# Patient Record
Sex: Female | Born: 1960 | Hispanic: Yes | Marital: Single | Smoking: Former smoker
Health system: Southern US, Community
[De-identification: ages and names within clinical notes are randomized; demographics above are authoritative.]

## PROBLEM LIST (undated history)

## (undated) DIAGNOSIS — Z9981 Dependence on supplemental oxygen: Secondary | ICD-10-CM

## (undated) DIAGNOSIS — N390 Urinary tract infection, site not specified: Secondary | ICD-10-CM

## (undated) DIAGNOSIS — J449 Chronic obstructive pulmonary disease, unspecified: Secondary | ICD-10-CM

## (undated) DIAGNOSIS — R112 Nausea with vomiting, unspecified: Secondary | ICD-10-CM

## (undated) DIAGNOSIS — I739 Peripheral vascular disease, unspecified: Secondary | ICD-10-CM

## (undated) DIAGNOSIS — R569 Unspecified convulsions: Secondary | ICD-10-CM

## (undated) DIAGNOSIS — M797 Fibromyalgia: Secondary | ICD-10-CM

## (undated) DIAGNOSIS — G8929 Other chronic pain: Secondary | ICD-10-CM

## (undated) DIAGNOSIS — Z978 Presence of other specified devices: Secondary | ICD-10-CM

## (undated) DIAGNOSIS — J8410 Pulmonary fibrosis, unspecified: Secondary | ICD-10-CM

## (undated) DIAGNOSIS — Z960 Presence of urogenital implants: Secondary | ICD-10-CM

## (undated) DIAGNOSIS — Z87442 Personal history of urinary calculi: Secondary | ICD-10-CM

## (undated) DIAGNOSIS — G43909 Migraine, unspecified, not intractable, without status migrainosus: Secondary | ICD-10-CM

## (undated) DIAGNOSIS — D649 Anemia, unspecified: Secondary | ICD-10-CM

## (undated) DIAGNOSIS — Z9889 Other specified postprocedural states: Secondary | ICD-10-CM

## (undated) DIAGNOSIS — G43019 Migraine without aura, intractable, without status migrainosus: Secondary | ICD-10-CM

## (undated) DIAGNOSIS — J189 Pneumonia, unspecified organism: Secondary | ICD-10-CM

## (undated) DIAGNOSIS — M329 Systemic lupus erythematosus, unspecified: Secondary | ICD-10-CM

## (undated) DIAGNOSIS — I509 Heart failure, unspecified: Secondary | ICD-10-CM

## (undated) DIAGNOSIS — N189 Chronic kidney disease, unspecified: Secondary | ICD-10-CM

## (undated) HISTORY — DX: Chronic obstructive pulmonary disease, unspecified: J44.9

## (undated) HISTORY — DX: Migraine without aura, intractable, without status migrainosus: G43.019

## (undated) HISTORY — PX: ABDOMINAL HYSTERECTOMY: SHX81

## (undated) HISTORY — PX: EYE SURGERY: SHX253

## (undated) HISTORY — PX: CHOLECYSTECTOMY: SHX55

## (undated) HISTORY — DX: Pulmonary fibrosis, unspecified: J84.10

---

## 2010-09-18 HISTORY — PX: OTHER SURGICAL HISTORY: SHX169

## 2013-09-18 HISTORY — PX: OTHER SURGICAL HISTORY: SHX169

## 2013-10-05 ENCOUNTER — Emergency Department (HOSPITAL_COMMUNITY): Payer: Medicaid Other

## 2013-10-05 ENCOUNTER — Inpatient Hospital Stay (HOSPITAL_COMMUNITY)
Admission: EM | Admit: 2013-10-05 | Discharge: 2013-10-10 | DRG: 871 | Disposition: A | Discharge status: Home / Self Care | Payer: Medicaid Other | Attending: Internal Medicine | Admitting: Internal Medicine

## 2013-10-05 ENCOUNTER — Other Ambulatory Visit: Payer: Self-pay

## 2013-10-05 ENCOUNTER — Encounter (HOSPITAL_COMMUNITY): Payer: Self-pay | Admitting: Emergency Medicine

## 2013-10-05 DIAGNOSIS — J15212 Pneumonia due to Methicillin resistant Staphylococcus aureus: Secondary | ICD-10-CM | POA: Diagnosis present

## 2013-10-05 DIAGNOSIS — Z87891 Personal history of nicotine dependence: Secondary | ICD-10-CM | POA: Diagnosis not present

## 2013-10-05 DIAGNOSIS — K5900 Constipation, unspecified: Secondary | ICD-10-CM | POA: Diagnosis present

## 2013-10-05 DIAGNOSIS — K219 Gastro-esophageal reflux disease without esophagitis: Secondary | ICD-10-CM | POA: Diagnosis present

## 2013-10-05 DIAGNOSIS — D72829 Elevated white blood cell count, unspecified: Secondary | ICD-10-CM | POA: Diagnosis present

## 2013-10-05 DIAGNOSIS — D638 Anemia in other chronic diseases classified elsewhere: Secondary | ICD-10-CM | POA: Diagnosis present

## 2013-10-05 DIAGNOSIS — A419 Sepsis, unspecified organism: Principal | ICD-10-CM | POA: Diagnosis present

## 2013-10-05 DIAGNOSIS — E876 Hypokalemia: Secondary | ICD-10-CM | POA: Diagnosis present

## 2013-10-05 DIAGNOSIS — I498 Other specified cardiac arrhythmias: Secondary | ICD-10-CM | POA: Diagnosis present

## 2013-10-05 DIAGNOSIS — R1310 Dysphagia, unspecified: Secondary | ICD-10-CM | POA: Diagnosis present

## 2013-10-05 DIAGNOSIS — G894 Chronic pain syndrome: Secondary | ICD-10-CM | POA: Diagnosis present

## 2013-10-05 DIAGNOSIS — R5381 Other malaise: Secondary | ICD-10-CM | POA: Diagnosis not present

## 2013-10-05 DIAGNOSIS — G8929 Other chronic pain: Secondary | ICD-10-CM

## 2013-10-05 DIAGNOSIS — Z23 Encounter for immunization: Secondary | ICD-10-CM

## 2013-10-05 DIAGNOSIS — R5383 Other fatigue: Secondary | ICD-10-CM | POA: Diagnosis present

## 2013-10-05 DIAGNOSIS — IMO0001 Reserved for inherently not codable concepts without codable children: Secondary | ICD-10-CM | POA: Diagnosis present

## 2013-10-05 DIAGNOSIS — R0902 Hypoxemia: Secondary | ICD-10-CM

## 2013-10-05 DIAGNOSIS — J9600 Acute respiratory failure, unspecified whether with hypoxia or hypercapnia: Secondary | ICD-10-CM | POA: Diagnosis present

## 2013-10-05 DIAGNOSIS — G40909 Epilepsy, unspecified, not intractable, without status epilepticus: Secondary | ICD-10-CM | POA: Diagnosis present

## 2013-10-05 DIAGNOSIS — Z79899 Other long term (current) drug therapy: Secondary | ICD-10-CM

## 2013-10-05 DIAGNOSIS — D509 Iron deficiency anemia, unspecified: Secondary | ICD-10-CM | POA: Diagnosis present

## 2013-10-05 DIAGNOSIS — J189 Pneumonia, unspecified organism: Secondary | ICD-10-CM | POA: Diagnosis present

## 2013-10-05 HISTORY — DX: Fibromyalgia: M79.7

## 2013-10-05 HISTORY — DX: Unspecified convulsions: R56.9

## 2013-10-05 HISTORY — DX: Other chronic pain: G89.29

## 2013-10-05 HISTORY — DX: Migraine, unspecified, not intractable, without status migrainosus: G43.909

## 2013-10-05 LAB — POCT I-STAT 3, ART BLOOD GAS (G3+)
Acid-base deficit: 4 mmol/L — ABNORMAL HIGH (ref 0.0–2.0)
Bicarbonate: 22.2 mEq/L (ref 20.0–24.0)
O2 SAT: 83 %
PCO2 ART: 42.7 mmHg (ref 35.0–45.0)
TCO2: 23 mmol/L (ref 0–100)
pH, Arterial: 7.324 — ABNORMAL LOW (ref 7.350–7.450)
pO2, Arterial: 51 mmHg — ABNORMAL LOW (ref 80.0–100.0)

## 2013-10-05 LAB — CBC WITH DIFFERENTIAL/PLATELET
BASOS PCT: 0 % (ref 0–1)
Basophils Absolute: 0 10*3/uL (ref 0.0–0.1)
EOS PCT: 0 % (ref 0–5)
Eosinophils Absolute: 0 10*3/uL (ref 0.0–0.7)
HCT: 34.3 % — ABNORMAL LOW (ref 36.0–46.0)
Hemoglobin: 10.9 g/dL — ABNORMAL LOW (ref 12.0–15.0)
Lymphocytes Relative: 4 % — ABNORMAL LOW (ref 12–46)
Lymphs Abs: 1.2 10*3/uL (ref 0.7–4.0)
MCH: 29.1 pg (ref 26.0–34.0)
MCHC: 31.8 g/dL (ref 30.0–36.0)
MCV: 91.7 fL (ref 78.0–100.0)
Monocytes Absolute: 1.5 10*3/uL — ABNORMAL HIGH (ref 0.1–1.0)
Monocytes Relative: 5 % (ref 3–12)
NEUTROS PCT: 91 % — AB (ref 43–77)
Neutro Abs: 25.3 10*3/uL — ABNORMAL HIGH (ref 1.7–7.7)
PLATELETS: 408 10*3/uL — AB (ref 150–400)
RBC: 3.74 MIL/uL — ABNORMAL LOW (ref 3.87–5.11)
RDW: 15.2 % (ref 11.5–15.5)
WBC: 28 10*3/uL — ABNORMAL HIGH (ref 4.0–10.5)

## 2013-10-05 LAB — POCT I-STAT TROPONIN I: Troponin i, poc: 0 ng/mL (ref 0.00–0.08)

## 2013-10-05 LAB — URINALYSIS W MICROSCOPIC + REFLEX CULTURE
Glucose, UA: NEGATIVE mg/dL
Hgb urine dipstick: NEGATIVE
KETONES UR: NEGATIVE mg/dL
LEUKOCYTES UA: NEGATIVE
NITRITE: NEGATIVE
Protein, ur: 30 mg/dL — AB
Specific Gravity, Urine: 1.024 (ref 1.005–1.030)
Urobilinogen, UA: 1 mg/dL (ref 0.0–1.0)
pH: 6 (ref 5.0–8.0)

## 2013-10-05 LAB — BASIC METABOLIC PANEL
BUN: 22 mg/dL (ref 6–23)
CALCIUM: 8.7 mg/dL (ref 8.4–10.5)
CO2: 25 mEq/L (ref 19–32)
Chloride: 101 mEq/L (ref 96–112)
Creatinine, Ser: 0.81 mg/dL (ref 0.50–1.10)
GFR, EST NON AFRICAN AMERICAN: 83 mL/min — AB (ref >90)
Glucose, Bld: 136 mg/dL — ABNORMAL HIGH (ref 70–99)
POTASSIUM: 4.6 meq/L (ref 3.7–5.3)
Sodium: 142 mEq/L (ref 137–147)

## 2013-10-05 LAB — PRO B NATRIURETIC PEPTIDE: PRO B NATRI PEPTIDE: 465.7 pg/mL — AB (ref 0–125)

## 2013-10-05 LAB — MRSA PCR SCREENING: MRSA BY PCR: NEGATIVE

## 2013-10-05 LAB — CG4 I-STAT (LACTIC ACID): LACTIC ACID, VENOUS: 2.71 mmol/L — AB (ref 0.5–2.2)

## 2013-10-05 MED ORDER — MORPHINE SULFATE 4 MG/ML IJ SOLN
4.0000 mg | INTRAMUSCULAR | Status: DC | PRN
  Administered 2013-10-05: 4 mg via INTRAVENOUS
  Filled 2013-10-05: qty 1

## 2013-10-05 MED ORDER — TRAMADOL HCL 50 MG PO TABS
50.0000 mg | ORAL_TABLET | Freq: Four times a day (QID) | ORAL | Status: DC | PRN
  Administered 2013-10-05 – 2013-10-10 (×13): 50 mg via ORAL
  Filled 2013-10-05 (×13): qty 1

## 2013-10-05 MED ORDER — NON FORMULARY: 10.0000 mg | Freq: Every day | Status: DC

## 2013-10-05 MED ORDER — PREGABALIN 50 MG PO CAPS
300.0000 mg | ORAL_CAPSULE | Freq: Three times a day (TID) | ORAL | Status: DC
  Administered 2013-10-05 – 2013-10-10 (×14): 300 mg via ORAL
  Filled 2013-10-05: qty 6
  Filled 2013-10-05: qty 12
  Filled 2013-10-05: qty 3
  Filled 2013-10-05 (×2): qty 6
  Filled 2013-10-05: qty 3
  Filled 2013-10-05: qty 6
  Filled 2013-10-05: qty 12
  Filled 2013-10-05 (×2): qty 3
  Filled 2013-10-05 (×2): qty 6
  Filled 2013-10-05 (×3): qty 3

## 2013-10-05 MED ORDER — SODIUM CHLORIDE 0.9 % IV SOLN: INTRAVENOUS | Status: DC

## 2013-10-05 MED ORDER — SODIUM CHLORIDE 0.9 % IV SOLN
INTRAVENOUS | Status: DC
  Administered 2013-10-05: 17:00:00 via INTRAVENOUS

## 2013-10-05 MED ORDER — PROMETHAZINE HCL 25 MG PO TABS: 12.5000 mg | ORAL_TABLET | Freq: Four times a day (QID) | ORAL | Status: DC | PRN

## 2013-10-05 MED ORDER — MORPHINE SULFATE 2 MG/ML IJ SOLN
1.0000 mg | INTRAMUSCULAR | Status: DC | PRN
  Administered 2013-10-05 – 2013-10-06 (×2): 1 mg via INTRAVENOUS
  Filled 2013-10-05 (×2): qty 1

## 2013-10-05 MED ORDER — FENTANYL 50 MCG/HR TD PT72
50.0000 ug | MEDICATED_PATCH | TRANSDERMAL | Status: DC
  Administered 2013-10-07 – 2013-10-10 (×2): 50 ug via TRANSDERMAL
  Filled 2013-10-05 (×2): qty 1

## 2013-10-05 MED ORDER — IPRATROPIUM BROMIDE 0.02 % IN SOLN
0.5000 mg | Freq: Once | RESPIRATORY_TRACT | Status: AC
  Administered 2013-10-05: 0.5 mg via RESPIRATORY_TRACT
  Filled 2013-10-05: qty 2.5

## 2013-10-05 MED ORDER — SODIUM CHLORIDE 0.9 % IV BOLUS (SEPSIS)
500.0000 mL | Freq: Once | INTRAVENOUS | Status: AC
  Administered 2013-10-05: 500 mL via INTRAVENOUS

## 2013-10-05 MED ORDER — HEPARIN SODIUM (PORCINE) 5000 UNIT/ML IJ SOLN
5000.0000 [IU] | Freq: Three times a day (TID) | INTRAMUSCULAR | Status: DC
  Administered 2013-10-05 – 2013-10-10 (×14): 5000 [IU] via SUBCUTANEOUS
  Filled 2013-10-05 (×17): qty 1

## 2013-10-05 MED ORDER — DOXYCYCLINE HYCLATE 100 MG PO TABS
100.0000 mg | ORAL_TABLET | Freq: Once | ORAL | Status: AC
  Administered 2013-10-05: 100 mg via ORAL
  Filled 2013-10-05: qty 1

## 2013-10-05 MED ORDER — ALBUTEROL SULFATE (2.5 MG/3ML) 0.083% IN NEBU: 2.5000 mg | INHALATION_SOLUTION | Freq: Three times a day (TID) | RESPIRATORY_TRACT | Status: DC

## 2013-10-05 MED ORDER — HYDROMORPHONE HCL PF 1 MG/ML IJ SOLN: 1.0000 mg | INTRAMUSCULAR | Status: DC | PRN

## 2013-10-05 MED ORDER — ALBUTEROL SULFATE (2.5 MG/3ML) 0.083% IN NEBU
2.5000 mg | INHALATION_SOLUTION | RESPIRATORY_TRACT | Status: DC
Start: 2013-10-05 — End: 2013-10-05
  Administered 2013-10-05: 2.5 mg via RESPIRATORY_TRACT
  Filled 2013-10-05: qty 3

## 2013-10-05 MED ORDER — DEXTROSE 5 % IV SOLN
1.0000 g | Freq: Once | INTRAVENOUS | Status: AC
  Administered 2013-10-05: 1 g via INTRAVENOUS
  Filled 2013-10-05: qty 10

## 2013-10-05 MED ORDER — SODIUM CHLORIDE 0.9 % IJ SOLN
3.0000 mL | Freq: Two times a day (BID) | INTRAMUSCULAR | Status: DC
  Administered 2013-10-06 – 2013-10-09 (×8): 3 mL via INTRAVENOUS

## 2013-10-05 MED ORDER — SODIUM CHLORIDE 0.9 % IV SOLN
INTRAVENOUS | Status: AC
  Administered 2013-10-06: 1000 mL via INTRAVENOUS

## 2013-10-05 MED ORDER — RIZATRIPTAN BENZOATE 10 MG PO TABS
10.0000 mg | ORAL_TABLET | Freq: Every day | ORAL | Status: DC | PRN
  Filled 2013-10-05: qty 1

## 2013-10-05 MED ORDER — RIZATRIPTAN BENZOATE 10 MG PO TBDP
10.0000 mg | ORAL_TABLET | Freq: Every day | ORAL | Status: DC | PRN
  Administered 2013-10-07: 10 mg via ORAL
  Filled 2013-10-05 (×4): qty 1

## 2013-10-05 MED ORDER — ALBUTEROL SULFATE (2.5 MG/3ML) 0.083% IN NEBU
5.0000 mg | INHALATION_SOLUTION | Freq: Once | RESPIRATORY_TRACT | Status: AC
  Administered 2013-10-05: 5 mg via RESPIRATORY_TRACT
  Filled 2013-10-05: qty 6

## 2013-10-05 MED ORDER — ALBUTEROL SULFATE (2.5 MG/3ML) 0.083% IN NEBU
2.5000 mg | INHALATION_SOLUTION | RESPIRATORY_TRACT | Status: DC | PRN
  Administered 2013-10-05 – 2013-10-06 (×2): 2.5 mg via RESPIRATORY_TRACT
  Filled 2013-10-05 (×2): qty 3

## 2013-10-05 NOTE — ED Notes (Signed)
Patient refused to stand for orthostatics, states she can not stand due to weakness that is why she is here.

## 2013-10-05 NOTE — ED Notes (Signed)
NOTIFIED DR. Clarene DukeMCMANUS IN PERSON OF PATIENTS LAB RESULTS OF CG4+ LACTIC ACID ,@16 :38 PM ,07/05/2014.

## 2013-10-05 NOTE — ED Notes (Signed)
Patient presents to ED via EMS from urgent care for fibromyalgia pain.

## 2013-10-05 NOTE — ED Provider Notes (Signed)
CSN: [id number]     Arrival date & time 10/05/13  1423 History   First MD Initiated Contact with Patient 10/05/13 1508     Chief Complaint  Patient presents with  . Fibromyalgia  . Generalized Body Aches    HPI Pt was seen at 1520. Per pt and her family, c/o gradual onset and worsening of persistent generalized body aches for the past 3 days. Pt states she has had "chills," with intermittent home temps to "100.2" and coughing.  States she has a significant hx of fibromyalgia, takes multiple meds for pain but "they aren't working." Denies any specific complaints otherwise. Pt was seen at a local [facility] then sent to the ED for further eval. Denies CP/palpitations, no SOB, no abd pain, no N/V/D, no sore throat, no neck pain, no rash, no focal motor weakness, no tingling/numbness in extremities.    Past Medical History  Diagnosis Date  . Fibromyalgia   . Chronic pain    History reviewed. No pertinent past surgical history.  History  Substance Use Topics  . Smoking status: Never Smoker   . Smokeless tobacco: Not on file  . Alcohol Use: No    Review of Systems ROS: Statement: All systems negative except as marked or noted in the HPI; Constitutional: Negative for fever and +chills, generalized body aches. ; ; Eyes: Negative for eye pain, redness and discharge. ; ; ENMT: Negative for ear pain, hoarseness, nasal congestion, sinus pressure and sore throat. ; ; Cardiovascular: Negative for chest pain, palpitations, diaphoresis, dyspnea and peripheral edema. ; ; Respiratory: +cough. Negative for wheezing and stridor. ; ; Gastrointestinal: Negative for nausea, vomiting, diarrhea, abdominal pain, blood in stool, hematemesis, jaundice and rectal bleeding. . ; ; Genitourinary: Negative for dysuria, flank pain and hematuria. ; ; Musculoskeletal: Negative for back pain and neck pain. Negative for swelling and trauma.; ; Skin: Negative for pruritus, rash, abrasions, blisters, bruising and skin lesion.; ;  Neuro: Negative for headache, lightheadedness and neck stiffness. Negative for weakness, altered level of consciousness , altered mental status, extremity weakness, paresthesias, involuntary movement, seizure and syncope.     Allergies  Shellfish allergy  Home Medications   Current Outpatient Rx  Name  Route  Sig  Dispense  Refill  . etodolac (LODINE) 500 MG tablet   Oral   Take 500 mg by mouth 3 (three) times daily.         . fentaNYL (DURAGESIC - DOSED MCG/HR) 50 MCG/HR   Transdermal   Place 50 mcg onto the skin every 3 (three) days.         . Oxcarbazepine (TRILEPTAL) 300 MG tablet   Oral   Take 300 mg by mouth 2 (two) times daily.         . pregabalin (LYRICA) 300 MG capsule   Oral   Take 300 mg by mouth 3 (three) times daily.         . rizatriptan (MAXALT) 10 MG tablet   Oral   Take 10 mg by mouth daily as needed for migraine. May repeat in 2 hours if needed         . traMADol (ULTRAM) 50 MG tablet   Oral   Take 50 mg by mouth every 6 (six) hours as needed for moderate pain.          BP 92/56  Pulse 103  Temp(Src) 98.4 F (36.9 C) (Oral)  Resp 14  SpO2 91% Physical Exam 1525: Physical examination:  Nursing notes reviewed;  Vital signs and O2 SAT reviewed;  Constitutional: Well developed, Well nourished, Well hydrated, In no acute distress; Head:  Normocephalic, atraumatic; Eyes: EOMI, PERRL, No scleral icterus; ENMT: Mouth and pharynx normal, Mucous membranes moist; Neck: Supple, Full range of motion, No lymphadenopathy; Cardiovascular: Regular rate and rhythm, No gallop; Respiratory: Breath sounds coarse & equal bilaterally, No wheezes.  Speaking full sentences with ease, Normal respiratory effort/excursion; Chest: Nontender, Movement normal; Abdomen: Soft, Nontender, Nondistended, Normal bowel sounds; Genitourinary: No CVA tenderness; Extremities: Pulses normal, No tenderness, No edema, No calf edema or asymmetry.; Neuro: AA&Ox3, Major CN grossly intact.  Speech clear. No gross focal motor or sensory deficits in extremities.; Skin: Color normal, Warm, Dry.   ED Course  Procedures    EKG Interpretation    Date/Time:  Sunday October 05 2013 16:50:25 EST Ventricular Rate:  100 PR Interval:  137 QRS Duration: 78 QT Interval:  397 QTC Calculation: 512 R Axis:   80 Text Interpretation:  Sinus tachycardia Borderline T wave abnormalities Prolonged QT interval No old tracing to compare Confirmed by [facility] SystemMCCMANUS  MD, Nicholos JohnsKATHLEEN [phone]) on 10/05/2013 5:06:27 PM            MDM  MDM Reviewed: previous chart, nursing note and vitals Interpretation: labs and x-ray   Results for orders placed during the hospital encounter of 10/05/13  URINALYSIS W MICROSCOPIC + REFLEX CULTURE      Result Value Range   Color, Urine YELLOW  YELLOW   APPearance CLEAR  CLEAR   Specific Gravity, Urine 1.024  1.005 - 1.030   pH 6.0  5.0 - 8.0   Glucose, UA NEGATIVE  NEGATIVE mg/dL   Hgb urine dipstick NEGATIVE  NEGATIVE   Bilirubin Urine LARGE (*) NEGATIVE   Ketones, ur NEGATIVE  NEGATIVE mg/dL   Protein, ur 30 (*) NEGATIVE mg/dL   Urobilinogen, UA 1.0  0.0 - 1.0 mg/dL   Nitrite NEGATIVE  NEGATIVE   Leukocytes, UA NEGATIVE  NEGATIVE   RBC / HPF 7-10  <3 RBC/hpf  CBC WITH DIFFERENTIAL      Result Value Range   WBC 28.0 (*) 4.0 - 10.5 K/uL   RBC 3.74 (*) 3.87 - 5.11 MIL/uL   Hemoglobin 10.9 (*) 12.0 - 15.0 g/dL   HCT 96.034.3 (*) 45.436.0 - 09.846.0 %   MCV 91.7  78.0 - 100.0 fL   MCH 29.1  26.0 - 34.0 pg   MCHC 31.8  30.0 - 36.0 g/dL   RDW 11.915.2  14.711.5 - 82.915.5 %   Platelets 408 (*) 150 - 400 K/uL   Neutrophils Relative % 91 (*) 43 - 77 %   Neutro Abs 25.3 (*) 1.7 - 7.7 K/uL   Lymphocytes Relative 4 (*) 12 - 46 %   Lymphs Abs 1.2  0.7 - 4.0 K/uL   Monocytes Relative 5  3 - 12 %   Monocytes Absolute 1.5 (*) 0.1 - 1.0 K/uL   Eosinophils Relative 0  0 - 5 %   Eosinophils Absolute 0.0  0.0 - 0.7 K/uL   Basophils Relative 0  0 - 1 %   Basophils Absolute 0.0  0.0 - 0.1  K/uL  BASIC METABOLIC PANEL      Result Value Range   Sodium 142  137 - 147 mEq/L   Potassium 4.6  3.7 - 5.3 mEq/L   Chloride 101  96 - 112 mEq/L   CO2 25  19 - 32 mEq/L   Glucose, Bld 136 (*) 70 - 99 mg/dL  BUN 22  6 - 23 mg/dL   Creatinine, Ser 6.57  0.50 - 1.10 mg/dL   Calcium 8.7  8.4 - 84.6 mg/dL   GFR calc non Af Amer 83 (*) >90 mL/min   GFR calc Af Amer >90  >90 mL/min  POCT I-STAT TROPONIN I      Result Value Range   Troponin i, poc 0.00  0.00 - 0.08 ng/mL   Comment 3           CG4 I-STAT (LACTIC ACID)      Result Value Range   Lactic Acid, Venous 2.71 (*) 0.5 - 2.2 mmol/L   Dg Chest 2 View 10/05/2013   CLINICAL DATA:  History of chest pain for 48 hr. And arthritis and fibroma myalgia  EXAM: CHEST  2 VIEW  COMPARISON:  None.  FINDINGS: The lungs are well-expanded. There are fluffy increased lung markings bilaterally but most conspicuously in the right upper lobe. The cardiopericardial silhouette is normal in size. The pulmonary vascularity is not engorged. There is no pleural effusion or pneumothorax. The mediastinum is normal in width. The observed portions of the bony thorax exhibit no acute abnormalities.  IMPRESSION: Increased interstitial and alveolar densities bilaterally but predominantly on the right are consistent with pneumonia. Certainly other alveolar filling process ease could be a present. Follow-up chest films or chest CT scanning following therapy will be needed to assure clearing.   Electronically Signed   By: David  [place]   On: 10/05/2013 16:33     1745:  Prolonged QT on EKG; will dose IV rocephin and doxycycline for CAP. O2 Sat 88% R/A, increases to 95% on O2 2.5L N/C. Remains afebrile. SBP dropped to 90's after IV morphine, will give IVF bolus. Pt continues to mentate per baseline, resps without distress. Dx and testing d/w pt and family.  Questions answered.  Verb understanding, agreeable to admit. T/C to [facility] Resident, case discussed, including:  HPI, pertinent  PM/SHx, VS/PE, dx testing, ED course and treatment:  Agreeable to admit, requests to write temporary orders, obtain stepdown bed to team Dr. Meredith Pel' service.      Laray Anger, DO 10/06/13 1249

## 2013-10-05 NOTE — H&P (Signed)
Date: 10/05/2013               Patient Name:  [name] [name] MRN: [id number]  DOB: 05/29/1961 Age / Sex: 53 y.o., female   PCP: No Pcp Per Patient         Medical Service: Internal Medicine Teaching Service         Attending Physician: Dr. Farley LyJerry Dale Joines, MD    First Contact: Dr. Otis BraceMarjan Rabbani, MD Pager: [phone]  Second Contact: Dr. Darden PalmerSamaya Qureshi, MD Pager: [phone]       After Hours (After 5p/  First Contact Pager: [phone]  weekends / holidays): Second Contact Pager: [phone]   Chief Complaint: weakness  History of Present Illness: [name] [name] is a 53 y.o. woman with a pmhx of chronic pain(fibromyalgia) who presented to the ED with a cc of body pain and weakness. The patient was in her normal state of health until last week when she developed body aches. As the patient has chronic pain and has been diagnosed with fibromyalgia, she has experience with body aches. However, these body aches were much different than previous chronic pain. She states that the pain was all over her body. She admits to associated symptoms of fevers and chills for the last 4 days. She states that she developed a productive cough 3 days ago that has been continuous since that time. Over the last two days she has had increasing weakness and fatigue. Since then she admits to eating less due to anorexia. She is tolerating food and water with no nausea, vomitting, or diarrhea.    Of note, she admits to multiple weeks of bilateral LE swelling.  Meds: Current Facility-Administered Medications  Medication Dose Route Frequency Provider Last Rate Last Dose  . 0.9 %  sodium chloride infusion   Intravenous Continuous Laray AngerKathleen M McManus, DO 999 mL/hr at 10/05/13 1635    . morphine 4 MG/ML injection 4 mg  4 mg Intravenous Q1H PRN Laray AngerKathleen M McManus, DO   4 mg at 10/05/13 1642   Current Outpatient Prescriptions  Medication Sig Dispense Refill  . etodolac (LODINE) 500 MG tablet Take 500 mg by mouth 3 (three) times  daily.      . fentaNYL (DURAGESIC - DOSED MCG/HR) 50 MCG/HR Place 50 mcg onto the skin every 3 (three) days.      . Oxcarbazepine (TRILEPTAL) 300 MG tablet Take 300 mg by mouth 2 (two) times daily.      . pregabalin (LYRICA) 300 MG capsule Take 300 mg by mouth 3 (three) times daily.      . rizatriptan (MAXALT) 10 MG tablet Take 10 mg by mouth daily as needed for migraine. May repeat in 2 hours if needed      . traMADol (ULTRAM) 50 MG tablet Take 50 mg by mouth every 6 (six) hours as needed for moderate pain.        Allergies: Allergies as of 10/05/2013 - Review Complete 10/05/2013  Allergen Reaction Noted  . Shellfish allergy Anaphylaxis 10/05/2013   Past Medical History  Diagnosis Date  . Fibromyalgia   . Chronic pain   . Seizures   . Migraine    History reviewed. No pertinent past surgical history. History reviewed. No pertinent family history. History   Social History  . Marital Status: Single    Spouse Name: N/A    Number of Children: N/A  . Years of Education: N/A   Occupational History  . Not on file.   Social History Main Topics  .  Smoking status: Never Smoker   . Smokeless tobacco: Not on file  . Alcohol Use: No  . Drug Use: No  . Sexual Activity: Not on file   Other Topics Concern  . Not on file   Social History Narrative  . No narrative on file    Review of Systems: Review of Systems  Constitutional: Positive for fever, chills and malaise/fatigue.  HENT: Negative for sore throat.   Eyes: Negative for blurred vision and double vision.  Respiratory: Positive for cough and sputum production. Negative for hemoptysis, shortness of breath and wheezing.   Cardiovascular: Positive for leg swelling. Negative for chest pain, palpitations, orthopnea and PND.  Gastrointestinal: Positive for heartburn. Negative for nausea, vomiting, abdominal pain, diarrhea, blood in stool and melena.  Genitourinary: Positive for dysuria and urgency. Negative for frequency,  hematuria and flank pain.  Musculoskeletal: Positive for back pain, joint pain and myalgias.  Neurological: Positive for weakness. Negative for dizziness, tingling, focal weakness, seizures, loss of consciousness and headaches.  Psychiatric/Behavioral: Positive for depression.     Physical Exam: Blood pressure 102/57, pulse 96, temperature 98.4 F (36.9 C), temperature source Oral, resp. rate 20, SpO2 92.00%. Physical Exam  Constitutional: She is oriented to person, place, and time. She appears well-developed and well-nourished. No distress.  HENT:  Head: Normocephalic and atraumatic.  Neck: Neck supple. No JVD present. No tracheal deviation present. No thyromegaly present.  Cardiovascular: Normal rate, regular rhythm, normal heart sounds and intact distal pulses.  Exam reveals no gallop and no friction rub.   No murmur heard. Pulmonary/Chest: Effort normal. No respiratory distress. She has no wheezes. She has rales. She exhibits no tenderness.  Abdominal: Soft. Bowel sounds are normal. She exhibits no distension. There is tenderness. There is guarding. There is no rebound.  Neurological: She is alert and oriented to person, place, and time.  Skin: She is not diaphoretic.  Psychiatric:  Depressed women who perseverates on her pain. Difficult to acquire historical information on other topics because everything seems to relate to her pain.   Lab results: Basic Metabolic Panel:  Recent Labs  40/98/11 1550  NA 142  K 4.6  CL 101  CO2 25  GLUCOSE 136*  BUN 22  CREATININE 0.81  CALCIUM 8.7   CBC:  Recent Labs  10/05/13 1550  WBC 28.0*  NEUTROABS 25.3*  HGB 10.9*  HCT 34.3*  MCV 91.7  PLT 408*   Urinalysis:  Recent Labs  10/05/13 1645  COLORURINE YELLOW  LABSPEC 1.024  PHURINE 6.0  GLUCOSEU NEGATIVE  HGBUR NEGATIVE  BILIRUBINUR LARGE*  KETONESUR NEGATIVE  PROTEINUR 30*  UROBILINOGEN 1.0  NITRITE NEGATIVE  LEUKOCYTESUR NEGATIVE   Misc. Labs: ABG:  PH=  7.324 PCO2= 42.7 PO2= 51.0 Bicarb=22.2 O2= 83%  Imaging results:  Dg Chest 2 View  10/05/2013   CLINICAL DATA:  History of chest pain for 48 hr. And arthritis and fibroma myalgia  EXAM: CHEST  2 VIEW  COMPARISON:  None.  FINDINGS: The lungs are well-expanded. There are fluffy increased lung markings bilaterally but most conspicuously in the right upper lobe. The cardiopericardial silhouette is normal in size. The pulmonary vascularity is not engorged. There is no pleural effusion or pneumothorax. The mediastinum is normal in width. The observed portions of the bony thorax exhibit no acute abnormalities.  IMPRESSION: Increased interstitial and alveolar densities bilaterally but predominantly on the right are consistent with pneumonia. Certainly other alveolar filling process ease could be a present. Follow-up chest films or chest  CT scanning following therapy will be needed to assure clearing.   Electronically Signed   By: David  [place]   On: 10/05/2013 16:33    Other results: EKG: there are no previous tracings available for comparison, nonspecific ST and T waves changes, prolonged QT interval.    Assessment & Plan by Problem: Active Problems:   CAP (community acquired pneumonia)   Sepsis  Acute respiratory failure with hypoxia complicated by sepsis(Tachycardic, hypoxic, leukocytosis, CXR concerning for PNA)  The patient likely has CAP complicated by sepsis but may alternatively have pulmonary edema due to CHF or ARDS due to an unknown cause. A-A gradient 45.4. PaO2/FiO2 ratio is 243 compatible with mild ARDS. This may be due to a viral, bacterial, or combined infection. It is also possible due to pancreatitis given epigastric abdominal pain and anorexia. I am also concerned that the patient may have volume overload due to CHF as she reports 2 months of LE edema. - Rocephin and Doxy for CAP in ED - Will add vancomycin for flu superimposed with MRSA PNA - Titrate O2 to O2 greater than -  duonebs q 4 prn - F/U ProBNP and lipase - Repeat ABG as it may have been venous (83% psO2 by ABG while 97% by pulse ox) - Check HIV Ab - F/U Flu swab - F/U Blood Cultrues x2 and Urine Culture -F/U Echocardiogram  Prolonged QT Patient has prolonged QT of unknown etiology. Will repeated EKG in am and avoid QT prolonging meds.  Seizure Disorder Patient reports a history of seizures that appears stable. She is treated with oxcarbazepine 300 mg twice daily. Will continue home med.   Chronic Pain Patient has chronic pain syndrome and fibromyalgia. This has likely been made worse with recent infection. The patient become hypotensive after receiving 4 mg IV morphine in the ED. The patients home regimen is fentanyl patch (50 mcg) every 3 days, tramadol 50 mg every six hours, etodolac 500 mg three times daily, and pregabalin 300 mg three times daily. She also takes rizatriptan 10 mg daily as needed for migraines. Plan to continue home meds. Will minimize IV narcotics given hypotension in ED. - 1 mg IV morphine prn - promethazine 12.5 mg q 6 prn  Dispo: Disposition is deferred at this time, awaiting improvement of current medical problems. Anticipated discharge in approximately 2-3 day(s).   The patient does not have a current PCP (No Pcp Per Patient) and does need an [facility] hospital follow-up appointment after discharge.  The patient does not have transportation limitations that hinder transportation to clinic appointments.  Signed: Pleas Koch, MD 10/05/2013, 6:59 PM

## 2013-10-05 NOTE — Progress Notes (Signed)
PHARMACY - RENAL ADJUSTMENT OF ANTIBIOTICS  Patient has an order for ceftriaxone and doxycycline for CAP which do not need to be adjusted for renal function.  Pharmacy signing off.  CongersJennifer Millers[place], 17[address] Rainbow BoulevardPh[address].D., BCPS Clinical Pharmacist Pager: [phone] 10/05/2013 5:14 PM

## 2013-10-05 NOTE — ED Notes (Signed)
Attempted report 

## 2013-10-05 NOTE — Progress Notes (Signed)
Patient received to room 3's bed#11 from ED. Patient was able to slide over to our bed. Patient settled and oriented to unit. Patient states that she does fall. Patient has a fentanyl patch on her rt. Arm .

## 2013-10-06 ENCOUNTER — Encounter (HOSPITAL_COMMUNITY): Payer: Self-pay | Admitting: *Deleted

## 2013-10-06 DIAGNOSIS — R0902 Hypoxemia: Secondary | ICD-10-CM

## 2013-10-06 DIAGNOSIS — K5900 Constipation, unspecified: Secondary | ICD-10-CM

## 2013-10-06 DIAGNOSIS — R0989 Other specified symptoms and signs involving the circulatory and respiratory systems: Secondary | ICD-10-CM

## 2013-10-06 DIAGNOSIS — IMO0001 Reserved for inherently not codable concepts without codable children: Secondary | ICD-10-CM

## 2013-10-06 DIAGNOSIS — R0609 Other forms of dyspnea: Secondary | ICD-10-CM

## 2013-10-06 DIAGNOSIS — G894 Chronic pain syndrome: Secondary | ICD-10-CM

## 2013-10-06 DIAGNOSIS — R1013 Epigastric pain: Secondary | ICD-10-CM

## 2013-10-06 DIAGNOSIS — D72829 Elevated white blood cell count, unspecified: Secondary | ICD-10-CM

## 2013-10-06 DIAGNOSIS — D649 Anemia, unspecified: Secondary | ICD-10-CM

## 2013-10-06 LAB — URINALYSIS, ROUTINE W REFLEX MICROSCOPIC
Glucose, UA: NEGATIVE mg/dL
Hgb urine dipstick: NEGATIVE
Ketones, ur: NEGATIVE mg/dL
Nitrite: NEGATIVE
Protein, ur: NEGATIVE mg/dL
Specific Gravity, Urine: 1.021 (ref 1.005–1.030)
Urobilinogen, UA: 1 mg/dL (ref 0.0–1.0)
pH: 6 (ref 5.0–8.0)

## 2013-10-06 LAB — BLOOD GAS, ARTERIAL
Acid-base deficit: 1.3 mmol/L (ref 0.0–2.0)
Bicarbonate: 23.6 mEq/L (ref 20.0–24.0)
DRAWN BY: 36277
FIO2: 0.4 %
O2 SAT: 91.6 %
PO2 ART: 65.7 mmHg — AB (ref 80.0–100.0)
Patient temperature: 99.4
TCO2: 25 mmol/L (ref 0–100)
pCO2 arterial: 45.5 mmHg — ABNORMAL HIGH (ref 35.0–45.0)
pH, Arterial: 7.338 — ABNORMAL LOW (ref 7.350–7.450)

## 2013-10-06 LAB — LEGIONELLA ANTIGEN, URINE: Legionella Antigen, Urine: NEGATIVE

## 2013-10-06 LAB — CBC
HCT: 31.5 % — ABNORMAL LOW (ref 36.0–46.0)
HEMOGLOBIN: 10.5 g/dL — AB (ref 12.0–15.0)
MCH: 29.4 pg (ref 26.0–34.0)
MCHC: 33.3 g/dL (ref 30.0–36.0)
MCV: 88.2 fL (ref 78.0–100.0)
Platelets: 374 10*3/uL (ref 150–400)
RBC: 3.57 MIL/uL — ABNORMAL LOW (ref 3.87–5.11)
RDW: 14.7 % (ref 11.5–15.5)
WBC: 18.9 10*3/uL — ABNORMAL HIGH (ref 4.0–10.5)

## 2013-10-06 LAB — INFLUENZA PANEL BY PCR (TYPE A & B)
H1N1FLUPCR: NOT DETECTED
INFLBPCR: NEGATIVE
Influenza A By PCR: NEGATIVE

## 2013-10-06 LAB — LIPASE, BLOOD: Lipase: 9 U/L — ABNORMAL LOW (ref 11–59)

## 2013-10-06 LAB — URINE MICROSCOPIC-ADD ON

## 2013-10-06 LAB — HIV ANTIBODY (ROUTINE TESTING W REFLEX): HIV: NONREACTIVE

## 2013-10-06 LAB — STREP PNEUMONIAE URINARY ANTIGEN: STREP PNEUMO URINARY ANTIGEN: NEGATIVE

## 2013-10-06 MED ORDER — LEVALBUTEROL HCL 0.63 MG/3ML IN NEBU
0.6300 mg | INHALATION_SOLUTION | RESPIRATORY_TRACT | Status: DC
  Administered 2013-10-06 – 2013-10-07 (×6): 0.63 mg via RESPIRATORY_TRACT
  Filled 2013-10-06 (×13): qty 3

## 2013-10-06 MED ORDER — OXCARBAZEPINE 300 MG PO TABS
300.0000 mg | ORAL_TABLET | Freq: Two times a day (BID) | ORAL | Status: DC
  Administered 2013-10-06 – 2013-10-10 (×9): 300 mg via ORAL
  Filled 2013-10-06 (×11): qty 1

## 2013-10-06 MED ORDER — VANCOMYCIN HCL IN DEXTROSE 750-5 MG/150ML-% IV SOLN
750.0000 mg | Freq: Once | INTRAVENOUS | Status: AC
  Administered 2013-10-06: 750 mg via INTRAVENOUS
  Filled 2013-10-06: qty 150

## 2013-10-06 MED ORDER — PANTOPRAZOLE SODIUM 40 MG PO TBEC
40.0000 mg | DELAYED_RELEASE_TABLET | Freq: Every day | ORAL | Status: DC
  Administered 2013-10-06 – 2013-10-10 (×5): 40 mg via ORAL
  Filled 2013-10-06 (×4): qty 1

## 2013-10-06 MED ORDER — OSELTAMIVIR PHOSPHATE 75 MG PO CAPS
75.0000 mg | ORAL_CAPSULE | Freq: Two times a day (BID) | ORAL | Status: DC
  Administered 2013-10-06: 75 mg via ORAL
  Filled 2013-10-06 (×2): qty 1

## 2013-10-06 MED ORDER — DEXTROSE 5 % IV SOLN
1.0000 g | INTRAVENOUS | Status: DC
  Filled 2013-10-06: qty 10

## 2013-10-06 MED ORDER — OSELTAMIVIR PHOSPHATE 75 MG PO CAPS
75.0000 mg | ORAL_CAPSULE | Freq: Every day | ORAL | Status: DC
  Filled 2013-10-06: qty 1

## 2013-10-06 MED ORDER — IPRATROPIUM BROMIDE 0.02 % IN SOLN
RESPIRATORY_TRACT | Status: AC
  Filled 2013-10-06: qty 2.5

## 2013-10-06 MED ORDER — ACETAMINOPHEN 325 MG PO TABS
650.0000 mg | ORAL_TABLET | Freq: Four times a day (QID) | ORAL | Status: DC | PRN
  Administered 2013-10-06 – 2013-10-10 (×2): 650 mg via ORAL
  Filled 2013-10-06 (×3): qty 2

## 2013-10-06 MED ORDER — INFLUENZA VAC SPLIT QUAD 0.5 ML IM SUSP
0.5000 mL | INTRAMUSCULAR | Status: AC
  Administered 2013-10-08: 0.5 mL via INTRAMUSCULAR
  Filled 2013-10-06 (×2): qty 0.5

## 2013-10-06 MED ORDER — POLYETHYLENE GLYCOL 3350 17 G PO PACK
17.0000 g | PACK | Freq: Every day | ORAL | Status: DC | PRN
Start: 2013-10-06 — End: 2013-10-10
  Administered 2013-10-06: 17 g via ORAL
  Filled 2013-10-06: qty 1

## 2013-10-06 MED ORDER — VANCOMYCIN HCL IN DEXTROSE 750-5 MG/150ML-% IV SOLN
750.0000 mg | Freq: Two times a day (BID) | INTRAVENOUS | Status: DC
  Administered 2013-10-06: 750 mg via INTRAVENOUS
  Filled 2013-10-06: qty 150

## 2013-10-06 MED ORDER — LEVOFLOXACIN IN D5W 750 MG/150ML IV SOLN
750.0000 mg | INTRAVENOUS | Status: DC
  Administered 2013-10-06 – 2013-10-07 (×2): 750 mg via INTRAVENOUS
  Filled 2013-10-06 (×3): qty 150

## 2013-10-06 MED ORDER — PNEUMOCOCCAL VAC POLYVALENT 25 MCG/0.5ML IJ INJ
0.5000 mL | INJECTION | INTRAMUSCULAR | Status: AC | PRN
  Administered 2013-10-10: 0.5 mL via INTRAMUSCULAR
  Filled 2013-10-06 (×2): qty 0.5

## 2013-10-06 MED ORDER — IPRATROPIUM BROMIDE 0.02 % IN SOLN
0.5000 mg | RESPIRATORY_TRACT | Status: DC
  Administered 2013-10-06 – 2013-10-07 (×6): 0.5 mg via RESPIRATORY_TRACT
  Filled 2013-10-06 (×6): qty 2.5

## 2013-10-06 MED ORDER — DOCUSATE SODIUM 100 MG PO CAPS: 100.0000 mg | ORAL_CAPSULE | Freq: Every day | ORAL | Status: DC | PRN

## 2013-10-06 NOTE — Progress Notes (Addendum)
Subjective:  Pt seen and examined in AM. Pt reports she could not breathe well last night, however today improved. Pt still with cough and body aches. She denies fever, chills, chest pain, nausea, vomiting, abdominal pain, or change in urination. She reports constipation and epigastric pain (chronic).    Objective: Vital signs in last 24 hours: Filed Vitals:   10/06/13 0357 10/06/13 0530 10/06/13 0656 10/06/13 0700  BP: 116/67  110/66 110/66  Pulse: 115  99   Temp: 101.4 F (38.6 C) 100.5 F (38.1 C)  98.9 F (37.2 C)  TempSrc: Oral   Oral  Resp: 29  21   Height:      Weight:      SpO2: 99%  95%    Weight change:   Intake/Output Summary (Last 24 hours) at 10/06/13 1130 Last data filed at 10/06/13 0600  Gross per 24 hour  Intake   1000 ml  Output    750 ml  Net    250 ml   Physical Exam  Constitutional: She is oriented to person, place, and time. She appears well-developed and well-nourished. No distress.  HENT:  Head: Normocephalic and atraumatic.  Neck: Neck supple.   Cardiovascular: Normal rate, regular rhythm, normal heart sounds. No murmur heard.  Pulmonary/Chest: Breathing on venturi mask. She has no wheezes. She has crackles at bases  R>L. She exhibits no tenderness.  Abdominal: Soft. Bowel sounds are normal. She exhibits no distension. There is epigastric tenderness. There is no rebound.  Neurological: She is alert and oriented to person, place, and time.  Skin: She is not diaphoretic.  Psychiatric:    Lab Results: Basic Metabolic Panel:  Recent Labs Lab 10/05/13 1550  NA 142  K 4.6  CL 101  CO2 25  GLUCOSE 136*  BUN 22  CREATININE 0.81  CALCIUM 8.7   Liver Function Tests: No results found for this basename: AST, ALT, ALKPHOS, BILITOT, PROT, ALBUMIN,  in the last 168 hours  Recent Labs Lab 10/06/13 0025  LIPASE 9*   No results found for this basename: AMMONIA,  in the last 168 hours CBC:  Recent Labs Lab 10/05/13 1550  WBC 28.0*    NEUTROABS 25.3*  HGB 10.9*  HCT 34.3*  MCV 91.7  PLT 408*   Cardiac Enzymes: No results found for this basename: CKTOTAL, CKMB, CKMBINDEX, TROPONINI,  in the last 168 hours BNP:  Recent Labs Lab 10/05/13 2108  PROBNP 465.7*   D-Dimer: No results found for this basename: DDIMER,  in the last 168 hours CBG: No results found for this basename: GLUCAP,  in the last 168 hours Hemoglobin A1C: No results found for this basename: HGBA1C,  in the last 168 hours Fasting Lipid Panel: No results found for this basename: CHOL, HDL, LDLCALC, TRIG, CHOLHDL, LDLDIRECT,  in the last 168 hours Thyroid Function Tests: No results found for this basename: TSH, T4TOTAL, FREET4, T3FREE, THYROIDAB,  in the last 168 hours Coagulation: No results found for this basename: LABPROT, INR,  in the last 168 hours Anemia Panel: No results found for this basename: VITAMINB12, FOLATE, FERRITIN, TIBC, IRON, RETICCTPCT,  in the last 168 hours Urine Drug Screen: Drugs of Abuse  No results found for this basename: labopia, cocainscrnur, labbenz, amphetmu, thcu, labbarb    Alcohol Level: No results found for this basename: ETH,  in the last 168 hours Urinalysis:  Recent Labs Lab 10/05/13 1645 10/05/13 2345  COLORURINE YELLOW YELLOW  LABSPEC 1.024 1.021  PHURINE 6.0 6.0  GLUCOSEU NEGATIVE NEGATIVE  HGBUR NEGATIVE NEGATIVE  BILIRUBINUR LARGE* LARGE*  KETONESUR NEGATIVE NEGATIVE  PROTEINUR 30* NEGATIVE  UROBILINOGEN 1.0 1.0  NITRITE NEGATIVE NEGATIVE  LEUKOCYTESUR NEGATIVE MODERATE*   Micro Results: Recent Results (from the past 240 hour(s))  MRSA PCR SCREENING     Status: None   Collection Time    10/05/13  7:36 PM      Result Value Range Status   MRSA by PCR NEGATIVE  NEGATIVE Final   Comment:            The GeneXpert MRSA Assay (FDA     approved for NASAL specimens     only), is one component of a     comprehensive MRSA colonization     surveillance program. It is not     intended to  diagnose MRSA     infection nor to guide or     monitor treatment for     MRSA infections.   Studies/Results: Dg Chest 2 View  10/05/2013   CLINICAL DATA:  History of chest pain for 48 hr. And arthritis and fibroma myalgia  EXAM: CHEST  2 VIEW  COMPARISON:  None.  FINDINGS: The lungs are well-expanded. There are fluffy increased lung markings bilaterally but most conspicuously in the right upper lobe. The cardiopericardial silhouette is normal in size. The pulmonary vascularity is not engorged. There is no pleural effusion or pneumothorax. The mediastinum is normal in width. The observed portions of the bony thorax exhibit no acute abnormalities.  IMPRESSION: Increased interstitial and alveolar densities bilaterally but predominantly on the right are consistent with pneumonia. Certainly other alveolar filling process ease could be a present. Follow-up chest films or chest CT scanning following therapy will be needed to assure clearing.   Electronically Signed   By: David  [place]   On: 10/05/2013 16:33   Medications: I have reviewed the patient's current medications. Scheduled Meds: . [START ON 10/07/2013] fentaNYL  50 mcg Transdermal Q72H  . heparin  5,000 Units Subcutaneous Q8H  . [START ON 10/07/2013] influenza vac split quadrivalent PF  0.5 mL Intramuscular Tomorrow-1000  . oseltamivir  75 mg Oral BID  . pregabalin  300 mg Oral TID  . sodium chloride  3 mL Intravenous Q12H  . vancomycin  750 mg Intravenous Q12H   Continuous Infusions:  PRN Meds:.acetaminophen, albuterol, promethazine, rizatriptan, traMADol Assessment/Plan:  Assessment: 53 year old woman with past medical history of who presented on 1/18 with sepsis and found to have community acquired pneumonia complicated by acute respiratory failure.   Plan:   Community Acquired Pneumonia complicated by hypoxia  - No longer septic, currently 99% O2 on venturi mask 40% FiO2. Chest xray on 1/18 with increased interstitial and alveolar  densities bilaterally, predominantly on the right consistent with pneumonia. Pt was influenza, HIV,  legionella, and strep pneuomonaie negative.  -Oxygen therapy to keep SpO2 >92%, wean as tolerated -Change IV Vancomycin and doxycyline to IV levofloxacin 750 daily (total Day 2 antibiotics), transition to PO with improvement in respiratory status -Start breathing treatment Q4 hr (ipatropium and levalbuterol)  -Acetaminphen PRN fever -Blood cultures x2 in process -Obtain gram stain and culture -Obtain repeat ABG --> improved hypoxia 51 to 65.7 (on 40% venturi mask) -Continue to monitor WBC---> improving leukocytosis -Follow-up imaging in 4-6 weeks for resolution  Leukocytosis - improving. Pt with WBC of 28K on admission. Etiology most likely due to CAP.  -Continue to monitor CBC  Normocytic Anemia - stable with no active bleeding or hemodynamic instability.  Pt with Hg of admission of 10.9 with unknown baseline and etiology.  -Monitor for bleeding -Transfuse pRBCs to keep Hg>7 -Obtain anemia panel    Seizure Disorder - no reported seizure activity. Pt with reported seizure disorder on oxcarbazepine.  -Seizure precautions  -Continue home oxycarbazepine 300 mg BID -Limit sedative medidations  Fibromyalgia and Chronic Pain Syndrome - Pt with body aches and pain that is reported to be different form chronic pain most likely due to CAP. -Contine home fentanyl patch 50 mcg Q 3 days  -Continue home rizatriptan 10 mg daily PRN   -Continue home tramadol 50 mg Q6 hr PRN -Continue home pregabalin 300 mg -Hold home etodolac 500 mg TID in setting of epigastric pain  Prolonged QT - resolved. Pt with QTc of 512 with repeat QTc of 429 on 1/19. -Avoid QT prolonging medications -Obtain TSH  Epigastric Pain - etiology unknown, most likely due to gastritis (chronic etodolac use) vs GERD. Lipase was within normal limits.  -Protonix 40 mg daily  -Promethazine 12.5 mg Q6 hr PRN -Hold home etodolac 500 mg  TID -Consider PPI therapy on discharge  Constipation - Pt reports no recent BM with normal passage of gas. -Miralax PRN daily -Colace PRN daily  -Replace electrolytes as needed  Diet: Low sodium DVT Ppx: SQ Heparin TID Code: Full  Dispo: Disposition is deferred at this time, awaiting improvement of current medical problems.  Anticipated discharge in approximately 3-5 day(s).   The patient does not have a current PCP (No Pcp Per Patient) and does need an [facility] hospital follow-up appointment after discharge.  The patient does not have transportation limitations that hinder transportation to clinic appointments.  .Services Needed at time of discharge: Y = Yes, Blank = No PT:   OT:   RN:   Equipment:   Other:     LOS: 1 day   Otis Brace, MD 10/06/2013, 11:30 AM

## 2013-10-06 NOTE — Progress Notes (Signed)
Echocardiogram 2D Echocardiogram has been performed.  [name] [name] 10/06/2013, 9:27 AM

## 2013-10-06 NOTE — Progress Notes (Signed)
Pharmacy: Levaquin  52yom started on vancomycin and ceftriaxone for CAP now to switch to vancomycin and levaquin. Renal function is stable.  1/18 Vancomycin>> 1/18 CTX>>1/19 1/19 Levaquin>> 1/18 blood x2>> ngtd  Plan: 1) Levaquin 750mg  IV q24 2) Follow renal function, cultures, LOT  Louie CasaJennifer Mailen Newborn, PharmD, BCPS 10/06/2013, 2:12 PM

## 2013-10-06 NOTE — Progress Notes (Signed)
Utilization review completed.  

## 2013-10-06 NOTE — H&P (Signed)
Internal Medicine Attending Admission Note Date: 10/06/2013  Patient name: Rachna Schonberger Medical record number: [id number] Date of birth: Nov 16, 1960 Age: 53 y.o. Gender: female  I saw and evaluated the patient. I reviewed the resident's note and I agree with the resident's findings and plan as documented in the resident's note, with the following additional comments.  Patient denies sore throat or upper respiratory symptoms.  Chief Complaint(s): Cough, fever, generalized weakness, aches  History - key components related to admission: Patient is a 53 year old woman with history of chronic pain admitted with a three-day history of cough, fever, generalized weakness, and aching especially in her upper back.  Patient denies any recent sick contacts.    Physical Exam - key components related to admission:  Filed Vitals:   10/06/13 0357 10/06/13 0530 10/06/13 0656 10/06/13 0700  BP: 116/67  110/66 110/66  Pulse: 115  99   Temp: 101.4 F (38.6 C) 100.5 F (38.1 C)  98.9 F (37.2 C)  TempSrc: Oral   Oral  Resp: 29  21   Height:      Weight:      SpO2: 99%  95%    General: Alert, oriented Lungs: Bibasilar crackles; left upper lobe crackles Heart: Regular; no extra sounds or murmurs Abdomen: Bowel sounds present, soft, nontender; no hepatosplenomegaly Extremities: No edema; no calf tenderness  Lab results:   Basic Metabolic Panel:  Recent Labs  08/65/78 1550  NA 142  K 4.6  CL 101  CO2 25  GLUCOSE 136*  BUN 22  CREATININE 0.81  CALCIUM 8.7      Recent Labs  10/06/13 0025  LIPASE 9*     CBC:  Recent Labs  10/05/13 1550  WBC 28.0*  HGB 10.9*  HCT 34.3*  MCV 91.7  PLT 408*    Recent Labs  10/05/13 1550  NEUTROABS 25.3*  LYMPHSABS 1.2  MONOABS 1.5*  EOSABS 0.0  BASOSABS 0.0      BNP:  Recent Labs  10/05/13 2108  PROBNP 465.7*      Urinalysis    Component Value Date/Time   COLORURINE YELLOW 10/05/2013 2345   APPEARANCEUR CLEAR 10/05/2013  2345   LABSPEC 1.021 10/05/2013 2345   PHURINE 6.0 10/05/2013 2345   GLUCOSEU NEGATIVE 10/05/2013 2345   HGBUR NEGATIVE 10/05/2013 2345   BILIRUBINUR LARGE* 10/05/2013 2345   KETONESUR NEGATIVE 10/05/2013 2345   PROTEINUR NEGATIVE 10/05/2013 2345   UROBILINOGEN 1.0 10/05/2013 2345   NITRITE NEGATIVE 10/05/2013 2345   LEUKOCYTESUR MODERATE* 10/05/2013 2345    Urine microscopic:  Recent Labs  10/05/13 2345  EPIU RARE  WBCU 3-6  RBCU 7-10  BACTERIA FEW*    Imaging results:  Dg Chest 2 View  10/05/2013   CLINICAL DATA:  History of chest pain for 48 hr. And arthritis and fibroma myalgia  EXAM: CHEST  2 VIEW  COMPARISON:  None.  FINDINGS: The lungs are well-expanded. There are fluffy increased lung markings bilaterally but most conspicuously in the right upper lobe. The cardiopericardial silhouette is normal in size. The pulmonary vascularity is not engorged. There is no pleural effusion or pneumothorax. The mediastinum is normal in width. The observed portions of the bony thorax exhibit no acute abnormalities.  IMPRESSION: Increased interstitial and alveolar densities bilaterally but predominantly on the right are consistent with pneumonia. Certainly other alveolar filling process ease could be a present. Follow-up chest films or chest CT scanning following therapy will be needed to assure clearing.   Electronically Signed   By: Onalee Hua  [place]   On: 10/05/2013 16:33    Other results: EKG 1/18: Sinus tachycardia; borderline T wave abnormalities; prolonged QT interval EKG 1/19: Normal sinus rhythm; possible left atrial enlargement  Assessment & Plan by Problem:  1.  Pneumonia with sepsis.  Likely community-acquired pneumonia.  Plan is empiric broad-spectrum IV antibiotics pending blood and sputum cultures; supplement oxygen and follow saturation; check influenza panel.  2.  Acute respiratory failure secondary to #1.  Plan is treat pneumonia as above; supplement oxygen and follow saturations;  inhaled bronchodilators; monitor closely in stepdown unit setting; if condition worsens, consult pulmonary/critical care medicine.  3.  Seizure disorder.  Continue home regimen.  4.  Other problems and plans as per the resident physician's note.

## 2013-10-06 NOTE — Progress Notes (Signed)
Pt has had several orders for abg but they are being cancelled.  Rt will continue to watch chart.

## 2013-10-06 NOTE — Progress Notes (Signed)
ANTIBIOTIC CONSULT NOTE - INITIAL  Pharmacy Consult for vancomycin Indication: rule out pneumonia and rule out sepsis  Allergies  Allergen Reactions  . Shellfish Allergy Anaphylaxis    Patient Measurements: Height: 5\' 1"  (154.9 cm) Weight: 139 lb 8.8 oz (63.3 kg) IBW/kg (Calculated) : 47.8  Vital Signs: Temp: 99 F (37.2 C) (01/18 2346) Temp src: Oral (01/18 2346) BP: 102/60 mmHg (01/18 2346) Pulse Rate: 98 (01/18 2346)  Labs:  Recent Labs  10/05/13 1550  WBC 28.0*  HGB 10.9*  PLT 408*  CREATININE 0.81   Estimated Creatinine Clearance: 70 ml/min (by C-G formula based on Cr of 0.81).   Microbiology: Recent Results (from the past 720 hour(s))  MRSA PCR SCREENING     Status: None   Collection Time    10/05/13  7:36 PM      Result Value Range Status   MRSA by PCR NEGATIVE  NEGATIVE Final   Comment:            The GeneXpert MRSA Assay (FDA     approved for NASAL specimens     only), is one component of a     comprehensive MRSA colonization     surveillance program. It is not     intended to diagnose MRSA     infection nor to guide or     monitor treatment for     MRSA infections.    Medical History: Past Medical History  Diagnosis Date  . Fibromyalgia   . Chronic pain   . Seizures   . Migraine     Medications:  Prescriptions prior to admission  Medication Sig Dispense Refill  . etodolac (LODINE) 500 MG tablet Take 500 mg by mouth 3 (three) times daily.      . fentaNYL (DURAGESIC - DOSED MCG/HR) 50 MCG/HR Place 50 mcg onto the skin every 3 (three) days.      . Oxcarbazepine (TRILEPTAL) 300 MG tablet Take 300 mg by mouth 2 (two) times daily.      . pregabalin (LYRICA) 300 MG capsule Take 300 mg by mouth 3 (three) times daily.      . rizatriptan (MAXALT) 10 MG tablet Take 10 mg by mouth daily as needed for migraine. May repeat in 2 hours if needed      . traMADol (ULTRAM) 50 MG tablet Take 50 mg by mouth every 6 (six) hours as needed for moderate pain.        Scheduled:  . [START ON 10/07/2013] fentaNYL  50 mcg Transdermal Q72H  . heparin  5,000 Units Subcutaneous Q8H  . pregabalin  300 mg Oral TID  . sodium chloride  3 mL Intravenous Q12H  . vancomycin  750 mg Intravenous Q12H  . vancomycin  750 mg Intravenous Once   Infusions:  . sodium chloride 100 mL (10/05/13 2145)    Assessment: 53yo female c/o fibromyalgia pain and weakness, found with leukocytosis, CXR c/w PNA, concern for sepsis, to begin IV ABX.  Goal of Therapy:  Vancomycin trough level 15-20 mcg/ml  Plan:  Will begin vancomycin 750mg  IV Q12H and monitor CBC, Cx, levels prn.  Vernard GamblesVeronda Dahlton Hinde, PharmD, BCPS  10/06/2013,12:01 AM

## 2013-10-07 DIAGNOSIS — E876 Hypokalemia: Secondary | ICD-10-CM

## 2013-10-07 LAB — BASIC METABOLIC PANEL
BUN: 8 mg/dL (ref 6–23)
CALCIUM: 8.5 mg/dL (ref 8.4–10.5)
CO2: 24 mEq/L (ref 19–32)
CREATININE: 0.67 mg/dL (ref 0.50–1.10)
Chloride: 104 mEq/L (ref 96–112)
GFR calc non Af Amer: >90 mL/min (ref >90)
Glucose, Bld: 102 mg/dL — ABNORMAL HIGH (ref 70–99)
Potassium: 3.1 mEq/L — ABNORMAL LOW (ref 3.7–5.3)
Sodium: 140 mEq/L (ref 137–147)

## 2013-10-07 LAB — RETICULOCYTES
RBC.: 3.4 MIL/uL — AB (ref 3.87–5.11)
RETIC COUNT ABSOLUTE: 78.2 10*3/uL (ref 19.0–186.0)
RETIC CT PCT: 2.3 % (ref 0.4–3.1)

## 2013-10-07 LAB — RAPID URINE DRUG SCREEN, HOSP PERFORMED
Amphetamines: NOT DETECTED
BARBITURATES: NOT DETECTED
Benzodiazepines: NOT DETECTED
COCAINE: NOT DETECTED
Opiates: NOT DETECTED
Tetrahydrocannabinol: NOT DETECTED

## 2013-10-07 LAB — FOLATE: Folate: 2.6 ng/mL — ABNORMAL LOW

## 2013-10-07 LAB — CBC
HEMATOCRIT: 30.2 % — AB (ref 36.0–46.0)
HEMOGLOBIN: 9.9 g/dL — AB (ref 12.0–15.0)
MCH: 29.1 pg (ref 26.0–34.0)
MCHC: 32.8 g/dL (ref 30.0–36.0)
MCV: 88.8 fL (ref 78.0–100.0)
PLATELETS: 340 10*3/uL (ref 150–400)
RBC: 3.4 MIL/uL — AB (ref 3.87–5.11)
RDW: 14.6 % (ref 11.5–15.5)
WBC: 14.6 10*3/uL — ABNORMAL HIGH (ref 4.0–10.5)

## 2013-10-07 LAB — FERRITIN: FERRITIN: 535 ng/mL — AB (ref 10–291)

## 2013-10-07 LAB — TSH: TSH: 0.677 u[IU]/mL (ref 0.350–4.500)

## 2013-10-07 LAB — VITAMIN B12: Vitamin B-12: 231 pg/mL (ref 211–911)

## 2013-10-07 LAB — MAGNESIUM: MAGNESIUM: 1.7 mg/dL (ref 1.5–2.5)

## 2013-10-07 MED ORDER — LEVALBUTEROL HCL 0.63 MG/3ML IN NEBU
0.6300 mg | INHALATION_SOLUTION | RESPIRATORY_TRACT | Status: DC | PRN
  Administered 2013-10-07: 0.63 mg via RESPIRATORY_TRACT

## 2013-10-07 MED ORDER — ETODOLAC 300 MG PO CAPS
300.0000 mg | ORAL_CAPSULE | Freq: Once | ORAL | Status: AC
  Administered 2013-10-07: 300 mg via ORAL
  Filled 2013-10-07: qty 1

## 2013-10-07 MED ORDER — POTASSIUM CHLORIDE CRYS ER 20 MEQ PO TBCR
40.0000 meq | EXTENDED_RELEASE_TABLET | ORAL | Status: AC
  Administered 2013-10-07 (×2): 40 meq via ORAL
  Filled 2013-10-07 (×2): qty 2

## 2013-10-07 MED ORDER — ETODOLAC 300 MG PO CAPS: 300.0000 mg | ORAL_CAPSULE | Freq: Three times a day (TID) | ORAL | Status: DC

## 2013-10-07 NOTE — Progress Notes (Signed)
Pt arrived to the unit via bed with dx: PNA. Pt alert and oriented x4. Ambulatory x1 assist. Pt c/o headache was given PRN pain med. VS stable. Family a bedside. Pt oriented to the unit and staff. Will cont to monitor.

## 2013-10-07 NOTE — Progress Notes (Addendum)
Subjective:  Pt seen and examined in AM. No acute events overnight. Pt reports feeling much better today. Her breathing has improved and cough is improving (still productive and green). She reports her epigastric pain has improved after protonix. She denies fever, chills, nausea, vomiting, or change in BM or urination. Her weakness is also slowly improving.   Objective: Vital signs in last 24 hours: Filed Vitals:   10/07/13 0615 10/07/13 0630 10/07/13 0806 10/07/13 0809  BP:   125/77   Pulse:      Temp:   98.1 F (36.7 C)   TempSrc:   Oral   Resp:      Height:      Weight:      SpO2: 98% 97%  97%   Weight change: -2.7 kg (-5 lb 15.2 oz)  Intake/Output Summary (Last 24 hours) at 10/07/13 1151 Last data filed at 10/07/13 0315  Gross per 24 hour  Intake    180 ml  Output    735 ml  Net   -555 ml   Physical Exam  Constitutional: She is oriented to person, place, and time. She appears well-developed and well-nourished. No distress.  HENT:  Head: Normocephalic and atraumatic.  Neck: Neck supple.   Cardiovascular: Normal rate, regular rhythm, normal heart sounds. No murmur heard.  Pulmonary/Chest: Breathing on [place]. She has no wheezes. She has crackles at bases  R>L. She exhibits no tenderness.  Abdominal: Soft. Bowel sounds are normal. She exhibits no distension. There is epigastric tenderness. There is no rebound.  Neurological: She is alert and oriented to person, place, and time.  Skin: She is not diaphoretic.  Psychiatric:    Lab Results: Basic Metabolic Panel:  Recent Labs Lab 10/05/13 1550 10/07/13 0300  NA 142 140  K 4.6 3.1*  CL 101 104  CO2 25 24  GLUCOSE 136* 102*  BUN 22 8  CREATININE 0.81 0.67  CALCIUM 8.7 8.5  MG  --  1.7   Liver Function Tests: No results found for this basename: AST, ALT, ALKPHOS, BILITOT, PROT, ALBUMIN,  in the last 168 hours  Recent Labs Lab 10/06/13 0025  LIPASE 9*   No results found for this basename: AMMONIA,  in the  last 168 hours CBC:  Recent Labs Lab 10/05/13 1550 10/06/13 1708 10/07/13 0300  WBC 28.0* 18.9* 14.6*  NEUTROABS 25.3*  --   --   HGB 10.9* 10.5* 9.9*  HCT 34.3* 31.5* 30.2*  MCV 91.7 88.2 88.8  PLT 408* 374 340   Cardiac Enzymes: No results found for this basename: CKTOTAL, CKMB, CKMBINDEX, TROPONINI,  in the last 168 hours BNP:  Recent Labs Lab 10/05/13 2108  PROBNP 465.7*   D-Dimer: No results found for this basename: DDIMER,  in the last 168 hours CBG: No results found for this basename: GLUCAP,  in the last 168 hours Hemoglobin A1C: No results found for this basename: HGBA1C,  in the last 168 hours Fasting Lipid Panel: No results found for this basename: CHOL, HDL, LDLCALC, TRIG, CHOLHDL, LDLDIRECT,  in the last 168 hours Thyroid Function Tests: No results found for this basename: TSH, T4TOTAL, FREET4, T3FREE, THYROIDAB,  in the last 168 hours Coagulation: No results found for this basename: LABPROT, INR,  in the last 168 hours Anemia Panel:  Recent Labs Lab 10/07/13 0300  VITAMINB12 231  FOLATE 2.6*  FERRITIN 535*  RETICCTPCT 2.3   Urine Drug Screen: Drugs of Abuse     Component Value Date/Time  LABOPIA NONE DETECTED 10/07/2013 0018    Alcohol Level: No results found for this basename: ETH,  in the last 168 hours Urinalysis:  Recent Labs Lab 10/05/13 1645 10/05/13 2345  COLORURINE YELLOW YELLOW  LABSPEC 1.024 1.021  PHURINE 6.0 6.0  GLUCOSEU NEGATIVE NEGATIVE  HGBUR NEGATIVE NEGATIVE  BILIRUBINUR LARGE* LARGE*  KETONESUR NEGATIVE NEGATIVE  PROTEINUR 30* NEGATIVE  UROBILINOGEN 1.0 1.0  NITRITE NEGATIVE NEGATIVE  LEUKOCYTESUR NEGATIVE MODERATE*   Micro Results: Recent Results (from the past 240 hour(s))  CULTURE, BLOOD (ROUTINE X 2)     Status: None   Collection Time    10/05/13  5:49 PM      Result Value Range Status   Specimen Description BLOOD RIGHT ARM   Final   Special Requests BOTTLES DRAWN AEROBIC AND ANAEROBIC 5CC EACH   Final    Culture  Setup Time     Final   Value: 10/06/2013 08:56     Performed at [facility]   Culture     Final   Value:        BLOOD CULTURE RECEIVED NO GROWTH TO DATE CULTURE WILL BE HELD FOR 5 DAYS BEFORE ISSUING A FINAL NEGATIVE REPORT     Performed at [facility]   Report Status PENDING   Incomplete  MRSA PCR SCREENING     Status: None   Collection Time    10/05/13  7:36 PM      Result Value Range Status   MRSA by PCR NEGATIVE  NEGATIVE Final   Comment:            The GeneXpert MRSA Assay (FDA     approved for NASAL specimens     only), is one component of a     comprehensive MRSA colonization     surveillance program. It is not     intended to diagnose MRSA     infection nor to guide or     monitor treatment for     MRSA infections.  CULTURE, BLOOD (ROUTINE X 2)     Status: None   Collection Time    10/05/13  9:08 PM      Result Value Range Status   Specimen Description BLOOD LEFT ANTECUBITAL   Final   Special Requests BOTTLES DRAWN AEROBIC AND ANAEROBIC 10CC EA   Final   Culture  Setup Time     Final   Value: 10/06/2013 08:56     Performed at [facility]   Culture     Final   Value:        BLOOD CULTURE RECEIVED NO GROWTH TO DATE CULTURE WILL BE HELD FOR 5 DAYS BEFORE ISSUING A FINAL NEGATIVE REPORT     Performed at [facility]   Report Status PENDING   Incomplete   Studies/Results: Dg Chest 2 View  10/05/2013   CLINICAL DATA:  History of chest pain for 48 hr. And arthritis and fibroma myalgia  EXAM: CHEST  2 VIEW  COMPARISON:  None.  FINDINGS: The lungs are well-expanded. There are fluffy increased lung markings bilaterally but most conspicuously in the right upper lobe. The cardiopericardial silhouette is normal in size. The pulmonary vascularity is not engorged. There is no pleural effusion or pneumothorax. The mediastinum is normal in width. The observed portions of the bony thorax exhibit no acute abnormalities.  IMPRESSION: Increased  interstitial and alveolar densities bilaterally but predominantly on the right are consistent with pneumonia. Certainly other alveolar filling process ease  could be a present. Follow-up chest films or chest CT scanning following therapy will be needed to assure clearing.   Electronically Signed   By: David  [place]   On: 10/05/2013 16:33   Medications: I have reviewed the patient's current medications. Scheduled Meds: . fentaNYL  50 mcg Transdermal Q72H  . heparin  5,000 Units Subcutaneous Q8H  . influenza vac split quadrivalent PF  0.5 mL Intramuscular Tomorrow-1000  . ipratropium  0.5 mg Nebulization Q4H  . levalbuterol  0.63 mg Nebulization Q4H  . levofloxacin (LEVAQUIN) IV  750 mg Intravenous Q24H  . Oxcarbazepine  300 mg Oral BID  . pantoprazole  40 mg Oral Daily  . potassium chloride  40 mEq Oral Q4H  . pregabalin  300 mg Oral TID  . sodium chloride  3 mL Intravenous Q12H   Continuous Infusions:  PRN Meds:.acetaminophen, docusate sodium, pneumococcal 23 valent vaccine, polyethylene glycol, promethazine, rizatriptan, traMADol Assessment/Plan:  Assessment: 53 year old woman with past medical history of who presented on 1/18 with sepsis and found to have community acquired pneumonia complicated by acute respiratory failure.   Plan:   Community Acquired Pneumonia complicated by hypoxia  -  Clinically improving with improving leukocytosis however still with tachypnea and tachycardia, currently 97%  On 3L [place] O2. Chest xray on 1/18 with increased interstitial and alveolar densities bilaterally, predominantly on the right consistent with pneumonia. Pt was influenza, HIV, legionella, and strep pneuomonaie negative.  -Oxygen therapy to keep SpO2 >92%, wean as tolerated -Ambulate every shift with pulse oximetry  -Obtain vital signs Q4 hr -Continue IV levofloxacin 750 daily (total Day 3 antibiotics), transition to PO with improvement in respiratory status -Levalbuterol Q4 hr PRN     -Acetaminphen PRN fever -Blood cultures x2 --> NGTD -Obtain gram stain and culture -Continue to monitor WBC---> improving leukocytosis -Follow-up imaging in 4-6 weeks for resolution of pneumonia   Hypokalemia - Pt with potassium on 1/20 of 3.1. Mg was within normal limits. Etiology most likely due decreased PO intake.  -Replace with PO KCl as needed -Continue to monitor  Leukocytosis - improving. Pt with WBC of 28K on admission, today 18.9K Etiology most likely due to CAP.  -Continue to monitor CBC  Normocytic Anemia - stable with no active bleeding or hemodynamic instability. Pt with Hg of admission on 10.9 with unknown baseline and etiology.  -Monitor for bleeding -Transfuse pRBCs to keep Hg>7 -Obtain anemia panel --> vit B12 (wnl), folate (wnl), iron, ferritin ( 533 H), TIBC, reticulocyte (wnl)  Seizure Disorder - no reported seizure activity. Pt with reported seizure disorder on oxcarbazepine.  -Seizure precautions  -Continue home oxycarbazepine 300 mg BID -Limit sedative medidations  Fibromyalgia and Chronic Pain Syndrome - Pt with body aches and pain that is reported to be different form chronic pain most likely due to CAP. -Contine home fentanyl patch 50 mcg Q 3 days  -Continue home rizatriptan 10 mg daily PRN   -Continue home tramadol 50 mg Q6 hr PRN -Continue home pregabalin 300 mg -Avoid home etodolac 500 mg TID in setting of epigastric pain -Obtain UDS --> wnl   Epigastric Pain - improved. etiology unknown, most likely due to gastritis (chronic etodolac use) vs PUD vs GERD. Lipase was within normal limits.  -Protonix 40 mg daily  -Promethazine 12.5 mg Q6 hr PRN -Avoid home etodolac 500 mg TID -Consider PPI therapy on discharge  Constipation - resolved.  Pt reports no recent BM with normal passage of gas. -Miralax PRN daily -Colace PRN daily  -  Replace electrolytes as needed  Prolonged QT - resolved. Pt with QTc of 512 with repeat QTc of 429 on 1/19. -Avoid QT  prolonging medications -Obtain TSH --> wnl   Diet: Regular DVT Ppx: SQ Heparin TID Code: Full  Dispo: Disposition is deferred at this time, awaiting improvement of current medical problems.  Anticipated discharge in approximately 3-5 day(s).   The patient does not have a current PCP (No Pcp Per Patient) and does need an [facility] hospital follow-up appointment after discharge.  The patient does not have transportation limitations that hinder transportation to clinic appointments.  .Services Needed at time of discharge: Y = Yes, Blank = No PT:   OT:   RN:   Equipment:   Other:     LOS: 2 days   Otis Brace, MD 10/07/2013, 11:51 AM

## 2013-10-07 NOTE — Progress Notes (Signed)
I have read and agree with [name]'s note, please see my progress note as well.  

## 2013-10-07 NOTE — Progress Notes (Signed)
Pt TX to 6E-24, VSS, called report. Family present & aware of TX.

## 2013-10-07 NOTE — Progress Notes (Signed)
Internal Medicine Attending  Date: 10/07/2013  Patient name: [name] [name] Medical record number: [id number] Date of birth: 05/01/1961 Age: 53 y.o. Gender: female  I saw and evaluated the patient, and discussed her care on A.M rounds with housestaff.  I reviewed the resident's note by Dr. Johna Rolesabbani and I agree with the resident's findings and plans as documented in her note.

## 2013-10-07 NOTE — Progress Notes (Signed)
Medical Student Daily Progress Note  Subjective: Pt reports she feels better today but still not at baseline. She still endorses malaise and fatigue but says it has improved since yesterday. Her epigastric pain has resolved s/p Protonix. She still has some SOB that is worsened by talking. Her appetite is still decreased from baseline. She had 1 BM yesterday s/p miralax. She endorses some burning during urination but says this is normal for her.   Objective: Vital signs in last 24 hours: Filed Vitals:   10/07/13 0806 10/07/13 0809 10/07/13 1207 10/07/13 1231  BP: 125/77     Pulse:      Temp: 98.1 F (36.7 C)  98.3 F (36.8 C)   TempSrc: Oral  Oral   Resp:      Height:      Weight:      SpO2:  97%  95%   Weight change: -2.7 kg (-5 lb 15.2 oz)  Intake/Output Summary (Last 24 hours) at 10/07/13 1453 Last data filed at 10/07/13 0315  Gross per 24 hour  Intake    180 ml  Output    735 ml  Net   -555 ml   Physical Exam: Gen: alert, cooperative with exam, slightly anxious HENT: Head: atraumatic, normocephalic Eyes: bilateral cloudy pupils, PERRL, EOM intact Throat: no oropharyngeal exudate. Bumps present on back of tongue CV: RRR, no murmurs, some tenderness to palpation Pulm: some respiratory distress (breathing 20-35 bpm), decreased posterior breath sounds, ?crackles on RML, RLL, LLL, no wheezes Abd: no epigastric pain, no pain to palpation Pulses: 2+ DP, 2+ radial Extremities: bilateral lower leg edema, no UE edema Neuro: alert and oriented x3, decreased UE strength 4/5 bilaterally, decreased grip strength, absent Babinski, CN II-CNXII grossly intact  Lab Results: Results for orders placed during the hospital encounter of 10/05/13 (from the past 24 hour(s))  CBC     Status: Abnormal   Collection Time    10/06/13  5:08 PM      Result Value Range   WBC 18.9 (*) 4.0 - 10.5 K/uL   RBC 3.57 (*) 3.87 - 5.11 MIL/uL   Hemoglobin 10.5 (*) 12.0 - 15.0 g/dL   HCT 16.1 (*) 09.6 -  46.0 %   MCV 88.2  78.0 - 100.0 fL   MCH 29.4  26.0 - 34.0 pg   MCHC 33.3  30.0 - 36.0 g/dL   RDW 04.5  40.9 - 81.1 %   Platelets 374  150 - 400 K/uL  URINE RAPID DRUG SCREEN (HOSP PERFORMED)     Status: None   Collection Time    10/07/13 12:18 AM      Result Value Range   Opiates NONE DETECTED  NONE DETECTED   Cocaine NONE DETECTED  NONE DETECTED   Benzodiazepines NONE DETECTED  NONE DETECTED   Amphetamines NONE DETECTED  NONE DETECTED   Tetrahydrocannabinol NONE DETECTED  NONE DETECTED   Barbiturates NONE DETECTED  NONE DETECTED  CBC     Status: Abnormal   Collection Time    10/07/13  3:00 AM      Result Value Range   WBC 14.6 (*) 4.0 - 10.5 K/uL   RBC 3.40 (*) 3.87 - 5.11 MIL/uL   Hemoglobin 9.9 (*) 12.0 - 15.0 g/dL   HCT 91.4 (*) 78.2 - 95.6 %   MCV 88.8  78.0 - 100.0 fL   MCH 29.1  26.0 - 34.0 pg   MCHC 32.8  30.0 - 36.0 g/dL   RDW 21.3  08.6 -  15.5 %   Platelets 340  150 - 400 K/uL  VITAMIN B12     Status: None   Collection Time    10/07/13  3:00 AM      Result Value Range   Vitamin B-12 231  211 - 911 pg/mL  FOLATE     Status: Abnormal   Collection Time    10/07/13  3:00 AM      Result Value Range   Folate 2.6 (*)   FERRITIN     Status: Abnormal   Collection Time    10/07/13  3:00 AM      Result Value Range   Ferritin 535 (*) 10 - 291 ng/mL  RETICULOCYTES     Status: Abnormal   Collection Time    10/07/13  3:00 AM      Result Value Range   Retic Ct Pct 2.3  0.4 - 3.1 %   RBC. 3.40 (*) 3.87 - 5.11 MIL/uL   Retic Count, Manual 78.2  19.0 - 186.0 K/uL  BASIC METABOLIC PANEL     Status: Abnormal   Collection Time    10/07/13  3:00 AM      Result Value Range   Sodium 140  137 - 147 mEq/L   Potassium 3.1 (*) 3.7 - 5.3 mEq/L   Chloride 104  96 - 112 mEq/L   CO2 24  19 - 32 mEq/L   Glucose, Bld 102 (*) 70 - 99 mg/dL   BUN 8  6 - 23 mg/dL   Creatinine, Ser 1.61  0.50 - 1.10 mg/dL   Calcium 8.5  8.4 - 09.6 mg/dL   GFR calc non Af Amer >90  >90 mL/min   GFR  calc Af Amer >90  >90 mL/min  MAGNESIUM     Status: None   Collection Time    10/07/13  3:00 AM      Result Value Range   Magnesium 1.7  1.5 - 2.5 mg/dL    Micro Results: Recent Results (from the past 240 hour(s))  CULTURE, BLOOD (ROUTINE X 2)     Status: None   Collection Time    10/05/13  5:49 PM      Result Value Range Status   Specimen Description BLOOD RIGHT ARM   Final   Special Requests BOTTLES DRAWN AEROBIC AND ANAEROBIC 5CC EACH   Final   Culture  Setup Time     Final   Value: 10/06/2013 08:56     Performed at [facility]   Culture     Final   Value:        BLOOD CULTURE RECEIVED NO GROWTH TO DATE CULTURE WILL BE HELD FOR 5 DAYS BEFORE ISSUING A FINAL NEGATIVE REPORT     Performed at [facility]   Report Status PENDING   Incomplete  MRSA PCR SCREENING     Status: None   Collection Time    10/05/13  7:36 PM      Result Value Range Status   MRSA by PCR NEGATIVE  NEGATIVE Final   Comment:            The GeneXpert MRSA Assay (FDA     approved for NASAL specimens     only), is one component of a     comprehensive MRSA colonization     surveillance program. It is not     intended to diagnose MRSA     infection nor to guide or     monitor treatment  for     MRSA infections.  CULTURE, BLOOD (ROUTINE X 2)     Status: None   Collection Time    10/05/13  9:08 PM      Result Value Range Status   Specimen Description BLOOD LEFT ANTECUBITAL   Final   Special Requests BOTTLES DRAWN AEROBIC AND ANAEROBIC 10CC EA   Final   Culture  Setup Time     Final   Value: 10/06/2013 08:56     Performed at [facility]   Culture     Final   Value:        BLOOD CULTURE RECEIVED NO GROWTH TO DATE CULTURE WILL BE HELD FOR 5 DAYS BEFORE ISSUING A FINAL NEGATIVE REPORT     Performed at [facility]   Report Status PENDING   Incomplete   Studies/Results: Dg Chest 2 View  10/05/2013   CLINICAL DATA:  History of chest pain for 48 hr. And arthritis and  fibroma myalgia  EXAM: CHEST  2 VIEW  COMPARISON:  None.  FINDINGS: The lungs are well-expanded. There are fluffy increased lung markings bilaterally but most conspicuously in the right upper lobe. The cardiopericardial silhouette is normal in size. The pulmonary vascularity is not engorged. There is no pleural effusion or pneumothorax. The mediastinum is normal in width. The observed portions of the bony thorax exhibit no acute abnormalities.  IMPRESSION: Increased interstitial and alveolar densities bilaterally but predominantly on the right are consistent with pneumonia. Certainly other alveolar filling process ease could be a present. Follow-up chest films or chest CT scanning following therapy will be needed to assure clearing.   Electronically Signed   By: David  [place]   On: 10/05/2013 16:33   Medications:  Scheduled Meds: . fentaNYL  50 mcg Transdermal Q72H  . heparin  5,000 Units Subcutaneous Q8H  . influenza vac split quadrivalent PF  0.5 mL Intramuscular Tomorrow-1000  . ipratropium  0.5 mg Nebulization Q4H  . levalbuterol  0.63 mg Nebulization Q4H  . levofloxacin (LEVAQUIN) IV  750 mg Intravenous Q24H  . Oxcarbazepine  300 mg Oral BID  . pantoprazole  40 mg Oral Daily  . pregabalin  300 mg Oral TID  . sodium chloride  3 mL Intravenous Q12H   Continuous Infusions:  PRN Meds:.acetaminophen, docusate sodium, pneumococcal 23 valent vaccine, polyethylene glycol, promethazine, rizatriptan, traMADol Assessment/Plan: Principal Problem:   CAP (community acquired pneumonia) Active Problems:   Sepsis   Chronic pain   Hypoxia   Leukocytosis   LOS: 2 days   #Community Acquired Pneumonia with sepsis - Pt no longer febrile and with improving WBC count (from 18.9 --> 14.6). She is doing better clinically and satt'ing 95% on 2L O2. However, she continues to have intermittent episodes of increased RR: 27-35, and desaturates to 90% when taken off O2. Thus, will keep on O2 and wean as tolerated.  Additionally, will ambulate patient and monitor O2 stats. Common pathogens for CAP are: S. Pneumoniae, Respiratory viruses (influenza, parainfluenza, RSV), M. Pneumoniae, H. Flu, C. Pneumonia and Legionella. Pt is negative for influenza, HIV, legionella, strep pneumo and MRSA. If sputum culture shows gram neg bacilli, could consider Klebseilla or Pseudomonas as possible etiologies - this is less likely given that she does not have any of the risk factors for CAP due to gram negative bacilli including previous antibiotic therapy, prior hospitalization, immunosuppression, pulmonary comorbidity (CF, bronchiectasis, repeated exacerbations of COPD that require frequent glucocorticoid/antibiotic use).  -O2 therapy to keep SpO2 > 92%,  wean as tolerated -Continue IV levofloxacin 750 mg (Day 3 of 5) - Keeping on IV levofloxacin because bioavailability of IV levo is higher than oral. Consider transitioning to PO levofloxacin in 2 days. -D/c'ed ipratropium. Changed levalbuterol from q4h to prn q4h because patient not complaining of SOB. -Ambulate patient and monitor O2 stats.  -Acetaminophen prn fever -Blood cultures: NGTD x1 -Gram stain and sputum culture: needs to be collected -WBC improving, continue to monitor -Follow up CXR in 7-12 weeks to ensure resolution and rule out underlying process such as malignancy -Change diet from low sodium to normal.  #Hypokalemia: Pt with K: 3.1 on 1/19. Gave 2 doses 40 mEq K. -recheck BMP tomorrow  #possible CHF: There was concern for volume overload 2/2 to CHF given patient's bilateral LE edema and proBNP of 465.7. However, proBNP<900 is not specific for HF and ECHO showed EF: 60-65%, normal wall motion and no regional wall motion abnormalities. Given this, CHF is no longer a concern.   #Normocytic Anemia - Hb 10.9 on admission. Pt has no active bleeding or hemodynamic instability. Ferritin 535 (high), folate (2.6) low and Vitamin B12 normal. Anemia is normocytic and thus  unlikely due to folate deficiency. Given high ferritin level, could be due to anemia of chronic disease, but will need serum TIBC to be sure. -Monitor for bleeding -Obtain iron, TIBC levels  #Seizure Disorder - Pt does not report any seizures. Seizure activity  are stable and does not report any seizure activity.no reported seizure activity. Pt with reported seizure disorder on oxcarbazepine.  -Seizure precautions  -Continue home oxycarbazepine 300 mg BID  -Limit sedative medidations   #Fibromyalgia and Chronic Pain Syndrome - Pt says body aches are still worse than at home. Worsened body ache thought to be due to CAP.  -Contine home fentanyl patch 50 mcg Q 3 days  -Continue home rizatriptan 10 mg daily PRN  -Continue home tramadol 50 mg Q6 hr PRN  -Continue home pregabalin 300 mg  -Continue home etodolac 500 mg TID (epigastric pain has resolved after protonix)  #Prolonged QT - resolved. Pt with QTc of 512 with repeat QTc of 429 on 1/19.  -Avoid QT prolonging medications  -Obtain TSH   #Epigastric Pain - Pt's epigastric pain has improved with Protonix and is therefore most likely 2/2 to GERD. Lipase was wnl. -Continue Protonix 40 mg daily  -Promethazine 12.5 mg Q6 hr PRN  -Consider PPI therapy on discharge   #Constipation - Pt reports 1 BM today s/p Miralax. -Continue Miralax PRN daily  -Colace PRN daily  -Replace electrolytes as needed  This is a Psychologist, occupational Note.  The care of the patient was discussed with Dr. Johna Roles and the assessment and plan formulated with their assistance.  Please see their attached note for official documentation of the daily encounter.  Kairon Shock 10/07/2013, 2:53 PM

## 2013-10-08 ENCOUNTER — Inpatient Hospital Stay (HOSPITAL_COMMUNITY): Payer: Medicaid Other

## 2013-10-08 DIAGNOSIS — K219 Gastro-esophageal reflux disease without esophagitis: Secondary | ICD-10-CM

## 2013-10-08 DIAGNOSIS — R1310 Dysphagia, unspecified: Secondary | ICD-10-CM

## 2013-10-08 LAB — BASIC METABOLIC PANEL
BUN: 9 mg/dL (ref 6–23)
CHLORIDE: 104 meq/L (ref 96–112)
CO2: 24 meq/L (ref 19–32)
Calcium: 8.8 mg/dL (ref 8.4–10.5)
Creatinine, Ser: 0.79 mg/dL (ref 0.50–1.10)
GFR calc non Af Amer: >90 mL/min (ref >90)
Glucose, Bld: 110 mg/dL — ABNORMAL HIGH (ref 70–99)
POTASSIUM: 4.6 meq/L (ref 3.7–5.3)
Sodium: 140 mEq/L (ref 137–147)

## 2013-10-08 LAB — CBC
HCT: 29.7 % — ABNORMAL LOW (ref 36.0–46.0)
Hemoglobin: 9.7 g/dL — ABNORMAL LOW (ref 12.0–15.0)
MCH: 29 pg (ref 26.0–34.0)
MCHC: 32.7 g/dL (ref 30.0–36.0)
MCV: 88.9 fL (ref 78.0–100.0)
Platelets: 345 10*3/uL (ref 150–400)
RBC: 3.34 MIL/uL — ABNORMAL LOW (ref 3.87–5.11)
RDW: 14.7 % (ref 11.5–15.5)
WBC: 11.8 10*3/uL — ABNORMAL HIGH (ref 4.0–10.5)

## 2013-10-08 LAB — URINE CULTURE
Colony Count: NO GROWTH
Culture: NO GROWTH

## 2013-10-08 LAB — IRON AND TIBC
Iron: 14 ug/dL — ABNORMAL LOW (ref 42–135)
Saturation Ratios: 10 % — ABNORMAL LOW (ref 20–55)
TIBC: 137 ug/dL — ABNORMAL LOW (ref 250–470)
UIBC: 123 ug/dL — ABNORMAL LOW (ref 125–400)

## 2013-10-08 MED ORDER — LEVOFLOXACIN 750 MG PO TABS
750.0000 mg | ORAL_TABLET | Freq: Every day | ORAL | Status: AC
  Administered 2013-10-09: 750 mg via ORAL
  Filled 2013-10-08: qty 1

## 2013-10-08 MED ORDER — LEVOFLOXACIN IN D5W 750 MG/150ML IV SOLN
750.0000 mg | INTRAVENOUS | Status: AC
  Administered 2013-10-08: 750 mg via INTRAVENOUS
  Filled 2013-10-08: qty 150

## 2013-10-08 NOTE — Progress Notes (Signed)
Medical Student Daily Progress Note  Subjective: Pt reports she is breathing better than yesterday. She had a good sleep with no acute events last night. She has not tried to ambulate yet.  On review of symptoms, she complains of pain while swallowing. Says this has been present for 1 year and her mom has had to feed her soft foods and soups to prevent pain. She said this pain has gotten acutely worse while sick and has stopped her from eating while at the hospital. She says she has the sensation that food is getting stuck at the top of her throat making it hard to swallow.  She has epigastric pain this morning but has not yet received protonix. She said the protonix helped with her pain yesterday. She denies changes in urination, nausea, vomiting, bowel movements. She endorses some dizziness but says she has not eaten much in the last day.  Objective: Vital signs in last 24 hours: Filed Vitals:   10/07/13 1614 10/07/13 1848 10/07/13 2104 10/08/13 0444  BP:  110/75 123/78 101/65  Pulse:  100 117 91  Temp:  92.8 F (33.8 C) 99.9 F (37.7 C) 98.5 F (36.9 C)  TempSrc:  Oral Oral Oral  Resp:  30 28 28   Height:      Weight:   60.601 kg (133 lb 9.6 oz)   SpO2: 91% 93% 96% 93%   Weight change: 0.001 kg (0 oz)  Intake/Output Summary (Last 24 hours) at 10/08/13 0925 Last data filed at 10/08/13 0300  Gross per 24 hour  Intake    560 ml  Output    500 ml  Net     60 ml   Physical Exam: Gen: no acute distress, well-nourished HENT: Head: atraumatic, normocephalic Eyes: bilateral cloudy corneas, PERRL Oral/Throat: no erythema, no tonsillar swelling, no adenoidal swelling, mild tenderness to palpation of her throat, no thyromegaly, no oropharyngeal exudate. Pulm: no crackles to auscultation, no coarse breath sounds, no rales, no wheezing. Some SOB while talking. No accessory muscle use, no retractions. CV: RRR Pulses: radial 2+ bilaterally Extremities: feet elevated with no edema Skin:  warm Neuro: alert and oriented x3   Lab Results: No results found for this basename: AST, ALT, ALKPHOS, BILITOT, PROT, ALBUMIN,  in the last 168 hours  Recent Labs Lab 10/06/13 0025  LIPASE 9*   CBC:  Recent Labs Lab 10/05/13 1550 10/06/13 1708 10/07/13 0300 10/08/13 0520  WBC 28.0* 18.9* 14.6* 11.8*  NEUTROABS 25.3*  --   --   --   HGB 10.9* 10.5* 9.9* 9.7*  HCT 34.3* 31.5* 30.2* 29.7*  MCV 91.7 88.2 88.8 88.9  PLT 408* 374 340 345    Micro Results: Recent Results (from the past 240 hour(s))  CULTURE, BLOOD (ROUTINE X 2)     Status: None   Collection Time    10/05/13  5:49 PM      Result Value Range Status   Specimen Description BLOOD RIGHT ARM   Final   Special Requests BOTTLES DRAWN AEROBIC AND ANAEROBIC 5CC EACH   Final   Culture  Setup Time     Final   Value: 10/06/2013 08:56     Performed at [facility]   Culture     Final   Value:        BLOOD CULTURE RECEIVED NO GROWTH TO DATE CULTURE WILL BE HELD FOR 5 DAYS BEFORE ISSUING A FINAL NEGATIVE REPORT     Performed at [facility]   Report  Status PENDING   Incomplete  MRSA PCR SCREENING     Status: None   Collection Time    10/05/13  7:36 PM      Result Value Range Status   MRSA by PCR NEGATIVE  NEGATIVE Final   Comment:            The GeneXpert MRSA Assay (FDA     approved for NASAL specimens     only), is one component of a     comprehensive MRSA colonization     surveillance program. It is not     intended to diagnose MRSA     infection nor to guide or     monitor treatment for     MRSA infections.  CULTURE, BLOOD (ROUTINE X 2)     Status: None   Collection Time    10/05/13  9:08 PM      Result Value Range Status   Specimen Description BLOOD LEFT ANTECUBITAL   Final   Special Requests BOTTLES DRAWN AEROBIC AND ANAEROBIC 10CC EA   Final   Culture  Setup Time     Final   Value: 10/06/2013 08:56     Performed at [facility]   Culture     Final   Value:        BLOOD  CULTURE RECEIVED NO GROWTH TO DATE CULTURE WILL BE HELD FOR 5 DAYS BEFORE ISSUING A FINAL NEGATIVE REPORT     Performed at [facility]   Report Status PENDING   Incomplete  URINE CULTURE     Status: None   Collection Time    10/07/13 12:18 AM      Result Value Range Status   Specimen Description URINE, RANDOM   Final   Special Requests NONE   Final   Culture  Setup Time     Final   Value: 10/07/2013 00:35     Performed at [facility] Count     Final   Value: NO GROWTH     Performed at [facility]   Culture     Final   Value: NO GROWTH     Performed at [facility]   Report Status 10/08/2013 FINAL   Final   Studies/Results: No results found. Medications: Scheduled Meds: . fentaNYL  50 mcg Transdermal Q72H  . heparin  5,000 Units Subcutaneous Q8H  . influenza vac split quadrivalent PF  0.5 mL Intramuscular Tomorrow-1000  . levofloxacin (LEVAQUIN) IV  750 mg Intravenous Q24H  . Oxcarbazepine  300 mg Oral BID  . pantoprazole  40 mg Oral Daily  . pregabalin  300 mg Oral TID  . sodium chloride  3 mL Intravenous Q12H   Continuous Infusions:  PRN Meds:.acetaminophen, docusate sodium, levalbuterol, pneumococcal 23 valent vaccine, polyethylene glycol, promethazine, rizatriptan, traMADol Assessment/Plan:  #Community Acquired Pneumonia with sepsis - Pt afebrile all night with improving WBC count from 14.6 -->11.8 today. She is still requiring 3L O2 (SpO2: 93%), and is intermittently tachycardic and tachypneic. While ambulating, she desaturated to 70% suggesting that she still has not returned to baseline since she required no O2 at home. Aspiration pneumonia could be considered as a possible etiology considering her seizures and dysphagia (requiring anaerobe coverage), but since she is clinically improving, will not consider broadening antibiotic coverage now.  -O2 therapy to keep SpO2 > 92%, wean as tolerated. Tomorrow, take off O2 therapy for 30  minutes, check resting O2 stats. If no hypoxia reported,  ambulate without O2 and document any destats. If patient destats while ambulating, will send home on home oxygen. -Continue IV levofloxacin --> transition to PO levo tomorrow because of respiratory improvement -Levalbuterol q4h prn -Acetaminophen prn fever  -Blood cultures: NGTD -Urine cultures: NGTD -WBC improving, continue to monitor  -Follow up CXR in 7-12 weeks to ensure resolution and rule out underlying process such as malignancy   #Dysphagia: Patient reports pain with swallowing foods that has occurred for the past year.Could be oropharyngeal dysphagia vs esophageal dysphagia (stricture, diverticula, malignancy). Patient also has GERD (epigastric pain), chronic NSAID use and anemia. PUD could be a possibility, but anemia would most likely present as iron deficiency anemia if GI bleed from PUD was causing anemia.  -Swallow eval -Soft tissue neck CXR -Will consider softer food diet depending on swallow eval.  #Epigastric Pain - Pt's epigastric pain has improved with Protonix and is therefore most likely 2/2 to GERD. Lipase was wnl.  -Continue Protonix 40 mg daily  -Promethazine 12.5 mg Q6 hr PRN  -Consider PPI therapy on discharge   #Hypokalemia: Pt with K: 3.1 on 1/19. Resolved on 1/21 with K of 4.6.  #Normocytic Anemia - Hb 10.9 on admission and 9.6 today. Pt has no active bleeding or hemodynamic instability. Ferritin 535 (high), folate (2.6) low, iron (14), TIBC (137)  and Vitamin B12 normal. This fits the clinical picture of Anemia of Chronic Disease.  -Monitor for bleeding   #Seizure Disorder - Pt does not report any seizures. Seizure activity  are stable and does not report any seizure activity.no reported seizure activity. Pt with reported seizure disorder on oxcarbazepine.  -Seizure precautions  -Continue home oxycarbazepine 300 mg BID  -Limit sedative medidations   #Fibromyalgia and Chronic Pain Syndrome - Pt says  body aches are still worse than at home. Worsened body ache thought to be due to CAP.  -Contine home fentanyl patch 50 mcg Q 3 days  -Continue home rizatriptan 10 mg daily PRN  -Continue home tramadol 50 mg Q6 hr PRN  -Continue home pregabalin 300 mg  -Hold etodolac 500 mg TID because of concern for gastritis  #Prolonged QT - resolved. Pt with QTc of 512 with repeat QTc of 429 on 1/19.  -Avoid QT prolonging medications  -Obtain TSH    #Constipation - Pt reports 1 BM today s/p Miralax.  -Continue Miralax PRN daily  -Colace PRN daily  -Replace electrolytes as needed    This is a Psychologist, occupational Note.  The care of the patient was discussed with Dr. Johna Roles and the assessment and plan formulated with their assistance.  Please see their attached note for official documentation of the daily encounter.  Lior Cartelli 10/08/2013, 9:25 AM

## 2013-10-08 NOTE — Progress Notes (Signed)
I have read and agree with [name]'s note, please see my progress note as well.  

## 2013-10-08 NOTE — Progress Notes (Signed)
Internal Medicine Attending  Date: 10/08/2013  Patient name: [name] [name] Medical record number: [id number] Date of birth: 03/21/1961 Age: 53 y.o. Gender: female  I saw and evaluated the patient, and discussed her care with housestaff.  I reviewed the resident's note by Dr. Johna Rolesabbani and I agree with the resident's findings and plans as documented in her note.

## 2013-10-08 NOTE — Progress Notes (Signed)
   CARE MANAGEMENT NOTE 10/08/2013  Patient:  [name] [name]   Account Number:  [id number]  Date Initiated:  10/07/2013  Documentation initiated by:  Marvetta Gibbons  Subjective/Objective Assessment:   Pt admitted with CAP     Action/Plan:   PTA pt lived at home with relatives- NCM to follow for d/c needs   Anticipated DC Date:  10/09/2013   Anticipated DC Plan:  [place]  CM consult      Choice offered to / List presented to:             Status of service:  In process, will continue to follow Medicare Important Message given?   (If response is "NO", the following Medicare IM given date fields will be blank) Date Medicare IM given:   Date Additional Medicare IM given:    Discharge Disposition:    Per UR Regulation:  Reviewed for med. necessity/level of care/duration of stay  If discussed at [place] of Stay Meetings, dates discussed:    Comments:  10/08/2013  [place] RN,CCM  [phone] CM referral: need PCP  Met with patient regarding discharge planning.  She recently moved to [place], lives with her sister and mother. She does not have a PCP, went to Urgent care prior to coming to hospital. Her sister has a car and can drive her to appointments. She does not have insurance, applied for food stamps and Medicaid. NCM to continue to follow for discharge needs.  Financial Counselor/Maddie [phone] called regarding Medicaid application status. She will email the case manager and update NCM.  [place] called and left voice mail message requesting appointment post hospital discharge.

## 2013-10-08 NOTE — Progress Notes (Addendum)
Subjective:  Pt seen and examined in AM. Pt reports last night with bout of coughing of yellow sputum. She states she feels better with improved breathing. She is still requiring oxygen. No fever, chills, nausea, vomiting, or change in BM or urination. Mild epigastric pain today. She reports decreased appetite due to dysphagia to solids (and pills) without choking or regurgitation that has been worse in the past week since she has been sick but has been ongoing for the past yr per mother. She continues to complain of diffuse body aches.      Objective: Vital signs in last 24 hours: Filed Vitals:   10/07/13 1614 10/07/13 1848 10/07/13 2104 10/08/13 0444  BP:  110/75 123/78 101/65  Pulse:  100 117 91  Temp:  92.8 F (33.8 C) 99.9 F (37.7 C) 98.5 F (36.9 C)  TempSrc:  Oral Oral Oral  Resp:  30 28 28   Height:      Weight:   60.601 kg (133 lb 9.6 oz)   SpO2: 91% 93% 96% 93%   Weight change: 0.001 kg (0 oz)  Intake/Output Summary (Last 24 hours) at 10/08/13 1610 Last data filed at 10/08/13 0300  Gross per 24 hour  Intake    560 ml  Output    500 ml  Net     60 ml   Physical Exam  Constitutional: She is oriented to person, place, and time. She appears well-developed and well-nourished. No distress.  HENT:  Head: Normocephalic and atraumatic.  Neck: Neck supple.  Tenderness to palpation of anterior neck Cardiovascular: Normal rate, regular rhythm, normal heart sounds. No murmur heard.  Pulmonary/Chest: Breathing on 3 [place]. She has no wheezes. She has decreased breath sound at bases  R>L. She exhibits no tenderness.  Abdominal: Soft. Bowel sounds are normal. She exhibits no distension. There is epigastric tenderness. There is no rebound.  Neurological: She is alert and oriented to person, place, and time.  Skin: She is not diaphoretic.  Psychiatric:    Lab Results: Basic Metabolic Panel:  Recent Labs Lab 10/05/13 1550 10/07/13 0300 10/08/13 0520  NA 142 140 140  K 4.6  3.1* 4.6  CL 101 104 104  CO2 25 24 24   GLUCOSE 136* 102* 110*  BUN 22 8 9   CREATININE 0.81 0.67 0.79  CALCIUM 8.7 8.5 8.8  MG  --  1.7  --    Liver Function Tests: No results found for this basename: AST, ALT, ALKPHOS, BILITOT, PROT, ALBUMIN,  in the last 168 hours  Recent Labs Lab 10/06/13 0025  LIPASE 9*   No results found for this basename: AMMONIA,  in the last 168 hours CBC:  Recent Labs Lab 10/05/13 1550  10/07/13 0300 10/08/13 0520  WBC 28.0*  < > 14.6* 11.8*  NEUTROABS 25.3*  --   --   --   HGB 10.9*  < > 9.9* 9.7*  HCT 34.3*  < > 30.2* 29.7*  MCV 91.7  < > 88.8 88.9  PLT 408*  < > 340 345  < > = values in this interval not displayed. Cardiac Enzymes: No results found for this basename: CKTOTAL, CKMB, CKMBINDEX, TROPONINI,  in the last 168 hours BNP:  Recent Labs Lab 10/05/13 2108  PROBNP 465.7*   D-Dimer: No results found for this basename: DDIMER,  in the last 168 hours CBG: No results found for this basename: GLUCAP,  in the last 168 hours Hemoglobin A1C: No results found for this basename: HGBA1C,  in the last 168 hours Fasting Lipid Panel: No results found for this basename: CHOL, HDL, LDLCALC, TRIG, CHOLHDL, LDLDIRECT,  in the last 168 hours Thyroid Function Tests:  Recent Labs Lab 10/05/13 2108  TSH 0.677   Coagulation: No results found for this basename: LABPROT, INR,  in the last 168 hours Anemia Panel:  Recent Labs Lab 10/07/13 0300  VITAMINB12 231  FOLATE 2.6*  FERRITIN 535*  RETICCTPCT 2.3   Urine Drug Screen: Drugs of Abuse     Component Value Date/Time   LABOPIA NONE DETECTED 10/07/2013 0018    Alcohol Level: No results found for this basename: ETH,  in the last 168 hours Urinalysis:  Recent Labs Lab 10/05/13 1645 10/05/13 2345  COLORURINE YELLOW YELLOW  LABSPEC 1.024 1.021  PHURINE 6.0 6.0  GLUCOSEU NEGATIVE NEGATIVE  HGBUR NEGATIVE NEGATIVE  BILIRUBINUR LARGE* LARGE*  KETONESUR NEGATIVE NEGATIVE    PROTEINUR 30* NEGATIVE  UROBILINOGEN 1.0 1.0  NITRITE NEGATIVE NEGATIVE  LEUKOCYTESUR NEGATIVE MODERATE*   Micro Results: Recent Results (from the past 240 hour(s))  CULTURE, BLOOD (ROUTINE X 2)     Status: None   Collection Time    10/05/13  5:49 PM      Result Value Range Status   Specimen Description BLOOD RIGHT ARM   Final   Special Requests BOTTLES DRAWN AEROBIC AND ANAEROBIC 5CC EACH   Final   Culture  Setup Time     Final   Value: 10/06/2013 08:56     Performed at [facility]   Culture     Final   Value:        BLOOD CULTURE RECEIVED NO GROWTH TO DATE CULTURE WILL BE HELD FOR 5 DAYS BEFORE ISSUING A FINAL NEGATIVE REPORT     Performed at [facility]   Report Status PENDING   Incomplete  MRSA PCR SCREENING     Status: None   Collection Time    10/05/13  7:36 PM      Result Value Range Status   MRSA by PCR NEGATIVE  NEGATIVE Final   Comment:            The GeneXpert MRSA Assay (FDA     approved for NASAL specimens     only), is one component of a     comprehensive MRSA colonization     surveillance program. It is not     intended to diagnose MRSA     infection nor to guide or     monitor treatment for     MRSA infections.  CULTURE, BLOOD (ROUTINE X 2)     Status: None   Collection Time    10/05/13  9:08 PM      Result Value Range Status   Specimen Description BLOOD LEFT ANTECUBITAL   Final   Special Requests BOTTLES DRAWN AEROBIC AND ANAEROBIC 10CC EA   Final   Culture  Setup Time     Final   Value: 10/06/2013 08:56     Performed at [facility]   Culture     Final   Value:        BLOOD CULTURE RECEIVED NO GROWTH TO DATE CULTURE WILL BE HELD FOR 5 DAYS BEFORE ISSUING A FINAL NEGATIVE REPORT     Performed at [facility]   Report Status PENDING   Incomplete  URINE CULTURE     Status: None   Collection Time    10/07/13 12:18 AM      Result  Value Range Status   Specimen Description URINE, RANDOM   Final   Special Requests NONE    Final   Culture  Setup Time     Final   Value: 10/07/2013 00:35     Performed at [facility]   [place] Count     Final   Value: NO GROWTH     Performed at [facility]   Culture     Final   Value: NO GROWTH     Performed at [facility]   Report Status 10/08/2013 FINAL   Final   Studies/Results: No results found. Medications: I have reviewed the patient's current medications. Scheduled Meds: . fentaNYL  50 mcg Transdermal Q72H  . heparin  5,000 Units Subcutaneous Q8H  . influenza vac split quadrivalent PF  0.5 mL Intramuscular Tomorrow-1000  . levofloxacin (LEVAQUIN) IV  750 mg Intravenous Q24H  . Oxcarbazepine  300 mg Oral BID  . pantoprazole  40 mg Oral Daily  . pregabalin  300 mg Oral TID  . sodium chloride  3 mL Intravenous Q12H   Continuous Infusions:  PRN Meds:.acetaminophen, docusate sodium, levalbuterol, pneumococcal 23 valent vaccine, polyethylene glycol, promethazine, rizatriptan, traMADol Assessment/Plan:  Assessment: 53 year old woman with past medical history of who presented on 1/18 with sepsis and found to have community acquired pneumonia complicated by acute respiratory failure.   Plan:   Community Acquired Pneumonia complicated by hypoxia  -  Clinically improving with improving leukocytosis however still with tachypnea and tachycardia, currently 93% on 3L [place] O2. Chest xray on 1/18 with increased interstitial and alveolar densities bilaterally, predominantly on the right consistent with pneumonia. Pt found to be influenza, HIV, legionella, and strep pneuomonaie negative.  -Oxygen therapy to keep SpO2 >92%, wean as tolerated -Ambulate every shift with pulse oximetry--> 70% with ambulation -Obtain vital signs Q4 hr -Continue IV levofloxacin 750 daily (total Day 4 antibiotics), transition to PO tomm x 1 day due to improvement  in respiratory status -Levalbuterol Q4 hr PRN, caution if tachycardic  -Acetaminphen PRN fever -Blood cultures  x2 --> NGTD -Obtain gram stain and culture -Continue to monitor WBC---> improving leukocytosis -Follow-up imaging in 4-6 weeks for resolution of pneumonia    Dysphagia - Pt with 1 year history of dysphagia to solids with feeling of food stuck in throat. Oropharyngeal vs esophageal obstruction (stricture from chronic GERD, ring, web, malignancy). Pt also with weight loss and anemia concerning for alarm symptoms of GERD.   -Obtain speech evaluation dur to concern for aspiration PNA -Obtain XR soft tissue neck -Consider barium swallow, ENT/GI referral   Leukocytosis - improving. Pt with WBC of 28K on admission, today 11.8K Etiology most likely due to CAP.  -Blood cultures x 2 --> NGTD -Urine culture --> no growth  -Continue to monitor CBC  Normocytic Anemia - stable with no active bleeding or hemodynamic instability. Pt with Hg of admission on 10.9 with unknown baseline, currently 9.7. Etiology consistent with ACD.  -Monitor for bleeding -Transfuse pRBCs to keep Hg>7 -Obtain anemia panel --> vit B12 (wnl), folate (2.6 L), iron (14 L), ferritin ( 533 H), TIBC (137 L), reticulocyte (wnl) -Obtain FOBT to assess for GI bleeding   Seizure Disorder - no reported seizure activity. Pt with reported seizure disorder on oxcarbazepine.  -Seizure precautions  -Continue home oxycarbazepine 300 mg BID -Limit sedative medidations  Fibromyalgia and Chronic Pain Syndrome - Pt with body aches and pain that is reported to be different form chronic pain most likely due  to CAP. UDS was negative -Contine home fentanyl patch 50 mcg Q 3 days  -Continue home rizatriptan 10 mg daily PRN   -Continue home tramadol 50 mg Q6 hr PRN -Continue home pregabalin 300 mg -Avoid home etodolac 500 mg TID in setting of epigastric pain and possible gastritis  -Obtain physical therapy consult  Epigastric Pain in setting of GERD - improved. etiology unknown, most likely due to gastritis (chronic etodolac use) vs PUD vs GERD.  Lipase was within normal limits.  -Protonix 40 mg daily  -Promethazine 12.5 mg Q6 hr PRN -Avoid home etodolac 500 mg TID -Consider change in  PPI therapy (on prevacid BID) on discharge -Consider H pylori stool antigen   Hypokalemia - resolved. Pt with potassium on 1/20 of 3.1. Mg was within normal limits. Etiology most likely due to decreased PO intake.  -Replace with PO KCl as needed -Continue to monitor  Constipation - resolved.  Pt reports no recent BM with normal passage of gas. -Miralax PRN daily -Colace PRN daily  -Replace electrolytes as needed  Prolonged QT - resolved. Pt with QTc of 512 with repeat QTc of 429 on 1/19. -Avoid QT prolonging medications -Obtain TSH --> wnl   Diet: Regular DVT Ppx: SQ Heparin TID Code: Full  Dispo: Disposition is deferred at this time, awaiting improvement of current medical problems.  Anticipated discharge in approximately 3-5 day(s).   The patient does not have a current PCP (No Pcp Per Patient) and does need an [facility] hospital follow-up appointment after discharge.  The patient does not have transportation limitations that hinder transportation to clinic appointments.  .Services Needed at time of discharge: Y = Yes, Blank = No PT:   OT:   RN:   Equipment:   Other:     LOS: 3 days   Otis Brace, MD 10/08/2013, 9:04 AM

## 2013-10-08 NOTE — Progress Notes (Signed)
Pt ambulated in the hallway on room air and her oxygen sats dropped to 70% and heart rate was in the 130's. Pt was placed back on 3L [place].

## 2013-10-09 DIAGNOSIS — G8929 Other chronic pain: Secondary | ICD-10-CM

## 2013-10-09 LAB — CBC
HCT: 30.1 % — ABNORMAL LOW (ref 36.0–46.0)
HEMOGLOBIN: 9.6 g/dL — AB (ref 12.0–15.0)
MCH: 28.4 pg (ref 26.0–34.0)
MCHC: 31.9 g/dL (ref 30.0–36.0)
MCV: 89.1 fL (ref 78.0–100.0)
Platelets: 359 10*3/uL (ref 150–400)
RBC: 3.38 MIL/uL — ABNORMAL LOW (ref 3.87–5.11)
RDW: 14.9 % (ref 11.5–15.5)
WBC: 9.7 10*3/uL (ref 4.0–10.5)

## 2013-10-09 NOTE — Progress Notes (Signed)
SpO2 resting on room air: 92%  SpO2 resting on 3L via [place]: 95%  SpO2 during ambulation on room air: 85%  SpO2 during ambulation on 3L via [place]: 92%

## 2013-10-09 NOTE — Evaluation (Signed)
Physical Therapy Evaluation Patient Details Name: [name] [name] MRN: [id number] DOB: 1961/07/10 Today's Date: 10/09/2013 Time: 4098-1191 PT Time Calculation (min): 27 min  PT Assessment / Plan / Recommendation History of Present Illness  [name] [name] is a 53 y.o. woman with a pmhx of chronic pain(fibromyalgia) who presented to the ED with a cc of body pain and weakness. The patient was in her normal state of health until last week when she developed body aches, fevers and chills for the last 4 days and productive cough 3 days. Over the last two days she has had increasing weakness and fatigue  Clinical Impression  Patient presents with decreased independence with mobility due to deficits listed below.  She will benefit from skilled PT in the acute setting to allow return home with HHPT and family assist.  May need home oxygen as well.  See documentation for O2 need.    PT Assessment  Patient needs continued PT services    Follow Up Recommendations  Home health PT    Does the patient have the potential to tolerate intense rehabilitation    N/A  Barriers to Discharge  None      Equipment Recommendations  Rolling walker with 5" wheels    Recommendations for Other Services   None  Frequency Min 3X/week    Precautions / Restrictions Precautions Precautions: Fall Precaution Comments: oxygen dep   Pertinent Vitals/Pain C/o pain across upper back; for vitals see note for O2 saturation       Mobility  Bed Mobility Overal bed mobility: Modified Independent Transfers Overall transfer level: Needs assistance Transfers: Sit to/from Stand Sit to Stand: Supervision General transfer comment: for safety due to decreased balance in standing and bracing legs against bed Ambulation/Gait Ambulation/Gait assistance: Min guard;Supervision Ambulation Distance (Feet): 80 Feet Assistive device: Rolling walker (2 wheeled) Gait Pattern/deviations: Step-through pattern;Decreased stride  length;Shuffle Gait velocity interpretation: Below normal speed for age/gender General Gait Details: stopped to apply O2 half way during walk due to desaturation; relies on UE assist and LE weakness evident with ambulation in hips and ankles with shuffling gait and increased pelvic drop bilat    Exercises     PT Diagnosis: Abnormality of gait;Generalized weakness  PT Problem List: Decreased strength;Decreased mobility;Decreased balance;Decreased activity tolerance;Cardiopulmonary status limiting activity PT Treatment Interventions: DME instruction;Balance training;Gait training;Functional mobility training;Patient/family education;Therapeutic activities;Therapeutic exercise     PT Goals(Current goals can be found in the care plan section) Acute Rehab PT Goals Patient Stated Goal: To go home PT Goal Formulation: With patient/family Time For Goal Achievement: 10/16/13 Potential to Achieve Goals: Good  Visit Information  Last PT Received On: 10/09/13 Assistance Needed: +1 History of Present Illness: [name] [name] is a 53 y.o. woman with a pmhx of chronic pain(fibromyalgia) who presented to the ED with a cc of body pain and weakness. The patient was in her normal state of health until last week when she developed body aches, fevers and chills for the last 4 days and productive cough 3 days. Over the last two days she has had increasing weakness and fatigue       Prior Functioning  Home Living Family/patient expects to be discharged to:: Private residence Living Arrangements: Parent;Other relatives Available Help at Discharge: Family;Available 24 hours/day Type of Home: House Home Access: Stairs to enter [employer] of Steps: 1 Home Layout: One level Home Equipment: None Prior Function Level of Independence: Needs assistance ADL's / Homemaking Assistance Needed: assist for meals and bathing/dressing Communication Communication: No difficulties  Cognition   Cognition Arousal/Alertness: Awake/alert Behavior During Therapy: WFL for tasks assessed/performed Overall Cognitive Status: Within Functional Limits for tasks assessed    Extremity/Trunk Assessment Upper Extremity Assessment Upper Extremity Assessment: RUE deficits/detail;LUE deficits/detail RUE Deficits / Details: AROM limited to about 100 degrees shoulder flexion, reports upperback pain; strength grossly 3+ shoulder flexion 4- elbow flex/ext LUE Deficits / Details: AROM limited to about 100 degrees shoulder flexion, reports upperback pain; strength grossly 4- shoulder flexion 4+ elbow flex/ext Lower Extremity Assessment Lower Extremity Assessment: RLE deficits/detail;LLE deficits/detail RLE Deficits / Details: AROM WFL, strength hip flexion 3+/5, knee extension 3+/5, ankle DF 3/5 LLE Deficits / Details: AROM WFL, strength hip flexion 4-/5, knee extension 4-/5, ankle DF 4-/5   Balance Balance Overall balance assessment: Needs assistance Sitting-balance support: Feet unsupported Sitting balance-Leahy Scale: Good Standing balance support: Bilateral upper extremity supported Standing balance-Leahy Scale: Poor Standing balance comment: needs UE support for balance  End of Session PT - End of Session Equipment Utilized During Treatment: Gait belt;Oxygen Activity Tolerance: Patient tolerated treatment well Patient left: in bed;with call bell/phone within reach;with family/visitor present  GP     [facility] Stodd[facility] CountyWYNN,CYNDI 10/09/2013, 12:17 PM Sheran Lawlessyndi Wynn, PT [phone] 10/09/2013

## 2013-10-09 NOTE — Progress Notes (Signed)
I have read and agree with [facility], please see my progress note as well.

## 2013-10-09 NOTE — Progress Notes (Signed)
SATURATION QUALIFICATIONS: (This note is used to comply with regulatory documentation for home oxygen)  Patient Saturations on Room Air at Rest = 96%  Patient Saturations on Room Air while Ambulating = 84% (HR 130)  Patient Saturations on 2 Liters of oxygen while Ambulating = 94% (HR 121)  Please briefly explain why patient needs home oxygen:  Demonstrates decreased activity tolerance and weakness with increased HR ambulating without O2. [place] Necie Wilcoxson, [place] [phone] 10/09/2013

## 2013-10-09 NOTE — Progress Notes (Signed)
  Date: 10/09/2013  Patient name: [name] [name]  Medical record number: [id number]  Date of birth: 09/04/1961   This patient has been seen and the plan of care was discussed with the house staff. Please see their note for complete details. I concur with their findings.  Inez CatalinaEmily B Breah Joa, MD 10/09/2013, 12:58 PM

## 2013-10-09 NOTE — Progress Notes (Signed)
Medical Student Daily Progress Note  Subjective: No acute events overnight. Pt says still complains of difficulty swallowing but says pain with swallowing is improving back to baseline. She says her breathing has improved significantly and denies SOB at rest.  Objective: Vital signs in last 24 hours: Filed Vitals:   10/08/13 1741 10/08/13 2035 10/09/13 0539 10/09/13 0855  BP: 111/64 104/71 110/65 106/72  Pulse: 84 78 80 81  Temp: 98.4 F (36.9 C) 99.2 F (37.3 C) 98.6 F (37 C) 97.8 F (36.6 C)  TempSrc: Oral Oral Oral Oral  Resp: 28 28 26 22   Height:      Weight:  60.601 kg (133 lb 9.6 oz)    SpO2: 94% 96% 97% 96%   Weight change: 0 kg (0 lb)  Intake/Output Summary (Last 24 hours) at 10/09/13 1256 Last data filed at 10/09/13 0500  Gross per 24 hour  Intake    960 ml  Output    825 ml  Net    135 ml   Physical Exam: Gen: alert, in no acute distress HENT: Head: atraumatic, normocephalic Eyes: bilateral cloudy corneas, PERRL Oral/Throat: no thyromegaly, no erythema, no oropharyngeal exudates. Bumps visible on back of tongue CV: RRR Pulm: increased posterior breath sounds from yesterday, no crackles, wheezes or rales Abd: epigastric tenderness present, negative murphy sign Pulses: radial pulses 2+ bilaterally Skin: warm and dry   Lab Results:  Recent Labs Lab 10/06/13 0025  LIPASE 9*    CBC:  Recent Labs Lab 10/05/13 1550 10/06/13 1708 10/07/13 0300 10/08/13 0520 10/09/13 0400  WBC 28.0* 18.9* 14.6* 11.8* 9.7  NEUTROABS 25.3*  --   --   --   --   HGB 10.9* 10.5* 9.9* 9.7* 9.6*  HCT 34.3* 31.5* 30.2* 29.7* 30.1*  MCV 91.7 88.2 88.8 88.9 89.1  PLT 408* 374 340 345 359    Micro Results: Recent Results (from the past 240 hour(s))  CULTURE, BLOOD (ROUTINE X 2)     Status: None   Collection Time    10/05/13  5:49 PM      Result Value Range Status   Specimen Description BLOOD RIGHT ARM   Final   Special Requests BOTTLES DRAWN AEROBIC AND ANAEROBIC 5CC  EACH   Final   Culture  Setup Time     Final   Value: 10/06/2013 08:56     [facility]formed [facility]   Culture     Final   Value:        BLOOD CULTURE RECEIVED NO GROWTH TO DATE CULTURE WILL BE HELD FOR 5 DAYS BEFORE ISSUING A FINAL NEGATIVE REPORT     Performed at [facility]   Report Status PENDING   Incomplete  MRSA PCR SCREENING     Status: None   Collection Time    10/05/13  7:36 PM      Result Value Range Status   MRSA by PCR NEGATIVE  NEGATIVE Final   Comment:            The GeneXpert MRSA Assay (FDA     approved for NASAL specimens     only), is one component of a     comprehensive MRSA colonization     surveillance program. It is not     intended to diagnose MRSA     infection nor to guide or     monitor treatment for     MRSA infections.  CULTURE, BLOOD (ROUTINE X 2)     Status:  None   Collection Time    10/05/13  9:08 PM      Result Value Range Status   Specimen Description BLOOD LEFT ANTECUBITAL   Final   Special Requests BOTTLES DRAWN AEROBIC AND ANAEROBIC 10CC EA   Final   Culture  Setup Time     Final   Value: 10/06/2013 08:56     Performed at [facility]   Culture     Final   Value:        BLOOD CULTURE RECEIVED NO GROWTH TO DATE CULTURE WILL BE HELD FOR 5 DAYS BEFORE ISSUING A FINAL NEGATIVE REPORT     Performed at [facility]   Report Status PENDING   Incomplete  URINE CULTURE     Status: None   Collection Time    10/07/13 12:18 AM      Result Value Range Status   Specimen Description URINE, RANDOM   Final   Special Requests NONE   Final   Culture  Setup Time     Final   Value: 10/07/2013 00:35     Performed at [facility] Count     Final   Value: NO GROWTH     Performed at [facility]   Culture     Final   Value: NO GROWTH     Performed at [facility]   Report Status 10/08/2013 FINAL   Final   Studies/Results: Dg Neck Soft Tissue  10/08/2013   CLINICAL DATA:  Dysphagia.   EXAM: NECK SOFT TISSUES - 1+ VIEW  COMPARISON:  None.  FINDINGS: There is no evidence of retropharyngeal soft tissue swelling or epiglottic enlargement. The cervical airway is unremarkable and no radio-opaque foreign body identified. Tongue base is normal. Minimal anterior osteophytes in the lower cervical spine. No disc space narrowing.  IMPRESSION: No significant abnormality.   Electronically Signed   By: Geanie Cooley M.D.   On: 10/08/2013 17:40   Medications:  Scheduled Meds: . fentaNYL  50 mcg Transdermal Q72H  . heparin  5,000 Units Subcutaneous Q8H  . Oxcarbazepine  300 mg Oral BID  . pantoprazole  40 mg Oral Daily  . pregabalin  300 mg Oral TID  . sodium chloride  3 mL Intravenous Q12H   Continuous Infusions:  PRN Meds:.acetaminophen, docusate sodium, levalbuterol, pneumococcal 23 valent vaccine, polyethylene glycol, promethazine, rizatriptan, traMADol Assessment/Plan: Principal Problem:   CAP (community acquired pneumonia) Active Problems:   Sepsis   Chronic pain   Hypoxia   Leukocytosis   LOS: 4 days    Community Acquired Pneumonia with sepsis - Pt afebrile with improving WBC count today. Satting 96% on 2L of O2 at rest, but when patient ambulates she desaturated to 70s yesterday (HR 130) and 84% today (HR: 130). Her continued hypoxia on exertion is concerning because she has never required home oxygen. Possible underlying causes for her hypoxia on exertion are residual pneumonia, COPD, CHF or interstitial lung disease. Her PMH is unclear because she [place] ago and is unsure of any current cardiopulmonary diseases. Given her low proBNP, lack of peripheral edema, normal ECHO CHF is unlikely. COPD could be a possibility with her history of smoking, outpatient PFTs could be an option. Currently, her improving tachycardia, decreasing WBC, tachypnea, and improving ambulation stats suggests that her current hypoxia is more likely due to residual pneumonia  than these other processes. Possible TB has been suggested as a cause for  her weight loss, cough, fatigue, and dyspnea. However, dyspnea in TB is caused in the setting of extensive parenchymal involvement or pleural effusion or pneumothorax. Since CXR did not reveal effusion or pneumothorax, it is unlikely that TB would have caused this dyspnea.  -O2 therapy to keep SpO2 >92%, wean as tolerated.  -Ambulate patient again tomorrow and monitor O2 stats. If improvement, consider discharge -Day 5 of Levofloxacin therapy. No need to increase amount of antibiotic treatment because of her decreased WBC, absence of tachypnea, tachycardia or fever. -Levalbuterol q4h prn  -Acetaminophen prn fever  -Blood cultures: NGTD  -Urine cultures: NGTD  -WBC improving, continue to monitor  -Follow up CXR in 7-12 weeks to ensure resolution and rule out underlying process such as malignancy  -Consider outpatient PFTs if patient presents with recurrent pneumonia, continued dyspnea on exertion, cough   #Dysphagia: Patient reports pain with swallowing foods that has occurred for the past year. Could be oropharyngeal dysphagia vs esophageal dysphagia (stricture, diverticula, malignancy). Patient also has GERD (epigastric pain), chronic NSAID use and anemia. PUD could be a possibility, but anemia would most likely present as iron deficiency anemia if GI bleed from PUD was causing anemia.  -Swallow eval pending -Soft tissue neck CXR: negative for retropharyngeal swelling or epiglottitis -Will consider softer food diet depending on swallow eval.   #Epigastric Pain - Pt's epigastric pain has improved with Protonix and is therefore most likely 2/2 to GERD. Lipase was wnl.  -Continue Protonix 40 mg daily  -Promethazine 12.5 mg Q6 hr PRN  -Consider PPI therapy on discharge   #Hypokalemia: Pt with K: 3.1 on 1/19. Resolved on 1/21 with K of 4.6.   #Normocytic Anemia - Hb 10.9 on admission and 9.6 today. Pt has no active bleeding  or hemodynamic instability. Ferritin 535 (high), folate (2.6) low, iron (14), TIBC (137) and Vitamin B12 normal. This fits the clinical picture of Anemia of Chronic Disease.  -Monitor for bleeding   #Seizure Disorder - Pt does not report any seizures. Seizure activity  are stable and does not report any seizure activity.no reported seizure activity. Pt with reported seizure disorder on oxcarbazepine.  -Seizure precautions  -Continue home oxycarbazepine 300 mg BID  -Limit sedative medidations   #Fibromyalgia and Chronic Pain Syndrome - Pt says body aches are still worse than at home. Worsened body ache thought to be due to CAP.  -Contine home fentanyl patch 50 mcg Q 3 days  -Continue home rizatriptan 10 mg daily PRN  -Continue home tramadol 50 mg Q6 hr PRN  -Continue home pregabalin 300 mg  -Hold etodolac 500 mg TID because of concern for gastritis   #Prolonged QT - resolved. Pt with QTc of 512 with repeat QTc of 429 on 1/19.  -Avoid QT prolonging medications  -Obtain TSH   #Constipation - Pt reports 1 BM today s/p Miralax.  -Continue Miralax PRN daily  -Colace PRN daily  -Replace electrolytes as needed  This is a Psychologist, occupational Note.  The care of the patient was discussed with Dr. Johna Roles and the assessment and plan formulated with their assistance.  Please see their attached note for official documentation of the daily encounter.  Addeline Calarco 10/09/2013, 12:56 PM

## 2013-10-09 NOTE — Evaluation (Signed)
Clinical/Bedside Swallow Evaluation Patient Details  Name: [name] [name] MRN: [id number] Date of Birth: 12/16/1960  Today's Date: 10/09/2013 Time: [id number] SLP Time Calculation (min): 22 min  Past Medical History:  Past Medical History  Diagnosis Date  . Fibromyalgia   . Chronic pain   . Seizures   . Migraine    Past Surgical History: History reviewed. No pertinent past surgical history. HPI:  Patient is a 53 year old woman with history of chronic pain admitted with a three-day history of cough, fever, generalized weakness, and aching especially in her upper back. She complains of pain while swallowing. Says this has been present for 1 year and her mom has had to feed her soft foods and soups to prevent pain. She said this pain has gotten acutely worse while sick and has stopped her from eating while at the hospital. She says she has the sensation that food is getting stuck at the top of her throat making it hard to swallow.    Assessment / Plan / Recommendation Clinical Impression  Pt demonstrates normal oral function and is able to swallow without signs of aspiration. She does demonstrate grimace with swallow and reports pain during and globus after swallow. She reports she does have indigestion and takes Prevacid twice a day. She is also noted to have large bumps on base of tongue which may or may not be an acute finding. She confirms her mouth had sores this week when she had a fever.   Overal would describe this a suspected primary esophageal dysphagia. Recommend downgrade to dys 3 (mechanical soft diet) to facilitate intake. Also suggested alternating warm liquids and solids and staying upright during and after meals. Pt and family repeated instructions. If symptoms do not improve pt is encouraged to seek medical management from MD or GI.      Aspiration Risk  Moderate    Diet Recommendation Dysphagia 3 (Mechanical Soft);Thin liquid   Liquid Administration via:  Cup;Straw Medication Administration: Whole meds with liquid Supervision: Patient able to self feed Compensations: Slow rate;Small sips/bites;Follow solids with liquid Postural Changes and/or Swallow Maneuvers: Seated upright 90 degrees    Other  Recommendations Recommended Consults: Consider GI evaluation;Consider esophageal assessment Oral Care Recommendations: Oral care BID   Follow Up Recommendations  None    Frequency and Duration        Pertinent Vitals/Pain NA    SLP Swallow Goals     Swallow Study Prior Functional Status  Type of Home: House Available Help at Discharge: Family;Available 24 hours/day    General HPI: Patient is a 53 year old woman with history of chronic pain admitted with a three-day history of cough, fever, generalized weakness, and aching especially in her upper back. She complains of pain while swallowing. Says this has been present for 1 year and her mom has had to feed her soft foods and soups to prevent pain. She said this pain has gotten acutely worse while sick and has stopped her from eating while at the hospital. She says she has the sensation that food is getting stuck at the top of her throat making it hard to swallow.  Type of Study: Bedside swallow evaluation Previous Swallow Assessment: none Diet Prior to this Study: Regular;Thin liquids Temperature Spikes Noted: No Respiratory Status: Nasal cannula History of Recent Intubation: No Behavior/Cognition: Alert;Cooperative;Pleasant mood Oral Cavity - Dentition: Adequate natural dentition Self-Feeding Abilities: Able to feed self Patient Positioning: Upright in bed Baseline Vocal Quality: Clear Volitional Cough: Strong Volitional Swallow: Able to elicit  Oral/Motor/Sensory Function Overall Oral Motor/Sensory Function: Appears within functional limits for tasks assessed (pustules noted at base of tongue)   Ice Chips     Thin Liquid Thin Liquid: Within functional limits Presentation:  Cup;Straw;Self Fed    Nectar Thick Nectar Thick Liquid: Not tested   Honey Thick Honey Thick Liquid: Not tested   Puree Puree: Within functional limits   Solid   GO    Solid: Within functional limits       Vedika Dumlao, Riley Nearing 10/09/2013,2:25 PM

## 2013-10-09 NOTE — Progress Notes (Addendum)
Subjective:  Pt seen and examined in AM. No acute events overnight. Pt reports her breathing has improved and cough still present. She is still requiring oxygen.  Pt was able to eat yesterday however still with problems swalllowing.  Also with persistent epigastric pain with some nausea, however no vomiting.     Objective: Vital signs in last 24 hours: Filed Vitals:   10/08/13 1741 10/08/13 2035 10/09/13 0539 10/09/13 0855  BP: 111/64 104/71 110/65 106/72  Pulse: 84 78 80 81  Temp: 98.4 F (36.9 C) 99.2 F (37.3 C) 98.6 F (37 C) 97.8 F (36.6 C)  TempSrc: Oral Oral Oral Oral  Resp: 28 28 26 22   Height:      Weight:  60.601 kg (133 lb 9.6 oz)    SpO2: 94% 96% 97% 96%   Weight change: 0 kg (0 lb)  Intake/Output Summary (Last 24 hours) at 10/09/13 1137 Last data filed at 10/09/13 0500  Gross per 24 hour  Intake    960 ml  Output    825 ml  Net    135 ml   Physical Exam  Constitutional: She is oriented to person, place, and time. She appears well-developed and well-nourished. No distress.  HENT:  Head: Normocephalic and atraumatic.  Neck: Neck supple.   Cardiovascular: Normal rate, regular rhythm, normal heart sounds. No murmur heard.  Pulmonary/Chest: Breathing on 2L [place]. She has no wheezes. She has improved  breath sound. She exhibits no tenderness.  Abdominal: Soft. Bowel sounds are normal. She exhibits no distension. There is epigastric tenderness. There is no rebound.  Neurological: She is alert and oriented to person, place, and time.  Skin: She is not diaphoretic.  Psychiatric:    Lab Results: Basic Metabolic Panel:  Recent Labs Lab 10/05/13 1550 10/07/13 0300 10/08/13 0520  NA 142 140 140  K 4.6 3.1* 4.6  CL 101 104 104  CO2 25 24 24   GLUCOSE 136* 102* 110*  BUN 22 8 9   CREATININE 0.81 0.67 0.79  CALCIUM 8.7 8.5 8.8  MG  --  1.7  --    Liver Function Tests: No results found for this basename: AST, ALT, ALKPHOS, BILITOT, PROT, ALBUMIN,  in the  last 168 hours  Recent Labs Lab 10/06/13 0025  LIPASE 9*   No results found for this basename: AMMONIA,  in the last 168 hours CBC:  Recent Labs Lab 10/05/13 1550  10/08/13 0520 10/09/13 0400  WBC 28.0*  < > 11.8* 9.7  NEUTROABS 25.3*  --   --   --   HGB 10.9*  < > 9.7* 9.6*  HCT 34.3*  < > 29.7* 30.1*  MCV 91.7  < > 88.9 89.1  PLT 408*  < > 345 359  < > = values in this interval not displayed. Cardiac Enzymes: No results found for this basename: CKTOTAL, CKMB, CKMBINDEX, TROPONINI,  in the last 168 hours BNP:  Recent Labs Lab 10/05/13 2108  PROBNP 465.7*   D-Dimer: No results found for this basename: DDIMER,  in the last 168 hours CBG: No results found for this basename: GLUCAP,  in the last 168 hours Hemoglobin A1C: No results found for this basename: HGBA1C,  in the last 168 hours Fasting Lipid Panel: No results found for this basename: CHOL, HDL, LDLCALC, TRIG, CHOLHDL, LDLDIRECT,  in the last 168 hours Thyroid Function Tests:  Recent Labs Lab 10/05/13 2108  TSH 0.677   Coagulation: No results found for this basename: LABPROT, INR,  in the last 168 hours Anemia Panel:  Recent Labs Lab 10/07/13 0300  VITAMINB12 231  FOLATE 2.6*  FERRITIN 535*  TIBC 137*  IRON 14*  RETICCTPCT 2.3   Urine Drug Screen: Drugs of Abuse     Component Value Date/Time   LABOPIA NONE DETECTED 10/07/2013 0018    Alcohol Level: No results found for this basename: ETH,  in the last 168 hours Urinalysis:  Recent Labs Lab 10/05/13 1645 10/05/13 2345  COLORURINE YELLOW YELLOW  LABSPEC 1.024 1.021  PHURINE 6.0 6.0  GLUCOSEU NEGATIVE NEGATIVE  HGBUR NEGATIVE NEGATIVE  BILIRUBINUR LARGE* LARGE*  KETONESUR NEGATIVE NEGATIVE  PROTEINUR 30* NEGATIVE  UROBILINOGEN 1.0 1.0  NITRITE NEGATIVE NEGATIVE  LEUKOCYTESUR NEGATIVE MODERATE*   Micro Results: Recent Results (from the past 240 hour(s))  CULTURE, BLOOD (ROUTINE X 2)     Status: None   Collection Time     10/05/13  5:49 PM      Result Value Range Status   Specimen Description BLOOD RIGHT ARM   Final   Special Requests BOTTLES DRAWN AEROBIC AND ANAEROBIC 5CC EACH   Final   Culture  Setup Time     Final   Value: 10/06/2013 08:56     Performed at [facility]   Culture     Final   Value:        BLOOD CULTURE RECEIVED NO GROWTH TO DATE CULTURE WILL BE HELD FOR 5 DAYS BEFORE ISSUING A FINAL NEGATIVE REPORT     Performed at [facility]   Report Status PENDING   Incomplete  MRSA PCR SCREENING     Status: None   Collection Time    10/05/13  7:36 PM      Result Value Range Status   MRSA by PCR NEGATIVE  NEGATIVE Final   Comment:            The GeneXpert MRSA Assay (FDA     approved for NASAL specimens     only), is one component of a     comprehensive MRSA colonization     surveillance program. It is not     intended to diagnose MRSA     infection nor to guide or     monitor treatment for     MRSA infections.  CULTURE, BLOOD (ROUTINE X 2)     Status: None   Collection Time    10/05/13  9:08 PM      Result Value Range Status   Specimen Description BLOOD LEFT ANTECUBITAL   Final   Special Requests BOTTLES DRAWN AEROBIC AND ANAEROBIC 10CC EA   Final   Culture  Setup Time     Final   Value: 10/06/2013 08:56     Performed at [facility]   Culture     Final   Value:        BLOOD CULTURE RECEIVED NO GROWTH TO DATE CULTURE WILL BE HELD FOR 5 DAYS BEFORE ISSUING A FINAL NEGATIVE REPORT     Performed at [facility]   Report Status PENDING   Incomplete  URINE CULTURE     Status: None   Collection Time    10/07/13 12:18 AM      Result Value Range Status   Specimen Description URINE, RANDOM   Final   Special Requests NONE   Final   Culture  Setup Time     Final   Value: 10/07/2013 00:35     Performed at [facility]  Colony Count     Final   Value: NO GROWTH     Performed at [facility]   Culture     Final   Value: NO GROWTH      Performed at [facility]   Report Status 10/08/2013 FINAL   Final   Studies/Results: Dg Neck Soft Tissue  10/08/2013   CLINICAL DATA:  Dysphagia.  EXAM: NECK SOFT TISSUES - 1+ VIEW  COMPARISON:  None.  FINDINGS: There is no evidence of retropharyngeal soft tissue swelling or epiglottic enlargement. The cervical airway is unremarkable and no radio-opaque foreign body identified. Tongue base is normal. Minimal anterior osteophytes in the lower cervical spine. No disc space narrowing.  IMPRESSION: No significant abnormality.   Electronically Signed   By: Geanie Cooley M.D.   On: 10/08/2013 17:40   Medications: I have reviewed the patient's current medications. Scheduled Meds: . fentaNYL  50 mcg Transdermal Q72H  . heparin  5,000 Units Subcutaneous Q8H  . Oxcarbazepine  300 mg Oral BID  . pantoprazole  40 mg Oral Daily  . pregabalin  300 mg Oral TID  . sodium chloride  3 mL Intravenous Q12H   Continuous Infusions:  PRN Meds:.acetaminophen, docusate sodium, levalbuterol, pneumococcal 23 valent vaccine, polyethylene glycol, promethazine, rizatriptan, traMADol Assessment/Plan:  Assessment: 53 year old woman with past medical history of who presented on 1/18 with sepsis and found to have community acquired pneumonia complicated by acute respiratory failure.   Plan:   Community Acquired Pneumonia complicated by sepsis and hypoxia  -  Clinically improving with improving leukocytosis, tachypnea and tachycardia, currently 96% on 2L [place] O2. Chest xray on 1/18 with increased interstitial and alveolar densities bilaterally, predominantly on the right consistent with pneumonia. Pt found to be influenza, HIV, legionella, and strep pneuomonaie negative.  -Oxygen therapy to keep SpO2 >92%, wean as tolerated -Ambulate every shift with pulse oximetry--> today 85% improved from 70% yesterday with ambulation -Obtain vital signs Q4 hr -Transition from IV levofloxacin to PO 750 daily (total Day 5/5  antibiotics) due to improvement  in respiratory status -Levalbuterol Q4 hr PRN, caution if tachycardic  -Acetaminphen PRN fever -Blood cultures x2 --> NGTD -Leukocytosis  resolved -If no improvement in oxygen requirements, consider obtaining ABG, repeat CXR tomm -PT consult ---> home health PT, rolling walker  -Consider outpatient PFTs due to history of smoking -Follow-up imaging in 4-6 weeks for resolution of pneumonia   Dysphagia - Pt with 1 year history of dysphagia to solids with feeling of food stuck in throat. Oropharyngeal vs esophageal obstruction (stricture from chronic GERD, ring, web, malignancy). Pt also with weight loss and anemia concerning for alarm symptoms of GERD.   -Obtain speech evaluation dur to concern for aspiration PNA  --> dysphagia 3, possibly primary esophageal dysphagia  -Obtain XR soft tissue neck --> wnl -Consider barium swallow, ENT/GI referral   Normocytic Anemia - stable with no active bleeding or hemodynamic instability. Pt with Hg of admission on 10.9 with unknown baseline, currently 9.6. Etiology consistent with ACD.  -Monitor for bleeding -Transfuse pRBCs to keep Hg>7 -Obtain anemia panel --> vit B12 (wnl), folate (2.6 L), iron (14 L), ferritin ( 533 H), TIBC (137 L), reticulocyte (wnl) -Obtain FOBT to assess for GI bleeding   Seizure Disorder - no reported seizure activity. Pt with reported seizure disorder on oxcarbazepine.  -Seizure precautions  -Continue home oxycarbazepine 300 mg BID -Limit sedative medidations  Fibromyalgia and Chronic Pain Syndrome - Pt with body aches and pain that  is reported to be different form chronic pain most likely due to CAP. UDS was negative -Contine home fentanyl patch 50 mcg Q 3 days  -Continue home rizatriptan 10 mg daily PRN   -Continue home tramadol 50 mg Q6 hr PRN -Continue home pregabalin 300 mg -Avoid home etodolac 500 mg TID in setting of epigastric pain and possible gastritis  -Obtain physical therapy  consult --> home health PT and rolling walker  Epigastric Pain in setting of GERD - improved. etiology unknown, most likely due to gastritis (chronic etodolac use) vs PUD vs GERD. Lipase was within normal limits.  -Protonix 40 mg daily  -Promethazine 12.5 mg Q6 hr PRN -Avoid home etodolac 500 mg TID -Consider change in  PPI therapy (on prevacid BID) on discharge -Consider H pylori stool antigen   Leukocytosis -resolved. Pt with WBC of 28K on admission, today 9.7K Etiology most likely due to CAP.  -Blood cultures x 2 --> NGTD -Urine culture --> no growth  -Continue to monitor CBC  Hypokalemia - resolved. Pt with potassium on 1/20 of 3.1. Mg was within normal limits. Etiology most likely due to decreased PO intake.  -Replace with PO KCl as needed -Continue to monitor  Constipation - resolved.  Pt reports no recent BM with normal passage of gas. -Miralax PRN daily -Colace PRN daily  -Replace electrolytes as needed  Prolonged QT - resolved. Pt with QTc of 512 with repeat QTc of 429 on 1/19. -Avoid QT prolonging medications -Obtain TSH --> wnl   Diet: Dysphagia 3 DVT Ppx: SQ Heparin TID Code: Full  Dispo: Disposition is deferred at this time, awaiting improvement of current medical problems.  Anticipated discharge in approximately 3-5 day(s).   The patient does not have a current PCP (No Pcp Per Patient) and does need an [facility] hospital follow-up appointment after discharge.  The patient does not have transportation limitations that hinder transportation to clinic appointments.  .Services Needed at time of discharge: Y = Yes, Blank = No PT:   OT:   RN:   Equipment:   Other:     LOS: 4 days   Otis Brace, MD 10/09/2013, 11:37 AM

## 2013-10-10 MED ORDER — RIZATRIPTAN BENZOATE 10 MG PO TBDP: 5.0000 mg | ORAL_TABLET | Freq: Once | ORAL | Status: DC | PRN

## 2013-10-10 MED ORDER — PREGABALIN 50 MG PO CAPS: 100.0000 mg | ORAL_CAPSULE | Freq: Three times a day (TID) | ORAL | Status: DC

## 2013-10-10 MED ORDER — RIZATRIPTAN BENZOATE 5 MG PO TBDP
5.0000 mg | ORAL_TABLET | Freq: Once | ORAL | Status: AC | PRN
  Administered 2013-10-10: 5 mg via ORAL
  Filled 2013-10-10: qty 1

## 2013-10-10 MED ORDER — FENTANYL 50 MCG/HR TD PT72: 50.0000 ug | MEDICATED_PATCH | TRANSDERMAL | Status: DC

## 2013-10-10 MED ORDER — TRAMADOL HCL 50 MG PO TABS
50.0000 mg | ORAL_TABLET | Freq: Two times a day (BID) | ORAL | Status: DC | PRN
Start: 2013-10-10 — End: 2013-11-10

## 2013-10-10 MED ORDER — PREGABALIN 300 MG PO CAPS: 300.0000 mg | ORAL_CAPSULE | Freq: Two times a day (BID) | ORAL | Status: DC

## 2013-10-10 MED ORDER — LEVOFLOXACIN 750 MG PO TABS: 750.0000 mg | ORAL_TABLET | Freq: Every day | ORAL | Status: DC

## 2013-10-10 MED ORDER — OXCARBAZEPINE 300 MG PO TABS: 300.0000 mg | ORAL_TABLET | Freq: Two times a day (BID) | ORAL | Status: DC

## 2013-10-10 MED ORDER — POLYETHYLENE GLYCOL 3350 17 G PO PACK: 17.0000 g | PACK | Freq: Every day | ORAL | Status: DC | PRN

## 2013-10-10 MED ORDER — PREGABALIN 50 MG PO CAPS
300.0000 mg | ORAL_CAPSULE | Freq: Two times a day (BID) | ORAL | Status: DC
  Administered 2013-10-10: 300 mg via ORAL
  Filled 2013-10-10: qty 6

## 2013-10-10 MED ORDER — PANTOPRAZOLE SODIUM 40 MG PO TBEC: 40.0000 mg | DELAYED_RELEASE_TABLET | Freq: Every day | ORAL | Status: DC

## 2013-10-10 MED ORDER — DSS 100 MG PO CAPS: 100.0000 mg | ORAL_CAPSULE | Freq: Every day | ORAL | Status: DC | PRN

## 2013-10-10 MED ORDER — LEVOFLOXACIN 750 MG PO TABS
750.0000 mg | ORAL_TABLET | Freq: Every day | ORAL | Status: DC
  Administered 2013-10-10: 750 mg via ORAL
  Filled 2013-10-10: qty 1

## 2013-10-10 NOTE — Progress Notes (Signed)
Patient discharge teaching given, including activity, diet, follow-up appoints, and medications. Patient verbalized understanding of all discharge instructions. IV access was d/c'd. Vitals are stable. Skin is intact except as charted in most recent assessments. Pt to be escorted out by NT, to be driven home by family.  Resha Filippone, MBA, BS, RN 

## 2013-10-10 NOTE — Progress Notes (Addendum)
Subjective:   Pt seen and examined in AM. No acute events overnight. Pt reports her breathing has improved and still with cough, nausea, swallowing difficulty and mild epigastric pain that is unchanged. She is still requiring oxygen but would like to ambulate without oxygen. No change in BM or urination    Objective: Vital signs in last 24 hours: Filed Vitals:   10/09/13 1729 10/09/13 2108 10/10/13 0437 10/10/13 0900  BP: 99/67 112/56 96/63 96/53   Pulse: 78 78 73 63  Temp: 99.3 F (37.4 C) 98.7 F (37.1 C) 98 F (36.7 C) 98.1 F (36.7 C)  TempSrc: Oral Oral Oral Oral  Resp: 20 20 18 20   Height:      Weight:  60.601 kg (133 lb 9.6 oz)    SpO2: 92% 97% 97% 97%   Weight change: 0 kg (0 lb)  Intake/Output Summary (Last 24 hours) at 10/10/13 1244 Last data filed at 10/10/13 4098  Gross per 24 hour  Intake   1200 ml  Output    200 ml  Net   1000 ml   Physical Exam  Constitutional: She is oriented to person, place, and time. She appears well-developed and well-nourished. No distress.  HENT:  Head: Normocephalic and atraumatic.  Neck: Neck supple.   Cardiovascular: Normal rate, regular rhythm, normal heart sounds. No murmur heard.  Pulmonary/Chest: Breathing on 2L [place]. She has no wheezes. She has normal breath sounds. She exhibits no tenderness.  Abdominal: Soft. Bowel sounds are normal. She exhibits no distension. There is epigastric tenderness. There is no rebound.  Neurological: She is alert and oriented to person, place, and time.  Skin: She is not diaphoretic.  Psychiatric:    Lab Results: Basic Metabolic Panel:  Recent Labs Lab 10/05/13 1550 10/07/13 0300 10/08/13 0520  NA 142 140 140  K 4.6 3.1* 4.6  CL 101 104 104  CO2 25 24 24   GLUCOSE 136* 102* 110*  BUN 22 8 9   CREATININE 0.81 0.67 0.79  CALCIUM 8.7 8.5 8.8  MG  --  1.7  --    Liver Function Tests: No results found for this basename: AST, ALT, ALKPHOS, BILITOT, PROT, ALBUMIN,  in the last 168  hours  Recent Labs Lab 10/06/13 0025  LIPASE 9*   No results found for this basename: AMMONIA,  in the last 168 hours CBC:  Recent Labs Lab 10/05/13 1550  10/08/13 0520 10/09/13 0400  WBC 28.0*  < > 11.8* 9.7  NEUTROABS 25.3*  --   --   --   HGB 10.9*  < > 9.7* 9.6*  HCT 34.3*  < > 29.7* 30.1*  MCV 91.7  < > 88.9 89.1  PLT 408*  < > 345 359  < > = values in this interval not displayed. Cardiac Enzymes: No results found for this basename: CKTOTAL, CKMB, CKMBINDEX, TROPONINI,  in the last 168 hours BNP:  Recent Labs Lab 10/05/13 2108  PROBNP 465.7*   D-Dimer: No results found for this basename: DDIMER,  in the last 168 hours CBG: No results found for this basename: GLUCAP,  in the last 168 hours Hemoglobin A1C: No results found for this basename: HGBA1C,  in the last 168 hours Fasting Lipid Panel: No results found for this basename: CHOL, HDL, LDLCALC, TRIG, CHOLHDL, LDLDIRECT,  in the last 168 hours Thyroid Function Tests:  Recent Labs Lab 10/05/13 2108  TSH 0.677   Coagulation: No results found for this basename: LABPROT, INR,  in the  last 168 hours Anemia Panel:  Recent Labs Lab 10/07/13 0300  VITAMINB12 231  FOLATE 2.6*  FERRITIN 535*  TIBC 137*  IRON 14*  RETICCTPCT 2.3   Urine Drug Screen: Drugs of Abuse     Component Value Date/Time   LABOPIA NONE DETECTED 10/07/2013 0018    Alcohol Level: No results found for this basename: ETH,  in the last 168 hours Urinalysis:  Recent Labs Lab 10/05/13 1645 10/05/13 2345  COLORURINE YELLOW YELLOW  LABSPEC 1.024 1.021  PHURINE 6.0 6.0  GLUCOSEU NEGATIVE NEGATIVE  HGBUR NEGATIVE NEGATIVE  BILIRUBINUR LARGE* LARGE*  KETONESUR NEGATIVE NEGATIVE  PROTEINUR 30* NEGATIVE  UROBILINOGEN 1.0 1.0  NITRITE NEGATIVE NEGATIVE  LEUKOCYTESUR NEGATIVE MODERATE*   Micro Results: Recent Results (from the past 240 hour(s))  CULTURE, BLOOD (ROUTINE X 2)     Status: None   Collection Time    10/05/13  5:49  PM      Result Value Range Status   Specimen Description BLOOD RIGHT ARM   Final   Special Requests BOTTLES DRAWN AEROBIC AND ANAEROBIC 5CC EACH   Final   Culture  Setup Time     Final   Value: 10/06/2013 08:56     Performed at [facility]   Culture     Final   Value:        BLOOD CULTURE RECEIVED NO GROWTH TO DATE CULTURE WILL BE HELD FOR 5 DAYS BEFORE ISSUING A FINAL NEGATIVE REPORT     Performed at [facility]   Report Status PENDING   Incomplete  MRSA PCR SCREENING     Status: None   Collection Time    10/05/13  7:36 PM      Result Value Range Status   MRSA by PCR NEGATIVE  NEGATIVE Final   Comment:            The GeneXpert MRSA Assay (FDA     approved for NASAL specimens     only), is one component of a     comprehensive MRSA colonization     surveillance program. It is not     intended to diagnose MRSA     infection nor to guide or     monitor treatment for     MRSA infections.  CULTURE, BLOOD (ROUTINE X 2)     Status: None   Collection Time    10/05/13  9:08 PM      Result Value Range Status   Specimen Description BLOOD LEFT ANTECUBITAL   Final   Special Requests BOTTLES DRAWN AEROBIC AND ANAEROBIC 10CC EA   Final   Culture  Setup Time     Final   Value: 10/06/2013 08:56     Performed at [facility]   Culture     Final   Value:        BLOOD CULTURE RECEIVED NO GROWTH TO DATE CULTURE WILL BE HELD FOR 5 DAYS BEFORE ISSUING A FINAL NEGATIVE REPORT     Performed at [facility]   Report Status PENDING   Incomplete  URINE CULTURE     Status: None   Collection Time    10/07/13 12:18 AM      Result Value Range Status   Specimen Description URINE, RANDOM   Final   Special Requests NONE   Final   Culture  Setup Time     Final   Value: 10/07/2013 00:35     Performed at [facility]  Colony Count     Final   Value: NO GROWTH     Performed at [facility]   Culture     Final   Value: NO GROWTH     [facility]formed at [facility]   Report Status 10/08/2013 FINAL   Final   Studies/Results: Dg Neck Soft Tissue  10/08/2013   CLINICAL DATA:  Dysphagia.  EXAM: NECK SOFT TISSUES - 1+ VIEW  COMPARISON:  None.  FINDINGS: There is no evidence of retropharyngeal soft tissue swelling or epiglottic enlargement. The cervical airway is unremarkable and no radio-opaque foreign body identified. Tongue base is normal. Minimal anterior osteophytes in the lower cervical spine. No disc space narrowing.  IMPRESSION: No significant abnormality.   Electronically Signed   By: Geanie CooleyJim  Maxwell M.D.   On: 10/08/2013 17:40   Medications: I have reviewed the patient's current medications. Scheduled Meds: . fentaNYL  50 mcg Transdermal Q72H  . heparin  5,000 Units Subcutaneous Q8H  . levofloxacin  750 mg Oral Daily  . Oxcarbazepine  300 mg Oral BID  . pantoprazole  40 mg Oral Daily  . pregabalin  300 mg Oral TID  . sodium chloride  3 mL Intravenous Q12H   Continuous Infusions:  PRN Meds:.acetaminophen, docusate sodium, levalbuterol, pneumococcal 23 valent vaccine, polyethylene glycol, promethazine, rizatriptan, traMADol Assessment/Plan:  Assessment: 53 year old woman with past medical history of who presented on 1/18 with sepsis and found to have community acquired pneumonia complicated by acute respiratory failure.   Plan:   Community Acquired Pneumonia complicated by sepsis and hypoxia  -  Clinically improving with improving leukocytosis, tachypnea and tachycardia, currently 97% on 2L [place] O2. Chest xray on 1/18 with increased interstitial and alveolar densities bilaterally, predominantly on the right consistent with pneumonia. Pt found to be influenza, HIV, legionella, and strep pneuomonaie negative.  -Oxygen therapy to keep SpO2 >92%, wean as tolerated -Ambulate every shift with pulse oximetry--> today 91% improved from 85% yesterday with ambulation -Obtain vital signs Q4 hr -Completed 5 days of Levaquin antibiotic  therapy -Levalbuterol Q4 hr PRN, caution if tachycardic  -Acetaminphen PRN fever -Blood cultures x2 --> NGTD -Leukocytosis  resolved -If no improvement in oxygen requirements, consider obtaining ABG, repeat CXR tomm -PT consult ---> home health PT, rolling walker  -Consider outpatient PFTs due to history of smoking -Follow-up imaging in 4-6 weeks for resolution of pneumonia  Dysphagia - Pt with 1 year history of dysphagia to solids with feeling of food stuck in throat. Oropharyngeal vs esophageal obstruction (stricture from chronic GERD, ring, web, malignancy). Pt also with weight loss and anemia concerning for alarm symptoms of GERD.   -Obtain speech evaluation dur to concern for aspiration PNA  --> dysphagia 3, possibly primary esophageal dysphagia  -Obtain XR soft tissue neck --> wnl -Consider barium swallow, ENT/GI referral   Normocytic Anemia - stable with no active bleeding or hemodynamic instability. Pt with Hg of admission on 10.9 with unknown baseline, currently 9.6. Etiology consistent with ACD.  -Monitor for bleeding -Transfuse pRBCs to keep Hg>7 -Obtain anemia panel --> vit B12 (wnl), folate (2.6 L), iron (14 L), ferritin ( 533 H), TIBC (137 L), reticulocyte (wnl) -Obtain FOBT to assess for GI bleeding   Seizure Disorder - no reported seizure activity. Pt with reported seizure disorder on oxcarbazepine.  -Seizure precautions  -Continue home oxycarbazepine 300 mg BID -Limit sedative medidations  Fibromyalgia and Chronic Pain Syndrome - Pt with body aches and pain that is reported to be different  form chronic pain most likely due to CAP. UDS was negative -Contine home fentanyl patch 50 mcg Q 3 days  -Continue home rizatriptan 10 mg daily PRN   -Continue home tramadol 50 mg Q6 hr PRN -Continue home pregabalin 300 mg -Avoid home etodolac 500 mg TID in setting of epigastric pain and possible gastritis  -Obtain physical therapy consult --> home health PT and rolling  walker  Epigastric Pain in setting of GERD - improved. etiology unknown, most likely due to gastritis (chronic etodolac use) vs PUD vs GERD. Pt on prevacid BID at home. Lipase was within normal limits.  -Protonix 40 mg daily  -Promethazine 12.5 mg Q6 hr PRN -Avoid home etodolac 500 mg TID   Leukocytosis -resolved. Pt with WBC of 28K on admission, today 9.7K Etiology most likely due to CAP.  -Blood cultures x 2 --> NGTD -Urine culture --> no growth  -Continue to monitor CBC  Hypokalemia - resolved. Pt with potassium on 1/20 of 3.1. Mg was within normal limits. Etiology most likely due to decreased PO intake.  -Replace with PO KCl as needed -Continue to monitor  Constipation - resolved.  Pt reports no recent BM with normal passage of gas. -Miralax PRN daily -Colace PRN daily  -Replace electrolytes as needed  Prolonged QT - resolved. Pt with QTc of 512 with repeat QTc of 429 on 1/19. -Avoid QT prolonging medications -Obtain TSH --> wnl   Diet: Dysphagia 3 DVT Ppx: SQ Heparin TID Code: Full Dispo: Today  The patient does not have a current PCP (No Pcp Per Patient) and does need an [facility] hospital follow-up appointment after discharge.  The patient does not have transportation limitations that hinder transportation to clinic appointments.  .Services Needed at time of discharge: Y = Yes, Blank = No PT:   OT:   RN:   Equipment:   Other:     LOS: 5 days   Otis Brace, MD 10/10/2013, 12:44 PM

## 2013-10-10 NOTE — Progress Notes (Signed)
I have read and agree with [name]'s note. Please see my note as well.

## 2013-10-10 NOTE — Progress Notes (Signed)
SATURATION QUALIFICATIONS: (This note is used to comply with regulatory documentation for home oxygen)  Patient Saturations on Room Air at Rest = 96%  Patient Saturations on Room Air while Ambulating = 91%     Peri MarisAndrew Andreia Gandolfi, MBA, [employer], RN

## 2013-10-10 NOTE — Progress Notes (Signed)
Medical Student Daily Progress Note  Subjective: Pt had no acute events overnight. She reports that she is breathing better and feels almost back to baseline. She denies SOB at rest. She had no nausea, vomiting, diarrhea or constipation overnight.   Objective: Vital signs in last 24 hours: Filed Vitals:   10/09/13 1729 10/09/13 2108 10/10/13 0437 10/10/13 0900  BP: 99/67 112/56 96/63 96/53   Pulse: 78 78 73 63  Temp: 99.3 F (37.4 C) 98.7 F (37.1 C) 98 F (36.7 C) 98.1 F (36.7 C)  TempSrc: Oral Oral Oral Oral  Resp: 20 20 18 20   Height:      Weight:  60.601 kg (133 lb 9.6 oz)    SpO2: 92% 97% 97% 97%   Weight change: 0 kg (0 lb)  Intake/Output Summary (Last 24 hours) at 10/10/13 1233 Last data filed at 10/10/13 5621  Gross per 24 hour  Intake   1200 ml  Output    200 ml  Net   1000 ml   Physical Exam: Gen: alert, in no acute distress  HENT:  Head: atraumatic, normocephalic  Eyes: bilateral cloudy corneas, PERRL  Oral/Throat: no thyromegaly, no erythema, no oropharyngeal exudates. Bumps visible on back of tongue  CV: RRR  Pulm: better breath movement than yesterday, no crackles, wheezes or rales  Abd: epigastric tenderness present, negative murphy sign  Pulses: radial pulses 2+ bilaterally  Skin: warm and dry  Lab Results:  Recent Labs Lab 10/06/13 0025  LIPASE 9*   CBC:  Recent Labs Lab 10/05/13 1550 10/06/13 1708 10/07/13 0300 10/08/13 0520 10/09/13 0400  WBC 28.0* 18.9* 14.6* 11.8* 9.7  NEUTROABS 25.3*  --   --   --   --   HGB 10.9* 10.5* 9.9* 9.7* 9.6*  HCT 34.3* 31.5* 30.2* 29.7* 30.1*  MCV 91.7 88.2 88.8 88.9 89.1  PLT 408* 374 340 345 359    Micro Results: Recent Results (from the past 240 hour(s))  CULTURE, BLOOD (ROUTINE X 2)     Status: None   Collection Time    10/05/13  5:49 PM      Result Value Range Status   Specimen Description BLOOD RIGHT ARM   Final   Special Requests BOTTLES DRAWN AEROBIC AND ANAEROBIC 5CC EACH   Final   Culture  Setup Time     Final   Value: 10/06/2013 08:56     Performed at [facility]   Culture     Final   Value:        BLOOD CULTURE RECEIVED NO GROWTH TO DATE CULTURE WILL BE HELD FOR 5 DAYS BEFORE ISSUING A FINAL NEGATIVE REPORT     Performed at [facility]   Report Status PENDING   Incomplete  MRSA PCR SCREENING     Status: None   Collection Time    10/05/13  7:36 PM      Result Value Range Status   MRSA by PCR NEGATIVE  NEGATIVE Final   Comment:            The GeneXpert MRSA Assay (FDA     approved for NASAL specimens     only), is one component of a     comprehensive MRSA colonization     surveillance program. It is not     intended to diagnose MRSA     infection nor to guide or     monitor treatment for     MRSA infections.  CULTURE, BLOOD (ROUTINE X 2)  Status: None   Collection Time    10/05/13  9:08 PM      Result Value Range Status   Specimen Description BLOOD LEFT ANTECUBITAL   Final   Special Requests BOTTLES DRAWN AEROBIC AND ANAEROBIC 10CC EA   Final   Culture  Setup Time     Final   Value: 10/06/2013 08:56     Performed at [facility]   Culture     Final   Value:        BLOOD CULTURE RECEIVED NO GROWTH TO DATE CULTURE WILL BE HELD FOR 5 DAYS BEFORE ISSUING A FINAL NEGATIVE REPORT     Performed at [facility]   Report Status PENDING   Incomplete  URINE CULTURE     Status: None   Collection Time    10/07/13 12:18 AM      Result Value Range Status   Specimen Description URINE, RANDOM   Final   Special Requests NONE   Final   Culture  Setup Time     Final   Value: 10/07/2013 00:35     Performed at [facility] Count     Final   Value: NO GROWTH     Performed at [facility]   Culture     Final   Value: NO GROWTH     Performed at [facility]   Report Status 10/08/2013 FINAL   Final   Studies/Results: Dg Neck Soft Tissue  10/08/2013   CLINICAL DATA:  Dysphagia.  EXAM: NECK SOFT  TISSUES - 1+ VIEW  COMPARISON:  None.  FINDINGS: There is no evidence of retropharyngeal soft tissue swelling or epiglottic enlargement. The cervical airway is unremarkable and no radio-opaque foreign body identified. Tongue base is normal. Minimal anterior osteophytes in the lower cervical spine. No disc space narrowing.  IMPRESSION: No significant abnormality.   Electronically Signed   By: Geanie Cooley M.D.   On: 10/08/2013 17:40   Medications: Scheduled Meds: . fentaNYL  50 mcg Transdermal Q72H  . heparin  5,000 Units Subcutaneous Q8H  . levofloxacin  750 mg Oral Daily  . Oxcarbazepine  300 mg Oral BID  . pantoprazole  40 mg Oral Daily  . pregabalin  300 mg Oral TID  . sodium chloride  3 mL Intravenous Q12H   Continuous Infusions:  PRN Meds:.acetaminophen, docusate sodium, levalbuterol, pneumococcal 23 valent vaccine, polyethylene glycol, promethazine, rizatriptan, traMADol  Assessment/Plan: Community Acquired Pneumonia with sepsis - Pt has now been afebrile for 3 days with resolving white count (last WBC: 9.2, with 28 on admission). Her tachypnea and tachycardia have resolved. She was able to tolerate ambulating with mid-90s O2 stats. Given her severe presentation at onset, will err on the conservative side with her antibiotic therapy and extend her coverage to a total a total of 7 days.  -Hold O2 therapy and re-assess oxygenation status in afternoon -Give 2 more doses of Levofloxacin 750 mg -Levalbuterol q4h prn  -Acetaminophen prn fever  -Blood cultures: NGTD final -Urine cultures: NGTD  -Follow up CXR in 7-12 weeks to ensure resolution and rule out underlying process such as malignancy  -Consider outpatient PFTs if patient presents with recurrent pneumonia, continued dyspnea on exertion, cough   #Dysphagia: Pt is no longer complaining of pain with swallowing above baseline. Based on swallow eval, she may have primary esophageal dysphagia (pt grimaced during swallowing). There is no  concern for aspiration. -Possible f/u with ENT for evaluation of esophageal  dysphagia -Soft tissue neck CXR: negative for retropharyngeal swelling or epiglottitis  --Dysphagia 3 diet  #Epigastric Pain - Pt's epigastric pain has improved with Protonix and is therefore most likely 2/2 to GERD. Lipase was wnl.  -Continue Protonix 40 mg daily  -Promethazine 12.5 mg Q6 hr PRN  -Will tell PCP to consider PPI therapy on discharge  #Hypokalemia: Pt with K: 3.1 on 1/19. Resolved on 1/21 with K of 4.6.   #Normocytic Anemia - Hb 10.9 on admission and 9.6 on last CBC. Pt has no active bleeding or hemodynamic instability. Ferritin 535 (high), folate (2.6) low, iron (14), TIBC (137) and Vitamin B12 normal. This fits the clinical picture of Anemia of Chronic Disease.  -Monitor for bleeding   #Seizure Disorder - Pt does not report any seizures. Seizure activity  are stable and does not report any seizure activity.no reported seizure activity. Pt with reported seizure disorder on oxcarbazepine.  -Seizure precautions  -Continue home oxycarbazepine 300 mg BID  -Limit sedative medidations   #Fibromyalgia and Chronic Pain Syndrome - Pt says body aches are still worse than at home. Worsened body ache thought to be due to CAP.  -Contine home fentanyl patch 50 mcg Q 3 days  -Continue home rizatriptan 10 mg daily PRN  -Continue home tramadol 50 mg Q6 hr PRN  -Continue home pregabalin 300 mg  -Hold etodolac 500 mg TID because of concern for gastritis   #Prolonged QT - resolved. Pt with QTc of 512 with repeat QTc of 429 on 1/19.  -Avoid QT prolonging medications  -Obtain TSH   #Constipation - Pt reports 1 BM today s/p Miralax.  -Continue Miralax PRN daily  -Colace PRN daily  -Replace electrolytes as needed  This is a Psychologist, occupationalMedical Student Note.  The care of the patient was discussed with Dr. Johna Rolesabbani and the assessment and plan formulated with their assistance.  Please see their attached note for official  documentation of the daily encounter.  Willette Almaannu, Laiklynn Raczynski 10/10/2013, 12:33 PM

## 2013-10-10 NOTE — Discharge Instructions (Signed)
Dear [name], thank you for letting us take care of you this hospital admission.   Please complete your total course of antibiotics tomorrow, you need to take 1 more pill of levaquin 750mg  by mouth tomorrow and then you are done  Please rest and keep up your food and fluid intake and advance your activity as tolerated  If your condition worsens, you have worsening sob, fever, chills, chest pain, call your PCP office or go directly to ED.  You have been [facility] a [facility].  You have an interim follow up appointment with our clinic on 10/15/13 incase you cannot see your PCP sooner.   You will need a follow up cxr in 4-6 weeks  Neumona en el adulto (Pneumonia, Adult) La neumona es una infeccin en los pulmones. Puede ser causado por un germen (virus o bacteria). Algunos tipos de neumona pueden contagiarse fcilmente a Economistotras personas. Esto puede ocurrir al toser o Engineering geologistestornudar. CUIDADOS EN EL HOGAR  Tome slo la medicacin que le indic el profesional que lo asiste.  Tome los medicamentos (antibiticos) tal como se le indic. Finalice la prescripcin completa, aunque se sienta mejor.  No fume.  Puede utilizar un humidificador o vaporizador en la habitacin. Esto puede ayudar a Film/video editoraflojar la mucosidad.  Para reducir la tos, duerma de forma tal que usted estn en posicin casi sentada (semi-vertical).  Descanse. Existe una vacuna que puede prevenir neumona. Se recomiendan inyecciones en caso de:  Personas de 65 aos o ms.  Pacientes de quimioterapia.  Personas con problemas pulmonares a largo plazo (crnicos).  Personas con problemas del sistema inmunolgico. SOLICITE AYUDA DE INMEDIATO SI:  Siente que empeora.  No puede controlar la tos, y no puede dormir.  Usted escupen sangre al toser.  El dolor Seven Cornersempeora, incluso con los medicamentos.  Tiene fiebre.  Los problemas empeoran en vez de Scientist, clinical (histocompatibility and immunogenetics)mejorar.  Usted sienten falta de aire o dolor en el  pecho. ASEGRESE QUE:  Comprende estas instrucciones.  Controlar su enfermedad.  Solicitar ayuda inmediatamente si no mejora o si empeora. Document Released: 02/22/2010 Document Revised: 11/27/2011 [facility] Patient Information 2014 [place], [place].

## 2013-10-10 NOTE — Progress Notes (Signed)
   CARE MANAGEMENT NOTE 10/10/2013  Patient:  [name] [name],[name] [name]   Account Number:  [id number]  Date Initiated:  10/07/2013  Documentation initiated by:  Marvetta Gibbons  Subjective/Objective Assessment:   Pt admitted with CAP     Action/Plan:   PTA pt lived at home with relatives- NCM to follow for d/c needs   Anticipated DC Date:  10/09/2013   Anticipated DC Plan:  [place]  CM consult      Choice offered to / List presented to:     DME arranged  [place]           Status of service:  Completed, signed off Medicare Important Message given?   (If response is "NO", the following Medicare IM given date fields will be blank) Date Medicare IM given:   Date Additional Medicare IM given:    Discharge Disposition:    Per UR Regulation:  Reviewed for med. necessity/level of care/duration of stay  If discussed at [place] of Stay Meetings, dates discussed:    Comments:  10/10/2013  [place], [place]:  appointment 10/30/2013 at 10:00 am Home Health PT: not eligible with Medicaid, MD aware DME: rolling walker w/wheels.  [facility] with referral to meet with patient   10/08/2013  1500 Lizabeth Leyden RN,CCM  [phone] CM referral: need PCP  Met with patient regarding discharge planning.  She recently moved to [place], lives with her sister and mother. She does not have a PCP, went to Urgent care prior to coming to hospital. Her sister has a car and can drive her to appointments. She does not have insurance, applied for food stamps and Medicaid. NCM to continue to follow for discharge needs.  Financial Counselor/Maddie [phone] called regarding Medicaid application status. She will email the case manager and update NCM.  [place] called and left voice mail message requesting appointment post hospital discharge.

## 2013-10-10 NOTE — Progress Notes (Signed)
Internal Medicine Attending  Date: 10/10/2013  Patient name: [name] [name] Medical record number: [id number] Date of birth: 05/20/1961 Age: 53 y.o. Gender: female  I saw and evaluated the patient, and discussed her care with housestaff.  I reviewed the resident's note by Dr. Johna Rolesabbani and I agree with the resident's findings and plans as documented in her note, with the following additional comments.  Patient is afebrile with stable vital signs; her oxygen saturation on room air was over 90% with ambulation.  Anticipate discharge today with a plan to complete a total 7 day course of levofloxacin, with close followup in clinic next week.

## 2013-10-12 LAB — CULTURE, BLOOD (ROUTINE X 2)
Culture: NO GROWTH
Culture: NO GROWTH

## 2013-10-12 NOTE — Discharge Summary (Signed)
Name: [name] [name] MRN: [id number] DOB: 11/04/1960 53 y.o. PCP: No Pcp Per Patient  Date of Admission: 10/05/2013  2:24 PM Date of Discharge: 10/10/13 Attending Physician: Farley LyJerry Dale Joines, MD  Discharge Diagnosis: Primary: Community Acquired Pneumonia complicated by sepsis and hypoxia   Dysphagia  Normocytic Anemia  Seizure Disorder Fibromyalgia and Chronic Pain SyndromeEpigastric Pain in setting of GERD  Leukocytosis  Hypokalemia  Constipation  Lower extremity edema  Prolonged QT   DVT Prophylaxis     Discharge Medications:   Medication List         DSS 100 MG Caps  Take 100 mg by mouth daily as needed for mild constipation.     etodolac 500 MG tablet  Commonly known as:  LODINE  Take 500 mg by mouth 3 (three) times daily.     fentaNYL 50 MCG/HR  Commonly known as:  DURAGESIC - dosed mcg/hr  Place 1 patch (50 mcg total) onto the skin every 3 (three) days.     levofloxacin 750 MG tablet  Commonly known as:  LEVAQUIN  Take 1 tablet (750 mg total) by mouth daily.     Oxcarbazepine 300 MG tablet  Commonly known as:  TRILEPTAL  Take 1 tablet (300 mg total) by mouth 2 (two) times daily.     pantoprazole 40 MG tablet  Commonly known as:  PROTONIX  Take 1 tablet (40 mg total) by mouth daily.     polyethylene glycol packet  Commonly known as:  MIRALAX / GLYCOLAX  Take 17 g by mouth daily as needed for mild constipation or moderate constipation.     pregabalin 300 MG capsule  Commonly known as:  LYRICA  Take 1 capsule (300 mg total) by mouth 2 (two) times daily.     rizatriptan 10 MG tablet  Commonly known as:  MAXALT  Take 10 mg by mouth daily as needed for migraine. May repeat in 2 hours if needed     traMADol 50 MG tablet  Commonly known as:  ULTRAM  Take 1 tablet (50 mg total) by mouth every 12 (twelve) hours as needed for moderate pain.        Disposition and follow-up:   Ms.Ermal Maree [facility]rieudaz [facility].  At the hospital follow up visit please address:  1.  Resolution of pneumonia and oxygen requirement       Consider PFTs due to history of smoking        Dysphagia, epigastric pain, and GERD (consider protonix instead of prevacid)       Etiology of chronic LE edema         Fibromyalgia and pain medications       Resolution of constipation with medications (miralax, docusate)        Anemia of chronic disease - etiology    2.  Labs / imaging needed at time of follow-up: CBC  (WbC, H/H), FOBT, chest imaging in 4-6 weeks, consider H pylori stool Ag  3.  Pending labs/ test needing follow-up: none  Follow-up Appointments:     Follow-up Information   Follow up with Christen BameSadek, Nora, MD On 10/15/2013. (@330pm )    Specialty:  Internal Medicine   Contact information:   [address] [place] [address] [phone]       Follow up with Richarda OverlieABROL,NAYANA, MD On 10/30/2013. (10am)    Specialty:  Internal Medicine   Contact information:   [address] [place]  [address] [phone]       Discharge Instructions: Discharge Orders   Future Appointments Provider Department Dept Phone   10/15/2013 3:30 PM Christen Bame, MD [facility] [phone]   10/30/2013 10:00 AM [facility] Covering Provider [facility] [phone]   Future Orders Complete By Expires   Call MD for:  difficulty breathing, headache or visual disturbances  As directed    Call MD for:  temperature >100.4  As directed    Diet - low sodium heart healthy  As directed    Increase activity slowly  As directed       Consultations: none  Procedures Performed:  Dg Neck Soft Tissue  10/08/2013   CLINICAL DATA:  Dysphagia.  EXAM: NECK SOFT TISSUES - 1+ VIEW  COMPARISON:  None.  FINDINGS: There is no evidence of retropharyngeal soft tissue swelling or epiglottic enlargement. The cervical airway is unremarkable and no radio-opaque foreign body identified.  Tongue base is normal. Minimal anterior osteophytes in the lower cervical spine. No disc space narrowing.  IMPRESSION: No significant abnormality.   Electronically Signed   By: Geanie Cooley M.D.   On: 10/08/2013 17:40   Dg Chest 2 View  10/05/2013   CLINICAL DATA:  History of chest pain for 48 hr. And arthritis and fibroma myalgia  EXAM: CHEST  2 VIEW  COMPARISON:  None.  FINDINGS: The lungs are well-expanded. There are fluffy increased lung markings bilaterally but most conspicuously in the right upper lobe. The cardiopericardial silhouette is normal in size. The pulmonary vascularity is not engorged. There is no pleural effusion or pneumothorax. The mediastinum is normal in width. The observed portions of the bony thorax exhibit no acute abnormalities.  IMPRESSION: Increased interstitial and alveolar densities bilaterally but predominantly on the right are consistent with pneumonia. Certainly other alveolar filling process ease could be a present. Follow-up chest films or chest CT scanning following therapy will be needed to assure clearing.   Electronically Signed   By: David  [place]   On: 10/05/2013 16:33    2D Echo: 10/06/13  Study Conclusions:  - Left ventricle: The cavity size was normal. Systolic function was normal. The estimated ejection fraction was in the range of 60% to 65%. Wall motion was normal; there were no regional wall motion abnormalities. - Atrial septum: No defect or patent foramen ovale was identified. - Pulmonary arteries: PA peak pressure: 41mm Hg (S).  Cardiac Cath: none  Admission HPI: Original Author Angelina Sheriff, MD  [name] [name] is a 53 y.o. woman with a pmhx of chronic pain(fibromyalgia) who presented to the ED with a cc of body pain and weakness. The patient was in her normal state of health until last week when she developed body aches. As the patient has chronic pain and has been diagnosed with fibromyalgia, she has experience with body aches. However, these  body aches were much different than previous chronic pain. She states that the pain was all over her body. She admits to associated symptoms of fevers and chills for the last 4 days. She states that she developed a productive cough 3 days ago that has been continuous since that time. Over the last two days she has had increasing weakness and fatigue. Since then she admits to eating less due to anorexia. She is tolerating food and water with no nausea, vomitting, or diarrhea.   Of note, she admits to multiple weeks of bilateral LE swelling.  Hospital Course by problem list:  Community Acquired Pneumonia complicated by sepsis and hypoxia - Pt presented with fever, chills, productive cough, body aches, and weakness of 3 day duration and found to be febrile, tachypneic, tachycardic, hypoxic, leukocytosis (29K) with neutrophilia meeting sepsis criteria. Chest xray on 1/18 with increased interstitial and alveolar densities bilaterally, predominantly on the right consistent with pneumonia. Pt was found to be influenza (received 1 dose of tamiflu on admission), HIV, legionella, and strep pneuomonaie negative. Blood cultures were negative for growth. Pt remained afebrile during hosptialization. Pt received oxygen therapy (2.5 - 10L via [place], venturi mask) with SpO2 of 85-100% during hospitalization. Pt received IV ceftriaxone and doxycyline in ED, IV vancomycin (1 day), and levaquin (5 days). Pt instructed to take 2 additional days of levofloxacin 750 mg daily starting on 1/24 for total of 7 day course.  Pt received frequent breathing treatments during hospitalization. Pt with SpO2 during ambulation of 91% at time of discharge. Pt clinically improved during hospitalization with improvement of symptoms. Pt to have follow-up chest imaging in 4-6 weeks to ensure resolution of pneumonia and exclude underlying malignancy. Pt also should have outpatient pulmonary function testing due to history of smoking.   Dysphagia - Pt  with 1 year history of dysphagia to solids with feeling of food stuck in throat. Etiology possibly esophageal obstruction (stricture from chronic GERD, ring, web, malignancy) vs motility (DES, aclasia, scerloderma)  Pt also with weight loss and anemia concerning for alarm symptoms of GERD. Xray of neck on 1/21 was normal. Speech therapy suspected primary esophageal dysphagia. Pt received dysphagia 3 diet during hospitalization. Pt advised to alternate warm liquids and solids and stay upright during and after meals. Pt to follow-up outpatient for further work-up including possible barium swallow, manometry, and EGD.  Normocytic Anemia - Pt with Hg of admission on 10.9 with unknown baseline with stable Hg (9.6-10.9) during hospitalization. Etiology consistent with ACD with vit B12 (wnl), folate (2.6 L), iron (14 L), ferritin ( 533 H), TIBC (137 L), reticulocyte (wnl). Pt with no active bleeding or hemodynamic instability requiring blood transfusion.   Seizure Disorder -  Pt was placed on seizure precautions and did not have seizure activity during hospitalization. Pt was continued on home oxcarbazepine.   Fibromyalgia and Chronic Pain Syndrome - Pt with body aches and pain that was reported to be different form chronic pain most likely due to CAP vs influenza virus. UDS was negative. Pt was continued on home fentanyl patch  Q 3 days, rizatriptan as needed, tramadol as needed, pregabalin, etodolac as needed in setting of epigastric pain and possible gastritis. Physical therapy consult recommended  home health PT and rolling walker. Pt instructed to take pregabalin 300 mg BID instead of TID and tramadol 50 mg BID instead of Q 6hr as needed.    Epigastric Pain in setting of GERD - Etiology unknown, most likely due to gastritis (chronic etodolac use) vs PUD vs GERD. Pt on prevacid BID at home. Lipase was within normal limits. Pt was continued on protonix 40 mg daily during hospitalization with improvement of acid  reflux symptoms. Pt to have close hospital follow-up with consideration of H. Pylori stool testing and EGD if warranted.        Leukocytosis - Pt with WBC of 28K on admission that resolved during hospitalization. Etiology most likely due to CAP. Blood and urine cultures did not reveal growth.    Hypokalemia -  Pt with potassium on 1/20 of 3.1. Mg was within normal limits. Etiology most likely  due to decreased PO intake. Pt received potassium supplementation with normalization during hospitalization.    Constipation - Pt with constipation during hospitalization with normal passage of gas. Pt received miralax and coalce as needed during hospitalization. Pt prescrived miralax and docusate as needed for constipation on discharge.  Lower extremity edema - Pt with reported 1 month history of b/l LE edema. Pro-BNP was elevated at 465.7. 2D- echo revealed normal systolic (EF 60% to 65%) and diastolic function. Renal function was normal and there was no concern for nephrotic syndrome.    Prolonged QT - Pt with QTc of 512 on admission with repeat QTc of 429 on 1/19. QT prolonging medications were held during hospitalization. TSH was within normal limits.   DVT Prophylaxis - Pt was continued on SQ heparin during hospitalization with no evidence of thrombosis or HIT syndrome.    Discharge Vitals:   BP 90/58  Pulse 66  Temp(Src) 97.8 F (36.6 C) (Oral)  Resp 20  Ht 5\' 1"  (1.549 m)  Wt 60.601 kg (133 lb 9.6 oz)  BMI 25.26 kg/m2  SpO2 98%  Discharge Labs:  No results found for this or any previous visit (from the past 24 hour(s)).  Signed: Otis Brace, MD 10/12/2013, 1:58 PM   Time Spent on Discharge: 50  minutes Services Ordered on Discharge: home health PT Equipment Ordered on Discharge: rolling walker

## 2013-10-14 ENCOUNTER — Encounter (HOSPITAL_COMMUNITY): Payer: Self-pay | Admitting: Emergency Medicine

## 2013-10-14 ENCOUNTER — Inpatient Hospital Stay (HOSPITAL_COMMUNITY)
Admission: EM | Admit: 2013-10-14 | Discharge: 2013-10-28 | DRG: 871 | Disposition: A | Discharge status: Home / Self Care | Payer: Medicaid Other | Attending: Infectious Disease | Admitting: Infectious Disease

## 2013-10-14 ENCOUNTER — Emergency Department (HOSPITAL_COMMUNITY): Payer: Medicaid Other

## 2013-10-14 DIAGNOSIS — J9600 Acute respiratory failure, unspecified whether with hypoxia or hypercapnia: Secondary | ICD-10-CM | POA: Diagnosis present

## 2013-10-14 DIAGNOSIS — K3189 Other diseases of stomach and duodenum: Secondary | ICD-10-CM | POA: Diagnosis not present

## 2013-10-14 DIAGNOSIS — G8929 Other chronic pain: Secondary | ICD-10-CM

## 2013-10-14 DIAGNOSIS — G40909 Epilepsy, unspecified, not intractable, without status epilepticus: Secondary | ICD-10-CM | POA: Diagnosis present

## 2013-10-14 DIAGNOSIS — I776 Arteritis, unspecified: Secondary | ICD-10-CM

## 2013-10-14 DIAGNOSIS — I7782 Antineutrophilic cytoplasmic antibody (ANCA) vasculitis: Secondary | ICD-10-CM

## 2013-10-14 DIAGNOSIS — Z79899 Other long term (current) drug therapy: Secondary | ICD-10-CM

## 2013-10-14 DIAGNOSIS — G43909 Migraine, unspecified, not intractable, without status migrainosus: Secondary | ICD-10-CM | POA: Diagnosis present

## 2013-10-14 DIAGNOSIS — N200 Calculus of kidney: Secondary | ICD-10-CM | POA: Diagnosis present

## 2013-10-14 DIAGNOSIS — R339 Retention of urine, unspecified: Secondary | ICD-10-CM | POA: Diagnosis not present

## 2013-10-14 DIAGNOSIS — G4700 Insomnia, unspecified: Secondary | ICD-10-CM | POA: Diagnosis not present

## 2013-10-14 DIAGNOSIS — R0902 Hypoxemia: Secondary | ICD-10-CM

## 2013-10-14 DIAGNOSIS — I498 Other specified cardiac arrhythmias: Secondary | ICD-10-CM | POA: Diagnosis not present

## 2013-10-14 DIAGNOSIS — R259 Unspecified abnormal involuntary movements: Secondary | ICD-10-CM | POA: Diagnosis not present

## 2013-10-14 DIAGNOSIS — E876 Hypokalemia: Secondary | ICD-10-CM | POA: Diagnosis present

## 2013-10-14 DIAGNOSIS — G929 Unspecified toxic encephalopathy: Secondary | ICD-10-CM | POA: Diagnosis not present

## 2013-10-14 DIAGNOSIS — D72829 Elevated white blood cell count, unspecified: Secondary | ICD-10-CM

## 2013-10-14 DIAGNOSIS — Z91013 Allergy to seafood: Secondary | ICD-10-CM

## 2013-10-14 DIAGNOSIS — R599 Enlarged lymph nodes, unspecified: Secondary | ICD-10-CM | POA: Diagnosis present

## 2013-10-14 DIAGNOSIS — G894 Chronic pain syndrome: Secondary | ICD-10-CM | POA: Diagnosis present

## 2013-10-14 DIAGNOSIS — Z87442 Personal history of urinary calculi: Secondary | ICD-10-CM

## 2013-10-14 DIAGNOSIS — H02409 Unspecified ptosis of unspecified eyelid: Secondary | ICD-10-CM | POA: Diagnosis not present

## 2013-10-14 DIAGNOSIS — R110 Nausea: Secondary | ICD-10-CM | POA: Diagnosis present

## 2013-10-14 DIAGNOSIS — R319 Hematuria, unspecified: Secondary | ICD-10-CM | POA: Diagnosis present

## 2013-10-14 DIAGNOSIS — K219 Gastro-esophageal reflux disease without esophagitis: Secondary | ICD-10-CM | POA: Diagnosis present

## 2013-10-14 DIAGNOSIS — IMO0001 Reserved for inherently not codable concepts without codable children: Secondary | ICD-10-CM | POA: Diagnosis present

## 2013-10-14 DIAGNOSIS — R5381 Other malaise: Secondary | ICD-10-CM | POA: Diagnosis present

## 2013-10-14 DIAGNOSIS — M359 Systemic involvement of connective tissue, unspecified: Secondary | ICD-10-CM

## 2013-10-14 DIAGNOSIS — R918 Other nonspecific abnormal finding of lung field: Secondary | ICD-10-CM

## 2013-10-14 DIAGNOSIS — E872 Acidosis, unspecified: Secondary | ICD-10-CM | POA: Diagnosis present

## 2013-10-14 DIAGNOSIS — R4182 Altered mental status, unspecified: Secondary | ICD-10-CM

## 2013-10-14 DIAGNOSIS — IMO0002 Reserved for concepts with insufficient information to code with codable children: Secondary | ICD-10-CM | POA: Diagnosis present

## 2013-10-14 DIAGNOSIS — B009 Herpesviral infection, unspecified: Secondary | ICD-10-CM | POA: Diagnosis present

## 2013-10-14 DIAGNOSIS — J029 Acute pharyngitis, unspecified: Secondary | ICD-10-CM

## 2013-10-14 DIAGNOSIS — E8729 Other acidosis: Secondary | ICD-10-CM

## 2013-10-14 DIAGNOSIS — A419 Sepsis, unspecified organism: Principal | ICD-10-CM | POA: Diagnosis present

## 2013-10-14 DIAGNOSIS — J189 Pneumonia, unspecified organism: Secondary | ICD-10-CM | POA: Diagnosis present

## 2013-10-14 DIAGNOSIS — J9601 Acute respiratory failure with hypoxia: Secondary | ICD-10-CM

## 2013-10-14 DIAGNOSIS — M797 Fibromyalgia: Secondary | ICD-10-CM

## 2013-10-14 DIAGNOSIS — R1013 Epigastric pain: Secondary | ICD-10-CM

## 2013-10-14 DIAGNOSIS — E870 Hyperosmolality and hypernatremia: Secondary | ICD-10-CM | POA: Diagnosis not present

## 2013-10-14 DIAGNOSIS — G92 Toxic encephalopathy: Secondary | ICD-10-CM | POA: Diagnosis not present

## 2013-10-14 DIAGNOSIS — D638 Anemia in other chronic diseases classified elsewhere: Secondary | ICD-10-CM | POA: Diagnosis present

## 2013-10-14 DIAGNOSIS — R6520 Severe sepsis without septic shock: Secondary | ICD-10-CM

## 2013-10-14 DIAGNOSIS — R6521 Severe sepsis with septic shock: Secondary | ICD-10-CM

## 2013-10-14 HISTORY — DX: Pneumonia, unspecified organism: J18.9

## 2013-10-14 LAB — POCT I-STAT 3, ART BLOOD GAS (G3+)
Acid-base deficit: 2 mmol/L (ref 0.0–2.0)
Bicarbonate: 26.1 mEq/L — ABNORMAL HIGH (ref 20.0–24.0)
O2 SAT: 99 %
PO2 ART: 196 mmHg — AB (ref 80.0–100.0)
TCO2: 28 mmol/L (ref 0–100)
pCO2 arterial: 62.5 mmHg (ref 35.0–45.0)
pH, Arterial: 7.239 — ABNORMAL LOW (ref 7.350–7.450)

## 2013-10-14 LAB — COMPREHENSIVE METABOLIC PANEL
ALK PHOS: 160 U/L — AB (ref 39–117)
ALT: 24 U/L (ref 0–35)
AST: 33 U/L (ref 0–37)
Albumin: 2.2 g/dL — ABNORMAL LOW (ref 3.5–5.2)
BILIRUBIN TOTAL: 0.5 mg/dL (ref 0.3–1.2)
BUN: 9 mg/dL (ref 6–23)
CHLORIDE: 104 meq/L (ref 96–112)
CO2: 23 mEq/L (ref 19–32)
Calcium: 8.3 mg/dL — ABNORMAL LOW (ref 8.4–10.5)
Creatinine, Ser: 0.74 mg/dL (ref 0.50–1.10)
GFR calc Af Amer: >90 mL/min (ref >90)
GFR calc non Af Amer: >90 mL/min (ref >90)
GLUCOSE: 117 mg/dL — AB (ref 70–99)
POTASSIUM: 3.5 meq/L — AB (ref 3.7–5.3)
SODIUM: 143 meq/L (ref 137–147)
TOTAL PROTEIN: 6.9 g/dL (ref 6.0–8.3)

## 2013-10-14 LAB — URINALYSIS, ROUTINE W REFLEX MICROSCOPIC
Glucose, UA: NEGATIVE mg/dL
KETONES UR: NEGATIVE mg/dL
LEUKOCYTES UA: NEGATIVE
Nitrite: NEGATIVE
PH: 6 (ref 5.0–8.0)
PROTEIN: NEGATIVE mg/dL
Specific Gravity, Urine: 1.022 (ref 1.005–1.030)
Urobilinogen, UA: 0.2 mg/dL (ref 0.0–1.0)

## 2013-10-14 LAB — CBC WITH DIFFERENTIAL/PLATELET
BASOS PCT: 0 % (ref 0–1)
Basophils Absolute: 0 10*3/uL (ref 0.0–0.1)
Eosinophils Absolute: 0 10*3/uL (ref 0.0–0.7)
Eosinophils Relative: 0 % (ref 0–5)
HEMATOCRIT: 33 % — AB (ref 36.0–46.0)
Hemoglobin: 10.8 g/dL — ABNORMAL LOW (ref 12.0–15.0)
LYMPHS PCT: 1 % — AB (ref 12–46)
Lymphs Abs: 0.4 10*3/uL — ABNORMAL LOW (ref 0.7–4.0)
MCH: 28.7 pg (ref 26.0–34.0)
MCHC: 32.7 g/dL (ref 30.0–36.0)
MCV: 87.8 fL (ref 78.0–100.0)
Monocytes Absolute: 2.1 10*3/uL — ABNORMAL HIGH (ref 0.1–1.0)
Monocytes Relative: 5 % (ref 3–12)
Neutro Abs: 38.8 10*3/uL — ABNORMAL HIGH (ref 1.7–7.7)
Neutrophils Relative %: 94 % — ABNORMAL HIGH (ref 43–77)
Platelets: 427 10*3/uL — ABNORMAL HIGH (ref 150–400)
RBC: 3.76 MIL/uL — ABNORMAL LOW (ref 3.87–5.11)
RDW: 15.5 % (ref 11.5–15.5)
WBC: 41.3 10*3/uL — ABNORMAL HIGH (ref 4.0–10.5)

## 2013-10-14 LAB — URINE MICROSCOPIC-ADD ON

## 2013-10-14 LAB — CG4 I-STAT (LACTIC ACID): Lactic Acid, Venous: 2.97 mmol/L — ABNORMAL HIGH (ref 0.5–2.2)

## 2013-10-14 LAB — POCT I-STAT TROPONIN I: Troponin i, poc: 0 ng/mL (ref 0.00–0.08)

## 2013-10-14 MED ORDER — SODIUM CHLORIDE 0.9 % IV SOLN
1000.0000 mL | Freq: Once | INTRAVENOUS | Status: AC
  Administered 2013-10-14: 1000 mL via INTRAVENOUS

## 2013-10-14 MED ORDER — VANCOMYCIN HCL IN DEXTROSE 750-5 MG/150ML-% IV SOLN
750.0000 mg | Freq: Two times a day (BID) | INTRAVENOUS | Status: DC
Start: 2013-10-15 — End: 2013-10-17
  Administered 2013-10-15 – 2013-10-17 (×5): 750 mg via INTRAVENOUS
  Filled 2013-10-14 (×7): qty 150

## 2013-10-14 MED ORDER — VANCOMYCIN HCL IN DEXTROSE 1-5 GM/200ML-% IV SOLN
1000.0000 mg | Freq: Once | INTRAVENOUS | Status: AC
  Administered 2013-10-14: 1000 mg via INTRAVENOUS
  Filled 2013-10-14: qty 200

## 2013-10-14 MED ORDER — SODIUM CHLORIDE 0.9 % IV SOLN
1000.0000 mL | INTRAVENOUS | Status: DC
  Administered 2013-10-14: 1000 mL via INTRAVENOUS

## 2013-10-14 MED ORDER — PIPERACILLIN-TAZOBACTAM 3.375 G IVPB
3.3750 g | Freq: Three times a day (TID) | INTRAVENOUS | Status: DC
  Administered 2013-10-14 – 2013-10-17 (×8): 3.375 g via INTRAVENOUS
  Filled 2013-10-14 (×10): qty 50

## 2013-10-14 MED ORDER — OSELTAMIVIR PHOSPHATE 75 MG PO CAPS
75.0000 mg | ORAL_CAPSULE | Freq: Once | ORAL | Status: AC
  Administered 2013-10-14: 75 mg via ORAL
  Filled 2013-10-14: qty 1

## 2013-10-14 MED ORDER — DEXTROSE 5 % IV SOLN
500.0000 mg | INTRAVENOUS | Status: DC
  Administered 2013-10-15 – 2013-10-16 (×2): 500 mg via INTRAVENOUS
  Filled 2013-10-14: qty 500

## 2013-10-14 MED ORDER — ACETAMINOPHEN 325 MG PO TABS: 650.0000 mg | ORAL_TABLET | Freq: Once | ORAL | Status: DC

## 2013-10-14 NOTE — Consult Note (Addendum)
ANTIBIOTIC CONSULT NOTE - INITIAL  Pharmacy Consult for Vancomycin and Zosyn Indication: sepsis  Allergies  Allergen Reactions  . Shellfish Allergy Anaphylaxis    Patient Measurements: Height: 5' 1.02" (155 cm) Weight: 133 lb 9.6 oz (60.6 kg) IBW/kg (Calculated) : 47.86  Vital Signs: Temp: 103 F (39.4 C) (01/27 1801) Temp src: Oral (01/27 1801) BP: 94/56 mmHg (01/27 1801) Pulse Rate: 142 (01/27 1801) Intake/Output from previous day:   Intake/Output from this shift:    Labs: Estimated Creatinine Clearance: 69.6 ml/min (by C-G formula based on Cr of 0.79).   Microbiology: Recent Results (from the past 720 hour(s))  CULTURE, BLOOD (ROUTINE X 2)     Status: None   Collection Time    10/05/13  5:49 PM      Result Value Range Status   Specimen Description BLOOD RIGHT ARM   Final   Special Requests BOTTLES DRAWN AEROBIC AND ANAEROBIC [facility] EACH   Final   Culture  Setup Time     Final   Value: 10/06/2013 08:56     Performed at [facility]   Culture     Final   Value: NO GROWTH 5 DAYS     Performed at [facility]   Report Status 10/12/2013 FINAL   Final  MRSA PCR SCREENING     Status: None   Collection Time    10/05/13  7:36 PM      Result Value Range Status   MRSA by PCR NEGATIVE  NEGATIVE Final   Comment:            The GeneXpert MRSA Assay (FDA     approved for NASAL specimens     only), is one component of a     comprehensive MRSA colonization     surveillance program. It is not     intended to diagnose MRSA     infection nor to guide or     monitor treatment for     MRSA infections.  CULTURE, BLOOD (ROUTINE X 2)     Status: None   Collection Time    10/05/13  9:08 PM      Result Value Range Status   Specimen Description BLOOD LEFT ANTECUBITAL   Final   Special Requests BOTTLES DRAWN AEROBIC AND ANAEROBIC 10CC EA   Final   Culture  Setup Time     Final   Value: 10/06/2013 08:56     Performed at [facility]   Culture     Final    Value: NO GROWTH 5 DAYS     Performed at [facility]   Report Status 10/12/2013 FINAL   Final  URINE CULTURE     Status: None   Collection Time    10/07/13 12:18 AM      Result Value Range Status   Specimen Description URINE, RANDOM   Final   Special Requests NONE   Final   Culture  Setup Time     Final   Value: 10/07/2013 00:35     Performed at [facility]   Colony Count     Final   Value: NO GROWTH     Performed at [facility]   Culture     Final   Value: NO GROWTH     Performed at [facility]   Report Status 10/08/2013 FINAL   Final    Medical History: Past Medical History  Diagnosis Date  . Fibromyalgia   . Chronic  pain   . Seizures   . Migraine   . Pneumonia    Assessment: 52yof with recent admission for CAP, discharged 10/12/13 on po levaquin, returns to the ED today with fever and SOB. CXR shows improving right and slightly increasing left airspace disease. She will begin empiric antibiotics for probable sepsis. Labs for this admission are pending.  Goal of Therapy:  Vancomycin trough level 15-20 mcg/ml  Plan:  1) Vancomycin 1000mg  IV x 1 2) Zosyn 3.375g IV q8 3) Follow up labs for vancomycin maintenance dosing  Fredrik Rigger 10/14/2013,6:48 PM  BMET has resulted. Renal function is stable with sCr 0.74 and CrCl ~45ml/min.  Plan: 1) Schedule vancomycin 750mg  IV q12  Fredrik Rigger 10/14/2013, 8:32 PM

## 2013-10-14 NOTE — ED Notes (Signed)
Pt. Has c/o getting up today and having a syncopal episode. Family reports doing "mouth to mouth."  Pt. Reports recent discharge from hospital for Pneumonia. Pt. reports that fever came on suddenly. Pt. Is noted pale, hot, SOB, and shaking all over.  Family is present at the bedside.

## 2013-10-14 NOTE — ED Provider Notes (Signed)
  Face-to-face evaluation   History: She's been about a week. She is worsened since she went home. She presents today complaining of generalized myalgia, fever, sneezing, coughing, and decreased appetite. \   Physical exam: Alert, calm, sneezing persistently. She is not in respiratory distress. She is somewhat tachypneic and tachycardic.  Medical screening examination/treatment/procedure(s) were conducted as a shared visit with non-physician practitioner(s) and myself.  I personally evaluated the patient during the encounter  Flint MelterElliott L Maximo Spratling, MD 10/16/13 [phone]

## 2013-10-14 NOTE — ED Notes (Signed)
Notified Phlebotomy of need for lab draw due to multiple sticks.

## 2013-10-14 NOTE — ED Provider Notes (Signed)
CSN: [id number]     Arrival date & time 10/14/13  1754 History   First MD Initiated Contact with Patient 10/14/13 1755     Chief Complaint  Patient presents with  . Loss of Consciousness  . Code Sepsis   (Consider location/radiation/quality/duration/timing/severity/associated sxs/prior Treatment) The history is provided by the patient and medical records.   This is a 53 year old female with past medical history significant for fibromyalgia, seizures, migraines, and presenting to the ED for fever. Patient was hospitalized for 5 days last week for community-acquired pneumonia. She was discharged on Friday but has continued to repeatedbly sneeze, have rhinorrhea, and cough.  She had remained afebrile until today around 1300. Son present with her at bedside notes that ishe felt warm to touch and was somewhat diaphoretic and visibly shivering.  Fever noted to be 102F orally so he called EMS.  Per EMS, pt had a near syncopal event en route.  On arrival, pt is hypotensive, tachycardic, tachypneic, febrile, and hypoxic.  Pt is full code.  Past Medical History  Diagnosis Date  . Fibromyalgia   . Chronic pain   . Seizures   . Migraine    No past surgical history on file. No family history on file. History  Substance Use Topics  . Smoking status: Never Smoker   . Smokeless tobacco: Not on file  . Alcohol Use: No   OB History   Grav Para Term Preterm Abortions TAB SAB Ect Mult Living                 Review of Systems  Constitutional: Positive for fever.  All other systems reviewed and are negative.    Allergies  Shellfish allergy  Home Medications   Current Outpatient Rx  Name  Route  Sig  Dispense  Refill  . docusate sodium 100 MG CAPS   Oral   Take 100 mg by mouth daily as needed for mild constipation.   10 capsule   0   . etodolac (LODINE) 500 MG tablet   Oral   Take 500 mg by mouth 3 (three) times daily.         . fentaNYL (DURAGESIC - DOSED MCG/HR) 50 MCG/HR  Transdermal   Place 1 patch (50 mcg total) onto the skin every 3 (three) days.   4 patch   0   . levofloxacin (LEVAQUIN) 750 MG tablet   Oral   Take 1 tablet (750 mg total) by mouth daily.   1 tablet   0   . Oxcarbazepine (TRILEPTAL) 300 MG tablet   Oral   Take 1 tablet (300 mg total) by mouth 2 (two) times daily.   42 tablet   0   . pantoprazole (PROTONIX) 40 MG tablet   Oral   Take 1 tablet (40 mg total) by mouth daily.   30 tablet   0   . polyethylene glycol (MIRALAX / GLYCOLAX) packet   Oral   Take 17 g by mouth daily as needed for mild constipation or moderate constipation.   14 each   0   . pregabalin (LYRICA) 300 MG capsule   Oral   Take 1 capsule (300 mg total) by mouth 2 (two) times daily.   42 capsule   0   . rizatriptan (MAXALT) 10 MG tablet   Oral   Take 10 mg by mouth daily as needed for migraine. May repeat in 2 hours if needed         . traMADol (ULTRAM) 50  MG tablet   Oral   Take 1 tablet (50 mg total) by mouth every 12 (twelve) hours as needed for moderate pain.   42 tablet   0    Pulse 139  Resp 30  SpO2 91%  Physical Exam  Nursing note and vitals reviewed. Constitutional: She is oriented to person, place, and time. She appears well-developed and well-nourished. No distress.  Pale; shivering  HENT:  Head: Normocephalic and atraumatic.  Right Ear: Tympanic membrane and ear canal normal.  Left Ear: Tympanic membrane and ear canal normal.  Nose: Rhinorrhea present.  Mouth/Throat: Uvula is midline, oropharynx is clear and moist and mucous membranes are normal. No oropharyngeal exudate, posterior oropharyngeal edema, posterior oropharyngeal erythema or tonsillar abscesses.  Sneezing with clear rhinorrhea  Eyes: Conjunctivae and EOM are normal. Pupils are equal, round, and reactive to light.  Neck: Normal range of motion. Neck supple.  Cardiovascular: Regular rhythm and normal heart sounds.  Tachycardia present.   Pulmonary/Chest: Breath  sounds normal. Tachypnea noted. No respiratory distress.  3L via [place]; tachypneic without respiratory distress  Abdominal: Soft. Bowel sounds are normal.  Musculoskeletal: Normal range of motion.  Neurological: She is alert and oriented to person, place, and time.  Skin: Skin is warm and dry. She is not diaphoretic.  Psychiatric: She has a normal mood and affect.    ED Course  Procedures (including critical care time)   Date: 10/14/2013  Rate: 100  Rhythm: sinus tachycardia  QRS Axis: normal  Intervals: normal  ST/T Wave abnormalities: nonspecific T wave changes  Conduction Disutrbances:none  Narrative Interpretation:   Old EKG Reviewed: unchanged   Labs Review Labs Reviewed  CBC WITH DIFFERENTIAL - Abnormal; Notable for the following:    WBC 41.3 (*)    RBC 3.76 (*)    Hemoglobin 10.8 (*)    HCT 33.0 (*)    Platelets 427 (*)    Neutrophils Relative % 94 (*)    Lymphocytes Relative 1 (*)    Neutro Abs 38.8 (*)    Lymphs Abs 0.4 (*)    Monocytes Absolute 2.1 (*)    All other components within normal limits  COMPREHENSIVE METABOLIC PANEL - Abnormal; Notable for the following:    Potassium 3.5 (*)    Glucose, Bld 117 (*)    Calcium 8.3 (*)    Albumin 2.2 (*)    Alkaline Phosphatase 160 (*)    All other components within normal limits  URINALYSIS, ROUTINE W REFLEX MICROSCOPIC - Abnormal; Notable for the following:    APPearance CLOUDY (*)    Hgb urine dipstick LARGE (*)    Bilirubin Urine LARGE (*)    All other components within normal limits  CG4 I-STAT (LACTIC ACID) - Abnormal; Notable for the following:    Lactic Acid, Venous 2.97 (*)    All other components within normal limits  POCT I-STAT 3, BLOOD GAS (G3+) - Abnormal; Notable for the following:    pH, Arterial 7.239 (*)    pCO2 arterial 62.5 (*)    pO2, Arterial 196.0 (*)    Bicarbonate 26.1 (*)    All other components within normal limits  CULTURE, BLOOD (ROUTINE X 2)  CULTURE, BLOOD (ROUTINE X 2)  URINE  CULTURE  URINE MICROSCOPIC-ADD ON  INFLUENZA PANEL BY PCR (TYPE A & B, H1N1)  POCT I-STAT TROPONIN I   Imaging Review Dg Chest Port 1 View  10/14/2013   CLINICAL DATA:  Pneumonia  EXAM: PORTABLE CHEST - 1 VIEW  COMPARISON:  10/05/2013  FINDINGS: Worsening poorly marginated airspace opacities in the left upper lobe. Some improvement in the alveolar and airspace opacities in the right mid and upper lung. Heart size normal. Effacement of the aorticopulmonary window suggesting adenopathy. . No effusion. Visualized skeletal structures are unremarkable.  IMPRESSION: 1. Improving right and slightly increasing left airspace disease. 2. Possible AP window adenopathy.   Electronically Signed   By: Oley Balm M.D.   On: 10/14/2013 18:30   CRITICAL CARE Performed by: Garlon Hatchet   Total critical care time: 30  Critical care time was exclusive of separately billable procedures and treating other patients.  Critical care was necessary to treat or prevent imminent or life-threatening deterioration.  Critical care was time spent personally by me on the following activities: development of treatment plan with patient and/or surrogate as well as nursing, discussions with consultants, evaluation of patient's response to treatment, examination of patient, obtaining history from patient or surrogate, ordering and performing treatments and interventions, ordering and review of laboratory studies, ordering and review of radiographic studies, pulse oximetry and re-evaluation of patient's condition.  Medications  0.9 %  sodium chloride infusion (0 mLs Intravenous Stopped 10/14/13 1923)    Followed by  0.9 %  sodium chloride infusion (0 mLs Intravenous Stopped 10/14/13 2030)    Followed by  0.9 %  sodium chloride infusion (0 mLs Intravenous Stopped 10/14/13 2313)  piperacillin-tazobactam (ZOSYN) IVPB 3.375 g (0 g Intravenous Stopped 10/14/13 2116)  vancomycin (VANCOCIN) IVPB 750 mg/150 ml premix (not  administered)  azithromycin (ZITHROMAX) 500 mg in dextrose 5 % 250 mL IVPB (not administered)  oseltamivir (TAMIFLU) capsule 75 mg (75 mg Oral Given 10/14/13 1851)  vancomycin (VANCOCIN) IVPB 1000 mg/200 mL premix (0 mg Intravenous Stopped 10/14/13 2030)    EKG Interpretation   None       MDM   1. Sepsis   2. CAP (community acquired pneumonia)   3. Respiratory acidosis    Pt febrile, tachycardic, hypotensive, and hypoxic on arrival-- level II code sepsis activated.  EKG sinus tachycardia, no acute ischemic changes. Troponin is negative. Patient given IV fluid bolus with improvement of heart rate and blood pressure. Pt remains tachypneic and hypoxic with O2 via [place]-- placed on non-rebreather by nursing staff with improvement to 100%.  ABG revealing acidosis with compensation.  Labs with significant leukocytosis of 41 (on steroids at present), lactic acid 2.97.  Chest x-ray worsening left airspace disease.  Blood and urine cultures obtained. Patient started on empiric vancomycin and Zosyn. Repeat influenza PCR obtained, tamiflu given, and placed on droplet precautions.  She will be admitted to the hospital for further management.  Consulted IM, has evaluated pt but prefers critical care admission.  PCCM has evaluated pt in the ED and agreed to admit.  Garlon Hatchet, PA-C 10/15/13 [phone]

## 2013-10-15 ENCOUNTER — Encounter (HOSPITAL_COMMUNITY): Payer: Medicaid Other | Admitting: Certified Registered"

## 2013-10-15 ENCOUNTER — Encounter (HOSPITAL_COMMUNITY): Payer: Self-pay | Admitting: Certified Registered"

## 2013-10-15 ENCOUNTER — Ambulatory Visit: Payer: Self-pay | Admitting: Internal Medicine

## 2013-10-15 ENCOUNTER — Inpatient Hospital Stay (HOSPITAL_COMMUNITY): Payer: Medicaid Other | Admitting: Certified Registered"

## 2013-10-15 ENCOUNTER — Inpatient Hospital Stay (HOSPITAL_COMMUNITY): Payer: Medicaid Other

## 2013-10-15 ENCOUNTER — Emergency Department (HOSPITAL_COMMUNITY): Payer: Medicaid Other

## 2013-10-15 ENCOUNTER — Encounter (HOSPITAL_COMMUNITY): Payer: Self-pay | Admitting: Radiology

## 2013-10-15 DIAGNOSIS — B009 Herpesviral infection, unspecified: Secondary | ICD-10-CM | POA: Diagnosis present

## 2013-10-15 DIAGNOSIS — R110 Nausea: Secondary | ICD-10-CM | POA: Diagnosis present

## 2013-10-15 DIAGNOSIS — J029 Acute pharyngitis, unspecified: Secondary | ICD-10-CM | POA: Diagnosis present

## 2013-10-15 DIAGNOSIS — R599 Enlarged lymph nodes, unspecified: Secondary | ICD-10-CM | POA: Diagnosis present

## 2013-10-15 DIAGNOSIS — G43909 Migraine, unspecified, not intractable, without status migrainosus: Secondary | ICD-10-CM | POA: Diagnosis present

## 2013-10-15 DIAGNOSIS — K3189 Other diseases of stomach and duodenum: Secondary | ICD-10-CM | POA: Diagnosis not present

## 2013-10-15 DIAGNOSIS — E872 Acidosis, unspecified: Secondary | ICD-10-CM | POA: Diagnosis present

## 2013-10-15 DIAGNOSIS — N200 Calculus of kidney: Secondary | ICD-10-CM | POA: Diagnosis present

## 2013-10-15 DIAGNOSIS — R319 Hematuria, unspecified: Secondary | ICD-10-CM | POA: Diagnosis present

## 2013-10-15 DIAGNOSIS — K219 Gastro-esophageal reflux disease without esophagitis: Secondary | ICD-10-CM | POA: Diagnosis present

## 2013-10-15 DIAGNOSIS — D638 Anemia in other chronic diseases classified elsewhere: Secondary | ICD-10-CM | POA: Diagnosis present

## 2013-10-15 DIAGNOSIS — R1013 Epigastric pain: Secondary | ICD-10-CM | POA: Diagnosis present

## 2013-10-15 DIAGNOSIS — G894 Chronic pain syndrome: Secondary | ICD-10-CM | POA: Diagnosis present

## 2013-10-15 DIAGNOSIS — G929 Unspecified toxic encephalopathy: Secondary | ICD-10-CM | POA: Diagnosis not present

## 2013-10-15 DIAGNOSIS — I498 Other specified cardiac arrhythmias: Secondary | ICD-10-CM | POA: Diagnosis not present

## 2013-10-15 DIAGNOSIS — J9600 Acute respiratory failure, unspecified whether with hypoxia or hypercapnia: Secondary | ICD-10-CM | POA: Diagnosis present

## 2013-10-15 DIAGNOSIS — IMO0002 Reserved for concepts with insufficient information to code with codable children: Secondary | ICD-10-CM | POA: Diagnosis present

## 2013-10-15 DIAGNOSIS — Z91013 Allergy to seafood: Secondary | ICD-10-CM | POA: Diagnosis not present

## 2013-10-15 DIAGNOSIS — H02409 Unspecified ptosis of unspecified eyelid: Secondary | ICD-10-CM | POA: Diagnosis not present

## 2013-10-15 DIAGNOSIS — R6520 Severe sepsis without septic shock: Secondary | ICD-10-CM

## 2013-10-15 DIAGNOSIS — R339 Retention of urine, unspecified: Secondary | ICD-10-CM | POA: Diagnosis not present

## 2013-10-15 DIAGNOSIS — R509 Fever, unspecified: Secondary | ICD-10-CM | POA: Diagnosis present

## 2013-10-15 DIAGNOSIS — G4700 Insomnia, unspecified: Secondary | ICD-10-CM | POA: Diagnosis not present

## 2013-10-15 DIAGNOSIS — Z79899 Other long term (current) drug therapy: Secondary | ICD-10-CM | POA: Diagnosis not present

## 2013-10-15 DIAGNOSIS — A419 Sepsis, unspecified organism: Secondary | ICD-10-CM | POA: Diagnosis present

## 2013-10-15 DIAGNOSIS — R5381 Other malaise: Secondary | ICD-10-CM | POA: Diagnosis present

## 2013-10-15 DIAGNOSIS — E870 Hyperosmolality and hypernatremia: Secondary | ICD-10-CM | POA: Diagnosis not present

## 2013-10-15 DIAGNOSIS — G92 Toxic encephalopathy: Secondary | ICD-10-CM | POA: Diagnosis not present

## 2013-10-15 DIAGNOSIS — I369 Nonrheumatic tricuspid valve disorder, unspecified: Secondary | ICD-10-CM

## 2013-10-15 DIAGNOSIS — G40909 Epilepsy, unspecified, not intractable, without status epilepticus: Secondary | ICD-10-CM | POA: Diagnosis present

## 2013-10-15 DIAGNOSIS — J189 Pneumonia, unspecified organism: Secondary | ICD-10-CM | POA: Diagnosis present

## 2013-10-15 DIAGNOSIS — Z87442 Personal history of urinary calculi: Secondary | ICD-10-CM | POA: Diagnosis present

## 2013-10-15 DIAGNOSIS — R259 Unspecified abnormal involuntary movements: Secondary | ICD-10-CM | POA: Diagnosis not present

## 2013-10-15 DIAGNOSIS — J9601 Acute respiratory failure with hypoxia: Secondary | ICD-10-CM | POA: Diagnosis present

## 2013-10-15 DIAGNOSIS — IMO0001 Reserved for inherently not codable concepts without codable children: Secondary | ICD-10-CM | POA: Diagnosis present

## 2013-10-15 DIAGNOSIS — E876 Hypokalemia: Secondary | ICD-10-CM | POA: Diagnosis present

## 2013-10-15 DIAGNOSIS — R0902 Hypoxemia: Secondary | ICD-10-CM

## 2013-10-15 DIAGNOSIS — D72829 Elevated white blood cell count, unspecified: Secondary | ICD-10-CM

## 2013-10-15 LAB — POCT I-STAT 3, ART BLOOD GAS (G3+)
Acid-base deficit: 3 mmol/L — ABNORMAL HIGH (ref 0.0–2.0)
Acid-base deficit: 4 mmol/L — ABNORMAL HIGH (ref 0.0–2.0)
Acid-base deficit: 3 mmol/L — ABNORMAL HIGH (ref 0.0–2.0)
BICARBONATE: 25.9 meq/L — AB (ref 20.0–24.0)
Bicarbonate: 23 mEq/L (ref 20.0–24.0)
Bicarbonate: 22.5 mEq/L (ref 20.0–24.0)
O2 SAT: 100 %
O2 Saturation: 96 %
O2 Saturation: 94 %
PH ART: 7.196 — AB (ref 7.350–7.450)
PH ART: 7.257 — AB (ref 7.350–7.450)
PH ART: 7.335 — AB (ref 7.350–7.450)
PO2 ART: 312 mmHg — AB (ref 80.0–100.0)
Patient temperature: 99.3
Patient temperature: 97.8
TCO2: 28 mmol/L (ref 0–100)
TCO2: 25 mmol/L (ref 0–100)
TCO2: 24 mmol/L (ref 0–100)
pCO2 arterial: 67.1 mmHg (ref 35.0–45.0)
pCO2 arterial: 51.2 mmHg — ABNORMAL HIGH (ref 35.0–45.0)
pCO2 arterial: 42 mmHg (ref 35.0–45.0)
pO2, Arterial: 92 mmHg (ref 80.0–100.0)
pO2, Arterial: 76 mmHg — ABNORMAL LOW (ref 80.0–100.0)

## 2013-10-15 LAB — TROPONIN I: Troponin I: 1.02 ng/mL (ref <0.30)

## 2013-10-15 LAB — URINALYSIS, ROUTINE W REFLEX MICROSCOPIC
GLUCOSE, UA: NEGATIVE mg/dL
Ketones, ur: NEGATIVE mg/dL
LEUKOCYTES UA: NEGATIVE
NITRITE: NEGATIVE
PH: 6 (ref 5.0–8.0)
Protein, ur: NEGATIVE mg/dL
Specific Gravity, Urine: 1.028 (ref 1.005–1.030)
Urobilinogen, UA: 0.2 mg/dL (ref 0.0–1.0)

## 2013-10-15 LAB — BASIC METABOLIC PANEL
BUN: 6 mg/dL (ref 6–23)
CALCIUM: 7.7 mg/dL — AB (ref 8.4–10.5)
CO2: 23 mEq/L (ref 19–32)
CREATININE: 0.62 mg/dL (ref 0.50–1.10)
Chloride: 111 mEq/L (ref 96–112)
GFR calc Af Amer: >90 mL/min (ref >90)
GLUCOSE: 118 mg/dL — AB (ref 70–99)
Potassium: 3.7 mEq/L (ref 3.7–5.3)
SODIUM: 145 meq/L (ref 137–147)

## 2013-10-15 LAB — CBC
HCT: 33.5 % — ABNORMAL LOW (ref 36.0–46.0)
HCT: 30.2 % — ABNORMAL LOW (ref 36.0–46.0)
HEMOGLOBIN: 10.7 g/dL — AB (ref 12.0–15.0)
Hemoglobin: 9.6 g/dL — ABNORMAL LOW (ref 12.0–15.0)
MCH: 28.5 pg (ref 26.0–34.0)
MCH: 28.5 pg (ref 26.0–34.0)
MCHC: 31.9 g/dL (ref 30.0–36.0)
MCHC: 31.8 g/dL (ref 30.0–36.0)
MCV: 89.1 fL (ref 78.0–100.0)
MCV: 89.6 fL (ref 78.0–100.0)
Platelets: 422 10*3/uL — ABNORMAL HIGH (ref 150–400)
Platelets: 391 10*3/uL (ref 150–400)
RBC: 3.76 MIL/uL — ABNORMAL LOW (ref 3.87–5.11)
RBC: 3.37 MIL/uL — ABNORMAL LOW (ref 3.87–5.11)
RDW: 15.7 % — AB (ref 11.5–15.5)
RDW: 15.8 % — ABNORMAL HIGH (ref 11.5–15.5)
WBC: 49.1 10*3/uL — AB (ref 4.0–10.5)
WBC: 46.6 10*3/uL — ABNORMAL HIGH (ref 4.0–10.5)

## 2013-10-15 LAB — BODY FLUID CELL COUNT WITH DIFFERENTIAL
EOS FL: 0 %
Lymphs, Fluid: 3 %
Monocyte-Macrophage-Serous Fluid: 4 % — ABNORMAL LOW (ref 50–90)
Neutrophil Count, Fluid: 93 % — ABNORMAL HIGH (ref 0–25)
Other Cells, Fluid: 0 %
Total Nucleated Cell Count, Fluid: 6688 cu mm — ABNORMAL HIGH (ref 0–1000)

## 2013-10-15 LAB — RHEUMATOID FACTOR: Rhuematoid fact SerPl-aCnc: 13 IU/mL (ref <=14)

## 2013-10-15 LAB — URINE CULTURE
CULTURE: NO GROWTH
Colony Count: NO GROWTH

## 2013-10-15 LAB — MRSA PCR SCREENING: MRSA by PCR: NEGATIVE

## 2013-10-15 LAB — PROCALCITONIN: Procalcitonin: 2.19 ng/mL

## 2013-10-15 LAB — URINE MICROSCOPIC-ADD ON

## 2013-10-15 LAB — INFLUENZA PANEL BY PCR (TYPE A & B)
H1N1FLUPCR: NOT DETECTED
INFLAPCR: NEGATIVE
Influenza B By PCR: NEGATIVE

## 2013-10-15 LAB — AMYLASE: AMYLASE: 16 U/L (ref 0–105)

## 2013-10-15 LAB — HIV ANTIBODY (ROUTINE TESTING W REFLEX): HIV: NONREACTIVE

## 2013-10-15 LAB — RAPID STREP SCREEN (MED CTR MEBANE ONLY): STREPTOCOCCUS, GROUP A SCREEN (DIRECT): NEGATIVE

## 2013-10-15 LAB — GLUCOSE, CAPILLARY
GLUCOSE-CAPILLARY: 106 mg/dL — AB (ref 70–99)
GLUCOSE-CAPILLARY: 73 mg/dL (ref 70–99)
Glucose-Capillary: 91 mg/dL (ref 70–99)
Glucose-Capillary: 75 mg/dL (ref 70–99)

## 2013-10-15 LAB — C-REACTIVE PROTEIN: CRP: 23.8 mg/dL — ABNORMAL HIGH (ref <0.60)

## 2013-10-15 LAB — CORTISOL: Cortisol, Plasma: 11.1 ug/dL

## 2013-10-15 LAB — PNEUMOCYSTIS JIROVECI SMEAR BY DFA: Pneumocystis jiroveci Ag: NEGATIVE

## 2013-10-15 LAB — SEDIMENTATION RATE: Sed Rate: 98 mm/hr — ABNORMAL HIGH (ref 0–22)

## 2013-10-15 LAB — TSH: TSH: 0.226 u[IU]/mL — ABNORMAL LOW (ref 0.350–4.500)

## 2013-10-15 MED ORDER — FENTANYL BOLUS VIA INFUSION
50.0000 ug | INTRAVENOUS | Status: DC | PRN
  Filled 2013-10-15: qty 100

## 2013-10-15 MED ORDER — MIDAZOLAM HCL 2 MG/2ML IJ SOLN
2.0000 mg | INTRAMUSCULAR | Status: DC | PRN
  Administered 2013-10-18: 2 mg via INTRAVENOUS
  Filled 2013-10-15: qty 2

## 2013-10-15 MED ORDER — ENOXAPARIN SODIUM 40 MG/0.4ML ~~LOC~~ SOLN
40.0000 mg | SUBCUTANEOUS | Status: DC
  Administered 2013-10-15 – 2013-10-23 (×9): 40 mg via SUBCUTANEOUS
  Filled 2013-10-15 (×9): qty 0.4

## 2013-10-15 MED ORDER — FUROSEMIDE 10 MG/ML IJ SOLN: 40.0000 mg | Freq: Once | INTRAMUSCULAR | Status: DC

## 2013-10-15 MED ORDER — ASPIRIN 81 MG PO CHEW: 324.0000 mg | CHEWABLE_TABLET | ORAL | Status: AC

## 2013-10-15 MED ORDER — SODIUM CHLORIDE 0.9 % IV BOLUS (SEPSIS): 1000.0000 mL | Freq: Once | INTRAVENOUS | Status: DC

## 2013-10-15 MED ORDER — MORPHINE SULFATE 2 MG/ML IJ SOLN: 2.0000 mg | Freq: Once | INTRAMUSCULAR | Status: AC

## 2013-10-15 MED ORDER — OSELTAMIVIR PHOSPHATE 6 MG/ML PO SUSR
75.0000 mg | Freq: Two times a day (BID) | ORAL | Status: DC
  Administered 2013-10-15 – 2013-10-17 (×6): 75 mg via ORAL
  Filled 2013-10-15 (×9): qty 12.5

## 2013-10-15 MED ORDER — BIOTENE DRY MOUTH MT LIQD
15.0000 mL | Freq: Four times a day (QID) | OROMUCOSAL | Status: DC
  Administered 2013-10-15 – 2013-10-22 (×27): 15 mL via OROMUCOSAL

## 2013-10-15 MED ORDER — SODIUM CHLORIDE 0.9 % IV BOLUS (SEPSIS)
1000.0000 mL | Freq: Once | INTRAVENOUS | Status: AC
  Administered 2013-10-15: 1000 mL via INTRAVENOUS

## 2013-10-15 MED ORDER — OXCARBAZEPINE 300 MG PO TABS
300.0000 mg | ORAL_TABLET | Freq: Two times a day (BID) | ORAL | Status: DC
  Administered 2013-10-15 – 2013-10-19 (×8): 300 mg
  Filled 2013-10-15 (×12): qty 1

## 2013-10-15 MED ORDER — SODIUM CHLORIDE 0.9 % IV BOLUS (SEPSIS): 1000.0000 mL | INTRAVENOUS | Status: DC | PRN

## 2013-10-15 MED ORDER — MIDAZOLAM HCL 2 MG/2ML IJ SOLN
INTRAMUSCULAR | Status: AC
  Filled 2013-10-15: qty 6

## 2013-10-15 MED ORDER — MIDAZOLAM HCL 2 MG/2ML IJ SOLN
2.0000 mg | INTRAMUSCULAR | Status: DC | PRN
  Filled 2013-10-15: qty 2

## 2013-10-15 MED ORDER — ETOMIDATE 2 MG/ML IV SOLN
INTRAVENOUS | Status: AC
  Administered 2013-10-15: 7.5 mg
  Filled 2013-10-15: qty 10

## 2013-10-15 MED ORDER — OXCARBAZEPINE 300 MG PO TABS
300.0000 mg | ORAL_TABLET | Freq: Two times a day (BID) | ORAL | Status: DC
  Administered 2013-10-15: 300 mg via ORAL
  Filled 2013-10-15 (×2): qty 1

## 2013-10-15 MED ORDER — FENTANYL CITRATE 0.05 MG/ML IJ SOLN: 50.0000 ug | Freq: Once | INTRAMUSCULAR | Status: DC

## 2013-10-15 MED ORDER — PANTOPRAZOLE SODIUM 40 MG IV SOLR
40.0000 mg | Freq: Every day | INTRAVENOUS | Status: DC
  Administered 2013-10-15: 40 mg via INTRAVENOUS
  Filled 2013-10-15 (×2): qty 40

## 2013-10-15 MED ORDER — PREGABALIN 100 MG PO CAPS
300.0000 mg | ORAL_CAPSULE | Freq: Two times a day (BID) | ORAL | Status: DC
  Administered 2013-10-15 – 2013-10-28 (×19): 300 mg via ORAL
  Filled 2013-10-15: qty 3
  Filled 2013-10-15: qty 6
  Filled 2013-10-15: qty 3
  Filled 2013-10-15 (×2): qty 6
  Filled 2013-10-15 (×2): qty 12
  Filled 2013-10-15: qty 3
  Filled 2013-10-15: qty 6
  Filled 2013-10-15 (×2): qty 3
  Filled 2013-10-15 (×3): qty 6
  Filled 2013-10-15: qty 3
  Filled 2013-10-15: qty 12
  Filled 2013-10-15 (×3): qty 3
  Filled 2013-10-15: qty 6

## 2013-10-15 MED ORDER — BIOTENE DRY MOUTH MT LIQD
15.0000 mL | Freq: Four times a day (QID) | OROMUCOSAL | Status: DC
  Administered 2013-10-15 (×2): 15 mL via OROMUCOSAL

## 2013-10-15 MED ORDER — SODIUM CHLORIDE 0.9 % IV SOLN: 250.0000 mL | INTRAVENOUS | Status: DC | PRN

## 2013-10-15 MED ORDER — FENTANYL CITRATE 0.05 MG/ML IJ SOLN
0.0000 ug/h | INTRAMUSCULAR | Status: DC
  Administered 2013-10-15: 150 ug/h via INTRAVENOUS
  Administered 2013-10-16: 100 ug/h via INTRAVENOUS
  Administered 2013-10-17: 200 ug/h via INTRAVENOUS
  Filled 2013-10-15 (×5): qty 50

## 2013-10-15 MED ORDER — FENTANYL CITRATE 0.05 MG/ML IJ SOLN
INTRAMUSCULAR | Status: AC
  Filled 2013-10-15: qty 6

## 2013-10-15 MED ORDER — FENTANYL CITRATE 0.05 MG/ML IJ SOLN
100.0000 ug | Freq: Once | INTRAMUSCULAR | Status: AC
  Administered 2013-10-15: 100 ug via INTRAVENOUS

## 2013-10-15 MED ORDER — MORPHINE SULFATE 2 MG/ML IJ SOLN
INTRAMUSCULAR | Status: AC
  Administered 2013-10-15: 2 mg via INTRAVENOUS
  Filled 2013-10-15: qty 1

## 2013-10-15 MED ORDER — POLYETHYLENE GLYCOL 3350 17 G PO PACK
17.0000 g | PACK | Freq: Every day | ORAL | Status: DC | PRN
  Filled 2013-10-15: qty 1

## 2013-10-15 MED ORDER — FENTANYL CITRATE 0.05 MG/ML IJ SOLN
200.0000 ug | Freq: Once | INTRAMUSCULAR | Status: AC
  Administered 2013-10-15: 200 ug via INTRAVENOUS

## 2013-10-15 MED ORDER — FENTANYL CITRATE 0.05 MG/ML IJ SOLN: 100.0000 ug | INTRAMUSCULAR | Status: DC | PRN

## 2013-10-15 MED ORDER — SODIUM CHLORIDE 0.45 % IV SOLN
INTRAVENOUS | Status: DC
  Administered 2013-10-15 – 2013-10-17 (×2): via INTRAVENOUS

## 2013-10-15 MED ORDER — FENTANYL 50 MCG/HR TD PT72: 50.0000 ug | MEDICATED_PATCH | TRANSDERMAL | Status: DC

## 2013-10-15 MED ORDER — DEXTROSE 5 % IV SOLN
2.0000 ug/min | INTRAVENOUS | Status: DC
  Administered 2013-10-15: 8 ug/min via INTRAVENOUS
  Administered 2013-10-15: 7.5 ug/min via INTRAVENOUS
  Administered 2013-10-16: 5 ug/min via INTRAVENOUS
  Administered 2013-10-16: 10 ug/min via INTRAVENOUS
  Administered 2013-10-17: 4 ug/min via INTRAVENOUS
  Administered 2013-10-17: 5 ug/min via INTRAVENOUS
  Filled 2013-10-15 (×6): qty 4

## 2013-10-15 MED ORDER — ETOMIDATE 2 MG/ML IV SOLN
20.0000 mg | Freq: Once | INTRAVENOUS | Status: AC
Start: 2013-10-15 — End: 2013-10-15
  Administered 2013-10-15: 20 mg via INTRAVENOUS

## 2013-10-15 MED ORDER — IOHEXOL 350 MG/ML SOLN
100.0000 mL | Freq: Once | INTRAVENOUS | Status: AC | PRN
  Administered 2013-10-15: 100 mL via INTRAVENOUS

## 2013-10-15 MED ORDER — DOCUSATE SODIUM 100 MG PO CAPS
100.0000 mg | ORAL_CAPSULE | Freq: Every day | ORAL | Status: DC | PRN
  Filled 2013-10-15: qty 1

## 2013-10-15 MED ORDER — ASPIRIN 300 MG RE SUPP
300.0000 mg | RECTAL | Status: AC
  Administered 2013-10-15: 300 mg via RECTAL
  Filled 2013-10-15: qty 1

## 2013-10-15 MED ORDER — MIDAZOLAM HCL 2 MG/2ML IJ SOLN
2.0000 mg | Freq: Once | INTRAMUSCULAR | Status: AC
  Administered 2013-10-15: 2 mg via INTRAVENOUS

## 2013-10-15 MED ORDER — OXEPA PO LIQD
1000.0000 mL | ORAL | Status: DC
  Administered 2013-10-15 – 2013-10-16 (×2): 1000 mL
  Filled 2013-10-15 (×3): qty 1000

## 2013-10-15 MED ORDER — OXCARBAZEPINE 300 MG/5ML PO SUSP
300.0000 mg | Freq: Two times a day (BID) | ORAL | Status: DC
  Filled 2013-10-15: qty 5

## 2013-10-15 MED ORDER — OSELTAMIVIR PHOSPHATE 75 MG PO CAPS
75.0000 mg | ORAL_CAPSULE | Freq: Two times a day (BID) | ORAL | Status: DC
  Filled 2013-10-15 (×2): qty 1

## 2013-10-15 MED ORDER — SUCCINYLCHOLINE CHLORIDE 20 MG/ML IJ SOLN
INTRAMUSCULAR | Status: DC | PRN
  Administered 2013-10-15: 100 mg via INTRAVENOUS

## 2013-10-15 MED ORDER — SODIUM CHLORIDE 0.9 % IV SOLN
0.0000 mg/h | INTRAVENOUS | Status: DC
  Administered 2013-10-15 (×3): 2 mg/h via INTRAVENOUS
  Administered 2013-10-16 (×2): 3 mg/h via INTRAVENOUS
  Administered 2013-10-17: 1 mg/h via INTRAVENOUS
  Administered 2013-10-18: 2 mg/h via INTRAVENOUS
  Administered 2013-10-18 – 2013-10-19 (×2): 3 mg/h via INTRAVENOUS
  Filled 2013-10-15 (×7): qty 10

## 2013-10-15 MED ORDER — PROPOFOL 10 MG/ML IV BOLUS
INTRAVENOUS | Status: DC | PRN
  Administered 2013-10-15: 150 mg via INTRAVENOUS

## 2013-10-15 MED ORDER — SODIUM CHLORIDE 0.9 % IV SOLN
0.0000 ug/h | INTRAVENOUS | Status: DC
  Administered 2013-10-15: 200 ug/h via INTRAVENOUS
  Filled 2013-10-15: qty 50

## 2013-10-15 MED ORDER — CHLORHEXIDINE GLUCONATE 0.12 % MT SOLN
15.0000 mL | Freq: Two times a day (BID) | OROMUCOSAL | Status: DC
  Administered 2013-10-15 – 2013-10-22 (×15): 15 mL via OROMUCOSAL
  Filled 2013-10-15 (×17): qty 15

## 2013-10-15 NOTE — Progress Notes (Signed)
CRITICAL VALUE ALERT  Critical value received: troponin 1.02  Date of notification:  10/15/12  Time of notification:  0440  Critical value read back: yes  Nurse who received alert:  [name] JacobsKasey Gerod Caligiuri  MD notified (1st page):  Ccm resident  Time of first page:  [phone]  Time MD responded:  [phone]

## 2013-10-15 NOTE — Anesthesia Postprocedure Evaluation (Addendum)
OOD intubation 

## 2013-10-15 NOTE — H&P (Signed)
Name: [name] [name] MRN: [id number] DOB: 29-Sep-1960    ADMISSION DATE:  10/14/2013 CONSULTATION DATE: 10/14/13  REFERRING MD :  ED PRIMARY SERVICE: PCCM  CHIEF COMPLAINT:  fever  BRIEF PATIENT DESCRIPTION: 53 yo female with recent admission for CAP re-presents with fever, leukocytosis, chest pain, and hypoxia in setting of possible seizure vs pre-syncope.  SIGNIFICANT EVENTS / STUDIES:  1/26 Abd [place] >>  LINES / TUBES: 1/26 PIV  CULTURES: 1/26 UCx 1/27 BCx  ANTIBIOTICS: 1/27  Vanc 1/27 Zosyn 1/27 Azithromycin  HISTORY OF PRESENT ILLNESS:  53 yo Hispanic female from [place] 2-3 months ago with hx significant for seizure disorder (no seizures in decades), fibromyalgia, chronic pain and recent admission for pneumonia discharged 2 days prior to presentation today. Reports that she was doing marginally better after discharge and completing course of Levaquin but then developed fever, chills, sore throat, and "left lung" pain.  States that her subjective fever got so high that she "passed out" .  Her brother states that her eyes rolled back into her head and she was briefly unresponsive until her mother gave her "2 breaths" for mouth-to mouth CPR. Family states that patient never stopped breathing.  She was then brought to ED via EMS and found to have temp 103F, pulse ~140 bpm, with bp 94/56 with leukocytosis >40K and CXR demonstrating improved right mid and upper lung opacities with worsening left upper lobe airspace disease.  During monitoring in ED pt became hypoxic with O2 83-85% on nasal canula requiring NRM. Critical Care was consulted for further management.  PAST MEDICAL HISTORY :  Past Medical History  Diagnosis Date  . Fibromyalgia   . Chronic pain   . Seizures   . Migraine   . Pneumonia    History reviewed. No pertinent past surgical history. Prior to Admission medications   Medication Sig Start Date End Date Taking? Authorizing Provider  docusate sodium 100 MG CAPS  Take 100 mg by mouth daily as needed for mild constipation. 10/10/13  Yes Darden Palmer, MD  etodolac (LODINE) 500 MG tablet Take 500 mg by mouth 2 (two) times daily.    Yes Historical Provider, MD  fentaNYL (DURAGESIC - DOSED MCG/HR) 50 MCG/HR Place 1 patch (50 mcg total) onto the skin every 3 (three) days. 10/10/13  Yes Darden Palmer, MD  Oxcarbazepine (TRILEPTAL) 300 MG tablet Take 1 tablet (300 mg total) by mouth 2 (two) times daily. 10/10/13  Yes Darden Palmer, MD  pantoprazole (PROTONIX) 40 MG tablet Take 40 mg by mouth 2 (two) times daily.   Yes Historical Provider, MD  polyethylene glycol (MIRALAX / GLYCOLAX) packet Take 17 g by mouth daily as needed for mild constipation or moderate constipation. 10/10/13  Yes Darden Palmer, MD  pregabalin (LYRICA) 300 MG capsule Take 1 capsule (300 mg total) by mouth 2 (two) times daily. 10/10/13  Yes Darden Palmer, MD  rizatriptan (MAXALT) 10 MG tablet Take 10 mg by mouth daily as needed for migraine. May repeat in 2 hours if needed   Yes Historical Provider, MD  traMADol (ULTRAM) 50 MG tablet Take 1 tablet (50 mg total) by mouth every 12 (twelve) hours as needed for moderate pain. 10/10/13  Yes Darden Palmer, MD   Allergies  Allergen Reactions  . Shellfish Allergy Anaphylaxis    FAMILY HISTORY:  No family history on file. SOCIAL HISTORY:  reports that she has never smoked. She has never used smokeless tobacco. She reports that she does not drink alcohol or  use illicit drugs.  Review of Systems: Constitutional:  Endorses fever, chills, No diaphoresis, appetite change and fatigue.  HEENT: Endorses sore throat, sneezing, Denies photophobia, eye pain, redness  Respiratory: Endorse cough with occasional green sputum production, intermittent SOB Denies  DOE, chest tightness, and wheezing.  Cardiovascular: Endorses left sided chest pain, Denies palpitations and leg swelling.  Gastrointestinal: Endorses nausea, epigastric abdominal pain, recent  constipation, Denies vomiting, blood in stool and abdominal distention. Has not eaten in 1 day  Genitourinary: Denies dysuria, urgency, frequency, hematuria, flank pain and difficulty urinating. Has h/o multiple kidney stones with 1 large one requiring extraction in [place]  Musculoskeletal: Endorses myalgias, left upper back "lung" pain, arthralgias  Skin: Denies rash  Neurological: Denies having a seizure but thinks she may have "passed out" today, Denies dizziness, numbness and headaches.   Hematological: Denies easy bruising or bleeding  Psychiatric/ Behavioral: Denies confusion or  agitation.     VITAL SIGNS: Temp:  [102 F (38.9 C)-103 F (39.4 C)] 102 F (38.9 C) (01/27 1915) Pulse Rate:  [103-142] 103 (01/27 2245) Resp:  [19-35] 19 (01/27 2245) BP: (92-134)/(56-83) 125/71 mmHg (01/27 2245) SpO2:  [83 %-100 %] 98 % (01/27 2245) FiO2 (%):  [50 %] 50 % (01/27 2134) Weight:  [133 lb 9.6 oz (60.6 kg)] 133 lb 9.6 oz (60.6 kg) (01/27 1815)    INTAKE / OUTPUT: Intake/Output   None     PHYSICAL EXAMINATION: General: Well-developed, well-nourished, Hispanic female, in mild distress; Head: Normocephalic, atraumatic. Eyes: PERRLA, EOMI, No signs of anemia or jaundice. Nose: Moist mucous membranes Throat: post-Oropharynx erythematous, no exudate appreciated,, dried whit Neck: supple, no masses, no carotid Bruits, no JVD appreciated. Lungs: Increased respiratory effort. Crackles throughout bilateral lung fields, no rhonchi appreciated Heart: tachycardic, regular rhythm, normal S1 and S2, no gallop, murmur, or rubs appreciated. Abdomen: BS normoactive. Soft, Nondistended, + epigastric tenderness. No masses or organomegaly appreciated. Extremities: No pretibial edema, distal pulses intact, no rashes appreciated Neurologic: grossly non-focal, alert and oriented x3, appropriate and cooperative throughout examination.     LABS:  CBC  Recent Labs Lab 10/08/13 0520 10/09/13 0400  10/14/13 1753  WBC 11.8* 9.7 41.3*  HGB 9.7* 9.6* 10.8*  HCT 29.7* 30.1* 33.0*  PLT 345 359 427*   BMET  Recent Labs Lab 10/08/13 0520 10/14/13 1753  NA 140 143  K 4.6 3.5*  CL 104 104  CO2 24 23  BUN 9 9  CREATININE 0.79 0.74  GLUCOSE 110* 117*   Electrolytes  Recent Labs Lab 10/08/13 0520 10/14/13 1753  CALCIUM 8.8 8.3*   Sepsis Markers  Recent Labs Lab 10/14/13 1905  LATICACIDVEN 2.97*   ABG  Recent Labs Lab 10/14/13 2116  PHART 7.239*  PCO2ART 62.5*  PO2ART 196.0*   Liver Enzymes  Recent Labs Lab 10/14/13 1753  AST 33  ALT 24  ALKPHOS 160*  BILITOT 0.5  ALBUMIN 2.2*   Cardiac Enzymes No results found for this basename: TROPONINI, PROBNP,  in the last 168 hours Glucose No results found for this basename: GLUCAP,  in the last 168 hours  Imaging Dg Chest Port 1 View  10/14/2013   CLINICAL DATA:  Pneumonia  EXAM: PORTABLE CHEST - 1 VIEW  COMPARISON:  10/05/2013  FINDINGS: Worsening poorly marginated airspace opacities in the left upper lobe. Some improvement in the alveolar and airspace opacities in the right mid and upper lung. Heart size normal. Effacement of the aorticopulmonary window suggesting adenopathy. . No effusion. Visualized skeletal structures are  unremarkable.  IMPRESSION: 1. Improving right and slightly increasing left airspace disease. 2. Possible AP window adenopathy.   Electronically Signed   By: Oley Balmaniel  Hassell M.D.   On: 10/14/2013 18:30    ASSESSMENT / PLAN:  PULMONARY A: acute hypoxic respiratory failure likely secondary to sepsis, describes "left lung pain" CAP Acute respiratory acidosis P:   Cont supplemental oxygen, currently >97% on NRB Consider CT of chest r/o PE although this would not account for her fever Cont Antibx  CARDIOVASCULAR A: tachycardic in setting of sepsis, hemodynamically stable with IVF P:  Cont to monitor Check troponin  RENAL A:  Hematuria with h/o large kidney stones (extraction ~2 years  ago in [name]) denies flank pain P:   Given c/o sore throat if positive for strep consider acute post-strep glomerulonephritis Abd US pending which can elucidate renal calculi  GASTROINTESTINAL A:  Abdominal pain unclear etiology, pt on bid Protonix home regimen, denies h/o ulcer Nausea without vomiting, hasn't eaten since day prior to admission secondary to fear of vomiting P:   Protonix IV 40 mg qd until resolution of nausea then convert to oral Check lipase Abd US pending  HEMATOLOGIC A:  Anemia of chronic disease DVT ppx P:  Hgb stable, cont to monitor Lovenox  INFECTIOUS A: CAP with concerning CXR, leukocytosis with unclear etiology in setting of recent Levaquin tx, febrile Sepsis (fever, tachycardic, leukocytosis)with unclear source, possibly pneumonia Pharyngitis with concern for Strep throat P:   Trend WBC, will get another CBC now to rule out possible post-ictal elevation of WBC Urine and Blood cultures pending Cont Vanc, Zosyn and Azithromycin Cont Tamiflu until flu swab results Check lipase, PCT Abdominal US to r/o GI process for source of sepsis Check rapid strep Check flu Check HIV Consider ID Consult in am (although wouldn't explian respiratory status, is [place] possible etiology of fever, arthrlagia given last visit to [place] at least 2 months ago/ Nov 2013?)  Recent Labs Lab 10/08/13 0520 10/09/13 0400 10/14/13 1753  WBC 11.8* 9.7 41.3*    ENDOCRINE A:  No issues, post-menopausal P:   Cont to monitor  NEUROLOGIC A:  Seizure disorder, question of reported seizure on day of admission, secondary to fever? currently not post-ictal on exam Fibromyalgia Chronic pain P:   Cont home regimen Tri-leptal Cont Lyrica Cont home fentanyl patch  Would hold Tramadol given seizure disorder   Kristie CowmanKaren Schooler, MD Internal Medicine Resident PGY3 10/15/2013, 12:16 AM   This pt was evaluated by myself and Dr Bosie ClosSchooler just prior to MN on 10/14/13. She is  readmitted after a recent hospitalization for what was thought to be CAP. Now she has severe sepsis of unclear etiology. Her CXR appears improved but she has significant leukocytosis with left shift. Her chest exam is markedly abnormal with diffuse crackles and epigastric tenderness without rebound or guarding. Our plan is as outlined above.   Billy Fischeravid Simonds, MD ; [facility] service Mobile [phone].  After 5:30 PM or weekends, call [phone]

## 2013-10-15 NOTE — Progress Notes (Signed)
Dr. Tyson AliasFeinstein notified of aBG, MD ordered RR increased to 30.

## 2013-10-15 NOTE — Progress Notes (Signed)
CRITICAL VALUE ALERT  Critical value received:  Ph 7.19, co2 67.1  Date of notification:  10/15/13  Time of notification:  0450  Critical value read back: yes  Nurse who received alert:  [name] JacobsKasey Praise Dolecki  MD notified (1st page):  elink  Time MD responded:  [phone]

## 2013-10-15 NOTE — Significant Event (Addendum)
PCCM Event Note         Pt with significant increased work of breathing since ED evaluation.  ABG with respiratory acidosis, pt with O2 sat 100% on Bipap, CTA with bilateral prominent upper lobe mixed airspace and ground-glass opacities favoring multi lobar pneumonia, as well as mediastinal and bilateral hilar adenopathy.  E-link consulted. Consented to mechanical ventilation. Anesthesia consulted for intubation. Attempted to locate pt's brother to inform of change in respiratory status requiring intubation.  Unable to locate in waiting rooms. Contacted listed number (sister Clerance LavSandra Bolls [phone]) and updated family.  Kristie CowmanKaren Alexes Lamarque, MD Internal Medicine Resident, PGY3

## 2013-10-15 NOTE — Progress Notes (Signed)
Pt unavailable for EEG due CT scheduled. Will schedule for AM.

## 2013-10-15 NOTE — Progress Notes (Signed)
Noted pt orders for ARDS- on 8 ml/kg pt is less than Pplat goal, will continue pt on 8 ml/kg.

## 2013-10-15 NOTE — Procedures (Signed)
Bronchoscopy Procedure Note [name] [name] [id number] 02/05/1961  Procedure: Bronchoscopy Indications: Diagnostic evaluation of the airways  Procedure Details Consent: Risks of procedure as well as the alternatives and risks of each were explained to the (patient/caregiver).  Consent for procedure obtained. Time Out: Verified patient identification, verified procedure, site/side was marked, verified correct patient position, special equipment/implants available, medications/allergies/relevent history reviewed, required imaging and test results available.  Performed  In preparation for procedure, patient was given 100% FiO2 and bronchoscope lubricated. Sedation: Etomidate  Airway entered and the following bronchi were examined: RUL, RML, RLL, LUL, LLL and Bronchi.   Procedures performed: Brushings performed-no Bronchoscope removed.  , Patient placed back on 100% FiO2 at conclusion of procedure.    Evaluation Hemodynamic Status: BP stable throughout; O2 sats: stable throughout Patient's Current Condition: stable Specimens:  Sent serosanguinous fluid Complications: No apparent complications Patient did tolerate procedure well.   Nelda BucksFEINSTEIN,[name] J. 10/15/2013   1. Unimpressive secretions 2. NO DAH 3. BAL mucous thin white, RUL, RML 4. No hsv lesions 5. No mass lesions  Mcarthur Rossettianiel J. Tyson AliasFeinstein, MD, FACP Pgr: [phone] [facility]

## 2013-10-15 NOTE — Progress Notes (Signed)
  Echocardiogram 2D Echocardiogram has been performed.  [name] [name] 10/15/2013, 6:25 PM

## 2013-10-15 NOTE — Progress Notes (Addendum)
Increased fio2 to 100% d/t prep for bronch. At 1535, post bronch- pt returned to 40%

## 2013-10-15 NOTE — Progress Notes (Signed)
eLink Physician-Brief Progress Note Patient Name: [name] [name] DOB: 04/08/1961 MRN: [id number]  Date of Service  10/15/2013   HPI/Events of Note   Marked respiratory distress upon arrival from the ER BIPAP mask applied but not providing immediate relief  eICU Interventions  Morphine, lasix now Discussed with anesthesia, they are coming for intubation   Intervention Category Major Interventions: Airway management;Respiratory failure - evaluation and management  MCQUAID, DOUGLAS 10/15/2013, 3:09 AM

## 2013-10-15 NOTE — Anesthesia Preprocedure Evaluation (Signed)
Anesthesia Evaluation Anesthesia Physical Anesthesia Plan  ASA: IV and emergent  Anesthesia Plan: General   Post-op Pain Management:    Induction: Intravenous, Rapid sequence and Cricoid pressure planned  Airway Management Planned: Oral ETT  Additional Equipment:   Intra-op Plan:   Post-operative Plan: Post-operative intubation/ventilation  Informed Consent:   Plan Discussed with:   Anesthesia Plan Comments: (See surgeon's H&P)        Anesthesia Quick Evaluation

## 2013-10-15 NOTE — Procedures (Signed)
PROCEDURE:  Radial artery line placement. (A-line)  INDICATION:  Severe Sepsis with septic shock, Acute Respiratory Failure  PROCEDURE OPERATOR: Carlynn PurlErik Hoffman, DO, Rory Percyaniel Canary Fister MD  CONSENT: Obtained from Rusty AusMevin Toomey  PROCEDURE SUMMARY:  The patient was prepped and draped in the usual sterile manner using chlorhexidine scrub. 1% lidocaine was used to numb the region. The right radial artery was palpated and successfully cannulated on the first pass. Pulsatile, arterial blood was visualized and the artery was then threaded using the Seldinger technique and a catheter was then sutured into place. Good wave-form was obtained. The patient tolerated the procedure well without any immediate complications. The area was cleaned and Tegaderm was applied.  ESTIMATED BLOOD LOSS: 2cc  Performed with resident  [place] guidance ARDS  Mcarthur RossettiDaniel J. Tyson AliasFeinstein, MD, FACP Pgr: [phone] [facility]

## 2013-10-15 NOTE — Progress Notes (Signed)
PULMONOLOGY/CRITICAL CARE   Name: [name] [name] MRN: [id number] DOB: 1960-09-24    ADMISSION DATE:  10/14/2013 CONSULTATION DATE: 10/14/13  REFERRING MD :  ED PRIMARY SERVICE: PCCM  CHIEF COMPLAINT:  fever  BRIEF PATIENT DESCRIPTION: 53 yo female with recent admission for CAP re-presents with fever, leukocytosis, chest pain, and hypoxia in setting of possible seizure vs pre-syncope.  SIGNIFICANT EVENTS / STUDIES:  1/28 Abd [place] >>No hydronephrosis, Left kidney 8mm stone?, Right kidney 6mm stone? Mild intra and extrahepatic biliary ductal prominence,  1/28 CTA chest >>> No PE.  Bilateral prominently upper lobe mixed airspace and ground-glass opacities, multi lobar pneumonia is favored.  Mediastinal and bilateral hilar adenopathy   LINES / TUBES: 1/28 LIJ >>> 1/28 OETT >>> 1/28 Foley >>  CULTURES: 1/18 Blood cultures >> Neg 1/27 UCx >>>> 1/27 BCx >>>> 1/28 Strep culture >>> 1/28 MRSA >>> neg  ANTIBIOTICS: Home on levopfloxacin 1/27  Vanc >>> 1/27 Zosyn >>> 1/27 Azithromycin >>>  HISTORY OF PRESENT ILLNESS:  53 yo Hispanic female from [place] 2-3 months ago with hx significant for seizure disorder (no seizures in decades), fibromyalgia, chronic pain and recent admission for pneumonia discharged 2 days prior to presentation today. Reports that she was doing marginally better after discharge and completing course of Levaquin but then developed fever, chills, sore throat, and "left lung" pain.  States that her subjective fever got so high that she "passed out" .  Her brother states that her eyes rolled back into her head and she was briefly unresponsive until her mother gave her "2 breaths" for mouth-to mouth CPR. Family states that patient never stopped breathing.  She was then brought to ED via EMS and found to have temp 103F, pulse ~140 bpm, with bp 94/56 with leukocytosis >40K and CXR demonstrating improved right mid and upper lung opacities with worsening left upper lobe airspace  disease.  During monitoring in ED pt became hypoxic with O2 83-85% on nasal canula requiring NRM. Critical Care was consulted for further management.  VITAL SIGNS: Temp:  [98 F (36.7 C)-103 F (39.4 C)] 98 F (36.7 C) (01/28 0852) Pulse Rate:  [63-142] 75 (01/28 0900) Resp:  [15-36] 20 (01/28 0900) BP: (72-192)/(42-136) 72/42 mmHg (01/28 0900) SpO2:  [83 %-100 %] 99 % (01/28 0900) FiO2 (%):  [40 %-100 %] 40 % (01/28 0735) Weight:  [129 lb 3 oz (58.6 kg)-133 lb 9.6 oz (60.6 kg)] 129 lb 3 oz (58.6 kg) (01/28 0400)    INTAKE / OUTPUT: Intake/Output     01/27 0701 - 01/28 0700 01/28 0701 - 01/29 0700   I.V. (mL/kg) 1901.7 (32.5) 125 (2.1)   IV Piggyback 50 1000   Total Intake(mL/kg) 1951.7 (33.3) 1125 (19.2)   Urine (mL/kg/hr) 600    Total Output 600     Net +1351.7 +1125          PHYSICAL EXAMINATION: General: Sedated, OETT in place HEENT:[place], OETT in place Cardiac: RRR, no rubs, murmurs or gallops Pulm: mechanical breath sounds, crackles over right middle and upper lobes Abd: soft, nontender, nondistended, BS present Ext: warm and well perfused, no pedal edema Neuro: sedated RASS -1   LABS:  CBC  Recent Labs Lab 10/14/13 1753 10/15/13 0120 10/15/13 0442  WBC 41.3* 49.1* 46.6*  HGB 10.8* 10.7* 9.6*  HCT 33.0* 33.5* 30.2*  PLT 427* 422* 391   BMET  Recent Labs Lab 10/14/13 1753 10/15/13 0442  NA 143 145  K 3.5* 3.7  CL 104 111  CO2  23 23  BUN 9 6  CREATININE 0.74 0.62  GLUCOSE 117* 118*   Electrolytes  Recent Labs Lab 10/14/13 1753 10/15/13 0442  CALCIUM 8.3* 7.7*   Sepsis Markers  Recent Labs Lab 10/14/13 1905 10/15/13 0120  LATICACIDVEN 2.97*  --   PROCALCITON  --  2.19   ABG  Recent Labs Lab 10/14/13 2116 10/15/13 0452  PHART 7.239* 7.196*  PCO2ART 62.5* 67.1*  PO2ART 196.0* 312.0*   Liver Enzymes  Recent Labs Lab 10/14/13 1753  AST 33  ALT 24  ALKPHOS 160*  BILITOT 0.5  ALBUMIN 2.2*   Cardiac Enzymes  Recent  Labs Lab 10/15/13 0442  TROPONINI 1.02*   Glucose  Recent Labs Lab 10/15/13 0831  GLUCAP 106*   CXR 1/28 >>> Hardware in good position, bilateral airspace disease  ASSESSMENT / PLAN:  PULMONARY A:  acute hypoxic respiratory failure likely secondary to pneumonia, r/o superinfection post CAP vs flu ARDS HCAP likely Acute respiratory acidosis Is there a component of subacute ILD? P:   Will add ARDS protocol Place a line pcxr in am  CT re reviewed wit5h radiology, appreciated Consider Bronch for DX today abg to follow To goal plat 25-30 See ID May need autoimmune work up, pa pressures were up to some degree, will send given CT for now  CARDIOVASCULAR A:  Hypotensive secondary to septic shock Elevated troponin Mild PA pressures noted P:  Insert A-line Levophed drip Sepsis protocol CVP monitoring Ground glass, assess ef ad PA pressures, I have reviewed echo done 9 days ago  RENAL A:   Hematuria likely secondary to nephrolithiasis, no hydronephrosis Hypokalemia resolved Mild hyperchlroremia P:   BMP in AM Change to 1/2 NS Repeat UA for active sed and wbc now, rbc likely from stiones  GASTROINTESTINAL A:  Abdominal pain unclear etiology, from resp?, pt on bid Protonix home regimen, denies h/o ulcer, lipase negative, AST/ALT/ Bili wnl nonspecific findings on Abdominal U/S.  P:   Protonix  Assess amy, lip Start TF , oxepa lft again in am  May need CT  HEMATOLOGIC A:  Anemia of chronic disease Leukocytosis DVT ppx P:  Hgb stable, cont to monitor Lovenox  INFECTIOUS A:   HCAP, vs flu (neg pcr prior) and super infection Severe Sepsis  Pharyngitis -strep swab neg P:   Cultures as above Cont Vanc, Zosyn and Azithromycin Cont Tamiflu until flu bronch neg Consider BAl done for Dx HIV testing send Sending auto immune work up  ENDOCRINE A:   R/o rel AI P:   Cont to monitor Consider checking cortisol, add stress empiric  roids tsh  NEUROLOGIC A:   Seizure disorder, question of reported seizure  Fibromyalgia Chronic pain P:   Cont home regimen Tri-leptal Cont Lyrica  Sedation with Fentanyl, Versed Eeg, has followed commands, may need head CT  Global: for bronch, ARDS, A line, cont tamiflu   Carlynn PurlErik Hoffman, DO Internal Medicine Resident PGY1 10/15/2013, 9:06 AM  Ccm time 45 min   I have fully examined this patient and agree with above findings.    And edited in full  Mcarthur RossettiDaniel J. Tyson AliasFeinstein, MD, FACP Pgr: [phone] [facility]

## 2013-10-15 NOTE — Progress Notes (Signed)
Unable to obtain ABG, MD aware and planning to place an Aline.

## 2013-10-15 NOTE — Procedures (Signed)
Central Venous Catheter Insertion Procedure Note Maxwell Caulieves Robling [id number] 10/31/1960  Procedure: Insertion of Central Venous Catheter Indications: Assessment of intravascular volume, Drug and/or fluid administration and Frequent blood sampling  Procedure Details Consent: Risks of procedure as well as the alternatives and risks of each were explained to the (patient/caregiver).  Consent for procedure obtained. Time Out: Verified patient identification, verified procedure, site/side was marked, verified correct patient position, special equipment/implants available, medications/allergies/relevent history reviewed, required imaging and test results available.  Performed  Maximum sterile technique was used including antiseptics, cap, gloves, gown, hand hygiene, mask and sheet. Skin prep: Chlorhexidine; local anesthetic administered  A antimicrobial bonded/coated triple lumen catheter was placed in the left internal jugular vein using the Seldinger technique.  Evaluation Blood flow good Complications: No apparent complications Patient did tolerate procedure well. Chest X-ray ordered to verify placement.  CXR: pending.  Procedure performed with ultrasound guidance for real time vessel cannulation.     Canary BrimBrandi Ollis, NP-C [facility] Pgr: [phone] or [phone]     10/15/2013, 4:26 AM  Billy Fischeravid Simonds, MD ; [facility] [phone].  After 5:30 PM or weekends, call [phone]

## 2013-10-15 NOTE — Progress Notes (Signed)
Bronch sample walked to lab w/ approp requisitions, labeled.

## 2013-10-15 NOTE — Progress Notes (Signed)
INITIAL NUTRITION ASSESSMENT  DOCUMENTATION CODES Per approved criteria  -Not Applicable   INTERVENTION: 1.  Enteral nutrition; If TFs warranted, recommend initiation of TF via OGT with Vital AF 1.2 at 25 ml/h for day 1; on day 2 of TF, increase to goal rate of 60 mL/hr to provide 1728 kcals, 108g protein, 1180 ml free water daily.    NUTRITION DIAGNOSIS: Inadequate oral intake related to inability to eat as evidenced by vent, NPO.   Monitor:  1.  Enteral nutrition; initiation with tolerance.  Pt to meet >/=90% estimated needs with nutrition support.  2.  Wt/wt change; monitor trends  Reason for Assessment: Vent  53 y.o. female  Admitting Dx: Acute respiratory failure with hypoxia  ASSESSMENT: Pt admitted with fever and respiratory distress in setting of PNA, possible seizure, and pre-syncope.   Patient is currently intubated on ventilator support.  MV: 9.6 L/min Temp (24hrs), Avg:101 F (38.3 C), Min:98 F (36.7 C), Max:103 F (39.4 C)  Propofol: none  Discussed with RN who states pt will likely remain intubated.  Nutrition hx largely unknown. Wt has been variable over the one week ranging from 129-139 lbs.  Pt with recent admission for CAP.   Nutrition Focused Physical Exam: Subcutaneous Fat:  Orbital Region: WNL Upper Arm Region: WNL Thoracic and Lumbar Region: WNL  Muscle:  Temple Region: WNL Clavicle Bone Region: WNL Clavicle and Acromion Bone Region: WNL Scapular Bone Region: WNL Dorsal Hand: unable to assess Patellar Region: WNL Anterior Thigh Region: WNL Posterior Calf Region: WNL  Edema: none present  Height: Ht Readings from Last 1 Encounters:  10/15/13 5\' 1"  (1.549 m)    Weight: Wt Readings from Last 1 Encounters:  10/15/13 129 lb 3 oz (58.6 kg)    Ideal Body Weight: 105 lbs  % Ideal Body Weight: 122%  Wt Readings from Last 10 Encounters:  10/15/13 129 lb 3 oz (58.6 kg)  10/09/13 133 lb 9.6 oz (60.601 kg)    Usual Body Weight: 130  lbs  % Usual Body Weight: 99%  BMI:  Body mass index is 24.42 kg/(m^2).  Estimated Nutritional Needs: Kcal: 1758 Protein: 82-95g Fluid: >1.8 L/day  Skin: intact  Diet Order:  NPO  EDUCATION NEEDS: -Education not appropriate at this time   Intake/Output Summary (Last 24 hours) at 10/15/13 1045 Last data filed at 10/15/13 0800  Gross per 24 hour  Intake 3076.67 ml  Output    850 ml  Net 2226.67 ml    Last BM: PTA  Labs:   Recent Labs Lab 10/14/13 1753 10/15/13 0442  NA 143 145  K 3.5* 3.7  CL 104 111  CO2 23 23  BUN 9 6  CREATININE 0.74 0.62  CALCIUM 8.3* 7.7*  GLUCOSE 117* 118*    CBG (last 3)   Recent Labs  10/15/13 0831  GLUCAP 106*    Scheduled Meds: . antiseptic oral rinse  15 mL Mouth Rinse QID  . azithromycin  500 mg Intravenous Q24H  . chlorhexidine  15 mL Mouth Rinse BID  . enoxaparin (LOVENOX) injection  40 mg Subcutaneous Q24H  . furosemide  40 mg Intravenous Once  . Oxcarbazepine  300 mg Per Tube BID  . pantoprazole (PROTONIX) IV  40 mg Intravenous Daily  . piperacillin-tazobactam (ZOSYN)  IV  3.375 g Intravenous Q8H  . pregabalin  300 mg Oral BID  . sodium chloride  1,000 mL Intravenous Once  . vancomycin  750 mg Intravenous Q12H    Continuous Infusions: .  sodium chloride 1,000 mL (10/15/13 0300)  . fentaNYL infusion INTRAVENOUS 125 mcg/hr (10/15/13 0843)  . midazolam (VERSED) infusion 2 mg/hr (10/15/13 0843)  . norepinephrine (LEVOPHED) Adult infusion 7.5 mcg/min (10/15/13 1020)    Past Medical History  Diagnosis Date  . Fibromyalgia   . Chronic pain   . Seizures   . Migraine   . Pneumonia     History reviewed. No pertinent past surgical history.  Loyce Dys, MS RD LDN Clinical Inpatient Dietitian Pager: [phone] Weekend/After hours pager: [phone]

## 2013-10-16 ENCOUNTER — Inpatient Hospital Stay (HOSPITAL_COMMUNITY): Payer: Medicaid Other

## 2013-10-16 DIAGNOSIS — IMO0001 Reserved for inherently not codable concepts without codable children: Secondary | ICD-10-CM

## 2013-10-16 DIAGNOSIS — G8929 Other chronic pain: Secondary | ICD-10-CM

## 2013-10-16 DIAGNOSIS — G40909 Epilepsy, unspecified, not intractable, without status epilepticus: Secondary | ICD-10-CM

## 2013-10-16 LAB — HEPATIC FUNCTION PANEL
ALT: 14 U/L (ref 0–35)
AST: 18 U/L (ref 0–37)
Albumin: 1.5 g/dL — ABNORMAL LOW (ref 3.5–5.2)
Alkaline Phosphatase: 128 U/L — ABNORMAL HIGH (ref 39–117)
Bilirubin, Direct: <0.2 mg/dL (ref 0.0–0.3)
Total Bilirubin: <0.2 mg/dL — ABNORMAL LOW (ref 0.3–1.2)
Total Protein: 5.3 g/dL — ABNORMAL LOW (ref 6.0–8.3)

## 2013-10-16 LAB — ANCA SCREEN W REFLEX TITER
Atypical p-ANCA Screen: NEGATIVE
P-ANCA SCREEN: POSITIVE — AB
c-ANCA Screen: NEGATIVE

## 2013-10-16 LAB — ANTI-NUCLEAR AB-TITER (ANA TITER)

## 2013-10-16 LAB — POCT I-STAT 3, ART BLOOD GAS (G3+)
Acid-base deficit: 3 mmol/L — ABNORMAL HIGH (ref 0.0–2.0)
BICARBONATE: 23.9 meq/L (ref 20.0–24.0)
O2 Saturation: 91 %
PH ART: 7.304 — AB (ref 7.350–7.450)
PO2 ART: 68 mmHg — AB (ref 80.0–100.0)
TCO2: 25 mmol/L (ref 0–100)
pCO2 arterial: 48.3 mmHg — ABNORMAL HIGH (ref 35.0–45.0)

## 2013-10-16 LAB — T4, FREE: Free T4: 0.69 ng/dL — ABNORMAL LOW (ref 0.80–1.80)

## 2013-10-16 LAB — CBC
HCT: 27.4 % — ABNORMAL LOW (ref 36.0–46.0)
Hemoglobin: 8.6 g/dL — ABNORMAL LOW (ref 12.0–15.0)
MCH: 28.1 pg (ref 26.0–34.0)
MCHC: 31.4 g/dL (ref 30.0–36.0)
MCV: 89.5 fL (ref 78.0–100.0)
Platelets: 386 10*3/uL (ref 150–400)
RBC: 3.06 MIL/uL — AB (ref 3.87–5.11)
RDW: 16 % — ABNORMAL HIGH (ref 11.5–15.5)
WBC: 24.9 10*3/uL — ABNORMAL HIGH (ref 4.0–10.5)

## 2013-10-16 LAB — BASIC METABOLIC PANEL
BUN: 7 mg/dL (ref 6–23)
CO2: 21 meq/L (ref 19–32)
Calcium: 7.4 mg/dL — ABNORMAL LOW (ref 8.4–10.5)
Chloride: 109 mEq/L (ref 96–112)
Creatinine, Ser: 0.64 mg/dL (ref 0.50–1.10)
Glucose, Bld: 115 mg/dL — ABNORMAL HIGH (ref 70–99)
Potassium: 3.3 mEq/L — ABNORMAL LOW (ref 3.7–5.3)
SODIUM: 141 meq/L (ref 137–147)

## 2013-10-16 LAB — MAGNESIUM: Magnesium: 1.9 mg/dL (ref 1.5–2.5)

## 2013-10-16 LAB — GLUCOSE, CAPILLARY
GLUCOSE-CAPILLARY: 126 mg/dL — AB (ref 70–99)
GLUCOSE-CAPILLARY: 72 mg/dL (ref 70–99)
Glucose-Capillary: 106 mg/dL — ABNORMAL HIGH (ref 70–99)
Glucose-Capillary: 111 mg/dL — ABNORMAL HIGH (ref 70–99)
Glucose-Capillary: 162 mg/dL — ABNORMAL HIGH (ref 70–99)
Glucose-Capillary: 155 mg/dL — ABNORMAL HIGH (ref 70–99)

## 2013-10-16 LAB — ANTI-DNA ANTIBODY, DOUBLE-STRANDED: ds DNA Ab: 1 IU/mL

## 2013-10-16 LAB — ANA: Anti Nuclear Antibody(ANA): POSITIVE — AB

## 2013-10-16 LAB — PATHOLOGIST SMEAR REVIEW: Path Review: INCREASED

## 2013-10-16 LAB — C4 COMPLEMENT: Complement C4, Body Fluid: 22 mg/dL (ref 10–40)

## 2013-10-16 LAB — ANCA TITERS: P-ANCA: 1:20 {titer} — ABNORMAL HIGH

## 2013-10-16 LAB — T3: T3, Total: 39.4 ng/dl — ABNORMAL LOW (ref 80.0–204.0)

## 2013-10-16 LAB — MPO/PR-3 (ANCA) ANTIBODIES: Myeloperoxidase Abs: 1.2 — ABNORMAL HIGH

## 2013-10-16 LAB — C3 COMPLEMENT: C3 COMPLEMENT: 108 mg/dL (ref 90–180)

## 2013-10-16 LAB — PROCALCITONIN: Procalcitonin: 2.51 ng/mL

## 2013-10-16 MED ORDER — POTASSIUM CHLORIDE 20 MEQ/15ML (10%) PO LIQD
40.0000 meq | Freq: Once | ORAL | Status: AC
  Administered 2013-10-16: 40 meq
  Filled 2013-10-16: qty 30

## 2013-10-16 MED ORDER — SODIUM CHLORIDE 0.9 % IV SOLN
Freq: Once | INTRAVENOUS | Status: AC
  Administered 2013-10-16: 1000 mL/h via INTRAVENOUS

## 2013-10-16 MED ORDER — OXEPA PO LIQD
1000.0000 mL | ORAL | Status: DC
  Administered 2013-10-16: 1000 mL
  Filled 2013-10-16 (×5): qty 1000

## 2013-10-16 MED ORDER — SODIUM CHLORIDE 0.9 % IV BOLUS (SEPSIS)
1000.0000 mL | Freq: Once | INTRAVENOUS | Status: AC
  Administered 2013-10-16: 1000 mL via INTRAVENOUS

## 2013-10-16 MED ORDER — PRO-STAT SUGAR FREE PO LIQD
30.0000 mL | Freq: Two times a day (BID) | ORAL | Status: DC
  Administered 2013-10-16 – 2013-10-18 (×6): 30 mL
  Filled 2013-10-16 (×12): qty 30

## 2013-10-16 MED ORDER — HYDROCORTISONE NA SUCCINATE PF 100 MG IJ SOLR
50.0000 mg | Freq: Four times a day (QID) | INTRAMUSCULAR | Status: DC
  Administered 2013-10-16 – 2013-10-17 (×5): 50 mg via INTRAVENOUS
  Filled 2013-10-16 (×9): qty 1

## 2013-10-16 MED ORDER — FOLIC ACID 5 MG/ML IJ SOLN: 1.0000 mg | Freq: Every day | INTRAMUSCULAR | Status: DC

## 2013-10-16 MED ORDER — PANTOPRAZOLE SODIUM 40 MG PO PACK
40.0000 mg | PACK | Freq: Every day | ORAL | Status: DC
  Administered 2013-10-16 – 2013-10-18 (×3): 40 mg
  Filled 2013-10-16 (×5): qty 20

## 2013-10-16 MED ORDER — FOLIC ACID 1 MG PO TABS
1.0000 mg | ORAL_TABLET | Freq: Every day | ORAL | Status: DC
  Administered 2013-10-16 – 2013-10-28 (×9): 1 mg
  Filled 2013-10-16 (×13): qty 1

## 2013-10-16 MED ORDER — OXEPA PO LIQD
1000.0000 mL | ORAL | Status: DC
  Filled 2013-10-16: qty 1000

## 2013-10-16 NOTE — Procedures (Signed)
ELECTROENCEPHALOGRAM REPORT   Patient: [name] [name]       Room #: 1O102M04 EEG No. ID: [id number] Age: 53 y.o.        Sex: female Referring Physician: Tyson AliasFeinstein Report Date:  10/16/2013        Interpreting Physician: Thana FarrEYNOLDS,Marcelis Wissner D  History: [name] [name] is an 53 y.o. female with a history of syncope and possible seizure.  Medications:  Scheduled: . antiseptic oral rinse  15 mL Mouth Rinse QID  . chlorhexidine  15 mL Mouth Rinse BID  . enoxaparin (LOVENOX) injection  40 mg Subcutaneous Q24H  . feeding supplement (PRO-STAT SUGAR FREE 64)  30 mL Per Tube BID  . folic acid  1 mg Per Tube Daily  . hydrocortisone sod succinate (SOLU-CORTEF) inj  50 mg Intravenous Q6H  . oseltamivir  75 mg Oral BID  . Oxcarbazepine  300 mg Per Tube BID  . pantoprazole sodium  40 mg Per Tube Daily  . piperacillin-tazobactam (ZOSYN)  IV  3.375 g Intravenous Q8H  . pregabalin  300 mg Oral BID  . sodium chloride  1,000 mL Intravenous Once  . vancomycin  750 mg Intravenous Q12H    Conditions of Recording:  This is a 16 channel EEG carried out with the patient in the sedated and ventilated state.    Description:  The background activity is not continuous.  The majority of the background consists of a mixture of poorly organized beta and alpha activity that is diffusely distributed and of low voltage.  Embedded are sharp transients that originate centrally and take on features of vertex central transients.  Only on rare occasions are slower rhythms noted.  At times during the tracing this background activity is interrupted by periods of attenuation that are generalized and synchronous.  These periods last from 5 to 15 seconds.   Hyperventilation and intermittent photic stimulation were not performed.  The patient was stimulated both verbally and with painful stimuli but no change in the background activity was noted.    IMPRESSION: This is an abnormal electroencephalogram due to its discontinuous nature but is  consistent with current medication list.  The sharp activity noted is not likely epileptiform in nature.  No evidence of electrographic seizure activity was noted.     Thana FarrLeslie Mahmood Boehringer, MD [facility] [phone] 10/16/2013, 7:42 PM

## 2013-10-16 NOTE — Progress Notes (Signed)
PULMONOLOGY/CRITICAL CARE   Name: [name] [name] MRN: [id number] DOB: 17-Dec-1960    ADMISSION DATE:  10/14/2013 CONSULTATION DATE: 10/14/13  REFERRING MD :  ED PRIMARY SERVICE: PCCM  CHIEF COMPLAINT:  fever  BRIEF PATIENT DESCRIPTION: 53 yo female with recent admission for CAP re-presents with fever, leukocytosis, chest pain, and hypoxia in setting of possible seizure vs pre-syncope.  SIGNIFICANT EVENTS / STUDIES:  1/28 Abd [place] >>No hydronephrosis, Left kidney 8mm stone?, Right kidney 6mm stone? Mild intra and extrahepatic biliary ductal prominence,  1/28 CTA chest >>> No PE.  Bilateral prominently upper lobe mixed airspace and ground-glass opacities, multi lobar pneumonia is favored.  Mediastinal and bilateral hilar adenopathy  1/28 Bronchoscopy >>> unimpressive secretions, no DAH, no hsv lesions, no mass lesions, BAL sent. 1/28 TTE >>> EF 65-70%, grade 1 DD, PA peak 38, no change from prior 1/28 CT head >>> Negative for acute process. 1/29 EEG >>>  LINES / TUBES: 1/28 LIJ >>> 1/28 OETT >>> 1/28 Foley >> 1/29 A line >>>  CULTURES: 1/18 Blood cultures >> Neg 1/27 UCx >>>> Neg 1/27 BCx >>>> 1/28 Strep culture >>> 1/28 MRSA >>> neg 1/28 Pneumocystis smear (BAL) >>> neg 1/28 AFB (BAL) >>>> 1/28 Resp Culture (BAL) >>> 1/28 Fungus Culture (BAL) >>> 1/28 GAS (BAL) >>>> HIV neg  ANTIBIOTICS: Home on levopfloxacin 1/27  Vanc >>> 1/27 Zosyn >>> 1/27 Azithromycin >>>  HISTORY OF PRESENT ILLNESS:  53 yo [place] female from [place] 2-3 months ago with hx significant for seizure disorder (no seizures in decades), fibromyalgia, chronic pain and recent admission for pneumonia discharged 2 days prior to presentation today. Reports that she was doing marginally better after discharge and completing course of Levaquin but then developed fever, chills, sore throat, and "left lung" pain.  States that her subjective fever got so high that she "passed out" .  Her brother states that her eyes  rolled back into her head and she was briefly unresponsive until her mother gave her "2 breaths" for mouth-to mouth CPR. Family states that patient never stopped breathing.  She was then brought to ED via EMS and found to have temp 103F, pulse ~140 bpm, with bp 94/56 with leukocytosis >40K and CXR demonstrating improved right mid and upper lung opacities with worsening left upper lobe airspace disease.  During monitoring in ED pt became hypoxic with O2 83-85% on nasal canula requiring NRM. Critical Care was consulted for further management.  VITAL SIGNS: Temp:  [97.8 F (36.6 C)-99.4 F (37.4 C)] 99.3 F (37.4 C) (01/29 0420) Pulse Rate:  [57-109] 103 (01/29 0500) Resp:  [15-36] 33 (01/29 0500) BP: (72-129)/(36-86) 107/55 mmHg (01/29 0351) SpO2:  [92 %-100 %] 97 % (01/29 0500) Arterial Line BP: (98-147)/(46-67) 98/60 mmHg (01/29 0500) FiO2 (%):  [40 %-100 %] 40 % (01/29 0351) Weight:  [133 lb 13.1 oz (60.7 kg)] 133 lb 13.1 oz (60.7 kg) (01/29 0500)  CVP:  [8 mmHg-10 mmHg] 10 mmHg INTAKE / OUTPUT: Intake/Output     01/28 0701 - 01/29 0700 01/29 0701 - 01/30 0700   I.V. (mL/kg) 3501.4 (57.7)    NG/GT 460    IV Piggyback 1287.5    Total Intake(mL/kg) 5248.9 (86.5)    Urine (mL/kg/hr) 2025 (1.4)    Total Output 2025     Net +3223.9           SUBJECTIVE: Levophed weaned to off overnight, had to be resumed this AM. Afebrile overnight. WBC trending down.  PHYSICAL EXAMINATION: General: Sedated, OETT  in place HEENT:[place], OETT in place Cardiac: RRR, no rubs, murmurs or gallops Pulm: mechanical breath sounds, course breath sounds bilaterally Abd: soft, nontender, nondistended, hypoactive bowel sounds Ext: warm and well perfused, 2+ pulses in all 4 extremities Neuro: sedated RASS -3   LABS:  CBC  Recent Labs Lab 10/15/13 0120 10/15/13 0442 10/16/13 0409  WBC 49.1* 46.6* 24.9*  HGB 10.7* 9.6* 8.6*  HCT 33.5* 30.2* 27.4*  PLT 422* 391 386   BMET  Recent Labs Lab  10/14/13 1753 10/15/13 0442 10/16/13 0409  NA 143 145 141  K 3.5* 3.7 3.3*  CL 104 111 109  CO2 23 23 21   BUN 9 6 7   CREATININE 0.74 0.62 0.64  GLUCOSE 117* 118* 115*   Electrolytes  Recent Labs Lab 10/14/13 1753 10/15/13 0442 10/16/13 0409  CALCIUM 8.3* 7.7* 7.4*   Sepsis Markers  Recent Labs Lab 10/14/13 1905 10/15/13 0120 10/16/13 0400  LATICACIDVEN 2.97*  --   --   PROCALCITON  --  2.19 2.51   ABG  Recent Labs Lab 10/15/13 0452 10/15/13 1604 10/15/13 2119  PHART 7.196* 7.257* 7.335*  PCO2ART 67.1* 51.2* 42.0  PO2ART 312.0* 92.0 76.0*   Liver Enzymes  Recent Labs Lab 10/14/13 1753 10/16/13 0409  AST 33 18  ALT 24 14  ALKPHOS 160* 128*  BILITOT 0.5 <0.2*  ALBUMIN 2.2* 1.5*   Cardiac Enzymes  Recent Labs Lab 10/15/13 0442  TROPONINI 1.02*   Glucose  Recent Labs Lab 10/15/13 0831 10/15/13 1200 10/15/13 1535 10/15/13 1908 10/15/13 2343 10/16/13 0419  GLUCAP 106* 91 73 75 72 106*   CXR 1/29 >>> Hardware in good position, bilateral airspace disease L>R.  ASSESSMENT / PLAN:  PULMONARY A:  acute hypoxic respiratory failure likely secondary to pneumonia, r/o superinfection post CAP vs flu ARDS Is there a component of subacute ILD? Boop? , auto immune underlying? P:   ARDS protocol pcxr in am  abg now, assess mv needs Wean today cpap 5 ps 5, x 1 hr, abg assessment To goal plat 25-30 See ID autoimmune work up in progress, hold empiric steroids for lung pcxr in am   CARDIOVASCULAR A:  Hypotensive secondary to septic shock Elevated troponin Mild PA pressures noted, echo unchanged from previous P:  Levophed drip to map goals Sepsis protocol completed CVP monitoring noted, echo with reasonable normal RV, bolus Dc al lasix Stress roids If declines BP, add vaso  RENAL A:   Hematuria likely secondary to nephrolithiasis, no hydronephrosis Hypokalemia  Repeat U/A 0-2 WBC, 3-6 RBC - not c/w active sed P:   BMP in AM 1/2  NS Bolus saline NO LASIX  GASTROINTESTINAL A:  Abdominal pain unclear etiology, from resp?, pt on bid Protonix home regimen, denies h/o ulcer, lipase negative, amylase neg, AST/ALT/ Bili wnl nonspecific findings on Abdominal U/S.  P:   Protonix  TF- oxepa May need CT, not required as of now  HEMATOLOGIC A:  Anemia of chronic disease Leukocytosis improved DVT ppx P:  Hgb stable, cont to monitor Lovenox, crt in am   INFECTIOUS A:   HCAP, vs flu (neg pcr prior) and super infection Severe Sepsis  Pharyngitis -strep swab neg HIV neg PCT unimpressive P:   Cultures as above Cont Vanc, Zosyn, re evaluation to narrow in am  Unlikely atypical, dc Azithromycin Cont Tamiflu until flu bronch neg WILL CALL  LAB TO INVESTIGATE WHY NO SAMPLE flu Sending auto immune work up- on going, no role empiric steroids  ENDOCRINE A:  rel AI (cortisol 11.1) TSH 0.226, r/o sick euthyroid P:   Cont to monitor Solucortef 50 q6 Assess t3, t4  NEUROLOGIC A:   Seizure disorder, question of reported seizure  Fibromyalgia Chronic pain P:   Cont home regimen Tri-leptal Cont Lyrica  Sedation with Fentanyl, Versed Eeg For WUA  Carlynn PurlErik Hoffman, DO Internal Medicine Resident PGY1 10/16/2013, 7:01 AM  Ccm time 30 min   I have fully examined this patient and agree with above findings.    And edited in full  Mcarthur RossettiDaniel J. Tyson AliasFeinstein, MD, FACP Pgr: [phone]  [facility]ulmonary [facility] Critical Care

## 2013-10-16 NOTE — Progress Notes (Signed)
EEG completed at bedside 

## 2013-10-16 NOTE — Progress Notes (Signed)
NUTRITION FOLLOW UP  Intervention:    Continue TF via OGT with Oxepa decrease goal rate to 30 ml/h with Prostat 30 ml BID to provide 1280 kcals, 75 gm protein, 565 ml free water daily.  Nutrition Dx:  Inadequate oral intake related to inability to eat as evidenced by NPO status. Ongoing.  Goal:   Intake to meet >90% of estimated nutrition needs.  Monitor:   TF tolerance/adequacy, weight trend, labs, vent status.  Assessment:   Pt admitted with fever and respiratory distress in setting of PNA, possible seizure, and pre-syncope. Pt with recent admission for CAP.    Discussed patient in ICU rounds today. Currently on ARDS protocol. Oxepa TF has been infusing since last night; patient tolerating well.   Patient is currently intubated on ventilator support.  MV: 13.4 L/min Temp (24hrs), Avg:98.7 F (37.1 C), Min:97.8 F (36.6 C), Max:99.6 F (37.6 C)   Height: Ht Readings from Last 1 Encounters:  10/15/13 5\' 1"  (1.549 m)    Weight Status:   Wt Readings from Last 1 Encounters:  10/16/13 133 lb 13.1 oz (60.7 kg)  10/15/13  129 lb 3 oz (58.6 kg)   Re-estimated needs:  Kcal: 1280 Protein: 75-90 gm Fluid: 1.3-1.5 L  Skin: no wounds  Diet Order:  NPO   Intake/Output Summary (Last 24 hours) at 10/16/13 1139 Last data filed at 10/16/13 1109  Gross per 24 hour  Intake 4762.3 ml  Output   2300 ml  Net 2462.3 ml    Last BM: PTA   Labs:   Recent Labs Lab 10/14/13 1753 10/15/13 0442 10/16/13 0409  NA 143 145 141  K 3.5* 3.7 3.3*  CL 104 111 109  CO2 23 23 21   BUN 9 6 7   CREATININE 0.74 0.62 0.64  CALCIUM 8.3* 7.7* 7.4*  MG  --   --  1.9  GLUCOSE 117* 118* 115*    CBG (last 3)   Recent Labs  10/15/13 2343 10/16/13 0419 10/16/13 0736  GLUCAP 72 106* 111*    Scheduled Meds: . antiseptic oral rinse  15 mL Mouth Rinse QID  . chlorhexidine  15 mL Mouth Rinse BID  . enoxaparin (LOVENOX) injection  40 mg Subcutaneous Q24H  . feeding supplement (OXEPA)   1,000 mL Per Tube Q24H  . folic acid  1 mg Per Tube Daily  . hydrocortisone sod succinate (SOLU-CORTEF) inj  50 mg Intravenous Q6H  . oseltamivir  75 mg Oral BID  . Oxcarbazepine  300 mg Per Tube BID  . pantoprazole sodium  40 mg Per Tube Daily  . piperacillin-tazobactam (ZOSYN)  IV  3.375 g Intravenous Q8H  . pregabalin  300 mg Oral BID  . sodium chloride  1,000 mL Intravenous Once  . vancomycin  750 mg Intravenous Q12H    Continuous Infusions: . sodium chloride 125 mL/hr at 10/15/13 1812  . fentaNYL infusion INTRAVENOUS 100 mcg/hr (10/16/13 1027)  . midazolam (VERSED) infusion 2 mg/hr (10/16/13 1049)  . norepinephrine (LEVOPHED) Adult infusion Stopped (10/16/13 1051)    Joaquin CourtsKimberly Harris, RD, LDN, CNSC Pager [phone] After Hours Pager [phone]

## 2013-10-17 ENCOUNTER — Inpatient Hospital Stay (HOSPITAL_COMMUNITY): Payer: Medicaid Other

## 2013-10-17 LAB — CULTURE, RESPIRATORY: CULTURE: NO GROWTH

## 2013-10-17 LAB — GLUCOSE, CAPILLARY
GLUCOSE-CAPILLARY: 161 mg/dL — AB (ref 70–99)
GLUCOSE-CAPILLARY: 166 mg/dL — AB (ref 70–99)
Glucose-Capillary: 129 mg/dL — ABNORMAL HIGH (ref 70–99)
Glucose-Capillary: 153 mg/dL — ABNORMAL HIGH (ref 70–99)
Glucose-Capillary: 167 mg/dL — ABNORMAL HIGH (ref 70–99)
Glucose-Capillary: 195 mg/dL — ABNORMAL HIGH (ref 70–99)

## 2013-10-17 LAB — RESPIRATORY VIRUS PANEL
ADENOVIRUS: NOT DETECTED
INFLUENZA A H3: NOT DETECTED
INFLUENZA A: NOT DETECTED
INFLUENZA B 1: NOT DETECTED
Influenza A H1: NOT DETECTED
Metapneumovirus: NOT DETECTED
Parainfluenza 1: NOT DETECTED
Parainfluenza 2: NOT DETECTED
Parainfluenza 3: NOT DETECTED
RESPIRATORY SYNCYTIAL VIRUS B: NOT DETECTED
Respiratory Syncytial Virus A: NOT DETECTED
Rhinovirus: NOT DETECTED

## 2013-10-17 LAB — POCT I-STAT 3, ART BLOOD GAS (G3+)
ACID-BASE DEFICIT: 1 mmol/L (ref 0.0–2.0)
Acid-Base Excess: 2 mmol/L (ref 0.0–2.0)
BICARBONATE: 25.7 meq/L — AB (ref 20.0–24.0)
Bicarbonate: 25.7 mEq/L — ABNORMAL HIGH (ref 20.0–24.0)
O2 SAT: 99 %
O2 SAT: 98 %
PCO2 ART: 35.7 mmHg (ref 35.0–45.0)
TCO2: 27 mmol/L (ref 0–100)
TCO2: 27 mmol/L (ref 0–100)
pCO2 arterial: 49 mmHg — ABNORMAL HIGH (ref 35.0–45.0)
pH, Arterial: 7.464 — ABNORMAL HIGH (ref 7.350–7.450)
pH, Arterial: 7.324 — ABNORMAL LOW (ref 7.350–7.450)
pO2, Arterial: 144 mmHg — ABNORMAL HIGH (ref 80.0–100.0)
pO2, Arterial: 105 mmHg — ABNORMAL HIGH (ref 80.0–100.0)

## 2013-10-17 LAB — BASIC METABOLIC PANEL
BUN: 12 mg/dL (ref 6–23)
BUN: 13 mg/dL (ref 6–23)
CO2: 22 meq/L (ref 19–32)
CO2: 25 meq/L (ref 19–32)
CREATININE: 0.59 mg/dL (ref 0.50–1.10)
Calcium: 8 mg/dL — ABNORMAL LOW (ref 8.4–10.5)
Calcium: 8.1 mg/dL — ABNORMAL LOW (ref 8.4–10.5)
Chloride: 107 mEq/L (ref 96–112)
Chloride: 105 mEq/L (ref 96–112)
Creatinine, Ser: 0.53 mg/dL (ref 0.50–1.10)
GFR calc Af Amer: >90 mL/min (ref >90)
GFR calc Af Amer: >90 mL/min (ref >90)
GFR calc non Af Amer: >90 mL/min (ref >90)
GFR calc non Af Amer: >90 mL/min (ref >90)
Glucose, Bld: 158 mg/dL — ABNORMAL HIGH (ref 70–99)
Glucose, Bld: 182 mg/dL — ABNORMAL HIGH (ref 70–99)
POTASSIUM: 3.7 meq/L (ref 3.7–5.3)
Potassium: 3.5 mEq/L — ABNORMAL LOW (ref 3.7–5.3)
Sodium: 143 mEq/L (ref 137–147)
Sodium: 139 mEq/L (ref 137–147)

## 2013-10-17 LAB — CBC
HCT: 26.3 % — ABNORMAL LOW (ref 36.0–46.0)
Hemoglobin: 8.3 g/dL — ABNORMAL LOW (ref 12.0–15.0)
MCH: 27.7 pg (ref 26.0–34.0)
MCHC: 31.6 g/dL (ref 30.0–36.0)
MCV: 87.7 fL (ref 78.0–100.0)
Platelets: 446 10*3/uL — ABNORMAL HIGH (ref 150–400)
RBC: 3 MIL/uL — ABNORMAL LOW (ref 3.87–5.11)
RDW: 15.7 % — AB (ref 11.5–15.5)
WBC: 15.7 10*3/uL — ABNORMAL HIGH (ref 4.0–10.5)

## 2013-10-17 LAB — CULTURE, RESPIRATORY W GRAM STAIN

## 2013-10-17 LAB — TROPONIN I: Troponin I: <0.3 ng/mL (ref <0.30)

## 2013-10-17 MED ORDER — VASOPRESSIN 20 UNIT/ML IJ SOLN
0.0300 [IU]/min | INTRAVENOUS | Status: DC
  Administered 2013-10-17: 0.03 [IU]/min via INTRAVENOUS
  Filled 2013-10-17 (×2): qty 2.5

## 2013-10-17 MED ORDER — DOPAMINE-DEXTROSE 3.2-5 MG/ML-% IV SOLN
5.0000 ug/kg/min | INTRAVENOUS | Status: DC
  Administered 2013-10-17: 5 ug/kg/min via INTRAVENOUS

## 2013-10-17 MED ORDER — ATROPINE SULFATE 0.1 MG/ML IJ SOLN
INTRAMUSCULAR | Status: AC
  Filled 2013-10-17: qty 10

## 2013-10-17 MED ORDER — DOPAMINE-DEXTROSE 3.2-5 MG/ML-% IV SOLN
INTRAVENOUS | Status: AC
  Administered 2013-10-17: 5 ug/kg/min via INTRAVENOUS
  Filled 2013-10-17: qty 250

## 2013-10-17 MED ORDER — INSULIN ASPART 100 UNIT/ML ~~LOC~~ SOLN
0.0000 [IU] | SUBCUTANEOUS | Status: DC
  Administered 2013-10-17 (×3): 2 [IU] via SUBCUTANEOUS
  Administered 2013-10-18: 1 [IU] via SUBCUTANEOUS
  Administered 2013-10-18: 2 [IU] via SUBCUTANEOUS
  Administered 2013-10-18: 1 [IU] via SUBCUTANEOUS
  Administered 2013-10-18 (×2): 2 [IU] via SUBCUTANEOUS
  Administered 2013-10-19 – 2013-10-26 (×8): 1 [IU] via SUBCUTANEOUS

## 2013-10-17 MED ORDER — DEXTROSE 5 % IV SOLN
1.0000 g | INTRAVENOUS | Status: AC
  Administered 2013-10-17 – 2013-10-21 (×5): 1 g via INTRAVENOUS
  Filled 2013-10-17 (×5): qty 10

## 2013-10-17 MED ORDER — METHYLPREDNISOLONE SODIUM SUCC 125 MG IJ SOLR
80.0000 mg | Freq: Four times a day (QID) | INTRAMUSCULAR | Status: DC
  Administered 2013-10-17 – 2013-10-19 (×8): 80 mg via INTRAVENOUS
  Filled 2013-10-17 (×12): qty 1.28

## 2013-10-17 NOTE — Progress Notes (Signed)
Persistent bradycardia, since 1/29 1700 and continues all day today, heart rate getting as low as 43. Initial plan was to start dopamine per doctor Tyson AliasFeinstein however after 3 hours of dopamine, no improvement in heart rate. Discussed with doctor Tyson AliasFeinstein. Per doctor Tyson AliasFeinstein turn dopamine off and try titrating sedation down.

## 2013-10-17 NOTE — Progress Notes (Signed)
PULMONOLOGY/CRITICAL CARE   Name: [name] [name] MRN: [id number] DOB: 23-Jul-1961    ADMISSION DATE:  10/14/2013 CONSULTATION DATE: 10/14/13  REFERRING MD :  ED PRIMARY SERVICE: PCCM  CHIEF COMPLAINT:  fever  BRIEF PATIENT DESCRIPTION: 53 yo female with recent admission for CAP re-presents with fever, leukocytosis, chest pain, and hypoxia in setting of possible seizure vs pre-syncope.  SIGNIFICANT EVENTS / STUDIES:  1/28 Abd [place] >>No hydronephrosis, Left kidney 6m stone?, Right kidney 687mstone? Mild intra and extrahepatic biliary ductal prominence,  1/28 CTA chest >>> No PE.  Bilateral prominently upper lobe mixed airspace and ground-glass opacities, multi lobar pneumonia is favored.  Mediastinal and bilateral hilar adenopathy  1/28 Bronchoscopy >>> unimpressive secretions, no DAH, no hsv lesions, no mass lesions, BAL sent. 1/28 TTE >>> EF 65-70%, grade 1 DD, PA peak 38, no change from prior 1/28 CT head >>> Negative for acute process. 1/29 EEG >>> no evidence of seizure activity  LINES / TUBES: 1/28 LIJ >>> 1/28 OETT >>> 1/28 Foley >> 1/29 A line >>>  CULTURES: 1/18 Blood cultures >> Neg 1/27 UCx >>>> Neg 1/27 BCx >>>> NGTD 1/28 Strep culture >>> 1/28 MRSA >>> neg 1/28 Pneumocystis smear (BAL) >>> neg 1/28 AFB (BAL) >>>> smear neg, culture pending 1/28 Resp Culture (BAL) >>> 1/28 Fungus Culture (BAL) >>> No yeast or fungal elements seen, culture in process 1/28 GAS (BAL) >>>> 1/28 Respiratory virus panel (BAL) >>> HIV neg  AUTOIMMUNE: Collected 1/28  p-ANCA>>>pos  c-ANCA >>> neg  Atypical p-ANCA>>>neg  Myeloperoxidase Abx >>> 1.2 (pos) Serine Protease 3 >>> <1.0 (neg) ESR >>> 98 ANA >>> pos C3 >>> 108 C4 >>> 22 RF >>> 13 Total Complement >>> dsDNA Ab >>> 1 CRP >>> 23.8  Cryoglobulin >>>  ANTIBIOTICS: Home on levopfloxacin 1/27  Vanc >>>1/30 1/27 Zosyn >>>1/30 1/30 ceftriaxone>>>2/3 plan stop 1/27 Azithromycin >>>1/29  VITAL SIGNS: Temp:  [97.6 F (36.4  C)-99.6 F (37.6 C)] 98.2 F (36.8 C) (01/30 0405) Pulse Rate:  [46-116] 48 (01/30 0500) Resp:  [19-37] 26 (01/30 0500) BP: (83-141)/(46-75) 130/67 mmHg (01/30 0500) SpO2:  [95 %-100 %] 100 % (01/30 0500) Arterial Line BP: (83-143)/(53-112) 97/65 mmHg (01/29 1900) FiO2 (%):  [30 %-40 %] 40 % (01/30 0319)  CVP:  [4 mmHg] 4 mmHg INTAKE / OUTPUT: Intake/Output     01/29 0701 - 01/30 0700   I.V. (mL/kg) 3478.1 (57.3)   NG/GT 776   IV Piggyback 2000   Total Intake(mL/kg) 6254.1 (103)   Urine (mL/kg/hr) 1360 (0.9)   Total Output 1360   Net +4894.1        SUBJECTIVE: Mild bradycardia overnight, still on Levophed drip. Afebrile  PHYSICAL EXAMINATION: General: Sedated, OETT in place HEENT:[place], OETT in place Cardiac: bradycardic, no rubs, murmurs or gallops Pulm: mechanical breath sounds, no wheezing Abd: soft, nontender, nondistended, hypoactive bowel sounds Ext: warm and well perfused, 2+ pulses in all 4 extremities Neuro: sedated RASS -2   LABS:  CBC  Recent Labs Lab 10/15/13 0120 10/15/13 0442 10/16/13 0409  WBC 49.1* 46.6* 24.9*  HGB 10.7* 9.6* 8.6*  HCT 33.5* 30.2* 27.4*  PLT 422* 391 386   BMET  Recent Labs Lab 10/14/13 1753 10/15/13 0442 10/16/13 0409  NA 143 145 141  K 3.5* 3.7 3.3*  CL 104 111 109  CO2 '23 23 21  ' BUN '9 6 7  ' CREATININE 0.74 0.62 0.64  GLUCOSE 117* 118* 115*   Electrolytes  Recent Labs Lab 10/14/13 1753 10/15/13 0442 10/16/13 0409  CALCIUM 8.3* 7.7* 7.4*  MG  --   --  1.9   Sepsis Markers  Recent Labs Lab 10/14/13 1905 10/15/13 0120 10/16/13 0400  LATICACIDVEN 2.97*  --   --   PROCALCITON  --  2.19 2.51   ABG  Recent Labs Lab 10/15/13 1604 10/15/13 2119 10/16/13 0909  PHART 7.257* 7.335* 7.304*  PCO2ART 51.2* 42.0 48.3*  PO2ART 92.0 76.0* 68.0*   Liver Enzymes  Recent Labs Lab 10/14/13 1753 10/16/13 0409  AST 33 18  ALT 24 14  ALKPHOS 160* 128*  BILITOT 0.5 <0.2*  ALBUMIN 2.2* 1.5*   Cardiac  Enzymes  Recent Labs Lab 10/15/13 0442  TROPONINI 1.02*   Glucose  Recent Labs Lab 10/16/13 0736 10/16/13 1218 10/16/13 1602 10/16/13 1916 10/17/13 0002 10/17/13 0404  GLUCAP 111* 126* 162* 155* 166* 161*   CXR 1/30 >>> Hardware in good position, improved aeration of left lung.  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?, autoimmune pneumonitis) (no biopsy) R/o pulm renal syndrome (had rbc in urine) NO DAH P:   ARDS protocol  Wean today cpap 5 ps 5, x 1 hr, abg assessment - acidotic, rise RR To goal plat 25-30 See ID Commence Tx for possible vasculitis contribution with int highish dose steroids, solumedrol 80 q6h, concern to avoid pulses steroids is for admission toxic and PMN, fever clearly on admission and NO Evidence DAH pcxr in am  Likely to require open lung BX with such borderline titers, MP and p ANCA  CARDIOVASCULAR A:  Hypotensive secondary to septic shock Mild PA pressures noted, echo unchanged from previous 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  P:   Protonix  TF- oxepa  HEMATOLOGIC A:  Anemia of chronic disease Leukocytosis improved DVT ppx P:  Hgb stable, cont to monitor Lovenox, crt in am   INFECTIOUS A:   HCAP, vs flu (neg pcr prior) and super infection Severe Sepsis  Pharyngitis -strep swab neg HIV neg P:   Cultures as above Dc Vanc, Zosyn, add ceftraixone Cont  Tamiflu until flu bronch neg  ENDOCRINE A:   rel AI (cortisol 11.1) Likely sick euthyroid TSH 0.226, T3 39.4, free T4 0.69 P:   Cont to monitor Roids, see pulm unlikely neuro related trh suppression; c.inical circumstance c/we sick euthyroid  NEUROLOGIC A:   Seizure disorder, question of reported seizure no evidence on EEG Fibromyalgia- has this been vasculitis over time? Chronic pain P:   Cont home regimen Tri-leptal Cont Lyrica  Sedation with Fentanyl, Versed For WUA  Joni Reining, DO Internal Medicine Resident PGY1 10/17/2013, 6:15 AM  I have fully examined this patient and agree with above findings.    And edited in full  Ccm time 35 min   Lavon Paganini. Titus Mould, MD, [place] Pgr: [facility]

## 2013-10-18 LAB — CULTURE, GROUP A STREP

## 2013-10-18 LAB — GLUCOSE, CAPILLARY
GLUCOSE-CAPILLARY: 177 mg/dL — AB (ref 70–99)
Glucose-Capillary: 104 mg/dL — ABNORMAL HIGH (ref 70–99)
Glucose-Capillary: 124 mg/dL — ABNORMAL HIGH (ref 70–99)
Glucose-Capillary: 131 mg/dL — ABNORMAL HIGH (ref 70–99)
Glucose-Capillary: 173 mg/dL — ABNORMAL HIGH (ref 70–99)
Glucose-Capillary: 184 mg/dL — ABNORMAL HIGH (ref 70–99)

## 2013-10-18 LAB — COMPLEMENT, TOTAL: Compl, Total (CH50): 56 U/mL (ref 31–60)

## 2013-10-18 MED ORDER — POTASSIUM CHLORIDE 20 MEQ/15ML (10%) PO LIQD
20.0000 meq | ORAL | Status: AC
  Administered 2013-10-18: 20 meq
  Filled 2013-10-18: qty 15

## 2013-10-18 NOTE — Progress Notes (Signed)
Droplet precaution dcd. Negative flu virus.

## 2013-10-18 NOTE — Progress Notes (Signed)
PULMONOLOGY/CRITICAL CARE   Name: [name] [name] MRN: [id number] DOB: [birth year]    ADMISSION DATE:  10/14/2013 CONSULTATION DATE: 10/14/13  REFERRING MD :  ED PRIMARY SERVICE: PCCM  CHIEF COMPLAINT:  fever  BRIEF PATIENT DESCRIPTION: 53 yo female with recent admission for CAP re-presents with fever, leukocytosis, chest pain, and hypoxia in setting of possible seizure vs pre-syncope.  SIGNIFICANT EVENTS / STUDIES:  1/28 Abd [place] >>No hydronephrosis, Left kidney 72m stone?, Right kidney 664mstone? Mild intra and extrahepatic biliary ductal prominence,  1/28 CTA chest >>> No PE.  Bilateral prominently upper lobe mixed airspace and ground-glass opacities, multi lobar pneumonia is favored.  Mediastinal and bilateral hilar adenopathy  1/28 Bronchoscopy >>> unimpressive secretions, no DAH, no hsv lesions, no mass lesions, BAL sent. 1/28 TTE >>> EF 65-70%, grade 1 DD, PA peak 38, no change from prior 1/28 CT head >>> Negative for acute process. 1/29 EEG >>> no evidence of seizure activity  LINES / TUBES: 1/28 LIJ >>> 1/28 OETT >>> 1/28 Foley >> 1/29 A line >>>out  CULTURES: 1/18 Blood cultures >> Neg 1/27 UCx >>>> Neg 1/27 BCx >>>> NGTD 1/28 Strep culture >>>neg 1/28 MRSA >>> neg 1/28 Pneumocystis smear (BAL) >>> neg 1/28 AFB (BAL) >>>> smear neg, culture pending 1/28 Resp Culture (BAL) >>> 1/28 Fungus Culture (BAL) >>> No yeast or fungal elements seen, culture in process 1/28 GAS (BAL) >>>> 1/28 Respiratory virus panel (BAL) >>>neg HIV neg  AUTOIMMUNE: Collected 1/28  p-ANCA>>>pos  c-ANCA >>> neg  Atypical p-ANCA>>>neg  Myeloperoxidase Abx >>> 1.2 (pos) Serine Protease 3 >>> <1.0 (neg) ESR >>> 98 ANA >>> pos C3 >>> 108 C4 >>> 22 RF >>> 13 Total Complement >>> dsDNA Ab >>> 1 CRP >>> 23.8  Cryoglobulin >>>  ANTIBIOTICS: Home on levofloxacin 1/27  Vanc >>>1/30 1/27 Zosyn >>>1/30 1/30 ceftriaxone>>>2/3 plan stop 1/27 Azithromycin >>>1/29 1/28 tamiflu stopped  1/31  VITAL SIGNS: Temp:  [97.3 F (36.3 C)-98.4 F (36.9 C)] 97.3 F (36.3 C) (01/31 0731) Pulse Rate:  [40-78] 58 (01/31 0900) Resp:  [11-35] 22 (01/31 0900) BP: (103-160)/(43-109) 148/75 mmHg (01/31 0900) SpO2:  [100 %] 100 % (01/31 0900) FiO2 (%):  [40 %] 40 % (01/31 0835)    INTAKE / OUTPUT: Intake/Output     01/30 0701 - 01/31 0700 01/31 0701 - 02/01 0700   I.V. (mL/kg) 3846.7 (63.4) 250 (4.1)   NG/GT 693 60   IV Piggyback 50    Total Intake(mL/kg) 4589.7 (75.6) 310 (5.1)   Urine (mL/kg/hr) 1365 (0.9) 900 (6.4)   Total Output 1365 900   Net +3224.7 -590         SUBJECTIVE: Off pressors. Agitated on sbt and required restraints.  PHYSICAL EXAMINATION: General: Agitated, OETT in place HEENT:[place], OETT in place, drooling Cardiac: HSR, no rubs, murmurs or gallops Pulm: mechanical breath sounds, no wheezing Abd: soft, nontender, nondistended, hypoactive bowel sounds Ext: warm and well perfused, 2+ pulses in all 4 extremities Neuro: MAE x 4 agitated   LABS:  CBC  Recent Labs Lab 10/15/13 0442 10/16/13 0409 10/17/13 0500  WBC 46.6* 24.9* 15.7*  HGB 9.6* 8.6* 8.3*  HCT 30.2* 27.4* 26.3*  PLT 391 386 446*   BMET  Recent Labs Lab 10/16/13 0409 10/17/13 0500 10/17/13 1517  NA 141 143 139  K 3.3* 3.7 3.5*  CL 109 107 105  CO2 '21 22 25  ' BUN '7 12 13  ' CREATININE 0.64 0.59 0.53  GLUCOSE 115* 158* 182*   Electrolytes  Recent  Labs Lab 10/16/13 0409 10/17/13 0500 10/17/13 1517  CALCIUM 7.4* 8.0* 8.1*  MG 1.9  --   --    Sepsis Markers  Recent Labs Lab 10/14/13 1905 10/15/13 0120 10/16/13 0400  LATICACIDVEN 2.97*  --   --   PROCALCITON  --  2.19 2.51   ABG  Recent Labs Lab 10/16/13 0909 10/17/13 0731 10/17/13 0952  PHART 7.304* 7.464* 7.324*  PCO2ART 48.3* 35.7 49.0*  PO2ART 68.0* 144.0* 105.0*   Liver Enzymes  Recent Labs Lab 10/14/13 1753 10/16/13 0409  AST 33 18  ALT 24 14  ALKPHOS 160* 128*  BILITOT 0.5 <0.2*  ALBUMIN 2.2*  1.5*   Cardiac Enzymes  Recent Labs Lab 10/15/13 0442 10/17/13 1517  TROPONINI 1.02* <0.30   Glucose  Recent Labs Lab 10/17/13 1157 10/17/13 1526 10/17/13 1902 10/17/13 2356 10/18/13 0402 10/18/13 0705  GLUCAP 153* 167* 195* 184* 173* 177*   Dg Chest Port 1 View  10/17/2013   CLINICAL DATA:  Evaluate endotracheal tube.  EXAM: PORTABLE CHEST - 1 VIEW  COMPARISON:  DG CHEST 1V PORT dated 10/16/2013; CT ANGIO CHEST W/CM &/OR WO/CM dated 10/15/2013  FINDINGS: Endotracheal tube 3 cm from the carina. Left IJ central line remains present. Enteric tube is present with the redundant coital in the stomach. Improving bilateral upper lobe predominant airspace disease, now roughly symmetric. No effusion is identified. Monitoring leads project over the chest.  IMPRESSION: 1. Endotracheal tube 3 cm from the carina. Other support apparatus appear similar. 2. Improving bilateral upper lobe airspace disease, and now roughly symmetric.   Electronically Signed   By: Dereck Ligas M.D.   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?, autoimmune pneumonitis) (no biopsy) R/o pulm renal syndrome (had rbc in urine) NO DAH P:   ARDS protocol  Wean today cpap 5 ps 5, as tolerated See ID Commence Tx for possible vasculitis contribution with int highish dose steroids, solumedrol 80 q6h, concern to avoid pulses steroids is for admission toxic and PMN, fever clearly on admission and No Evidence DAH pcxr in am  Likely to require open lung BX with such borderline titers, MP and p ANCA  CARDIOVASCULAR A:  Hypotensive secondary to septic shock(resolved 1/31 off pressors) Mild PA pressures noted, echo unchanged from previous 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 P:  Pressors as needed CVP pending Stress steroids dc with addition of solumedrol   RENAL Lab  Results  Component Value Date   CREATININE 0.53 10/17/2013   CREATININE 0.59 10/17/2013   CREATININE 0.64 10/16/2013    A:   Hematuria may be secondary to pulm renal syndrome Hypokalemia resolved P:   BMP as needed 1/2 NS @ 125cc/hr No lasix  GASTROINTESTINAL A:  Abdominal pain unclear etiology, from resp?, pt on bid Protonix home regimen, denies h/o ulcer, lipase negative, amylase neg, AST/ALT/ Bili wnl nonspecific findings on Abdominal U/S.  P:   Protonix  TF- oxepa  HEMATOLOGIC  Recent Labs  10/16/13 0409 10/17/13 0500  HGB 8.6* 8.3*    A:  Anemia of chronic disease Leukocytosis improved DVT ppx P:  Hgb stable, cont to monitor Lovenox, follow creatine  INFECTIOUS A:   HCAP, vs flu (neg pcr prior) and super infection Severe Sepsis  Pharyngitis -strep swab neg HIV neg P:   Cultures as above Dc Vanc, Zosyn, add ceftraixone DC Tamiflu neg viral panel  ENDOCRINE A:   rel AI (cortisol 11.1) Likely sick euthyroid TSH 0.226, T3 39.4, free T4 0.69 P:   Cont to monitor Sreroids, see pulm unlikely neuro related trh suppression; clinical circumstance c/w sick euthyroid  NEUROLOGIC A:   Seizure disorder, question of reported seizure no evidence on EEG Fibromyalgia- has this been vasculitis over time? Chronic pain P:   Cont home regimen Tri-leptal Cont Lyrica  Sedation with Fentanyl, Versed For Phylliss Blakes Minor ACNP Maryanna Shape PCCM Pager [phone] till 3 pm If no answer page [phone] 10/18/2013, 9:32 AM  Begin weaning trials, will attempt extubation when mental status improves.  SBT in AM, minimize sedation.  CC time 35 min.  Patient seen and examined, agree with above note.  I dictated the care and orders written for this patient under my direction.  Rush Farmer, MD [phone]

## 2013-10-19 ENCOUNTER — Inpatient Hospital Stay (HOSPITAL_COMMUNITY): Payer: Medicaid Other

## 2013-10-19 LAB — GLUCOSE, CAPILLARY
GLUCOSE-CAPILLARY: 106 mg/dL — AB (ref 70–99)
GLUCOSE-CAPILLARY: 121 mg/dL — AB (ref 70–99)
GLUCOSE-CAPILLARY: 94 mg/dL (ref 70–99)
GLUCOSE-CAPILLARY: 95 mg/dL (ref 70–99)
Glucose-Capillary: 105 mg/dL — ABNORMAL HIGH (ref 70–99)
Glucose-Capillary: 110 mg/dL — ABNORMAL HIGH (ref 70–99)

## 2013-10-19 LAB — BASIC METABOLIC PANEL
BUN: 17 mg/dL (ref 6–23)
CALCIUM: 8 mg/dL — AB (ref 8.4–10.5)
CO2: 29 mEq/L (ref 19–32)
Chloride: 109 mEq/L (ref 96–112)
Creatinine, Ser: 0.53 mg/dL (ref 0.50–1.10)
GFR calc non Af Amer: >90 mL/min (ref >90)
Glucose, Bld: 124 mg/dL — ABNORMAL HIGH (ref 70–99)
POTASSIUM: 3.3 meq/L — AB (ref 3.7–5.3)
SODIUM: 146 meq/L (ref 137–147)

## 2013-10-19 LAB — CBC
HCT: 25.2 % — ABNORMAL LOW (ref 36.0–46.0)
HEMOGLOBIN: 7.9 g/dL — AB (ref 12.0–15.0)
MCH: 27.6 pg (ref 26.0–34.0)
MCHC: 31.3 g/dL (ref 30.0–36.0)
MCV: 88.1 fL (ref 78.0–100.0)
PLATELETS: 373 10*3/uL (ref 150–400)
RBC: 2.86 MIL/uL — AB (ref 3.87–5.11)
RDW: 15.8 % — ABNORMAL HIGH (ref 11.5–15.5)
WBC: 8.5 10*3/uL (ref 4.0–10.5)

## 2013-10-19 LAB — PHOSPHORUS: Phosphorus: 2.1 mg/dL — ABNORMAL LOW (ref 2.3–4.6)

## 2013-10-19 LAB — MAGNESIUM: MAGNESIUM: 2.1 mg/dL (ref 1.5–2.5)

## 2013-10-19 MED ORDER — POTASSIUM PHOSPHATE DIBASIC 3 MMOLE/ML IV SOLN
10.0000 mmol | Freq: Once | INTRAVENOUS | Status: AC
  Administered 2013-10-19: 10 mmol via INTRAVENOUS
  Filled 2013-10-19: qty 3.33

## 2013-10-19 MED ORDER — METHYLPREDNISOLONE SODIUM SUCC 40 MG IJ SOLR
40.0000 mg | Freq: Four times a day (QID) | INTRAMUSCULAR | Status: DC
  Administered 2013-10-19 – 2013-10-20 (×5): 40 mg via INTRAVENOUS
  Filled 2013-10-19 (×8): qty 1

## 2013-10-19 MED ORDER — POTASSIUM CHLORIDE 10 MEQ/50ML IV SOLN
10.0000 meq | INTRAVENOUS | Status: AC
  Administered 2013-10-19 (×4): 10 meq via INTRAVENOUS
  Filled 2013-10-19: qty 50

## 2013-10-19 MED ORDER — DEXMEDETOMIDINE HCL IN NACL 200 MCG/50ML IV SOLN
0.0000 ug/kg/h | INTRAVENOUS | Status: DC
  Administered 2013-10-19: 0.1 ug/kg/h via INTRAVENOUS
  Administered 2013-10-19: 0.8 ug/kg/h via INTRAVENOUS
  Administered 2013-10-20 (×2): 1 ug/kg/h via INTRAVENOUS
  Administered 2013-10-20: 1.2 ug/kg/h via INTRAVENOUS
  Administered 2013-10-20: 1 ug/kg/h via INTRAVENOUS
  Administered 2013-10-20: 0.6 ug/kg/h via INTRAVENOUS
  Administered 2013-10-20: 1 ug/kg/h via INTRAVENOUS
  Administered 2013-10-21: 0.7 ug/kg/h via INTRAVENOUS
  Administered 2013-10-21: 0.9 ug/kg/h via INTRAVENOUS
  Administered 2013-10-21: 0.6 ug/kg/h via INTRAVENOUS
  Administered 2013-10-21: 0.3 ug/kg/h via INTRAVENOUS
  Administered 2013-10-22: 0.2 ug/kg/h via INTRAVENOUS
  Filled 2013-10-19 (×15): qty 50

## 2013-10-19 MED ORDER — FENTANYL CITRATE 0.05 MG/ML IJ SOLN
50.0000 ug | INTRAMUSCULAR | Status: DC | PRN
  Administered 2013-10-19: 50 ug via INTRAVENOUS
  Administered 2013-10-19 – 2013-10-21 (×12): 100 ug via INTRAVENOUS
  Administered 2013-10-23: 50 ug via INTRAVENOUS
  Administered 2013-10-24: 100 ug via INTRAVENOUS
  Filled 2013-10-19 (×15): qty 2

## 2013-10-19 NOTE — Progress Notes (Signed)
eLink Physician-Brief Progress Note Patient Name: [name] [name] DOB: 07/12/1961 MRN: [id number]  Date of Service  10/19/2013   HPI/Events of Note   Remains acutely agitated  eICU Interventions  Try low dose precedex drip   Intervention Category Major Interventions: Delirium, psychosis, severe agitation - evaluation and management  [name] [name] 10/19/2013, 4:51 PM

## 2013-10-19 NOTE — Procedures (Signed)
Extubation Procedure Note  Patient Details:   Name: [name] [name] DOB: 04/08/1961 MRN: [id number]   Airway Documentation:     Evaluation  O2 sats: stable throughout Complications: No apparent complications Patient did tolerate procedure well. Bilateral Breath Sounds: Rhonchi;Coarse crackles Suctioning: Oral;Airway Yes  Pt tolerated wean, positive for cuff leak, extubated to 3lpm . No stridor or dyspnea noted after extubation. All vitals are within normal limits. RT will continue to monitor.   Armando GangMike, [place] Rasch C 10/19/2013, 11:55 AM

## 2013-10-19 NOTE — Progress Notes (Signed)
PULMONOLOGY/CRITICAL CARE   Name: [name] [name] MRN: [id number] DOB: 04-08-1961    ADMISSION DATE:  10/14/2013 CONSULTATION DATE: 10/14/13  REFERRING MD :  ED PRIMARY SERVICE: PCCM  CHIEF COMPLAINT:  fever  BRIEF PATIENT DESCRIPTION: 53 yo female with recent admission for CAP re-presents with fever, leukocytosis, chest pain, and hypoxia in setting of possible seizure vs pre-syncope.  SIGNIFICANT EVENTS / STUDIES:  1/28 Abd [place] >>No hydronephrosis, Left kidney 87m stone?, Right kidney 660mstone? Mild intra and extrahepatic biliary ductal prominence,  1/28 CTA chest >>> No PE.  Bilateral prominently upper lobe mixed airspace and ground-glass opacities, multi lobar pneumonia is favored.  Mediastinal and bilateral hilar adenopathy  1/28 Bronchoscopy >>> unimpressive secretions, no DAH, no hsv lesions, no mass lesions, BAL sent. 1/28 TTE >>> EF 65-70%, grade 1 DD, PA peak 38, no change from prior 1/28 CT head >>> Negative for acute process. 1/29 EEG >>> no evidence of seizure activity 1/31 2. 5 hours on ps 5/5  LINES / TUBES: 1/28 LIJ >>> 1/28 OETT >>> 1/28 Foley >> 1/29 A line >>>out  CULTURES: 1/18 Blood cultures >> Neg 1/27 UCx >>>> Neg 1/27 BCx >>>> NGTD 1/28 Strep culture >>>neg 1/28 MRSA >>> neg 1/28 Pneumocystis smear (BAL) >>> neg 1/28 AFB (BAL) >>>> smear neg, culture pending 1/28 Resp Culture (BAL) >>> 1/28 Fungus Culture (BAL) >>> No yeast or fungal elements seen, culture in process 1/28 GAS (BAL) >>>>neg 1/28 Respiratory virus panel (BAL) >>>neg HIV neg  AUTOIMMUNE: Collected 1/28  p-ANCA>>>pos  c-ANCA >>> neg  Atypical p-ANCA>>>neg  Myeloperoxidase Abx >>> 1.2 (pos) Serine Protease 3 >>> <1.0 (neg) ESR >>> 98 ANA >>> pos C3 >>> 108 C4 >>> 22 RF >>> 13 Total Complement >>> dsDNA Ab >>> 1 CRP >>> 23.8  Cryoglobulin >>>  ANTIBIOTICS: Home on levofloxacin 1/27  Vanc >>>1/30 1/27 Zosyn >>>1/30 1/30 ceftriaxone>>>2/3 plan stop 1/27 Azithromycin  >>>1/29 1/28 tamiflu stopped 1/31  VITAL SIGNS: Temp:  [97.6 F (36.4 C)-98.8 F (37.1 C)] 97.8 F (36.6 C) (02/01 0827) Pulse Rate:  [42-111] 73 (02/01 0815) Resp:  [11-36] 22 (02/01 0815) BP: (78-158)/(41-118) 143/77 mmHg (02/01 0800) SpO2:  [99 %-100 %] 100 % (02/01 0815) FiO2 (%):  [40 %] 40 % (02/01 0756) Weight:  [133 lb 13.1 oz (60.7 kg)] 133 lb 13.1 oz (60.7 kg) (02/01 0500)    INTAKE / OUTPUT: Intake/Output     01/31 0701 - 02/01 0700 02/01 0701 - 02/02 0700   P.O. 3    I.V. (mL/kg) 3223.9 (53.1) 132.5 (2.2)   Other 80    NG/GT 780 30   IV Piggyback 50    Total Intake(mL/kg) 4136.9 (68.2) 162.5 (2.7)   Urine (mL/kg/hr) 4025 (2.8)    Total Output 4025     Net +111.9 +162.5         SUBJECTIVE:  Agitated on sbt  PHYSICAL EXAMINATION: General: Agitated, OETT in place HEENT:[place], OETT in place, drooling Cardiac: HSR, no rubs, murmurs or gallops Pulm: mechanical breath sounds, no wheezing Abd: soft, nontender, nondistended, hypoactive bowel sounds Ext: warm and well perfused, 2+ pulses in all 4 extremities Neuro: MAE x 4 agitated or sedated   LABS:  CBC  Recent Labs Lab 10/16/13 0409 10/17/13 0500 10/19/13 0626  WBC 24.9* 15.7* 8.5  HGB 8.6* 8.3* 7.9*  HCT 27.4* 26.3* 25.2*  PLT 386 446* 373   BMET  Recent Labs Lab 10/17/13 0500 10/17/13 1517 10/19/13 0626  NA 143 139 146  K 3.7  3.5* 3.3*  CL 107 105 109  CO2 '22 25 29  ' BUN '12 13 17  ' CREATININE 0.59 0.53 0.53  GLUCOSE 158* 182* 124*   Electrolytes  Recent Labs Lab 10/16/13 0409 10/17/13 0500 10/17/13 1517 10/19/13 0626  CALCIUM 7.4* 8.0* 8.1* 8.0*  MG 1.9  --   --  2.1  PHOS  --   --   --  2.1*   Sepsis Markers  Recent Labs Lab 10/14/13 1905 10/15/13 0120 10/16/13 0400  LATICACIDVEN 2.97*  --   --   PROCALCITON  --  2.19 2.51   ABG  Recent Labs Lab 10/16/13 0909 10/17/13 0731 10/17/13 0952  PHART 7.304* 7.464* 7.324*  PCO2ART 48.3* 35.7 49.0*  PO2ART 68.0* 144.0*  105.0*   Liver Enzymes  Recent Labs Lab 10/14/13 1753 10/16/13 0409  AST 33 18  ALT 24 14  ALKPHOS 160* 128*  BILITOT 0.5 <0.2*  ALBUMIN 2.2* 1.5*   Cardiac Enzymes  Recent Labs Lab 10/15/13 0442 10/17/13 1517  TROPONINI 1.02* <0.30   Glucose  Recent Labs Lab 10/18/13 1129 10/18/13 1611 10/18/13 1939 10/18/13 2340 10/19/13 0341 10/19/13 0713  GLUCAP 104* 131* 124* 121* 105* 110*   Dg Chest Port 1 View  10/19/2013   CLINICAL DATA:  Ventilated patient.  EXAM: PORTABLE CHEST - 1 VIEW  COMPARISON:  10/17/2013  FINDINGS: Cardiac silhouette is normal in size.  Normal mediastinal contours.  Patchy areas of predominantly upper lobe airspace opacity have mildly improved. No new lung opacities. No pleural effusion or pneumothorax.  Endotracheal tube, left internal jugular central venous line and nasogastric tube are stable well-positioned.  IMPRESSION: 1. Improved lung infiltrates. 2. Support apparatus is stable in well positioned.   Electronically Signed   By: Lajean Manes M.D.   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?, autoimmune pneumonitis) (no biopsy) R/o pulm renal syndrome (had rbc in urine) NO DAH P:   ARDS protocol , modified 2/1 with rr decreased to 24 Wean today cpap 5 ps 5, as tolerated, attempt extubation See ID Commence Tx for possible vasculitis contribution with int highish dose steroids, solumedrol 80 q6h, concern to avoid pulses steroids is for admission toxic and PMN, fever clearly on admission and No Evidence DAH pcxr in am  Likely to require open lung BX with such borderline titers, MP and p ANCA  CARDIOVASCULAR A:  Hypotensive secondary to septic shock(resolved 1/31 off pressors) Mild PA pressures noted, echo unchanged from previous 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 P:  Pressors as  needed    RENAL Lab Results  Component Value Date   CREATININE 0.53 10/19/2013   CREATININE 0.53 10/17/2013   CREATININE 0.59 10/17/2013    A:   Hematuria may be secondary to pulm renal syndrome Hypokalemia resolved P:   BMP as needed 1/2 NS @ 75 cc/hr No lasix  GASTROINTESTINAL A:  Abdominal pain unclear etiology, from resp?, pt on bid Protonix home regimen, denies h/o ulcer, lipase negative, amylase neg, AST/ALT/ Bili wnl nonspecific findings on Abdominal U/S.  P:   Protonix  TF- oxepa  HEMATOLOGIC  Recent Labs  10/17/13 0500 10/19/13 0626  HGB 8.3* 7.9*    A:  Anemia of chronic disease Leukocytosis improved DVT ppx P:  Hgb stable, cont to monitor Lovenox, follow creatine  INFECTIOUS A:   HCAP, vs flu (neg pcr prior) and super  infection Severe Sepsis  Pharyngitis -strep swab neg HIV neg P:   Cultures as above Dc Vanc, Zosyn, add ceftraixone DC Tamiflu neg viral panel  ENDOCRINE A:   rel AI (cortisol 11.1) Likely sick euthyroid TSH 0.226, T3 39.4, free T4 0.69 P:   Cont to monitor Sreroids, see pulm unlikely neuro related trh suppression; clinical circumstance c/w sick euthyroid  NEUROLOGIC A:   Seizure disorder, question of reported seizure no evidence on EEG Fibromyalgia- has this been vasculitis over time? Chronic pain P:   Cont home regimen Tri-leptal Cont Lyrica  Sedation with Fentanyl, Versed For Phylliss Blakes Minor ACNP Maryanna Shape PCCM Pager [phone] till 3 pm If no answer page [phone] 10/19/2013, 9:03 AM  Much improvement with PCV, decrease RR for respiratory alkalosis.  Slowly decrease FiO2 and PEEP as tolerated.  Not ready for extubation yet.  CC time 35 min.  Patient seen and examined, agree with above note.  I dictated the care and orders written for this patient under my direction.  Rush Farmer, MD [phone]

## 2013-10-19 NOTE — Progress Notes (Signed)
Wasted 40 mg of iv gtt versed and 75 cc of iv gtt fentanly in sink, witnessed by Georgette DoverSabo Ameh, RN

## 2013-10-20 ENCOUNTER — Inpatient Hospital Stay (HOSPITAL_COMMUNITY): Payer: Medicaid Other

## 2013-10-20 LAB — GLUCOSE, CAPILLARY
GLUCOSE-CAPILLARY: 82 mg/dL (ref 70–99)
GLUCOSE-CAPILLARY: 98 mg/dL (ref 70–99)
Glucose-Capillary: 98 mg/dL (ref 70–99)
Glucose-Capillary: 87 mg/dL (ref 70–99)
Glucose-Capillary: 72 mg/dL (ref 70–99)
Glucose-Capillary: 87 mg/dL (ref 70–99)

## 2013-10-20 LAB — BASIC METABOLIC PANEL
BUN: 13 mg/dL (ref 6–23)
CO2: 30 mEq/L (ref 19–32)
CREATININE: 0.6 mg/dL (ref 0.50–1.10)
Calcium: 8.1 mg/dL — ABNORMAL LOW (ref 8.4–10.5)
Chloride: 106 mEq/L (ref 96–112)
GFR calc non Af Amer: >90 mL/min (ref >90)
Glucose, Bld: 95 mg/dL (ref 70–99)
POTASSIUM: 3 meq/L — AB (ref 3.7–5.3)
Sodium: 149 mEq/L — ABNORMAL HIGH (ref 137–147)

## 2013-10-20 LAB — PHOSPHORUS: Phosphorus: 1.4 mg/dL — ABNORMAL LOW (ref 2.3–4.6)

## 2013-10-20 LAB — CBC
HCT: 29.5 % — ABNORMAL LOW (ref 36.0–46.0)
Hemoglobin: 9.5 g/dL — ABNORMAL LOW (ref 12.0–15.0)
MCH: 28 pg (ref 26.0–34.0)
MCHC: 32.2 g/dL (ref 30.0–36.0)
MCV: 87 fL (ref 78.0–100.0)
PLATELETS: 412 10*3/uL — AB (ref 150–400)
RBC: 3.39 MIL/uL — ABNORMAL LOW (ref 3.87–5.11)
RDW: 15.5 % (ref 11.5–15.5)
WBC: 11.4 10*3/uL — AB (ref 4.0–10.5)

## 2013-10-20 LAB — MAGNESIUM: MAGNESIUM: 1.8 mg/dL (ref 1.5–2.5)

## 2013-10-20 MED ORDER — SODIUM CHLORIDE 0.9 % IV SOLN
500.0000 mg | Freq: Two times a day (BID) | INTRAVENOUS | Status: DC
  Administered 2013-10-20 – 2013-10-23 (×6): 500 mg via INTRAVENOUS
  Filled 2013-10-20 (×7): qty 5

## 2013-10-20 MED ORDER — POTASSIUM CHLORIDE 10 MEQ/100ML IV SOLN
10.0000 meq | INTRAVENOUS | Status: AC
  Administered 2013-10-20 (×3): 10 meq via INTRAVENOUS
  Filled 2013-10-20 (×3): qty 100

## 2013-10-20 MED ORDER — SODIUM PHOSPHATE 3 MMOLE/ML IV SOLN
30.0000 mmol | Freq: Once | INTRAVENOUS | Status: AC
  Administered 2013-10-20: 30 mmol via INTRAVENOUS
  Filled 2013-10-20: qty 10

## 2013-10-20 MED ORDER — WHITE PETROLATUM GEL
Status: AC
  Administered 2013-10-20: 19:00:00
  Filled 2013-10-20: qty 5

## 2013-10-20 MED ORDER — RISPERIDONE 0.5 MG PO TABS
0.5000 mg | ORAL_TABLET | Freq: Two times a day (BID) | ORAL | Status: DC
  Filled 2013-10-20 (×4): qty 1

## 2013-10-20 MED ORDER — DEXTROSE 5 % IV SOLN
INTRAVENOUS | Status: DC
  Administered 2013-10-20 – 2013-10-21 (×2): via INTRAVENOUS
  Administered 2013-10-22: 1000 mL via INTRAVENOUS
  Administered 2013-10-22: 21:00:00 via INTRAVENOUS
  Administered 2013-10-23 – 2013-10-25 (×2): 50 mL via INTRAVENOUS
  Administered 2013-10-26: 1000 mL via INTRAVENOUS

## 2013-10-20 MED ORDER — METHYLPREDNISOLONE SODIUM SUCC 40 MG IJ SOLR
40.0000 mg | Freq: Three times a day (TID) | INTRAMUSCULAR | Status: DC
  Administered 2013-10-20 – 2013-10-21 (×2): 40 mg via INTRAVENOUS
  Filled 2013-10-20 (×5): qty 1

## 2013-10-20 MED ORDER — PANTOPRAZOLE SODIUM 40 MG IV SOLR
40.0000 mg | INTRAVENOUS | Status: DC
  Administered 2013-10-20 – 2013-10-22 (×3): 40 mg via INTRAVENOUS
  Filled 2013-10-20 (×5): qty 40

## 2013-10-20 MED ORDER — POTASSIUM CHLORIDE 10 MEQ/100ML IV SOLN
10.0000 meq | Freq: Once | INTRAVENOUS | Status: AC
  Administered 2013-10-20: 10 meq via INTRAVENOUS
  Filled 2013-10-20: qty 100

## 2013-10-20 MED ORDER — HALOPERIDOL LACTATE 5 MG/ML IJ SOLN
1.0000 mg | INTRAMUSCULAR | Status: DC | PRN
  Administered 2013-10-20 – 2013-10-21 (×3): 4 mg via INTRAVENOUS
  Filled 2013-10-20 (×3): qty 1

## 2013-10-20 MED ORDER — MAGNESIUM SULFATE 40 MG/ML IJ SOLN
2.0000 g | Freq: Once | INTRAMUSCULAR | Status: AC
  Administered 2013-10-20: 2 g via INTRAVENOUS
  Filled 2013-10-20: qty 50

## 2013-10-20 NOTE — Progress Notes (Signed)
eLink Physician-Brief Progress Note Patient Name: [name] [name] DOB: 10/10/1960 MRN: [id number]  Date of Service  10/20/2013   HPI/Events of Note   delerium  eICU Interventions  Haldol Restraints for safety   Intervention Category Major Interventions: Delirium, psychosis, severe agitation - evaluation and management  [name] V. 10/20/2013, 12:53 AM

## 2013-10-20 NOTE — Progress Notes (Signed)
[facility] FOR AM LAB REPLACEMENT ONLY  The patient does [facility] the [facility] Electrolyte Re[facility] Protocol based on the criteria listed below:   1. Is GFR >/= 40 ml/min? yes  Patient's GFR today is >90 2. Is urine output >/= 0.5 ml/kg/hr for the last 6 hours? yes Patient's UOP is 7.6 ml/kg/hr 3. Is BUN < 60 mg/dL? yes  Patient's BUN today is 13 4. Abnormal electrolyte(s): K3.0 5. Ordered repletion with: 3440meq/KCL/IV 6. If a panic level lab has been reported, has the CCM MD in charge been notified? yes.   Physician:  Elisabeth Cara Alva, MD  [place] NakayamaChisholm, Kit Brubacher William 10/20/2013 4:57 AM

## 2013-10-20 NOTE — Progress Notes (Signed)
PULMONOLOGY/CRITICAL CARE   Name: [name] [name] MRN: [id number] DOB: 18-Jun-1961    ADMISSION DATE:  10/14/2013 CONSULTATION DATE: 10/14/13  REFERRING MD :  ED PRIMARY SERVICE: PCCM  CHIEF COMPLAINT:  fever  BRIEF PATIENT DESCRIPTION: 53 yo female with recent admission for CAP re-presents with fever, leukocytosis, chest pain, and hypoxia in setting of possible seizure vs pre-syncope.  SIGNIFICANT EVENTS / STUDIES:  1/28 Abd [place] >>No hydronephrosis, Left kidney 65m stone?, Right kidney 68mstone? Mild intra and extrahepatic biliary ductal prominence,  1/28 CTA chest >>> No PE.  Bilateral prominently upper lobe mixed airspace and ground-glass opacities, multi lobar pneumonia is favored.  Mediastinal and bilateral hilar adenopathy  1/28 Bronchoscopy >>> unimpressive secretions, no DAH, no hsv lesions, no mass lesions, BAL sent. 1/28 TTE >>> EF 65-70%, grade 1 DD, PA peak 38, no change from prior 1/28 CT head >>> Negative for acute process. 1/29 EEG >>> no evidence of seizure activity 1/31 2. 5 hours on ps 5/5 2/1 Extubated, neg 5.6 liters  LINES / TUBES: 1/28 LIJ >>> 2/1 (patient removed) 1/28 OETT >>>2/1 1/28 Foley >> 1/29 A line >>>out  CULTURES: 1/18 Blood cultures >> Neg 1/27 UCx >>>> Neg 1/27 BCx >>>> NGTD 1/28 Strep culture >>>neg 1/28 MRSA >>> neg 1/28 Pneumocystis smear (BAL) >>> neg 1/28 AFB (BAL) >>>> smear neg, culture pending 1/28 Resp Culture (BAL) >>> NGTD 1/28 Fungus Culture (BAL) >>> No yeast or fungal elements seen, culture in process 1/28 GAS (BAL) >>>>neg 1/28 Respiratory virus panel (BAL) >>>neg HIV neg  AUTOIMMUNE: Collected 1/28  p-ANCA>>>pos  c-ANCA >>> neg  Atypical p-ANCA>>>neg  Myeloperoxidase Abx >>> 1.2 (pos) Serine Protease 3 >>> <1.0 (neg) ESR >>> 98 ANA >>> pos C3 >>> 108 C4 >>> 22 RF >>> 13 Total Complement >>>56 dsDNA Ab >>> 1 CRP >>> 23.8  Cryoglobulin >>>  ANTIBIOTICS: Home on levofloxacin 1/27  Vanc >>>1/30 1/27 Zosyn  >>>1/30 1/30 ceftriaxone>>>2/3 plan stop 1/27 Azithromycin >>>1/29 1/28 tamiflu stopped 1/31  VITAL SIGNS: Temp:  [98 F (36.7 C)-99.1 F (37.3 C)] 98 F (36.7 C) (02/02 0828) Pulse Rate:  [48-103] 54 (02/02 0900) Resp:  [11-32] 32 (02/02 0900) BP: (92-157)/(63-106) 156/79 mmHg (02/02 0900) SpO2:  [78 %-100 %] 97 % (02/02 0900) Weight:  [129 lb 13.6 oz (58.9 kg)] 129 lb 13.6 oz (58.9 kg) (02/02 0500)    INTAKE / OUTPUT: Intake/Output     02/01 0701 - 02/02 0700 02/02 0701 - 02/03 0700   P.O.     I.V. (mL/kg) 1111.5 (18.9) 30.4 (0.5)   Other     NG/GT 60    IV Piggyback 487 286   Total Intake(mL/kg) 1658.5 (28.2) 316.4 (5.4)   Urine (mL/kg/hr) 7410 (5.2) 800 (5.6)   Total Output 7410 800   Net -5751.5 -483.6         SUBJECTIVE:  Potassium and phosphate low. Both repleted overnight. Agitated as well overnight. Started on precedex drip and haldol. Nursing reports haldol of no benefit. Also received fentanyl this am with benefit for 15 minutes, but also with brady to the 30's. Nurse states patient was able to chew and swallow ice this am.  PHYSICAL EXAMINATION: General: groaning, in restraints  HEENT: Arial, mmm Cardiac: rrr, no rubs, murmurs or gallops Pulm: clear breath sounds, no wheezing Abd: soft, nontender, nondistended, hypoactive bowel sounds Ext: warm and well perfused Neuro: MAE x 4 agitated or sedated   LABS:  CBC  Recent Labs Lab 10/17/13 0500 10/19/13 0626 10/20/13 0242  WBC 15.7* 8.5 11.4*  HGB 8.3* 7.9* 9.5*  HCT 26.3* 25.2* 29.5*  PLT 446* 373 412*   BMET  Recent Labs Lab 10/17/13 1517 10/19/13 0626 10/20/13 0242  NA 139 146 149*  K 3.5* 3.3* 3.0*  CL 105 109 106  CO2 '25 29 30  ' BUN '13 17 13  ' CREATININE 0.53 0.53 0.60  GLUCOSE 182* 124* 95   Electrolytes  Recent Labs Lab 10/16/13 0409  10/17/13 1517 10/19/13 0626 10/20/13 0242  CALCIUM 7.4*  < > 8.1* 8.0* 8.1*  MG 1.9  --   --  2.1 1.8  PHOS  --   --   --  2.1* 1.4*  < > =  values in this interval not displayed. Sepsis Markers  Recent Labs Lab 10/14/13 1905 10/15/13 0120 10/16/13 0400  LATICACIDVEN 2.97*  --   --   PROCALCITON  --  2.19 2.51   ABG  Recent Labs Lab 10/16/13 0909 10/17/13 0731 10/17/13 0952  PHART 7.304* 7.464* 7.324*  PCO2ART 48.3* 35.7 49.0*  PO2ART 68.0* 144.0* 105.0*   Liver Enzymes  Recent Labs Lab 10/14/13 1753 10/16/13 0409  AST 33 18  ALT 24 14  ALKPHOS 160* 128*  BILITOT 0.5 <0.2*  ALBUMIN 2.2* 1.5*   Cardiac Enzymes  Recent Labs Lab 10/15/13 0442 10/17/13 1517  TROPONINI 1.02* <0.30   Glucose  Recent Labs Lab 10/19/13 1056 10/19/13 1453 10/19/13 1942 10/19/13 2338 10/20/13 0340 10/20/13 0730  GLUCAP 106* 94 95 98 98 87   Dg Chest Port 1 View  10/20/2013   CLINICAL DATA:  Shortness of breath after extubation  EXAM: PORTABLE CHEST - 1 VIEW  COMPARISON:  10/19/2013  FINDINGS: Interval removal of support apparatus. Interval increase in diffuse interstitial opacification. Background biapical infiltrates, assessed by previous CT imaging. Stable heart size and mediastinal contours. No pleural effusion or pneumothorax.  IMPRESSION: 1. Interval development of pulmonary edema. 2. Background biapical infiltrates, pneumonia based on CT 10/16/2013.   Electronically Signed   By: Jorje Guild M.D.   On: 10/20/2013 06:08   Dg Chest Port 1 View  10/19/2013   CLINICAL DATA:  Ventilated patient.  EXAM: PORTABLE CHEST - 1 VIEW  COMPARISON:  10/17/2013  FINDINGS: Cardiac silhouette is normal in size.  Normal mediastinal contours.  Patchy areas of predominantly upper lobe airspace opacity have mildly improved. No new lung opacities. No pleural effusion or pneumothorax.  Endotracheal tube, left internal jugular central venous line and nasogastric tube are stable well-positioned.  IMPRESSION: 1. Improved lung infiltrates. 2. Support apparatus is stable in well positioned.   Electronically Signed   By: Lajean Manes M.D.   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?, autoimmune pneumonitis) (no biopsy) R/o pulm renal syndrome (had rbc in urine) NO DAH P:   Extubated 2/1 Solumedrol 40 q6h for treatment potential vasculitis, likley has benefited, will reduce slowly to 1 mg/kg over week pcxr in am  Likely to require open lung BX with such borderline titers, MP and p ANCA Continue neg balance See ID  CARDIOVASCULAR A:  Hypotensive secondary to septic shock (resolved 1/31 off pressors) Mild PA pressures noted, echo unchanged from previous 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 P:  Pressors as needed Tele remains needed on precedex  RENAL Lab Results  Component Value Date   CREATININE 0.60 10/20/2013   CREATININE 0.53 10/19/2013  CREATININE 0.53 10/17/2013    A:   Hematuria may be secondary to pulm renal syndrome Hypokalemia  Hypernatremia Hypophosphatemia Autodiuresis P:   BMP as needed D5W @ 50/hr Replete potassium and phosphate as needed Replace mag Allow autodiuresis  GASTROINTESTINAL A:  Abdominal pain unclear etiology, from resp?, pt on bid Protonix home regimen, denies h/o ulcer, lipase negative, amylase neg, AST/ALT/ Bili wnl nonspecific findings on Abdominal U/S.  P:   Protonix  Swallow eval - order slp Give diet if passes swallow eval  HEMATOLOGIC  Recent Labs  10/19/13 0626 10/20/13 0242  HGB 7.9* 9.5*    A:  Anemia of chronic disease Leukocytosis increased from previous DVT ppx hemoconctrating P:  Hgb improved, cont to monitor Lovenox, follow creatine  INFECTIOUS A:  HCAP and super infection Severe Sepsis  Pharyngitis -strep swab neg HIV neg P:   Cultures as above Ceftriaxone to stop date  ENDOCRINE A:   rel AI (cortisol 11.1) Likely sick euthyroid TSH 0.226, T3 39.4, free T4 0.69 P:   Cont to  monitor Steroids, see pulm, reduce ,contributing to psychosis  NEUROLOGIC A:   Seizure disorder, question of reported seizure no evidence on EEG Fibromyalgia- has this been vasculitis over time? Chronic pain AMS Delirium  P:   Cont home regimen Tri-leptal Cont Lyrica Sedation with precedex, some brady, goal to wean to off, reduce at minimum Repeat 12 lead  Add Risperdal, if unable add haldol ( last qtc was less 550)  Tommi Rumps, MD [place] PGY-2  Ccm time 30 min    Lavon Paganini. Titus Mould, MD, [place] Pgr: [facility]

## 2013-10-20 NOTE — Progress Notes (Signed)
[facility] FOR AM LAB REPLACEMENT ONLY  The patient does [facility] for the [facility] Electrolyte Replacment Protocol based on the criteria listed below:   1. Is GFR >/= 40 ml/min? yes  Patient's GFR today is 51 2. Is urine output >/= 0.5 ml/kg/hr for the last 6 hours? yes Patient's UOP is 7.6 ml/kg/hr 3. Is BUN < 60 mg/dL? yes  Patient's BUN today is 13 4. Abnormal electrolyte(s): Phos1.4 5. Ordered repletion with: IV 6. If a panic level lab has been reported, has the CCM MD in charge been notified? yes.   Physician:  [name] Cara Alva MD  [name] [name], [name] [name] [name] [name] 10/20/2013 5:52 AM

## 2013-10-21 ENCOUNTER — Inpatient Hospital Stay (HOSPITAL_COMMUNITY): Payer: Medicaid Other

## 2013-10-21 DIAGNOSIS — R404 Transient alteration of awareness: Secondary | ICD-10-CM

## 2013-10-21 LAB — BASIC METABOLIC PANEL
BUN: 13 mg/dL (ref 6–23)
CALCIUM: 8.7 mg/dL (ref 8.4–10.5)
CO2: 25 mEq/L (ref 19–32)
CREATININE: 0.66 mg/dL (ref 0.50–1.10)
Chloride: 100 mEq/L (ref 96–112)
Glucose, Bld: 100 mg/dL — ABNORMAL HIGH (ref 70–99)
Potassium: 3.5 mEq/L — ABNORMAL LOW (ref 3.7–5.3)
Sodium: 145 mEq/L (ref 137–147)

## 2013-10-21 LAB — CRYOGLOBULIN

## 2013-10-21 LAB — GLUCOSE, CAPILLARY
GLUCOSE-CAPILLARY: 81 mg/dL (ref 70–99)
GLUCOSE-CAPILLARY: 112 mg/dL — AB (ref 70–99)
GLUCOSE-CAPILLARY: 90 mg/dL (ref 70–99)
GLUCOSE-CAPILLARY: 88 mg/dL (ref 70–99)
Glucose-Capillary: 82 mg/dL (ref 70–99)
Glucose-Capillary: 96 mg/dL (ref 70–99)

## 2013-10-21 LAB — CULTURE, BLOOD (ROUTINE X 2)
Culture: NO GROWTH
Culture: NO GROWTH

## 2013-10-21 LAB — LEGIONELLA PROFILE(CULTURE+DFA/SMEAR): Legionella Antigen (DFA): NEGATIVE

## 2013-10-21 LAB — CBC
HCT: 33.8 % — ABNORMAL LOW (ref 36.0–46.0)
Hemoglobin: 11.3 g/dL — ABNORMAL LOW (ref 12.0–15.0)
MCH: 28.5 pg (ref 26.0–34.0)
MCHC: 33.4 g/dL (ref 30.0–36.0)
MCV: 85.1 fL (ref 78.0–100.0)
PLATELETS: 504 10*3/uL — AB (ref 150–400)
RBC: 3.97 MIL/uL (ref 3.87–5.11)
RDW: 15.8 % — AB (ref 11.5–15.5)
WBC: 11.8 10*3/uL — ABNORMAL HIGH (ref 4.0–10.5)

## 2013-10-21 LAB — LACTIC ACID, PLASMA: Lactic Acid, Venous: 2.3 mmol/L — ABNORMAL HIGH (ref 0.5–2.2)

## 2013-10-21 LAB — MAGNESIUM: Magnesium: 2.6 mg/dL — ABNORMAL HIGH (ref 1.5–2.5)

## 2013-10-21 LAB — PHOSPHORUS: Phosphorus: 3.2 mg/dL (ref 2.3–4.6)

## 2013-10-21 MED ORDER — BENZTROPINE MESYLATE 1 MG/ML IJ SOLN
0.5000 mg | Freq: Two times a day (BID) | INTRAMUSCULAR | Status: DC
  Administered 2013-10-21 – 2013-10-22 (×4): 0.5 mg via INTRAVENOUS
  Filled 2013-10-21 (×6): qty 0.5

## 2013-10-21 MED ORDER — RISPERIDONE 1 MG PO TBDP
1.0000 mg | ORAL_TABLET | Freq: Two times a day (BID) | ORAL | Status: DC
  Administered 2013-10-21 – 2013-10-22 (×3): 1 mg via ORAL
  Filled 2013-10-21 (×6): qty 1

## 2013-10-21 MED ORDER — HALOPERIDOL LACTATE 5 MG/ML IJ SOLN: 4.0000 mg | INTRAMUSCULAR | Status: DC | PRN

## 2013-10-21 MED ORDER — HALOPERIDOL 2 MG PO TABS
4.0000 mg | ORAL_TABLET | ORAL | Status: DC | PRN
  Filled 2013-10-21: qty 2

## 2013-10-21 MED ORDER — RISPERIDONE 0.5 MG PO TBDP
0.5000 mg | ORAL_TABLET | Freq: Two times a day (BID) | ORAL | Status: DC
  Administered 2013-10-21: 0.5 mg via ORAL
  Filled 2013-10-21 (×2): qty 1

## 2013-10-21 MED ORDER — POTASSIUM CHLORIDE 10 MEQ/100ML IV SOLN
10.0000 meq | INTRAVENOUS | Status: AC
  Administered 2013-10-21 (×2): 10 meq via INTRAVENOUS
  Filled 2013-10-21 (×2): qty 100

## 2013-10-21 MED ORDER — METHYLPREDNISOLONE SODIUM SUCC 40 MG IJ SOLR
30.0000 mg | Freq: Two times a day (BID) | INTRAMUSCULAR | Status: DC
  Administered 2013-10-21 – 2013-10-22 (×2): 30 mg via INTRAVENOUS
  Filled 2013-10-21 (×4): qty 0.75

## 2013-10-21 MED ORDER — LORAZEPAM 2 MG/ML IJ SOLN
0.5000 mg | Freq: Four times a day (QID) | INTRAMUSCULAR | Status: DC
  Administered 2013-10-21 – 2013-10-23 (×7): 0.5 mg via INTRAVENOUS
  Filled 2013-10-21 (×7): qty 1

## 2013-10-21 MED ORDER — HALOPERIDOL LACTATE 5 MG/ML IJ SOLN
4.0000 mg | INTRAMUSCULAR | Status: DC | PRN
  Administered 2013-10-21: 4 mg via INTRAVENOUS
  Filled 2013-10-21 (×2): qty 1

## 2013-10-21 NOTE — Progress Notes (Signed)
[facility] FOR [facility] LAB REPLACEMENT ONLY  The patient [facility] based on the criteria listed below:   1. Is GFR >/= 40 ml/min? yes  Patient's GFR today is >90 2. Is urine output >/= 0.5 ml/kg/hr for the last 6 hours? yes Patient's UOP is 3.1 ml/kg/hr 3. Is BUN < 60 mg/dL? yes  Patient's BUN today is 13 4. Abnormal electrolyte(s):K3.5 5. Ordered repletion with: 6920meq/IV 6. If a panic level lab has been reported, has the CCM MD in charge been notified? yes.   Physician:  Holland CommonsE Deterding, MD  [place] NakayamaChisholm, Nolen Lindamood William 10/21/2013 5:07 AM

## 2013-10-21 NOTE — Evaluation (Signed)
Clinical/Bedside Swallow Evaluation Patient Details  Name: [name] [name] MRN: [id number] Date of Birth: 12/27/1960  Today's Date: 10/21/2013 Time: 0830-0902 SLP Time Calculation (min): 32 min  Past Medical History:  Past Medical History  Diagnosis Date  . Fibromyalgia   . Chronic pain   . Seizures   . Migraine   . Pneumonia    Past Surgical History: History reviewed. No pertinent past surgical history. HPI:  53 yo female with recent admission for CAP re-presents with fever, leukocytosis, chest pain, and hypoxia in setting of possible seizure vs pre-syncope. Intubated from 1/27 to 2/1.   Assessment / Plan / Recommendation Clinical Impression  Pt is delirious and anxious. Accepted trials of ice from mother with 2 instances of mastication and timely swallow with no evidence of aspiration. Otherwise, pt held bolus orally with eventual expectoration despite max verbal, visual and tactile cues from SLP and family. Pt likely will have adequate swallow response after recovering from extubation, other than baseline esophageal dysphagia, when mentation improves. For now agree with attempts of pills in puree and ice chips. Otherwise NPO.     Aspiration Risk  Moderate    Diet Recommendation NPO except meds;Ice chips PRN after oral care        Other  Recommendations Recommended Consults: Consider GI evaluation;Consider esophageal assessment Oral Care Recommendations: Oral care Q4 per protocol   Follow Up Recommendations  Inpatient Rehab    Frequency and Duration min 2x/week  2 weeks   Pertinent Vitals/Pain NA    SLP Swallow Goals     Swallow Study Prior Functional Status       General HPI: 53 yo female with recent admission for CAP re-presents with fever, leukocytosis, chest pain, and hypoxia in setting of possible seizure vs pre-syncope. Intubated from 1/27 to 2/1. Type of Study: Bedside swallow evaluation Previous Swallow Assessment: BSE 1/22 - suspected esopahgeal dysphagia.  Rec Dys 3/thin with esophageal precautions.  Diet Prior to this Study: NPO Temperature Spikes Noted: No Respiratory Status: Nasal cannula History of Recent Intubation: Yes Length of Intubations (days): 6 days Date extubated: 10/19/13 Behavior/Cognition: Confused;Uncooperative;Distractible;Requires cueing;Decreased sustained attention;Agitated Oral Cavity - Dentition: Adequate natural dentition Self-Feeding Abilities: Total assist Patient Positioning: Upright in bed Baseline Vocal Quality: Hoarse;Low vocal intensity Volitional Cough: Cognitively unable to elicit Volitional Swallow: Unable to elicit    Oral/Motor/Sensory Function Overall Oral Motor/Sensory Function: Appears within functional limits for tasks assessed (but does not follow commands)   Ice Chips Ice chips: Impaired Presentation: Spoon (responds better to mother) Oral Phase Impairments: Poor awareness of bolus;Impaired anterior to posterior transit Oral Phase Functional Implications: Oral holding;Prolonged oral transit   Thin Liquid Thin Liquid: Not tested    Nectar Thick Nectar Thick Liquid: Not tested   Honey Thick Honey Thick Liquid: Not tested   Puree Puree: Impaired Presentation: Spoon Oral Phase Impairments: Poor awareness of bolus;Impaired anterior to posterior transit Oral Phase Functional Implications: Oral holding;Prolonged oral transit Pharyngeal Phase Impairments: Unable to trigger swallow   Solid   GO    Solid: Not tested      Harlon DittyBonnie Areon Cocuzza, MA CCC-SLP [phone]  Claudine MoutonDeBlois, Jailen Lung Caroline 10/21/2013,9:12 AM

## 2013-10-21 NOTE — Progress Notes (Signed)
Foley catheter not d/c'd due to patient inability to communicate need to void and she is already reddened in her perineal area.  Will discuss with MD tomorrow.

## 2013-10-21 NOTE — Consult Note (Signed)
Reason for Consult: AMS  (history of fibromyalgia, seizure, Pneumonia and sepsis) Referring Physician: Raylene Miyamoto, MD  [name] [name] is an 53 y.o. female.  HPI: Patient was seen and chart reviewed and case discussed with staff RN and Dr. Titus Mould. Patient has been non verbal since extubated about two days ago and has generalized muscular contractions, questionable dyskinesias verses underlying seizure activity. She was awake, open her eyes and occasionally smiles and nods her head but mostly staring in space. She was not able to swallow her own secretions and needs frequent suctioning as per staff. She has no family members at bed side at this time.     Medical history: she is a 53 yo Hispanic female from [place] 2-3 months ago with hx significant for seizure disorder (no seizures in decades), fibromyalgia, chronic pain and recent admission for pneumonia discharged 2 days prior to presentation today. Reports that she was doing marginally better after discharge and completing course of Levaquin but then developed fever, chills, sore throat, and "left lung" pain. States that her subjective fever got so high that she "passed out" . Her brother states that her eyes rolled back into her head and she was briefly unresponsive until her mother gave her "2 breaths" for mouth-to mouth CPR. Family states that patient never stopped breathing. She was then brought to ED via EMS and found to have temp 103F, pulse ~140 bpm, with bp 94/56 with leukocytosis >40K and CXR demonstrating improved right mid and upper lung opacities with worsening left upper lobe airspace disease. During monitoring in ED pt became hypoxic with O2 83-85% on nasal canula requiring NRM.  ROS: not completed due to her current mental status.   MSE: Patient was in ICU bed with soft restraints to her bed and has IV fluids and antibiotics for infections. She was given risperidone M tab with limited response. Patient seeks like understanding  because she is nodding head but not able to respond verbally or consistently. She has been shaking her legs and moving all her body parts, sometimes in agony. She does not seems like responding to hallucinations or delusions.   Past Medical History  Diagnosis Date  . Fibromyalgia   . Chronic pain   . Seizures   . Migraine   . Pneumonia     History reviewed. No pertinent past surgical history.  No family history on file.  Social History:  reports that she has never smoked. She has never used smokeless tobacco. She reports that she does not drink alcohol or use illicit drugs.  Allergies:  Allergies  Allergen Reactions  . Shellfish Allergy Anaphylaxis    Medications: I have reviewed the patient's current medications.  Results for orders placed during the hospital encounter of 10/14/13 (from the past 48 hour(s))  GLUCOSE, CAPILLARY     Status: None   Collection Time    10/19/13  2:53 PM      Result Value Range   Glucose-Capillary 94  70 - 99 mg/dL  GLUCOSE, CAPILLARY     Status: None   Collection Time    10/19/13  7:42 PM      Result Value Range   Glucose-Capillary 95  70 - 99 mg/dL  GLUCOSE, CAPILLARY     Status: None   Collection Time    10/19/13 11:38 PM      Result Value Range   Glucose-Capillary 98  70 - 99 mg/dL  CBC     Status: Abnormal   Collection Time  10/20/13  2:42 AM      Result Value Range   WBC 11.4 (*) 4.0 - 10.5 K/uL   RBC 3.39 (*) 3.87 - 5.11 MIL/uL   Hemoglobin 9.5 (*) 12.0 - 15.0 g/dL   Comment: DELTA CHECK NOTED     REPEATED TO VERIFY   HCT 29.5 (*) 36.0 - 46.0 %   MCV 87.0  78.0 - 100.0 fL   MCH 28.0  26.0 - 34.0 pg   MCHC 32.2  30.0 - 36.0 g/dL   RDW 15.5  11.5 - 15.5 %   Platelets 412 (*) 150 - 400 K/uL  BASIC METABOLIC PANEL     Status: Abnormal   Collection Time    10/20/13  2:42 AM      Result Value Range   Sodium 149 (*) 137 - 147 mEq/L   Potassium 3.0 (*) 3.7 - 5.3 mEq/L   Chloride 106  96 - 112 mEq/L   CO2 30  19 - 32 mEq/L    Glucose, Bld 95  70 - 99 mg/dL   BUN 13  6 - 23 mg/dL   Creatinine, Ser 0.60  0.50 - 1.10 mg/dL   Calcium 8.1 (*) 8.4 - 10.5 mg/dL   GFR calc non Af Amer >90  >90 mL/min   GFR calc Af Amer >90  >90 mL/min   Comment: (NOTE)     The eGFR has been calculated using the CKD EPI equation.     This calculation has not been validated in all clinical situations.     eGFR's persistently <90 mL/min signify possible Chronic Kidney     Disease.  MAGNESIUM     Status: None   Collection Time    10/20/13  2:42 AM      Result Value Range   Magnesium 1.8  1.5 - 2.5 mg/dL  PHOSPHORUS     Status: Abnormal   Collection Time    10/20/13  2:42 AM      Result Value Range   Phosphorus 1.4 (*) 2.3 - 4.6 mg/dL  GLUCOSE, CAPILLARY     Status: None   Collection Time    10/20/13  3:40 AM      Result Value Range   Glucose-Capillary 98  70 - 99 mg/dL  GLUCOSE, CAPILLARY     Status: None   Collection Time    10/20/13  7:30 AM      Result Value Range   Glucose-Capillary 87  70 - 99 mg/dL  GLUCOSE, CAPILLARY     Status: None   Collection Time    10/20/13 12:02 PM      Result Value Range   Glucose-Capillary 72  70 - 99 mg/dL  GLUCOSE, CAPILLARY     Status: None   Collection Time    10/20/13  3:24 PM      Result Value Range   Glucose-Capillary 87  70 - 99 mg/dL  GLUCOSE, CAPILLARY     Status: None   Collection Time    10/20/13  6:59 PM      Result Value Range   Glucose-Capillary 82  70 - 99 mg/dL  GLUCOSE, CAPILLARY     Status: None   Collection Time    10/20/13 11:53 PM      Result Value Range   Glucose-Capillary 82  70 - 99 mg/dL  CBC     Status: Abnormal   Collection Time    10/21/13  2:22 AM      Result Value Range  WBC 11.8 (*) 4.0 - 10.5 K/uL   RBC 3.97  3.87 - 5.11 MIL/uL   Hemoglobin 11.3 (*) 12.0 - 15.0 g/dL   HCT 33.8 (*) 36.0 - 46.0 %   MCV 85.1  78.0 - 100.0 fL   MCH 28.5  26.0 - 34.0 pg   MCHC 33.4  30.0 - 36.0 g/dL   RDW 15.8 (*) 11.5 - 15.5 %   Platelets 504 (*) 150 - 400  K/uL  BASIC METABOLIC PANEL     Status: Abnormal   Collection Time    10/21/13  2:22 AM      Result Value Range   Sodium 145  137 - 147 mEq/L   Potassium 3.5 (*) 3.7 - 5.3 mEq/L   Chloride 100  96 - 112 mEq/L   CO2 25  19 - 32 mEq/L   Glucose, Bld 100 (*) 70 - 99 mg/dL   BUN 13  6 - 23 mg/dL   Creatinine, Ser 0.66  0.50 - 1.10 mg/dL   Calcium 8.7  8.4 - 10.5 mg/dL   GFR calc non Af Amer >90  >90 mL/min   GFR calc Af Amer >90  >90 mL/min   Comment: (NOTE)     The eGFR has been calculated using the CKD EPI equation.     This calculation has not been validated in all clinical situations.     eGFR's persistently <90 mL/min signify possible Chronic Kidney     Disease.  MAGNESIUM     Status: Abnormal   Collection Time    10/21/13  2:22 AM      Result Value Range   Magnesium 2.6 (*) 1.5 - 2.5 mg/dL  PHOSPHORUS     Status: None   Collection Time    10/21/13  2:22 AM      Result Value Range   Phosphorus 3.2  2.3 - 4.6 mg/dL  GLUCOSE, CAPILLARY     Status: None   Collection Time    10/21/13  4:24 AM      Result Value Range   Glucose-Capillary 96  70 - 99 mg/dL   Comment 1 Documented in Chart     Comment 2 Notify RN    GLUCOSE, CAPILLARY     Status: None   Collection Time    10/21/13  8:06 AM      Result Value Range   Glucose-Capillary 81  70 - 99 mg/dL  GLUCOSE, CAPILLARY     Status: Abnormal   Collection Time    10/21/13 11:19 AM      Result Value Range   Glucose-Capillary 112 (*) 70 - 99 mg/dL    Dg Chest Port 1 View  10/21/2013   CLINICAL DATA:  Cough and shortness of breath.  EXAM: PORTABLE CHEST - 1 VIEW  COMPARISON:  DG CHEST 1V PORT dated 10/20/2013; DG CHEST 1V PORT dated 10/19/2013; CT ANGIO CHEST W/CM &/OR WO/CM dated 10/15/2013  FINDINGS: Underlying mild hyperinflation. Midline trachea. Normal heart size. No pleural effusion or pneumothorax. Improved to resolved interstitial edema. Mild asymmetric pulmonary venous congestion suspected, primarily on the left. Patchy  left-sided airspace disease is also slightly improved. Most apparent at the left lung base.  IMPRESSION: Improved interstitial and airspace disease. Primarily felt to represent pulmonary edema. Improving infection could look similar.   Electronically Signed   By: Abigail Miyamoto M.D.   On: 10/21/2013 07:08   Dg Chest Port 1 View  10/20/2013   CLINICAL DATA:  Shortness of breath after  extubation  EXAM: PORTABLE CHEST - 1 VIEW  COMPARISON:  10/19/2013  FINDINGS: Interval removal of support apparatus. Interval increase in diffuse interstitial opacification. Background biapical infiltrates, assessed by previous CT imaging. Stable heart size and mediastinal contours. No pleural effusion or pneumothorax.  IMPRESSION: 1. Interval development of pulmonary edema. 2. Background biapical infiltrates, pneumonia based on CT 10/16/2013.   Electronically Signed   By: Jorje Guild M.D.   On: 10/20/2013 06:08    Positive for agitation and altered mental status. Blood pressure 142/68, pulse 82, temperature 97.4 F (36.3 C), temperature source Oral, resp. rate 35, height '5\' 1"'  (1.549 m), weight 53.9 kg (118 lb 13.3 oz), SpO2 98.00%.   Assessment/Plan: Delirium NOS, (questionable sepsis and steroids role) Fibromyalgia and chronic pain syndrome Conversion disorder Vs seizure disorder  Recommendation:  Request neurology consult for possible seizure activity Ativan 0.5 mg IV Q6hours for agitation Risperidal M-tabs 1 mg PO BID/psychotic agitation Cogentin 0.5 mg IV BID for EPS Appreciate for psych consult and will follow up as clinically required   Kayleigh Broadwell,JANARDHAHA R. 10/21/2013, 2:17 PM

## 2013-10-21 NOTE — Progress Notes (Signed)
NUTRITION FOLLOW UP  Intervention:    Diet advancement as able per SLP/MD.  If unable to safely advance diet, recommend place NG feeding tube and resume TF utilizing 43M PEPuP Protocol with Jevity 1.2 at 25 ml/h and Prostat 30 ml BID on day 1; on day 2, increase to goal rate of 45 ml/h (1080 ml per day) to provide 1496 kcals, 90 gm protein, 875 ml free water daily.  Nutrition Dx:  Inadequate oral intake related to inability to eat as evidenced by NPO status. Ongoing.  Goal:   Intake to meet >90% of estimated nutrition needs. Unmet.  Monitor:   Diet advancement and PO intake vs. need to resume TF, labs, weight trend.  Assessment:   Pt admitted with fever and respiratory distress in setting of PNA, possible seizure, and pre-syncope. Pt with recent admission for CAP.    Patient was receiving Oxepa TF while intubated. She was extubated on 2/1. TF has been off since extubation. SLP following; recommends continuing NPO status for now.    Height: Ht Readings from Last 1 Encounters:  10/15/13 5\' 1"  (1.549 m)    Weight Status:   Wt Readings from Last 1 Encounters:  10/21/13 118 lb 13.3 oz (53.9 kg)  10/16/13  133 lb 13.1 oz (60.7 kg)  10/15/13  129 lb 3 oz (58.6 kg)   Body mass index is 22.46 kg/(m^2).   Re-estimated needs:  Kcal: 1400-1500 Protein: 70-90 gm Fluid: 1.4-1.6 L  Skin: no wounds  Diet Order: NPO   Intake/Output Summary (Last 24 hours) at 10/21/13 1521 Last data filed at 10/21/13 1513  Gross per 24 hour  Intake 1891.3 ml  Output   3010 ml  Net -1118.7 ml    Last BM: 2/1   Labs:   Recent Labs Lab 10/19/13 0626 10/20/13 0242 10/21/13 0222  NA 146 149* 145  K 3.3* 3.0* 3.5*  CL 109 106 100  CO2 29 30 25   BUN 17 13 13   CREATININE 0.53 0.60 0.66  CALCIUM 8.0* 8.1* 8.7  MG 2.1 1.8 2.6*  PHOS 2.1* 1.4* 3.2  GLUCOSE 124* 95 100*    CBG (last 3)   Recent Labs  10/21/13 0424 10/21/13 0806 10/21/13 1119  GLUCAP 96 81 112*    Scheduled  Meds: . antiseptic oral rinse  15 mL Mouth Rinse QID  . chlorhexidine  15 mL Mouth Rinse BID  . enoxaparin (LOVENOX) injection  40 mg Subcutaneous Q24H  . folic acid  1 mg Per Tube Daily  . insulin aspart  0-9 Units Subcutaneous Q4H  . levETIRAcetam  500 mg Intravenous Q12H  . methylPREDNISolone (SOLU-MEDROL) injection  30 mg Intravenous Q12H  . pantoprazole (PROTONIX) IV  40 mg Intravenous Q24H  . pregabalin  300 mg Oral BID  . risperiDONE  0.5 mg Oral BID  . sodium chloride  1,000 mL Intravenous Once    Continuous Infusions: . dexmedetomidine 0.9 mcg/kg/hr (10/21/13 1513)  . dextrose 50 mL/hr at 10/21/13 1200    Joaquin CourtsKimberly Harris, RD, LDN, CNSC Pager [phone] After Hours Pager [phone]

## 2013-10-21 NOTE — Progress Notes (Signed)
PT Cancellation Note  Patient Details Name: [name] [name] MRN: [id number] DOB: 05/24/1961   Cancelled Treatment:    Reason Eval/Treat Not Completed: Medical issues which prohibited therapy 10/21/2013   [name] [name], PT [phone]  (pager)  [name] [name], [name] [name] 10/21/2013, 3:36 PM

## 2013-10-21 NOTE — Progress Notes (Signed)
PULMONOLOGY/CRITICAL CARE   Name: [name] [name] MRN: [id number] DOB: [birth year]    ADMISSION DATE:  10/14/2013 CONSULTATION DATE: 10/14/13  REFERRING MD :  ED PRIMARY SERVICE: PCCM  CHIEF COMPLAINT:  fever  BRIEF PATIENT DESCRIPTION: 53 yo female with recent admission for CAP re-presents with fever, leukocytosis, chest pain, and hypoxia in setting of possible seizure vs pre-syncope.  SIGNIFICANT EVENTS / STUDIES:  1/28 Abd [place] >>No hydronephrosis, Left kidney 42m stone?, Right kidney 635mstone? Mild intra and extrahepatic biliary ductal prominence,  1/28 CTA chest >>> No PE.  Bilateral prominently upper lobe mixed airspace and ground-glass opacities, multi lobar pneumonia is favored.  Mediastinal and bilateral hilar adenopathy  1/28 Bronchoscopy >>> unimpressive secretions, no DAH, no hsv lesions, no mass lesions, BAL sent. 1/28 TTE >>> EF 65-70%, grade 1 DD, PA peak 38, no change from prior 1/28 CT head >>> Negative for acute process. 1/29 EEG >>> no evidence of seizure activity 1/31 2. 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 2/3 remains on precedex  LINES / TUBES: 1/28 LIJ >>> 2/1 (patient removed) 1/28 OETT >>>2/1 1/28 Foley >> 1/29 A line >>>out  CULTURES: 1/18 Blood cultures >> Neg 1/27 UCx >>>> Neg 1/27 BCx >>>> NGTD 1/28 Strep culture >>>neg 1/28 MRSA >>> neg 1/28 Pneumocystis smear (BAL) >>> neg 1/28 AFB (BAL) >>>> smear neg, culture pending 1/28 Resp Culture (BAL) >>> NGTD 1/28 Fungus Culture (BAL) >>> No yeast or fungal elements seen, culture in process 1/28 GAS (BAL) >>>>neg 1/28 Respiratory virus panel (BAL) >>>neg HIV neg  AUTOIMMUNE: Collected 1/28  p-ANCA>>>pos  c-ANCA >>> neg  Atypical p-ANCA>>>neg  Myeloperoxidase Abx >>> 1.2 (pos) Serine Protease 3 >>> <1.0 (neg) ESR >>> 98 ANA >>> pos C3 >>> 108 C4 >>> 22 RF >>> 13 Total Complement >>>56 dsDNA Ab >>> 1 CRP >>> 23.8  Cryoglobulin  >>>  ANTIBIOTICS: Home on levofloxacin 1/27  Vanc >>>1/30 1/27 Zosyn >>>1/30 1/30 ceftriaxone>>>2/3 plan stop 1/27 Azithromycin >>>1/29 1/28 tamiflu stopped 1/31  VITAL SIGNS: Temp:  [98 F (36.7 C)-98.7 F (37.1 C)] 98.3 F (36.8 C) (02/03 0426) Pulse Rate:  [48-75] 55 (02/03 0600) Resp:  [13-43] 20 (02/03 0600) BP: (80-164)/(49-116) 154/70 mmHg (02/03 0600) SpO2:  [94 %-100 %] 96 % (02/03 0600) Weight:  [118 lb 13.3 oz (53.9 kg)] 118 lb 13.3 oz (53.9 kg) (02/03 0445)    INTAKE / OUTPUT: Intake/Output     02/02 0701 - 02/03 0700   I.V. (mL/kg) 1118.4 (20.7)   IV Piggyback 925   Total Intake(mL/kg) 2043.4 (37.9)   Urine (mL/kg/hr) 3765 (2.9)   Total Output 3765   Net -1721.6       Stool Occurrence 1 x    SUBJECTIVE:  Potassium low and repleted. Received haldol overnight and precedex decreased, though nursing feel this is of little benefit.  PHYSICAL EXAMINATION: General: groaning, in restraints  HEENT: , mmm Cardiac: rrr, no rubs, murmurs or gallops Pulm: clear breath sounds, no wheezing Abd: soft, nontender, nondistended, hypoactive bowel sounds Ext: warm and well perfused Neuro: MAE x 4 agitated or sedated   LABS:  CBC  Recent Labs Lab 10/19/13 0626 10/20/13 0242 10/21/13 0222  WBC 8.5 11.4* 11.8*  HGB 7.9* 9.5* 11.3*  HCT 25.2* 29.5* 33.8*  PLT 373 412* 504*   BMET  Recent Labs Lab 10/19/13 0626 10/20/13 0242 10/21/13 0222  NA 146 149* 145  K 3.3* 3.0* 3.5*  CL 109 106 100  CO2 _0 BUN _1 CREATININE 0.53 0.60 0.66  GLUCOSE 124* 95 100*   Electrolytes  Recent Labs Lab 10/16/13 0409  10/19/13 0626 10/20/13 0242 10/21/13 0222  CALCIUM 7.4*  < > 8.0* 8.1* 8.7  MG 1.9  --  2.1 1.8  --   PHOS  --   --  2.1* 1.4*  --   < > = values in this interval not displayed. Sepsis Markers  Recent Labs Lab 10/14/13 1905 10/15/13 0120 10/16/13 0400  LATICACIDVEN 2.97*  --   --   PROCALCITON  --  2.19 2.51   ABG  Recent  Labs Lab 10/16/13 0909 10/17/13 0731 10/17/13 0952  PHART 7.304* 7.464* 7.324*  PCO2ART 48.3* 35.7 49.0*  PO2ART 68.0* 144.0* 105.0*   Liver Enzymes  Recent Labs Lab 10/14/13 1753 10/16/13 0409  AST 33 18  ALT 24 14  ALKPHOS 160* 128*  BILITOT 0.5 <0.2*  ALBUMIN 2.2* 1.5*   Cardiac Enzymes  Recent Labs Lab 10/15/13 0442 10/17/13 1517  TROPONINI 1.02* <0.30   Glucose  Recent Labs Lab 10/20/13 0730 10/20/13 1202 10/20/13 1524 10/20/13 1859 10/20/13 2353 10/21/13 0424  GLUCAP 87 72 87 82 82 96   Dg Chest Port 1 View  10/21/2013   CLINICAL DATA:  Cough and shortness of breath.  EXAM: PORTABLE CHEST - 1 VIEW  COMPARISON:  DG CHEST 1V PORT dated 10/20/2013; DG CHEST 1V PORT dated 10/19/2013; CT ANGIO CHEST W/CM &/OR WO/CM dated 10/15/2013  FINDINGS: Underlying mild hyperinflation. Midline trachea. Normal heart size. No pleural effusion or pneumothorax. Improved to resolved interstitial edema. Mild asymmetric pulmonary venous congestion suspected, primarily on the left. Patchy left-sided airspace disease is also slightly improved. Most apparent at the left lung base.  IMPRESSION: Improved interstitial and airspace disease. Primarily felt to represent pulmonary edema. Improving infection could look similar.   Electronically Signed   By: Abigail Miyamoto M.D.   On: 10/21/2013 07:08   Dg Chest Port 1 View  10/20/2013   CLINICAL DATA:  Shortness of breath after extubation  EXAM: PORTABLE CHEST - 1 VIEW  COMPARISON:  10/19/2013  FINDINGS: Interval removal of support apparatus. Interval increase in diffuse interstitial opacification. Background biapical infiltrates, assessed by previous CT imaging. Stable heart size and mediastinal contours. No pleural effusion or pneumothorax.  IMPRESSION: 1. Interval development of pulmonary edema. 2. Background biapical infiltrates, pneumonia based on CT 10/16/2013.   Electronically Signed   By: Jorje Guild M.D.   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?, autoimmune pneumonitis) (no biopsy) R/o pulm renal syndrome (had rbc in urine) NO DAH P:   Extubated 2/1 Solumedrol 30 q8h for treatment potential vasculitis, likley has benefited, will reduce 30 q12h Likely to require open lung BX with such borderline titers, MP and p ANCA - prior to this , would repeat ct chest for resolution Continue neg balance See ID  CARDIOVASCULAR A:  Hypotensive secondary to septic shock (resolved 1/31 off pressors) Mild PA pressures noted, echo unchanged from previous 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 P:  Tele remains needed on precedex, can dc once off Will need repeat echo for pa pressure sin month  RENAL Lab Results  Component Value Date   CREATININE 0.66 10/21/2013   CREATININE 0.60 10/20/2013   CREATININE 0.53 10/19/2013    A:   Hematuria  may be secondary to pulm renal syndrome Hypokalemia improved Hypernatremia improved Hypophosphatemia improved Autodiuresis P:   BMP in am  D5W @ 50/hr , likely to dc in am  Replete potassium as needed Mag and phos improved Allow autodiuresis Dc foley  GASTROINTESTINAL A:  Abdominal pain unclear etiology, from resp?, pt on bid Protonix home regimen, denies h/o ulcer, lipase negative, amylase neg, AST/ALT/ Bili wnl nonspecific findings on Abdominal U/S.  P:   Protonix , remain Swallow eval - slp to do today, may need psych evaluation Give diet and place on PO meds if passes swallow eval May need NGT and feeds started, psych eval first  HEMATOLOGIC  Recent Labs  10/20/13 0242 10/21/13 0222  HGB 9.5* 11.3*    A:  Anemia of chronic disease Leukocytosis increased from previous DVT ppx hemoconcentrating P:  Hgb increasing, cont to monitor Lovenox, follow creatine Ambulate if able  INFECTIOUS A:  HCAP and super infection Severe Sepsis   Pharyngitis -strep swab neg HIV neg P:   Cultures as above Ceftriaxone to stop today  ENDOCRINE A:   rel AI (cortisol 11.1) Likely sick euthyroid TSH 0.226, T3 39.4, free T4 0.69 P:   Cont to monitor Steroids, see pulm, continue to reduce, contributing to psychosis to q12h Repeat thyroid testing in 6 weeks  NEUROLOGIC A:   Seizure disorder, question of reported seizure no evidence on EEG Fibromyalgia- has this been vasculitis over time? Chronic pain AMS Delirium, likely steroid related  Conversion d/o? Not cooperating? P:   Keppra IV until patient able to take PO Sedation with precedex, goal is to wean off, not efective Repeat 12 lead without qtc prolongation  - less 500 Add Risperdal once able to take PO Haldol prn until risperdal added, add 4 mg iv q4h Soft restraints  Tommi Rumps, MD [place] PGY-2  Ccm time 30 min   Tele, back to imts  Lavon Paganini. Titus Mould, MD, [place] Pgr: [facility]

## 2013-10-21 NOTE — Progress Notes (Signed)
TRANSFER NOTE  Date: 10/21/2013               Patient Name:  [name] [name] MRN: [id number]  DOB: 01-Nov-1960 Age / Sex: 53 y.o., female   PCP: No Pcp Per Patient         Medical Service: Internal Medicine Teaching Service         Attending Physician: Dr. Josem Kaufmann    First Contact: Dr. Yetta Barre Pager: [phone]  Second Contact: Dr. Sherrine Maples Pager: [phone]       After Hours (After 5p/  First Contact Pager: [phone]  weekends / holidays): Second Contact Pager: [phone]   Chief Complaint: Transfer from ICU - admitted for PNA  Interim History: [name] [name] is a 53 yo woman with history of seizure disorder initially hospitalized 10/05/13-10/10/13 d/t CAP complicated by sepsis & hypoxia and treated with levaquin for 5d.  She was subsequently re-admitted on 10/14/13 with fever, chills, sore throat and "left lung" pain with a brief episode of syncope. While in the ED, she became hypoxic to 83-85% , and maintained O2 sats until 10/15/13 early morning, when she developed marked respiratory distress despite BiPap and was then intubated.  During her ICU stay, she was treated with Vanc/Zosyn from 1/27-1/30, Ceftriaxone 1/30-2/3, Azithro 1/27-1/29 and tamiflu 1/28-1/31.  Patient to transfer to IMTS on 10/22/13. Per ICU resident & RN, patient has not been speaking since extubation (not even to spanish speaking family) and refusing to eat (SLP worked with patient for 30-45 minutes today), and this is not her baseline.  ICU RN reported that patient's mother did mention something about patient seeing a psychiatrist today.   SIGNIFICANT EVENTS / STUDIES:  1/28 Abd [place] (due to hematuria) >>No hydronephrosis, Left kidney 8mm stone?, Right kidney 6mm stone? Mild intra and extrahepatic biliary ductal prominence,  1/28 CTA chest >>> No PE. Bilateral prominently upper lobe mixed airspace and ground-glass opacities, multi lobar pneumonia is favored. Mediastinal and bilateral hilar adenopathy  1/28 Bronchoscopy >>> unimpressive secretions,  no DAH, no hsv lesions, no mass lesions, BAL sent.  1/28 TTE >>> EF 65-70%, grade 1 DD, PA peak 38, no change from prior  1/28 CT head >>> Negative for acute process.  1/29 EEG >>> no evidence of seizure activity  2/1 Extubated, neg 5.6 liters  2/2 delirium, likely steroid psychosis    Meds: Current Facility-Administered Medications  Medication Dose Route Frequency Last Rate Last Dose  . antiseptic oral rinse (BIOTENE) solution 15 mL  15 mL Mouth Rinse QID   15 mL at 10/21/13 0400  . cefTRIAXone (ROCEPHIN) 1 g in dextrose 5 % 50 mL IVPB  1 g Intravenous Q24H   1 g at 10/21/13 1100  . chlorhexidine (PERIDEX) 0.12 % solution 15 mL  15 mL Mouth Rinse BID   15 mL at 10/21/13 0837  . dexmedetomidine (PRECEDEX) 200 MCG/50ML infusion  0-1.2 mcg/kg/hr Intravenous Continuous   0.328 mcg/kg/hr at 10/21/13 1000  . dextrose 5 % solution   Intravenous Continuous 50 mL/hr at 10/20/13 1615    . docusate sodium (COLACE) capsule 100 mg  100 mg Oral Daily PRN      . enoxaparin (LOVENOX) injection 40 mg  40 mg Subcutaneous Q24H   40 mg at 10/21/13 1040  . fentaNYL (SUBLIMAZE) injection 50-100 mcg  50-100 mcg Intravenous Q2H PRN   100 mcg at 10/21/13 1027  . folic acid (FOLVITE) tablet 1 mg  1 mg Per Tube Daily   1 mg at 10/19/13 1006  .  haloperidol (HALDOL) tablet 4 mg  4 mg Oral Q4H PRN       Or  . haloperidol lactate (HALDOL) injection 4 mg  4 mg Intramuscular Q4H PRN      . insulin aspart (novoLOG) injection 0-9 Units  0-9 Units Subcutaneous Q4H   1 Units at 10/19/13 0000  . levETIRAcetam (KEPPRA) 500 mg in sodium chloride 0.9 % 100 mL IVPB  500 mg Intravenous Q12H   500 mg at 10/21/13 0554  . methylPREDNISolone sodium succinate (SOLU-MEDROL) 40 mg/mL injection 30 mg  30 mg Intravenous Q12H      . pantoprazole (PROTONIX) injection 40 mg  40 mg Intravenous Q24H   40 mg at 10/20/13 1843  . polyethylene glycol (MIRALAX / GLYCOLAX) packet 17 g  17 g Oral Daily PRN      . pregabalin (LYRICA) capsule 300 mg   300 mg Oral BID   300 mg at 10/19/13 1005  . risperiDONE (RISPERDAL M-TABS) disintegrating tablet 0.5 mg  0.5 mg Oral BID   0.5 mg at 10/21/13 1027  . sodium chloride 0.9 % bolus 1,000 mL  1,000 mL Intravenous Once      . sodium chloride 0.9 % bolus 1,000 mL  1,000 mL Intravenous PRN        Allergies: Allergies as of 10/14/2013 - Review Complete 10/14/2013  Allergen Reaction Noted  . Shellfish allergy Anaphylaxis 10/05/2013   Past Medical History  Diagnosis Date  . Fibromyalgia   . Chronic pain   . Seizures   . Migraine   . Pneumonia    History reviewed. No pertinent past surgical history.  No family history on file.  History   Social History  . Marital Status: Single    Spouse Name: N/A    Number of Children: N/A  . Years of Education: N/A   Occupational History  . Not on file.   Social History Main Topics  . Smoking status: Never Smoker   . Smokeless tobacco: Never Used  . Alcohol Use: No  . Drug Use: No  . Sexual Activity: No   Other Topics Concern  . Not on file   Social History Narrative  . No narrative on file    Review of Systems: Unable to obtain due to mental status  Physical Exam: Blood pressure 135/68, pulse 65, temperature 98.3 F (36.8 C), temperature source Oral, resp. rate 18, height 5\' 1"  (1.549 m), weight 118 lb 13.3 oz (53.9 kg), SpO2 97.00%. General: fidgety, nonverbal, follows commands (stick tongue out), responds to pain/discomfort HEENT: PERRL, EOMI, no scleral icterusm MMM Cardiac: RRR, no rubs, murmurs or gallops Pulm: clear to auscultation bilaterally, moving normal volumes of air, no wheezing Abd: soft, nontender, nondistended, BS normoactive Ext: warm and well perfused, no pedal edema   Lab results: Basic Metabolic Panel:  Recent Labs  16/10/96 0242 10/21/13 0222  NA 149* 145  K 3.0* 3.5*  CL 106 100  CO2 30 25  GLUCOSE 95 100*  BUN 13 13  CREATININE 0.60 0.66  CALCIUM 8.1* 8.7  MG 1.8 2.6*  PHOS 1.4* 3.2  AG  20  CBC:  Recent Labs  10/20/13 0242 10/21/13 0222  WBC 11.4* 11.8*  HGB 9.5* 11.3*  HCT 29.5* 33.8*  MCV 87.0 85.1  PLT 412* 504*   CBG:  Recent Labs  10/20/13 1202 10/20/13 1524 10/20/13 1859 10/20/13 2353 10/21/13 0424 10/21/13 0806  GLUCAP 72 87 82 82 96 81   Urine Drug Screen: Drugs of Abuse  Component Value Date/Time   LABOPIA NONE DETECTED 10/07/2013 0018   COCAINSCRNUR NONE DETECTED 10/07/2013 0018   LABBENZ NONE DETECTED 10/07/2013 0018   AMPHETMU NONE DETECTED 10/07/2013 0018   THCU NONE DETECTED 10/07/2013 0018   LABBARB NONE DETECTED 10/07/2013 0018      Imaging results:  Dg Chest Port 1 View  10/21/2013   CLINICAL DATA:  Cough and shortness of breath.  EXAM: PORTABLE CHEST - 1 VIEW  COMPARISON:  DG CHEST 1V PORT dated 10/20/2013; DG CHEST 1V PORT dated 10/19/2013; CT ANGIO CHEST W/CM &/OR WO/CM dated 10/15/2013  FINDINGS: Underlying mild hyperinflation. Midline trachea. Normal heart size. No pleural effusion or pneumothorax. Improved to resolved interstitial edema. Mild asymmetric pulmonary venous congestion suspected, primarily on the left. Patchy left-sided airspace disease is also slightly improved. Most apparent at the left lung base.  IMPRESSION: Improved interstitial and airspace disease. Primarily felt to represent pulmonary edema. Improving infection could look similar.   Electronically Signed   By: Jeronimo GreavesKyle  Talbot M.D.   On: 10/21/2013 07:08   Dg Chest Port 1 View  10/20/2013   CLINICAL DATA:  Shortness of breath after extubation  EXAM: PORTABLE CHEST - 1 VIEW  COMPARISON:  10/19/2013  FINDINGS: Interval removal of support apparatus. Interval increase in diffuse interstitial opacification. Background biapical infiltrates, assessed by previous CT imaging. Stable heart size and mediastinal contours. No pleural effusion or pneumothorax.  IMPRESSION: 1. Interval development of pulmonary edema. 2. Background biapical infiltrates, pneumonia based on CT 10/16/2013.    Electronically Signed   By: Tiburcio PeaJonathan  Watts M.D.   On: 10/20/2013 06:08    Assessment & Plan by Problem: [name] [name] is a 53 yo woman with history of seizure disorder admitted on 10/14/13 with fever and hypoxia, and transferred out of the ICU on 10/21/13, with IMTS to take over 10/22/13.   #Acute hypoxic respiratory behavior secondary to PNA : extubated 2/1, pressors d/c 1/31, resolved, as of 10/21/13, will have completed abx course today (2/3).  Remains NPO as she did not participate with SLP. 1/18 Blood cultures >> Neg  1/27 UCx >>>> Neg  1/27 BCx >>>> NGTD  1/28 Strep culture >>>neg  1/28 MRSA >>> neg  1/28 Pneumocystis smear (BAL) >>> neg  1/28 AFB (BAL) >>>> smear neg, culture pending  1/28 Resp Culture (BAL) >>> NGTD  1/28 Fungus Culture (BAL) >>> No yeast or fungal elements seen, culture in process  1/28 GAS (BAL) >>>>neg  1/28 Respiratory virus panel (BAL) >>>neg  HIV neg  Plan:  -PCCM transferred pt to tele -Plan to repeat Chest CT for resolution of multilobar PNA in 4-6 weeks -Query autoimmune etiology -->-Cont solumedrol 30 q12h  #?Autoimmune/Rheumatic Disease: This was considered based on CT chest & elevated pulm artery pressures, and subsequent workup was initiated revealing pANCA positive, MPO pos (1.2), ANA positive (concern for  Granulomatosis with polyangiitis (autoimmune pneumonitis?), may require open lung bx given borderline titers) -follow cryoglobulin  #Delirium: Patient has been nonverbal and refusing to swallow since extubation, which is not patient's baseline per family.  RN reports there is question of esophageal stricture, causing her to be afraid of swallowing. There is question of psychiatric history per patient's mother. Precedex has been ineffective for agitation, and is now written for sublingual risperdal; may also consider ICU delerium vs steroid psychosis; question of conversion d/o? -Monitor out of ICU -Psychiatry consulted in ICU -Consider brain MRI if no  improvement  -Soft restraints  #Hematuria: Nephrolithiasis suggested on renal US, which may  also be contributing to abdominal pain. Hb stable (11.3 this AM)  #Increased AG: 13--> 20 today, not acidotic, likely starvation ketosis  -Cont D5W, may need to initiate tube feeds -Lactic acid  #Abdominal Pain: Unclear etiology, but may be the reason she is refusing PO, question of esophageal stricture?  Abd [place] suggestive of renal stones & possibly slight intrahepatic biliary ductal prominence; LFTs wnl 10/16/13 -May need to consider feeding tube if she doesn't take PO -AM CMET -May consider further imaging if no improvement   #Normocytic Anemia: Hb nadir at 7.9, but up to 11.3 today, likely related to critical illness -Monitor prn  #Seizure: question of reported seizure, abnormal EEG due to discontinuous nature but consistent with current med list, the sharp activity is not likely epileptiform in nature -Keppra  Dispo: Disposition is deferred at this time, awaiting improvement of current medical problems. Anticipated discharge in approximately 2-3 day(s).   The patient does not have a current PCP (No Pcp Per Patient) and may need an [facility] hospital follow-up appointment after discharge.  The patient does not know have transportation limitations that hinder transportation to clinic appointments.  Signed: Belia Heman, MD 10/21/2013, 11:27 AM

## 2013-10-22 ENCOUNTER — Inpatient Hospital Stay (HOSPITAL_COMMUNITY): Payer: Medicaid Other

## 2013-10-22 DIAGNOSIS — F4542 Pain disorder with related psychological factors: Secondary | ICD-10-CM

## 2013-10-22 DIAGNOSIS — J9600 Acute respiratory failure, unspecified whether with hypoxia or hypercapnia: Secondary | ICD-10-CM

## 2013-10-22 DIAGNOSIS — G894 Chronic pain syndrome: Secondary | ICD-10-CM

## 2013-10-22 DIAGNOSIS — R4182 Altered mental status, unspecified: Secondary | ICD-10-CM | POA: Diagnosis present

## 2013-10-22 LAB — COMPREHENSIVE METABOLIC PANEL
ALBUMIN: 2.2 g/dL — AB (ref 3.5–5.2)
ALK PHOS: 91 U/L (ref 39–117)
ALT: 86 U/L — ABNORMAL HIGH (ref 0–35)
AST: 37 U/L (ref 0–37)
BUN: 12 mg/dL (ref 6–23)
CO2: 23 mEq/L (ref 19–32)
Calcium: 8 mg/dL — ABNORMAL LOW (ref 8.4–10.5)
Chloride: 103 mEq/L (ref 96–112)
Creatinine, Ser: 0.71 mg/dL (ref 0.50–1.10)
GFR calc Af Amer: >90 mL/min (ref >90)
GFR calc non Af Amer: >90 mL/min (ref >90)
Glucose, Bld: 120 mg/dL — ABNORMAL HIGH (ref 70–99)
Potassium: 3.6 mEq/L — ABNORMAL LOW (ref 3.7–5.3)
Sodium: 141 mEq/L (ref 137–147)
TOTAL PROTEIN: 5.9 g/dL — AB (ref 6.0–8.3)
Total Bilirubin: 0.4 mg/dL (ref 0.3–1.2)

## 2013-10-22 LAB — CLOSTRIDIUM DIFFICILE BY PCR: Toxigenic C. Difficile by PCR: NEGATIVE

## 2013-10-22 LAB — CBC
HEMATOCRIT: 37.9 % (ref 36.0–46.0)
Hemoglobin: 12.3 g/dL (ref 12.0–15.0)
MCH: 28.1 pg (ref 26.0–34.0)
MCHC: 32.5 g/dL (ref 30.0–36.0)
MCV: 86.5 fL (ref 78.0–100.0)
PLATELETS: 362 10*3/uL (ref 150–400)
RBC: 4.38 MIL/uL (ref 3.87–5.11)
RDW: 16.9 % — ABNORMAL HIGH (ref 11.5–15.5)
WBC: 14.6 10*3/uL — AB (ref 4.0–10.5)

## 2013-10-22 LAB — GLUCOSE, CAPILLARY
GLUCOSE-CAPILLARY: 108 mg/dL — AB (ref 70–99)
GLUCOSE-CAPILLARY: 123 mg/dL — AB (ref 70–99)
GLUCOSE-CAPILLARY: 124 mg/dL — AB (ref 70–99)
GLUCOSE-CAPILLARY: 84 mg/dL (ref 70–99)
Glucose-Capillary: 114 mg/dL — ABNORMAL HIGH (ref 70–99)

## 2013-10-22 MED ORDER — ACETAMINOPHEN 325 MG PO TABS
650.0000 mg | ORAL_TABLET | Freq: Once | ORAL | Status: DC
Start: 2013-10-22 — End: 2013-10-22

## 2013-10-22 MED ORDER — METHYLPREDNISOLONE SODIUM SUCC 40 MG IJ SOLR
20.0000 mg | Freq: Two times a day (BID) | INTRAMUSCULAR | Status: DC
  Administered 2013-10-22 – 2013-10-24 (×5): 20 mg via INTRAVENOUS
  Filled 2013-10-22 (×6): qty 0.5

## 2013-10-22 MED ORDER — ACETAMINOPHEN 650 MG RE SUPP
650.0000 mg | Freq: Once | RECTAL | Status: AC
  Administered 2013-10-22: 650 mg via RECTAL
  Filled 2013-10-22: qty 1

## 2013-10-22 MED ORDER — POTASSIUM CHLORIDE 10 MEQ/100ML IV SOLN
10.0000 meq | INTRAVENOUS | Status: AC
  Administered 2013-10-22 (×2): 10 meq via INTRAVENOUS
  Filled 2013-10-22: qty 100

## 2013-10-22 NOTE — Procedures (Signed)
ELECTROENCEPHALOGRAM REPORT  Patient: [name] [name]       Room #: 2C14 EEG No. ID: [id number] Age: 53 y.o.        Sex: female Referring Physician: Daiva EvesVan Dam Report Date:  10/22/2013        Interpreting Physician: [name],[name]  History: [name] [name] is an 53 y.o. female history of seizure disorder who has been experiencing diffuse muscle contractions as well as altered mental status.  Indications for study:  Rule out seizure activity; assess severity of encephalopathy.  Technique: This is an 18 channel routine scalp EEG performed at the bedside with bipolar and monopolar montages arranged in accordance to the international 10/20 system of electrode placement.   Description: This EEG recording was performed at patient's bedside in the medical step down unit. Background activity consists of mixed delta and theta activity ordered from the frontal and central regions, as well as slightly faster activity in the 7-8 Hz range recorded from the posterior head region. No activity appeared to be somewhat rhythmic at times, especially in the frontal regions. Photic stimulation was not performed. Hyperventilation was not performed. No epileptiform discharges are recorded.  Interpretation: This EEG is abnormal with moderate generalized nonspecific slowing of cerebral activity. This pattern of slowing can be seen with metabolic as well as toxic and degenerative encephalopathies. No evidence of seizure activity was demonstrated.   Venetia MaxonR Kiri Hinderliter M.D. [facility] [phone]

## 2013-10-22 NOTE — Consult Note (Signed)
Reason for Consult: AMS  (history of fibromyalgia, seizure, Pneumonia and sepsis) Referring Physician: Raylene Miyamoto, MD  [name] [name] is an 53 y.o. female.  HPI: Patient was seen and chart reviewed and case discussed with staff RN and Dr. Titus Mould. Patient has been non verbal since extubated about two days ago and has generalized muscular contractions, questionable dyskinesias verses underlying seizure activity. She was awake, open her eyes and occasionally smiles and nods her head but mostly staring in space. She was not able to swallow her own secretions and needs frequent suctioning as per staff. She has no family members at bed side at this time.     Interval history: patient was transferred step down unit and has completed neurology consult and EEG. Patient has been non verbal and not swallowing. She has no apparent abnormal gestures or behaviors. Patient continue to nod as a response to verbal queries and occasionally smiling. Reportedly she needs oral suctions due to not swallowing her own fluids. Patient has family has been visiting daily. ROS: not completed due to her current mental status.   MSE: Patient was in step down bed with soft restraints to her bed and has IV fluids and antibiotics for infections. She was given risperidone M tab with limited response. Patient seeks like understanding because she is nodding head but not able to respond verbally or consistently. She has been shaking her legs and moving all her body parts, sometimes in agony. She does not seems like responding to hallucinations or delusions.   Past Medical History  Diagnosis Date  . Fibromyalgia   . Chronic pain   . Seizures   . Migraine   . Pneumonia     History reviewed. No pertinent past surgical history.  No family history on file.  Social History:  reports that she has never smoked. She has never used smokeless tobacco. She reports that she does not drink alcohol or use illicit drugs.  Allergies:   Allergies  Allergen Reactions  . Shellfish Allergy Anaphylaxis    Medications: I have reviewed the patient's current medications.  Results for orders placed during the hospital encounter of 10/14/13 (from the past 48 hour(s))  GLUCOSE, CAPILLARY     Status: None   Collection Time    10/20/13  6:59 PM      Result Value Range   Glucose-Capillary 82  70 - 99 mg/dL  GLUCOSE, CAPILLARY     Status: None   Collection Time    10/20/13 11:53 PM      Result Value Range   Glucose-Capillary 82  70 - 99 mg/dL  CBC     Status: Abnormal   Collection Time    10/21/13  2:22 AM      Result Value Range   WBC 11.8 (*) 4.0 - 10.5 K/uL   RBC 3.97  3.87 - 5.11 MIL/uL   Hemoglobin 11.3 (*) 12.0 - 15.0 g/dL   HCT 33.8 (*) 36.0 - 46.0 %   MCV 85.1  78.0 - 100.0 fL   MCH 28.5  26.0 - 34.0 pg   MCHC 33.4  30.0 - 36.0 g/dL   RDW 15.8 (*) 11.5 - 15.5 %   Platelets 504 (*) 150 - 400 K/uL  BASIC METABOLIC PANEL     Status: Abnormal   Collection Time    10/21/13  2:22 AM      Result Value Range   Sodium 145  137 - 147 mEq/L   Potassium 3.5 (*) 3.7 - 5.3  mEq/L   Chloride 100  96 - 112 mEq/L   CO2 25  19 - 32 mEq/L   Glucose, Bld 100 (*) 70 - 99 mg/dL   BUN 13  6 - 23 mg/dL   Creatinine, Ser 0.66  0.50 - 1.10 mg/dL   Calcium 8.7  8.4 - 10.5 mg/dL   GFR calc non Af Amer >90  >90 mL/min   GFR calc Af Amer >90  >90 mL/min   Comment: (NOTE)     The eGFR has been calculated using the CKD EPI equation.     This calculation has not been validated in all clinical situations.     eGFR's persistently <90 mL/min signify possible Chronic Kidney     Disease.  MAGNESIUM     Status: Abnormal   Collection Time    10/21/13  2:22 AM      Result Value Range   Magnesium 2.6 (*) 1.5 - 2.5 mg/dL  PHOSPHORUS     Status: None   Collection Time    10/21/13  2:22 AM      Result Value Range   Phosphorus 3.2  2.3 - 4.6 mg/dL  GLUCOSE, CAPILLARY     Status: None   Collection Time    10/21/13  4:24 AM      Result  Value Range   Glucose-Capillary 96  70 - 99 mg/dL   Comment 1 Documented in Chart     Comment 2 Notify RN    GLUCOSE, CAPILLARY     Status: None   Collection Time    10/21/13  8:06 AM      Result Value Range   Glucose-Capillary 81  70 - 99 mg/dL  GLUCOSE, CAPILLARY     Status: Abnormal   Collection Time    10/21/13 11:19 AM      Result Value Range   Glucose-Capillary 112 (*) 70 - 99 mg/dL  GLUCOSE, CAPILLARY     Status: None   Collection Time    10/21/13  3:17 PM      Result Value Range   Glucose-Capillary 90  70 - 99 mg/dL  LACTIC ACID, PLASMA     Status: Abnormal   Collection Time    10/21/13  5:06 PM      Result Value Range   Lactic Acid, Venous 2.3 (*) 0.5 - 2.2 mmol/L  GLUCOSE, CAPILLARY     Status: None   Collection Time    10/21/13  7:01 PM      Result Value Range   Glucose-Capillary 88  70 - 99 mg/dL  GLUCOSE, CAPILLARY     Status: Abnormal   Collection Time    10/21/13 11:30 PM      Result Value Range   Glucose-Capillary 124 (*) 70 - 99 mg/dL   Comment 1 Notify RN    COMPREHENSIVE METABOLIC PANEL     Status: Abnormal   Collection Time    10/22/13  2:34 AM      Result Value Range   Sodium 141  137 - 147 mEq/L   Potassium 3.6 (*) 3.7 - 5.3 mEq/L   Chloride 103  96 - 112 mEq/L   CO2 23  19 - 32 mEq/L   Glucose, Bld 120 (*) 70 - 99 mg/dL   BUN 12  6 - 23 mg/dL   Creatinine, Ser 0.71  0.50 - 1.10 mg/dL   Calcium 8.0 (*) 8.4 - 10.5 mg/dL   Total Protein 5.9 (*) 6.0 - 8.3 g/dL  Albumin 2.2 (*) 3.5 - 5.2 g/dL   AST 37  0 - 37 U/L   ALT 86 (*) 0 - 35 U/L   Alkaline Phosphatase 91  39 - 117 U/L   Total Bilirubin 0.4  0.3 - 1.2 mg/dL   GFR calc non Af Amer >90  >90 mL/min   GFR calc Af Amer >90  >90 mL/min   Comment: (NOTE)     The eGFR has been calculated using the CKD EPI equation.     This calculation has not been validated in all clinical situations.     eGFR's persistently <90 mL/min signify possible Chronic Kidney     Disease.  GLUCOSE, CAPILLARY      Status: Abnormal   Collection Time    10/22/13  3:38 AM      Result Value Range   Glucose-Capillary 108 (*) 70 - 99 mg/dL  CLOSTRIDIUM DIFFICILE BY PCR     Status: None   Collection Time    10/22/13 12:03 PM      Result Value Range   C difficile by pcr NEGATIVE  NEGATIVE  GLUCOSE, CAPILLARY     Status: Abnormal   Collection Time    10/22/13 12:43 PM      Result Value Range   Glucose-Capillary 123 (*) 70 - 99 mg/dL   Comment 1 Notify RN    CBC     Status: Abnormal   Collection Time    10/22/13  1:10 PM      Result Value Range   WBC 14.6 (*) 4.0 - 10.5 K/uL   RBC 4.38  3.87 - 5.11 MIL/uL   Hemoglobin 12.3  12.0 - 15.0 g/dL   HCT 37.9  36.0 - 46.0 %   MCV 86.5  78.0 - 100.0 fL   MCH 28.1  26.0 - 34.0 pg   MCHC 32.5  30.0 - 36.0 g/dL   RDW 16.9 (*) 11.5 - 15.5 %   Platelets 362  150 - 400 K/uL   Comment: PLATELET COUNT CONFIRMED BY SMEAR    Dg Chest Port 1 View  10/21/2013   CLINICAL DATA:  Cough and shortness of breath.  EXAM: PORTABLE CHEST - 1 VIEW  COMPARISON:  DG CHEST 1V PORT dated 10/20/2013; DG CHEST 1V PORT dated 10/19/2013; CT ANGIO CHEST W/CM &/OR WO/CM dated 10/15/2013  FINDINGS: Underlying mild hyperinflation. Midline trachea. Normal heart size. No pleural effusion or pneumothorax. Improved to resolved interstitial edema. Mild asymmetric pulmonary venous congestion suspected, primarily on the left. Patchy left-sided airspace disease is also slightly improved. Most apparent at the left lung base.  IMPRESSION: Improved interstitial and airspace disease. Primarily felt to represent pulmonary edema. Improving infection could look similar.   Electronically Signed   By: Abigail [name] M.D.   On: 10/21/2013 07:08    Positive for agitation and altered mental status. Blood pressure 140/88, pulse 68, temperature 99.2 F (37.3 C), temperature source Oral, resp. rate 20, height '5\' 1"'  (1.549 m), weight 53.7 kg (118 lb 6.2 oz), SpO2 100.00%.   Assessment/Plan: Delirium NOS, (questionable  sepsis and steroids role) Fibromyalgia and chronic pain syndrome Conversion disorder Vs seizure disorder  Recommendation:  Reviewed neurology consult and EEG findings and she has no seizure activity Continue Ativan 0.5 mg IV Q6hours for agitation Continue Risperidal M-tabs 1 mg PO BID/psychotic agitation Continue Cogentin 0.5 mg IV BID for EPS Appreciate for psych consult and will follow up as clinically required   Pranav Lince,JANARDHAHA R. 10/22/2013, 4:32 PM

## 2013-10-22 NOTE — Progress Notes (Signed)
10/22/2013  S: I saw [name] [name] today and upon entering the room, she was drooling and had bilateral ptosis.  She was drooling to the point that her entire gown was drenched with secretions. I assisted her with suctioning her secretions.  She responded to verbal commands somewhat but had difficulty.  O: Filed Vitals:   10/22/13 2218  BP:   Pulse:   Temp: 99 F (37.2 C)  Resp:    Physical Exam: Pt had difficulty cooperating with exam. Chest: CTAB Cardio: RRR Abd: Soft, +BS; NT/ND Neuro: Alert-responds to commands somewhat.  Limited exam d/t pt unable to cooperate.   Ext: bilateral non-pitting edema  A/P:  ?Neuromuscular Disorder - Pt has a presentation similar to MG given oculobulbar signs especially in the setting of neuromuscular blockade.   -consider d/c sedating medications -consider AChR antibody test -monitor closely

## 2013-10-22 NOTE — Progress Notes (Signed)
1215 Pt transferred to 2C14. Report given to Tobi BastosAnna, Charity fundraiserN. All questions answered at bedside. Pt VSS.

## 2013-10-22 NOTE — Evaluation (Signed)
Physical Therapy Evaluation Patient Details Name: [name] [name] MRN: [id number] DOB: 02/28/1961 Today's Date: 10/22/2013 Time: [id number] PT Time Calculation (min): 30 min  PT Assessment / Plan / Recommendation History of Present Illness  53 yo female with recent admission for CAP re-presents with fever, leukocytosis, chest pain, and hypoxia in setting of possible seizure vs pre-syncope. PMHx fibromyalgia with chronic pain  Clinical Impression  Pt admitted with pneumonia. After extubation, has been non-verbal and was throughout PT evaluation. Pt currently with functional limitations due to the deficits listed below (see PT Problem List). Noted pt was ambulatory with RW on recent discharge from hospital. Pt will benefit from skilled PT to increase their independence and safety with mobility to allow discharge to the venue listed below.       PT Assessment  Patient needs continued PT services    Follow Up Recommendations  Home health PT;Supervision/Assistance - 24 hour    Does the patient have the potential to tolerate intense rehabilitation      Barriers to Discharge        Equipment Recommendations   (TBA)    Recommendations for Other Services OT consult   Frequency Min 3X/week    Precautions / Restrictions Precautions Precautions: Fall Precaution Comments: per mother, pt with frequent falls PTA   Pertinent Vitals/Pain SaO2 >92% throughout BP stable supine to sit to stand (130s/70s)      Mobility  Bed Mobility Overal bed mobility: Needs Assistance;+2 for physical assistance Bed Mobility: Supine to Sit Supine to sit: Max assist;+2 for physical assistance;HOB elevated General bed mobility comments: slow to follow commands and required assist due to weakness Transfers Overall transfer level: Needs assistance Equipment used: 2 person hand held assist Transfers: Sit to/from UGI CorporationStand;Stand Pivot Transfers Sit to Stand: Mod assist;+2 physical assistance Stand pivot transfers: Mod  assist;+2 physical assistance Ambulation/Gait Ambulation/Gait assistance: Mod assist;+2 physical assistance Ambulation Distance (Feet): 2 Feet Assistive device: 2 person hand held assist Gait Pattern/deviations: Decreased stride length;Shuffle;Trunk flexed    Exercises     PT Diagnosis: Difficulty walking;Generalized weakness  PT Problem List: Decreased strength;Decreased activity tolerance;Decreased balance;Decreased mobility;Decreased knowledge of use of DME;Cardiopulmonary status limiting activity PT Treatment Interventions: DME instruction;Gait training;Stair training;Functional mobility training;Therapeutic activities;Therapeutic exercise;Balance training;Cognitive remediation;Patient/family education     PT Goals(Current goals can be found in the care plan section) Acute Rehab PT Goals Patient Stated Goal: pt unable/unwilling to state PT Goal Formulation: With patient Time For Goal Achievement: 10/29/13 Potential to Achieve Goals: Good  Visit Information  Last PT Received On: 10/22/13 Assistance Needed: +2 History of Present Illness: 53 yo female with recent admission for CAP re-presents with fever, leukocytosis, chest pain, and hypoxia in setting of possible seizure vs pre-syncope. PMHx fibromyalgia with chronic pain       Prior Functioning  Home Living Family/patient expects to be discharged to:: Private residence Living Arrangements: Parent;Other relatives Available Help at Discharge: Family;Available 24 hours/day Type of Home: House Home Access: Stairs to enter Entergy CorporationEntrance Stairs-Number of Steps: 1 Entrance Stairs-Rails: None Home Layout: One level Home Equipment: Cane - single point Prior Function Level of Independence: Needs assistance Gait / Transfers Assistance Needed: using cane PTA with frequent falls per mother Communication Communication: Expressive difficulties (not speaking x 2 days s/p extubation; shakes head yes/no )    Cognition   Cognition Arousal/Alertness: Lethargic Behavior During Therapy: Flat affect Overall Cognitive Status: Difficult to assess Difficult to assess due to:  (pt not speaking)    Extremity/Trunk Assessment Upper Extremity Assessment Upper  Extremity Assessment: Generalized weakness Lower Extremity Assessment Lower Extremity Assessment: RLE deficits/detail;LLE deficits/detail RLE Deficits / Details: edematous lower legs and feet; knee extension 3- LLE Deficits / Details: edematous lower legs and feet; knee extension 3- Cervical / Trunk Assessment Cervical / Trunk Assessment: Kyphotic   Balance Balance Overall balance assessment: Needs assistance Sitting-balance support: Feet supported Sitting balance-Leahy Scale: Poor Sitting balance - Comments: drifts posteriorly (and at times anteriorly) Standing balance support: Bilateral upper extremity supported Standing balance-Leahy Scale: Poor General Comments General comments (skin integrity, edema, etc.): Mother and female family member arrived at end of session. Pt brighter when they entered the room  End of Session PT - End of Session Equipment Utilized During Treatment: Gait belt;Oxygen Activity Tolerance: Patient limited by lethargy Patient left: in chair;with call bell/phone within reach;with family/visitor present;with restraints reapplied (bil wrists and bil mittens) Nurse Communication: Mobility status  GP     [name] [name] 10/22/2013, 9:49 AM Pager [phone]

## 2013-10-22 NOTE — Progress Notes (Signed)
EEG Completed; Results Pending  

## 2013-10-22 NOTE — Care Management Note (Addendum)
    Page 1 of 2   10/28/2013     12:33:08 PM   CARE MANAGEMENT NOTE 10/28/2013  Patient:  [name] [name],[name] [name]   Account Number:  [id number]  Date Initiated:  10/15/2013  Documentation initiated by:  [name]  Subjective/Objective Assessment:   fever, increased WBC, tachy and hypoxic.  Intitially on bipap then condition declined and was intubated.     Action/Plan:   Anticipated DC Date:  10/28/2013   Anticipated DC Plan:  HOME W HOME HEALTH SERVICES      DC Planning Services  CM consult  MATCH Program      Choice offered to / List presented to:             Status of service:  Completed, signed off Medicare Important Message given?   (If response is "NO", the following Medicare IM given date fields will be blank) Date Medicare IM given:   Date Additional Medicare IM given:    Discharge Disposition:  HOME/SELF CARE  Per UR Regulation:  Reviewed for med. necessity/level of care/duration of stay  If discussed at Long Length of Stay Meetings, dates discussed:    Comments:  ContactClerance Lav:  Figg,Sandra Sister [phone]   10/28/13 1146 Letha Capeeborah Keigen Caddell RN , BSN [phone] patient is for dc today, [facility] MD, and [facility].  NCM will ast patient with the Match Letter for meds today.  Patient states her sister will transport her home but she will be at work till 4:30 pm but sister will try to come earlier.  Patient showed me the letter stating that Medicaid app was denied. Patient states  she has a rolling walker at home.  Informed patient that Match Program does not cover narcotics.   10-22-13 11:20am Avie ArenasSarah [name], RNBSN [phone]- [phone] Mother and brother in room.  Prior to admission patient lived with mother who is with her 24/7.   PT recommending HH PT on discharge however from previous note on last admissionHome Health PT: not eligible with Medicaid [facility]  [facility]  10/30/2013 at 10:00 am Recieved DME: rolling walker w/wheels.  [facility] with referral to meet with patient .  10-20-13 2:54pm Avie ArenasSarah [name], RNBSN [phone]- [phone] Extubated yesterday - but agitated - on precedex drip - restrained.  Will need PT/OT when able.  10-15-13 11:35am Avie ArenasSarah [name], RNBSN [phone]- [phone] lives with sister and mother.  from previous admission this week recieved RW.

## 2013-10-22 NOTE — Progress Notes (Signed)
PULMONOLOGY/CRITICAL CARE   Name: [name] [name] MRN: [id number] DOB: [birth year]    ADMISSION DATE:  10/14/2013 CONSULTATION DATE: 10/14/13  REFERRING MD :  ED PRIMARY SERVICE: PCCM  CHIEF COMPLAINT:  fever  BRIEF PATIENT DESCRIPTION: 53 yo female with recent admission for CAP re-presents with fever, leukocytosis, chest pain, and hypoxia in setting of possible seizure vs pre-syncope.  SIGNIFICANT EVENTS / STUDIES:  1/28 Abd [place] >>No hydronephrosis, Left kidney 30m stone?, Right kidney 627mstone? Mild intra and extrahepatic biliary ductal prominence,  1/28 CTA chest >>> No PE.  Bilateral prominently upper lobe mixed airspace and ground-glass opacities, multi lobar pneumonia is favored.  Mediastinal and bilateral hilar adenopathy  1/28 Bronchoscopy >>> unimpressive secretions, no DAH, no hsv lesions, no mass lesions, BAL sent. 1/28 TTE >>> EF 65-70%, grade 1 DD, PA peak 38, no change from prior 1/28 CT head >>> Negative for acute process. 1/29 EEG >>> no evidence of seizure activity 1/31 2. 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 2/3 remains on precedex 2/4 off precedex overnight, improved orientation  LINES / TUBES: 1/28 LIJ >>> 2/1 (patient removed) 1/28 OETT >>>2/1 1/28 Foley >> 1/29 A line >>>out  CULTURES: 1/18 Blood cultures >> Neg 1/27 UCx >>>> Neg 1/27 BCx >>>> neg 1/28 Strep culture >>>neg 1/28 MRSA >>> neg 1/28 Pneumocystis smear (BAL) >>> neg 1/28 AFB (BAL) >>>> smear neg, culture pending 1/28 Resp Culture (BAL) >>> NGTD 1/28 Fungus Culture (BAL) >>> No yeast or fungal elements seen, culture in process 1/28 GAS (BAL) >>>>neg 1/28 Respiratory virus panel (BAL) >>>neg HIV neg  AUTOIMMUNE: Collected 1/28  p-ANCA>>>pos  c-ANCA >>> neg  Atypical p-ANCA>>>neg  Myeloperoxidase Abx >>> 1.2 (pos) Serine Protease 3 >>> <1.0 (neg) ESR >>> 98 ANA >>> pos C3 >>> 108 C4 >>> 22 RF >>> 13 Total Complement >>>56 dsDNA Ab >>>  1 CRP >>> 23.8  Cryoglobulin >>>none detected  ANTIBIOTICS: Home on levofloxacin 1/27  Vanc >>>1/30 1/27 Zosyn >>>1/30 1/30 ceftriaxone>>>2/3  1/27 Azithromycin >>>1/29 1/28 tamiflu stopped 1/31  VITAL SIGNS: Temp:  [97.4 F (36.3 C)-98.5 F (36.9 C)] 98.1 F (36.7 C) (02/04 0400) Pulse Rate:  [35-118] 51 (02/04 0500) Resp:  [16-35] 25 (02/04 0500) BP: (77-161)/(26-94) 130/71 mmHg (02/04 0500) SpO2:  [92 %-100 %] 100 % (02/04 0500) Weight:  [118 lb 6.2 oz (53.7 kg)] 118 lb 6.2 oz (53.7 kg) (02/04 0500)    INTAKE / OUTPUT: Intake/Output     02/03 0701 - 02/04 0700 02/04 0701 - 02/05 0700   I.V. (mL/kg) 1228 (22.9)    IV Piggyback 360    Total Intake(mL/kg) 1588 (29.6)    Urine (mL/kg/hr) 1740 (1.4)    Total Output 1740     Net -152          Stool Occurrence 3 x     SUBJECTIVE:  Potassium low and repleted. Patient placed back on precedex yesterday for agitation. Now off of this. Nursing reported that patient slept from 8:30 pm until this morning. States this is the first time she was able to sleep since coming in.  PHYSICAL EXAMINATION: General: comfortable, in restraints  HEENT: NCAT Cardiac: rrr, no rubs, murmurs or gallops Pulm: clear breath sounds, no wheezing Abd: soft, nontender, nondistended, hypoactive bowel sounds Ext: warm and well perfused Neuro: MAE x 4, oriented to person   LABS:  CBC  Recent Labs Lab 10/19/13 0626 10/20/13 0242 10/21/13 0222  WBC 8.5 11.4* 11.8*  HGB 7.9* 9.5* 11.3*  HCT 25.2* 29.5* 33.8*  PLT 373 412* 504*   BMET  Recent Labs Lab 10/20/13 0242 10/21/13 0222 10/22/13 0234  NA 149* 145 141  K 3.0* 3.5* 3.6*  CL 106 100 103  CO2 '30 25 23  ' BUN '13 13 12  ' CREATININE 0.60 0.66 0.71  GLUCOSE 95 100* 120*   Electrolytes  Recent Labs Lab 10/19/13 0626 10/20/13 0242 10/21/13 0222 10/22/13 0234  CALCIUM 8.0* 8.1* 8.7 8.0*  MG 2.1 1.8 2.6*  --   PHOS 2.1* 1.4* 3.2  --    Sepsis Markers  Recent Labs Lab  10/16/13 0400 10/21/13 1706  LATICACIDVEN  --  2.3*  PROCALCITON 2.51  --    ABG  Recent Labs Lab 10/16/13 0909 10/17/13 0731 10/17/13 0952  PHART 7.304* 7.464* 7.324*  PCO2ART 48.3* 35.7 49.0*  PO2ART 68.0* 144.0* 105.0*   Liver Enzymes  Recent Labs Lab 10/16/13 0409 10/22/13 0234  AST 18 37  ALT 14 86*  ALKPHOS 128* 91  BILITOT <0.2* 0.4  ALBUMIN 1.5* 2.2*   Cardiac Enzymes  Recent Labs Lab 10/17/13 1517  TROPONINI <0.30   Glucose  Recent Labs Lab 10/21/13 0806 10/21/13 1119 10/21/13 1517 10/21/13 1901 10/21/13 2330 10/22/13 0338  GLUCAP 81 112* 90 88 124* 108*   Dg Chest Port 1 View  10/21/2013   CLINICAL DATA:  Cough and shortness of breath.  EXAM: PORTABLE CHEST - 1 VIEW  COMPARISON:  DG CHEST 1V PORT dated 10/20/2013; DG CHEST 1V PORT dated 10/19/2013; CT ANGIO CHEST W/CM &/OR WO/CM dated 10/15/2013  FINDINGS: Underlying mild hyperinflation. Midline trachea. Normal heart size. No pleural effusion or pneumothorax. Improved to resolved interstitial edema. Mild asymmetric pulmonary venous congestion suspected, primarily on the left. Patchy left-sided airspace disease is also slightly improved. Most apparent at the left lung base.  IMPRESSION: Improved interstitial and airspace disease. Primarily felt to represent pulmonary edema. Improving infection could look similar.   Electronically Signed   By: Abigail Miyamoto M.D.   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?, autoimmune pneumonitis) (no biopsy) R/o pulm renal syndrome (had rbc in urine) NO DAH P:   Extubated 2/1 Solumedrol 30 q12 to reduce further to 20 q12, then leave at dose 1 mg/kg Likely to require open lung BX with such borderline titers, MP and p ANCA - prior to this , would repeat ct chest for resolution Continue neg balance See ID Suction as  needed  CARDIOVASCULAR A:  Hypotensive secondary to septic shock (resolved 1/31 off pressors) Mild PA pressures noted, echo unchanged from previous 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 P:  Will need repeat echo for pa pressure sin month Off precedex  RENAL Lab Results  Component Value Date   CREATININE 0.71 10/22/2013   CREATININE 0.66 10/21/2013   CREATININE 0.60 10/20/2013    A:   Hematuria may be secondary to pulm renal syndrome Hypokalemia  Autodiuresis P:   BMP in am  D/c D5W @ 50/hr, would place back on 1/2 NS @ 50/hr until able to take PO Replete potassium as needed Mag and phos improved Allow autodiuresis Still with foley, plan to dc as she is able to cooperate more with her care  GASTROINTESTINAL A:  Abdominal pain unclear etiology, from resp?, pt on bid Protonix home regimen, denies h/o ulcer, lipase negative, amylase neg, AST/ALT/  Bili wnl nonspecific findings on Abdominal U/S.  P:   Protonix continue Swallow eval would not cooperate, patient more alert this morning, will have this repeated and if fails will place NG tube for nutrition  HEMATOLOGIC  Recent Labs  10/20/13 0242 10/21/13 0222  HGB 9.5* 11.3*    A:  Anemia of chronic disease Leukocytosis DVT ppx hemoconcentrating P:  Continue to monitor Hgb Lovenox, follow creatine Ambulate if able  INFECTIOUS A:  HCAP and super infection Severe Sepsis  Pharyngitis -strep swab neg HIV neg P:   Cultures as above Ceftriaxone stopped yesterday  ENDOCRINE A:   rel AI (cortisol 11.1) Likely sick euthyroid TSH 0.226, T3 39.4, free T4 0.69 P:   Cont to monitor Steroids, see pulm, continue to reduce, contributing to psychosis to 20 q12h Repeat thyroid testing in 6 weeks  NEUROLOGIC A:   Seizure disorder, question of reported seizure no evidence on EEG Fibromyalgia- has this been vasculitis over time? Chronic pain AMS Delirium, likely steroid related  Conversion d/o? Not  cooperating? R/o cva  P:   Keppra IV until patient able to take PO Sedation with precedex, overnight, no weaned off risperdal 1 mg PO BID Ativan 0.5 IV q6 hr for agitation Cogentin 0.5 mg IV BID for EPS Psych consulted, requesting neuro consult, will call MRI utility? Consider EEG to evaluate for seizure activity Soft restraints  Tommi Rumps, MD [place] PGY-2  To sdu,  back to imts  Lavon Paganini. Titus Mould, MD, [place] Pgr: [facility]

## 2013-10-22 NOTE — Progress Notes (Signed)
[facility] ICU Electrolyte Replacement Protocol  Patient Name: [name] [name] DOB: 11-08-1960 MRN: [id number]  Date of Service  10/22/2013   HPI/Events of Note    Recent Labs Lab 10/16/13 0409  10/17/13 1517 10/19/13 0626 10/20/13 0242 10/21/13 0222 10/22/13 0234  NA 141  < > 139 146 149* 145 141  K 3.3*  < > 3.5* 3.3* 3.0* 3.5* 3.6*  CL 109  < > 105 109 106 100 103  CO2 21  < > '25 29 30 25 23  ' GLUCOSE 115*  < > 182* 124* 95 100* 120*  BUN 7  < > '13 17 13 13 12  ' CREATININE 0.64  < > 0.53 0.53 0.60 0.66 0.71  CALCIUM 7.4*  < > 8.1* 8.0* 8.1* 8.7 8.0*  MG 1.9  --   --  2.1 1.8 2.6*  --   PHOS  --   --   --  2.1* 1.4* 3.2  --   < > = values in this interval not displayed.  Estimated Creatinine Clearance: 62.8 ml/min (by C-G formula based on Cr of 0.71).  Intake/Output     02/03 0701 - 02/04 0700   I.V. (mL/kg) 1228 (22.9)   IV Piggyback 260   Total Intake(mL/kg) 1488 (27.7)   Urine (mL/kg/hr) 1615 (1.3)   Total Output 1615   Net -127       Stool Occurrence 3 x    - I/O DETAILED x24h    Total I/O In: 647.7 [I.V.:542.7; IV Piggyback:105] Out: 675 [Urine:675] - I/O THIS SHIFT    ASSESSMENT   eICURN Interventions  K+ 3.6 Electrolyte protocol criteria met. Value replaced per protocol. MD notified.   ASSESSMENT: MAJOR ELECTROLYTE    [name] [name] 10/22/2013, 5:32 AM

## 2013-10-22 NOTE — Progress Notes (Signed)
Speech Language Pathology Treatment: Dysphagia  Patient Details Name: [name] [name] MRN: [id number] DOB: 08/18/1961 Today's Date: 10/22/2013 Time: [id number] SLP Time Calculation (min): 22 min  Assessment / Plan / Recommendation Clinical Impression  Pt did not make any progress with swallowing today despite max cues to total assist. SLP could not elicit a voluntary swallow response. Pt also appears to hold saliva in her mouth, sometimes gagging her and requiring suction. She is known to have an esophageal dysphagia at baseline and is suspected to have some anxiety related to swallowing now. She is capable of swallowing, but refuses attempts.    HPI HPI: 53 yo female with recent admission for CAP re-presents with fever, leukocytosis, chest pain, and hypoxia in setting of possible seizure vs pre-syncope. Intubated from 1/27 to 2/1.   Pertinent Vitals NA  SLP Plan       Recommendations Diet recommendations: NPO (ok to try ice chips) Medication Administration: Crushed with puree              General recommendations: Rehab consult Oral Care Recommendations: Oral care Q4 per protocol Follow up Recommendations: Inpatient Rehab    GO    [facility] LeBonnie Symia [name], [place] CCC-SLP [phone]  Claudine MoutonDeBlois, [name] [name] 10/22/2013, 8:54 AM

## 2013-10-22 NOTE — Consult Note (Signed)
Reason for Consult: Speech and swallowing changes, rule out seizure activity.  HPI:                                                                                                                                          [name] [name] is an 53 y.o. female with a history of fibromyalgia, chronic pain syndrome, remote history of seizure disorder, migraine headaches and recent community-acquired pneumonia, admitted on 10/14/2013 4 increasing respiratory difficulty. Patient eventually required intubation. She was intubated and on mechanical ventilation from 10/15/2013 to 10/19/2013. Since extubation patient has not been speaking and as indicated difficulty with swallowing, including own secretions. She's been evaluated by speech and language pathology who feels the patient has capability of swallowing but this will fully not swallowing. She also has been noted to speak from time to time but has indicated inability to speak. She's been noted to have a tremor involving her right leg and foot. No frank seizure activity has been reported. Patient visually tracks movement and also responds to simple commands. She also occasionally will smile. She has not shown signs of regression respiratory functioning since extubation. She has no known neuromuscular disorder.  Past Medical History  Diagnosis Date  . Fibromyalgia   . Chronic pain   . Seizures   . Migraine   . Pneumonia     History reviewed. No pertinent past surgical history.  No family history on file.  Social History:  reports that she has never smoked. She has never used smokeless tobacco. She reports that she does not drink alcohol or use illicit drugs.  Allergies  Allergen Reactions  . Shellfish Allergy Anaphylaxis    MEDICATIONS:                                                                                                                     I have reviewed the patient's current medications.   ROS:  History obtained from chart review  General ROS: negative for - chills, fatigue, fever, night sweats, weight gain or weight loss Psychological ROS: See present illness Ophthalmic ROS: negative for - blurry vision, double vision, eye pain or loss of vision ENT ROS: negative for - epistaxis, nasal discharge, oral lesions, sore throat, tinnitus or vertigo Allergy and Immunology ROS: negative for - hives or itchy/watery eyes Hematological and Lymphatic ROS: negative for - bleeding problems, bruising or swollen lymph nodes Endocrine ROS: negative for - galactorrhea, hair pattern changes, polydipsia/polyuria or temperature intolerance Respiratory ROS: See history of present illness Cardiovascular ROS: negative for - chest pain, dyspnea on exertion, edema or irregular heartbeat Gastrointestinal ROS: negative for - abdominal pain, diarrhea, hematemesis, nausea/vomiting or stool incontinence Genito-Urinary ROS: negative for - dysuria, hematuria, incontinence or urinary frequency/urgency Musculoskeletal ROS: negative for - joint swelling or muscular weakness Neurological ROS: as noted in HPI Dermatological ROS: negative for rash and skin lesion changes   Blood pressure 140/88, pulse 68, temperature 99.2 F (37.3 C), temperature source Oral, resp. rate 20, height '5\' 1"'  (1.549 m), weight 53.7 kg (118 lb 6.2 oz), SpO2 100.00%.   Neurologic Examination:                                                                                                      Patient was alert and in no acute distress. She was able to follow simple commands, for the most part. Movements were very slow, however. Pupils were equal and reacted normally to light. Extraocular movements were full and conjugate Visual fields were intact bilaterally. No facial weakness was noted. Patient had no speech output including no  phonation. Palatal movement was normal and symmetrical. Tongue protrusion was midline with no signs of atrophy. Patient was only moderately cooperative with motor exam. She was able to move all extremities although effort was poor. Muscle tone appeared to be increased involving right upper extremity. Gen. resting tremor involving right leg and foot. Deep tendon reflexes are 2+ and brisk in upper extremities and 2+ at the knees. Plantar responses were mute bilaterally. Patient was not corporative enough for reliable sensory testing.  No results found for this basename: cbc, bmp, coags, chol, tri, ldl, hga1c    Results for orders placed during the hospital encounter of 10/14/13 (from the past 48 hour(s))  GLUCOSE, CAPILLARY     Status: None   Collection Time    10/20/13  6:59 PM      Result Value Range   Glucose-Capillary 82  70 - 99 mg/dL  GLUCOSE, CAPILLARY     Status: None   Collection Time    10/20/13 11:53 PM      Result Value Range   Glucose-Capillary 82  70 - 99 mg/dL  CBC     Status: Abnormal   Collection Time    10/21/13  2:22 AM      Result Value Range   WBC 11.8 (*) 4.0 - 10.5 K/uL   RBC 3.97  3.87 - 5.11 MIL/uL   Hemoglobin 11.3 (*) 12.0 - 15.0 g/dL  HCT 33.8 (*) 36.0 - 46.0 %   MCV 85.1  78.0 - 100.0 fL   MCH 28.5  26.0 - 34.0 pg   MCHC 33.4  30.0 - 36.0 g/dL   RDW 15.8 (*) 11.5 - 15.5 %   Platelets 504 (*) 150 - 400 K/uL  BASIC METABOLIC PANEL     Status: Abnormal   Collection Time    10/21/13  2:22 AM      Result Value Range   Sodium 145  137 - 147 mEq/L   Potassium 3.5 (*) 3.7 - 5.3 mEq/L   Chloride 100  96 - 112 mEq/L   CO2 25  19 - 32 mEq/L   Glucose, Bld 100 (*) 70 - 99 mg/dL   BUN 13  6 - 23 mg/dL   Creatinine, Ser 0.66  0.50 - 1.10 mg/dL   Calcium 8.7  8.4 - 10.5 mg/dL   GFR calc non Af Amer >90  >90 mL/min   GFR calc Af Amer >90  >90 mL/min   Comment: (NOTE)     The eGFR has been calculated using the CKD EPI equation.     This calculation has  not been validated in all clinical situations.     eGFR's persistently <90 mL/min signify possible Chronic Kidney     Disease.  MAGNESIUM     Status: Abnormal   Collection Time    10/21/13  2:22 AM      Result Value Range   Magnesium 2.6 (*) 1.5 - 2.5 mg/dL  PHOSPHORUS     Status: None   Collection Time    10/21/13  2:22 AM      Result Value Range   Phosphorus 3.2  2.3 - 4.6 mg/dL  GLUCOSE, CAPILLARY     Status: None   Collection Time    10/21/13  4:24 AM      Result Value Range   Glucose-Capillary 96  70 - 99 mg/dL   Comment 1 Documented in Chart     Comment 2 Notify RN    GLUCOSE, CAPILLARY     Status: None   Collection Time    10/21/13  8:06 AM      Result Value Range   Glucose-Capillary 81  70 - 99 mg/dL  GLUCOSE, CAPILLARY     Status: Abnormal   Collection Time    10/21/13 11:19 AM      Result Value Range   Glucose-Capillary 112 (*) 70 - 99 mg/dL  GLUCOSE, CAPILLARY     Status: None   Collection Time    10/21/13  3:17 PM      Result Value Range   Glucose-Capillary 90  70 - 99 mg/dL  LACTIC ACID, PLASMA     Status: Abnormal   Collection Time    10/21/13  5:06 PM      Result Value Range   Lactic Acid, Venous 2.3 (*) 0.5 - 2.2 mmol/L  GLUCOSE, CAPILLARY     Status: None   Collection Time    10/21/13  7:01 PM      Result Value Range   Glucose-Capillary 88  70 - 99 mg/dL  GLUCOSE, CAPILLARY     Status: Abnormal   Collection Time    10/21/13 11:30 PM      Result Value Range   Glucose-Capillary 124 (*) 70 - 99 mg/dL   Comment 1 Notify RN    COMPREHENSIVE METABOLIC PANEL     Status: Abnormal   Collection Time  10/22/13  2:34 AM      Result Value Range   Sodium 141  137 - 147 mEq/L   Potassium 3.6 (*) 3.7 - 5.3 mEq/L   Chloride 103  96 - 112 mEq/L   CO2 23  19 - 32 mEq/L   Glucose, Bld 120 (*) 70 - 99 mg/dL   BUN 12  6 - 23 mg/dL   Creatinine, Ser 0.71  0.50 - 1.10 mg/dL   Calcium 8.0 (*) 8.4 - 10.5 mg/dL   Total Protein 5.9 (*) 6.0 - 8.3 g/dL   Albumin  2.2 (*) 3.5 - 5.2 g/dL   AST 37  0 - 37 U/L   ALT 86 (*) 0 - 35 U/L   Alkaline Phosphatase 91  39 - 117 U/L   Total Bilirubin 0.4  0.3 - 1.2 mg/dL   GFR calc non Af Amer >90  >90 mL/min   GFR calc Af Amer >90  >90 mL/min   Comment: (NOTE)     The eGFR has been calculated using the CKD EPI equation.     This calculation has not been validated in all clinical situations.     eGFR's persistently <90 mL/min signify possible Chronic Kidney     Disease.  GLUCOSE, CAPILLARY     Status: Abnormal   Collection Time    10/22/13  3:38 AM      Result Value Range   Glucose-Capillary 108 (*) 70 - 99 mg/dL  CLOSTRIDIUM DIFFICILE BY PCR     Status: None   Collection Time    10/22/13 12:03 PM      Result Value Range   C difficile by pcr NEGATIVE  NEGATIVE  GLUCOSE, CAPILLARY     Status: Abnormal   Collection Time    10/22/13 12:43 PM      Result Value Range   Glucose-Capillary 123 (*) 70 - 99 mg/dL   Comment 1 Notify RN    CBC     Status: Abnormal   Collection Time    10/22/13  1:10 PM      Result Value Range   WBC 14.6 (*) 4.0 - 10.5 K/uL   RBC 4.38  3.87 - 5.11 MIL/uL   Hemoglobin 12.3  12.0 - 15.0 g/dL   HCT 37.9  36.0 - 46.0 %   MCV 86.5  78.0 - 100.0 fL   MCH 28.1  26.0 - 34.0 pg   MCHC 32.5  30.0 - 36.0 g/dL   RDW 16.9 (*) 11.5 - 15.5 %   Platelets 362  150 - 400 K/uL   Comment: PLATELET COUNT CONFIRMED BY SMEAR    Dg Chest Port 1 View  10/21/2013   CLINICAL DATA:  Cough and shortness of breath.  EXAM: PORTABLE CHEST - 1 VIEW  COMPARISON:  DG CHEST 1V PORT dated 10/20/2013; DG CHEST 1V PORT dated 10/19/2013; CT ANGIO CHEST W/CM &/OR WO/CM dated 10/15/2013  FINDINGS: Underlying mild hyperinflation. Midline trachea. Normal heart size. No pleural effusion or pneumothorax. Improved to resolved interstitial edema. Mild asymmetric pulmonary venous congestion suspected, primarily on the left. Patchy left-sided airspace disease is also slightly improved. Most apparent at the left lung base.   IMPRESSION: Improved interstitial and airspace disease. Primarily felt to represent pulmonary edema. Improving infection could look similar.   Electronically Signed   By: Abigail Miyamoto M.D.   On: 10/21/2013 07:08   Assessment/Plan: 1. Etiology for speech abnormality and swallowing difficulty is unclear. There appears to be significant psychophysiologic factors involved  in patient's lack of speech output and dysphagia. Progressive neuromuscular disorder is less likely but cannot be completely ruled out. 2. Resting tremor of right leg and foot along with increase muscle tone in right upper extremity or indications of possible early Parkinson's disease. 3. No indication of seizure activity clinically. Patient had an EEG earlier today which showed moderate generalized nonspecific slowing, but was otherwise unremarkable.  Recommendations: 1. Modified barium swallow study by SLP service. 2. FVC and NIF determinations 3. No indication for anticonvulsant medication. 4. Continued psychiatric intervention.  We will continue to follow this patient with you.  C.R. Nicole Kindred, MD [facility] [phone]  10/22/2013, 6:03 PM

## 2013-10-23 LAB — GLUCOSE, CAPILLARY
GLUCOSE-CAPILLARY: 85 mg/dL (ref 70–99)
GLUCOSE-CAPILLARY: 107 mg/dL — AB (ref 70–99)
GLUCOSE-CAPILLARY: 105 mg/dL — AB (ref 70–99)
Glucose-Capillary: 107 mg/dL — ABNORMAL HIGH (ref 70–99)
Glucose-Capillary: 113 mg/dL — ABNORMAL HIGH (ref 70–99)
Glucose-Capillary: 124 mg/dL — ABNORMAL HIGH (ref 70–99)
Glucose-Capillary: 128 mg/dL — ABNORMAL HIGH (ref 70–99)

## 2013-10-23 LAB — URINE CULTURE
COLONY COUNT: NO GROWTH
CULTURE: NO GROWTH

## 2013-10-23 LAB — WET PREP, GENITAL
CLUE CELLS WET PREP: NONE SEEN
Trich, Wet Prep: NONE SEEN
Yeast Wet Prep HPF POC: NONE SEEN

## 2013-10-23 MED ORDER — ACETAMINOPHEN 650 MG RE SUPP
650.0000 mg | Freq: Once | RECTAL | Status: AC
  Administered 2013-10-23: 650 mg via RECTAL
  Filled 2013-10-23: qty 1

## 2013-10-23 MED ORDER — PANTOPRAZOLE SODIUM 40 MG PO TBEC
40.0000 mg | DELAYED_RELEASE_TABLET | Freq: Every day | ORAL | Status: DC
  Administered 2013-10-23 – 2013-10-27 (×5): 40 mg via ORAL
  Filled 2013-10-23 (×5): qty 1

## 2013-10-23 MED ORDER — LEVETIRACETAM 500 MG PO TABS
500.0000 mg | ORAL_TABLET | Freq: Two times a day (BID) | ORAL | Status: DC
  Administered 2013-10-23 – 2013-10-28 (×10): 500 mg via ORAL
  Filled 2013-10-23 (×11): qty 1

## 2013-10-23 MED ORDER — ACYCLOVIR 400 MG PO TABS
400.0000 mg | ORAL_TABLET | Freq: Three times a day (TID) | ORAL | Status: DC
  Administered 2013-10-23 – 2013-10-24 (×2): 400 mg via ORAL
  Filled 2013-10-23 (×5): qty 1

## 2013-10-23 NOTE — Progress Notes (Signed)
Pt noted with thick green viscious discharge, unable to determine if coming from vaginal area or buttocks. Dr. Mariea ClontsEmokpae made aware.

## 2013-10-23 NOTE — Progress Notes (Signed)
Speech Language Pathology Treatment: Dysphagia  Patient Details Name: [name] [name] MRN: [id number] DOB: 06/04/1961 Today's Date: 10/23/2013 Time: [id number] SLP Time Calculation (min): 15 min  Assessment / Plan / Recommendation Clinical Impression  Pt demonstrating significant improvement in attention and initiation today, pt verbalizing appropriately, asking questions regarding length of stay and giving information regarding her medical history. She accepted 3 sips of sprite but does report pain with swallowing. She says she is worried about not urinating, SLP showed her her catheter with urine present. She also worried that she could not drink much because of history of kidney problems. Again, she is able to swallow and is now accepting PO, but intake is limited. Will start Dys 1/thin liquids and continue to encourage PO intake. Expect continued improvement. Further testing not necessary as aspiration is not a major concern.    HPI HPI: 53 yo female with recent admission for CAP re-presents with fever, leukocytosis, chest pain, and hypoxia in setting of possible seizure vs pre-syncope. Intubated from 1/27 to 2/1.   Pertinent Vitals NA  SLP Plan  Continue with current plan of care    Recommendations Diet recommendations: Dysphagia 1 (puree);Thin liquid Liquids provided via: Cup;Straw Medication Administration: Crushed with puree Supervision: Staff to assist with self feeding Compensations: Slow rate;Small sips/bites;Follow solids with liquid Postural Changes and/or Swallow Maneuvers: Seated upright 90 degrees              Plan: Continue with current plan of care    GO    Banner Heart HospitalBonnie Zykerria Tanton, MA CCC-SLP [phone]  Claudine MoutonDeBlois, [name] [name] 10/23/2013, 10:38 AM

## 2013-10-23 NOTE — Progress Notes (Addendum)
Pt is stating she has pain all over body rating a 8-9/10 on pain scale. Giving iv pain medication per md orders. Will monitor. Pt's brother is in to visit. Pt has been talking with him and he states pt is more herself today. He speaks english as well.

## 2013-10-23 NOTE — Progress Notes (Signed)
  Date: 10/23/2013  Patient name: [name] [name]  Medical record number: [id number]  Date of birth: 12/19/1960   I have seen and evaluated [name] [name] and discussed their care with the Residency Team.   Assessment and Plan: I have seen and evaluated the patient as outlined above. I agree with the formulated Assessment and Plan as detailed in the residents' admission note, with the following changes:   Fascinating case of 53 -year-old Hispanic lady who carried a diagnosis of fibromyalgia and seizure disorder who been recently admitted to the teaching service with him he acquired pneumonia treated with antibiotics and discharged to home then readmitted, with fever chest pain and hypoxia. Her respirations status declined rapidly and she required intubation and was cared for the intensive care unit. She was treated broad-spectrum antibiotics including vancomycin and Zosyn Tamiflu azithromycin.  She had CT of the chest which showed groundglass opacities and hilar lymphadenopathy was rather unusual for a pyogenic pneumonia.  She underwent bronchoscopy with bronchoalveolar lavage performed on January 28 roughly 24 hours into her inpatient admission. Bronchoscopy failed to show any evidence of any purulent airway secretions or lesions. Cultures taken from blood urine and from the bronchoalveolar lavage of all been negative to date AFB and fungal stains have been negative respiratory virus panel was also negative for bronchial V. Lavage. She was also placed on high-dose corticosteroids to treat possible vasculitis and connective tissue disease. Of note her sedimentation rate was markedly elevated as was her C-reactive protein and an ANA which was positive at a titer of 1-80. Her p-ANCA was weakly positive at a titer of 1-20.  Post extubation she is suffered from confusion and delirium and been given antipsychotics, benzodiazepenes and placed in restraints.  She was apparently non verbal for much of  yesterday.  She is being followed closely by neurology and also psychiatry.  This morning she seems markedly improved based on my exam compared to what the chart documents yesterday. She is able to answer several questions and is alert andfollowing commands. She does not have any focal deficits on my exam today. Her lungs are fairly clear on exam and benign cardiovascular exam. She did have some ulcerations at about her labia that look consistent with herpes type II.  I suspect that much of the current pathology could be unified by a connective tissue disease diagnosis.  I agree with continuing corticosteroids to treat her lung disease and possibly central nervous system disease if she had a lupus cerebritis for example.  I do not think she is suffering from any type of bacterial or pyogenic pneumonia. I do not think she has evidence of an infectious encephalitis.  Continue further supportive therapy and try to minimize sedation and possible suspect we may have to use antipsychotics or anxiolytics again if she becomes agitated. We will move her Foley catheter today as well.  I think an MRI of the brain with and without contrast might be very informative.  Is tested negative for HIV twice the last several months one could contemplate getting an HIV RNA to rule out acute HIV infection although this is not high on my differential presently.  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

## 2013-10-23 NOTE — Progress Notes (Signed)
Subjective:   Pt transfer from the ICU for severe sepsis secondary to CAP.  Upon extubation, she was reportedly agitated and combative.  However, I saw her yesterday and she was exhibiting no signs of agitation but did have ptosis and having increased secretions unable to swallow.  She looks much better today compared with yesterday and is more responsive.  She has less ptosis and no secretions.  Pt also spiked a fever last PM and BCx were drawn and tylenol given.   Objective:   Vital signs in last 24 hours: Filed Vitals:   10/23/13 1251 10/23/13 1300 10/23/13 1400 10/23/13 1546  BP: 152/104 155/82 163/83 115/65  Pulse: 73 75 79 71  Temp: 98.8 F (37.1 C)   98.7 F (37.1 C)  TempSrc: Oral   Oral  Resp: 38 32 25 33  Height:      Weight:      SpO2: 100% 100% 99% 100%   Weight change: 10.6 oz (0.3 kg)  Intake/Output Summary (Last 24 hours) at 10/23/13 1604 Last data filed at 10/23/13 1300  Gross per 24 hour  Intake   1255 ml  Output    851 ml  Net    404 ml    Physical Exam: Constitutional: Vital signs reviewed.  Patient is in no acute distress and cooperative with exam.   Head: Normocephalic and atraumatic Eyes: Ptosis b/l; EOMI, conjunctivae normal, no scleral icterus  Neck: Supple, Trachea midline Cardiovascular: RRR, S1, S2 present, no MRG, DP 2+ b/l,  Pulmonary/Chest: normal respiratory effort, CTAB, no wheezes, rales, or rhonchi Abdominal: Soft. +BS, Diffuse tenderness; non-distended Neurological: More alert and responsive and able to follow commands; cranial nerves II-XII are grossly intact, moving all extremities  GU: bilateral labial lesions with open sores  Skin: Warm, dry and intact.  Psychiatric: Normal mood and affect.   Lab Results:  BMP:  Recent Labs Lab 10/20/13 0242 10/21/13 0222 10/22/13 0234  NA 149* 145 141  K 3.0* 3.5* 3.6*  CL 106 100 103  CO2 '30 25 23  ' GLUCOSE 95 100* 120*  BUN '13 13 12  ' CREATININE 0.60 0.66 0.71  CALCIUM 8.1* 8.7  8.0*  MG 1.8 2.6*  --   PHOS 1.4* 3.2  --     CBC:  Recent Labs Lab 10/21/13 0222 10/22/13 1310  WBC 11.8* 14.6*  HGB 11.3* 12.3  HCT 33.8* 37.9  MCV 85.1 86.5  PLT 504* 362    Coagulation: No results found for this basename: LABPROT, INR,  in the last 168 hours  CBG:            Recent Labs Lab 10/22/13 0746 10/22/13 1243 10/22/13 1822 10/22/13 1957 10/23/13 0053 10/23/13 0528  GLUCAP 85 123* 114* 84 107* 107*           HA1C:      No results found for this basename: HGBA1C,  in the last 168 hours  Lipid Panel: No results found for this basename: CHOL, HDL, LDLCALC, TRIG, CHOLHDL, LDLDIRECT,  in the last 168 hours  LFTs:  Recent Labs Lab 10/22/13 0234  AST 37  ALT 86*  ALKPHOS 91  BILITOT 0.4  PROT 5.9*  ALBUMIN 2.2*    Pancreatic Enzymes: No results found for this basename: LIPASE, AMYLASE,  in the last 168 hours  Ammonia: No results found for this basename: AMMONIA,  in the last 168 hours  Cardiac Enzymes:  Recent Labs Lab 10/17/13 1517  TROPONINI <0.30   Lab Results  Component Value Date   TROPONINI <0.30 10/17/2013    EKG:  Date/Time:    Ventricular Rate:    PR Interval:    QRS Duration:   QT Interval:    QTC Calculation:   R Axis:     Text Interpretation:          BNP: No results found for this basename: PROBNP,  in the last 168 hours  D-Dimer: No results found for this basename: DDIMER,  in the last 168 hours  Urinalysis: No results found for this basename: COLORURINE, [place], LABSPEC, [place], GLUCOSEU, HGBUR, BILIRUBINUR, KETONESUR, [place], UROBILINOGEN, NITRITE, LEUKOCYTESUR,  in the last 168 hours  Micro Results: Recent Results (from the past 240 hour(s))  CULTURE, BLOOD (ROUTINE X 2)     Status: None   Collection Time    10/14/13  6:40 PM      Result Value Range Status   Specimen Description BLOOD LEFT ARM   Final   Special Requests BOTTLES DRAWN AEROBIC ONLY 4CC   Final   Culture  Setup Time      Final   Value: 10/15/2013 01:57     Performed at [facility]     Final   Value: NO GROWTH 5 DAYS     Performed at [facility]   Report Status 10/21/2013 FINAL   Final  CULTURE, BLOOD (ROUTINE X 2)     Status: None   Collection Time    10/14/13  6:45 PM      Result Value Range Status   Specimen Description BLOOD LEFT HAND   Final   Special Requests BOTTLES DRAWN AEROBIC ONLY 5CC   Final   Culture  Setup Time     Final   Value: 10/15/2013 01:57     Performed at [facility]   Culture     Final   Value: NO GROWTH 5 DAYS     Performed at [facility]   Report Status 10/21/2013 FINAL   Final  URINE CULTURE     Status: None   Collection Time    10/14/13  8:56 PM      Result Value Range Status   Specimen Description URINE, CATHETERIZED   Final   Special Requests NONE   Final   Culture  Setup Time     Final   Value: 10/14/2013 21:53     Performed at [place]     Final   Value: NO GROWTH     Performed at [facility]   Culture     Final   Value: NO GROWTH     Performed at [facility]   Report Status 10/15/2013 FINAL   Final  MRSA PCR SCREENING     Status: None   Collection Time    10/15/13  2:15 AM      Result Value Range Status   MRSA by PCR NEGATIVE  NEGATIVE Final   Comment:            The GeneXpert MRSA Assay (FDA     approved for NASAL specimens     only), is one component of a     comprehensive MRSA colonization     surveillance program. It is not     intended to diagnose MRSA     infection nor to guide or     monitor treatment for     MRSA infections.  RAPID STREP SCREEN  Status: None   Collection Time    10/15/13  6:26 AM      Result Value Range Status   Streptococcus, Group A Screen (Direct) NEGATIVE  NEGATIVE Final   Comment: (NOTE)     A Rapid Antigen test may result negative if the antigen level in the     sample is below the detection level of this test. The FDA has  not     cleared this test as a stand-alone test therefore the rapid antigen     negative result has reflexed to a Group A Strep culture.  CULTURE, GROUP A STREP     Status: None   Collection Time    10/15/13  6:26 AM      Result Value Range Status   Specimen Description THROAT   Final   Special Requests NONE   Final   Culture     Final   Value: No Beta Hemolytic Streptococci Isolated     Performed at [facility]   Report Status 10/18/2013 FINAL   Final  CULTURE, RESPIRATORY (NON-EXPECTORATED)     Status: None   Collection Time    10/15/13  3:47 PM      Result Value Range Status   Specimen Description BRONCHIAL ALVEOLAR LAVAGE   Final   Special Requests NONE   Final   Gram Stain     Final   Value: ABUNDANT WBC PRESENT, PREDOMINANTLY PMN     NO SQUAMOUS EPITHELIAL CELLS SEEN     NO ORGANISMS SEEN     Performed at [facility]   Culture     Final   Value: NO GROWTH 2 DAYS     Performed at [facility]   Report Status 10/17/2013 FINAL   Final  AFB CULTURE WITH SMEAR     Status: None   Collection Time    10/15/13  3:47 PM      Result Value Range Status   Specimen Description BRONCHIAL ALVEOLAR LAVAGE   Final   Special Requests NONE   Final   ACID FAST SMEAR     Final   Value: NO ACID FAST BACILLI SEEN     Performed at [facility]   Culture     Final   Value: CULTURE WILL BE EXAMINED FOR 6 WEEKS BEFORE ISSUING A FINAL REPORT     Performed at [facility]   Report Status PENDING   Incomplete  PNEUMOCYSTIS JIROVECI SMEAR BY DFA     Status: None   Collection Time    10/15/13  3:47 PM      Result Value Range Status   Specimen Source-PJSRC BRONCHIAL ALVEOLAR LAVAGE   Final   Pneumocystis jiroveci Ag NEGATIVE   Final  FUNGUS CULTURE W SMEAR     Status: None   Collection Time    10/15/13  3:47 PM      Result Value Range Status   Specimen Description BRONCHIAL ALVEOLAR LAVAGE   Final   Special Requests NONE   Final   Fungal Smear      Final   Value: NO YEAST OR FUNGAL ELEMENTS SEEN     Performed at [facility]   Culture     Final   Value: CULTURE IN PROGRESS FOR FOUR WEEKS     Performed at [facility]   Report Status PENDING   Incomplete  RESPIRATORY VIRUS PANEL     Status: None   Collection Time  10/15/13  3:47 PM      Result Value Range Status   Source - RVPAN BRONCHIAL ALVEOLAR LAVAGE   Corrected   Comment: CORRECTED ON 01/30 AT 2221: PREVIOUSLY REPORTED AS BRONCHIAL ALVEOLAR LAVAGE   Respiratory Syncytial Virus A NOT DETECTED   Final   Respiratory Syncytial Virus B NOT DETECTED   Final   Influenza A NOT DETECTED   Final   Influenza B NOT DETECTED   Final   Parainfluenza 1 NOT DETECTED   Final   Parainfluenza 2 NOT DETECTED   Final   Parainfluenza 3 NOT DETECTED   Final   Metapneumovirus NOT DETECTED   Final   Rhinovirus NOT DETECTED   Final   Adenovirus NOT DETECTED   Final   Influenza A H1 NOT DETECTED   Final   Influenza A H3 NOT DETECTED   Final   Comment: (NOTE)           Normal Reference Range for each Analyte: NOT DETECTED     Testing performed using the Luminex xTAG Respiratory Viral Panel test     kit.     This test was developed and its performance characteristics determined     by [facility]. It has not been cleared or approved by the [place]     Food and Drug Administration. This test is used for clinical purposes.     It should not be regarded as investigational or for research. This     laboratory is certified under the [place] (CLIA) as qualified to perform high complexity     clinical laboratory testing.     Performed at [place] DIFFICILE BY PCR     Status: None   Collection Time    10/22/13 12:03 PM      Result Value Range Status   C difficile by pcr NEGATIVE  NEGATIVE Final    Blood Culture:    Component Value Date/Time   SDES BRONCHIAL ALVEOLAR LAVAGE 10/15/2013 1630   SPECREQUEST  NONE 10/15/2013 1630   CULT  Value: NO LEGIONELLA ISOLATED Performed at [facility] 10/15/2013 1630   REPTSTATUS 10/21/2013 FINAL 10/15/2013 1630    Studies/Results: No results found.  Medications:  Scheduled Meds: . folic acid  1 mg Per Tube Daily  . insulin aspart  0-9 Units Subcutaneous Q4H  . levETIRAcetam  500 mg Oral BID  . methylPREDNISolone (SOLU-MEDROL) injection  20 mg Intravenous Q12H  . pantoprazole  40 mg Oral QHS  . pregabalin  300 mg Oral BID  . sodium chloride  1,000 mL Intravenous Once   Continuous Infusions: . dextrose 50 mL/hr at 10/22/13 2039   PRN Meds:.docusate sodium, fentaNYL, polyethylene glycol, sodium chloride  Antibiotics: Anti-infectives   Start     Dose/Rate Route Frequency Ordered Stop   10/17/13 1100  cefTRIAXone (ROCEPHIN) 1 g in dextrose 5 % 50 mL IVPB     1 g 100 mL/hr over 30 Minutes Intravenous Every 24 hours 10/17/13 1020 10/21/13 1130   10/15/13 1300  oseltamivir (TAMIFLU) 6 MG/ML suspension 75 mg  Status:  Discontinued     75 mg Oral 2 times daily 10/15/13 1252 10/18/13 0932   10/15/13 1245  oseltamivir (TAMIFLU) capsule 75 mg  Status:  Discontinued     75 mg Oral 2 times daily 10/15/13 1227 10/15/13 1252   10/15/13 0730  vancomycin (VANCOCIN) IVPB 750 mg/150 ml premix  Status:  Discontinued  750 mg 150 mL/hr over 60 Minutes Intravenous Every 12 hours 10/14/13 2031 10/17/13 1020   10/15/13 0000  azithromycin (ZITHROMAX) 500 mg in dextrose 5 % 250 mL IVPB  Status:  Discontinued     500 mg 250 mL/hr over 60 Minutes Intravenous Every 24 hours 10/14/13 2351 10/16/13 0847   10/14/13 1930  piperacillin-tazobactam (ZOSYN) IVPB 3.375 g  Status:  Discontinued     3.375 g 12.5 mL/hr over 240 Minutes Intravenous 3 times per day 10/14/13 1855 10/17/13 1020   10/14/13 1900  vancomycin (VANCOCIN) IVPB 1000 mg/200 mL premix     1,000 mg 200 mL/hr over 60 Minutes Intravenous  Once 10/14/13 1855 10/14/13 2030   10/14/13 1845  oseltamivir  (TAMIFLU) capsule 75 mg     75 mg Oral  Once 10/14/13 1833 10/14/13 1851     Antibiotics Given (last 72 hours)   Date/Time Action Medication Dose Rate   10/21/13 1100 Given   cefTRIAXone (ROCEPHIN) 1 g in dextrose 5 % 50 mL IVPB 1 g 100 mL/hr      Day of Hospitalization:  9 Consults: Treatment Team:  Durward Parcel, MD Catarina Hartshorn, MD  Assessment/Plan:    #Acute Delirium - Resolving.  Pt recent transfer from ICU to IMTS.   Pt appears much better today and is more responsive.  We discontinued all of her psychotropic medications and put her back on her regular home meds.  Since she was unable to swallow yesterday she did not get any meds orally which seemed to improve her mental status.  SLP saw her this AM and recommended dysphagia 1 diet.  Most likely etiology of acute delirium was combination of medications including precedex.   -continue on home meds -diet dysphagia 1 -monitor -CMP, Mg, Phos in AM  #Severe sepsis secondary to CAP- Resolved.  Pt currently afebrile with HR 71, RR 33, and BP 115/65.  She is saturating 100% RA.  Pt with leukocytosis but is on solu-medrol.   -cbc in AM -procalcitonin -consider repeat CXR   #?Autoimmune Disease - p-ANCA positive; myeloperoxidase 1.2 (H); ESR 98 (but would expect this to be high in acute illness and is non-specific); ANA positive; c3/c4 negative; RF 13 (wnl); anti-DNA 1 (negative); CRP 23.8 (H); cryoglobulin negative; TSH/T4 low; ANA titer 1:80; p-ANCA titer 1:20 (nl=<1:20).  -continue solumedrol 32m q12h -further workup as indicated; however, pt is showing improvement in mental status and may be able to worked up further as an outpatient  #Chronic Pain - Unable to assess. -continue lyrica [id number] -continue fentanyl patch q2h PRN  #Hypokalemia - Potassium 3.6 today, but has been running lower.   -Mg, Phos -replete as needed  #Seizure disorder - No seizures while admitted.  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  Ferritin was high but this was check in acute illness and therefore may be falsely elevated.  Retic count wnl.  -continue folate supplementation -check MMA  #Dispo- Disposition is deferred at this time, awaiting improvement of current medical problems.  Anticipated discharge in approximately 1-2 day(s).    LOS: 9 days   JacMichail JewelsD PGY-1, Internal Medicine  [phone]-5PM Mon-Fri) 10/23/2013, 4:04 PM

## 2013-10-23 NOTE — Progress Notes (Signed)
Subjective: No complaints other than pain on swallowing or attempting to talk.  Objective: Current vital signs: BP 157/92  Pulse 72  Temp(Src) 98.3 F (36.8 C) (Oral)  Resp 38  Ht 5\' 1"  (1.549 m)  Wt 54 kg (119 lb 0.8 oz)  BMI 22.51 kg/m2  SpO2 100%  Neurologic Exam: Alert and in no acute distress. Patient was more conversant today. Speech was mostly whispered but understandable for the most part. She moved extremities equally. Muscle tone appeared to be somewhat increased and both upper extremities. Tremor of the right leg and foot only rarely occurred.  Medications: I have reviewed the patient's current medications.  Assessment/Plan: Improvement in speech with patient being more verbally communicative compared to evaluation him yesterday. Tremor of the right lower extremity is also much less frequent. Patient's encephalopathic state appears to be improving.  I am recommending no changes in current management. I will continue to follow this patient with you.  C.R. Roseanne RenoStewart, MD [facility]  10/23/2013  8:40 AM

## 2013-10-23 NOTE — Progress Notes (Addendum)
Pt is resting and states pain medication helped with pain. Pt's pain level is down to a 2/10 on pain scale. Pt is smiling and joking with family visiting.  Removed pt's foley catheter per md orders.

## 2013-10-24 ENCOUNTER — Inpatient Hospital Stay (HOSPITAL_COMMUNITY): Payer: Medicaid Other

## 2013-10-24 LAB — CBC WITH DIFFERENTIAL/PLATELET
BASOS ABS: 0 10*3/uL (ref 0.0–0.1)
Basophils Relative: 0 % (ref 0–1)
EOS PCT: 0 % (ref 0–5)
Eosinophils Absolute: 0 10*3/uL (ref 0.0–0.7)
HCT: 35.1 % — ABNORMAL LOW (ref 36.0–46.0)
Hemoglobin: 11.2 g/dL — ABNORMAL LOW (ref 12.0–15.0)
Lymphocytes Relative: 13 % (ref 12–46)
Lymphs Abs: 2 10*3/uL (ref 0.7–4.0)
MCH: 28 pg (ref 26.0–34.0)
MCHC: 31.9 g/dL (ref 30.0–36.0)
MCV: 87.8 fL (ref 78.0–100.0)
Monocytes Absolute: 1.5 10*3/uL — ABNORMAL HIGH (ref 0.1–1.0)
Monocytes Relative: 10 % (ref 3–12)
Neutro Abs: 12.2 10*3/uL — ABNORMAL HIGH (ref 1.7–7.7)
Neutrophils Relative %: 78 % — ABNORMAL HIGH (ref 43–77)
PLATELETS: 410 10*3/uL — AB (ref 150–400)
RBC: 4 MIL/uL (ref 3.87–5.11)
RDW: 17.2 % — ABNORMAL HIGH (ref 11.5–15.5)
WBC: 15.7 10*3/uL — ABNORMAL HIGH (ref 4.0–10.5)

## 2013-10-24 LAB — COMPREHENSIVE METABOLIC PANEL
ALT: 61 U/L — ABNORMAL HIGH (ref 0–35)
AST: 26 U/L (ref 0–37)
Albumin: 2.6 g/dL — ABNORMAL LOW (ref 3.5–5.2)
Alkaline Phosphatase: 87 U/L (ref 39–117)
BUN: 9 mg/dL (ref 6–23)
CALCIUM: 8.2 mg/dL — AB (ref 8.4–10.5)
CHLORIDE: 104 meq/L (ref 96–112)
CO2: 23 meq/L (ref 19–32)
CREATININE: 0.66 mg/dL (ref 0.50–1.10)
GLUCOSE: 108 mg/dL — AB (ref 70–99)
Potassium: 2.9 mEq/L — CL (ref 3.7–5.3)
Sodium: 143 mEq/L (ref 137–147)
Total Bilirubin: 0.5 mg/dL (ref 0.3–1.2)
Total Protein: 6.3 g/dL (ref 6.0–8.3)

## 2013-10-24 LAB — GLUCOSE, CAPILLARY
GLUCOSE-CAPILLARY: 99 mg/dL (ref 70–99)
Glucose-Capillary: 126 mg/dL — ABNORMAL HIGH (ref 70–99)
Glucose-Capillary: 102 mg/dL — ABNORMAL HIGH (ref 70–99)
Glucose-Capillary: 89 mg/dL (ref 70–99)
Glucose-Capillary: 111 mg/dL — ABNORMAL HIGH (ref 70–99)
Glucose-Capillary: 109 mg/dL — ABNORMAL HIGH (ref 70–99)

## 2013-10-24 LAB — PROCALCITONIN: Procalcitonin: <0.1 ng/mL

## 2013-10-24 LAB — PHOSPHORUS: Phosphorus: 2.7 mg/dL (ref 2.3–4.6)

## 2013-10-24 LAB — HSV 2 ANTIBODY, IGG: HSV 2 GLYCOPROTEIN G AB, IGG: 12.01 IV — AB

## 2013-10-24 LAB — MAGNESIUM: Magnesium: 2.3 mg/dL (ref 1.5–2.5)

## 2013-10-24 LAB — ANGIOTENSIN CONVERTING ENZYME: Angiotensin-Converting Enzyme: 17 U/L (ref 8–52)

## 2013-10-24 LAB — HSV 1 ANTIBODY, IGG: HSV 1 GLYCOPROTEIN G AB, IGG: 1.93 IV — AB

## 2013-10-24 MED ORDER — VALACYCLOVIR HCL 500 MG PO TABS
1000.0000 mg | ORAL_TABLET | Freq: Two times a day (BID) | ORAL | Status: DC
  Administered 2013-10-24 – 2013-10-28 (×9): 1000 mg via ORAL
  Filled 2013-10-24 (×10): qty 2

## 2013-10-24 MED ORDER — PREDNISONE 50 MG PO TABS
60.0000 mg | ORAL_TABLET | Freq: Every day | ORAL | Status: DC
  Administered 2013-10-25 – 2013-10-28 (×4): 60 mg via ORAL
  Filled 2013-10-24 (×6): qty 1

## 2013-10-24 MED ORDER — ONDANSETRON HCL 4 MG/2ML IJ SOLN
4.0000 mg | Freq: Three times a day (TID) | INTRAMUSCULAR | Status: DC | PRN
  Administered 2013-10-24: 4 mg via INTRAVENOUS
  Filled 2013-10-24: qty 2

## 2013-10-24 MED ORDER — GI COCKTAIL ~~LOC~~
30.0000 mL | Freq: Once | ORAL | Status: AC
  Administered 2013-10-24: 30 mL via ORAL
  Filled 2013-10-24: qty 30

## 2013-10-24 MED ORDER — POTASSIUM CHLORIDE 10 MEQ/100ML IV SOLN
10.0000 meq | INTRAVENOUS | Status: AC
  Administered 2013-10-24 (×6): 10 meq via INTRAVENOUS
  Filled 2013-10-24 (×3): qty 100

## 2013-10-24 MED ORDER — IOHEXOL 300 MG/ML  SOLN
25.0000 mL | INTRAMUSCULAR | Status: AC
  Administered 2013-10-24 (×2): 25 mL via ORAL

## 2013-10-24 MED ORDER — GADOBENATE DIMEGLUMINE 529 MG/ML IV SOLN
10.0000 mL | Freq: Once | INTRAVENOUS | Status: AC | PRN
  Administered 2013-10-24: 10 mL via INTRAVENOUS

## 2013-10-24 NOTE — Progress Notes (Signed)
  Date: 10/24/2013  Patient name: [name] [name]  Medical record number: [id number]  Date of birth: 06/06/1961   This patient has been seen and the plan of care was discussed with the house staff. Please see their note for complete details. I concur with their findings with the following additions/corrections:  See above excellent MS 3 and R1 notes  Patient has DRAMATICALLY improved this am, even since exam by MS3, R1 and R2.   I am still puzzled as to what caused her apparent "septic" picture as HCAP, CAP are not in cards here with clean Bronchoscopy  Will get CT abdomen and pelvis to ensure no occult ID source of infection present there. I think the 1/2 blood cultures is likley going to be Coag Neg Staph vs Viridans Strep and contaminant. If it is MSSA or MRSA will be of course treated as and worked up as genuine bacteremia  We will get MRI of the brain today.  I think there is a very good chance that the patient might have Sarcoidosis given her hilar LA, nonspecific elevated inflammatory markers , auto-immune antibodies and hx of what sounds like E nodosum. I suspect some of her delirium is steroid induced +/- typical ICU delirium , though Sarcoid, or CTD causing CNS infection are in differential as well. I doubt HSV1 meningoencephalitis, or infectious meningoencephalitis period since it would not unify pathology in her lungs including her hilar lymphadenopathy  I tried to get in touch with some of my friends and colleagues in Rheumatology today but was unsuccessful.  There is unfortunately [facility].   [name], MD 10/24/2013, 4:13 PM

## 2013-10-24 NOTE — Progress Notes (Signed)
Medical Student Daily Progress Note  Subjective: Ms. Abdo is conversant this morning and able to have a normal conversation. She has no recollection of the events that brought her into the hospital, and is only aware that she had 2 episodes of pneumonia. Overnight, she reports vomiting and was not able to sleep. She also endorses LLQ abdominal pain that began before she was in the hospital. She complains of difficulty focusing her vision and decreased movement in her R hand, both which began several months ago and have progressively worsened. She reports pain red bumps on her lower legs approximately 6 months ago that have since resolved. Upon further questioning, she endorses a positive family history of Lupus in her grandmother.   Objective: Vital signs in last 24 hours: Filed Vitals:   10/24/13 0400 10/24/13 0413 10/24/13 0500 10/24/13 0700  BP: 114/68 114/68 118/71 114/68  Pulse: 87 80 76 65  Temp:  98 F (36.7 C)  98.5 F (36.9 C)  TempSrc:  Oral  Oral  Resp: '15 25 29 21  ' Height:      Weight:  53.7 kg (118 lb 6.2 oz)    SpO2: 97% 98% 97% 97%   Weight change: -0.3 kg (-10.6 oz)  Intake/Output Summary (Last 24 hours) at 10/24/13 1206 Last data filed at 10/24/13 0400  Gross per 24 hour  Intake   1000 ml  Output    400 ml  Net    600 ml   Physical Exam: Constitutional: Alert, in no acute distress and cooperative with exam.  Eyes: PERRLA, conjunctivae normal, no scleral icterus  Neck: Supple, Trachea midline  Cardiovascular: RRR, S1, S2 present, no MRG, DP 2+ b/l,  Pulmonary/Chest: normal respiratory effort, CTAB, no wheezes, rales, or rhonchi  Abdominal: Soft. +BS, LLQ tenderness; non-distended  Neurological: Alert and responsive and able to follow commands; cranial nerves II-XII are grossly intact, moving all extremities  GU: bilateral labial lesions with open sores  Skin: Warm, dry and intact.  Psychiatric: Normal mood and affect.   Micro Results: Recent Results (from  the past 240 hour(s))  CULTURE, BLOOD (ROUTINE X 2)     Status: None   Collection Time    10/14/13  6:40 PM      Result Value Range Status   Specimen Description BLOOD LEFT ARM   Final   Special Requests BOTTLES DRAWN AEROBIC ONLY 4CC   Final   Culture  Setup Time     Final   Value: 10/15/2013 01:57     Performed at [facility]   Culture     Final   Value: NO GROWTH 5 DAYS     Performed at [facility]   Report Status 10/21/2013 FINAL   Final  CULTURE, BLOOD (ROUTINE X 2)     Status: None   Collection Time    10/14/13  6:45 PM      Result Value Range Status   Specimen Description BLOOD LEFT HAND   Final   Special Requests BOTTLES DRAWN AEROBIC ONLY 5CC   Final   Culture  Setup Time     Final   Value: 10/15/2013 01:57     Performed at [facility]   Culture     Final   Value: NO GROWTH 5 DAYS     Performed at [facility]   Report Status 10/21/2013 FINAL   Final  URINE CULTURE     Status: None   Collection Time    10/14/13  8:56 PM      Result Value Range Status   Specimen Description URINE, CATHETERIZED   Final   Special Requests NONE   Final   Culture  Setup Time     Final   Value: 10/14/2013 21:53     Performed at [facility]     Final   Value: NO GROWTH     Performed at [facility]   Culture     Final   Value: NO GROWTH     Performed at [facility]   Report Status 10/15/2013 FINAL   Final  MRSA PCR SCREENING     Status: None   Collection Time    10/15/13  2:15 AM      Result Value Range Status   MRSA by PCR NEGATIVE  NEGATIVE Final   Comment:            The GeneXpert MRSA Assay (FDA     approved for NASAL specimens     only), is one component of a     comprehensive MRSA colonization     surveillance program. It is not     intended to diagnose MRSA     infection nor to guide or     monitor treatment for     MRSA infections.  RAPID STREP SCREEN     Status: None   Collection Time     10/15/13  6:26 AM      Result Value Range Status   Streptococcus, Group A Screen (Direct) NEGATIVE  NEGATIVE Final   Comment: (NOTE)     A Rapid Antigen test may result negative if the antigen level in the     sample is below the detection level of this test. The FDA has not     cleared this test as a stand-alone test therefore the rapid antigen     negative result has reflexed to a Group A Strep culture.  CULTURE, GROUP A STREP     Status: None   Collection Time    10/15/13  6:26 AM      Result Value Range Status   Specimen Description THROAT   Final   Special Requests NONE   Final   Culture     Final   Value: No Beta Hemolytic Streptococci Isolated     Performed at [facility]   Report Status 10/18/2013 FINAL   Final  CULTURE, RESPIRATORY (NON-EXPECTORATED)     Status: None   Collection Time    10/15/13  3:47 PM      Result Value Range Status   Specimen Description BRONCHIAL ALVEOLAR LAVAGE   Final   Special Requests NONE   Final   Gram Stain     Final   Value: ABUNDANT WBC PRESENT, PREDOMINANTLY PMN     NO SQUAMOUS EPITHELIAL CELLS SEEN     NO ORGANISMS SEEN     Performed at [facility]   Culture     Final   Value: NO GROWTH 2 DAYS     Performed at [facility]   Report Status 10/17/2013 FINAL   Final  AFB CULTURE WITH SMEAR     Status: None   Collection Time    10/15/13  3:47 PM      Result Value Range Status   Specimen Description BRONCHIAL ALVEOLAR LAVAGE   Final   Special Requests NONE   Final   ACID FAST SMEAR  Final   Value: NO ACID FAST BACILLI SEEN     Performed at [facility]   Culture     Final   Value: CULTURE WILL BE EXAMINED FOR 6 WEEKS BEFORE ISSUING A FINAL REPORT     Performed at [facility]   Report Status PENDING   Incomplete  PNEUMOCYSTIS JIROVECI SMEAR BY DFA     Status: None   Collection Time    10/15/13  3:47 PM      Result Value Range Status   Specimen Source-[facility] BRONCHIAL ALVEOLAR LAVAGE    Final   Pneumocystis jiroveci Ag NEGATIVE   Final  FUNGUS CULTURE W SMEAR     Status: None   Collection Time    10/15/13  3:47 PM      Result Value Range Status   Specimen Description BRONCHIAL ALVEOLAR LAVAGE   Final   Special Requests NONE   Final   Fungal Smear     Final   Value: NO YEAST OR FUNGAL ELEMENTS SEEN     Performed at [facility]   Culture     Final   Value: CULTURE IN PROGRESS FOR FOUR WEEKS     Performed at [facility]   Report Status PENDING   Incomplete  RESPIRATORY VIRUS PANEL     Status: None   Collection Time    10/15/13  3:47 PM      Result Value Range Status   Source - [facility] BRONCHIAL ALVEOLAR LAVAGE   Corrected   Comment: CORRECTED ON 01/30 AT 2221: PREVIOUSLY REPORTED AS BRONCHIAL ALVEOLAR LAVAGE   Respiratory Syncytial Virus A NOT DETECTED   Final   Respiratory Syncytial Virus B NOT DETECTED   Final   Influenza A NOT DETECTED   Final   Influenza B NOT DETECTED   Final   Parainfluenza 1 NOT DETECTED   Final   Parainfluenza 2 NOT DETECTED   Final   Parainfluenza 3 NOT DETECTED   Final   Metapneumovirus NOT DETECTED   Final   Rhinovirus NOT DETECTED   Final   Adenovirus NOT DETECTED   Final   Influenza A H1 NOT DETECTED   Final   Influenza A H3 NOT DETECTED   Final   Comment: (NOTE)           Normal Reference Range for each Analyte: NOT DETECTED     Testing performed using the Luminex xTAG Respiratory Viral Panel test     kit.     This test was developed and its performance characteristics determined     by [facility]. It has not been cleared or approved by the [place]     Food and Drug Administration. This test is used for clinical purposes.     It should not be regarded as investigational or for research. This     laboratory is certified under the [place] (CLIA) as qualified to perform high complexity     clinical laboratory testing.     Performed at [facility] DIFFICILE BY PCR     Status: None   Collection Time    10/22/13 12:03 PM      Result Value Range Status   C difficile by pcr NEGATIVE  NEGATIVE Final  URINE CULTURE     Status: None   Collection Time    10/22/13  9:15 PM      Result Value Range Status  Specimen Description URINE, CATHETERIZED   Final   Special Requests NONE   Final   Culture  Setup Time     Final   Value: 10/22/2013 22:16     Performed at [facility]     Final   Value: NO GROWTH     Performed at [facility]   Culture     Final   Value: NO GROWTH     Performed at [facility]   Report Status 10/23/2013 FINAL   Final  CULTURE, BLOOD (ROUTINE X 2)     Status: None   Collection Time    10/22/13  9:51 PM      Result Value Range Status   Specimen Description BLOOD LEFT ARM   Final   Special Requests BOTTLES DRAWN AEROBIC AND ANAEROBIC 10CC   Final   Culture  Setup Time     Final   Value: 10/23/2013 00:26     Performed at [facility]   Culture     Final   Value: GRAM POSITIVE COCCI IN CLUSTERS     Note: Gram Stain Report Called to,Read Back By and Verified With: TINA SAVAGE ON 10/23/2013 AT 10:31P BY [employer]     Performed at [facility]   Report Status PENDING   Incomplete  CULTURE, BLOOD (ROUTINE X 2)     Status: None   Collection Time    10/22/13 11:10 PM      Result Value Range Status   Specimen Description BLOOD LEFT FINGER   Final   Special Requests BOTTLES DRAWN AEROBIC ONLY 2CC FROM LEFT THUMB   Final   Culture  Setup Time     Final   Value: 10/23/2013 08:45     Performed at [facility]   Culture     Final   Value:        BLOOD CULTURE RECEIVED NO GROWTH TO DATE CULTURE WILL BE HELD FOR 5 DAYS BEFORE ISSUING A FINAL NEGATIVE REPORT     Performed at [facility]   Report Status PENDING   Incomplete  WET PREP, GENITAL     Status: Abnormal   Collection Time    10/23/13  7:16 PM      Result Value Range Status   Yeast  Wet Prep HPF POC NONE SEEN  NONE SEEN Final   Trich, Wet Prep NONE SEEN  NONE SEEN Final   Clue Cells Wet Prep HPF POC NONE SEEN  NONE SEEN Final   WBC, Wet Prep HPF POC MODERATE (*) NONE SEEN Final   Studies/Results: No results found. Medications: I have reviewed the patient's current medications. Scheduled Meds: . acyclovir  400 mg Oral TID  . folic acid  1 mg Per Tube Daily  . insulin aspart  0-9 Units Subcutaneous Q4H  . iohexol  25 mL Oral Q1 Hr x 2  . levETIRAcetam  500 mg Oral BID  . methylPREDNISolone (SOLU-MEDROL) injection  20 mg Intravenous Q12H  . pantoprazole  40 mg Oral QHS  . pregabalin  300 mg Oral BID   Continuous Infusions: . dextrose 50 mL/hr at 10/23/13 2200   PRN Meds:.docusate sodium, fentaNYL, polyethylene glycol, sodium chloride Assessment/Plan: Principal Problem:   Severe sepsis(995.92) Active Problems:   CAP (community acquired pneumonia)   Sepsis   Chronic pain   Hypoxia   Leukocytosis   Seizure disorder   Fibromyalgia   Anemia of chronic disease   Hematuria  History of renal calculi, multiple   Abdominal pain, epigastric   Nausea   Pharyngitis   Acute respiratory failure with hypoxia   Altered mental status   LOS: 10 days   #Acute Delirium - Resolved. Pt appears much better today and is alert and oriented x3. We discontinued all of her psychotropic medications and put her back on her regular home meds. Most likely etiology of acute delirium was combination of medications including precedex. Differential also includes autoimmune, encephalitis, and post-ictal.  -MRI Brain  -continue on home meds  -diet dysphagia 1  -monitor   #Severe sepsis- Resolved. Pt currently afebrile with HR 65, RR 21, and BP 114/68. She is saturating 97% RA. Pt with leukocytosis but is on solumedrol. CAP suspected, but no purulence on bronchoscopy. +blood culture from 2/5 with gram + cocci  -CT abdomen and pelvis to evaluate for possible abdominal source -consider  repeat CXR if clinical exam or respiratory status worsens  -f/u blood cultures -consider antibiotics if patient spikes fever or becomes hemodynamically unstable   #?Autoimmune Disease - p-ANCA positive; myeloperoxidase 1.2 (H); ESR 98 (but would expect this to be high in acute illness and is non-specific); ANA positive; c3/c4 negative; RF 13 (wnl); anti-DNA 1 (negative); CRP 23.8 (H); cryoglobulin negative; TSH/T4 low; ANA titer 1:80; p-ANCA titer 1:20 (nl=<1:20). CXR with evidence of hilar adenopathy, concerning for sarcoid. ?history of erythema M=nodsum. Family history of lupus in maternal grandmother.  -continue solumedrol 15m q12h  -ACE level  -rheumatology consulted, appreciated recs   #Chronic Pain - Unable to assess.  -continue lyrica [id number]  -continue fentanyl patch q2h PRN   #Hypokalemia - Potassium 2.9 today, Mg 2.2.  -replete as needed   #Seizure disorder - No seizures while admitted.  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 Ferritin was high but this was check in acute illness and therefore may be falsely elevated. Retic count wnl.  -continue folate supplementation  -check MMA   #Dispo- Disposition is deferred at this time, awaiting improvement of current medical problems. Anticipated discharge in approximately 1-2 day(s).    This is a [employer] information officerote.  The care of the patient was discussed with Dr. GiGordy Levannd the assessment and plan formulated with their assistance.  Please see their attached note for official documentation of the daily encounter.  WoJake Bathe 10/24/2013, 12:06 PM

## 2013-10-24 NOTE — Progress Notes (Signed)
Pt was suppose to go have an MRI today. MRI called and said they were coming to get pt around 1750. Nurse flushed pt's iv and pt complained about iv burning some and it looked red. Nurse called iv team to come and place an iv. Removed iv to rt arm , tip intact and pt tolerated well. Left iv in left forearm for iv nurse specialist to see so he did not try to place iv in that area. Called MRI to notify. IV nurse used ultra sound to place an iv. Called and notified MRI. Transporter came and took pt to MRI just now. Called and notified central telemetry.

## 2013-10-24 NOTE — Progress Notes (Signed)
Brief Interval Progress Note  I was contacted with a critical value of a positive blood culture.  1 out of 2 blood cultures drawn 10/22/13 shows gram positive cocci in clusters.  The 2nd blood culture is still pending.  Per chart review, blood cultures on admission (1/27) have shown no growth x2.  Labs show only mild leukocytosis on 2/4 of 14.6, down from 46 earlier in her admission.  The patient is presently afibrile since around 5:00 AM yesterday morning, and vitals show stable BP at 126/82, with no tachycardia.  We will repeat blood cultures at this time.  If pt develops recurrent fever or decline in hemodynamic status, I have a low threshold for starting antibiotics given her complicated hospital course.  SignedJanalyn Harder, Parley Pidcock, PGY3 10/24/2013, 1:00 AM

## 2013-10-24 NOTE — Progress Notes (Signed)
Subjective:   Pt has greatly improved since yesterday and is able to carry on a normal conversation with [place].  Overnight she was unable to sleep and had severe dyspepsia and vomited. She has c/o decreased vision over recent past that has progressively gotten worse and has difficulty writing with her right hand.  She reports there is a family history of lupus.  She states she had multiple diagnostic testing done in [place] and even had a spinal tap.  She also endorses some painful red lesions on her LE several months ago that resolved.  She was finally diagnosed with fibromyalgia.    Objective:   Vital signs in last 24 hours: Filed Vitals:   10/24/13 0413 10/24/13 0500 10/24/13 0700 10/24/13 1200  BP: 114/68 118/71 114/68 112/64  Pulse: 80 76 65 72  Temp: 98 F (36.7 C)  98.5 F (36.9 C) 97.3 F (36.3 C)  TempSrc: Oral  Oral Oral  Resp: _0 Height:      Weight: 118 lb 6.2 oz (53.7 kg)     SpO2: 98% 97% 97% 97%   Weight change: -10.6 oz (-0.3 kg)  Intake/Output Summary (Last 24 hours) at 10/24/13 1333 Last data filed at 10/24/13 0400  Gross per 24 hour  Intake    950 ml  Output    400 ml  Net    550 ml    Physical Exam: Constitutional: Vital signs reviewed.  Patient is in no acute distress and cooperative with exam.   Head: Normocephalic and atraumatic Eyes: Ptosis b/l; EOMI, conjunctivae normal, no scleral icterus  Neck: Supple, Trachea midline Cardiovascular: RRR, S1, S2 present, no MRG, DP 2+ b/l,  Pulmonary/Chest: normal respiratory effort, CTAB, no wheezes, rales, or rhonchi Abdominal: Soft. +BS, Diffuse tenderness; non-distended Neurological: A&O x3; CNII-XII are grossly intact, moving all extremities  GU: bilateral labial lesions with open sores  Skin: Warm, dry and intact.  Psychiatric: Normal mood and affect.   Lab Results:  BMP:  Recent Labs Lab 10/21/13 0222 10/22/13 0234 10/24/13 0240  NA 145 141 143  K 3.5* 3.6* 2.9*  CL 100 103  104  CO2 _1 GLUCOSE 100* 120* 108*  BUN _2 CREATININE 0.66 0.71 0.66  CALCIUM 8.7 8.0* 8.2*  MG 2.6*  --  2.3  PHOS 3.2  --  2.7    CBC:  Recent Labs Lab 10/22/13 1310 10/24/13 0240  WBC 14.6* 15.7*  NEUTROABS  --  12.2*  HGB 12.3 11.2*  HCT 37.9 35.1*  MCV 86.5 87.8  PLT 362 410*    Coagulation: No results found for this basename: LABPROT, INR,  in the last 168 hours  CBG:            Recent Labs Lab 10/23/13 1707 10/23/13 2024 10/23/13 2359 10/24/13 0412 10/24/13 0737 10/24/13 1215  GLUCAP 105* 128* 126* 102* 89 111*           HA1C:      No results found for this basename: HGBA1C,  in the last 168 hours  Lipid Panel: No results found for this basename: CHOL, HDL, LDLCALC, TRIG, CHOLHDL, LDLDIRECT,  in the last 168 hours  LFTs:  Recent Labs Lab 10/22/13 0234 10/24/13 0240  AST 37 26  ALT 86* 61*  ALKPHOS 91 87  BILITOT 0.4 0.5  PROT 5.9* 6.3  ALBUMIN 2.2* 2.6*    Pancreatic Enzymes: No results found for this basename: LIPASE, AMYLASE,  in the last 168 hours  Ammonia: No results found for this basename: AMMONIA,  in the last 168 hours  Cardiac Enzymes:  Recent Labs Lab 10/17/13 1517  TROPONINI <0.30   Lab Results  Component Value Date   TROPONINI <0.30 10/17/2013    EKG:  Date/Time:    Ventricular Rate:    PR Interval:    QRS Duration:   QT Interval:    QTC Calculation:   R Axis:     Text Interpretation:    BNP: No results found for this basename: PROBNP,  in the last 168 hours  D-Dimer: No results found for this basename: DDIMER,  in the last 168 hours  Urinalysis: No results found for this basename: COLORURINE, [employer], LABSPEC, [place], GLUCOSEU, HGBUR, BILIRUBINUR, KETONESUR, [place], UROBILINOGEN, NITRITE, LEUKOCYTESUR,  in the last 168 hours  Micro Results: Recent Results (from the past 240 hour(s))  CULTURE, BLOOD (ROUTINE X 2)     Status: None   Collection Time    10/14/13  6:40 PM       Result Value Range Status   Specimen Description BLOOD LEFT ARM   Final   Special Requests BOTTLES DRAWN AEROBIC ONLY 4CC   Final   Culture  Setup Time     Final   Value: 10/15/2013 01:57     Performed at [place]     Final   Value: NO GROWTH 5 DAYS     Performed at [facility]   Report Status 10/21/2013 FINAL   Final  CULTURE, BLOOD (ROUTINE X 2)     Status: None   Collection Time    10/14/13  6:45 PM      Result Value Range Status   Specimen Description BLOOD LEFT HAND   Final   Special Requests BOTTLES DRAWN AEROBIC ONLY 5CC   Final   Culture  Setup Time     Final   Value: 10/15/2013 01:57     Performed at [place]     Final   Value: NO GROWTH 5 DAYS     Performed at [facility]   Report Status 10/21/2013 FINAL   Final  URINE CULTURE     Status: None   Collection Time    10/14/13  8:56 PM      Result Value Range Status   Specimen Description URINE, CATHETERIZED   Final   Special Requests NONE   Final   Culture  Setup Time     Final   Value: 10/14/2013 21:53     Performed at [place]     Final   Value: NO GROWTH     Performed at [facility]   Culture     Final   Value: NO GROWTH     Performed at [facility]   Report Status 10/15/2013 FINAL   Final  MRSA PCR SCREENING     Status: None   Collection Time    10/15/13  2:15 AM      Result Value Range Status   MRSA by PCR NEGATIVE  NEGATIVE Final   Comment:            The GeneXpert MRSA Assay (FDA     approved for NASAL specimens     only), is one component of a     comprehensive MRSA colonization     surveillance program. It is not     intended  to diagnose MRSA     infection nor to guide or     monitor treatment for     MRSA infections.  RAPID STREP SCREEN     Status: None   Collection Time    10/15/13  6:26 AM      Result Value Range Status   Streptococcus, Group A Screen (Direct) NEGATIVE  NEGATIVE Final     Comment: (NOTE)     A Rapid Antigen test may result negative if the antigen level in the     sample is below the detection level of this test. The FDA has not     cleared this test as a stand-alone test therefore the rapid antigen     negative result has reflexed to a Group A Strep culture.  CULTURE, GROUP A STREP     Status: None   Collection Time    10/15/13  6:26 AM      Result Value Range Status   Specimen Description THROAT   Final   Special Requests NONE   Final   Culture     Final   Value: No Beta Hemolytic Streptococci Isolated     Performed at [facility]   Report Status 10/18/2013 FINAL   Final  CULTURE, RESPIRATORY (NON-EXPECTORATED)     Status: None   Collection Time    10/15/13  3:47 PM      Result Value Range Status   Specimen Description BRONCHIAL ALVEOLAR LAVAGE   Final   Special Requests NONE   Final   Gram Stain     Final   Value: ABUNDANT WBC PRESENT, PREDOMINANTLY PMN     NO SQUAMOUS EPITHELIAL CELLS SEEN     NO ORGANISMS SEEN     Performed at [facility]   Culture     Final   Value: NO GROWTH 2 DAYS     Performed at [facility]   Report Status 10/17/2013 FINAL   Final  AFB CULTURE WITH SMEAR     Status: None   Collection Time    10/15/13  3:47 PM      Result Value Range Status   Specimen Description BRONCHIAL ALVEOLAR LAVAGE   Final   Special Requests NONE   Final   ACID FAST SMEAR     Final   Value: NO ACID FAST BACILLI SEEN     Performed at [facility]   Culture     Final   Value: CULTURE WILL BE EXAMINED FOR 6 WEEKS BEFORE ISSUING A FINAL REPORT     Performed at [facility]   Report Status PENDING   Incomplete  PNEUMOCYSTIS JIROVECI SMEAR BY DFA     Status: None   Collection Time    10/15/13  3:47 PM      Result Value Range Status   Specimen Source-PJSRC BRONCHIAL ALVEOLAR LAVAGE   Final   Pneumocystis jiroveci Ag NEGATIVE   Final  FUNGUS CULTURE W SMEAR     Status: None   Collection Time     10/15/13  3:47 PM      Result Value Range Status   Specimen Description BRONCHIAL ALVEOLAR LAVAGE   Final   Special Requests NONE   Final   Fungal Smear     Final   Value: NO YEAST OR FUNGAL ELEMENTS SEEN     Performed at [facility]   Culture     Final   Value: CULTURE IN PROGRESS FOR FOUR WEEKS  Performed at [facility]   Report Status PENDING   Incomplete  RESPIRATORY VIRUS PANEL     Status: None   Collection Time    10/15/13  3:47 PM      Result Value Range Status   Source - [facility]AN BRONCHIAL ALVEOLAR LAVAGE   Corrected   Comment: CORRECTED ON 01/30 AT 2221: PREVIOUSLY REPORTED AS BRONCHIAL ALVEOLAR LAVAGE   Respiratory Syncytial Virus A NOT DETECTED   Final   Respiratory Syncytial Virus B NOT DETECTED   Final   Influenza A NOT DETECTED   Final   Influenza B NOT DETECTED   Final   Parainfluenza 1 NOT DETECTED   Final   Parainfluenza 2 NOT DETECTED   Final   Parainfluenza 3 NOT DETECTED   Final   Metapneumovirus NOT DETECTED   Final   Rhinovirus NOT DETECTED   Final   Adenovirus NOT DETECTED   Final   Influenza A H1 NOT DETECTED   Final   Influenza A H3 NOT DETECTED   Final   Comment: (NOTE)           Normal Reference Range for each Analyte: NOT DETECTED     Testing performed using the Luminex xTAG Respiratory Viral Panel test     kit.     This test was developed and its performance characteristics determined     by [facility]. It has not been cleared or approved by the [place]     Food and Drug Administration. This test is used for clinical purposes.     It should not be regarded as investigational or for research. This     laboratory is certified under the [place] (CLIA) as qualified to perform high complexity     clinical laboratory testing.     Performed at [facility] DIFFICILE BY PCR     Status: None   Collection Time    10/22/13 12:03 PM      Result Value Range Status    C difficile by pcr NEGATIVE  NEGATIVE Final  URINE CULTURE     Status: None   Collection Time    10/22/13  9:15 PM      Result Value Range Status   Specimen Description URINE, CATHETERIZED   Final   Special Requests NONE   Final   Culture  Setup Time     Final   Value: 10/22/2013 22:16     Performed at [place]     Final   Value: NO GROWTH     Performed at [facility]   Culture     Final   Value: NO GROWTH     Performed at [facility]   Report Status 10/23/2013 FINAL   Final  CULTURE, BLOOD (ROUTINE X 2)     Status: None   Collection Time    10/22/13  9:51 PM      Result Value Range Status   Specimen Description BLOOD LEFT ARM   Final   Special Requests BOTTLES DRAWN AEROBIC AND ANAEROBIC 10CC   Final   Culture  Setup Time     Final   Value: 10/23/2013 00:26     Performed at [facility]   Culture     Final   Value: GRAM POSITIVE COCCI IN CLUSTERS     Note: Gram Stain Report Called to,Read Back By and Verified With:  TINA SAVAGE ON 10/23/2013 AT 10:31P BY [employer]     Performed at [facility]   Report Status PENDING   Incomplete  CULTURE, BLOOD (ROUTINE X 2)     Status: None   Collection Time    10/22/13 11:10 PM      Result Value Range Status   Specimen Description BLOOD LEFT FINGER   Final   Special Requests BOTTLES DRAWN AEROBIC ONLY 2CC FROM LEFT THUMB   Final   Culture  Setup Time     Final   Value: 10/23/2013 08:45     Performed at [facility]   Culture     Final   Value:        BLOOD CULTURE RECEIVED NO GROWTH TO DATE CULTURE WILL BE HELD FOR 5 DAYS BEFORE ISSUING A FINAL NEGATIVE REPORT     Performed at [facility]   Report Status PENDING   Incomplete  WET PREP, GENITAL     Status: Abnormal   Collection Time    10/23/13  7:16 PM      Result Value Range Status   Yeast Wet Prep HPF POC NONE SEEN  NONE SEEN Final   Trich, Wet Prep NONE SEEN  NONE SEEN Final   Clue Cells Wet Prep HPF POC  NONE SEEN  NONE SEEN Final   WBC, Wet Prep HPF POC MODERATE (*) NONE SEEN Final    Blood Culture:    Component Value Date/Time   SDES BLOOD LEFT FINGER 10/22/2013 2310   SPECREQUEST BOTTLES DRAWN AEROBIC ONLY 2CC FROM LEFT THUMB 10/22/2013 2310   CULT  Value:        BLOOD CULTURE RECEIVED NO GROWTH TO DATE CULTURE WILL BE HELD FOR 5 DAYS BEFORE ISSUING A FINAL NEGATIVE REPORT Performed at [facility] 10/22/2013 2310   REPTSTATUS PENDING 10/22/2013 2310    Studies/Results: No results found.  Medications:  Scheduled Meds: . folic acid  1 mg Per Tube Daily  . insulin aspart  0-9 Units Subcutaneous Q4H  . levETIRAcetam  500 mg Oral BID  . methylPREDNISolone (SOLU-MEDROL) injection  20 mg Intravenous Q12H  . pantoprazole  40 mg Oral QHS  . pregabalin  300 mg Oral BID  . valACYclovir  1,000 mg Oral BID   Continuous Infusions: . dextrose 50 mL/hr at 10/23/13 2200   PRN Meds:.docusate sodium, fentaNYL, polyethylene glycol, sodium chloride  Antibiotics: Anti-infectives   Start     Dose/Rate Route Frequency Ordered Stop   10/24/13 1400  valACYclovir (VALTREX) tablet 1,000 mg     1,000 mg Oral 2 times daily 10/24/13 1255 11/03/13 0959   10/23/13 2200  acyclovir (ZOVIRAX) tablet 400 mg  Status:  Discontinued     400 mg Oral 3 times daily 10/23/13 1918 10/24/13 1253   10/17/13 1100  cefTRIAXone (ROCEPHIN) 1 g in dextrose 5 % 50 mL IVPB     1 g 100 mL/hr over 30 Minutes Intravenous Every 24 hours 10/17/13 1020 10/21/13 1130   10/15/13 1300  oseltamivir (TAMIFLU) 6 MG/ML suspension 75 mg  Status:  Discontinued     75 mg Oral 2 times daily 10/15/13 1252 10/18/13 0932   10/15/13 1245  oseltamivir (TAMIFLU) capsule 75 mg  Status:  Discontinued     75 mg Oral 2 times daily 10/15/13 1227 10/15/13 1252   10/15/13 0730  vancomycin (VANCOCIN) IVPB 750 mg/150 ml premix  Status:  Discontinued     750 mg 150 mL/hr over 60 Minutes Intravenous  Every 12 hours 10/14/13 2031 10/17/13 1020    10/15/13 0000  azithromycin (ZITHROMAX) 500 mg in dextrose 5 % 250 mL IVPB  Status:  Discontinued     500 mg 250 mL/hr over 60 Minutes Intravenous Every 24 hours 10/14/13 2351 10/16/13 0847   10/14/13 1930  piperacillin-tazobactam (ZOSYN) IVPB 3.375 g  Status:  Discontinued     3.375 g 12.5 mL/hr over 240 Minutes Intravenous 3 times per day 10/14/13 1855 10/17/13 1020   10/14/13 1900  vancomycin (VANCOCIN) IVPB 1000 mg/200 mL premix     1,000 mg 200 mL/hr over 60 Minutes Intravenous  Once 10/14/13 1855 10/14/13 2030   10/14/13 1845  oseltamivir (TAMIFLU) capsule 75 mg     75 mg Oral  Once 10/14/13 1833 10/14/13 1851     Antibiotics Given (last 72 hours)   Date/Time Action Medication Dose   10/23/13 2351 Given   acyclovir (ZOVIRAX) tablet 400 mg 400 mg   10/24/13 1059 Given   acyclovir (ZOVIRAX) tablet 400 mg 400 mg      Day of Hospitalization:  10 Consults: Treatment Team:  Durward Parcel, MD Catarina Hartshorn, MD  Assessment/Plan:    #Acute Delirium - Resolved.  Pt recent transfer from ICU to IMTS.   Pt is able to carry on a normal conversation with [place] today.  We discontinued all of her psychotropic medications and put her back on her regular home meds.  Most likely etiology of acute delirium was combination of medications including precedex.  We cannot rule out encephalitis.   -MRI of brain -continue on home meds -diet dysphagia 1 -monitor  #Severe sepsis secondary to CAP - Resolved.  Pt currently afebrile.  She is saturating 97% RA.  Pt with increasing leukocytosis but is on solu-medrol.  1/2 BCx did grow gpc in clusters but likely contaminant and pt does not look septic.   -consider tapering solumedrol dose   #?Autoimmune Disease - p-ANCA positive; myeloperoxidase 1.2 (H); ESR 98 (but would expect this to be high in acute illness and is non-specific); ANA positive; c3/c4 negative; RF 13 (wnl); anti-DNA 1 (negative); CRP 23.8 (H); cryoglobulin negative; TSH/T4  low; ANA titer 1:80; p-ANCA titer 1:20 (nl=<1:20).  -attempted to consult rheumatology but office is closed on Friday; will try again Monday -further workup as indicated; however, pt is showing improvement in mental status and may be able to worked up further as an outpatient -check ACE levels given CXR findings of hilar adenopathy   #Chronic Pain - Pt complains mainly of back pain that is chronic and states it is due to DDD.  She also has diffuse abdominal tenderness to palpation.   -continue lyrica 364m bid -continue fentanyl patch q2h PRN -CT Abd with oral/IV contrast to r/o any infectious etiology  #Hypokalemia - Mg/phos wnl.  -given 6 runs of K+ -replete as needed -BMP in AM  #Seizure disorder - No seizures while admitted.  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  Ferritin was high but this was check in acute illness and therefore may be falsely elevated.  Retic count wnl.  -continue folate supplementation -check MMA pending  #Dispo- Disposition is deferred at this time, awaiting improvement of current  medical problems.  Anticipated discharge in approximately 1-2 day(s).    LOS: 10 days   Michail Jewels, MD PGY-1, Internal Medicine  [phone] (7AM-5PM Mon-Fri) 10/24/2013, 1:33 PM

## 2013-10-24 NOTE — Progress Notes (Signed)
PT attempted to drink contrast for a ct of abdomen today between 1145-1500 but was unable to drink as she stated she was vomiting it back up. Pt stated maybe she could try again tomorrow. Notified CT and notified Dr. Delane GingerGill.

## 2013-10-25 ENCOUNTER — Encounter (HOSPITAL_COMMUNITY): Payer: Self-pay | Admitting: Radiology

## 2013-10-25 ENCOUNTER — Inpatient Hospital Stay (HOSPITAL_COMMUNITY): Payer: Medicaid Other

## 2013-10-25 DIAGNOSIS — A419 Sepsis, unspecified organism: Principal | ICD-10-CM

## 2013-10-25 LAB — GLUCOSE, CAPILLARY
GLUCOSE-CAPILLARY: 88 mg/dL (ref 70–99)
GLUCOSE-CAPILLARY: 126 mg/dL — AB (ref 70–99)
Glucose-Capillary: 138 mg/dL — ABNORMAL HIGH (ref 70–99)
Glucose-Capillary: 88 mg/dL (ref 70–99)
Glucose-Capillary: 92 mg/dL (ref 70–99)
Glucose-Capillary: 91 mg/dL (ref 70–99)
Glucose-Capillary: 127 mg/dL — ABNORMAL HIGH (ref 70–99)

## 2013-10-25 LAB — BASIC METABOLIC PANEL
BUN: 13 mg/dL (ref 6–23)
CALCIUM: 8.7 mg/dL (ref 8.4–10.5)
CO2: 22 mEq/L (ref 19–32)
Chloride: 103 mEq/L (ref 96–112)
Creatinine, Ser: 0.83 mg/dL (ref 0.50–1.10)
GFR calc non Af Amer: 80 mL/min — ABNORMAL LOW (ref >90)
Glucose, Bld: 61 mg/dL — ABNORMAL LOW (ref 70–99)
Potassium: 3.9 mEq/L (ref 3.7–5.3)
SODIUM: 140 meq/L (ref 137–147)

## 2013-10-25 LAB — CULTURE, BLOOD (ROUTINE X 2)

## 2013-10-25 LAB — LACTIC ACID, PLASMA: Lactic Acid, Venous: 0.8 mmol/L (ref 0.5–2.2)

## 2013-10-25 LAB — CK: Total CK: 31 U/L (ref 7–177)

## 2013-10-25 LAB — MAGNESIUM: MAGNESIUM: 2.3 mg/dL (ref 1.5–2.5)

## 2013-10-25 LAB — ANGIOTENSIN CONVERTING ENZYME: ANGIOTENSIN-CONVERTING ENZYME: 14 U/L (ref 8–52)

## 2013-10-25 LAB — TROPONIN I

## 2013-10-25 MED ORDER — IOHEXOL 300 MG/ML  SOLN
80.0000 mL | Freq: Once | INTRAMUSCULAR | Status: AC | PRN
  Administered 2013-10-25: 100 mL via INTRAVENOUS

## 2013-10-25 MED ORDER — FENTANYL 50 MCG/HR TD PT72
50.0000 ug | MEDICATED_PATCH | TRANSDERMAL | Status: DC
  Administered 2013-10-25: 50 ug via TRANSDERMAL
  Filled 2013-10-25: qty 1

## 2013-10-25 MED ORDER — OXYCODONE-ACETAMINOPHEN 5-325 MG PO TABS
1.0000 | ORAL_TABLET | Freq: Four times a day (QID) | ORAL | Status: AC | PRN
  Administered 2013-10-27: 1 via ORAL
  Administered 2013-10-27 (×2): 2 via ORAL
  Filled 2013-10-25 (×3): qty 2
  Filled 2013-10-25: qty 1

## 2013-10-25 MED ORDER — ENOXAPARIN SODIUM 30 MG/0.3ML ~~LOC~~ SOLN
30.0000 mg | SUBCUTANEOUS | Status: DC
  Administered 2013-10-25 – 2013-10-27 (×3): 30 mg via SUBCUTANEOUS
  Filled 2013-10-25 (×4): qty 0.3

## 2013-10-25 NOTE — Progress Notes (Addendum)
Interim Progress Note.  Subjective- Paged by pts nurse that Tele monitor was showing intermittent junctional rhythm and sinus rhythm. On questioning, pt said she has ben having some chest pain for the past 2 days-substernal and left side of her chest, pressure-like radiating to her jaw, and left arm, she hadnt told anyone because the pain is not severe. No SOB or diaphoresis. EKG- done showing sinus rhythm, rate- 71bpm, regular, sinus rhythm, no concerning St or T wave abnormaities, no signif change from prior.  Objective- Vitals- BP- 90s/60s, Pulse- 70s-80s, saturating 97% on Room air Gen- lying in bed comfortably, not in any distress Cardiac- Per tele, in sinus rhythm, chest pain not reproducible, S1, S2 present.  Assessment and Plan - Bmet - Mag - Troponin X1 - Continue to monitor.  Inocente SallesEjiro Tushar Enns, MD. 10/25/2013 1:42 AM

## 2013-10-25 NOTE — Progress Notes (Signed)
Upon entering patient's room to obtain vital signs, patient noted to be lethargic responding to both verbal and painful stimuli. Patient would open her eyes when name called and would nod yes or no when asked simple questions(are you comfortable/are you in pain) but would not make any attempts to speak. BP 89/56 P 67 R 18 SaO2 97% on room air. CBG 88(recheck 92); Dr Denice BorsEmokoae notified of present findings.

## 2013-10-25 NOTE — Progress Notes (Signed)
10/25/13 2300  What Happened  Was fall witnessed? No  Was patient injured? Yes  Patient found on floor  Found by Staff-comment  Stated prior activity ambulating-unassisted  Follow Up  MD notified Yes  Time MD notified 2150  Family notified No- patient refusal  Additional tests No  Simple treatment Ice  Progress note created (see row info) Yes  Fall Risk Assessment  Risk Factor Category (scoring not indicated) History of more than one fall within 6 months before admission (document High fall risk);High fall risk per protocol (document High fall risk)  Patient's Fall Risk High Fall Risk (>13 points)  Fall Risk Interventions  Required Bundle Interventions *See Row Information* High fall risk - low, moderate, and high requirements implemented  Additional Interventions Secure all tubes/drains;Use of appropriate toileting equipment (bedpan, BSC, etc.);Individualized elimination schedule  Dysrhythmia  Level of Consciousness Alert  Symptoms of Dysrhythmia None  Name of MD Notified Teaching service  Date MD notified 10/25/13  Time MD notified 2150  Pain Assessment  Pain Assessment 0-10  Pain Score 2  Pain Type Acute pain  Pain Location Knee  Pain Orientation Left  Pain Onset With Activity  Patients Stated Pain Goal 0  Pain Intervention(s) Medication (See eMAR);Rest;Other (Comment) (ice)  Neurological  Level of Consciousness Alert  Orientation Level Oriented to person;Oriented to place;Oriented to situation  Cognition Appropriate at baseline;Follows commands  Speech Clear  Motor Function/Sensation Assessment Grip;Motor response  R Hand Grip Present;Moderate  L Hand Grip Present;Moderate   RUE Motor Response Purposeful movement  LUE Motor Response Purposeful movement  RLE Motor Response Purposeful movement  LLE Motor Response Purposeful movement  Glasgow Coma Scale  Eye Opening 4  Best Verbal Response 5  Best Motor Response 6  Glasgow Coma Scale Score 15  Musculoskeletal   Musculoskeletal (WDL) X  Assistive Device None  Generalized Weakness Yes  Weight Bearing Restrictions No  Musculoskeletal Details  Left Knee Swelling  Integumentary  Integumentary (WDL) X  Skin Color Appropriate for ethnicity  Skin Condition Dry  Skin Integrity Intact  Ecchymosis Location Arm;Hand  Ecchymosis Location Orientation Right;Left  Skin Turgor Non-tenting  Pain Assessment  Date Pain First Started 10/25/13  Result of Injury Yes  Pain Screening  Clinical Progression Not changed  Effect of Pain on Daily Activities Can still perform ADLs. Just a little sore  Response to Interventions "the ice helps"  Pain Assessment  Work-Related Injury No   See previous note for additional information

## 2013-10-25 NOTE — Progress Notes (Signed)
Pharmacy Re: Lovenox for VTE prophylaxis.  61", 53 kg.   Scr 0.83, crcl ~ 60 ml/min Hgb 11.2, platelet count 410K on 2/6.  Plan:  - Lovenox 30 mg SQ q24hrs.  Low dose due to low body weight.   - will follow up CBC within 3 days.  Nyaire Denbleyker EganNicolette Bang, RPh Pager: [phone] 10/25/2013 5:40 PM

## 2013-10-25 NOTE — Progress Notes (Signed)
Dr Denice BorsEmokoae at patient's bedside to assess patient's current status. No new orders obtained.

## 2013-10-25 NOTE — Progress Notes (Signed)
Around 2200 pt was heard yelling for help. Upon entering the room, pt was sitting on the floor. The pt stated that she had fallen trying to reach her slippers. She stated that the socks she was wearing were too slick leading to the fall. Pt was assisted back into the bed. She denied hitting her head but did say she landed on her left knee. Upon assessment it was red and slightly swollen. MD was notified and VS were assessed. Charge Nurse informed

## 2013-10-25 NOTE — Progress Notes (Addendum)
Subjective:   patient feels better today and she is very lucid and articulate. She denies any complaints today.   Objective:   Vital signs in last 24 hours: Filed Vitals:   10/25/13 0800 10/25/13 1000 10/25/13 1200 10/25/13 1212  BP: '97/58 97/54 91/64 ' 98/58  Pulse: 64   65  Temp: 98.8 F (37.1 C)   99.2 F (37.3 C)  TempSrc: Oral   Oral  Resp: '18 21 21 24  ' Height: '5\' 1"'  (1.549 m)     Weight: 116 lb 13.5 oz (53 kg)     SpO2: 99%   97%   Weight change:   Intake/Output Summary (Last 24 hours) at 10/25/13 1357 Last data filed at 10/25/13 0800  Gross per 24 hour  Intake   1090 ml  Output    300 ml  Net    790 ml    Physical Exam: Constitutional: Vital signs reviewed.  Alert and orientated  Neck: Supple, Trachea midline Cardiovascular: RRR, S1, S2 present, no MRG, DP 2+ b/l,  Pulmonary/Chest: normal respiratory effort, CTAB, no wheezes, rales, or rhonchi Abdominal: Soft. +BS, mild diffuse tenderness; non-distended Neurological: A&O x3; moving all extremities.  GU: bilateral labial lesions with open sores thought to be due to herpes. No vaginal disharge Extremities: severe tenderness of the calf and thigh muscles bilaterally.  Skin: Warm, dry and intact.  Psychiatric: Normal mood and affect.   Lab Results:  BMP:  Recent Labs Lab 10/21/13 0222  10/24/13 0240 10/25/13 0230  NA 145  < > 143 140  K 3.5*  < > 2.9* 3.9  CL 100  < > 104 103  CO2 25  < > 23 22  GLUCOSE 100*  < > 108* 61*  BUN 13  < > 9 13  CREATININE 0.66  < > 0.66 0.83  CALCIUM 8.7  < > 8.2* 8.7  MG 2.6*  --  2.3 2.3  PHOS 3.2  --  2.7  --   < > = values in this interval not displayed.  CBC:  Recent Labs Lab 10/22/13 1310 10/24/13 0240  WBC 14.6* 15.7*  NEUTROABS  --  12.2*  HGB 12.3 11.2*  HCT 37.9 35.1*  MCV 86.5 87.8  PLT 362 410*    CBG:            Recent Labs Lab 10/24/13 1937 10/25/13 0051 10/25/13 0417 10/25/13 0442 10/25/13 0744 10/25/13 1211  GLUCAP 99 138* 88  92 91 88           LFTs:  Recent Labs Lab 10/22/13 0234 10/24/13 0240  AST 37 26  ALT 86* 61*  ALKPHOS 91 87  BILITOT 0.4 0.5  PROT 5.9* 6.3  ALBUMIN 2.2* 2.6*    Cardiac Enzymes:  Recent Labs Lab 10/25/13 0230  TROPONINI <0.30   Lab Results  Component Value Date   TROPONINI <0.30 10/25/2013    Micro Results: Recent Results (from the past 240 hour(s))  CULTURE, RESPIRATORY (NON-EXPECTORATED)     Status: None   Collection Time    10/15/13  3:47 PM      Result Value Range Status   Specimen Description BRONCHIAL ALVEOLAR LAVAGE   Final   Special Requests NONE   Final   Gram Stain     Final   Value: ABUNDANT WBC PRESENT, PREDOMINANTLY PMN     NO SQUAMOUS EPITHELIAL CELLS SEEN     NO ORGANISMS SEEN     Performed at [facility]  Culture     Final   Value: NO GROWTH 2 DAYS     Performed at [facility]   Report Status 10/17/2013 FINAL   Final  AFB CULTURE WITH SMEAR     Status: None   Collection Time    10/15/13  3:47 PM      Result Value Range Status   Specimen Description BRONCHIAL ALVEOLAR LAVAGE   Final   Special Requests NONE   Final   ACID FAST SMEAR     Final   Value: NO ACID FAST BACILLI SEEN     Performed at [facility]   Culture     Final   Value: CULTURE WILL BE EXAMINED FOR 6 WEEKS BEFORE ISSUING A FINAL REPORT     Performed at [facility]   Report Status PENDING   Incomplete  PNEUMOCYSTIS JIROVECI SMEAR BY DFA     Status: None   Collection Time    10/15/13  3:47 PM      Result Value Range Status   Specimen Source-PJSRC BRONCHIAL ALVEOLAR LAVAGE   Final   Pneumocystis jiroveci Ag NEGATIVE   Final  FUNGUS CULTURE W SMEAR     Status: None   Collection Time    10/15/13  3:47 PM      Result Value Range Status   Specimen Description BRONCHIAL ALVEOLAR LAVAGE   Final   Special Requests NONE   Final   Fungal Smear     Final   Value: NO YEAST OR FUNGAL ELEMENTS SEEN     Performed at [facility]   Culture      Final   Value: CULTURE IN PROGRESS FOR FOUR WEEKS     Performed at [facility]   Report Status PENDING   Incomplete  RESPIRATORY VIRUS PANEL     Status: None   Collection Time    10/15/13  3:47 PM      Result Value Range Status   Source - [facility]AN BRONCHIAL ALVEOLAR LAVAGE   Corrected   Comment: CORRECTED ON 01/30 AT 2221: PREVIOUSLY REPORTED AS BRONCHIAL ALVEOLAR LAVAGE   Respiratory Syncytial Virus A NOT DETECTED   Final   Respiratory Syncytial Virus B NOT DETECTED   Final   Influenza A NOT DETECTED   Final   Influenza B NOT DETECTED   Final   Parainfluenza 1 NOT DETECTED   Final   Parainfluenza 2 NOT DETECTED   Final   Parainfluenza 3 NOT DETECTED   Final   Metapneumovirus NOT DETECTED   Final   Rhinovirus NOT DETECTED   Final   Adenovirus NOT DETECTED   Final   Influenza A H1 NOT DETECTED   Final   Influenza A H3 NOT DETECTED   Final   Comment: (NOTE)           Normal Reference Range for each Analyte: NOT DETECTED     Testing performed using the Luminex xTAG Respiratory Viral Panel test     kit.     This test was developed and its performance characteristics determined     by [facility]. It has not been cleared or approved by the [place]     Food and Drug Administration. This test is used for clinical purposes.     It should not be regarded as investigational or for research. This     laboratory is certified under the [place] (CLIA) as qualified to  perform high complexity     clinical laboratory testing.     Performed at [facility] DIFFICILE BY PCR     Status: None   Collection Time    10/22/13 12:03 PM      Result Value Range Status   C difficile by pcr NEGATIVE  NEGATIVE Final  URINE CULTURE     Status: None   Collection Time    10/22/13  9:15 PM      Result Value Range Status   Specimen Description URINE, CATHETERIZED   Final   Special Requests NONE   Final   Culture  Setup Time      Final   Value: 10/22/2013 22:16     Performed at [facility]     Final   Value: NO GROWTH     Performed at [facility]   Culture     Final   Value: NO GROWTH     Performed at [facility]   Report Status 10/23/2013 FINAL   Final  CULTURE, BLOOD (ROUTINE X 2)     Status: None   Collection Time    10/22/13  9:51 PM      Result Value Range Status   Specimen Description BLOOD LEFT ARM   Final   Special Requests BOTTLES DRAWN AEROBIC AND ANAEROBIC 10CC   Final   Culture  Setup Time     Final   Value: 10/23/2013 00:26     Performed at [facility]   Culture     Final   Value: STAPHYLOCOCCUS SPECIES (COAGULASE NEGATIVE)     Note: THE SIGNIFICANCE OF ISOLATING THIS ORGANISM FROM A SINGLE SET OF BLOOD CULTURES WHEN MULTIPLE SETS ARE DRAWN IS UNCERTAIN. PLEASE NOTIFY THE MICROBIOLOGY DEPARTMENT WITHIN ONE WEEK IF SPECIATION AND SENSITIVITIES ARE REQUIRED.     Note: Gram Stain Report Called to,Read Back By and Verified With: TINA SAVAGE ON 10/23/2013 AT 10:31P BY WILEJ     Performed at [facility]   Report Status 10/25/2013 FINAL   Final  CULTURE, BLOOD (ROUTINE X 2)     Status: None   Collection Time    10/22/13 11:10 PM      Result Value Range Status   Specimen Description BLOOD LEFT FINGER   Final   Special Requests BOTTLES DRAWN AEROBIC ONLY 2CC FROM LEFT THUMB   Final   Culture  Setup Time     Final   Value: 10/23/2013 08:45     Performed at [facility]   Culture     Final   Value:        BLOOD CULTURE RECEIVED NO GROWTH TO DATE CULTURE WILL BE HELD FOR 5 DAYS BEFORE ISSUING A FINAL NEGATIVE REPORT     Performed at [facility]   Report Status PENDING   Incomplete  WET PREP, GENITAL     Status: Abnormal   Collection Time    10/23/13  7:16 PM      Result Value Range Status   Yeast Wet Prep HPF POC NONE SEEN  NONE SEEN Final   Trich, Wet Prep NONE SEEN  NONE SEEN Final   Clue Cells Wet Prep HPF POC NONE SEEN   NONE SEEN Final   WBC, Wet Prep HPF POC MODERATE (*) NONE SEEN Final  CULTURE, BLOOD (ROUTINE X 2)     Status: None   Collection Time    10/24/13  2:40 AM  Result Value Range Status   Specimen Description BLOOD RIGHT ARM   Final   Special Requests BOTTLES DRAWN AEROBIC ONLY 10CC   Final   Culture  Setup Time     Final   Value: 10/24/2013 08:30     Performed at [facility]   Culture     Final   Value:        BLOOD CULTURE RECEIVED NO GROWTH TO DATE CULTURE WILL BE HELD FOR 5 DAYS BEFORE ISSUING A FINAL NEGATIVE REPORT     Performed at [facility]   Report Status PENDING   Incomplete  CULTURE, BLOOD (ROUTINE X 2)     Status: None   Collection Time    10/24/13  2:50 AM      Result Value Range Status   Specimen Description BLOOD RIGHT HAND   Final   Special Requests     Final   Value: BOTTLES DRAWN AEROBIC AND ANAEROBIC 10CC BLUE,7CC RED   Culture  Setup Time     Final   Value: 10/24/2013 08:30     Performed at [facility]   Culture     Final   Value:        BLOOD CULTURE RECEIVED NO GROWTH TO DATE CULTURE WILL BE HELD FOR 5 DAYS BEFORE ISSUING A FINAL NEGATIVE REPORT     Performed at [facility]   Report Status PENDING   Incomplete    Blood Culture:    Component Value Date/Time   SDES BLOOD RIGHT HAND 10/24/2013 0250   SPECREQUEST BOTTLES DRAWN AEROBIC AND ANAEROBIC 10CC BLUE,7CC RED 10/24/2013 0250   CULT  Value:        BLOOD CULTURE RECEIVED NO GROWTH TO DATE CULTURE WILL BE HELD FOR 5 DAYS BEFORE ISSUING A FINAL NEGATIVE REPORT Performed at [place] 10/24/2013 0250   REPTSTATUS PENDING 10/24/2013 0250    Studies/Results: Mr Brain W Wo Contrast  10/24/2013   CLINICAL DATA:  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 Question sarcoidosis. Bacteremia.  EXAM: MRI HEAD WITHOUT AND WITH CONTRAST  TECHNIQUE: Multiplanar, multiecho pulse sequences of the brain and surrounding structures were obtained without and with intravenous contrast.  CONTRAST:   44m MULTIHANCE GADOBENATE DIMEGLUMINE 529 MG/ML IV SOLN  COMPARISON:  CT head 10/15/2013  FINDINGS: Image quality degraded by mild motion.  Subcortical hyperintensities in the left parietal white matter are nonspecific. These are non masslike and do not enhance. Remaining white matter is normal. Brainstem and cerebellum are normal.  Negative for acute infarct.  Negative for hemorrhage or mass lesion.  No edema in the brain.  Postcontrast imaging reveals normal enhancement. No enhancing mass or leptomeningeal thickening is identified. No evidence of neurosarcoidosis.  IMPRESSION: Patchy subcortical hyperintensities in the left parietal white matter. These are nonspecific and could be seen with chronic microvascular ischemia. They do not appear to be acute and do not enhance.  No acute infarct or mass.  Normal enhancement.   Electronically Signed   By: CFranchot GalloM.D.   On: 10/24/2013 20:59   Ct Abdomen Pelvis W Contrast  10/25/2013   CLINICAL DATA:  53year old female with but identified source of infection. Vomiting. Left lower quadrant pain. Initial encounter.  EXAM: CT ABDOMEN AND PELVIS WITH CONTRAST  TECHNIQUE: Multidetector CT imaging of the abdomen and pelvis was performed using the standard protocol following bolus administration of intravenous contrast.  CONTRAST:  1057mOMNIPAQUE IOHEXOL 300 MG/ML  SOLN  COMPARISON:  KUB 10/15/2013 and earlier. Portable  chest radiographs 10/21/2013.  FINDINGS: Mild motion artifact at the lung bases. No pericardial or pleural effusion. Minimal mostly dependent lower lobe opacity. No consolidation.  No acute osseous abnormality identified.  Moderate volume of gas within the bladder (series 2, image 72). Bladder distension with hyperdense fluid. Sagittal images suggest some perivesical fat stranding (series 7 image 67).  Uterus is surgically absent. No definite pelvic free fluid. Oral contrast has reached the proximal rectum. Intermittent gaseous distension and  decompression of the sigmoid colon, with no definite sigmoid inflammation. Similar appearance of the distal left colon. Splenic flexure has a more normal appearance. Redundant transverse colon. Negative right colon. Appendix is looped upon itself but appears within normal limits. No dilated small bowel. Mildly distended stomach. Duodenum within normal limits.  Streak artifact through the upper abdomen. Gallbladder surgically absent. Negative liver, spleen, pancreas and adrenal glands. Multiple bilateral intra renal calculi. No obstructive uropathy. Portal venous system within normal limits.  The abdominal aorta is patent with soft plaque distally. 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 The left external iliac artery also likely is thrombosed. There is evidence of reconstituted flow at the left common femoral artery. The right iliac arteries appear patent with atherosclerosis.  No abdominal free fluid. Some mesenteric haziness is noted in the upper abdomen, nonspecific. No lymphadenopathy identified.  IMPRESSION: 1. Gas within the bladder, and suggestion of some perivesical stranding, most compatible with acute urinary infection unless there has been recent bladder catheterization. 2. Hyperdense fluid within the bladder, with dense bladder contrast noted on 10/15/2013. Suspect today's density is excreted contrast rather than hematuria. Clinical correlation recommended. 3. Nephrolithiasis but no evidence of obstructive uropathy. 4. 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 Flow is reconstituted at the left common femoral artery.   Electronically Signed   By: Lars Pinks M.D.   On: 10/25/2013 02:16    Medications:  Scheduled Meds: . folic acid  1 mg Per Tube Daily  . insulin aspart  0-9 Units Subcutaneous Q4H  . levETIRAcetam  500 mg Oral BID  . pantoprazole  40 mg Oral QHS  . predniSONE  60 mg Oral Q breakfast  . pregabalin  300 mg Oral BID  . valACYclovir  1,000 mg Oral BID    Continuous Infusions: . dextrose 50 mL/hr at 10/25/13 0700   PRN Meds:.docusate sodium, fentaNYL, ondansetron (ZOFRAN) IV, polyethylene glycol, sodium chloride  Antibiotics: Anti-infectives   Start     Dose/Rate Route Frequency Ordered Stop   10/24/13 1400  valACYclovir (VALTREX) tablet 1,000 mg     1,000 mg Oral 2 times daily 10/24/13 1255 11/03/13 0959   10/23/13 2200  acyclovir (ZOVIRAX) tablet 400 mg  Status:  Discontinued     400 mg Oral 3 times daily 10/23/13 1918 10/24/13 1253   10/17/13 1100  cefTRIAXone (ROCEPHIN) 1 g in dextrose 5 % 50 mL IVPB     1 g 100 mL/hr over 30 Minutes Intravenous Every 24 hours 10/17/13 1020 10/21/13 1130   10/15/13 1300  oseltamivir (TAMIFLU) 6 MG/ML suspension 75 mg  Status:  Discontinued     75 mg Oral 2 times daily 10/15/13 1252 10/18/13 0932   10/15/13 1245  oseltamivir (TAMIFLU) capsule 75 mg  Status:  Discontinued     75 mg Oral 2 times daily 10/15/13 1227 10/15/13 1252   10/15/13 0730  vancomycin (VANCOCIN) IVPB 750 mg/150 ml premix  Status:  Discontinued     750 mg 150 mL/hr over 60  Minutes Intravenous Every 12 hours 10/14/13 2031 10/17/13 1020   10/15/13 0000  azithromycin (ZITHROMAX) 500 mg in dextrose 5 % 250 mL IVPB  Status:  Discontinued     500 mg 250 mL/hr over 60 Minutes Intravenous Every 24 hours 10/14/13 2351 10/16/13 0847   10/14/13 1930  piperacillin-tazobactam (ZOSYN) IVPB 3.375 g  Status:  Discontinued     3.375 g 12.5 mL/hr over 240 Minutes Intravenous 3 times per day 10/14/13 1855 10/17/13 1020   10/14/13 1900  vancomycin (VANCOCIN) IVPB 1000 mg/200 mL premix     1,000 mg 200 mL/hr over 60 Minutes Intravenous  Once 10/14/13 1855 10/14/13 2030   10/14/13 1845  oseltamivir (TAMIFLU) capsule 75 mg     75 mg Oral  Once 10/14/13 1833 10/14/13 1851     Antibiotics Given (last 72 hours)   Date/Time Action Medication Dose   10/23/13 2351 Given   acyclovir (ZOVIRAX) tablet 400 mg 400 mg   10/24/13 1059 Given   acyclovir  (ZOVIRAX) tablet 400 mg 400 mg   10/24/13 1546 Given  [pharmacy delay]   valACYclovir (VALTREX) tablet 1,000 mg 1,000 mg   10/24/13 2152 Given   valACYclovir (VALTREX) tablet 1,000 mg 1,000 mg   10/25/13 1121 Given   valACYclovir (VALTREX) tablet 1,000 mg 1,000 mg      Day of Hospitalization:  11 Consults: Treatment Team:  Durward Parcel, MD  Assessment/Plan:   #Acute Delirium - Resolved after hold psych medications.  She is very lucid and articulate this morning. Brain MRI 2/6 was normal. Differential diagnosis for her delirium include post-ICU delirium, medications side effects, and the possibility with immune syndrome. Plan -Appreciate neurology recommendations -continue on home meds -diet dysphagia 1 -Avoid any medications that can worsen delirium like benzos, narcotics and antipsychotics. Patient can continue with her [place] for her seizures. - D/c fentayl  - transfer to med-surg   #Severe sepsis secondary to CAP - Resolved.  Pt currently afebrile.  She is saturating 97% RA.  No identifiable source of infection including a normal CT abdomen/pelvis performed on 10/24/2013.  She had 1/2 BCx growing gram positive cocci. Most likely this was a contaminant. We are repeating blood cultures - results are pending.  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 Possible difference in diagnosis have been entertained including microscopic polyangiitis, and sarcoidosis given her chest imaging revealing mediastinal lymphadenopathy. Plan -Dr. Tommy Medal discussed with a rheumatologist who recommended changing her solu-Medrol to oral prednisone 60 mg daily. -check ACE levels given CXR findings of hilar adenopathy - check CK to  rule out rhabdomyolysis as a potential cause of her lower extremity tenderness.   # Chronic Pain - Pt complains mainly of back pain that is chronic and states it is due to DDD.  She also has diffuse abdominal tenderness to palpation.  Abdominal/pelvis CT scan performed on 10/24/2013 did not reveal any source of infection. However, there was perivesical stranding, most compatible with urinary retention versus recent bladder catheterization. The Foley was removed 3 days ago , and 2 days prior to imaging. Plan -Check urinalysis -continue lyrica 335m bid  #Hypokalemia - Mg/phos wnl.  -Monitor and replete as needed  #Seizure disorder - No seizures while admitted.  Appreciate neurology recommendations. 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 Initially, had been started on Acyclovir 400 mg 3 times a day, but due to concern of possible encephalitis change this on 10/24/2013 to Valtrex. HSV 1 & 2 IgG positive  -Continue with valacyclovir 1085m bid x 10 days, end date 11/03/2012   #Deconditioning - PT recommends HH PT -continue PT/OT  #Anemia of chronic disease - Pt also with folate deficiency and probable B12.  Ferritin was high but this was check in acute illness and therefore may be falsely elevated.  Retic count wnl.  -continue folate supplementation -check MMA pending  #Dispo- Disposition is deferred at this time, awaiting improvement of current medical problems.  Anticipated discharge in approximately 1-2 day(s).    LOS: 11 days   RJessee Avers MD PGY-2, Internal Medicine  10/25/2013, 1:57 PM

## 2013-10-25 NOTE — Progress Notes (Signed)
Subjective: Patient is complaining of having sore all over. She has become increasingly more verbal and conversant, as well as alert.  Objective: Current vital signs: BP 89/52  Pulse 68  Temp(Src) 98.8 F (37.1 C) (Oral)  Resp 21  Ht 5\' 1"  (1.549 m)  Wt 53.7 kg (118 lb 6.2 oz)  BMI 22.38 kg/m2  SpO2 98%  Neurologic Exam: Alert and in no acute distress. Patient is well-oriented to place and person but slightly disoriented to time. Extraocular movements were full and conjugate. No facial weakness was noted. Patient was slightly hoarse, but speech was otherwise normal. Patient moved extremities equally with normal strength throughout. No tremor was noted.  Medications: I have reviewed the patient's current medications.  MRI of the brain on 10/24/2013 showed no acute intracranial abnormality. Nonspecific white matter changes were noted involving the left parietal region which were thought to be chronic microvascular changes.  Assessment/Plan: Encephalopathic state with marked improvement. She is no focal deficits at this point. Speech is back to normal with no indications of aphasia. Patient also been no difficulty with swallowing at this point. Likely etiology for encephalopathy with delirium was reaction to medications, particularly sedating medications used in the intensive care unit.  Recommend no changes in current management, including continuing Keppra for chronic seizure disorder.  No further neurological intervention is indicated at this point. I will plan to see her on a when necessary basis at this visit.  C.R. Roseanne RenoStewart, MD [facility] [phone]  10/25/2013  8:46 AM

## 2013-10-25 NOTE — Progress Notes (Signed)
IV team paged. Call returned at 1621. No IV access. Confirmed that they will come by to see the pt

## 2013-10-25 NOTE — Progress Notes (Signed)
  Date: 10/25/2013  Patient name: [name] [name]  Medical record number: [id number]  Date of birth: 01/16/1961   I have seen and evaluated [name] CaulNieves Coats and discussed their care with the Residency Team.   Assessment and Plan: I have seen and evaluated the patient as outlined above. I agree with the formulated Assessment and Plan as detailed in the residents' admission note, with the following changes:   Patient continues to improve on daily basis. She is fairly lucid and conversant but not completely oriented to exact month and year.   Her MRI brain without any acute pathology whatsoever.  CT the abdomen and pelvis was reassuring that there is no intra-abdominal abscess occult infection in that site. Her bladder had gas from recent catheterization and she has kidney stones.  WE will get urine analysis and culture reflex  Continue with prednisone. Her ACE level was normal but Sarcoid still high in differential along with P-ANCA vaculitis  Dr. Dierdre ForthBeekman discussed case with me over phone last night. He suggested that if this ends up being a P-ANCA vasculitis it may be easier to treat in terms of coverage in [name] they apparently have single payer government system vs in [place] where this pt has no insurance. She should however be able to afford prednisone on short term  I think we can get rid of telemetry and move her to a floor bed  [name], MD 2/7/20151:46 PM

## 2013-10-26 ENCOUNTER — Telehealth: Payer: Self-pay | Admitting: Internal Medicine

## 2013-10-26 LAB — CBC
HEMATOCRIT: 32.9 % — AB (ref 36.0–46.0)
HEMOGLOBIN: 10.3 g/dL — AB (ref 12.0–15.0)
MCH: 28.1 pg (ref 26.0–34.0)
MCHC: 31.3 g/dL (ref 30.0–36.0)
MCV: 89.9 fL (ref 78.0–100.0)
Platelets: 355 10*3/uL (ref 150–400)
RBC: 3.66 MIL/uL — ABNORMAL LOW (ref 3.87–5.11)
RDW: 17 % — AB (ref 11.5–15.5)
WBC: 11.1 10*3/uL — AB (ref 4.0–10.5)

## 2013-10-26 LAB — URINALYSIS, ROUTINE W REFLEX MICROSCOPIC
GLUCOSE, UA: NEGATIVE mg/dL
Ketones, ur: 15 mg/dL — AB
Nitrite: NEGATIVE
Protein, ur: 100 mg/dL — AB
SPECIFIC GRAVITY, URINE: 1.027 (ref 1.005–1.030)
Urobilinogen, UA: 0.2 mg/dL (ref 0.0–1.0)
pH: 6 (ref 5.0–8.0)

## 2013-10-26 LAB — BASIC METABOLIC PANEL
BUN: 11 mg/dL (ref 6–23)
CHLORIDE: 103 meq/L (ref 96–112)
CO2: 23 mEq/L (ref 19–32)
Calcium: 8.6 mg/dL (ref 8.4–10.5)
Creatinine, Ser: 0.8 mg/dL (ref 0.50–1.10)
GFR, EST NON AFRICAN AMERICAN: 84 mL/min — AB (ref >90)
Glucose, Bld: 103 mg/dL — ABNORMAL HIGH (ref 70–99)
POTASSIUM: 3.4 meq/L — AB (ref 3.7–5.3)
SODIUM: 139 meq/L (ref 137–147)

## 2013-10-26 LAB — GLUCOSE, CAPILLARY
GLUCOSE-CAPILLARY: 86 mg/dL (ref 70–99)
Glucose-Capillary: 105 mg/dL — ABNORMAL HIGH (ref 70–99)
Glucose-Capillary: 134 mg/dL — ABNORMAL HIGH (ref 70–99)
Glucose-Capillary: 136 mg/dL — ABNORMAL HIGH (ref 70–99)
Glucose-Capillary: 148 mg/dL — ABNORMAL HIGH (ref 70–99)
Glucose-Capillary: 97 mg/dL (ref 70–99)

## 2013-10-26 LAB — URINE MICROSCOPIC-ADD ON

## 2013-10-26 LAB — PROTEIN / CREATININE RATIO, URINE
CREATININE, URINE: 121.76 mg/dL
PROTEIN CREATININE RATIO: 0.78 — AB (ref 0.00–0.15)
Total Protein, Urine: 95.3 mg/dL

## 2013-10-26 MED ORDER — CYANOCOBALAMIN 1000 MCG/ML IJ SOLN
1000.0000 ug | Freq: Every day | INTRAMUSCULAR | Status: DC
  Administered 2013-10-26 – 2013-10-28 (×3): 1000 ug via INTRAMUSCULAR
  Filled 2013-10-26 (×4): qty 1

## 2013-10-26 MED ORDER — POTASSIUM CHLORIDE 10 MEQ/100ML IV SOLN
10.0000 meq | INTRAVENOUS | Status: AC
  Administered 2013-10-26 (×6): 10 meq via INTRAVENOUS
  Filled 2013-10-26 (×7): qty 100

## 2013-10-26 NOTE — Progress Notes (Addendum)
Pt c/o unable to urinate since yesterday, bladder scan show 492. MD called for order to I&O cath.

## 2013-10-26 NOTE — Progress Notes (Signed)
Pt bladder scan per order 386 cc, pt able to urinate 320. Post void bladder scan showed 30.

## 2013-10-26 NOTE — Progress Notes (Signed)
Physical Therapy Treatment Patient Details Name: [name] [name] MRN: [id number] DOB: 05/14/1961 Today's Date: 10/26/2013 Time: [id number] PT Time Calculation (min): 25 min  PT Assessment / Plan / Recommendation  History of Present Illness     PT Comments   Pt more alert and verbal as compared to initial eval.  Pt progressing toward goals.    Follow Up Recommendations  Home health PT;Supervision/Assistance - 24 hour     Does the patient have the potential to tolerate intense rehabilitation     Barriers to Discharge        Equipment Recommendations  None recommended by PT    Recommendations for Other Services    Frequency Min 3X/week   Progress towards PT Goals Progress towards PT goals: Progressing toward goals  Plan Current plan remains appropriate    Precautions / Restrictions Precautions Precautions: Fall   Pertinent Vitals/Pain 0/10    Mobility  Bed Mobility Bed Mobility: Sit to Supine Supine to sit: Min guard Sit to supine: Min guard Transfers Sit to Stand: Min assist Stand pivot transfers: Min assist General transfer comment: verbal cues for hand placement, safety Ambulation/Gait Ambulation/Gait assistance: Mod assist;+2 safety/equipment Ambulation Distance (Feet): 25 Feet Assistive device: Rolling walker (2 wheeled) Gait Pattern/deviations: Scissoring Gait velocity interpretation: <1.8 ft/sec, indicative of risk for recurrent falls    Exercises     PT Diagnosis:    PT Problem List:   PT Treatment Interventions:     PT Goals (current goals can now be found in the care plan section)    Visit Information  Last PT Received On: 10/26/13 Assistance Needed: +2 (safety with ambulation)    Subjective Data      Cognition  Cognition Arousal/Alertness: Awake/alert Behavior During Therapy: WFL for tasks assessed/performed Overall Cognitive Status: Within Functional Limits for tasks assessed    Balance     End of Session PT - End of Session Equipment  Utilized During Treatment: Gait belt Activity Tolerance: Patient limited by fatigue Patient left: in chair;with call bell/phone within reach;with family/visitor present Nurse Communication: Mobility status   GP     Ilda FoilGarrow, [name] 10/26/2013, 12:51 PM  Aida RaiderWendy Deontra Pereyra, PT  Office # [phone] Pager [phone]

## 2013-10-26 NOTE — Progress Notes (Signed)
Subjective:   Pt looks much better this AM and is alert and oriented.  She states that she has a sister with MS.  She also reports having chronically low BP and a FH of B12 deficiency.  She had a seizure disorder since she was a teenager and reports tonic-clonic like episodes.  Additionally, she also states she has memory loss that has been gradually progressive and paresthesias in the LE.  She has not seen a physician in years because she is afraid to know what is going on with her.     Objective:   Vital signs in last 24 hours: Filed Vitals:   10/26/13 0459 10/26/13 0822 10/26/13 1000 10/26/13 1300  BP: 103/66  107/63 113/77  Pulse: 55  71 80  Temp: 98.2 F (36.8 C)  98.3 F (36.8 C) 98.3 F (36.8 C)  TempSrc: Oral  Oral Oral  Resp: '20  20 18  ' Height:      Weight:  129 lb 6.6 oz (58.7 kg)    SpO2: 93%  96% 98%   Weight change:   Intake/Output Summary (Last 24 hours) at 10/26/13 2122 Last data filed at 10/26/13 1900  Gross per 24 hour  Intake 2891.67 ml  Output    975 ml  Net 1916.67 ml    Physical Exam: Constitutional: Vital signs reviewed.  Patient is in no acute distress and cooperative with exam.   Head: Normocephalic and atraumatic Eyes: EOMI, conjunctivae normal, no scleral icterus  Neck: Supple, Trachea midline Cardiovascular: RRR, S1, S2 present, no MRG, DP 2+ b/l,  Pulmonary/Chest: normal respiratory effort, CTAB, no wheezes, rales, or rhonchi Abdominal: Soft. +BS, Diffuse tenderness; non-distended Neurological: A&O x3; CNII-XII are grossly intact, moving all extremities  GU: bilateral labial lesions with open sores  Skin: Warm, dry and intact.  Psychiatric: Normal mood and affect.   Lab Results:  BMP:  Recent Labs Lab 10/21/13 0222  10/24/13 0240 10/25/13 0230 10/26/13 0525  NA 145  < > 143 140 139  K 3.5*  < > 2.9* 3.9 3.4*  CL 100  < > 104 103 103  CO2 25  < > '23 22 23  ' GLUCOSE 100*  < > 108* 61* 103*  BUN 13  < > '9 13 11  ' CREATININE 0.66   < > 0.66 0.83 0.80  CALCIUM 8.7  < > 8.2* 8.7 8.6  MG 2.6*  --  2.3 2.3  --   PHOS 3.2  --  2.7  --   --   < > = values in this interval not displayed.  CBC:  Recent Labs Lab 10/24/13 0240 10/26/13 0525  WBC 15.7* 11.1*  NEUTROABS 12.2*  --   HGB 11.2* 10.3*  HCT 35.1* 32.9*  MCV 87.8 89.9  PLT 410* 355    Coagulation: No results found for this basename: LABPROT, INR,  in the last 168 hours  CBG:            Recent Labs Lab 10/25/13 2119 10/26/13 0048 10/26/13 0542 10/26/13 0801 10/26/13 1207 10/26/13 1707  GLUCAP 126* 134* 97 86 148* 136*           HA1C:      No results found for this basename: HGBA1C,  in the last 168 hours  Lipid Panel: No results found for this basename: CHOL, HDL, LDLCALC, TRIG, CHOLHDL, LDLDIRECT,  in the last 168 hours  LFTs:  Recent Labs Lab 10/22/13 0234 10/24/13 0240  AST 37 26  ALT 86*  61*  ALKPHOS 91 87  BILITOT 0.4 0.5  PROT 5.9* 6.3  ALBUMIN 2.2* 2.6*    Pancreatic Enzymes: No results found for this basename: LIPASE, AMYLASE,  in the last 168 hours  Ammonia: No results found for this basename: AMMONIA,  in the last 168 hours  Cardiac Enzymes:  Recent Labs Lab 10/25/13 0230  CKTOTAL 61  TROPONINI <0.30   Lab Results  Component Value Date   CKTOTAL 31 10/25/2013   TROPONINI <0.30 10/25/2013    EKG:  Date/Time:    Ventricular Rate:    PR Interval:    QRS Duration:   QT Interval:    QTC Calculation:   R Axis:     Text Interpretation:    BNP: No results found for this basename: PROBNP,  in the last 168 hours  D-Dimer: No results found for this basename: DDIMER,  in the last 168 hours  Urinalysis:  Recent Labs Lab 10/26/13 0836  COLORURINE RED*  LABSPEC 1.027  PHURINE 6.0  GLUCOSEU NEGATIVE  HGBUR LARGE*  BILIRUBINUR MODERATE*  KETONESUR 15*  PROTEINUR 100*  UROBILINOGEN 0.2  NITRITE NEGATIVE  LEUKOCYTESUR MODERATE*    Micro Results: Recent Results (from the past 240 hour(s))    CLOSTRIDIUM DIFFICILE BY PCR     Status: None   Collection Time    10/22/13 12:03 PM      Result Value Range Status   C difficile by pcr NEGATIVE  NEGATIVE Final  URINE CULTURE     Status: None   Collection Time    10/22/13  9:15 PM      Result Value Range Status   Specimen Description URINE, CATHETERIZED   Final   Special Requests NONE   Final   Culture  Setup Time     Final   Value: 10/22/2013 22:16     Performed at [place]     Final   Value: NO GROWTH     Performed at [facility]   Culture     Final   Value: NO GROWTH     Performed at [facility]   Report Status 10/23/2013 FINAL   Final  CULTURE, BLOOD (ROUTINE X 2)     Status: None   Collection Time    10/22/13  9:51 PM      Result Value Range Status   Specimen Description BLOOD LEFT ARM   Final   Special Requests BOTTLES DRAWN AEROBIC AND ANAEROBIC 10CC   Final   Culture  Setup Time     Final   Value: 10/23/2013 00:26     Performed at [facility]   Culture     Final   Value: STAPHYLOCOCCUS SPECIES (COAGULASE NEGATIVE)     Note: THE SIGNIFICANCE OF ISOLATING THIS ORGANISM FROM A SINGLE SET OF BLOOD CULTURES WHEN MULTIPLE SETS ARE DRAWN IS UNCERTAIN. PLEASE NOTIFY THE MICROBIOLOGY DEPARTMENT WITHIN ONE WEEK IF SPECIATION AND SENSITIVITIES ARE REQUIRED.     Note: Gram Stain Report Called to,Read Back By and Verified With: TINA SAVAGE ON 10/23/2013 AT 10:31P BY WILEJ     Performed at [facility]   Report Status 10/25/2013 FINAL   Final  CULTURE, BLOOD (ROUTINE X 2)     Status: None   Collection Time    10/22/13 11:10 PM      Result Value Range Status   Specimen Description BLOOD LEFT FINGER   Final   Special Requests BOTTLES DRAWN AEROBIC ONLY  2CC FROM LEFT THUMB   Final   Culture  Setup Time     Final   Value: 10/23/2013 08:45     Performed at [facility]   Culture     Final   Value:        BLOOD CULTURE RECEIVED NO GROWTH TO DATE CULTURE WILL BE  HELD FOR 5 DAYS BEFORE ISSUING A FINAL NEGATIVE REPORT     Performed at [facility]   Report Status PENDING   Incomplete  WET PREP, GENITAL     Status: Abnormal   Collection Time    10/23/13  7:16 PM      Result Value Range Status   Yeast Wet Prep HPF POC NONE SEEN  NONE SEEN Final   Trich, Wet Prep NONE SEEN  NONE SEEN Final   Clue Cells Wet Prep HPF POC NONE SEEN  NONE SEEN Final   WBC, Wet Prep HPF POC MODERATE (*) NONE SEEN Final  CULTURE, BLOOD (ROUTINE X 2)     Status: None   Collection Time    10/24/13  2:40 AM      Result Value Range Status   Specimen Description BLOOD RIGHT ARM   Final   Special Requests BOTTLES DRAWN AEROBIC ONLY 10CC   Final   Culture  Setup Time     Final   Value: 10/24/2013 08:30     Performed at [facility]   Culture     Final   Value:        BLOOD CULTURE RECEIVED NO GROWTH TO DATE CULTURE WILL BE HELD FOR 5 DAYS BEFORE ISSUING A FINAL NEGATIVE REPORT     Performed at [facility]   Report Status PENDING   Incomplete  CULTURE, BLOOD (ROUTINE X 2)     Status: None   Collection Time    10/24/13  2:50 AM      Result Value Range Status   Specimen Description BLOOD RIGHT HAND   Final   Special Requests     Final   Value: BOTTLES DRAWN AEROBIC AND ANAEROBIC 10CC BLUE,7CC RED   Culture  Setup Time     Final   Value: 10/24/2013 08:30     Performed at [facility]   Culture     Final   Value:        BLOOD CULTURE RECEIVED NO GROWTH TO DATE CULTURE WILL BE HELD FOR 5 DAYS BEFORE ISSUING A FINAL NEGATIVE REPORT     Performed at [facility]   Report Status PENDING   Incomplete    Blood Culture:    Component Value Date/Time   SDES BLOOD RIGHT HAND 10/24/2013 0250   SPECREQUEST BOTTLES DRAWN AEROBIC AND ANAEROBIC 10CC BLUE,7CC RED 10/24/2013 0250   CULT  Value:        BLOOD CULTURE RECEIVED NO GROWTH TO DATE CULTURE WILL BE HELD FOR 5 DAYS BEFORE ISSUING A FINAL NEGATIVE REPORT Performed at [facility]  10/24/2013 0250   REPTSTATUS PENDING 10/24/2013 0250    Studies/Results: Ct Abdomen Pelvis W Contrast  10/25/2013   CLINICAL DATA:  53 year old female with but identified source of infection. Vomiting. Left lower quadrant pain. Initial encounter.  EXAM: CT ABDOMEN AND PELVIS WITH CONTRAST  TECHNIQUE: Multidetector CT imaging of the abdomen and pelvis was performed using the standard protocol following bolus administration of intravenous contrast.  CONTRAST:  164m OMNIPAQUE IOHEXOL 300 MG/ML  SOLN  COMPARISON:  KUB 10/15/2013 and earlier.  Portable chest radiographs 10/21/2013.  FINDINGS: Mild motion artifact at the lung bases. No pericardial or pleural effusion. Minimal mostly dependent lower lobe opacity. No consolidation.  No acute osseous abnormality identified.  Moderate volume of gas within the bladder (series 2, image 72). Bladder distension with hyperdense fluid. Sagittal images suggest some perivesical fat stranding (series 7 image 67).  Uterus is surgically absent. No definite pelvic free fluid. Oral contrast has reached the proximal rectum. Intermittent gaseous distension and decompression of the sigmoid colon, with no definite sigmoid inflammation. Similar appearance of the distal left colon. Splenic flexure has a more normal appearance. Redundant transverse colon. Negative right colon. Appendix is looped upon itself but appears within normal limits. No dilated small bowel. Mildly distended stomach. Duodenum within normal limits.  Streak artifact through the upper abdomen. Gallbladder surgically absent. Negative liver, spleen, pancreas and adrenal glands. Multiple bilateral intra renal calculi. No obstructive uropathy. Portal venous system within normal limits.  The abdominal aorta is patent with soft plaque distally. 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 The left external iliac artery also likely is thrombosed. There is evidence of reconstituted flow at the left common femoral artery. The right  iliac arteries appear patent with atherosclerosis.  No abdominal free fluid. Some mesenteric haziness is noted in the upper abdomen, nonspecific. No lymphadenopathy identified.  IMPRESSION: 1. Gas within the bladder, and suggestion of some perivesical stranding, most compatible with acute urinary infection unless there has been recent bladder catheterization. 2. Hyperdense fluid within the bladder, with dense bladder contrast noted on 10/15/2013. Suspect today's density is excreted contrast rather than hematuria. Clinical correlation recommended. 3. Nephrolithiasis but no evidence of obstructive uropathy. 4. 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 Flow is reconstituted at the left common femoral artery.   Electronically Signed   By: Lars Pinks M.D.   On: 10/25/2013 02:16    Medications:  Scheduled Meds: . cyanocobalamin  1,000 mcg Intramuscular Daily  . enoxaparin (LOVENOX) injection  30 mg Subcutaneous Q24H  . fentaNYL  50 mcg Transdermal Q72H  . folic acid  1 mg Per Tube Daily  . levETIRAcetam  500 mg Oral BID  . pantoprazole  40 mg Oral QHS  . predniSONE  60 mg Oral Q breakfast  . pregabalin  300 mg Oral BID  . valACYclovir  1,000 mg Oral BID   Continuous Infusions: . dextrose 1,000 mL (10/26/13 1402)   PRN Meds:.docusate sodium, ondansetron (ZOFRAN) IV, oxyCODONE-acetaminophen, polyethylene glycol, sodium chloride  Antibiotics: Anti-infectives   Start     Dose/Rate Route Frequency Ordered Stop   10/24/13 1400  valACYclovir (VALTREX) tablet 1,000 mg     1,000 mg Oral 2 times daily 10/24/13 1255 11/03/13 0959   10/23/13 2200  acyclovir (ZOVIRAX) tablet 400 mg  Status:  Discontinued     400 mg Oral 3 times daily 10/23/13 1918 10/24/13 1253   10/17/13 1100  cefTRIAXone (ROCEPHIN) 1 g in dextrose 5 % 50 mL IVPB     1 g 100 mL/hr over 30 Minutes Intravenous Every 24 hours 10/17/13 1020 10/21/13 1130   10/15/13 1300  oseltamivir (TAMIFLU) 6 MG/ML suspension 75 mg   Status:  Discontinued     75 mg Oral 2 times daily 10/15/13 1252 10/18/13 0932   10/15/13 1245  oseltamivir (TAMIFLU) capsule 75 mg  Status:  Discontinued     75 mg Oral 2 times daily 10/15/13 1227 10/15/13 1252   10/15/13 0730  vancomycin (VANCOCIN) IVPB 750  mg/150 ml premix  Status:  Discontinued     750 mg 150 mL/hr over 60 Minutes Intravenous Every 12 hours 10/14/13 2031 10/17/13 1020   10/15/13 0000  azithromycin (ZITHROMAX) 500 mg in dextrose 5 % 250 mL IVPB  Status:  Discontinued     500 mg 250 mL/hr over 60 Minutes Intravenous Every 24 hours 10/14/13 2351 10/16/13 0847   10/14/13 1930  piperacillin-tazobactam (ZOSYN) IVPB 3.375 g  Status:  Discontinued     3.375 g 12.5 mL/hr over 240 Minutes Intravenous 3 times per day 10/14/13 1855 10/17/13 1020   10/14/13 1900  vancomycin (VANCOCIN) IVPB 1000 mg/200 mL premix     1,000 mg 200 mL/hr over 60 Minutes Intravenous  Once 10/14/13 1855 10/14/13 2030   10/14/13 1845  oseltamivir (TAMIFLU) capsule 75 mg     75 mg Oral  Once 10/14/13 1833 10/14/13 1851     Antibiotics Given (last 72 hours)   Date/Time Action Medication Dose   10/23/13 2351 Given   acyclovir (ZOVIRAX) tablet 400 mg 400 mg   10/24/13 1059 Given   acyclovir (ZOVIRAX) tablet 400 mg 400 mg   10/24/13 1546 Given  [pharmacy delay]   valACYclovir (VALTREX) tablet 1,000 mg 1,000 mg   10/24/13 2152 Given   valACYclovir (VALTREX) tablet 1,000 mg 1,000 mg   10/25/13 1121 Given   valACYclovir (VALTREX) tablet 1,000 mg 1,000 mg   10/25/13 2116 Given   valACYclovir (VALTREX) tablet 1,000 mg 1,000 mg   10/26/13 1000 Given   valACYclovir (VALTREX) tablet 1,000 mg 1,000 mg      Day of Hospitalization:  12 Consults: Treatment Team:  Durward Parcel, MD  Assessment/Plan:   #?Autoimmune Disease - p-ANCA positive; myeloperoxidase 1.2 (H); ESR 98 (but would expect this to be high in acute illness and is non-specific); ANA positive; c3/c4 negative; RF 13 (wnl);  anti-DNA 1 (negative); CRP 23.8 (H); cryoglobulin negative; TSH/T4 low; ANA titer 1:80; p-ANCA titer 1:20 (nl=<1:20).  ACE level wnl.  -consulted Rheumatology will see Monday -further workup as indicated; however, pt has much improved and further workup may be completed as an outpatient  -continue prednisone 36m daily  #Acute Delirium - Resolved.  Pt recent transfer from ICU to IMTS.   Pt is able to carry on a normal conversation with [facility].  We discontinued all of her psychotropic medications and put her back on her regular home meds.  Most likely etiology of acute delirium was combination of medications including precedex.  MRI was not revealing of any acute infarct or mass with normal enhancement.  Patchy subcortical hyperintensities in the left parietal white matter. These are nonspecific and could be seen with chronic microvascular ischemia. They do not appear to be acute and do not  enhance.  -continue PT/OT  -diet dysphagia 3 -monitor  #Severe sepsis secondary to CAP - Resolved.  Pt currently afebrile.  Pt with leukocytosis but is on prednisone 6106m  1/2 BCx did grow gpc in clusters but likely contaminant and pt does not look septic.   -monitor  #Chronic Pain - Pt complains mainly of back pain that is chronic and states it is due to DDD.  CT Abd did not show any acute process.  -continue lyrica 30035mid -continue fentanyl patch q2h PRN -check VitD  #Hypokalemia - Mg/phos wnl.  -give 6 runs of K+ -replete as needed -BMP in AM  #Seizure disorder - No seizures while admitted.  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 -continue PT/OT  #Anemia of chronic disease - Pt also with folate deficiency and probable B12.  Ferritin was high but this was check in acute illness and therefore may be falsely  elevated.  Retic count wnl. Pt reports a FH of anemia and states her mother needed B12 supplementation.  -continue folate supplementation -100027mB12 injection today -MMA pending  #Dispo- Disposition is deferred at this time, awaiting improvement of current medical problems.  Anticipated discharge in approximately 1-2 day(s).    LOS: 12 days   JacMichail JewelsD PGY-1, Internal Medicine  [phone]-5PM Mon-Fri) 10/26/2013, 9:22 PM

## 2013-10-26 NOTE — Discharge Summary (Signed)
Name: [name] [name] MRN: [id number] DOB: [birth year] 53 y.o. PCP: No Pcp Per Patient . Date of Admission: 10/14/2013  5:54 PM Date of Discharge: 10/28/2013 Attending Physician: Dr. Tommy Medal  Discharge Diagnosis: Principal Problem:   Severe sepsis(995.92) Active Problems:   CAP (community acquired pneumonia)   Sepsis   Chronic pain   Hypoxia   Leukocytosis   Seizure disorder   Fibromyalgia   Anemia of chronic disease   Hematuria   History of renal calculi, multiple   Abdominal pain, epigastric   Nausea   Pharyngitis   Acute respiratory failure with hypoxia   Altered mental status  Discharge Medications:   Medication List    STOP taking these medications       DSS 100 MG Caps     Oxcarbazepine 300 MG tablet  Commonly known as:  TRILEPTAL      TAKE these medications       B-12 2000 MCG Tabs  Take 2,000 mcg by mouth daily.     etodolac 500 MG tablet  Commonly known as:  LODINE  Take 500 mg by mouth 2 (two) times daily.     fentaNYL 50 MCG/HR  Commonly known as:  DURAGESIC - dosed mcg/hr  Place 1 patch (50 mcg total) onto the skin every 3 (three) days.     folic acid 1 MG tablet  Commonly known as:  FOLVITE  Take 1 tablet daily.     levETIRAcetam 500 MG tablet  Commonly known as:  KEPPRA  Take 1 tablet (500 mg total) by mouth 2 (two) times daily.     pantoprazole 40 MG tablet  Commonly known as:  PROTONIX  Take 40 mg by mouth 2 (two) times daily.     polyethylene glycol packet  Commonly known as:  MIRALAX / GLYCOLAX  Take 17 g by mouth daily as needed for mild constipation or moderate constipation.     potassium chloride 10 MEQ tablet  Commonly known as:  K-DUR  Take 1 tablet (10 mEq total) by mouth daily.     predniSONE 10 MG tablet  Commonly known as:  DELTASONE  - Take 40 mg daily with breakfast for 3 days (10/29/2013 - 10/31/2013), then   - Take 30 mg daily with breakfast for 3 days (2/14 -2/16), then   - Take 20 mg daily with breakfast for 3  days (2/17-2/19) Then   - Take 10 mg daily with breakfast for 3 days (2/10-2/20) and stop.     pregabalin 300 MG capsule  Commonly known as:  LYRICA  Take 1 capsule (300 mg total) by mouth 2 (two) times daily.     rizatriptan 10 MG tablet  Commonly known as:  MAXALT  Take 10 mg by mouth daily as needed for migraine. May repeat in 2 hours if needed     traMADol 50 MG tablet  Commonly known as:  ULTRAM  Take 1 tablet (50 mg total) by mouth every 12 (twelve) hours as needed for moderate pain.     valACYclovir 1000 MG tablet  Commonly known as:  VALTREX  Take 1 tablet (1,000 mg total) by mouth 2 (two) times daily.     Vitamin D (Ergocalciferol) 50000 UNITS Caps capsule  Commonly known as:  DRISDOL  Take 1 capsule (50,000 Units total) by mouth every 7 (seven) days.        Disposition and follow-up:   [name] [name] was discharged from [facility] in Stable condition.  Please address  the following problems:  1.  Further workup for possible autoimmune disease and health maintenance. Pt is a pleasant lady from [place] who recently moved to [place] to live with her mother. She has not had a mammogram or colonoscopy and will need these.  She is also at risk for osteoporosis given her h/o steroid use and will need a dexa scan.  She is also deficient in Vit D and will need to make sure she is completing the course for treatment.  Please make sure she is also compliant with her steroid taper.  Additionally, she has no health insurance and will need to see Bonna Gains for financial assistance.  She is scheduled to follow-up with pulmonology and rheumatology as an outpatient for further work-up.  2.  Labs / imaging needed at time of follow-up: BMP (pt had hypokalemia during hospitalization)  3.  Pending labs/ test needing follow-up: None   Follow-up Appointments: Follow-up Information   Follow up with [facility], MD On 11/07/2013. (2:30 PM)    Specialty:   Pulmonary Disease   Contact information:   [place] [address] [phone]       Follow up with Ivin Poot, MD On 11/04/2013. (8:30AM)    Specialty:  Internal Medicine   Contact information:   [place] [address] [phone]       Discharge Instructions: Discharge Orders   Future Appointments Provider Department Dept Phone   10/30/2013 10:00 AM [facility] Provider [place] [phone]   11/04/2013 8:30 AM Ivin Poot, MD [place] [phone]   11/07/2013 2:30 PM Brand Males, MD [facility] [phone]   Future Orders Complete By Expires   Call MD for:  extreme fatigue  As directed    Call MD for:  persistant dizziness or light-headedness  As directed    Diet - low sodium heart healthy  As directed    Increase activity slowly  As directed       Consultations: Treatment Team:  Durward Parcel, MD  Procedures Performed:  Dg Neck Soft Tissue  10/08/2013   CLINICAL DATA:  Dysphagia.  EXAM: NECK SOFT TISSUES - 1+ VIEW  COMPARISON:  None.  FINDINGS: There is no evidence of retropharyngeal soft tissue swelling or epiglottic enlargement. The cervical airway is unremarkable and no radio-opaque foreign body identified. Tongue base is normal. Minimal anterior osteophytes in the lower cervical spine. No disc space narrowing.  IMPRESSION: No significant abnormality.   Electronically Signed   By: Rozetta Nunnery M.D.   On: 10/08/2013 17:40   Dg Chest 2 View  10/05/2013   CLINICAL DATA:  History of chest pain for 48 hr. And arthritis and fibroma myalgia  EXAM: CHEST  2 VIEW  COMPARISON:  None.  FINDINGS: The lungs are well-expanded. There are fluffy increased lung markings bilaterally but most conspicuously in the right upper lobe. The cardiopericardial silhouette is normal in size. The pulmonary vascularity is not engorged. There is no pleural effusion or  pneumothorax. The mediastinum is normal in width. The observed portions of the bony thorax exhibit no acute abnormalities.  IMPRESSION: Increased interstitial and alveolar densities bilaterally but predominantly on the right are consistent with pneumonia. Certainly other alveolar filling process ease could be a present. Follow-up chest films or chest CT scanning following therapy will be needed to assure clearing.   Electronically Signed   By: David  Martinique   On: 10/05/2013 16:33  Ct Angio Chest Pe W/cm &/or Wo Cm  10/15/2013   CLINICAL DATA:  Syncope, fever, shortness of breath  EXAM: CT ANGIOGRAPHY CHEST WITH CONTRAST  TECHNIQUE: Multidetector CT imaging of the chest was performed using the standard protocol during bolus administration of intravenous contrast. Multiplanar CT image reconstructions including MIPs were obtained to evaluate the vascular anatomy.  CONTRAST:  147m OMNIPAQUE IOHEXOL 350 MG/ML SOLN  COMPARISON:  10/14/2013, 10/05/2013  FINDINGS: Visualized pulmonary arteries are patent. No significant filling defect or pulmonary embolus demonstrated by CTA. Intact thoracic aorta and major branch vessels. No dissection or aneurysm. Normal heart size. No pericardial or pleural effusion. Diffuse mediastinal and bilateral hilar adenopathy. Index left prevascular lymph noted measures 3.6 x 2.5 cm, image 49. Superior right peritracheal lymph node measures 3.0 x 1.7 cm, image 30 fine. Enlarged subcarinal and bilateral hilar adenopathy also present.  Included upper abdomen demonstrates punctate nonobstructing intrarenal calculi in the upper poles. No other acute finding.  Lung windows demonstrate mixed airspace and ground-glass nodular opacities extensively throughout the upper lobes, right middle lobe, lingula, and the superior segments of the lower lobes. Infectious/inflammatory process is favored such as multi lobar pneumonia. Other less likely considerations include acute alveolar hemorrhage or alveolar  edema.  No acute osseous finding.  Review of the MIP images confirms the above findings.  IMPRESSION: No significant acute pulmonary embolus by CTA.  Bilateral prominently upper lobe mixed airspace and ground-glass opacities, multi lobar pneumonia is favored.  Mediastinal and bilateral hilar adenopathy. Recommend short-term followup after treatment of the acute episode to document resolution and to exclude a lymphoproliferative process   Electronically Signed   By: TDaryll BrodM.D.   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 Question sarcoidosis. Bacteremia.  EXAM: MRI HEAD WITHOUT AND WITH CONTRAST  TECHNIQUE: Multiplanar, multiecho pulse sequences of the brain and surrounding structures were obtained without and with intravenous contrast.  CONTRAST:  176mMULTIHANCE GADOBENATE DIMEGLUMINE 529 MG/ML IV SOLN  COMPARISON:  CT head 10/15/2013  FINDINGS: Image quality degraded by mild motion.  Subcortical hyperintensities in the left parietal white matter are nonspecific. These are non masslike and do not enhance. Remaining white matter is normal. Brainstem and cerebellum are normal.  Negative for acute infarct.  Negative for hemorrhage or mass lesion.  No edema in the brain.  Postcontrast imaging reveals normal enhancement. No enhancing mass or leptomeningeal thickening is identified. No evidence of neurosarcoidosis.  IMPRESSION: Patchy subcortical hyperintensities in the left parietal white matter. These are nonspecific and could be seen with chronic microvascular ischemia. They do not appear to be acute and do not enhance.  No acute infarct or mass.  Normal enhancement.   Electronically Signed   By: ChFranchot Gallo.D.   On: 10/24/2013 20:59   UsKoreabdomen Complete  10/15/2013   CLINICAL DATA:  Epigastric pain, evaluate for pancreatitis.  EXAM: ULTRASOUND ABDOMEN COMPLETE  COMPARISON:  None.  FINDINGS: Gallbladder:  Surgically absent.  Common  bile duct:  Diameter: 7 mm, which is upper normal to mildly prominent. The distal duct near the ampulla is obscured by bowel gas artifact.  Liver:  No focal lesion identified. Within normal limits in parenchymal echogenicity. There may be a slight intrahepatic biliary ductal prominence.  IVC:  No abnormality visualized.  Pancreas:  Visualized portion unremarkable.  Spleen:  Measures 8 cm in oblique diameter. Nonspecific appearance to the hilum.  Right Kidney:  Length: 10.2 cm. Linear echogenic foci measuring up to 8 mm, favored to reflect stones. No hydronephrosis.  Left Kidney:  Length: 10.6 cm pre. Linear echogenic foci measuring up to 6 mm, favored to reflect stones. No hydronephrosis.  Abdominal aorta:  No aneurysm visualized.  Other findings:  None.  IMPRESSION: Mild intra and extrahepatic biliary ductal prominence is nonspecific status post cholecystectomy. Correlate with LFTs and ERCP if warranted.  No appreciable abnormality of the pancreas however note that ultrasound has limited sensitivity in evaluating for pancreatitis and associated complications.  Nonspecific appearance to the spleen.  Recommend CT.  Bilateral renal stones.  No hydronephrosis.   Electronically Signed   By: Carlos Levering M.D.   On: 10/15/2013 01:02   Ct Abdomen Pelvis W Contrast  10/25/2013   CLINICAL DATA:  53 year old female with but identified source of infection. Vomiting. Left lower quadrant pain. Initial encounter.  EXAM: CT ABDOMEN AND PELVIS WITH CONTRAST  TECHNIQUE: Multidetector CT imaging of the abdomen and pelvis was performed using the standard protocol following bolus administration of intravenous contrast.  CONTRAST:  176m OMNIPAQUE IOHEXOL 300 MG/ML  SOLN  COMPARISON:  KUB 10/15/2013 and earlier. Portable chest radiographs 10/21/2013.  FINDINGS: Mild motion artifact at the lung bases. No pericardial or pleural effusion. Minimal mostly dependent lower lobe opacity. No consolidation.  No acute osseous abnormality  identified.  Moderate volume of gas within the bladder (series 2, image 72). Bladder distension with hyperdense fluid. Sagittal images suggest some perivesical fat stranding (series 7 image 67).  Uterus is surgically absent. No definite pelvic free fluid. Oral contrast has reached the proximal rectum. Intermittent gaseous distension and decompression of the sigmoid colon, with no definite sigmoid inflammation. Similar appearance of the distal left colon. Splenic flexure has a more normal appearance. Redundant transverse colon. Negative right colon. Appendix is looped upon itself but appears within normal limits. No dilated small bowel. Mildly distended stomach. Duodenum within normal limits.  Streak artifact through the upper abdomen. Gallbladder surgically absent. Negative liver, spleen, pancreas and adrenal glands. Multiple bilateral intra renal calculi. No obstructive uropathy. Portal venous system within normal limits.  The abdominal aorta is patent with soft plaque distally. 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 The left external iliac artery also likely is thrombosed. There is evidence of reconstituted flow at the left common femoral artery. The right iliac arteries appear patent with atherosclerosis.  No abdominal free fluid. Some mesenteric haziness is noted in the upper abdomen, nonspecific. No lymphadenopathy identified.  IMPRESSION: 1. Gas within the bladder, and suggestion of some perivesical stranding, most compatible with acute urinary infection unless there has been recent bladder catheterization. 2. Hyperdense fluid within the bladder, with dense bladder contrast noted on 10/15/2013. Suspect today's density is excreted contrast rather than hematuria. Clinical correlation recommended. 3. Nephrolithiasis but no evidence of obstructive uropathy. 4. 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 Flow is reconstituted at the left common femoral artery.   Electronically Signed   By:  LLars PinksM.D.   On: 10/25/2013 02:16   Dg Chest Port 1 View  10/21/2013   CLINICAL DATA:  Cough and shortness of breath.  EXAM: PORTABLE CHEST - 1 VIEW  COMPARISON:  DG CHEST 1V PORT dated 10/20/2013; DG CHEST 1V PORT dated 10/19/2013; CT ANGIO CHEST W/CM &/OR WO/CM dated 10/15/2013  FINDINGS: Underlying mild hyperinflation. Midline trachea. Normal heart size. No pleural effusion or pneumothorax. Improved to resolved interstitial edema. Mild asymmetric pulmonary venous congestion  suspected, primarily on the left. Patchy left-sided airspace disease is also slightly improved. Most apparent at the left lung base.  IMPRESSION: Improved interstitial and airspace disease. Primarily felt to represent pulmonary edema. Improving infection could look similar.   Electronically Signed   By: Abigail Miyamoto M.D.   On: 10/21/2013 07:08   Dg Chest Port 1 View  10/20/2013   CLINICAL DATA:  Shortness of breath after extubation  EXAM: PORTABLE CHEST - 1 VIEW  COMPARISON:  10/19/2013  FINDINGS: Interval removal of support apparatus. Interval increase in diffuse interstitial opacification. Background biapical infiltrates, assessed by previous CT imaging. Stable heart size and mediastinal contours. No pleural effusion or pneumothorax.  IMPRESSION: 1. Interval development of pulmonary edema. 2. Background biapical infiltrates, pneumonia based on CT 10/16/2013.   Electronically Signed   By: Jorje Guild M.D.   On: 10/20/2013 06:08   Dg Chest Port 1 View  10/19/2013   CLINICAL DATA:  Ventilated patient.  EXAM: PORTABLE CHEST - 1 VIEW  COMPARISON:  10/17/2013  FINDINGS: Cardiac silhouette is normal in size.  Normal mediastinal contours.  Patchy areas of predominantly upper lobe airspace opacity have mildly improved. No new lung opacities. No pleural effusion or pneumothorax.  Endotracheal tube, left internal jugular central venous line and nasogastric tube are stable well-positioned.  IMPRESSION: 1. Improved lung infiltrates. 2. Support  apparatus is stable in well positioned.   Electronically Signed   By: Lajean Manes M.D.   On: 10/19/2013 08:13   Dg Chest Port 1 View  10/17/2013   CLINICAL DATA:  Evaluate endotracheal tube.  EXAM: PORTABLE CHEST - 1 VIEW  COMPARISON:  DG CHEST 1V PORT dated 10/16/2013; CT ANGIO CHEST W/CM &/OR WO/CM dated 10/15/2013  FINDINGS: Endotracheal tube 3 cm from the carina. Left IJ central line remains present. Enteric tube is present with the redundant coital in the stomach. Improving bilateral upper lobe predominant airspace disease, now roughly symmetric. No effusion is identified. Monitoring leads project over the chest.  IMPRESSION: 1. Endotracheal tube 3 cm from the carina. Other support apparatus appear similar. 2. Improving bilateral upper lobe airspace disease, and now roughly symmetric.   Electronically Signed   By: Dereck Ligas M.D.   On: 10/17/2013 07:16   Dg Chest Port 1 View  10/16/2013   CLINICAL DATA:  Bilateral pneumonia.  EXAM: PORTABLE CHEST - 1 VIEW  COMPARISON:  10/15/2013 and 10/14/2013  FINDINGS: Endotracheal tube and central line and the NG tube appear in good position. Infiltrates in the right lung appears slightly improved. There is slight increased infiltrate in the left upper lobe.  Heart size and vascularity are normal.  No effusions.  IMPRESSION: Improving pneumonia on the right. Slight increased consolidation on the left.   Electronically Signed   By: Rozetta Nunnery M.D.   On: 10/16/2013 07:59   Dg Chest Port 1 View  10/15/2013   CLINICAL DATA:  Central line placement  EXAM: PORTABLE CHEST - 1 VIEW  COMPARISON:  10/15/2013  FINDINGS: The endotracheal tube tip projects E 0.1 cm proximal to the carina. Left IJ central venous catheter tip projects over the distal SVC. No appreciable pneumothorax. Multiple other wires and tubes are presumed external. Perihilar/upper lobe predominant airspace opacities persist.  IMPRESSION: Interval left IJ catheter placement with tip projecting over the  distal SVC. No pneumothorax.  Perihilar/ upper lobe predominant airspace and interstitial opacities persist.   Electronically Signed   By: Carlos Levering M.D.   On: 10/15/2013 04:56  Dg Chest Port 1 View  10/15/2013   CLINICAL DATA:  Assess endotracheal tube  EXAM: PORTABLE CHEST - 1 VIEW  COMPARISON:  10/15/2013 CT  FINDINGS: Endotracheal tube tip 2.2 cm proximal to the carina. Bilateral upper lobe and perihilar interstitial and airspace opacities, increased. Hilar and paratracheal adenopathy. No pneumothorax or interval osseous change.  IMPRESSION: Endotracheal tube tip 2.2 cm proximal to the carina.  Worsening bilateral interstitial and airspace opacities.  Hilar and paratracheal adenopathy.   Electronically Signed   By: Carlos Levering M.D.   On: 10/15/2013 04:15   Dg Chest Port 1 View  10/14/2013   CLINICAL DATA:  Pneumonia  EXAM: PORTABLE CHEST - 1 VIEW  COMPARISON:  10/05/2013  FINDINGS: Worsening poorly marginated airspace opacities in the left upper lobe. Some improvement in the alveolar and airspace opacities in the right mid and upper lung. Heart size normal. Effacement of the aorticopulmonary window suggesting adenopathy. . No effusion. Visualized skeletal structures are unremarkable.  IMPRESSION: 1. Improving right and slightly increasing left airspace disease. 2. Possible AP window adenopathy.   Electronically Signed   By: Arne Cleveland M.D.   On: 10/14/2013 18:30   Dg Abd Portable 1v  10/15/2013   CLINICAL DATA:  Orogastric tube placement  EXAM: PORTABLE ABDOMEN - 1 VIEW  COMPARISON:  Portable exam 0522 hr without priors for comparison.  FINDINGS: Orogastric tube is coiled in stomach with tip projecting over approximate position of pylorus.  Nonobstructive bowel gas pattern.  No bowel dilatation or bowel wall thickening.  Excreted contrast within mildly distended urinary bladder and within renal collecting systems.  Osseous structures unremarkable.  Lung bases clear.  IMPRESSION:  Orogastric tube coiled in stomach.   Electronically Signed   By: Lavonia Dana M.D.   On: 10/15/2013 08:05   Ct Portable Head W/o Cm  10/15/2013   CLINICAL DATA:  Altered mental status.  Seizure.  EXAM: CT HEAD WITHOUT CONTRAST  TECHNIQUE: Contiguous axial images were obtained from the base of the skull through the vertex without intravenous contrast.  COMPARISON:  None.  FINDINGS: The patient is intubated.  An NG tube is in place.  No acute cortical infarct, hemorrhage, or mass lesion is present. The ventricles are of normal size. No significant extra-axial fluid collection is present. The paranasal sinuses and mastoid air cells are clear. The osseous skull is intact.  IMPRESSION: Negative portable CT of the head.   Electronically Signed   By: Lawrence Santiago M.D.   On: 10/15/2013 18:08    2D Echo: N/A  Cardiac Cath: N/A  Admission HPI: [Pt was admitted by [facility] service and was transferred to IMTS after extubation. Below is their HPI.]  53 yo female with recent admission for CAP re-presents with fever, leukocytosis, chest pain, and hypoxia in setting of possible seizure vs pre-syncope.   Hospital Course by problem list: Principal Problem:   Severe sepsis(995.92) Active Problems:   CAP (community acquired pneumonia)   Sepsis   Chronic pain   Hypoxia   Leukocytosis   Seizure disorder   Fibromyalgia   Anemia of chronic disease   Hematuria   History of renal calculi, multiple   Abdominal pain, epigastric   Nausea   Pharyngitis   Acute respiratory failure with hypoxia   Altered mental status   [name] [name] is a 53 yo female with a history of fibromyalgia and ?seizure disorder with a recent admission for CAP who re-presented to the ED with fever, leukocytosis, chest pain, and hypoxia  in setting of possible seizure vs pre-syncope. She completed a course of levaquin after her prior admission, but states her fever returned and was so high that it made her "pass out." She was then brought to ED  via EMS and found to have temp 103F, pulse ~140 bpm, with bp 94/56 with leukocytosis >40K and CXR demonstrating improved right mid and upper lung opacities with worsening left upper lobe airspace disease. During monitoring in ED pt became hypoxic with O2 83-85% on nasal canula requiring BiPAP.  #Community acquired pneumonia complicated by septic shock: Upon admission, patient was found to have temp 103 with leukocytosis >40K and worsening bilateral interstitial and airspace opacities with some hilar and paratracheal adenopathy. Her O2 sats dropped to 80% in the ED and she was placed on BiPAP with no improvement in her respiratory status. She was subsequently intubated and transferred to the ICU. CTA was negative for PE. Bronchoscopy revealed no purulence in her airways, no DAH, no HSV lesions, and no mass lesions. Bronchoalveolar lavage was negative for pneumocystis, AFB, fungus, and group A strep, with negative respiratory virus panel. Rapid flu and HIV were negative and blood cultures showed no growth. She was treated with azithromycin from 1/27-1/29, vancomycin and zosyn from 1/27-1/30, ceftriaxone from 1/30-2/2, and tamiflu from 1/28-1/31. Due to concurrent hematuria, there was concern for vasculitis and she was also started on solumedrol 45m q6hrs. On 1/30, she developed hypotension and bradycardia requiring pressors for 24hrs. By 2/1, her respiratory status improved and she was able to be extubated. She was transferred to the floor on 2/4 and her O2 saturations remained >93% without any oxygen. She does admit being on intermittent O2 in the past but does not know why and there is no mention of O2 use in any of her records.   #Acute delirium: Patient developed acute delerium following extubation on 2/1 and refused to speak or eat. She was started on a precedex drip and haldol with little benefit. Her delerium was suspected to be a result of steroid psychosis vs. ICU delerium. On 2/3, she was seen by  psychiatry and started on risperdal, ativan, and cogentin while precedex and haldol were discontinued. EEG on 2/4 showed nonspecific slowing of cerebral activity, most consistent with metabolic, toxic, or degenerative encephalopathy. Brain MRI showed no acute infarct or mass. On 2/5, her delirium was markedly improved and she was able to follow commands but still refused to speak. Her psychoactive medications were discontinued and she was found to be fully alert and oriented the next day and was conversing normally. She did not experience any further delirium for the rest of her hospital stay.   #?Autoimmune disease: Patient was started on solumedrol [id number] on 1/29 due to respiratory distress and hematuria with concern for vasculitis. She was found to be ANA positive with titer of 1:80 and mildly p-ANCA positive with titer of 1:20. Myeloperoxidase was 1.2 (H); ESR 98, and CRP 23.8 RF, anti-DNA 1, cryoglobulin, ACE, c3/c4 were all within normal limits. TSH/T4 were low, likely due to her acute illness. 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 Vasculitis is less likely due to only mildly elevated p-ANCA, likely clinically insignificant. Patient will follow up in the rheumatology clinic for further outpatient workup. Patient should also follow up with pulmonology for  repeat chest CT to assess resolution of hilar adenopathy and consider lung biopsy to confirm sarcoidosis. She will continue on prednisone until seen in clinic by internal medicine and rheumatology.   #Hematuria: Patient complained of hematuria on admission and has a history of nephrolithiasis. Abdominal ultrasound on day of admission revealed bilateral nephrolithias with an 28m stone on the left and 67m stone on the right with no evidence of obstructive uropathy. Her hematuria resolved 2 days later. On 2/7, she again developed hematuria in addition to urinary retention requiring in and out catheterization. She also complained of sharp pain with urination similar to prior episodes of passing stones. She was started on flomax on 2/9 and she was urinating normally by the next morning with no hematuria.   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 She denied any history of genital herpes and stated that she is currently not sexually active. She was started on acyclovir 40032mID on 2/5 and switched to valacyclovir 1000m24mD due to concern for HSV encephalitis. HSV 1 & 2 IgG were positive.   #?Seizure disorder: Patient has reported history of seizure disorder as a teenager, however neurology records provided by the patient mention prior migraine headaches but no seizures. Patient has not taken an anti-seizure medication in years. There was some question of whether she had a seizure immediately prior to admission and had no evidence of post-ictal state. CT head on admission was egative for acute process. EEG on 1/29 showed no evidence of seizure activity. She was started on keppra on 2/2 which she continued until discharge with no evidence of seizure activity.   #Fibromyalgia and Chronic Pain Syndrome: Patient has a history of fibromyalgia and complained of diffuse body aches on admission. She was continued on her home regimen of lyrica 300mg38m and fentanyl patch. CT Abd was performed due to abdominal pain and did not reveal any acute processes. Lipase/amylase, AST/ALT/Bili were all within normal limits. She was continued on her home regiment of BID Protonix due to history of GERD.   #Normocytic Anemia: Patient was found to have Vit B12 and folate deficiencies in addition to high ferretin indicating anemia of chronic disease. She was provided B12  and folate supplementation.   #Deconditioning - Patient complained of LE weakness and numbness likely secondary to known lumbar radiculopathy. She was seen by PT, who recommended continued home health PT. She did have one fall while in the hospital and claimed to have slipped on the floor, but sustained no injuries.   #Hypocalcemia: Patient was found to have low calcium with low vitamin D. She was prescribed vitamin D at the time of discharge.   #Insomnia - Patient has chronic insomnia at home, which was likely worsened by steroid use in the hospital. She was given ramelteon 8mg a37medtime for 1 night prior to discharge.    Discharge Vitals:   BP 93/60  Pulse 62  Temp(Src) 98.3 F (36.8 C) (Oral)  Resp 18  Ht '5\' 1"'  (1.549 m)  Wt 127 lb 13.9 oz (58 kg)  BMI 24.17 kg/m2  SpO2 99%  Discharge Labs:  Results for orders placed during the hospital encounter of 10/14/13 (from the past 24 hour(s))  CBC     Status: Abnormal   Collection Time    10/28/13  5:46 AM      Result Value Range   WBC 9.4  4.0 - 10.5 K/uL   RBC 3.45 (*)  3.87 - 5.11 MIL/uL   Hemoglobin 9.6 (*) 12.0 - 15.0 g/dL   HCT 31.3 (*) 36.0 - 46.0 %   MCV 90.7  78.0 - 100.0 fL   MCH 27.8  26.0 - 34.0 pg   MCHC 30.7  30.0 - 36.0 g/dL   RDW 17.1 (*) 11.5 - 15.5 %   Platelets 367  150 - 400 K/uL  BASIC METABOLIC PANEL     Status: Abnormal   Collection Time    10/28/13  5:46 AM      Result Value Range   Sodium 145  137 - 147 mEq/L   Potassium 3.5 (*) 3.7 - 5.3 mEq/L   Chloride 108  96 - 112 mEq/L   CO2 24  19 - 32 mEq/L   Glucose, Bld 92  70 - 99 mg/dL   BUN 8  6 - 23 mg/dL   Creatinine, Ser 0.80  0.50 - 1.10 mg/dL   Calcium 7.9 (*) 8.4 - 10.5 mg/dL   GFR calc non Af Amer 84 (*) >90 mL/min   GFR calc Af Amer >90  >90 mL/min    Signed: Michail Jewels, MD [phone] 10/28/2013, 8:49 PM   Time Spent on Discharge: 21mnutes Services Ordered on Discharge: None Equipment Ordered on Discharge: None

## 2013-10-26 NOTE — Progress Notes (Signed)
10/26/13 0837  Urine Characteristics  Intermittent/Straight Cath (mL) 525 mL  Madelin RearLonnie Dezire Turk, MSN, RN, [facility] IncCMSRN

## 2013-10-26 NOTE — Progress Notes (Signed)
Medical Student Daily Progress Note  Subjective: Overnight, Ms. Maya slipped and fell in her room, bruising her knee, but did not hit her head. She attributes this to the socks she was wearing and denies dizziness or palpitations at the time of the fall. She also complains of almost black appearing loose stools 1 time yesterday. She continues to have heartburn that is not relieved with a PPI. She had some trouble urinating yesterday, requiring in and out catheterization which revealed some blood in the bladder. She endorses some L lower back pain. She also complains of some short and long term memory loss that she has noted for a while now. Also admitted that her SOB occassionally requires home use of O2. States she had ?stage 4 cancer or ?uterine fibroids; underwent hysterectomy.   Objective: Vital signs in last 24 hours: Filed Vitals:   10/26/13 0040 10/26/13 0242 10/26/13 0459 10/26/13 0822  BP: 103/72 109/65 103/66   Pulse: 68 58 55   Temp: 98.4 F (36.9 C) 98.4 F (36.9 C) 98.2 F (36.8 C)   TempSrc: Oral Oral Oral   Resp: '20 20 20   ' Height:      Weight:    58.7 kg (129 lb 6.6 oz)  SpO2: 93% 93% 93%    Weight change:   Intake/Output Summary (Last 24 hours) at 10/26/13 1012 Last data filed at 10/26/13 1660  Gross per 24 hour  Intake    920 ml  Output    675 ml  Net    245 ml   Physical Exam: Constitutional: Alert, in no acute distress and cooperative with exam.  Eyes: PERRLA, conjunctivae normal, no scleral icterus  Neck: Supple, Trachea midline  Cardiovascular: RRR, S1, S2 present, no MRG, DP 2+ b/l,  Pulmonary/Chest: normal respiratory effort, CTAB, no wheezes, rales, or rhonchi  Abdominal: Soft. +BS, LLQ tenderness; non-distended  Neurological: Alert and responsive and able to follow commands; cranial nerves II-XII are grossly intact, moving all extremities  GU: bilateral labial lesions with open sores  Skin: Warm, dry and intact.  Psychiatric: Normal mood and affect.    Micro Results: Recent Results (from the past 240 hour(s))  CLOSTRIDIUM DIFFICILE BY PCR     Status: None   Collection Time    10/22/13 12:03 PM      Result Value Range Status   C difficile by pcr NEGATIVE  NEGATIVE Final  URINE CULTURE     Status: None   Collection Time    10/22/13  9:15 PM      Result Value Range Status   Specimen Description URINE, CATHETERIZED   Final   Special Requests NONE   Final   Culture  Setup Time     Final   Value: 10/22/2013 22:16     Performed at [place]     Final   Value: NO GROWTH     Performed at [facility]   Culture     Final   Value: NO GROWTH     Performed at [facility]   Report Status 10/23/2013 FINAL   Final  CULTURE, BLOOD (ROUTINE X 2)     Status: None   Collection Time    10/22/13  9:51 PM      Result Value Range Status   Specimen Description BLOOD LEFT ARM   Final   Special Requests BOTTLES DRAWN AEROBIC AND ANAEROBIC 10CC   Final   Culture  Setup Time  Final   Value: 10/23/2013 00:26     Performed at [facility]   Culture     Final   Value: STAPHYLOCOCCUS SPECIES (COAGULASE NEGATIVE)     Note: THE SIGNIFICANCE OF ISOLATING THIS ORGANISM FROM A SINGLE SET OF BLOOD CULTURES WHEN MULTIPLE SETS ARE DRAWN IS UNCERTAIN. PLEASE NOTIFY THE MICROBIOLOGY DEPARTMENT WITHIN ONE WEEK IF SPECIATION AND SENSITIVITIES ARE REQUIRED.     Note: Gram Stain Report Called to,Read Back By and Verified With: TINA SAVAGE ON 10/23/2013 AT 10:31P BY WILEJ     Performed at [facility]   Report Status 10/25/2013 FINAL   Final  CULTURE, BLOOD (ROUTINE X 2)     Status: None   Collection Time    10/22/13 11:10 PM      Result Value Range Status   Specimen Description BLOOD LEFT FINGER   Final   Special Requests BOTTLES DRAWN AEROBIC ONLY 2CC FROM LEFT THUMB   Final   Culture  Setup Time     Final   Value: 10/23/2013 08:45     Performed at [facility]   Culture     Final   Value:         BLOOD CULTURE RECEIVED NO GROWTH TO DATE CULTURE WILL BE HELD FOR 5 DAYS BEFORE ISSUING A FINAL NEGATIVE REPORT     Performed at [facility]   Report Status PENDING   Incomplete  WET PREP, GENITAL     Status: Abnormal   Collection Time    10/23/13  7:16 PM      Result Value Range Status   Yeast Wet Prep HPF POC NONE SEEN  NONE SEEN Final   Trich, Wet Prep NONE SEEN  NONE SEEN Final   Clue Cells Wet Prep HPF POC NONE SEEN  NONE SEEN Final   WBC, Wet Prep HPF POC MODERATE (*) NONE SEEN Final  CULTURE, BLOOD (ROUTINE X 2)     Status: None   Collection Time    10/24/13  2:40 AM      Result Value Range Status   Specimen Description BLOOD RIGHT ARM   Final   Special Requests BOTTLES DRAWN AEROBIC ONLY 10CC   Final   Culture  Setup Time     Final   Value: 10/24/2013 08:30     Performed at [facility]   Culture     Final   Value:        BLOOD CULTURE RECEIVED NO GROWTH TO DATE CULTURE WILL BE HELD FOR 5 DAYS BEFORE ISSUING A FINAL NEGATIVE REPORT     Performed at [facility]   Report Status PENDING   Incomplete  CULTURE, BLOOD (ROUTINE X 2)     Status: None   Collection Time    10/24/13  2:50 AM      Result Value Range Status   Specimen Description BLOOD RIGHT HAND   Final   Special Requests     Final   Value: BOTTLES DRAWN AEROBIC AND ANAEROBIC 10CC BLUE,7CC RED   Culture  Setup Time     Final   Value: 10/24/2013 08:30     Performed at [facility]   Culture     Final   Value:        BLOOD CULTURE RECEIVED NO GROWTH TO DATE CULTURE WILL BE HELD FOR 5 DAYS BEFORE ISSUING A FINAL NEGATIVE REPORT     Performed at [facility]   Report Status PENDING  Incomplete   Studies/Results: Mr Kizzie Fantasia Contrast  10/24/2013   CLINICAL DATA:  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 Question sarcoidosis. Bacteremia.  EXAM: MRI HEAD WITHOUT AND WITH CONTRAST  TECHNIQUE: Multiplanar, multiecho pulse sequences of the brain and surrounding structures were  obtained without and with intravenous contrast.  CONTRAST:  3m MULTIHANCE GADOBENATE DIMEGLUMINE 529 MG/ML IV SOLN  COMPARISON:  CT head 10/15/2013  FINDINGS: Image quality degraded by mild motion.  Subcortical hyperintensities in the left parietal white matter are nonspecific. These are non masslike and do not enhance. Remaining white matter is normal. Brainstem and cerebellum are normal.  Negative for acute infarct.  Negative for hemorrhage or mass lesion.  No edema in the brain.  Postcontrast imaging reveals normal enhancement. No enhancing mass or leptomeningeal thickening is identified. No evidence of neurosarcoidosis.  IMPRESSION: Patchy subcortical hyperintensities in the left parietal white matter. These are nonspecific and could be seen with chronic microvascular ischemia. They do not appear to be acute and do not enhance.  No acute infarct or mass.  Normal enhancement.   Electronically Signed   By: CFranchot GalloM.D.   On: 10/24/2013 20:59   Ct Abdomen Pelvis W Contrast  10/25/2013   CLINICAL DATA:  53year old female with but identified source of infection. Vomiting. Left lower quadrant pain. Initial encounter.  EXAM: CT ABDOMEN AND PELVIS WITH CONTRAST  TECHNIQUE: Multidetector CT imaging of the abdomen and pelvis was performed using the standard protocol following bolus administration of intravenous contrast.  CONTRAST:  1025mOMNIPAQUE IOHEXOL 300 MG/ML  SOLN  COMPARISON:  KUB 10/15/2013 and earlier. Portable chest radiographs 10/21/2013.  FINDINGS: Mild motion artifact at the lung bases. No pericardial or pleural effusion. Minimal mostly dependent lower lobe opacity. No consolidation.  No acute osseous abnormality identified.  Moderate volume of gas within the bladder (series 2, image 72). Bladder distension with hyperdense fluid. Sagittal images suggest some perivesical fat stranding (series 7 image 67).  Uterus is surgically absent. No definite pelvic free fluid. Oral contrast has reached the  proximal rectum. Intermittent gaseous distension and decompression of the sigmoid colon, with no definite sigmoid inflammation. Similar appearance of the distal left colon. Splenic flexure has a more normal appearance. Redundant transverse colon. Negative right colon. Appendix is looped upon itself but appears within normal limits. No dilated small bowel. Mildly distended stomach. Duodenum within normal limits.  Streak artifact through the upper abdomen. Gallbladder surgically absent. Negative liver, spleen, pancreas and adrenal glands. Multiple bilateral intra renal calculi. No obstructive uropathy. Portal venous system within normal limits.  The abdominal aorta is patent with soft plaque distally. 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 The left external iliac artery also likely is thrombosed. There is evidence of reconstituted flow at the left common femoral artery. The right iliac arteries appear patent with atherosclerosis.  No abdominal free fluid. Some mesenteric haziness is noted in the upper abdomen, nonspecific. No lymphadenopathy identified.  IMPRESSION: 1. Gas within the bladder, and suggestion of some perivesical stranding, most compatible with acute urinary infection unless there has been recent bladder catheterization. 2. Hyperdense fluid within the bladder, with dense bladder contrast noted on 10/15/2013. Suspect today's density is excreted contrast rather than hematuria. Clinical correlation recommended. 3. Nephrolithiasis but no evidence of obstructive uropathy. 4. 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 Flow is reconstituted at the left common femoral artery.   Electronically Signed   By: LeLars Pinks.D.   On: 10/25/2013 02:16  Medications: I have reviewed the patient's current medications. Scheduled Meds: . enoxaparin (LOVENOX) injection  30 mg Subcutaneous Q24H  . fentaNYL  50 mcg Transdermal Q72H  . folic acid  1 mg Per Tube Daily  . levETIRAcetam  500 mg Oral  BID  . pantoprazole  40 mg Oral QHS  . potassium chloride  10 mEq Intravenous Q1 Hr x 6  . predniSONE  60 mg Oral Q breakfast  . pregabalin  300 mg Oral BID  . valACYclovir  1,000 mg Oral BID   Continuous Infusions: . dextrose 50 mL (10/25/13 1901)   PRN Meds:.docusate sodium, ondansetron (ZOFRAN) IV, oxyCODONE-acetaminophen, polyethylene glycol, sodium chloride Assessment/Plan: Principal Problem:   Severe sepsis(995.92) Active Problems:   CAP (community acquired pneumonia)   Sepsis   Chronic pain   Hypoxia   Leukocytosis   Seizure disorder   Fibromyalgia   Anemia of chronic disease   Hematuria   History of renal calculi, multiple   Abdominal pain, epigastric   Nausea   Pharyngitis   Acute respiratory failure with hypoxia   Altered mental status   LOS: 12 days   #Acute Delirium - Resolved after discontinuation of psychotropic medications. Normal Brain MRI. Differential diagnosis includes medication side effects, autoimmune etiology, encephalitis, and post-ictal.  -continue on home meds  -avoid psychotropic medications that can cause delerium -diet dysphagia 1  -monitor  #Severe sepsis- Resolved. Pt currently afebrile, saturating 93% on RA. Pt with leukocytosis but is on solumedrol. CAP suspected, but no purulence on bronchoscopy. +blood culture from 2/5 with gram + cocci, possibly a contaminant. Normal CT abdomen and pelvis  -f/u repeat blood cultures -consider antibiotics if patient spikes fever or becomes hemodynamically unstable   #?Autoimmune Disease - p-ANCA positive; myeloperoxidase 1.2 (H); ESR 98 (but would expect this to be high in acute illness and is non-specific); ANA positive; c3/c4 negative; RF 13 (wnl); anti-DNA 1 (negative); CRP 23.8 (H); cryoglobulin negative; TSH/T4 low; ANA titer 1:80; p-ANCA titer 1:20 (nl=<1:20). CXR with evidence of hilar adenopathy, concerning for sarcoid.  Normal ACE level.  ?history of erythema M=nodsum. Family history of lupus in  maternal grandmother. -prednisone 49m qd -rheumatology consulted, appreciated recs   # Urinary retention - patient unable to void for 1 day, bladder scan showed 5068m CT showed Nephrolithiasis but no evidence of obstructive uropathy, as well as perivesical fat stranding. UA + for Hbg and protein, Urine P/C ratio 0.78. Hematuria likely related to recent foley catheter. Other possible etiologies include urethral calculi or glomerular bleeding. -bladder scan with I&O catheterization PRN -f/u urine culture  -clinically monitor for signs of obstructive uropathy   #Chronic Pain. Patient with chronic back pain in addition to diffuse abdominal pain. Has GERD symptoms refractory to PPI and ?melena today. Hgb 10.3 from 11.2 two days ago.  -recheck H&H -consider outpatient endoscopy  -continue lyrica 30084mid  -continue fentanyl patch  #Hypokalemia - Potassium 3.4 today, Mg/Phos normal -replete as needed   #Seizure disorder - No seizures while admitted.  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  -valtrex 1g BID  #Deconditioning - PT recommends HH PT  -continue PT/OT   #Anemia of chronic disease - Pt also with folate deficiency and probable B12. Ferritin was high but this was check in acute illness and therefore may be falsely elevated. Retic count wnl.  -continue folate supplementation   #Dispo- Disposition is deferred at this time,  awaiting improvement of current medical problems. Anticipated discharge in approximately 1-2 day(s).    This is a Careers information officer Note.  The care of the patient was discussed with Dr. Gordy Levan and the assessment and plan formulated with their assistance.  Please see their attached note for official documentation of the daily encounter.  Jake Bathe M 10/26/2013, 10:12 AM

## 2013-10-26 NOTE — Telephone Encounter (Signed)
A user error has taken place: encounter opened in error, closed for administrative reasons.

## 2013-10-27 DIAGNOSIS — R599 Enlarged lymph nodes, unspecified: Secondary | ICD-10-CM

## 2013-10-27 DIAGNOSIS — E876 Hypokalemia: Secondary | ICD-10-CM

## 2013-10-27 DIAGNOSIS — G4700 Insomnia, unspecified: Secondary | ICD-10-CM

## 2013-10-27 DIAGNOSIS — M549 Dorsalgia, unspecified: Secondary | ICD-10-CM

## 2013-10-27 DIAGNOSIS — N898 Other specified noninflammatory disorders of vagina: Secondary | ICD-10-CM

## 2013-10-27 DIAGNOSIS — I7789 Other specified disorders of arteries and arterioles: Secondary | ICD-10-CM

## 2013-10-27 DIAGNOSIS — R339 Retention of urine, unspecified: Secondary | ICD-10-CM

## 2013-10-27 LAB — GLUCOSE, CAPILLARY
GLUCOSE-CAPILLARY: 99 mg/dL (ref 70–99)
GLUCOSE-CAPILLARY: 142 mg/dL — AB (ref 70–99)
Glucose-Capillary: 95 mg/dL (ref 70–99)
Glucose-Capillary: 83 mg/dL (ref 70–99)

## 2013-10-27 LAB — URINE CULTURE
Colony Count: NO GROWTH
Culture: NO GROWTH

## 2013-10-27 LAB — BASIC METABOLIC PANEL
BUN: 6 mg/dL (ref 6–23)
CO2: 22 mEq/L (ref 19–32)
CREATININE: 0.73 mg/dL (ref 0.50–1.10)
Calcium: 8.3 mg/dL — ABNORMAL LOW (ref 8.4–10.5)
Chloride: 103 mEq/L (ref 96–112)
GFR calc non Af Amer: >90 mL/min (ref >90)
GLUCOSE: 109 mg/dL — AB (ref 70–99)
Potassium: 3.8 mEq/L (ref 3.7–5.3)
Sodium: 138 mEq/L (ref 137–147)

## 2013-10-27 LAB — SJOGRENS SYNDROME-B EXTRACTABLE NUCLEAR ANTIBODY: SSB (LA) (ENA) ANTIBODY, IGG: NEGATIVE

## 2013-10-27 LAB — SJOGRENS SYNDROME-A EXTRACTABLE NUCLEAR ANTIBODY: SSA (RO) (ENA) ANTIBODY, IGG: NEGATIVE

## 2013-10-27 LAB — ANTI-SMITH ANTIBODY: ENA SM AB SER-ACNC: NEGATIVE

## 2013-10-27 LAB — CARDIOLIPIN ANTIBODY: Phospholipids: 222 mg/dL (ref 151–264)

## 2013-10-27 MED ORDER — TAMSULOSIN HCL 0.4 MG PO CAPS
0.4000 mg | ORAL_CAPSULE | Freq: Every day | ORAL | Status: DC
  Administered 2013-10-27 – 2013-10-28 (×2): 0.4 mg via ORAL
  Filled 2013-10-27 (×2): qty 1

## 2013-10-27 MED ORDER — RAMELTEON 8 MG PO TABS
8.0000 mg | ORAL_TABLET | Freq: Every day | ORAL | Status: DC
  Administered 2013-10-27: 8 mg via ORAL
  Filled 2013-10-27 (×2): qty 1

## 2013-10-27 NOTE — Progress Notes (Signed)
MD verbalized okay for pt to not have IV access. Pt has an anticipated D/C.

## 2013-10-27 NOTE — Progress Notes (Signed)
Medical Student Daily Progress Note  Subjective: Ms. Paez continues to have difficulty voiding. She says that her urinary stream is decreased and she is experiencing significant suprapubic pain while urinating. It feels like the same pain she had in the past while passing kidney stones, but she has not noticed any passed stones while in the hospital. Flomax has helped her in the past. Her diarrhea is resolved. She complains of insomnia and is only sleeping 2-3 hours a night. She states she is not sure why she had prescribed oxygen at home but also admits that she used a family member's oxygen. She still complains of some weakness in her legs.   Objective: Vital signs in last 24 hours: Filed Vitals:   10/26/13 2202 10/27/13 0041 10/27/13 0410 10/27/13 1022  BP: 126/79 124/63 107/70 109/68  Pulse: 62 64 60 71  Temp: 98 F (36.7 C) 98 F (36.7 C) 98.3 F (36.8 C) 99.1 F (37.3 C)  TempSrc: Oral Oral Oral Oral  Resp: '18 18 16 16  ' Height:      Weight:      SpO2: 98% 98% 99% 99%   Weight change: 5.7 kg (12 lb 9.1 oz)  Intake/Output Summary (Last 24 hours) at 10/27/13 1129 Last data filed at 10/27/13 0825  Gross per 24 hour  Intake 1541.67 ml  Output   1000 ml  Net 541.67 ml   Physical Exam: Constitutional: Alert, in no acute distress and cooperative with exam.  Eyes: PERRLA, conjunctivae normal, no scleral icterus  Neck: Supple, Trachea midline  Cardiovascular: RRR, S1, S2 present, no MRG, DP 2+ b/l,  Pulmonary/Chest: normal respiratory effort, CTAB, no wheezes, rales, or rhonchi  Abdominal: Soft. +BS, LLQ tenderness; non-distended  Neurological: Alert and responsive and able to follow commands; cranial nerves II-XII are grossly intact, moving all extremities.  GU: bilateral labial lesions with open sores  Skin: Warm, dry and intact.  Extremities: 2+ pitting edema bilaterally  Psychiatric: Normal mood and affect.   Micro Results: Recent Results (from the past 240 hour(s))   CLOSTRIDIUM DIFFICILE BY PCR     Status: None   Collection Time    10/22/13 12:03 PM      Result Value Range Status   C difficile by pcr NEGATIVE  NEGATIVE Final  URINE CULTURE     Status: None   Collection Time    10/22/13  9:15 PM      Result Value Range Status   Specimen Description URINE, CATHETERIZED   Final   Special Requests NONE   Final   Culture  Setup Time     Final   Value: 10/22/2013 22:16     Performed at [facility]     Final   Value: NO GROWTH     Performed at [facility]   Culture     Final   Value: NO GROWTH     Performed at [facility]   Report Status 10/23/2013 FINAL   Final  CULTURE, BLOOD (ROUTINE X 2)     Status: None   Collection Time    10/22/13  9:51 PM      Result Value Range Status   Specimen Description BLOOD LEFT ARM   Final   Special Requests BOTTLES DRAWN AEROBIC AND ANAEROBIC 10CC   Final   Culture  Setup Time     Final   Value: 10/23/2013 00:26     Performed at [facility]  Final   Value: STAPHYLOCOCCUS SPECIES (COAGULASE NEGATIVE)     Note: THE SIGNIFICANCE OF ISOLATING THIS ORGANISM FROM A SINGLE SET OF BLOOD CULTURES WHEN MULTIPLE SETS ARE DRAWN IS UNCERTAIN. PLEASE NOTIFY THE MICROBIOLOGY DEPARTMENT WITHIN ONE WEEK IF SPECIATION AND SENSITIVITIES ARE REQUIRED.     Note: Gram Stain Report Called to,Read Back By and Verified With: TINA SAVAGE ON 10/23/2013 AT 10:31P BY WILEJ     Performed at [facility]   Report Status 10/25/2013 FINAL   Final  CULTURE, BLOOD (ROUTINE X 2)     Status: None   Collection Time    10/22/13 11:10 PM      Result Value Range Status   Specimen Description BLOOD LEFT FINGER   Final   Special Requests BOTTLES DRAWN AEROBIC ONLY 2CC FROM LEFT THUMB   Final   Culture  Setup Time     Final   Value: 10/23/2013 08:45     Performed at [facility]   Culture     Final   Value:        BLOOD CULTURE RECEIVED NO GROWTH TO DATE CULTURE WILL BE  HELD FOR 5 DAYS BEFORE ISSUING A FINAL NEGATIVE REPORT     Performed at [facility]   Report Status PENDING   Incomplete  WET PREP, GENITAL     Status: Abnormal   Collection Time    10/23/13  7:16 PM      Result Value Range Status   Yeast Wet Prep HPF POC NONE SEEN  NONE SEEN Final   Trich, Wet Prep NONE SEEN  NONE SEEN Final   Clue Cells Wet Prep HPF POC NONE SEEN  NONE SEEN Final   WBC, Wet Prep HPF POC MODERATE (*) NONE SEEN Final  CULTURE, BLOOD (ROUTINE X 2)     Status: None   Collection Time    10/24/13  2:40 AM      Result Value Range Status   Specimen Description BLOOD RIGHT ARM   Final   Special Requests BOTTLES DRAWN AEROBIC ONLY 10CC   Final   Culture  Setup Time     Final   Value: 10/24/2013 08:30     Performed at [facility]   Culture     Final   Value:        BLOOD CULTURE RECEIVED NO GROWTH TO DATE CULTURE WILL BE HELD FOR 5 DAYS BEFORE ISSUING A FINAL NEGATIVE REPORT     Performed at [facility]   Report Status PENDING   Incomplete  CULTURE, BLOOD (ROUTINE X 2)     Status: None   Collection Time    10/24/13  2:50 AM      Result Value Range Status   Specimen Description BLOOD RIGHT HAND   Final   Special Requests     Final   Value: BOTTLES DRAWN AEROBIC AND ANAEROBIC 10CC BLUE,7CC RED   Culture  Setup Time     Final   Value: 10/24/2013 08:30     Performed at [facility]   Culture     Final   Value:        BLOOD CULTURE RECEIVED NO GROWTH TO DATE CULTURE WILL BE HELD FOR 5 DAYS BEFORE ISSUING A FINAL NEGATIVE REPORT     Performed at [facility]   Report Status PENDING   Incomplete   Studies/Results: No results found. Medications: I have reviewed the patient's current medications. Scheduled Meds: . cyanocobalamin  1,000 mcg Intramuscular Daily  . enoxaparin (LOVENOX) injection  30 mg Subcutaneous Q24H  . fentaNYL  50 mcg Transdermal Q72H  . folic acid  1 mg Per Tube Daily  . levETIRAcetam  500 mg Oral BID  .  pantoprazole  40 mg Oral QHS  . predniSONE  60 mg Oral Q breakfast  . pregabalin  300 mg Oral BID  . valACYclovir  1,000 mg Oral BID   Continuous Infusions: . dextrose 1,000 mL (10/26/13 1402)   PRN Meds:.docusate sodium, ondansetron (ZOFRAN) IV, oxyCODONE-acetaminophen, polyethylene glycol, sodium chloride Assessment/Plan: Principal Problem:   Severe sepsis(995.92) Active Problems:   CAP (community acquired pneumonia)   Sepsis   Chronic pain   Hypoxia   Leukocytosis   Seizure disorder   Fibromyalgia   Anemia of chronic disease   Hematuria   History of renal calculi, multiple   Abdominal pain, epigastric   Nausea   Pharyngitis   Acute respiratory failure with hypoxia   Altered mental status   LOS: 13 days   # Urinary retention - patient unable to void for 2 days now, bladder scans have showed 400-572m, has been getting I&O catheterization. CT 2 days ago showed bilateral nephrolithiasis (stones measure 0.625mand 0.64m12mbut no evidence of obstructive uropathy, as well as perivesical fat stranding. Patient complains of pain similar to previous episodes when she has passed stones.  -flomax 0.4mg29m -bladder scan with I&O catheterization PRN -f/u urine culture  -clinically monitor for signs of obstructive uropathy   #Acute Delirium - Resolved after discontinuation of psychotropic medications. Normal Brain MRI. Differential diagnosis includes medication side effects, autoimmune etiology, encephalitis, and post-ictal.  -continue on home meds  -avoid psychotropic medications that can cause delerium -diet dysphagia 1  -monitor  #Severe sepsis- Resolved. Pt currently afebrile, saturating 99% on RA. Pt with leukocytosis but is on solumedrol. CAP suspected, but no purulence on bronchoscopy. +blood culture from 2/5 with gram + cocci, likely a contaminant. Normal CT abdomen and pelvis  -f/u repeat blood cultures -consider antibiotics if patient spikes fever or becomes hemodynamically  unstable   #?Autoimmune Disease - p-ANCA positive; myeloperoxidase 1.2 (H); ESR 98 (but would expect this to be high in acute illness and is non-specific); ANA positive; c3/c4 negative; RF 13 (wnl); anti-DNA 1 (negative); CRP 23.8 (H); cryoglobulin negative; TSH/T4 low; ANA titer 1:80; p-ANCA titer 1:20 (nl=<1:20). CXR with evidence of hilar adenopathy, concerning for sarcoid.  Normal ACE level.  ?history of erythema M=nodsum. Family history of lupus in maternal grandmother. -consider bronchoscopy with lung biopsy to confirm sarcoid, will consult pulmonology  -prednisone 60mg60m-rheumatology consulted, appreciated recs   #Insomnia - patient has chronic insomnia at home, likely worsened by steroid use in the hospital.  -ramelteon 64mg a34medtime  #Chronic Pain. Patient with chronic back pain in addition to diffuse abdominal pain.  -continue lyrica 300mg b45m-continue fentanyl patch  #Hypokalemia - Potassium 3.8 today, Mg/Phos normal -replete as needed   #Seizure disorder - No seizures while admitted.  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  -valtrex 1g BID  #Deconditioning - PT recommends HH PT  -continue PT/OT   #Anemia of chronic disease - Pt also with folate deficiency and probable B12. Ferritin was high but this was check in acute illness and therefore may be falsely elevated. Retic count wnl.  -continue folate supplementation   #Dispo- Disposition is deferred at  this time, awaiting improvement of current medical problems. Anticipated discharge in approximately 1-2 day(s).   This is a Careers information officer Note.  The care of the patient was discussed with Dr. Gordy Levan and the assessment and plan formulated with their assistance.  Please see their attached note for official documentation of the daily encounter.  Jake Bathe M 10/27/2013, 11:29 AM

## 2013-10-27 NOTE — Progress Notes (Signed)
I repeated the critical or key portions of the exam.  I confirmed/revised the medical student's history, exam, assessment and plan.   

## 2013-10-27 NOTE — Progress Notes (Addendum)
PULMONOLOGY/CRITICAL CARE   Name: [name] [name] MRN: [id number] DOB: 10/12/1960    ADMISSION DATE:  10/14/2013 CONSULTATION DATE: 10/14/13  REFERRING MD :  IM PRIMARY SERVICE: IM  CHIEF COMPLAINT:  fever  BRIEF PATIENT DESCRIPTION: 53 yo female with recent admission for CAP re-presents with fever, leukocytosis, chest pain, and hypoxia in setting of possible seizure vs pre-syncope.  SIGNIFICANT EVENTS / STUDIES:  1/28 Abd [place] >>No hydronephrosis, Left kidney 84m stone?, Right kidney 641mstone? Mild intra and extrahepatic biliary ductal prominence,  1/28 CTA chest >>> No PE.  Bilateral prominently upper lobe mixed airspace and ground-glass opacities, multi lobar pneumonia is favored.  Mediastinal and bilateral hilar adenopathy  1/28 Bronchoscopy >>> unimpressive secretions, no DAH, no hsv lesions, no mass lesions, BAL sent. 1/28 TTE >>> EF 65-70%, grade 1 DD, PA peak 38, no change from prior 1/28 CT head >>> Negative for acute process. 1/29 EEG >>> no evidence of seizure activity 1/31 2. 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 2/3 remains on precedex 2/4 off precedex overnight, improved orientation  LINES / TUBES: 1/28 LIJ >>> 2/1 (patient removed) 1/28 OETT >>>2/1 1/28 Foley >> 1/29 A line >>>out  CULTURES: 1/18 Blood cultures >> Neg 1/27 UCx >>>> Neg 1/27 BCx >>>> neg 1/28 Strep culture >>>neg 1/28 MRSA >>> neg 1/28 Pneumocystis smear (BAL) >>> neg 1/28 AFB (BAL) >>>> smear neg, culture pending 1/28 Resp Culture (BAL) >>> NGTD 1/28 Fungus Culture (BAL) >>> No yeast or fungal elements seen, culture in process 1/28 GAS (BAL) >>>>neg 1/28 Respiratory virus panel (BAL) >>>neg HIV neg  AUTOIMMUNE: Collected 1/28  p-ANCA>>>pos  c-ANCA >>> neg  Atypical p-ANCA>>>neg  Myeloperoxidase Abx >>> 1.2 (pos) Serine Protease 3 >>> <1.0 (neg) ESR >>> 98 ANA >>> pos C3 >>> 108 C4 >>> 22 RF >>> 13 Total Complement >>>56 dsDNA Ab >>>  1 CRP >>> 23.8  Cryoglobulin >>>none detected  ANTIBIOTICS: Home on levofloxacin 1/27  Vanc >>>1/30 1/27 Zosyn >>>1/30 1/30 ceftriaxone>>>2/3  1/27 Azithromycin >>>1/29 1/28 tamiflu stopped 1/31  VITAL SIGNS: Temp:  [98 F (36.7 C)-99.1 F (37.3 C)] 98.9 F (37.2 C) (02/09 1405) Pulse Rate:  [60-71] 62 (02/09 1405) Resp:  [16-18] 18 (02/09 1405) BP: (102-126)/(63-79) 102/68 mmHg (02/09 1405) SpO2:  [93 %-99 %] 93 % (02/09 1405)    INTAKE / OUTPUT: Intake/Output     02/08 0701 - 02/09 0700 02/09 0701 - 02/10 0700   P.O. 240 705   I.V. (mL/kg) 751.7 (12.8)    IV Piggyback 1100    Total Intake(mL/kg) 2091.7 (35.6) 705 (12)   Urine (mL/kg/hr) 1525 (1.1) 250 (0.5)   Total Output 1525 250   Net +566.7 +455         SUBJECTIVE:  Comfortable, no complaints at this time.  PHYSICAL EXAMINATION: General: comfortable HEENT: NCAT Cardiac: rrr, no rubs, murmurs or gallops Pulm: clear breath sounds, no wheezing Abd: soft, nontender, nondistended, hypoactive bowel sounds Ext: warm and well perfused Neuro: MAE x 4, oriented to person   LABS:  CBC  Recent Labs Lab 10/22/13 1310 10/24/13 0240 10/26/13 0525  WBC 14.6* 15.7* 11.1*  HGB 12.3 11.2* 10.3*  HCT 37.9 35.1* 32.9*  PLT 362 410* 355   BMET  Recent Labs Lab 10/25/13 0230 10/26/13 0525 10/27/13 0905  NA 140 139 138  K 3.9 3.4* 3.8  CL 103 103 103  CO2 '22 23 22  ' BUN '13 11 6  ' CREATININE 0.83 0.80  0.73  GLUCOSE 61* 103* 109*    ASSESSMENT / PLAN:  Mediastinal and bilateral hilar adenopathy - as noted from Chest CTA 1/28 Minimal elevation vasculitis rheum markers, did improve with lasix, steroids and ABX off vent  -Will defer bronchoscopic evaluation for now -Follow up CT in 2 weeks to evaluate adenopathy and residual remaining parenchyma lung changesin pulm clinic, if present would favor open lung for dx vasculitis, unlikely EBUS -would dc on 1 mg/kg pred for now with concerns vascuiitis -if CT  resolved, likely big infectious component -does have pulm pressure elevation which also needs to be repeated for Pa pressures as outpt -remains on RA and doing well for now  I have fully examined this patient and agree with above findings.    And edited in full  Lavon Paganini. Titus Mould, MD, FACP Pgr: [facility], ACNP [facility] Pulmonology/Critical Care Pager [phone] or [phone]

## 2013-10-27 NOTE — Evaluation (Signed)
Occupational Therapy Evaluation Patient Details Name: [name] [name] MRN: [id number] DOB: 01-Oct-1960 Today's Date: 10/27/2013 Time: 4098-1191 OT Time Calculation (min): 22 min  OT Assessment / Plan / Recommendation History of present illness 53 yo female with recent admission for CAP re-presents with fever, leukocytosis, chest pain, and hypoxia in setting of possible seizure vs pre-syncope. PMHx fibromyalgia with chronic pain   Clinical Impression   Pt admitted with above.  She demonstrates the below listed deficits and will benefit from continued OT to maximize safety and independence with BADLs.  She demonstrates impaired safety and cognition, impaired balance, decreased activity tolerance.  She requires min A overall for BADLs.  Recommend 24 hour supervision at discharge    OT Assessment  Patient needs continued OT Services    Follow Up Recommendations  Home health OT;Supervision/Assistance - 24 hour    Barriers to Discharge      Equipment Recommendations  3 in 1 bedside comode;Tub/shower seat    Recommendations for Other Services    Frequency  Min 2X/week    Precautions / Restrictions Precautions Precautions: Fall Precaution Comments: Pt reports she has been falling frequently x 1 year.  She reports she often crawls Restrictions Weight Bearing Restrictions: No   Pertinent Vitals/Pain     ADL  Eating/Feeding: Independent Where Assessed - Eating/Feeding: Chair Grooming: Wash/dry hands;Brushing hair;Min guard Where Assessed - Grooming: Unsupported standing Upper Body Bathing: Supervision/safety Where Assessed - Upper Body Bathing: Supported sitting;Unsupported sitting Lower Body Bathing: Min guard Where Assessed - Lower Body Bathing: Supported sit to stand;Unsupported sit to stand Upper Body Dressing: Supervision/safety Where Assessed - Upper Body Dressing: Unsupported sitting Lower Body Dressing: Supervision/safety Where Assessed - Lower Body Dressing: Unsupported sit to  stand;Supported sit to Pharmacist, hospital: Charity fundraiser Method: Sit to stand;Stand Wellsite geologist: Bedside commode;Comfort height toilet Toileting - Architect and Hygiene: Min guard;Minimal assistance Where Assessed - Engineer, mining and Hygiene: Standing Transfers/Ambulation Related to ADLs: min guard assist initially progressing to min A as she fatigues ADL Comments: Pt initially requires min guard assist for standing balance, but increases to min A as she fatigues.     OT Diagnosis: Generalized weakness;Cognitive deficits  OT Problem List: Decreased strength;Decreased activity tolerance;Impaired balance (sitting and/or standing);Decreased coordination;Decreased cognition;Decreased safety awareness;Decreased knowledge of use of DME or AE OT Treatment Interventions: Self-care/ADL training;DME and/or AE instruction;Therapeutic activities;Cognitive remediation/compensation;Patient/family education;Balance training   OT Goals(Current goals can be found in the care plan section) Acute Rehab OT Goals Patient Stated Goal: to get better OT Goal Formulation: With patient Time For Goal Achievement: 11/10/13 Potential to Achieve Goals: Good ADL Goals Pt Will Perform Grooming: with supervision;standing Pt Will Perform Upper Body Bathing: with modified independence;sitting Pt Will Perform Lower Body Bathing: with supervision;sit to/from stand Pt Will Perform Upper Body Dressing: with modified independence;sitting Pt Will Perform Lower Body Dressing: with supervision;sit to/from stand Pt Will Transfer to Toilet: with supervision;regular height toilet;bedside commode;grab bars;ambulating Pt Will Perform Toileting - Clothing Manipulation and hygiene: with supervision;sit to/from stand Pt Will Perform Tub/Shower Transfer: Shower transfer;with supervision;ambulating;shower seat;rolling walker  Visit Information  Last OT  Received On: 10/27/13 Assistance Needed: +1 (for short distance ambulation) History of Present Illness: 53 yo female with recent admission for CAP re-presents with fever, leukocytosis, chest pain, and hypoxia in setting of possible seizure vs pre-syncope. PMHx fibromyalgia with chronic pain       Prior Functioning     Home Living Family/patient expects to be discharged to::  Private residence Living Arrangements: Parent;Other relatives Available Help at Discharge: Family;Available 24 hours/day Type of Home: House Home Access: Stairs to enter Entergy CorporationEntrance Stairs-Number of Steps: 1 Entrance Stairs-Rails: None Home Layout: One level Home Equipment: Cane - single point;Walker - 2 wheels Prior Function Level of Independence: Needs assistance Gait / Transfers Assistance Needed: using cane PTA with frequent falls per mother ADL's / Homemaking Assistance Needed: Pt reports she has been able to perform BADLs but with frequent falls  Communication Communication: No difficulties         Vision/Perception Vision - Assessment Vision Assessment: Vision not tested Perception Perception: Within Functional Limits Praxis Praxis: Intact   Cognition  Cognition Arousal/Alertness: Awake/alert Behavior During Therapy: WFL for tasks assessed/performed Overall Cognitive Status: Impaired/Different from baseline Area of Impairment: Attention;Safety/judgement;Awareness;Problem solving Current Attention Level: Selective Memory: Decreased short-term memory Safety/Judgement: Decreased awareness of safety Awareness: Intellectual Problem Solving: Slow processing;Requires verbal cues General Comments: Pt demonstrates impaired judgement and safety.  Requires cues to move to sitting position as her LEs become weak - unable to problem solve through this option independently.  Unable to recall what the cardiac monitor is for - asks to remove it x 2    Extremity/Trunk Assessment Upper Extremity  Assessment Upper Extremity Assessment: Generalized weakness RUE Deficits / Details: tremor noted bil. UEs LUE Deficits / Details: tremor noted bil. UEs Lower Extremity Assessment Lower Extremity Assessment: Defer to PT evaluation Cervical / Trunk Assessment Cervical / Trunk Assessment: Normal     Mobility Transfers Equipment used: Rolling walker (2 wheeled) Transfers: Sit to/from UGI CorporationStand;Stand Pivot Transfers Sit to Stand: Min guard;Min assist Stand pivot transfers: Min guard;Min assist General transfer comment: Initially min guard assist progressing to min A.  Mod verbal cues for walker safety      Exercise     Balance Balance Overall balance assessment: Needs assistance Sitting-balance support: Feet supported Sitting balance-Leahy Scale: Good Standing balance support: No upper extremity supported Standing balance-Leahy Scale: Good Standing balance comment: good, but decreases to fair as she fatigues   End of Session OT - End of Session Equipment Utilized During Treatment: Rolling walker Activity Tolerance: Patient tolerated treatment well Patient left: in chair;with call bell/phone within reach;with chair alarm set Nurse Communication: Mobility status  GO     [name] [name] M 10/27/2013, 11:02 AM

## 2013-10-27 NOTE — Progress Notes (Addendum)
NUTRITION FOLLOW UP  Intervention:   RD to continue to follow nutrition care plan. Recommend addition of Glucerna Shake po daily, if meal intake does not continue to improve.  Nutrition Dx:  Inadequate oral intake r/t limited appetite AEB pt report.  Goal:   Intake to meet >90% of estimated nutrition needs. Unmet.  Monitor:   PO intake, labs, weight trend.  Assessment:   Pt admitted with fever and respiratory distress in setting of PNA, possible seizure, and pre-syncope. Pt with recent admission for CAP.    Patient was receiving Oxepa TF while intubated. She was extubated on 2/1. TF has been off since extubation.  Pt found to be non-verbal and not swallowing after extubation; seen by psych on 2/4; swallowing issues and AMS resolved with time - MD suspects it was 2/2 delirium from ICU meds. Pt seen again by SLP on 2/5 with recommendations for Dysphagia 1 diet with thin liquids. Pt advanced to Carbohydrate Modified on 2/9. Pt has been eating poor, but was able to eat all of her lunch this afternoon. Pt would not like scheduled supplements at this time, but is open to them if intake doesn't remain adequate.   Height: Ht Readings from Last 1 Encounters:  10/25/13 5\' 1"  (1.549 m)    Weight Status:   Wt Readings from Last 1 Encounters:  10/26/13 129 lb 6.6 oz (58.7 kg)  10/16/13  133 lb 13.1 oz (60.7 kg)  10/15/13  129 lb 3 oz (58.6 kg)   Body mass index is 24.46 kg/(m^2). WNL  Re-estimated needs:  Kcal: 1400-1500 Protein: 70-90 gm Fluid: 1.4-1.6 L  Skin: vaginal skin tear  Diet Order: Carb Control   Intake/Output Summary (Last 24 hours) at 10/27/13 1414 Last data filed at 10/27/13 1358  Gross per 24 hour  Intake   1705 ml  Output   1250 ml  Net    455 ml    Last BM: 2/8   Labs:   Recent Labs Lab 10/21/13 0222  10/24/13 0240 10/25/13 0230 10/26/13 0525 10/27/13 0905  NA 145  < > 143 140 139 138  K 3.5*  < > 2.9* 3.9 3.4* 3.8  CL 100  < > 104 103 103 103   CO2 25  < > 23 22 23 22   BUN 13  < > 9 13 11 6   CREATININE 0.66  < > 0.66 0.83 0.80 0.73  CALCIUM 8.7  < > 8.2* 8.7 8.6 8.3*  MG 2.6*  --  2.3 2.3  --   --   PHOS 3.2  --  2.7  --   --   --   GLUCOSE 100*  < > 108* 61* 103* 109*  < > = values in this interval not displayed.  CBG (last 3)   Recent Labs  10/27/13 0407 10/27/13 0752 10/27/13 1215  GLUCAP 95 83 142*    Scheduled Meds: . cyanocobalamin  1,000 mcg Intramuscular Daily  . enoxaparin (LOVENOX) injection  30 mg Subcutaneous Q24H  . fentaNYL  50 mcg Transdermal Q72H  . folic acid  1 mg Per Tube Daily  . levETIRAcetam  500 mg Oral BID  . pantoprazole  40 mg Oral QHS  . predniSONE  60 mg Oral Q breakfast  . pregabalin  300 mg Oral BID  . ramelteon  8 mg Oral QHS  . tamsulosin  0.4 mg Oral Daily  . valACYclovir  1,000 mg Oral BID    Continuous Infusions: . dextrose 1,000 mL (10/26/13  1402)    Jarold MottoSamantha Camara Rosander MS, RD, LDN Pager: [phone] After-hours pager: [phone]

## 2013-10-27 NOTE — Progress Notes (Signed)
  Date: 10/27/2013  Patient name: [name] [name]  Medical record number: [id number]  Date of birth: 02/28/1961   This patient has been seen and the plan of care was discussed with the house staff. Please see their note for complete details. I concur with their findings with the following additions/corrections:    patient continues to improve on a daily basis. She looks much more comfortable today she is able to walk with physical therapy with a walker which is excellent is as apparently she was unable to walk 2 days ago.  I still  DO NOT have an underlying diagnosis.  We will ask CCM if they would consider a bronchoscopic guided biopsy to help assist at diagnosis. Perhaps pt might end up needing a mediastinoscopy by CT surgery to get tissue from one of her mediastinal lymph nodes as well.  She curiously states that in [name] she was on intermittent O2 but does not know why. Was she worked up for this condition there and are we lacking records that would provide the diagnosis?  I am in communication with Alben DeedsJames Beekman from Rheumatology and his group per policy does NOT do inpatient consults BUT he will [facility] and will arrange for [facility] with him.  We will need to document her O2 sats with ambulation at room air she is doing great.   [name], MD 10/27/2013, 1:06 PM

## 2013-10-27 NOTE — Progress Notes (Signed)
Pt's Sp02 95% on room while ambulating.

## 2013-10-27 NOTE — Progress Notes (Signed)
Subjective:   Pt is sitting in the chair this AM and looks much better. She has no additional complaints.    Objective:   Vital signs in last 24 hours: Filed Vitals:   10/27/13 0041 10/27/13 0410 10/27/13 1022 10/27/13 1405  BP: 124/63 107/70 109/68 102/68  Pulse: 64 60 71 62  Temp: 98 F (36.7 C) 98.3 F (36.8 C) 99.1 F (37.3 C) 98.9 F (37.2 C)  TempSrc: Oral Oral Oral Oral  Resp: '18 16 16 18  ' Height:      Weight:      SpO2: 98% 99% 99% 93%   Weight change: 12 lb 9.1 oz (5.7 kg)  Intake/Output Summary (Last 24 hours) at 10/27/13 2036 Last data filed at 10/27/13 1800  Gross per 24 hour  Intake    927 ml  Output    950 ml  Net    -23 ml    Physical Exam: Constitutional: Vital signs reviewed.  Patient is in no acute distress and cooperative with exam.   Head: Normocephalic and atraumatic Eyes: EOMI, conjunctivae normal, no scleral icterus  Neck: Supple, Trachea midline Cardiovascular: RRR, S1, S2 present, no MRG, DP 2+ b/l Pulmonary/Chest: normal respiratory effort, CTAB, no wheezes, rales, or rhonchi Abdominal: Soft. +BS, Diffuse tenderness; non-distended Neurological: A&O x3; CNII-XII are grossly intact, moving all extremities  GU: bilateral labial lesions with open sores  Skin: Warm, dry and intact.  Psychiatric: Normal mood and affect.   Lab Results:  BMP:  Recent Labs Lab 10/21/13 0222  10/24/13 0240 10/25/13 0230 10/26/13 0525 10/27/13 0905  NA 145  < > 143 140 139 138  K 3.5*  < > 2.9* 3.9 3.4* 3.8  CL 100  < > 104 103 103 103  CO2 25  < > '23 22 23 22  ' GLUCOSE 100*  < > 108* 61* 103* 109*  BUN 13  < > '9 13 11 6  ' CREATININE 0.66  < > 0.66 0.83 0.80 0.73  CALCIUM 8.7  < > 8.2* 8.7 8.6 8.3*  MG 2.6*  --  2.3 2.3  --   --   PHOS 3.2  --  2.7  --   --   --   < > = values in this interval not displayed.  CBC:  Recent Labs Lab 10/24/13 0240 10/26/13 0525  WBC 15.7* 11.1*  NEUTROABS 12.2*  --   HGB 11.2* 10.3*  HCT 35.1* 32.9*  MCV  87.8 89.9  PLT 410* 355    Coagulation: No results found for this basename: LABPROT, INR,  in the last 168 hours  CBG:            Recent Labs Lab 10/26/13 1707 10/26/13 2152 10/27/13 0159 10/27/13 0407 10/27/13 0752 10/27/13 1215  GLUCAP 136* 105* 99 95 83 142*           HA1C:      No results found for this basename: HGBA1C,  in the last 168 hours  Lipid Panel: No results found for this basename: CHOL, HDL, LDLCALC, TRIG, CHOLHDL, LDLDIRECT,  in the last 168 hours  LFTs:  Recent Labs Lab 10/22/13 0234 10/24/13 0240  AST 37 26  ALT 86* 61*  ALKPHOS 91 87  BILITOT 0.4 0.5  PROT 5.9* 6.3  ALBUMIN 2.2* 2.6*    Pancreatic Enzymes: No results found for this basename: LIPASE, AMYLASE,  in the last 168 hours  Ammonia: No results found for this basename: AMMONIA,  in the last 168  hours  Cardiac Enzymes:  Recent Labs Lab 10/25/13 0230  CKTOTAL 31  TROPONINI <0.30   Lab Results  Component Value Date   CKTOTAL 31 10/25/2013   TROPONINI <0.30 10/25/2013    EKG:  Date/Time:    Ventricular Rate:    PR Interval:    QRS Duration:   QT Interval:    QTC Calculation:   R Axis:     Text Interpretation:    BNP: No results found for this basename: PROBNP,  in the last 168 hours  D-Dimer: No results found for this basename: DDIMER,  in the last 168 hours  Urinalysis:  Recent Labs Lab 10/26/13 0836  COLORURINE RED*  LABSPEC 1.027  PHURINE 6.0  GLUCOSEU NEGATIVE  HGBUR LARGE*  BILIRUBINUR MODERATE*  KETONESUR 15*  PROTEINUR 100*  UROBILINOGEN 0.2  NITRITE NEGATIVE  LEUKOCYTESUR MODERATE*    Micro Results: Recent Results (from the past 240 hour(s))  CLOSTRIDIUM DIFFICILE BY PCR     Status: None   Collection Time    10/22/13 12:03 PM      Result Value Range Status   C difficile by pcr NEGATIVE  NEGATIVE Final  URINE CULTURE     Status: None   Collection Time    10/22/13  9:15 PM      Result Value Range Status   Specimen Description URINE,  CATHETERIZED   Final   Special Requests NONE   Final   Culture  Setup Time     Final   Value: 10/22/2013 22:16     Performed at [place]     Final   Value: NO GROWTH     Performed at [facility]   Culture     Final   Value: NO GROWTH     Performed at [facility]   Report Status 10/23/2013 FINAL   Final  CULTURE, BLOOD (ROUTINE X 2)     Status: None   Collection Time    10/22/13  9:51 PM      Result Value Range Status   Specimen Description BLOOD LEFT ARM   Final   Special Requests BOTTLES DRAWN AEROBIC AND ANAEROBIC 10CC   Final   Culture  Setup Time     Final   Value: 10/23/2013 00:26     Performed at [facility]   Culture     Final   Value: STAPHYLOCOCCUS SPECIES (COAGULASE NEGATIVE)     Note: THE SIGNIFICANCE OF ISOLATING THIS ORGANISM FROM A SINGLE SET OF BLOOD CULTURES WHEN MULTIPLE SETS ARE DRAWN IS UNCERTAIN. PLEASE NOTIFY THE MICROBIOLOGY DEPARTMENT WITHIN ONE WEEK IF SPECIATION AND SENSITIVITIES ARE REQUIRED.     Note: Gram Stain Report Called to,Read Back By and Verified With: TINA SAVAGE ON 10/23/2013 AT 10:31P BY WILEJ     Performed at [facility]   Report Status 10/25/2013 FINAL   Final  CULTURE, BLOOD (ROUTINE X 2)     Status: None   Collection Time    10/22/13 11:10 PM      Result Value Range Status   Specimen Description BLOOD LEFT FINGER   Final   Special Requests BOTTLES DRAWN AEROBIC ONLY 2CC FROM LEFT THUMB   Final   Culture  Setup Time     Final   Value: 10/23/2013 08:45     Performed at [facility]   Culture     Final   Value:        BLOOD  CULTURE RECEIVED NO GROWTH TO DATE CULTURE WILL BE HELD FOR 5 DAYS BEFORE ISSUING A FINAL NEGATIVE REPORT     Performed at [facility]   Report Status PENDING   Incomplete  WET PREP, GENITAL     Status: Abnormal   Collection Time    10/23/13  7:16 PM      Result Value Range Status   Yeast Wet Prep HPF POC NONE SEEN  NONE SEEN Final    Trich, Wet Prep NONE SEEN  NONE SEEN Final   Clue Cells Wet Prep HPF POC NONE SEEN  NONE SEEN Final   WBC, Wet Prep HPF POC MODERATE (*) NONE SEEN Final  CULTURE, BLOOD (ROUTINE X 2)     Status: None   Collection Time    10/24/13  2:40 AM      Result Value Range Status   Specimen Description BLOOD RIGHT ARM   Final   Special Requests BOTTLES DRAWN AEROBIC ONLY 10CC   Final   Culture  Setup Time     Final   Value: 10/24/2013 08:30     Performed at [facility]   Culture     Final   Value:        BLOOD CULTURE RECEIVED NO GROWTH TO DATE CULTURE WILL BE HELD FOR 5 DAYS BEFORE ISSUING A FINAL NEGATIVE REPORT     Performed at [facility]   Report Status PENDING   Incomplete  CULTURE, BLOOD (ROUTINE X 2)     Status: None   Collection Time    10/24/13  2:50 AM      Result Value Range Status   Specimen Description BLOOD RIGHT HAND   Final   Special Requests     Final   Value: BOTTLES DRAWN AEROBIC AND ANAEROBIC 10CC BLUE,7CC RED   Culture  Setup Time     Final   Value: 10/24/2013 08:30     Performed at [facility]   Culture     Final   Value:        BLOOD CULTURE RECEIVED NO GROWTH TO DATE CULTURE WILL BE HELD FOR 5 DAYS BEFORE ISSUING A FINAL NEGATIVE REPORT     Performed at [facility]   Report Status PENDING   Incomplete  URINE CULTURE     Status: None   Collection Time    10/26/13  8:36 AM      Result Value Range Status   Specimen Description URINE, RANDOM   Final   Special Requests NONE   Final   Culture  Setup Time     Final   Value: 10/26/2013 18:01     Performed at [facility]     Final   Value: NO GROWTH     Performed at [facility]   Culture     Final   Value: NO GROWTH     Performed at [facility]   Report Status 10/27/2013 FINAL   Final    Blood Culture:    Component Value Date/Time   SDES URINE, RANDOM 10/26/2013 0836   SPECREQUEST NONE 10/26/2013 0836   CULT  Value: NO GROWTH Performed  at [facility] 10/26/2013 0836   REPTSTATUS 10/27/2013 FINAL 10/26/2013 0836    Studies/Results: No results found.  Medications:  Scheduled Meds: . cyanocobalamin  1,000 mcg Intramuscular Daily  . enoxaparin (LOVENOX) injection  30 mg Subcutaneous Q24H  . fentaNYL  50 mcg Transdermal  H37J  . folic acid  1 mg Per Tube Daily  . levETIRAcetam  500 mg Oral BID  . pantoprazole  40 mg Oral QHS  . predniSONE  60 mg Oral Q breakfast  . pregabalin  300 mg Oral BID  . ramelteon  8 mg Oral QHS  . tamsulosin  0.4 mg Oral Daily  . valACYclovir  1,000 mg Oral BID   Continuous Infusions: . dextrose 1,000 mL (10/26/13 1402)   PRN Meds:.docusate sodium, ondansetron (ZOFRAN) IV, oxyCODONE-acetaminophen, polyethylene glycol, sodium chloride  Antibiotics: Anti-infectives   Start     Dose/Rate Route Frequency Ordered Stop   10/24/13 1400  valACYclovir (VALTREX) tablet 1,000 mg     1,000 mg Oral 2 times daily 10/24/13 1255 11/03/13 0959   10/23/13 2200  acyclovir (ZOVIRAX) tablet 400 mg  Status:  Discontinued     400 mg Oral 3 times daily 10/23/13 1918 10/24/13 1253   10/17/13 1100  cefTRIAXone (ROCEPHIN) 1 g in dextrose 5 % 50 mL IVPB     1 g 100 mL/hr over 30 Minutes Intravenous Every 24 hours 10/17/13 1020 10/21/13 1130   10/15/13 1300  oseltamivir (TAMIFLU) 6 MG/ML suspension 75 mg  Status:  Discontinued     75 mg Oral 2 times daily 10/15/13 1252 10/18/13 0932   10/15/13 1245  oseltamivir (TAMIFLU) capsule 75 mg  Status:  Discontinued     75 mg Oral 2 times daily 10/15/13 1227 10/15/13 1252   10/15/13 0730  vancomycin (VANCOCIN) IVPB 750 mg/150 ml premix  Status:  Discontinued     750 mg 150 mL/hr over 60 Minutes Intravenous Every 12 hours 10/14/13 2031 10/17/13 1020   10/15/13 0000  azithromycin (ZITHROMAX) 500 mg in dextrose 5 % 250 mL IVPB  Status:  Discontinued     500 mg 250 mL/hr over 60 Minutes Intravenous Every 24 hours 10/14/13 2351 10/16/13 0847   10/14/13 1930   piperacillin-tazobactam (ZOSYN) IVPB 3.375 g  Status:  Discontinued     3.375 g 12.5 mL/hr over 240 Minutes Intravenous 3 times per day 10/14/13 1855 10/17/13 1020   10/14/13 1900  vancomycin (VANCOCIN) IVPB 1000 mg/200 mL premix     1,000 mg 200 mL/hr over 60 Minutes Intravenous  Once 10/14/13 1855 10/14/13 2030   10/14/13 1845  oseltamivir (TAMIFLU) capsule 75 mg     75 mg Oral  Once 10/14/13 1833 10/14/13 1851     Antibiotics Given (last 72 hours)   Date/Time Action Medication Dose   10/24/13 2152 Given   valACYclovir (VALTREX) tablet 1,000 mg 1,000 mg   10/25/13 1121 Given   valACYclovir (VALTREX) tablet 1,000 mg 1,000 mg   10/25/13 2116 Given   valACYclovir (VALTREX) tablet 1,000 mg 1,000 mg   10/26/13 1000 Given   valACYclovir (VALTREX) tablet 1,000 mg 1,000 mg   10/26/13 2259 Given   valACYclovir (VALTREX) tablet 1,000 mg 1,000 mg   10/27/13 1052 Given   valACYclovir (VALTREX) tablet 1,000 mg 1,000 mg      Day of Hospitalization:  13 Consults: Treatment Team:  Durward Parcel, MD Md Pccm, MD  Assessment/Plan:   #?Autoimmune Disease - p-ANCA positive; myeloperoxidase 1.2 (H); ESR 98 (but would expect this to be high in acute illness and is non-specific); ANA positive; c3/c4 negative; RF 13 (wnl); anti-DNA 1 (negative); CRP 23.8 (H); cryoglobulin negative; TSH/T4 low; ANA titer 1:80; p-ANCA titer 1:20 (nl=<1:20).  ACE level wnl.  -consulted rheumatology, Dr. Amil Amen, but does not provide inpatient consults but  will review records and arrange f/u  -further workup as indicated; however, pt has much improved and further workup may be completed as an outpatient  -continue prednisone 6m daily  #Mediastinal and bilateral hilar adenopathy - Chest CTA 1/28 revealed mediastinal/hilar adenopathy. Consulted pulmonology for possible biopsy but recommended f/u CT chest in 2 weeks to re-evaluate adenopathy and residual parenchyma.  They will defer bronchoscopic evaluation  for now but would favor open lung biopsy for diagnosis of vasculitis.  Recommended continuing on 125mkg prednisone and if CT findings resolve likely infectious.  Also reports PA pressures are elevated which would likely be d/t interstitial lung disease.  Pt also gives a h/o infrequent oxygen requirements at home which may be necessary d/t pulmonary HTN which would also explain her tachycardia at times.  -f/u CT chest in 2 weeks for resolution   #Acute Delirium - Resolved.  Pt recent transfer from ICU to IMTS.   Pt is able to carry on a normal conversation with [place].  We discontinued all of her psychotropic medications and put her back on her regular home meds.  Most likely etiology of acute delirium was combination of medications including precedex.  MRI was not revealing of any acute infarct or mass with normal enhancement.  Patchy subcortical hyperintensities in the left parietal white matter. These are nonspecific and could be seen with chronic microvascular ischemia. They do not appear to be acute and do not  enhance.  -continue PT/OT  -diet dysphagia 3 -monitor  #Severe sepsis secondary to CAP - Resolved.  1/2 BCx did grow gpc in clusters but likely contaminant and pt is not septic.  Additional BCx show NGTD.  -pt much improved; pending d/c tomorrow  #Chronic Pain - Pt complains mainly of back pain that is chronic and states it is due to DDD.  CT Abd did not show any acute process.  -continue lyrica 30029mid -continue fentanyl patch q2h PRN -VitD pending  #Hypokalemia - Resolved.  Mg/phos wnl.  -replete as needed -BMP in AM  #Seizure disorder - No seizures while admitted.  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 -continue PT/OT  #Anemia of chronic disease - Pt also with folate deficiency and probable  B12.  Ferritin was high but this was check in acute illness and therefore may be falsely elevated.  Retic count wnl. Pt reports a FH of anemia and states her mother needed B12 supplementation.  -continue folate supplementation -1000mcg27m injection daily -MMA pending  #Dispo- Pt has much improved and should be ready to discharge in the AM.      LOS: 13 days   JacqueMichail JewelsGY-1, Internal Medicine  [phone] Mon-Fri) 10/27/2013, 8:36 PM

## 2013-10-28 DIAGNOSIS — R918 Other nonspecific abnormal finding of lung field: Secondary | ICD-10-CM

## 2013-10-28 LAB — VITAMIN D 25 HYDROXY (VIT D DEFICIENCY, FRACTURES): VIT D 25 HYDROXY: 13 ng/mL — AB (ref 30–89)

## 2013-10-28 LAB — CBC
HCT: 31.3 % — ABNORMAL LOW (ref 36.0–46.0)
Hemoglobin: 9.6 g/dL — ABNORMAL LOW (ref 12.0–15.0)
MCH: 27.8 pg (ref 26.0–34.0)
MCHC: 30.7 g/dL (ref 30.0–36.0)
MCV: 90.7 fL (ref 78.0–100.0)
PLATELETS: 367 10*3/uL (ref 150–400)
RBC: 3.45 MIL/uL — AB (ref 3.87–5.11)
RDW: 17.1 % — ABNORMAL HIGH (ref 11.5–15.5)
WBC: 9.4 10*3/uL (ref 4.0–10.5)

## 2013-10-28 LAB — BASIC METABOLIC PANEL
BUN: 8 mg/dL (ref 6–23)
CO2: 24 meq/L (ref 19–32)
Calcium: 7.9 mg/dL — ABNORMAL LOW (ref 8.4–10.5)
Chloride: 108 mEq/L (ref 96–112)
Creatinine, Ser: 0.8 mg/dL (ref 0.50–1.10)
GFR calc Af Amer: >90 mL/min (ref >90)
GFR calc non Af Amer: 84 mL/min — ABNORMAL LOW (ref >90)
Glucose, Bld: 92 mg/dL (ref 70–99)
POTASSIUM: 3.5 meq/L — AB (ref 3.7–5.3)
SODIUM: 145 meq/L (ref 137–147)

## 2013-10-28 LAB — MPO/PR-3 (ANCA) ANTIBODIES
Myeloperoxidase Abs: 1.1 — ABNORMAL HIGH
Serine Protease 3: <1

## 2013-10-28 LAB — METHYLMALONIC ACID, SERUM: Methylmalonic Acid, Quantitative: 0.24 umol/L (ref <0.40)

## 2013-10-28 MED ORDER — POTASSIUM CHLORIDE ER 10 MEQ PO TBCR: 10.0000 meq | EXTENDED_RELEASE_TABLET | Freq: Every day | ORAL | Status: DC

## 2013-10-28 MED ORDER — VITAMIN D (ERGOCALCIFEROL) 1.25 MG (50000 UNIT) PO CAPS: 50000.0000 [IU] | ORAL_CAPSULE | ORAL | Status: DC

## 2013-10-28 MED ORDER — FOLIC ACID 1 MG PO TABS: ORAL_TABLET | ORAL | Status: DC

## 2013-10-28 MED ORDER — PREGABALIN 300 MG PO CAPS: 300.0000 mg | ORAL_CAPSULE | Freq: Two times a day (BID) | ORAL | Status: DC

## 2013-10-28 MED ORDER — FENTANYL 50 MCG/HR TD PT72
50.0000 ug | MEDICATED_PATCH | TRANSDERMAL | Status: DC
  Administered 2013-10-28: 50 ug via TRANSDERMAL
  Filled 2013-10-28: qty 1

## 2013-10-28 MED ORDER — FOLIC ACID 1 MG PO TABS: 1.0000 mg | ORAL_TABLET | Freq: Every day | ORAL | Status: DC

## 2013-10-28 MED ORDER — LEVETIRACETAM 500 MG PO TABS: 500.0000 mg | ORAL_TABLET | Freq: Two times a day (BID) | ORAL | Status: DC

## 2013-10-28 MED ORDER — VALACYCLOVIR HCL 1 G PO TABS: 1000.0000 mg | ORAL_TABLET | Freq: Two times a day (BID) | ORAL | Status: AC

## 2013-10-28 MED ORDER — B-12 2000 MCG PO TABS: 2000.0000 ug | ORAL_TABLET | Freq: Every day | ORAL | Status: DC

## 2013-10-28 MED ORDER — PREDNISONE 10 MG PO TABS: 60.0000 mg | ORAL_TABLET | ORAL | Status: DC

## 2013-10-28 MED ORDER — PREDNISONE 10 MG PO TABS: ORAL_TABLET | ORAL | Status: DC

## 2013-10-28 NOTE — Progress Notes (Signed)
Chaplain offered emotional support to pt and pt's family through compassionate and empathic conversation and listening.  Pt was excited about being discharged today.   10/28/13 1600  Clinical Encounter Type  Visited With Patient and family together  Visit Type Spiritual support    Rulon Abideavid B Sherrod, chaplain pager [phone]

## 2013-10-28 NOTE — Progress Notes (Signed)
  Date: 10/28/2013  Patient name: [name] [name]  Medical record number: [id number]  Date of birth: 10/31/1960   [name] patient has been seen and the plan of care was discussed with the house staff. Please see their note for complete details. I concur with their findings with the following additions/corrections:  Greatly appreciate Dr. Craige CottaSood and Dr. Shawnee KnappBeekman's help with [name] [name]  We will arrange for DC to home today with oral prednisone which I feel she should continue [facility] and by Pulmonary. She will also follow with Dr Dierdre ForthBeekman. Her Neurology recoreds [name] still do not help explain her current problems taht she presented with cardiopulmonary wise and do not explain her hilar lymphadenopathy or her claimed need for O2 at times in [name].   [name], MD 10/28/2013, 12:10 PM

## 2013-10-28 NOTE — Progress Notes (Signed)
Subjective:   Pt is feeling well this AM but is worried about her ability to be able to walk and get around at home.  She brought records with her from [place].   Objective:   Vital signs in last 24 hours: Filed Vitals:   10/27/13 1022 10/27/13 1405 10/27/13 2142 10/28/13 0530  BP: 109/68 102/68 104/68 93/60  Pulse: 71 62 61 62  Temp: 99.1 F (37.3 C) 98.9 F (37.2 C) 98.3 F (36.8 C) 98.3 F (36.8 C)  TempSrc: Oral Oral Oral Oral  Resp: '16 18 18 18  ' Height:      Weight:    127 lb 13.9 oz (58 kg)  SpO2: 99% 93% 98% 99%   Weight change: -1 lb 8.7 oz (-0.7 kg)  Intake/Output Summary (Last 24 hours) at 10/28/13 1435 Last data filed at 10/28/13 1055  Gross per 24 hour  Intake    222 ml  Output    650 ml  Net   -428 ml    Physical Exam: Constitutional: Vital signs reviewed.  Patient is in no acute distress and cooperative with exam.   Head: Normocephalic and atraumatic Eyes: EOMI, conjunctivae normal, no scleral icterus  Neck: Supple, Trachea midline Cardiovascular: RRR, S1, S2 present, no MRG, DP 2+ b/l Pulmonary/Chest: normal respiratory effort, CTAB, no wheezes, rales, or rhonchi Abdominal: Soft. +BS, Diffuse tenderness; non-distended Neurological: A&O x3; CNII-XII are grossly intact, moving all extremities  Skin: Warm, dry and intact.  Psychiatric: Normal mood and affect.   Lab Results:  BMP:  Recent Labs Lab 10/24/13 0240 10/25/13 0230  10/27/13 0905 10/28/13 0546  NA 143 140  < > 138 145  K 2.9* 3.9  < > 3.8 3.5*  CL 104 103  < > 103 108  CO2 23 22  < > 22 24  GLUCOSE 108* 61*  < > 109* 92  BUN 9 13  < > 6 8  CREATININE 0.66 0.83  < > 0.73 0.80  CALCIUM 8.2* 8.7  < > 8.3* 7.9*  MG 2.3 2.3  --   --   --   PHOS 2.7  --   --   --   --   < > = values in this interval not displayed.  CBC:  Recent Labs Lab 10/24/13 0240 10/26/13 0525 10/28/13 0546  WBC 15.7* 11.1* 9.4  NEUTROABS 12.2*  --   --   HGB 11.2* 10.3* 9.6*  HCT 35.1* 32.9*  31.3*  MCV 87.8 89.9 90.7  PLT 410* 355 367    Coagulation: No results found for this basename: LABPROT, INR,  in the last 168 hours  CBG:            Recent Labs Lab 10/26/13 1707 10/26/13 2152 10/27/13 0159 10/27/13 0407 10/27/13 0752 10/27/13 1215  GLUCAP 136* 105* 99 95 83 142*           HA1C:      No results found for this basename: HGBA1C,  in the last 168 hours  Lipid Panel: No results found for this basename: CHOL, HDL, LDLCALC, TRIG, CHOLHDL, LDLDIRECT,  in the last 168 hours  LFTs:  Recent Labs Lab 10/22/13 0234 10/24/13 0240  AST 37 26  ALT 86* 61*  ALKPHOS 91 87  BILITOT 0.4 0.5  PROT 5.9* 6.3  ALBUMIN 2.2* 2.6*    Pancreatic Enzymes: No results found for this basename: LIPASE, AMYLASE,  in the last 168 hours  Ammonia: No results found for this  basename: AMMONIA,  in the last 168 hours  Cardiac Enzymes:  Recent Labs Lab 10/25/13 0230  CKTOTAL 65  TROPONINI <0.30   Lab Results  Component Value Date   CKTOTAL 31 10/25/2013   TROPONINI <0.30 10/25/2013    EKG:  Date/Time:    Ventricular Rate:    PR Interval:    QRS Duration:   QT Interval:    QTC Calculation:   R Axis:     Text Interpretation:    BNP: No results found for this basename: PROBNP,  in the last 168 hours  D-Dimer: No results found for this basename: DDIMER,  in the last 168 hours  Urinalysis:  Recent Labs Lab 10/26/13 0836  COLORURINE RED*  LABSPEC 1.027  PHURINE 6.0  GLUCOSEU NEGATIVE  HGBUR LARGE*  BILIRUBINUR MODERATE*  KETONESUR 15*  PROTEINUR 100*  UROBILINOGEN 0.2  NITRITE NEGATIVE  LEUKOCYTESUR MODERATE*    Micro Results: Recent Results (from the past 240 hour(s))  CLOSTRIDIUM DIFFICILE BY PCR     Status: None   Collection Time    10/22/13 12:03 PM      Result Value Range Status   C difficile by pcr NEGATIVE  NEGATIVE Final  URINE CULTURE     Status: None   Collection Time    10/22/13  9:15 PM      Result Value Range Status   Specimen  Description URINE, CATHETERIZED   Final   Special Requests NONE   Final   Culture  Setup Time     Final   Value: 10/22/2013 22:16     Performed at [facility]     Final   Value: NO GROWTH     Performed at [facility]   Culture     Final   Value: NO GROWTH     Performed at [facility]   Report Status 10/23/2013 FINAL   Final  CULTURE, BLOOD (ROUTINE X 2)     Status: None   Collection Time    10/22/13  9:51 PM      Result Value Range Status   Specimen Description BLOOD LEFT ARM   Final   Special Requests BOTTLES DRAWN AEROBIC AND ANAEROBIC 10CC   Final   Culture  Setup Time     Final   Value: 10/23/2013 00:26     Performed at [facility]   Culture     Final   Value: STAPHYLOCOCCUS SPECIES (COAGULASE NEGATIVE)     Note: THE SIGNIFICANCE OF ISOLATING THIS ORGANISM FROM A SINGLE SET OF BLOOD CULTURES WHEN MULTIPLE SETS ARE DRAWN IS UNCERTAIN. PLEASE NOTIFY THE MICROBIOLOGY DEPARTMENT WITHIN ONE WEEK IF SPECIATION AND SENSITIVITIES ARE REQUIRED.     Note: Gram Stain Report Called to,Read Back By and Verified With: TINA SAVAGE ON 10/23/2013 AT 10:31P BY WILEJ     Performed at [facility]   Report Status 10/25/2013 FINAL   Final  CULTURE, BLOOD (ROUTINE X 2)     Status: None   Collection Time    10/22/13 11:10 PM      Result Value Range Status   Specimen Description BLOOD LEFT FINGER   Final   Special Requests BOTTLES DRAWN AEROBIC ONLY 2CC FROM LEFT THUMB   Final   Culture  Setup Time     Final   Value: 10/23/2013 08:45     Performed at [facility]   Culture     Final   Value:  BLOOD CULTURE RECEIVED NO GROWTH TO DATE CULTURE WILL BE HELD FOR 5 DAYS BEFORE ISSUING A FINAL NEGATIVE REPORT     Performed at [facility]   Report Status PENDING   Incomplete  WET PREP, GENITAL     Status: Abnormal   Collection Time    10/23/13  7:16 PM      Result Value Range Status   Yeast Wet Prep HPF POC NONE SEEN   NONE SEEN Final   Trich, Wet Prep NONE SEEN  NONE SEEN Final   Clue Cells Wet Prep HPF POC NONE SEEN  NONE SEEN Final   WBC, Wet Prep HPF POC MODERATE (*) NONE SEEN Final  CULTURE, BLOOD (ROUTINE X 2)     Status: None   Collection Time    10/24/13  2:40 AM      Result Value Range Status   Specimen Description BLOOD RIGHT ARM   Final   Special Requests BOTTLES DRAWN AEROBIC ONLY 10CC   Final   Culture  Setup Time     Final   Value: 10/24/2013 08:30     Performed at [facility]   Culture     Final   Value:        BLOOD CULTURE RECEIVED NO GROWTH TO DATE CULTURE WILL BE HELD FOR 5 DAYS BEFORE ISSUING A FINAL NEGATIVE REPORT     Performed at [facility]   Report Status PENDING   Incomplete  CULTURE, BLOOD (ROUTINE X 2)     Status: None   Collection Time    10/24/13  2:50 AM      Result Value Range Status   Specimen Description BLOOD RIGHT HAND   Final   Special Requests     Final   Value: BOTTLES DRAWN AEROBIC AND ANAEROBIC 10CC BLUE,7CC RED   Culture  Setup Time     Final   Value: 10/24/2013 08:30     Performed at [facility]   Culture     Final   Value:        BLOOD CULTURE RECEIVED NO GROWTH TO DATE CULTURE WILL BE HELD FOR 5 DAYS BEFORE ISSUING A FINAL NEGATIVE REPORT     Performed at [facility]   Report Status PENDING   Incomplete  URINE CULTURE     Status: None   Collection Time    10/26/13  8:36 AM      Result Value Range Status   Specimen Description URINE, RANDOM   Final   Special Requests NONE   Final   Culture  Setup Time     Final   Value: 10/26/2013 18:01     Performed at [facility]     Final   Value: NO GROWTH     Performed at [facility]   Culture     Final   Value: NO GROWTH     Performed at [facility]   Report Status 10/27/2013 FINAL   Final    Blood Culture:    Component Value Date/Time   SDES URINE, RANDOM 10/26/2013 0836   SPECREQUEST NONE 10/26/2013 0836   CULT  Value:  NO GROWTH Performed at [facility] 10/26/2013 0836   REPTSTATUS 10/27/2013 FINAL 10/26/2013 0836    Studies/Results: No results found.  Medications:  Scheduled Meds: . cyanocobalamin  1,000 mcg Intramuscular Daily  . enoxaparin (LOVENOX) injection  30 mg Subcutaneous Q24H  . fentaNYL  50 mcg  Transdermal Q72H  . folic acid  1 mg Per Tube Daily  . levETIRAcetam  500 mg Oral BID  . pantoprazole  40 mg Oral QHS  . predniSONE  60 mg Oral Q breakfast  . pregabalin  300 mg Oral BID  . ramelteon  8 mg Oral QHS  . tamsulosin  0.4 mg Oral Daily  . valACYclovir  1,000 mg Oral BID   Continuous Infusions: . dextrose 1,000 mL (10/26/13 1402)   PRN Meds:.docusate sodium, ondansetron (ZOFRAN) IV, polyethylene glycol, sodium chloride  Antibiotics: Anti-infectives   Start     Dose/Rate Route Frequency Ordered Stop   10/28/13 0000  valACYclovir (VALTREX) 1000 MG tablet     1,000 mg Oral 2 times daily 10/28/13 1215 11/03/13 2359   10/24/13 1400  valACYclovir (VALTREX) tablet 1,000 mg     1,000 mg Oral 2 times daily 10/24/13 1255 11/03/13 0959   10/23/13 2200  acyclovir (ZOVIRAX) tablet 400 mg  Status:  Discontinued     400 mg Oral 3 times daily 10/23/13 1918 10/24/13 1253   10/17/13 1100  cefTRIAXone (ROCEPHIN) 1 g in dextrose 5 % 50 mL IVPB     1 g 100 mL/hr over 30 Minutes Intravenous Every 24 hours 10/17/13 1020 10/21/13 1130   10/15/13 1300  oseltamivir (TAMIFLU) 6 MG/ML suspension 75 mg  Status:  Discontinued     75 mg Oral 2 times daily 10/15/13 1252 10/18/13 0932   10/15/13 1245  oseltamivir (TAMIFLU) capsule 75 mg  Status:  Discontinued     75 mg Oral 2 times daily 10/15/13 1227 10/15/13 1252   10/15/13 0730  vancomycin (VANCOCIN) IVPB 750 mg/150 ml premix  Status:  Discontinued     750 mg 150 mL/hr over 60 Minutes Intravenous Every 12 hours 10/14/13 2031 10/17/13 1020   10/15/13 0000  azithromycin (ZITHROMAX) 500 mg in dextrose 5 % 250 mL IVPB  Status:  Discontinued     500  mg 250 mL/hr over 60 Minutes Intravenous Every 24 hours 10/14/13 2351 10/16/13 0847   10/14/13 1930  piperacillin-tazobactam (ZOSYN) IVPB 3.375 g  Status:  Discontinued     3.375 g 12.5 mL/hr over 240 Minutes Intravenous 3 times per day 10/14/13 1855 10/17/13 1020   10/14/13 1900  vancomycin (VANCOCIN) IVPB 1000 mg/200 mL premix     1,000 mg 200 mL/hr over 60 Minutes Intravenous  Once 10/14/13 1855 10/14/13 2030   10/14/13 1845  oseltamivir (TAMIFLU) capsule 75 mg     75 mg Oral  Once 10/14/13 1833 10/14/13 1851     Antibiotics Given (last 72 hours)   Date/Time Action Medication Dose   10/25/13 2116 Given   valACYclovir (VALTREX) tablet 1,000 mg 1,000 mg   10/26/13 1000 Given   valACYclovir (VALTREX) tablet 1,000 mg 1,000 mg   10/26/13 2259 Given   valACYclovir (VALTREX) tablet 1,000 mg 1,000 mg   10/27/13 1052 Given   valACYclovir (VALTREX) tablet 1,000 mg 1,000 mg   10/27/13 2158 Given   valACYclovir (VALTREX) tablet 1,000 mg 1,000 mg   10/28/13 8828 Given   valACYclovir (VALTREX) tablet 1,000 mg 1,000 mg      Day of Hospitalization:  14 Consults: Treatment Team:  Durward Parcel, MD  Assessment/Plan:   #?Autoimmune Disease - p-ANCA positive; myeloperoxidase 1.2 (H); ESR 98 (but would expect this to be high in acute illness and is non-specific); ANA positive; c3/c4 negative; RF 13 (wnl); anti-DNA 1 (negative); CRP 23.8 (H); cryoglobulin negative; TSH/T4 low; ANA titer  1:80; p-ANCA titer 1:20 (nl=<1:20).  ACE level wnl.  -consulted Rheumatology will arrange f/u as outpatient -further workup as indicated; however, pt has much improved and further workup may be completed as an outpatient  -d/c home on prednisone taper  #Acute Delirium - Resolved.  Pt recent transfer from ICU to IMTS.   Pt is able to carry on a normal conversation with [place] today.  We discontinued all of her psychotropic medications and put her back on her regular home meds.  Most likely etiology of  acute delirium was combination of medications including precedex.  MRI was not revealing of any acute infarct or mass with normal enhancement.  Patchy subcortical hyperintensities in the left parietal white matter. These are nonspecific and could be seen with chronic microvascular ischemia. They do not appear to be acute and do not  enhance.   #Severe sepsis secondary to CAP - Resolved.  Pt currently afebrile.  Pt with leukocytosis but is on prednisone 71m.  1/2 BCx did grow gpc in clusters but likely contaminant and pt does not look septic.    #Chronic Pain - Pt complains mainly of back pain that is chronic and states it is due to DDD.  CT Abd did not show any acute process. Vit D level low-will replete.  -continue lyrica 3032mbid -continue fentanyl patch q2h PRN  #Hypokalemia - Mg/phos wnl.  -replete as needed -d/c with potassium supplementation  #Seizure disorder - No seizures while admitted.  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 -continue HH PT   #Anemia of chronic disease - Pt also with folate deficiency and probable B12.  Ferritin was high but this was check in acute illness and therefore may be falsely elevated.  Retic count wnl. Pt reports a FH of anemia and states her mother needed B12 supplementation.  -continue folate supplementation -2000mc2m2 daily -MMA pending  #Dispo- Disposition is deferred at this time, awaiting improvement of current medical problems.  Anticipated discharge in approximately 1-2 day(s).    LOS: 14 days   [name] JewelsPGY-1, Internal Medicine  [phone] Mon-Fri) 10/28/2013, 2:35 PM

## 2013-10-28 NOTE — Progress Notes (Signed)
PULMONOLOGY/CRITICAL CARE   Name: [name] [name] MRN: [id number] DOB: Nov 01, 1960    ADMISSION DATE:  10/14/2013 CONSULTATION DATE: 10/14/13  REFERRING MD :  IM PRIMARY SERVICE: IM  CHIEF COMPLAINT:  fever  BRIEF PATIENT DESCRIPTION: 53 yo female with recent admission for CAP re-presents with fever, leukocytosis, chest pain, and hypoxia in setting of possible seizure vs pre-syncope.  SIGNIFICANT EVENTS / STUDIES:  1/28 Abd [place] >>No hydronephrosis, Left kidney 44m stone?, Right kidney 611mstone? Mild intra and extrahepatic biliary ductal prominence,  1/28 CTA chest >>> No PE.  Bilateral prominently upper lobe mixed airspace and ground-glass opacities, multi lobar pneumonia is favored.  Mediastinal and bilateral hilar adenopathy  1/28 Bronchoscopy >>> unimpressive secretions, no DAH, no hsv lesions, no mass lesions, BAL sent. 1/28 TTE >>> EF 65-70%, grade 1 DD, PA peak 38, no change from prior 1/28 CT head >>> Negative for acute process. 1/29 EEG >>> no evidence of seizure activity 1/31 2. 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  LINES / TUBES: 1/28 LIJ >>> 2/1 (patient removed) 1/28 OETT >>>2/1 1/28 Foley >> 1/29 A line >>>out  CULTURES: 1/18 Blood cultures >> Neg 1/27 UCx >>>> Neg 1/27 BCx >>>> neg 1/28 Strep culture >>>neg 1/28 MRSA >>> neg 1/28 Pneumocystis smear (BAL) >>> neg 1/28 AFB (BAL) >>>> smear neg, culture pending 1/28 Resp Culture (BAL) >>> NGTD 1/28 Fungus Culture (BAL) >>> No yeast or fungal elements seen, culture in process 1/28 GAS (BAL) >>>>neg 1/28 Respiratory virus panel (BAL) >>>neg HIV neg  AUTOIMMUNE: Collected 1/28  p-ANCA>>>pos  c-ANCA >>> neg  Atypical p-ANCA>>>neg  Myeloperoxidase Abx >>> 1.2 (pos) Serine Protease 3 >>> <1.0 (neg) ESR >>> 98 ANA >>> pos C3 >>> 108 C4 >>>  22 RF >>> 13 Total Complement >>>56 dsDNA Ab >>> 1 CRP >>> 23.8  Cryoglobulin >>>none detected  ANTIBIOTICS: Home on levofloxacin 1/27  Vanc >>>1/30 1/27 Zosyn >>>1/30 1/30 ceftriaxone>>>2/3  1/27 Azithromycin >>>1/29 1/28 tamiflu stopped 1/31  VITAL SIGNS: Temp:  [98.3 F (36.8 C)-99.1 F (37.3 C)] 98.3 F (36.8 C) (02/10 0530) Pulse Rate:  [61-71] 62 (02/10 0530) Resp:  [16-18] 18 (02/10 0530) BP: (93-109)/(60-68) 93/60 mmHg (02/10 0530) SpO2:  [93 %-99 %] 99 % (02/10 0530) Weight:  [127 lb 13.9 oz (58 kg)] 127 lb 13.9 oz (58 kg) (02/10 0530)    INTAKE / OUTPUT: Intake/Output     02/09 0701 - 02/10 0700 02/10 0701 - 02/11 0700   P.O. 927    I.V. (mL/kg)     IV Piggyback     Total Intake(mL/kg) 927 (16)    Urine (mL/kg/hr) 650 (0.5)    Total Output 650     Net +277          Urine Occurrence 1 x     SUBJECTIVE:  Comfortable, no complaints at this time.  PHYSICAL EXAMINATION: General: comfortable. Ambulated with walker. Complains of leg pain from walking. HEENT: NCAT Cardiac: rrr, no rubs, murmurs or gallops Pulm: clear breath sounds, no wheezing Abd: soft, nontender, nondistended, hypoactive bowel sounds Ext: warm and well perfused Neuro: MAE x 4, oriented to person   LABS:  CBC  Recent Labs Lab 10/24/13 0240 10/26/13 0525 10/28/13 0546  WBC 15.7* 11.1* 9.4  HGB 11.2* 10.3* 9.6*  HCT 35.1* 32.9* 31.3*  PLT 410* 355 367  BMET  Recent Labs Lab 10/26/13 0525 10/27/13 0905 10/28/13 0546  NA 139 138 145  K 3.4* 3.8 3.5*  CL 103 103 108  CO2 '23 22 24  ' BUN '11 6 8  ' CREATININE 0.80 0.73 0.80  GLUCOSE 103* 109* 92    ASSESSMENT / PLAN:  Diffuse GGO infiltrates on admission - improved with steroids and diuresis; ? Of infection, but cx negative. Mediastinal and bilateral hilar adenopathy - as noted from Chest CTA 1/28. Plan: -wean prednisone as tolerated >> can try to decrease to 40 mg over next two weeks, and then re-assess as outpt -she  will have rheumatology follow up with Dr. Amil Amen scheduled by Graciella Freer -she is scheduled for follow up with Dr. Chase Caller on Friday 2/20 and 2:30 PM for pulmonary follow up  [place] Pager [phone] till 3 pm If no answer page [phone] 10/28/2013, 9:11 AM  Reviewed above, and agree.  She had diffuse GGO infiltrates on admission.  She was treated with steroids, diuresis, and antibiotics.  She has marked clinical improvement.  She reports intermittently needing oxygen when living in [place] >> she is not sure why.  She has very mild elevation in myeloperoxidase and ANA >> I do not think these are clinically significant.  Recommend slowly wean prednisone as tolerated.  She has pulmonary follow up schedule from 11/07/13.  She will get rheumatology follow up scheduled by IMTS.  She is otherwise stable from pulmonary standpoint for discharge home.  Chesley Mires, MD [facility] Pulmonary/Critical Care 10/28/2013, 11:29 AM Pager:  [phone] After 3pm call: [phone]

## 2013-10-28 NOTE — Progress Notes (Signed)
Occupational Therapy Treatment Patient Details Name: [name] [name] Nipper MRN: [id number] DOB: 04/08/1961 Today's Date: 10/28/2013 Time: [id number] OT Time Calculation (min): 25 min  OT Assessment / Plan / Recommendation  History of present illness 53 yo female with recent admission for CAP re-presents with fever, leukocytosis, chest pain, and hypoxia in setting of possible seizure vs pre-syncope. PMHx fibromyalgia with chronic pain   OT comments  Pt making progress with functional goal and should continue with acute OT services to increase level of function and safety  Follow Up Recommendations  Home health OT;Supervision/Assistance - 24 hour    Barriers to Discharge   none    Equipment Recommendations  3 in 1 bedside comode;Tub/shower seat    Recommendations for Other Services    Frequency Min 2X/week   Progress towards OT Goals Progress towards OT goals: Progressing toward goals  Plan Discharge plan remains appropriate    Precautions / Restrictions Precautions Precautions: Fall Restrictions Weight Bearing Restrictions: No   Pertinent Vitals/Pain No c/o pain    ADL  Grooming: Wash/dry hands;Supervision/safety;Wash/dry face Where Assessed - Grooming: Unsupported standing Upper Body Bathing: Simulated;Modified independent Where Assessed - Upper Body Bathing: Unsupported sitting Lower Body Bathing: Simulated;Supervision/safety;Set up;Min guard Where Assessed - Lower Body Bathing: Unsupported sit to stand Upper Body Dressing: Performed;Modified independent Where Assessed - Upper Body Dressing: Unsupported sitting Lower Body Dressing: Performed;Supervision/safety;Set up;Min guard Where Assessed - Lower Body Dressing: Unsupported sit to stand Toilet Transfer: Performed;Supervision/safety;Min guard Toilet Transfer Method: Sit to stand Toilet Transfer Equipment: Grab bars;Regular height toilet Toileting - Clothing Manipulation and Hygiene: Supervision/safety Where Assessed - International aid/development workerToileting  Clothing Manipulation and Hygiene: Standing Tub/Shower Transfer: Armed forces technical officerimulated;Min guard;Supervision/safety Tub/Shower Transfer Equipment: Walk in shower;Grab bars;Shower seat without back Equipment Used: Rolling walker;Gait belt;Other (comment) (3 in 1) Transfers/Ambulation Related to ADLs: cues for safety using RW. Pt leaving RW off to the side once she got to bathroom door to enter bathroom. Pt fatigues easily and begins to require min guard A for safety ADL Comments: Pt fatigues easily and begins to require min guard A for safety    OT Diagnosis:    OT Problem List:   OT Treatment Interventions:     OT Goals(current goals can now be found in the care plan section) Acute Rehab OT Goals Patient Stated Goal: to get better  Visit Information  Last OT Received On: 10/28/13 Assistance Needed: +1 History of Present Illness: 53 yo female with recent admission for CAP re-presents with fever, leukocytosis, chest pain, and hypoxia in setting of possible seizure vs pre-syncope. PMHx fibromyalgia with chronic pain    Subjective Data      Prior Functioning       Cognition  Cognition Arousal/Alertness: Awake/alert Behavior During Therapy: WFL for tasks assessed/performed Overall Cognitive Status: Within Functional Limits for tasks assessed    Mobility  Bed Mobility Overal bed mobility: Needs Assistance Bed Mobility: Supine to Sit Supine to sit: Supervision Transfers Overall transfer level: Needs assistance Equipment used: Rolling walker (2 wheeled) Transfers: Sit to/from Stand Sit to Stand: Min guard General transfer comment: cues for safety using RW. Pt leaving RW off to the side once she got to bathroom door to enter bathroom          Balance Balance Sitting-balance support: No upper extremity supported;Feet supported Sitting balance-Leahy Scale: Good Standing balance support: No upper extremity supported;During functional activity;Single extremity supported Standing  balance-Leahy Scale: Good Standing balance comment: fatigues easily and requires min guard A for safety  End of Session OT -  End of Session Equipment Utilized During Treatment: Rolling walker Activity Tolerance: Patient tolerated treatment well Patient left: in chair;with call bell/phone within reach;with chair alarm set  GO     Galen Manila 10/28/2013, 1:27 PM

## 2013-10-28 NOTE — Progress Notes (Signed)
NURSING PROGRESS NOTE  [name] [name] Lavis [id number] Discharge Data: 10/28/2013 3:00 PM Attending Provider: [name], MD PCP:No PCP Per Patient     [name] CaulNieves Dipasquale to be D/C'd Home per MD order.  Discussed with the patient the After Visit Summary and all questions fully answered. All IV's discontinued with no bleeding noted. All belongings returned to patient for patient to take home.   Last Vital Signs:  Blood pressure 93/60, pulse 62, temperature 98.3 F (36.8 C), temperature source Oral, resp. rate 18, height 5\' 1"  (1.549 m), weight 58 kg (127 lb 13.9 oz), SpO2 99.00%.  Discharge Medication List   Medication List    STOP taking these medications       DSS 100 MG Caps     Oxcarbazepine 300 MG tablet  Commonly known as:  TRILEPTAL      TAKE these medications       B-12 2000 MCG Tabs  Take 2,000 mcg by mouth daily.     etodolac 500 MG tablet  Commonly known as:  LODINE  Take 500 mg by mouth 2 (two) times daily.     fentaNYL 50 MCG/HR  Commonly known as:  DURAGESIC - dosed mcg/hr  Place 1 patch (50 mcg total) onto the skin every 3 (three) days.     folic acid 1 MG tablet  Commonly known as:  FOLVITE  Place 1 tablet (1 mg total) into feeding tube daily.     levETIRAcetam 500 MG tablet  Commonly known as:  KEPPRA  Take 1 tablet (500 mg total) by mouth 2 (two) times daily.     pantoprazole 40 MG tablet  Commonly known as:  PROTONIX  Take 40 mg by mouth 2 (two) times daily.     polyethylene glycol packet  Commonly known as:  MIRALAX / GLYCOLAX  Take 17 g by mouth daily as needed for mild constipation or moderate constipation.     predniSONE 10 MG tablet  Commonly known as:  DELTASONE  - Take 40 mg daily with breakfast for 3 days (10/29/2013 - 10/31/2013), then   - Take 30 mg daily with breakfast for 3 days (2/14 -2/16), then   - Take 20 mg daily with breakfast for 3 days (2/17-2/19) Then   - Take 10 mg daily with breakfast for 3 days (2/10-2/20) and stop.     pregabalin 300 MG capsule  Commonly known as:  LYRICA  Take 1 capsule (300 mg total) by mouth 2 (two) times daily.     rizatriptan 10 MG tablet  Commonly known as:  MAXALT  Take 10 mg by mouth daily as needed for migraine. May repeat in 2 hours if needed     traMADol 50 MG tablet  Commonly known as:  ULTRAM  Take 1 tablet (50 mg total) by mouth every 12 (twelve) hours as needed for moderate pain.     valACYclovir 1000 MG tablet  Commonly known as:  VALTREX  Take 1 tablet (1,000 mg total) by mouth 2 (two) times daily.     Vitamin D (Ergocalciferol) 50000 UNITS Caps capsule  Commonly known as:  DRISDOL  Take 1 capsule (50,000 Units total) by mouth every 7 (seven) days.

## 2013-10-28 NOTE — Progress Notes (Signed)
Physical Therapy Treatment Patient Details Name: [name] [name] MRN: [id number] DOB: March 23, 1961 Today's Date: 10/28/2013 Time: 5035-4656 PT Time Calculation (min): 15 min  PT Assessment / Plan / Recommendation  History of Present Illness 53 yo female with recent admission for CAP re-presents with fever, leukocytosis, chest pain, and hypoxia in setting of possible seizure vs pre-syncope. PMHx fibromyalgia with chronic pain   PT Comments   Pt making good progress.  Follow Up Recommendations  Home health PT;Supervision/Assistance - 24 hour     Does the patient have the potential to tolerate intense rehabilitation     Barriers to Discharge        Equipment Recommendations  None recommended by PT    Recommendations for Other Services    Frequency Min 3X/week   Progress towards PT Goals Progress towards PT goals: Goals met and updated - see care plan  Plan Current plan remains appropriate    Precautions / Restrictions Precautions Precautions: Fall Restrictions Weight Bearing Restrictions: No   Pertinent Vitals/Pain No c/o's    Mobility  Bed Mobility Overal bed mobility: Needs Assistance Bed Mobility: Supine to Sit Supine to sit: Modified independent (Device/Increase time) Sit to supine: Modified independent (Device/Increase time) Transfers Overall transfer level: Needs assistance Equipment used: Rolling walker (2 wheeled) Transfers: Sit to/from Stand Sit to Stand: Supervision General transfer comment: Cues for hand placement. Ambulation/Gait Ambulation/Gait assistance: Supervision Ambulation Distance (Feet): 80 Feet (x 2) Assistive device: Rolling walker (2 wheeled) Gait Pattern/deviations: Step-through pattern;Decreased step length - right;Decreased step length - left;Narrow base of support Gait velocity interpretation: Below normal speed for age/gender General Gait Details: Pt with some knee flexion in stance phase during gait as distance increases but pt able to  continue.    Exercises     PT Diagnosis:    PT Problem List:   PT Treatment Interventions:     PT Goals (current goals can now be found in the care plan section) Acute Rehab PT Goals Patient Stated Goal: to get better PT Goal Formulation: With patient Time For Goal Achievement: 11/04/13 Potential to Achieve Goals: Good  Visit Information  Last PT Received On: 10/28/13 Assistance Needed: +1 History of Present Illness: 53 yo female with recent admission for CAP re-presents with fever, leukocytosis, chest pain, and hypoxia in setting of possible seizure vs pre-syncope. PMHx fibromyalgia with chronic pain    Subjective Data  Patient Stated Goal: to get better   Cognition  Cognition Arousal/Alertness: Awake/alert Behavior During Therapy: WFL for tasks assessed/performed Overall Cognitive Status: Impaired/Different from baseline Memory: Decreased short-term memory General Comments: Requires verbal cues for hand placement on each transfer.    Balance  Balance Sitting-balance support: No upper extremity supported;Feet supported Sitting balance-Leahy Scale: Good   End of Session PT - End of Session Equipment Utilized During Treatment: Gait belt Activity Tolerance: Patient tolerated treatment well Patient left: in bed;with call bell/phone within reach;with bed alarm set Nurse Communication: Mobility status   GP     Dayne Dekay 10/28/2013, 2:16 PM  [facility] PT [phone]

## 2013-10-29 LAB — ANCA SCREEN W REFLEX TITER
ATYPICAL P-ANCA SCREEN: NEGATIVE
P-ANCA SCREEN: POSITIVE — AB
c-ANCA Screen: NEGATIVE

## 2013-10-29 LAB — CULTURE, BLOOD (ROUTINE X 2): Culture: NO GROWTH

## 2013-10-29 LAB — ANCA TITERS: P-ANCA: 1:20 {titer} — ABNORMAL HIGH

## 2013-10-29 NOTE — Discharge Summary (Signed)
  Date: 10/29/2013  Patient name: [name] [name]  Medical record number: [id number]  Date of birth: 04/21/1961   This patient has been seen and the plan of care was discussed with the house staff. Please see their note for complete details. I concur with their findings with the following additions/corrections:  See my addendum from daily progress note. Greatly appreciate CCM and Dr Shawnee KnappBeekman's help with this pt.  [name], MD 10/29/2013, 5:38 PM

## 2013-10-30 ENCOUNTER — Inpatient Hospital Stay: Payer: Self-pay | Admitting: Internal Medicine

## 2013-10-30 LAB — CULTURE, BLOOD (ROUTINE X 2)
Culture: NO GROWTH
Culture: NO GROWTH

## 2013-10-31 ENCOUNTER — Telehealth: Payer: Self-pay | Admitting: *Deleted

## 2013-10-31 NOTE — Telephone Encounter (Signed)
Pt called and left message - needs refill on Fentanyl patch - needs to be changed 11/01/13. Has appt with Dr Judie PetitM. Aundria Rudogers 11/04/13. Talked with Dr Delane GingerGill prefers for pt to wait till appt for pain med. [facility] Dr Delane GingerGill suggest may try to call doctor who gave her Rx for Fentanyl patch. Called [phone] a female states pt was sleeping - to inform pt to call clinic - left message  at 3:15PM. Stanton KidneyDebra Tocarra Gassen RN 10/31/13 3:18PM

## 2013-10-31 NOTE — Telephone Encounter (Signed)
Pt called the clinic 4:15PM and discuss Fentanyl patch. Aware Dr Delane GingerGill and Dr Judie PetitM. Rogers not comfortable refilling med.  Pt did not want to call neurology in [name]. Offered appt  11/03/13 - pt prefers to wait till appt 11/04/13. Stanton KidneyDebra Livvy Spilman RN  11/03/13 4:20PM

## 2013-11-02 ENCOUNTER — Inpatient Hospital Stay (HOSPITAL_COMMUNITY)
Admission: EM | Admit: 2013-11-02 | Discharge: 2013-11-06 | DRG: 871 | Disposition: A | Discharge status: Home / Self Care | Payer: Medicaid Other | Attending: Internal Medicine | Admitting: Internal Medicine

## 2013-11-02 ENCOUNTER — Inpatient Hospital Stay (HOSPITAL_COMMUNITY): Payer: Medicaid Other

## 2013-11-02 ENCOUNTER — Encounter (HOSPITAL_COMMUNITY): Payer: Self-pay | Admitting: Emergency Medicine

## 2013-11-02 ENCOUNTER — Emergency Department (HOSPITAL_COMMUNITY): Payer: Medicaid Other

## 2013-11-02 DIAGNOSIS — R4182 Altered mental status, unspecified: Secondary | ICD-10-CM

## 2013-11-02 DIAGNOSIS — J45909 Unspecified asthma, uncomplicated: Secondary | ICD-10-CM | POA: Diagnosis present

## 2013-11-02 DIAGNOSIS — L52 Erythema nodosum: Secondary | ICD-10-CM | POA: Diagnosis present

## 2013-11-02 DIAGNOSIS — G40909 Epilepsy, unspecified, not intractable, without status epilepticus: Secondary | ICD-10-CM

## 2013-11-02 DIAGNOSIS — K219 Gastro-esophageal reflux disease without esophagitis: Secondary | ICD-10-CM | POA: Diagnosis present

## 2013-11-02 DIAGNOSIS — J9600 Acute respiratory failure, unspecified whether with hypoxia or hypercapnia: Secondary | ICD-10-CM | POA: Diagnosis present

## 2013-11-02 DIAGNOSIS — E559 Vitamin D deficiency, unspecified: Secondary | ICD-10-CM | POA: Diagnosis present

## 2013-11-02 DIAGNOSIS — J13 Pneumonia due to Streptococcus pneumoniae: Secondary | ICD-10-CM | POA: Diagnosis present

## 2013-11-02 DIAGNOSIS — B009 Herpesviral infection, unspecified: Secondary | ICD-10-CM | POA: Diagnosis present

## 2013-11-02 DIAGNOSIS — J189 Pneumonia, unspecified organism: Secondary | ICD-10-CM

## 2013-11-02 DIAGNOSIS — E139 Other specified diabetes mellitus without complications: Secondary | ICD-10-CM | POA: Diagnosis not present

## 2013-11-02 DIAGNOSIS — J8489 Other specified interstitial pulmonary diseases: Secondary | ICD-10-CM

## 2013-11-02 DIAGNOSIS — E876 Hypokalemia: Secondary | ICD-10-CM

## 2013-11-02 DIAGNOSIS — E873 Alkalosis: Secondary | ICD-10-CM | POA: Diagnosis present

## 2013-11-02 DIAGNOSIS — R0902 Hypoxemia: Secondary | ICD-10-CM

## 2013-11-02 DIAGNOSIS — R0609 Other forms of dyspnea: Secondary | ICD-10-CM | POA: Diagnosis present

## 2013-11-02 DIAGNOSIS — R0989 Other specified symptoms and signs involving the circulatory and respiratory systems: Secondary | ICD-10-CM | POA: Diagnosis present

## 2013-11-02 DIAGNOSIS — R109 Unspecified abdominal pain: Secondary | ICD-10-CM

## 2013-11-02 DIAGNOSIS — A419 Sepsis, unspecified organism: Secondary | ICD-10-CM | POA: Diagnosis present

## 2013-11-02 DIAGNOSIS — M797 Fibromyalgia: Secondary | ICD-10-CM

## 2013-11-02 DIAGNOSIS — J984 Other disorders of lung: Secondary | ICD-10-CM

## 2013-11-02 DIAGNOSIS — A6000 Herpesviral infection of urogenital system, unspecified: Secondary | ICD-10-CM

## 2013-11-02 DIAGNOSIS — IMO0001 Reserved for inherently not codable concepts without codable children: Secondary | ICD-10-CM | POA: Diagnosis present

## 2013-11-02 DIAGNOSIS — I498 Other specified cardiac arrhythmias: Secondary | ICD-10-CM | POA: Diagnosis not present

## 2013-11-02 DIAGNOSIS — M359 Systemic involvement of connective tissue, unspecified: Secondary | ICD-10-CM | POA: Diagnosis present

## 2013-11-02 DIAGNOSIS — R319 Hematuria, unspecified: Secondary | ICD-10-CM | POA: Diagnosis present

## 2013-11-02 DIAGNOSIS — J9601 Acute respiratory failure with hypoxia: Secondary | ICD-10-CM

## 2013-11-02 DIAGNOSIS — D638 Anemia in other chronic diseases classified elsewhere: Secondary | ICD-10-CM | POA: Diagnosis present

## 2013-11-02 DIAGNOSIS — T380X5A Adverse effect of glucocorticoids and synthetic analogues, initial encounter: Secondary | ICD-10-CM | POA: Diagnosis not present

## 2013-11-02 DIAGNOSIS — J029 Acute pharyngitis, unspecified: Secondary | ICD-10-CM

## 2013-11-02 DIAGNOSIS — D649 Anemia, unspecified: Secondary | ICD-10-CM

## 2013-11-02 DIAGNOSIS — G894 Chronic pain syndrome: Secondary | ICD-10-CM | POA: Diagnosis present

## 2013-11-02 DIAGNOSIS — R6520 Severe sepsis without septic shock: Secondary | ICD-10-CM

## 2013-11-02 DIAGNOSIS — G8929 Other chronic pain: Secondary | ICD-10-CM

## 2013-11-02 DIAGNOSIS — R079 Chest pain, unspecified: Secondary | ICD-10-CM

## 2013-11-02 DIAGNOSIS — J8410 Pulmonary fibrosis, unspecified: Secondary | ICD-10-CM | POA: Diagnosis present

## 2013-11-02 DIAGNOSIS — Z79899 Other long term (current) drug therapy: Secondary | ICD-10-CM | POA: Diagnosis not present

## 2013-11-02 DIAGNOSIS — D72829 Elevated white blood cell count, unspecified: Secondary | ICD-10-CM

## 2013-11-02 DIAGNOSIS — J849 Interstitial pulmonary disease, unspecified: Secondary | ICD-10-CM

## 2013-11-02 LAB — MRSA PCR SCREENING: MRSA by PCR: NEGATIVE

## 2013-11-02 LAB — POCT I-STAT 3, ART BLOOD GAS (G3+)
Acid-Base Excess: 3 mmol/L — ABNORMAL HIGH (ref 0.0–2.0)
BICARBONATE: 25.1 meq/L — AB (ref 20.0–24.0)
O2 Saturation: 92 %
PH ART: 7.519 — AB (ref 7.350–7.450)
TCO2: 26 mmol/L (ref 0–100)
pCO2 arterial: 30.8 mmHg — ABNORMAL LOW (ref 35.0–45.0)
pO2, Arterial: 57 mmHg — ABNORMAL LOW (ref 80.0–100.0)

## 2013-11-02 LAB — COMPREHENSIVE METABOLIC PANEL
ALK PHOS: 143 U/L — AB (ref 39–117)
ALT: 31 U/L (ref 0–35)
AST: 25 U/L (ref 0–37)
Albumin: 2.5 g/dL — ABNORMAL LOW (ref 3.5–5.2)
BILIRUBIN TOTAL: 0.6 mg/dL (ref 0.3–1.2)
BUN: 13 mg/dL (ref 6–23)
CALCIUM: 9 mg/dL (ref 8.4–10.5)
CO2: 24 mEq/L (ref 19–32)
Chloride: 98 mEq/L (ref 96–112)
Creatinine, Ser: 0.67 mg/dL (ref 0.50–1.10)
GLUCOSE: 112 mg/dL — AB (ref 70–99)
Potassium: 3.5 mEq/L — ABNORMAL LOW (ref 3.7–5.3)
Sodium: 139 mEq/L (ref 137–147)
Total Protein: 7 g/dL (ref 6.0–8.3)

## 2013-11-02 LAB — ANGIOTENSIN CONVERTING ENZYME: Angiotensin-Converting Enzyme: 4 U/L — ABNORMAL LOW (ref 8–52)

## 2013-11-02 LAB — CBC WITH DIFFERENTIAL/PLATELET
BASOS ABS: 0 10*3/uL (ref 0.0–0.1)
Basophils Relative: 0 % (ref 0–1)
Eosinophils Absolute: 0 10*3/uL (ref 0.0–0.7)
Eosinophils Relative: 0 % (ref 0–5)
HCT: 33.6 % — ABNORMAL LOW (ref 36.0–46.0)
Hemoglobin: 10.8 g/dL — ABNORMAL LOW (ref 12.0–15.0)
LYMPHS ABS: 1 10*3/uL (ref 0.7–4.0)
LYMPHS PCT: 3 % — AB (ref 12–46)
MCH: 28.6 pg (ref 26.0–34.0)
MCHC: 32.1 g/dL (ref 30.0–36.0)
MCV: 89.1 fL (ref 78.0–100.0)
Monocytes Absolute: 1.3 10*3/uL — ABNORMAL HIGH (ref 0.1–1.0)
Monocytes Relative: 4 % (ref 3–12)
NEUTROS ABS: 30.4 10*3/uL — AB (ref 1.7–7.7)
Neutrophils Relative %: 93 % — ABNORMAL HIGH (ref 43–77)
PLATELETS: 310 10*3/uL (ref 150–400)
RBC: 3.77 MIL/uL — ABNORMAL LOW (ref 3.87–5.11)
RDW: 17.5 % — AB (ref 11.5–15.5)
WBC: 32.7 10*3/uL — AB (ref 4.0–10.5)

## 2013-11-02 LAB — LACTIC ACID, PLASMA: LACTIC ACID, VENOUS: 1.9 mmol/L (ref 0.5–2.2)

## 2013-11-02 LAB — URINALYSIS, ROUTINE W REFLEX MICROSCOPIC
GLUCOSE, UA: NEGATIVE mg/dL
HGB URINE DIPSTICK: NEGATIVE
KETONES UR: NEGATIVE mg/dL
Leukocytes, UA: NEGATIVE
Nitrite: NEGATIVE
PROTEIN: NEGATIVE mg/dL
Specific Gravity, Urine: 1.02 (ref 1.005–1.030)
Urobilinogen, UA: 0.2 mg/dL (ref 0.0–1.0)
pH: 6 (ref 5.0–8.0)

## 2013-11-02 LAB — STREP PNEUMONIAE URINARY ANTIGEN: Strep Pneumo Urinary Antigen: NEGATIVE

## 2013-11-02 LAB — BASIC METABOLIC PANEL
BUN: 9 mg/dL (ref 6–23)
CHLORIDE: 109 meq/L (ref 96–112)
CO2: 23 mEq/L (ref 19–32)
Calcium: 8.1 mg/dL — ABNORMAL LOW (ref 8.4–10.5)
Creatinine, Ser: 0.52 mg/dL (ref 0.50–1.10)
GFR calc non Af Amer: >90 mL/min (ref >90)
Glucose, Bld: 117 mg/dL — ABNORMAL HIGH (ref 70–99)
POTASSIUM: 4.1 meq/L (ref 3.7–5.3)
SODIUM: 145 meq/L (ref 137–147)

## 2013-11-02 LAB — PRO B NATRIURETIC PEPTIDE: Pro B Natriuretic peptide (BNP): 3533 pg/mL — ABNORMAL HIGH (ref 0–125)

## 2013-11-02 LAB — GLUCOSE, CAPILLARY
Glucose-Capillary: 97 mg/dL (ref 70–99)
Glucose-Capillary: 105 mg/dL — ABNORMAL HIGH (ref 70–99)
Glucose-Capillary: 155 mg/dL — ABNORMAL HIGH (ref 70–99)

## 2013-11-02 LAB — TROPONIN I
Troponin I: <0.3 ng/mL (ref <0.30)
Troponin I: <0.3 ng/mL (ref <0.30)
Troponin I: <0.3 ng/mL (ref <0.30)
Troponin I: <0.3 ng/mL (ref <0.30)

## 2013-11-02 LAB — PROCALCITONIN: Procalcitonin: 0.97 ng/mL

## 2013-11-02 LAB — CG4 I-STAT (LACTIC ACID): LACTIC ACID, VENOUS: 2.88 mmol/L — AB (ref 0.5–2.2)

## 2013-11-02 LAB — SEDIMENTATION RATE: Sed Rate: 93 mm/hr — ABNORMAL HIGH (ref 0–22)

## 2013-11-02 LAB — MAGNESIUM: Magnesium: 1.6 mg/dL (ref 1.5–2.5)

## 2013-11-02 MED ORDER — METHYLPREDNISOLONE SODIUM SUCC 125 MG IJ SOLR
125.0000 mg | Freq: Once | INTRAMUSCULAR | Status: AC
  Administered 2013-11-02: 125 mg via INTRAVENOUS
  Filled 2013-11-02: qty 2

## 2013-11-02 MED ORDER — ETODOLAC 300 MG PO CAPS
500.0000 mg | ORAL_CAPSULE | Freq: Two times a day (BID) | ORAL | Status: DC
  Administered 2013-11-02 – 2013-11-06 (×9): 500 mg via ORAL
  Filled 2013-11-02 (×10): qty 1

## 2013-11-02 MED ORDER — CEFEPIME HCL 2 G IJ SOLR
2.0000 g | Freq: Once | INTRAMUSCULAR | Status: AC
  Administered 2013-11-02: 2 g via INTRAVENOUS

## 2013-11-02 MED ORDER — VANCOMYCIN HCL IN DEXTROSE 1-5 GM/200ML-% IV SOLN
1000.0000 mg | Freq: Once | INTRAVENOUS | Status: AC
  Administered 2013-11-02: 1000 mg via INTRAVENOUS
  Filled 2013-11-02: qty 200

## 2013-11-02 MED ORDER — LEVETIRACETAM 500 MG PO TABS
500.0000 mg | ORAL_TABLET | Freq: Two times a day (BID) | ORAL | Status: DC
  Administered 2013-11-02 – 2013-11-06 (×9): 500 mg via ORAL
  Filled 2013-11-02 (×10): qty 1

## 2013-11-02 MED ORDER — HEPARIN SODIUM (PORCINE) 5000 UNIT/ML IJ SOLN
5000.0000 [IU] | Freq: Three times a day (TID) | INTRAMUSCULAR | Status: DC
  Administered 2013-11-02 – 2013-11-05 (×8): 5000 [IU] via SUBCUTANEOUS
  Filled 2013-11-02 (×13): qty 1

## 2013-11-02 MED ORDER — FOLIC ACID 1 MG PO TABS
1.0000 mg | ORAL_TABLET | Freq: Every day | ORAL | Status: DC
  Administered 2013-11-02 – 2013-11-06 (×5): 1 mg via ORAL
  Filled 2013-11-02 (×5): qty 1

## 2013-11-02 MED ORDER — SODIUM CHLORIDE 0.9 % IV SOLN
1000.0000 mL | Freq: Once | INTRAVENOUS | Status: AC
  Administered 2013-11-02: 1000 mL via INTRAVENOUS

## 2013-11-02 MED ORDER — DEXTROSE 5 % IV SOLN
1.0000 g | Freq: Three times a day (TID) | INTRAVENOUS | Status: DC
  Administered 2013-11-02 – 2013-11-03 (×3): 1 g via INTRAVENOUS
  Filled 2013-11-02 (×6): qty 1

## 2013-11-02 MED ORDER — PANTOPRAZOLE SODIUM 40 MG PO TBEC: 40.0000 mg | DELAYED_RELEASE_TABLET | Freq: Two times a day (BID) | ORAL | Status: DC

## 2013-11-02 MED ORDER — LEVOFLOXACIN IN D5W 750 MG/150ML IV SOLN
750.0000 mg | INTRAVENOUS | Status: DC
  Administered 2013-11-02: 750 mg via INTRAVENOUS
  Filled 2013-11-02 (×2): qty 150

## 2013-11-02 MED ORDER — SODIUM CHLORIDE 0.9 % IV BOLUS (SEPSIS)
1000.0000 mL | Freq: Once | INTRAVENOUS | Status: AC
  Administered 2013-11-02: 1000 mL via INTRAVENOUS

## 2013-11-02 MED ORDER — INSULIN ASPART 100 UNIT/ML ~~LOC~~ SOLN
1.0000 [IU] | SUBCUTANEOUS | Status: DC
  Administered 2013-11-02: 2 [IU] via SUBCUTANEOUS
  Administered 2013-11-03: 1 [IU] via SUBCUTANEOUS

## 2013-11-02 MED ORDER — SUMATRIPTAN SUCCINATE 100 MG PO TABS
100.0000 mg | ORAL_TABLET | Freq: Every day | ORAL | Status: DC | PRN
  Filled 2013-11-02: qty 1

## 2013-11-02 MED ORDER — POTASSIUM CHLORIDE 10 MEQ/100ML IV SOLN
10.0000 meq | INTRAVENOUS | Status: AC
  Administered 2013-11-02 (×2): 10 meq via INTRAVENOUS
  Filled 2013-11-02 (×2): qty 100

## 2013-11-02 MED ORDER — IOHEXOL 350 MG/ML SOLN
100.0000 mL | Freq: Once | INTRAVENOUS | Status: AC | PRN
  Administered 2013-11-02: 100 mL via INTRAVENOUS

## 2013-11-02 MED ORDER — TRAMADOL HCL 50 MG PO TABS
50.0000 mg | ORAL_TABLET | Freq: Two times a day (BID) | ORAL | Status: DC
  Administered 2013-11-02 – 2013-11-03 (×4): 50 mg via ORAL
  Filled 2013-11-02 (×4): qty 1

## 2013-11-02 MED ORDER — PREDNISONE 50 MG PO TABS
60.0000 mg | ORAL_TABLET | Freq: Every day | ORAL | Status: DC
  Filled 2013-11-02: qty 1

## 2013-11-02 MED ORDER — VALACYCLOVIR HCL 500 MG PO TABS
1000.0000 mg | ORAL_TABLET | Freq: Two times a day (BID) | ORAL | Status: DC
  Administered 2013-11-02 (×2): 1000 mg via ORAL
  Filled 2013-11-02 (×4): qty 2

## 2013-11-02 MED ORDER — SODIUM CHLORIDE 0.9 % IV SOLN
1000.0000 mL | INTRAVENOUS | Status: DC
  Administered 2013-11-02: 1000 mL via INTRAVENOUS

## 2013-11-02 MED ORDER — METHYLPREDNISOLONE SODIUM SUCC 125 MG IJ SOLR
250.0000 mg | Freq: Four times a day (QID) | INTRAMUSCULAR | Status: AC
  Administered 2013-11-02 – 2013-11-03 (×6): 250 mg via INTRAVENOUS
  Filled 2013-11-02 (×7): qty 4

## 2013-11-02 MED ORDER — VANCOMYCIN HCL IN DEXTROSE 750-5 MG/150ML-% IV SOLN
750.0000 mg | Freq: Two times a day (BID) | INTRAVENOUS | Status: DC
  Administered 2013-11-02 (×2): 750 mg via INTRAVENOUS
  Filled 2013-11-02 (×3): qty 150

## 2013-11-02 MED ORDER — PANTOPRAZOLE SODIUM 40 MG PO TBEC
40.0000 mg | DELAYED_RELEASE_TABLET | Freq: Two times a day (BID) | ORAL | Status: DC
  Administered 2013-11-02 – 2013-11-05 (×7): 40 mg via ORAL
  Filled 2013-11-02 (×7): qty 1

## 2013-11-02 MED ORDER — SODIUM CHLORIDE 0.9 % IV SOLN: 1000.0000 mL | Freq: Once | INTRAVENOUS | Status: AC

## 2013-11-02 MED ORDER — FENTANYL 50 MCG/HR TD PT72
50.0000 ug | MEDICATED_PATCH | TRANSDERMAL | Status: DC
  Administered 2013-11-05: 50 ug via TRANSDERMAL
  Filled 2013-11-02: qty 1

## 2013-11-02 MED ORDER — PREGABALIN 50 MG PO CAPS
300.0000 mg | ORAL_CAPSULE | Freq: Two times a day (BID) | ORAL | Status: DC
  Administered 2013-11-02 – 2013-11-04 (×6): 300 mg via ORAL
  Filled 2013-11-02 (×4): qty 4
  Filled 2013-11-02: qty 6
  Filled 2013-11-02: qty 4

## 2013-11-02 MED ORDER — VITAMIN B-12 1000 MCG PO TABS
2000.0000 ug | ORAL_TABLET | Freq: Every day | ORAL | Status: DC
  Administered 2013-11-02 – 2013-11-06 (×5): 2000 ug via ORAL
  Filled 2013-11-02 (×5): qty 2

## 2013-11-02 NOTE — H&P (Signed)
INTERNAL MEDICINE TEACHING ATTENDING NOTE  Day 0 of stay  Patient name: [name] [name]  MRN: [id number] Date of birth: 09/11/1961   53 y.o. lady from [name], with unclear past medical history (fibromyalgia, questionable seizure and asthma, with attacks of hypoxia suffered when in [name] [facility] s/p admission for CAP, severe sepsis. An extensive work up, as detailed in Dr Doristine Locksater's note has been done in the previous admission to figure out the etiology for pneumonia with extensive lung involvement. 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 She is positive that the house that she lives in is clean and does not carry mould. Its new [employer].   I have reviewed the vitals, medical history, labs, and imaging of this lady.   Exam today is significant for cooperative patient, wearing a non-rebreather mask, able to talk but gets tired while talking, resolving tachypnea, rales mostly all lung fields - most pronounced in the lower regions, with restricted air movement. Cardiac exam shows regular tachycardia. No murmurs.   Assessment and Plan  Acute respiratory failure - for now being treated as secondary to HCAP - Agree with the work up and vancomycin + ceftriaxone. The high leucocyte count could be result of steroid intake. Await antigen panel and respiratory virus panel results. Reviewed previous rheumatologic work up and an autoimmune lung disorder is a possibility. Would obtain pulmonology recs for this patient - question of biopsy to be done as inpatient. Reviewed CT imaging - PE negative.    I have seen and evaluated this patient and discussed it with my IM resident team.  Please see the rest of the plan per resident note from today.   Deneen Slager 11/02/2013, 11:55 AM.

## 2013-11-02 NOTE — ED Provider Notes (Signed)
CSN: [id number]     Arrival date & time 11/02/13  0009 History   First MD Initiated Contact with Patient 11/02/13 0012     Chief Complaint  Patient presents with  . Fever  . Shortness of Breath     (Consider location/radiation/quality/duration/timing/severity/associated sxs/prior Treatment) Patient is a 53 y.o. female presenting with fever and shortness of breath. The history is provided by the patient.  Fever Shortness of Breath Associated symptoms: fever   She was recently discharged from the hospital after being admitted for sepsis. She states you've been doing reasonably well until this morning when she started having fever and difficulty breathing. She is somewhat of a poor historian but she denies any chest pain and there's been no nausea or vomiting. She has given inconsistent answers when asked about whether she is coughing. She has been having some diarrhea. She's also complaining of generalized body aches. Symptoms are severe.  Past Medical History  Diagnosis Date  . Fibromyalgia   . Chronic pain   . Seizures   . Migraine   . Pneumonia    History reviewed. No pertinent past surgical history. No family history on file. History  Substance Use Topics  . Smoking status: Never Smoker   . Smokeless tobacco: Never Used  . Alcohol Use: No   OB History   Grav Para Term Preterm Abortions TAB SAB Ect Mult Living                 Review of Systems  Constitutional: Positive for fever.  Respiratory: Positive for shortness of breath.   All other systems reviewed and are negative.      Allergies  Shellfish allergy  Home Medications   Current Outpatient Rx  Name  Route  Sig  Dispense  Refill  . Cyanocobalamin (B-12) 2000 MCG TABS   Oral   Take 2,000 mcg by mouth daily.   30 tablet   12   . etodolac (LODINE) 500 MG tablet   Oral   Take 500 mg by mouth 2 (two) times daily.          . fentaNYL (DURAGESIC - DOSED MCG/HR) 50 MCG/HR   Transdermal   Place 1 patch  (50 mcg total) onto the skin every 3 (three) days.   4 patch   0   . folic acid (FOLVITE) 1 MG tablet      Take 1 tablet daily.   30 tablet   0   . levETIRAcetam (KEPPRA) 500 MG tablet   Oral   Take 1 tablet (500 mg total) by mouth 2 (two) times daily.   60 tablet   2   . pantoprazole (PROTONIX) 40 MG tablet   Oral   Take 40 mg by mouth 2 (two) times daily.         . polyethylene glycol (MIRALAX / GLYCOLAX) packet   Oral   Take 17 g by mouth daily as needed for mild constipation or moderate constipation.   14 each   0   . potassium chloride (K-DUR) 10 MEQ tablet   Oral   Take 1 tablet (10 mEq total) by mouth daily.   30 tablet   0   . predniSONE (DELTASONE) 10 MG tablet      Take 40 mg daily with breakfast for 3 days (10/29/2013 - 10/31/2013), then  Take 30 mg daily with breakfast for 3 days (2/14 -2/16), then  Take 20 mg daily with breakfast for 3 days (2/17-2/19) Then  Take 10  mg daily with breakfast for 3 days (2/10-2/20) and stop.   30 tablet   0   . pregabalin (LYRICA) 300 MG capsule   Oral   Take 1 capsule (300 mg total) by mouth 2 (two) times daily.   60 capsule   0   . rizatriptan (MAXALT) 10 MG tablet   Oral   Take 10 mg by mouth daily as needed for migraine. May repeat in 2 hours if needed         . traMADol (ULTRAM) 50 MG tablet   Oral   Take 1 tablet (50 mg total) by mouth every 12 (twelve) hours as needed for moderate pain.   42 tablet   0   . valACYclovir (VALTREX) 1000 MG tablet   Oral   Take 1 tablet (1,000 mg total) by mouth 2 (two) times daily.   12 tablet   0   . Vitamin D, Ergocalciferol, (DRISDOL) 50000 UNITS CAPS capsule   Oral   Take 1 capsule (50,000 Units total) by mouth every 7 (seven) days.   8 capsule   0    Pulse 143  Temp(Src) 102.3 F (39.1 C) (Rectal)  Resp 22  SpO2 98% Physical Exam  Nursing note and vitals reviewed.  Somewhat toxic appearing 53 year old female, in mild respiratory distress. Vital signs  are significant for fever temperature 102.3, history rate 22, and tachycardia with heart rate 143. Oxygen saturation is 98%, which is normal. Head is normocephalic and atraumatic. PERRLA, EOMI. Oropharynx is clear. Eyes are sunken. Neck is nontender and supple without adenopathy or JVD. Back is nontender and there is no CVA tenderness. Lungs Have rales in the right upper lung fields. Chest is nontender. Heart his tachycardic without murmur. Abdomen is soft, flat, nontender without masses or hepatosplenomegaly and peristalsis is normoactive. Extremities have no cyanosis or edema, full range of motion is present. Skin is warm and dry without rash. Neurologic: Mental status is normal, cranial nerves are intact, there are no motor or sensory deficits.  ED Course  Procedures (including critical care time) Labs Review Results for orders placed during the hospital encounter of 11/02/13  CBC WITH DIFFERENTIAL      Result Value Ref Range   WBC 32.7 (*) 4.0 - 10.5 K/uL   RBC 3.77 (*) 3.87 - 5.11 MIL/uL   Hemoglobin 10.8 (*) 12.0 - 15.0 g/dL   HCT 16.1 (*) 09.6 - 04.5 %   MCV 89.1  78.0 - 100.0 fL   MCH 28.6  26.0 - 34.0 pg   MCHC 32.1  30.0 - 36.0 g/dL   RDW 40.9 (*) 81.1 - 91.4 %   Platelets 310  150 - 400 K/uL   Neutrophils Relative % 93 (*) 43 - 77 %   Lymphocytes Relative 3 (*) 12 - 46 %   Monocytes Relative 4  3 - 12 %   Eosinophils Relative 0  0 - 5 %   Basophils Relative 0  0 - 1 %   Neutro Abs 30.4 (*) 1.7 - 7.7 K/uL   Lymphs Abs 1.0  0.7 - 4.0 K/uL   Monocytes Absolute 1.3 (*) 0.1 - 1.0 K/uL   Eosinophils Absolute 0.0  0.0 - 0.7 K/uL   Basophils Absolute 0.0  0.0 - 0.1 K/uL   RBC Morphology TEARDROP CELLS    COMPREHENSIVE METABOLIC PANEL      Result Value Ref Range   Sodium 139  137 - 147 mEq/L   Potassium 3.5 (*) 3.7 -  5.3 mEq/L   Chloride 98  96 - 112 mEq/L   CO2 24  19 - 32 mEq/L   Glucose, Bld 112 (*) 70 - 99 mg/dL   BUN 13  6 - 23 mg/dL   Creatinine, Ser 1.61  0.50 -  1.10 mg/dL   Calcium 9.0  8.4 - 09.6 mg/dL   Total Protein 7.0  6.0 - 8.3 g/dL   Albumin 2.5 (*) 3.5 - 5.2 g/dL   AST 25  0 - 37 U/L   ALT 31  0 - 35 U/L   Alkaline Phosphatase 143 (*) 39 - 117 U/L   Total Bilirubin 0.6  0.3 - 1.2 mg/dL   GFR calc non Af Amer >90  >90 mL/min   GFR calc Af Amer >90  >90 mL/min  CG4 I-STAT (LACTIC ACID)      Result Value Ref Range   Lactic Acid, Venous 2.88 (*) 0.5 - 2.2 mmol/L  POCT I-STAT 3, BLOOD GAS (G3+)      Result Value Ref Range   pH, Arterial 7.519 (*) 7.350 - 7.450   pCO2 arterial 30.8 (*) 35.0 - 45.0 mmHg   pO2, Arterial 57.0 (*) 80.0 - 100.0 mmHg   Bicarbonate 25.1 (*) 20.0 - 24.0 mEq/L   TCO2 26  0 - 100 mmol/L   O2 Saturation 92.0     Acid-Base Excess 3.0 (*) 0.0 - 2.0 mmol/L   Patient temperature 98.6 F     Collection site RADIAL, ALLEN'S TEST ACCEPTABLE     Drawn by Operator     Sample type ARTERIAL     Imaging Review Dg Chest Port 1 View  11/02/2013   CLINICAL DATA:  Fever, shortness of breath  EXAM: PORTABLE CHEST - 1 VIEW  COMPARISON:  Prior chest x-ray 10/21/2013  FINDINGS: Stable cardiac and mediastinal contours. Increased left lower lobe patchy airspace opacity. Background diffuse interstitial prominence and bilateral central airway thickening is similar compared to prior. No pleural effusion or pneumothorax. No acute osseous abnormality.  IMPRESSION: Developing left basilar opacity concerning for and acute on chronic infectious or inflammatory process.  Persistent bilateral upper an mid lung irregular airspace opacities without significant interval change.   Electronically Signed   By: Malachy Moan M.D.   On: 11/02/2013 01:11   Images viewed by me.  CRITICAL CARE Performed by: Dione Booze Total critical care time: 45 minutes Critical care time was exclusive of separately billable procedures and treating other patients. Critical care was necessary to treat or prevent imminent or life-threatening deterioration. Critical  care was time spent personally by me on the following activities: development of treatment plan with patient and/or surrogate as well as nursing, discussions with consultants, evaluation of patient's response to treatment, examination of patient, obtaining history from patient or surrogate, ordering and performing treatments and interventions, ordering and review of laboratory studies, ordering and review of radiographic studies, pulse oximetry and re-evaluation of patient's condition.  MDM   Final diagnoses:  HCAP (healthcare-associated pneumonia)  Leukocytosis    Fever, tachycardia, dyspnea worrisome for pneumonia. Old records are reviewed and she had been admitted to the hospital with sepsis syndrome and discharged on February 10. She was not on antibiotics when discharged. She is on a tapering dose of prednisone, so she will be given additional steroids to cover her immediate stress. Sepsis workup is initiated and she is started on early goal-directed therapy.  Chest x-ray appears to show pneumonia and she is started on antibiotics for healthcare associated pneumonia. Lactic  acid level is only mildly elevated at 2.88. WBC is markedly elevated at 32.7. Case is discussed with Dr. Virgina OrganQureshi  Of internal medicine service agrees to admit the patient.who    Dione Boozeavid Deaundra Dupriest, MD 11/02/13 [phone]

## 2013-11-02 NOTE — Progress Notes (Signed)
ABG unsuccessful x2. MD aware.

## 2013-11-02 NOTE — ED Notes (Addendum)
Dr Rhunette CroftNanavati given a copy of lactic acid results 2.88

## 2013-11-02 NOTE — ED Notes (Addendum)
Patient with fever and shortness of breath getting worse in the last few days.  Patient is from home and has been recently here with PNA and ALOC.  Patient continues with shortness of breath, diminished lung sounds per EMS.  Patient was given 1g APAP with EMS and 5mg  Albuterol via EMS.

## 2013-11-02 NOTE — Progress Notes (Signed)
ANTIBIOTIC CONSULT NOTE - INITIAL  Pharmacy Consult for vancomycin Indication: pneumonia  Allergies  Allergen Reactions  . Shellfish Allergy Anaphylaxis    Patient Measurements: Height: 5' 1.02" (155 cm) Weight: 127 lb 13.9 oz (58 kg) IBW/kg (Calculated) : 47.86  Vital Signs: Temp: 100 F (37.8 C) (02/15 0231) Temp src: Rectal (02/15 0231) BP: 133/81 mmHg (02/15 0315) Pulse Rate: 81 (02/15 0315)  Labs:  Recent Labs  11/02/13 0035  WBC 32.7*  HGB 10.8*  PLT 310  CREATININE 0.67   Estimated Creatinine Clearance: 68.2 ml/min (by C-G formula based on Cr of 0.67).   Microbiology: Recent Results (from the past 720 hour(s))  CULTURE, BLOOD (ROUTINE X 2)     Status: None   Collection Time    10/05/13  5:49 PM      Result Value Ref Range Status   Specimen Description BLOOD RIGHT ARM   Final   Special Requests BOTTLES DRAWN AEROBIC AND ANAEROBIC [facility] EACH   Final   Culture  Setup Time     Final   Value: 10/06/2013 08:56     Performed at [facility]   Culture     Final   Value: NO GROWTH 5 DAYS     Performed at [facility]   Report Status 10/12/2013 FINAL   Final  MRSA PCR SCREENING     Status: None   Collection Time    10/05/13  7:36 PM      Result Value Ref Range Status   MRSA by PCR NEGATIVE  NEGATIVE Final   Comment:            The GeneXpert MRSA Assay (FDA     approved for NASAL specimens     only), is one component of a     comprehensive MRSA colonization     surveillance program. It is not     intended to diagnose MRSA     infection nor to guide or     monitor treatment for     MRSA infections.  CULTURE, BLOOD (ROUTINE X 2)     Status: None   Collection Time    10/05/13  9:08 PM      Result Value Ref Range Status   Specimen Description BLOOD LEFT ANTECUBITAL   Final   Special Requests BOTTLES DRAWN AEROBIC AND ANAEROBIC 10CC EA   Final   Culture  Setup Time     Final   Value: 10/06/2013 08:56     Performed at [facility]   Culture     Final   Value: NO GROWTH 5 DAYS     Performed at [facility]   Report Status 10/12/2013 FINAL   Final  URINE CULTURE     Status: None   Collection Time    10/07/13 12:18 AM      Result Value Ref Range Status   Specimen Description URINE, RANDOM   Final   Special Requests NONE   Final   Culture  Setup Time     Final   Value: 10/07/2013 00:35     Performed at [place]     Final   Value: NO GROWTH     Performed at [facility]   Culture     Final   Value: NO GROWTH     Performed at [facility]   Report Status 10/08/2013 FINAL   Final  CULTURE, BLOOD (ROUTINE X 2)  Status: None   Collection Time    10/14/13  6:40 PM      Result Value Ref Range Status   Specimen Description BLOOD LEFT ARM   Final   Special Requests BOTTLES DRAWN AEROBIC ONLY 4CC   Final   Culture  Setup Time     Final   Value: 10/15/2013 01:57     Performed at [facility]   Culture     Final   Value: NO GROWTH 5 DAYS     Performed at [facility]   Report Status 10/21/2013 FINAL   Final  CULTURE, BLOOD (ROUTINE X 2)     Status: None   Collection Time    10/14/13  6:45 PM      Result Value Ref Range Status   Specimen Description BLOOD LEFT HAND   Final   Special Requests BOTTLES DRAWN AEROBIC ONLY 5CC   Final   Culture  Setup Time     Final   Value: 10/15/2013 01:57     Performed at [facility]   Culture     Final   Value: NO GROWTH 5 DAYS     Performed at [facility]   Report Status 10/21/2013 FINAL   Final  URINE CULTURE     Status: None   Collection Time    10/14/13  8:56 PM      Result Value Ref Range Status   Specimen Description URINE, CATHETERIZED   Final   Special Requests NONE   Final   Culture  Setup Time     Final   Value: 10/14/2013 21:53     Performed at [place]     Final   Value: NO GROWTH     Performed at [facility]   Culture     Final   Value:  NO GROWTH     Performed at [facility]   Report Status 10/15/2013 FINAL   Final  MRSA PCR SCREENING     Status: None   Collection Time    10/15/13  2:15 AM      Result Value Ref Range Status   MRSA by PCR NEGATIVE  NEGATIVE Final   Comment:            The GeneXpert MRSA Assay (FDA     approved for NASAL specimens     only), is one component of a     comprehensive MRSA colonization     surveillance program. It is not     intended to diagnose MRSA     infection nor to guide or     monitor treatment for     MRSA infections.  RAPID STREP SCREEN     Status: None   Collection Time    10/15/13  6:26 AM      Result Value Ref Range Status   Streptococcus, Group A Screen (Direct) NEGATIVE  NEGATIVE Final   Comment: (NOTE)     A Rapid Antigen test may result negative if the antigen level in the     sample is below the detection level of this test. The FDA has not     cleared this test as a stand-alone test therefore the rapid antigen     negative result has reflexed to a Group A Strep culture.  CULTURE, GROUP A STREP     Status: None   Collection Time    10/15/13  6:26 AM  Result Value Ref Range Status   Specimen Description THROAT   Final   Special Requests NONE   Final   Culture     Final   Value: No Beta Hemolytic Streptococci Isolated     Performed at [facility]   Report Status 10/18/2013 FINAL   Final  CULTURE, RESPIRATORY (NON-EXPECTORATED)     Status: None   Collection Time    10/15/13  3:47 PM      Result Value Ref Range Status   Specimen Description BRONCHIAL ALVEOLAR LAVAGE   Final   Special Requests NONE   Final   Gram Stain     Final   Value: ABUNDANT WBC PRESENT, PREDOMINANTLY PMN     NO SQUAMOUS EPITHELIAL CELLS SEEN     NO ORGANISMS SEEN     Performed at [facility]   Culture     Final   Value: NO GROWTH 2 DAYS     Performed at [facility]   Report Status 10/17/2013 FINAL   Final  AFB CULTURE WITH SMEAR     Status: None    Collection Time    10/15/13  3:47 PM      Result Value Ref Range Status   Specimen Description BRONCHIAL ALVEOLAR LAVAGE   Final   Special Requests NONE   Final   ACID FAST SMEAR     Final   Value: NO ACID FAST BACILLI SEEN     Performed at [facility]   Culture     Final   Value: CULTURE WILL BE EXAMINED FOR 6 WEEKS BEFORE ISSUING A FINAL REPORT     Performed at [facility]   Report Status PENDING   Incomplete  PNEUMOCYSTIS JIROVECI SMEAR BY DFA     Status: None   Collection Time    10/15/13  3:47 PM      Result Value Ref Range Status   Specimen Source-[facility] BRONCHIAL ALVEOLAR LAVAGE   Final   Pneumocystis jiroveci Ag NEGATIVE   Final  FUNGUS CULTURE W SMEAR     Status: None   Collection Time    10/15/13  3:47 PM      Result Value Ref Range Status   Specimen Description BRONCHIAL ALVEOLAR LAVAGE   Final   Special Requests NONE   Final   Fungal Smear     Final   Value: NO YEAST OR FUNGAL ELEMENTS SEEN     Performed at [facility]   Culture     Final   Value: CULTURE IN PROGRESS FOR FOUR WEEKS     Performed at [facility]   Report Status PENDING   Incomplete  RESPIRATORY VIRUS PANEL     Status: None   Collection Time    10/15/13  3:47 PM      Result Value Ref Range Status   Source - [facility] BRONCHIAL ALVEOLAR LAVAGE   Corrected   Comment: CORRECTED ON 01/30 AT 2221: PREVIOUSLY REPORTED AS BRONCHIAL ALVEOLAR LAVAGE   Respiratory Syncytial Virus A NOT DETECTED   Final   Respiratory Syncytial Virus B NOT DETECTED   Final   Influenza A NOT DETECTED   Final   Influenza B NOT DETECTED   Final   Parainfluenza 1 NOT DETECTED   Final   Parainfluenza 2 NOT DETECTED   Final   Parainfluenza 3 NOT DETECTED   Final   Metapneumovirus NOT DETECTED   Final   Rhinovirus NOT DETECTED   Final  Adenovirus NOT DETECTED   Final   Influenza A H1 NOT DETECTED   Final   Influenza A H3 NOT DETECTED   Final   Comment: (NOTE)           Normal Reference Range  for each Analyte: NOT DETECTED     Testing performed using the Luminex xTAG Respiratory Viral Panel test     kit.     This test was developed and its performance characteristics determined     by [facility]. It has not been cleared or approved by the [place]     Food and Drug Administration. This test is used for clinical purposes.     It should not be regarded as investigational or for research. This     laboratory is certified under the [place] (CLIA) as qualified to perform high complexity     clinical laboratory testing.     Performed at [facility] DIFFICILE BY PCR     Status: None   Collection Time    10/22/13 12:03 PM      Result Value Ref Range Status   C difficile by pcr NEGATIVE  NEGATIVE Final  URINE CULTURE     Status: None   Collection Time    10/22/13  9:15 PM      Result Value Ref Range Status   Specimen Description URINE, CATHETERIZED   Final   Special Requests NONE   Final   Culture  Setup Time     Final   Value: 10/22/2013 22:16     Performed at [place]     Final   Value: NO GROWTH     Performed at [facility]   Culture     Final   Value: NO GROWTH     Performed at [facility]   Report Status 10/23/2013 FINAL   Final  CULTURE, BLOOD (ROUTINE X 2)     Status: None   Collection Time    10/22/13  9:51 PM      Result Value Ref Range Status   Specimen Description BLOOD LEFT ARM   Final   Special Requests BOTTLES DRAWN AEROBIC AND ANAEROBIC 10CC   Final   Culture  Setup Time     Final   Value: 10/23/2013 00:26     Performed at [facility]   Culture     Final   Value: STAPHYLOCOCCUS SPECIES (COAGULASE NEGATIVE)     Note: THE SIGNIFICANCE OF ISOLATING THIS ORGANISM FROM A SINGLE SET OF BLOOD CULTURES WHEN MULTIPLE SETS ARE DRAWN IS UNCERTAIN. PLEASE NOTIFY THE MICROBIOLOGY DEPARTMENT WITHIN ONE WEEK IF SPECIATION AND SENSITIVITIES ARE  REQUIRED.     Note: Gram Stain Report Called to,Read Back By and Verified With: TINA SAVAGE ON 10/23/2013 AT 10:31P BY WILEJ     Performed at [facility]   Report Status 10/25/2013 FINAL   Final  CULTURE, BLOOD (ROUTINE X 2)     Status: None   Collection Time    10/22/13 11:10 PM      Result Value Ref Range Status   Specimen Description BLOOD LEFT FINGER   Final   Special Requests BOTTLES DRAWN AEROBIC ONLY 2CC FROM LEFT THUMB   Final   Culture  Setup Time     Final   Value: 10/23/2013 08:45     Performed at [facility]  Final   Value: NO GROWTH 5 DAYS     Performed at [facility]   Report Status 10/29/2013 FINAL   Final  WET PREP, GENITAL     Status: Abnormal   Collection Time    10/23/13  7:16 PM      Result Value Ref Range Status   Yeast Wet Prep HPF POC NONE SEEN  NONE SEEN Final   Trich, Wet Prep NONE SEEN  NONE SEEN Final   Clue Cells Wet Prep HPF POC NONE SEEN  NONE SEEN Final   WBC, Wet Prep HPF POC MODERATE (*) NONE SEEN Final  CULTURE, BLOOD (ROUTINE X 2)     Status: None   Collection Time    10/24/13  2:40 AM      Result Value Ref Range Status   Specimen Description BLOOD RIGHT ARM   Final   Special Requests BOTTLES DRAWN AEROBIC ONLY 10CC   Final   Culture  Setup Time     Final   Value: 10/24/2013 08:30     Performed at [facility]   Culture     Final   Value: NO GROWTH 5 DAYS     Performed at [facility]   Report Status 10/30/2013 FINAL   Final  CULTURE, BLOOD (ROUTINE X 2)     Status: None   Collection Time    10/24/13  2:50 AM      Result Value Ref Range Status   Specimen Description BLOOD RIGHT HAND   Final   Special Requests     Final   Value: BOTTLES DRAWN AEROBIC AND ANAEROBIC 10CC BLUE,7CC RED   Culture  Setup Time     Final   Value: 10/24/2013 08:30     Performed at [facility]   Culture     Final   Value: NO GROWTH 5 DAYS     Performed at [facility]   Report Status  10/30/2013 FINAL   Final  URINE CULTURE     Status: None   Collection Time    10/26/13  8:36 AM      Result Value Ref Range Status   Specimen Description URINE, RANDOM   Final   Special Requests NONE   Final   Culture  Setup Time     Final   Value: 10/26/2013 18:01     Performed at [facility]     Final   Value: NO GROWTH     Performed at [facility]   Culture     Final   Value: NO GROWTH     Performed at [facility]   Report Status 10/27/2013 FINAL   Final    Medical History: Past Medical History  Diagnosis Date  . Fibromyalgia   . Chronic pain   . Seizures   . Migraine   . Pneumonia     Medications:  Prescriptions prior to admission  Medication Sig Dispense Refill  . Cyanocobalamin (B-12) 2000 MCG TABS Take 2,000 mcg by mouth daily.  30 tablet  12  . etodolac (LODINE) 500 MG tablet Take 500 mg by mouth 2 (two) times daily.       . fentaNYL (DURAGESIC - DOSED MCG/HR) 50 MCG/HR Place 1 patch (50 mcg total) onto the skin every 3 (three) days.  4 patch  0  . folic acid (FOLVITE) 1 MG tablet Take 1 tablet daily.  30 tablet  0  . levETIRAcetam (  KEPPRA) 500 MG tablet Take 1 tablet (500 mg total) by mouth 2 (two) times daily.  60 tablet  2  . pantoprazole (PROTONIX) 40 MG tablet Take 40 mg by mouth 2 (two) times daily.      . polyethylene glycol (MIRALAX / GLYCOLAX) packet Take 17 g by mouth daily as needed for mild constipation or moderate constipation.  14 each  0  . potassium chloride (K-DUR) 10 MEQ tablet Take 1 tablet (10 mEq total) by mouth daily.  30 tablet  0  . predniSONE (DELTASONE) 10 MG tablet Take 40 mg daily with breakfast for 3 days (10/29/2013 - 10/31/2013), then  Take 30 mg daily with breakfast for 3 days (2/14 -2/16), then  Take 20 mg daily with breakfast for 3 days (2/17-2/19) Then  Take 10 mg daily with breakfast for 3 days (2/10-2/20) and stop.  30 tablet  0  . pregabalin (LYRICA) 300 MG capsule Take 1 capsule (300 mg  total) by mouth 2 (two) times daily.  60 capsule  0  . rizatriptan (MAXALT) 10 MG tablet Take 10 mg by mouth daily as needed for migraine. May repeat in 2 hours if needed      . traMADol (ULTRAM) 50 MG tablet Take 1 tablet (50 mg total) by mouth every 12 (twelve) hours as needed for moderate pain.  42 tablet  0  . valACYclovir (VALTREX) 1000 MG tablet Take 1 tablet (1,000 mg total) by mouth 2 (two) times daily.  12 tablet  0  . Vitamin D, Ergocalciferol, (DRISDOL) 50000 UNITS CAPS capsule Take 50,000 Units by mouth every 7 (seven) days. Take on Saturday       Scheduled:  . ceFEPime (MAXIPIME) IV  1 g Intravenous 3 times per day  . folic acid  1 mg Oral Daily  . heparin  5,000 Units Subcutaneous 3 times per day  . levETIRAcetam  500 mg Oral BID  . pantoprazole  40 mg Oral BID  . valACYclovir  1,000 mg Oral BID  . vitamin B-12  2,000 mcg Oral Daily    Assessment: 53yo female c/o fever and SOB that has worsened over past few days, had been admitted recently for PNA, new CT of chest still favors multi-lobar PNA, to resume IV ABX.  Goal of Therapy:  Vancomycin trough level 15-20 mcg/ml  Plan:  Rec'd vanc 1g IV in ED; will continue with vancomycin 737m IV Q12H and monitor CBC, Cx, levels prn.  VWynona Neat PharmD, BCPS  11/02/2013,5:49 AM

## 2013-11-02 NOTE — Progress Notes (Addendum)
ANTIBIOTIC CONSULT NOTE - FOLLOW UP  Pharmacy Consult for leaquin Indication: PNA  Allergies  Allergen Reactions  . Shellfish Allergy Anaphylaxis    Patient Measurements: Height: 5' 1.02" (155 cm) Weight: 127 lb 13.9 oz (58 kg) IBW/kg (Calculated) : 47.86  Vital Signs: Temp: 99.8 F (37.7 C) (02/15 1613) Temp src: Oral (02/15 1613) BP: 127/50 mmHg (02/15 1613) Pulse Rate: 76 (02/15 1613) Intake/Output from previous day: 02/14 0701 - 02/15 0700 In: 325 [I.V.:125; IV Piggyback:200] Out: 450 [Urine:450] Intake/Output from this shift: Total I/O In: 240 [P.O.:240] Out: 500 [Urine:500]  Labs:  Recent Labs  11/02/13 0035 11/02/13 0838  WBC 32.7*  --   HGB 10.8*  --   PLT 310  --   CREATININE 0.67 0.52   Estimated Creatinine Clearance: 68.2 ml/min (by C-G formula based on Cr of 0.52). No results found for this basename: VANCOTROUGH, VANCOPEAK, VANCORANDOM, GENTTROUGH, GENTPEAK, GENTRANDOM, TOBRATROUGH, TOBRAPEAK, TOBRARND, AMIKACINPEAK, AMIKACINTROU, AMIKACIN,  in the last 72 hours     Assessment: [age] yo female with CAP/septc shock on cefepime and vancomycin and pharmacy consulted to add levaquin. WBC= 32.7, tmax= 102.3, PCT= 0.97, LA= 1.9, CrCl ~ 70.   Plan:  -Levaquin 750mg  IV q24hr -Will follow renal function, cultures and clinical progress  Harland Germanndrew Graysen Depaula, Pharm D 11/02/2013 4:23 PM

## 2013-11-02 NOTE — Progress Notes (Signed)
Patient pO2 57 on nasal cannula, placed on NRB mask at this time. RT will monitor.

## 2013-11-02 NOTE — H&P (Signed)
Date: 11/02/2013               Patient Name:  [name] [name] MRN: [id number]  DOB: Nov 22, 1960 Age / Sex: 53 y.o., female   PCP: Ivin Poot, MD         Medical Service: Internal Medicine Teaching Service         Attending Physician: Dr. Madilyn Fireman, MD    First Contact: Dr. Loyal Jacobson Pager: [phone]  Second Contact: Dr. Jessee Avers Pager: [phone]       After Hours (After 5p/  First Contact Pager: [phone]  weekends / holidays): Second Contact Pager: [phone]   Chief Complaint: Shortness of breath, fever  History of Present Illness:  [name] [name] is a 53 y.o. [name] female with PMH fibromyalgia and ?seizure disorder with a recent admission for CAP complicated by septic shock requiring ICU-level care (1/27-2/10/15) presents to the ED with recurrent dyspnea.  Patient reports she had been doing well at home since discharge until this morning, when she suddenly started having difficulty breathing. This was accompanied by a cough productive of brown sputum. She felt better in the middle of the day and ate a regular lunch. Her dyspnea then worsened in the afternoon, so her brother brought her to the ED. She endorses some pleuritic type chest pain radiating to the back. She also had 2 episodes of nonbloody diarrhea today. Otherwise, she denies abdominal pain, nausea, vomiting, dysuria, hematuria. She tells [place] she has been fully compliant with all of her medications including her steroid taper, and her brother has been helping her with this. He also fully sanitized her bedroom after discharge. She denies tobacco, alcohol, illicit drugs. She moved here from [place] 3 months ago. She cannot pinpoint an event or trigger associated with her recurrent dyspnea and fevers.  Last admission, patient presented with temp 103 with leukocytosis >40K and worsening bilateral interstitial and airspace opacities with some hilar and paratracheal adenopathy. Her O2 sats dropped to 80% in the ED  and she was placed on BiPAP with no improvement in her respiratory status. She was subsequently intubated and transferred to the ICU. CTA was negative for PE. Bronchoscopy revealed no purulence in her airways, no DAH, no HSV lesions, and no mass lesions. Bronchoalveolar lavage was negative for pneumocystis, AFB, fungus, and group A strep, with negative respiratory virus panel. Rapid flu and HIV were negative and blood cultures showed no growth. She was treated with azithromycin from 1/27-1/29, vancomycin and zosyn from 1/27-1/30, ceftriaxone from 1/30-2/2, and tamiflu from 1/28-1/31. Due to concurrent hematuria, there was concern for vasculitis and she was also started on solumedrol 40m q6hrs. On 1/30, she developed hypotension and bradycardia requiring pressors for 24hrs. By 2/1, her respiratory status improved and she was able to be extubated. She was transferred to the floor on 2/4 and her O2 saturations remained >93% without any oxygen after that.  In the ED she was given Solu-Medrol 125 mg IV x1, 1 L normal saline bolus x3cefepime 2 g IV x1, vancomycin 1000 mg IV x1.   Meds: Current Facility-Administered Medications  Medication Dose Route Frequency Provider Last Rate Last Dose  . 0.9 %  sodium chloride infusion  1,000 mL Intravenous Continuous DDelora Fuel MD 1427mL/hr at 11/02/13 0230 1,000 mL at 11/02/13 0230  . folic acid (FOLVITE) tablet 1 mg  1 mg Oral Daily SJerene Pitch MD      . potassium chloride 10 mEq in 100 mL IVPB  10 mEq Intravenous  Q1 Hr x 2 Jerene Pitch, MD 100 mL/hr at 11/02/13 0350 10 mEq at 11/02/13 0350  . valACYclovir (VALTREX) tablet 1,000 mg  1,000 mg Oral BID Jerene Pitch, MD       Current Outpatient Prescriptions  Medication Sig Dispense Refill  . Cyanocobalamin (B-12) 2000 MCG TABS Take 2,000 mcg by mouth daily.  30 tablet  12  . etodolac (LODINE) 500 MG tablet Take 500 mg by mouth 2 (two) times daily.       . fentaNYL (DURAGESIC - DOSED MCG/HR) 50 MCG/HR Place  1 patch (50 mcg total) onto the skin every 3 (three) days.  4 patch  0  . folic acid (FOLVITE) 1 MG tablet Take 1 tablet daily.  30 tablet  0  . levETIRAcetam (KEPPRA) 500 MG tablet Take 1 tablet (500 mg total) by mouth 2 (two) times daily.  60 tablet  2  . pantoprazole (PROTONIX) 40 MG tablet Take 40 mg by mouth 2 (two) times daily.      . polyethylene glycol (MIRALAX / GLYCOLAX) packet Take 17 g by mouth daily as needed for mild constipation or moderate constipation.  14 each  0  . potassium chloride (K-DUR) 10 MEQ tablet Take 1 tablet (10 mEq total) by mouth daily.  30 tablet  0  . predniSONE (DELTASONE) 10 MG tablet Take 40 mg daily with breakfast for 3 days (10/29/2013 - 10/31/2013), then  Take 30 mg daily with breakfast for 3 days (2/14 -2/16), then  Take 20 mg daily with breakfast for 3 days (2/17-2/19) Then  Take 10 mg daily with breakfast for 3 days (2/10-2/20) and stop.  30 tablet  0  . pregabalin (LYRICA) 300 MG capsule Take 1 capsule (300 mg total) by mouth 2 (two) times daily.  60 capsule  0  . rizatriptan (MAXALT) 10 MG tablet Take 10 mg by mouth daily as needed for migraine. May repeat in 2 hours if needed      . traMADol (ULTRAM) 50 MG tablet Take 1 tablet (50 mg total) by mouth every 12 (twelve) hours as needed for moderate pain.  42 tablet  0  . valACYclovir (VALTREX) 1000 MG tablet Take 1 tablet (1,000 mg total) by mouth 2 (two) times daily.  12 tablet  0  . Vitamin D, Ergocalciferol, (DRISDOL) 50000 UNITS CAPS capsule Take 50,000 Units by mouth every 7 (seven) days. Take on Saturday        Allergies: Allergies as of 11/02/2013 - Review Complete 11/02/2013  Allergen Reaction Noted  . Shellfish allergy Anaphylaxis 10/05/2013   Past Medical History  Diagnosis Date  . Fibromyalgia   . Chronic pain   . Seizures   . Migraine   . Pneumonia    History reviewed. No pertinent past surgical history. No family history on file. History   Social History  . Marital Status:  Single    Spouse Name: N/A    Number of Children: N/A  . Years of Education: N/A   Occupational History  . Not on file.   Social History Main Topics  . Smoking status: Never Smoker   . Smokeless tobacco: Never Used  . Alcohol Use: No  . Drug Use: No  . Sexual Activity: No   Other Topics Concern  . Not on file   Social History Narrative  . No narrative on file    Review of Systems: Pertinent items are noted in HPI.  Physical Exam: Blood pressure 133/81, pulse 81, temperature 100 F (37.8  C), temperature source Rectal, resp. rate 24, SpO2 100.00%. Physical Exam  Constitutional: She is oriented to person, place, and time and well-developed, well-nourished, and in no distress.  Nonrebreather in place, tachypneic  HENT:  Head: Normocephalic and atraumatic.  Eyes: Conjunctivae and EOM are normal. Pupils are equal, round, and reactive to light.  Neck: Normal range of motion. Neck supple.  Cardiovascular: Regular rhythm.  Tachycardia present.  Exam reveals no gallop and no friction rub.   No murmur heard. Pulmonary/Chest: Accessory muscle usage present. Tachypnea noted. She is in respiratory distress. She has no decreased breath sounds. She has no wheezes. She has no rhonchi. She has rales in the right lower field and the left lower field. She exhibits no tenderness.  Inspiratory crackles heard bilaterally on both posterior and anterior auscultation, loudest at the lung bases. Respiratory rate measured at 32 breaths per minute. Patient is talkative but has difficulty speaking in full sentences. She is using accessory muscles to breathe.  Abdominal: Soft. Bowel sounds are normal. She exhibits no distension. There is tenderness (some suprapubic tenderness to palpation).  Musculoskeletal: Normal range of motion. She exhibits no edema and no tenderness.  Lymphadenopathy:    She has no cervical adenopathy.  Neurological: She is alert and oriented to person, place, and time. No cranial  nerve deficit. GCS score is 15.  Skin: Skin is warm and dry.  Psychiatric: Affect normal.     Lab results: Basic Metabolic Panel:  Recent Labs  11/02/13 0035  NA 139  K 3.5*  CL 98  CO2 24  GLUCOSE 112*  BUN 13  CREATININE 0.67  CALCIUM 9.0   Liver Function Tests:  Recent Labs  11/02/13 0035  AST 25  ALT 31  ALKPHOS 143*  BILITOT 0.6  PROT 7.0  ALBUMIN 2.5*   Anion Gap 17  CBC:  Recent Labs  11/02/13 0035  WBC 32.7*  NEUTROABS 30.4*  HGB 10.8*  HCT 33.6*  MCV 89.1  PLT 310   Cardiac Enzymes:  Recent Labs  11/02/13 0247  TROPONINI <0.30   Urinalysis:  Recent Labs  11/02/13 0334  COLORURINE YELLOW  LABSPEC 1.020  PHURINE 6.0  GLUCOSEU NEGATIVE  HGBUR NEGATIVE  BILIRUBINUR MODERATE*  KETONESUR NEGATIVE  PROTEINUR NEGATIVE  UROBILINOGEN 0.2  NITRITE NEGATIVE  LEUKOCYTESUR NEGATIVE   ABG    Component Value Date/Time   PHART 7.519* 11/02/2013 0055   PCO2ART 30.8* 11/02/2013 0055   PO2ART 57.0* 11/02/2013 0055   HCO3 25.1* 11/02/2013 0055   TCO2 26 11/02/2013 0055   ACIDBASEDEF 1.0 10/17/2013 0952   O2SAT 92.0 11/02/2013 0055    Imaging results:  Dg Chest Port 1 View  11/02/2013   CLINICAL DATA:  Fever, shortness of breath  EXAM: PORTABLE CHEST - 1 VIEW  COMPARISON:  Prior chest x-ray 10/21/2013  FINDINGS: Stable cardiac and mediastinal contours. Increased left lower lobe patchy airspace opacity. Background diffuse interstitial prominence and bilateral central airway thickening is similar compared to prior. No pleural effusion or pneumothorax. No acute osseous abnormality.  IMPRESSION: Developing left basilar opacity concerning for and acute on chronic infectious or inflammatory process.  Persistent bilateral upper an mid lung irregular airspace opacities without significant interval change.   Electronically Signed   By: Jacqulynn Cadet M.D.   On: 11/02/2013 01:11    Other results: EKG: Pending  Assessment & Plan by Problem: [name] [name]  is a 53 y.o. [name] female with PMH fibromyalgia and ?seizure disorder with a recent admission for  CAP complicated by septic shock requiring ICU-level care (1/27-2/10/15) presents to the ED with recurrent dyspnea.  #Acute respiratory failure  - Patient presented in respiratory distress. Her ABG was significant for pH elevated at 7.52, PCO2 30.8, PO2 decreased at 57 suggesting primary respiratory alkalosis. She was satting well on a non-rebreather when we examined her. However she was tachypneic to the 30s. She had difficulty speaking in full sentences and was using accessory muscles to breathe. The sudden presentation of her symptoms along with report of pleuritic-type chest pain and a history of immobilization secondary to illness raises concern for PE. Her Wells score is moderate to high risk due to tachycardia, immobilization, and possible hemoptysis (brown sputum). She had a negative CTA on 10/15/2013, but nevertheless it will be important to rule out PE in this patient with a concerning presentation. - Admit to IMTS, step down - Repeat ABG, a rising CO2 will be concerning for the patient wearing out and we would need to proceed with PCCM consult at that time - N.p.o.  #HCAP with sepsis - Patient presents with fever to 102.3, tachycardia to the 140s, tachypnea. Lactate elevated at 2.88. Chest x-ray suggestive of developing left basilar opacity concerning for infectious or inflammatory process. She also has significant leukocytosis, but this is complicated by her prednisone use. - Continue IV fluids, normal saline at 125 cc per hour - Vancomycin per pharmacy - Ceftriaxone 1 g IV 3 times a day - Sputum culture - Blood cultures x2 - Legionella and strep pneumonia urine antigen - Respiratory virus panel - Droplet precautions - Cardiac monitoring - Repeat lactate in a.m.  #Pleuritic chest pain radiating to back - No history of CAD.  - PE workup as above - Trending troponins - 12-lead  EKG  #?Autoimmune disease - Last admit she was found to be ANA positive with titer of 1:80 and mildly p-ANCA positive with titer of 1:20. Myeloperoxidase was 1.2 (H); ESR 98, and CRP 23.8 RF, anti-DNA 1, cryoglobulin, ACE, c3/c4 were all within normal limits. TSH/T4 were low, likely due to her acute illness. 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 Vasculitis is less likely due to only mildly elevated p-ANCA, likely clinically insignificant. Outpatient rheum and pulm follow up had been planned. - Holding steroids for now given likely infectious process, she did receive a stress dose of Solu-Medrol in the ED - Consider rheumatology consult in the morning - Consider pulmonary consult in the morning  #?Seizure disorder - Patient has reported history of seizure disorder as a teenager, however neurology records provided by the patient last admit apparently mentioned prior migraine headaches but no seizures. EEG on 1/29 showed no evidence of seizure activity. She was started on Keppra last admit with no evidence of seizure activity.  - Continue Keppra 500 mg po twice a day  #Fibromyalgia and Chronic Pain Syndrome - Stable. - Holding home tramadol, pregabalin, Fentanyl patch, etodolac given respiratory distress  #Hypokalemia - K 3.5.  - IV potassium 40mq x2 - Checking magnesium - BMP in a.m.  #Suprapubic pain - Patient denies dysuria. Urine culture 10/26/2013 showed no growth. - Checking UA and urine culture   #Gential herpes - HSV 1 & 2 IgG were positive last admit. Denies recent flare. - Continue valacyclovir 1000 mg twice a day  #  Normocytic Anemia - Last admit patient was found to have Vit B12 and folate deficiencies in addition to high ferretin  indicating anemia of chronic disease. She was provided B12 and folate supplementation. Hemoglobin today is 10.8. - Continue folate 1 mg daily - Continue B12 supplements 2000 mcg daily - Continue vitamin D 50,000 units every 7 days, last dose was 11/01/13  #GERD - Continue protonix 40 mg twice a day  #DVT PPX - Heparin subcutaneous  Dispo: Disposition is deferred at this time, awaiting improvement of current medical problems. Anticipated discharge in approximately 1-3 day(s).   The patient does have a current PCP Ivin Poot, MD) and does need an [facility] hospital follow-up appointment after discharge.  The patient does not have transportation limitations that hinder transportation to clinic appointments.  Signed: Lesly Dukes, MD 11/02/2013, 4:28 AM

## 2013-11-02 NOTE — Consult Note (Signed)
PULMONARY / CRITICAL CARE MEDICINE  Name: [name] [name] MRN: [id number] DOB: 06-12-1961    ADMISSION DATE:  11/02/2013 CONSULTATION DATE:  11/02/13   REFERRING MD :  Graciella Freer  PRIMARY SERVICE: IMTS   CHIEF COMPLAINT:  Respiratory distress  BRIEF PATIENT DESCRIPTION: 53 yo [name] female recently admitted (1/27-2/10) with CAP / septic shock / suspected autoimmune disease readmitted 2/15 by IMTS for resp distress and fever.  SIGNIFICANT EVENTS / STUDIES:  1/28  TTE >>> EF 53%, grade 1 systolic dysfunction 6/64  Chest CT >>>  No pulmonary embolism, persistent upper lobe predominant bilateral airspace and ground-glass opacities, improved relative to most recent CT from 1/28, mediastinal and bilateral adenopathy is above, slightly decreased in size relative to prior.  LINES / TUBES:  CULTURES: 2/15 UC >> 2/15 BC >> 2/15 Viral panel >> 2/15 Sputum  >>  ANTIBIOTICS: Maxipime 2/15 >> Vanc 2/15 >>  HISTORY OF PRESENT ILLNESS:   [name] [name] is a 53 y.o. [name] female with PMH fibromyalgia, chronic pain  with a recent admission for CAP complicated by septic shock requiring ICU-level care (1/27-2/10/15) presents to the ED with recurrent dyspnea.  Patient reports she has been weak since discharge and developed fever, chills and increased dyspnea yesterday. Worse pain w/ pleuritic chest pain radiating to the back. She also had 2 episodes of nonbloody diarrhea today. Otherwise, she denies abdominal pain, nausea, vomiting, dysuria, hematuria.  On arrival to ER w/ fever 102.3, tachypnea and very weak.  . CT chest showed no PE ,  Persistent upper lobe predominant bilateral airspace and ground-glass opacities, improved relative to most recent CT from 1/20 8/1 5. Again, multi lobar pneumonia is favored. . Mediastinal and bilateral adenopathy is above, slightly decreased in size relative to prior. CXR showed left basilar opacity . WBC ~30K  In the ED she was given Solu-Medrol 125 mg IV x1, 1 L  normal saline bolus x3cefepime 2 g IV x1, vancomycin 1000 mg IV x1.   She denies tobacco, alcohol, illicit drugs. Smoked as teenage briefly. She moved here from [place] 3 months ago.  Last admission, patient presented with temp 103 with leukocytosis >40K and worsening bilateral interstitial and airspace opacities with some hilar and paratracheal adenopathy. Her O2 sats dropped to 80% in the ED and she was placed on BiPAP with no improvement in her respiratory status. She was subsequently intubated and transferred to the ICU. CTA was negative for PE. Bronchoscopy revealed no purulence in her airways, no DAH, no HSV lesions, and no mass lesions. Bronchoalveolar lavage was negative for pneumocystis, AFB, fungus, and group A strep, with negative respiratory virus panel. Rapid flu and HIV were negative and blood cultures showed no growth. She was treated with azithromycin from 1/27-1/29, vancomycin and zosyn from 1/27-1/30, ceftriaxone from 1/30-2/2, and tamiflu from 1/28-1/31. Due to concurrent hematuria, there was concern for vasculitis and she was also started on solumedrol 62m q6hrs. On 1/30, she developed hypotension and bradycardia requiring pressors for 24hrs. By 2/1, her respiratory status improved and she was able to be extubated. She was transferred to the floor on 2/4 and her O2 saturations remained >93% without any oxygen after that.  There was concern for  possible autoimmune disease: patient was started on solumedrol 880mq6hrs on 1/29 due to respiratory distress and hematuria with concern for vasculitis. She was found to be ANA positive with titer of 1:80 and mildly p-ANCA positive with titer of 1:20. Myeloperoxidase was 1.2 (H); ESR 98, and CRP  23.8 RF, anti-DNA 1, cryoglobulin, ACE, c3/c4 were all within normal limits. TSH/T4 were low, likely due to her acute illness. Patient reported history of painful red bumps on her bilateral lower extremity 6 months ago but did not see a physician, although  description is consistant with erythema nodosum. 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 Vasculitis is less likely due to only mildly elevated p-ANCA, likely clinically insignificant. Pt was set up for rheumatology clinic for further outpatient workup and pulmonary -has not been seen in OP setting . She was discharged on pred taper.   PAST MEDICAL HISTORY :  Past Medical History  Diagnosis Date  . Fibromyalgia   . Chronic pain   . Seizures   . Migraine   . Pneumonia    History reviewed. No pertinent past surgical history. Prior to Admission medications   Medication Sig Start Date End Date Taking? Authorizing Provider  Cyanocobalamin (B-12) 2000 MCG TABS Take 2,000 mcg by mouth daily. 10/28/13  Yes Michail Jewels, MD  etodolac (LODINE) 500 MG tablet Take 500 mg by mouth 2 (two) times daily.    Yes Historical Provider, MD  fentaNYL (DURAGESIC - DOSED MCG/HR) 50 MCG/HR Place 1 patch (50 mcg total) onto the skin every 3 (three) days. 10/10/13  Yes Jerene Pitch, MD  folic acid (FOLVITE) 1 MG tablet Take 1 tablet daily. 10/28/13  Yes Michail Jewels, MD  levETIRAcetam (KEPPRA) 500 MG tablet Take 1 tablet (500 mg total) by mouth 2 (two) times daily. 10/28/13  Yes Jessee Avers, MD  pantoprazole (PROTONIX) 40 MG tablet Take 40 mg by mouth 2 (two) times daily.   Yes Historical Provider, MD  polyethylene glycol (MIRALAX / GLYCOLAX) packet Take 17 g by mouth daily as needed for mild constipation or moderate constipation. 10/10/13  Yes Jerene Pitch, MD  potassium chloride (K-DUR) 10 MEQ tablet Take 1 tablet (10 mEq total) by mouth daily. 10/28/13  Yes Michail Jewels, MD  predniSONE (DELTASONE) 10 MG tablet Take 40 mg daily with breakfast for 3 days (10/29/2013 - 10/31/2013), then  Take 30 mg daily with breakfast for 3 days (2/14 -2/16), then  Take 20 mg daily with breakfast for 3 days (2/17-2/19) Then  Take 10 mg daily with breakfast for  3 days (2/10-2/20) and stop. 10/28/13  Yes Jessee Avers, MD  pregabalin (LYRICA) 300 MG capsule Take 1 capsule (300 mg total) by mouth 2 (two) times daily. 10/28/13  Yes Michail Jewels, MD  rizatriptan (MAXALT) 10 MG tablet Take 10 mg by mouth daily as needed for migraine. May repeat in 2 hours if needed   Yes Historical Provider, MD  traMADol (ULTRAM) 50 MG tablet Take 1 tablet (50 mg total) by mouth every 12 (twelve) hours as needed for moderate pain. 10/10/13  Yes Jerene Pitch, MD  valACYclovir (VALTREX) 1000 MG tablet Take 1 tablet (1,000 mg total) by mouth 2 (two) times daily. 10/28/13 11/03/13 Yes Jessee Avers, MD  Vitamin D, Ergocalciferol, (DRISDOL) 50000 UNITS CAPS capsule Take 50,000 Units by mouth every 7 (seven) days. Take on Saturday   Yes Historical Provider, MD   Allergies  Allergen Reactions  . Shellfish Allergy Anaphylaxis   FAMILY HISTORY:  No family history on file. SOCIAL HISTORY:  reports that she has never smoked. She has never used smokeless tobacco. She reports that she does not drink alcohol or use illicit drugs.  REVIEW OF SYSTEMS:   Constitutional:   No  weight loss, night  sweats,  + Fevers, chills, fatigue, or  lassitude.  HEENT:   No headaches,  Difficulty swallowing,  +Tooth/dental problems,  No   Sore throat,                No sneezing, itching, ear ache, nasal congestion, post nasal drip,   CV: +chest pain,   No orthopnea, PND, swelling in lower extremities, anasarca, dizziness, palpitations, syncope.   GI  No heartburn, indigestion, abdominal pain, nausea, vomiting, + diarrhea, change in bowel habits, loss of appetite   Resp: ++ shortness of breath with exertion or at rest.     No coughing up of blood.    No chest wall deformity  Skin: no rash or lesions.  GU: no dysuria, change in color of urine, no urgency or frequency.  No flank pain, no hematuria   MS:  +++diffuse muscle and joint pain   Psych:  No change in mood or affect. No depression  or anxiety.  No memory loss.  INTERVAL HISTORY:  VITAL SIGNS: Temp:  [98.1 F (36.7 C)-102.3 F (39.1 C)] 98.1 F (36.7 C) (02/15 0714) Pulse Rate:  [65-143] 65 (02/15 0714) Resp:  [16-30] 18 (02/15 0714) BP: (118-154)/(69-110) 154/110 mmHg (02/15 0714) SpO2:  [98 %-100 %] 100 % (02/15 0714) Weight:  [58 kg (127 lb 13.9 oz)] 58 kg (127 lb 13.9 oz) (02/15 0500) HEMODYNAMICS:        INTAKE / OUTPUT: Intake/Output     02/14 0701 - 02/15 0700 02/15 0701 - 02/16 0700   I.V. (mL/kg) 125 (2.2)    IV Piggyback 200    Total Intake(mL/kg) 325 (5.6)    Urine (mL/kg/hr) 450    Total Output 450     Net -125            PHYSICAL EXAMINATION: General:  Frail female in bed  Neuro:  Alert, anxious  HEENT: poor dentition , dry mucosa  Cardiovascular: ST , no m/r/g  Lungs:  Bibasilar crackles, increased accessory use  Abdomen: soft, BS + , NT , no guarding or rebound  Musculoskeletal:  gen weakness, maew, no joint deformity noted.  Skin:  Intact w/out rash   LABS:  CBC  Recent Labs Lab 10/28/13 0546 11/02/13 0035  WBC 9.4 32.7*  HGB 9.6* 10.8*  HCT 31.3* 33.6*  PLT 367 310   Coag's No results found for this basename: APTT, INR,  in the last 168 hours  BMET  Recent Labs Lab 10/28/13 0546 11/02/13 0035 11/02/13 0838  NA 145 139 145  K 3.5* 3.5* 4.1  CL 108 98 109  CO2 '24 24 23  ' BUN '8 13 9  ' CREATININE 0.80 0.67 0.52  GLUCOSE 92 112* 117*   Electrolytes  Recent Labs Lab 10/28/13 0546 11/02/13 0035 11/02/13 0838  CALCIUM 7.9* 9.0 8.1*  MG  --   --  1.6   Sepsis Markers  Recent Labs Lab 11/02/13 0047 11/02/13 0248 11/02/13 0838  LATICACIDVEN 2.88*  --  1.9  PROCALCITON  --  0.97  --    ABG  Recent Labs Lab 11/02/13 0055  PHART 7.519*  PCO2ART 30.8*  PO2ART 57.0*   Liver Enzymes  Recent Labs Lab 11/02/13 0035  AST 25  ALT 31  ALKPHOS 143*  BILITOT 0.6  ALBUMIN 2.5*   Cardiac Enzymes  Recent Labs Lab 11/02/13 0247 11/02/13 0838   TROPONINI <0.30 <0.30   Glucose  Recent Labs Lab 10/26/13 1707 10/26/13 2152 10/27/13 0159 10/27/13 0407 10/27/13 0752 10/27/13  1215  GLUCAP 136* 105* 99 95 83 142*   Imaging Ct Angio Chest Pe W/cm &/or Wo Cm  11/02/2013   CLINICAL DATA:  Worsening shortness of breath  EXAM: CT ANGIOGRAPHY CHEST WITH CONTRAST  TECHNIQUE: Multidetector CT imaging of the chest was performed using the standard protocol during bolus administration of intravenous contrast. Multiplanar CT image reconstructions and MIPs were obtained to evaluate the vascular anatomy.  CONTRAST:  160m OMNIPAQUE IOHEXOL 350 MG/ML SOLN  COMPARISON:  Prior radiograph from earlier the same day.  FINDINGS: Study is degraded by motion artifact.  The thyroid is normal.  Mediastinal and hilar the adenopathy again seen, slightly improved relative to the prior study. Index prevascular node measures 3.1 x 1.5 cm, previously 3.6 x 2.5 cm, image 95. Right paratracheal node measures approximately 2.1 x 1.4 cm, previously 3.0 x 1.7 cm. Enlarged subcarinal and bilateral hilar adenopathy is slightly improved.  Heart size within normal limits.  Trace pericardial fluid noted.  Evaluation of the pulmonary arteries is somewhat limited due to motion. No filling defects are seen to suggest acute pulmonary embolism. Re-formatted imaging confirms these findings.  Multi focal ground-glass opacities are again seen involving the lungs bilaterally, greatest within the upper lobes. Overall, these opacities are slightly improved. There is involvement of the right middle lobe and lingula, similar to prior. No pulmonary edema or pleural effusion.  Visualized upper abdomen is within normal limits.  No acute osseous abnormality.  IMPRESSION: 1. No evidence of acute pulmonary embolism. 2. Persistent upper lobe predominant bilateral airspace and ground-glass opacities, improved relative to most recent CT from 1/20 8/1 5. Again, multi lobar pneumonia is favored. 3.  Mediastinal and bilateral adenopathy is above, slightly decreased in size relative to prior.   Electronically Signed   By: BJeannine BogaM.D.   On: 11/02/2013 05:04   Dg Chest Port 1 View  11/02/2013   CLINICAL DATA:  Fever, shortness of breath  EXAM: PORTABLE CHEST - 1 VIEW  COMPARISON:  Prior chest x-ray 10/21/2013  FINDINGS: Stable cardiac and mediastinal contours. Increased left lower lobe patchy airspace opacity. Background diffuse interstitial prominence and bilateral central airway thickening is similar compared to prior. No pleural effusion or pneumothorax. No acute osseous abnormality.  IMPRESSION: Developing left basilar opacity concerning for and acute on chronic infectious or inflammatory process.  Persistent bilateral upper an mid lung irregular airspace opacities without significant interval change.   Electronically Signed   By: HJacqulynn CadetM.D.   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 Less likely pneumonia ( HCAP vs atypical ), as PCT = 0.97  -->  Goal SpO2>92 -->  Supplemental oxygen PRN -->  Pulse dose steroids ( Solu-Medrol 250 q6h x 3 days ) -->  Agree with Cefepime / vancomycin -->  Add Levaquin for atypical coverage -->  Add SSI as at risk for steroid induced hyperglycemia -->  Should consider VATS Bx  PARRETT,TAMMY NP -C  Pulmonary and CRancho MiragePager: [phone] 11/02/2013, 9:57 AM  I have personally obtained history, examined patient, evaluated and interpreted laboratory and imaging results, reviewed medical records, formulated assessment / plan and placed orders.  ZDoree Fudge MD Pulmonary and CKennedyPager: [phone] 11/02/2013, 2:32 PM

## 2013-11-03 ENCOUNTER — Inpatient Hospital Stay (HOSPITAL_COMMUNITY): Payer: Medicaid Other

## 2013-11-03 DIAGNOSIS — R071 Chest pain on breathing: Secondary | ICD-10-CM

## 2013-11-03 DIAGNOSIS — B009 Herpesviral infection, unspecified: Secondary | ICD-10-CM

## 2013-11-03 DIAGNOSIS — R509 Fever, unspecified: Secondary | ICD-10-CM

## 2013-11-03 DIAGNOSIS — J9600 Acute respiratory failure, unspecified whether with hypoxia or hypercapnia: Secondary | ICD-10-CM

## 2013-11-03 DIAGNOSIS — M549 Dorsalgia, unspecified: Secondary | ICD-10-CM

## 2013-11-03 DIAGNOSIS — I369 Nonrheumatic tricuspid valve disorder, unspecified: Secondary | ICD-10-CM

## 2013-11-03 LAB — CBC
HCT: 30.9 % — ABNORMAL LOW (ref 36.0–46.0)
HEMATOCRIT: 29 % — AB (ref 36.0–46.0)
HEMOGLOBIN: 9.2 g/dL — AB (ref 12.0–15.0)
Hemoglobin: 9.7 g/dL — ABNORMAL LOW (ref 12.0–15.0)
MCH: 28 pg (ref 26.0–34.0)
MCH: 27.8 pg (ref 26.0–34.0)
MCHC: 31.4 g/dL (ref 30.0–36.0)
MCHC: 31.7 g/dL (ref 30.0–36.0)
MCV: 89.3 fL (ref 78.0–100.0)
MCV: 87.6 fL (ref 78.0–100.0)
Platelets: 223 10*3/uL (ref 150–400)
Platelets: 231 10*3/uL (ref 150–400)
RBC: 3.46 MIL/uL — ABNORMAL LOW (ref 3.87–5.11)
RBC: 3.31 MIL/uL — ABNORMAL LOW (ref 3.87–5.11)
RDW: 17.7 % — AB (ref 11.5–15.5)
RDW: 17.4 % — ABNORMAL HIGH (ref 11.5–15.5)
WBC: 9.7 10*3/uL (ref 4.0–10.5)
WBC: 8.9 10*3/uL (ref 4.0–10.5)

## 2013-11-03 LAB — TROPONIN I
Troponin I: <0.3 ng/mL (ref <0.30)
Troponin I: <0.3 ng/mL (ref <0.30)

## 2013-11-03 LAB — BASIC METABOLIC PANEL
BUN: 15 mg/dL (ref 6–23)
CALCIUM: 8.7 mg/dL (ref 8.4–10.5)
CO2: 23 meq/L (ref 19–32)
CREATININE: 0.57 mg/dL (ref 0.50–1.10)
Chloride: 105 mEq/L (ref 96–112)
GFR calc Af Amer: >90 mL/min (ref >90)
GFR calc non Af Amer: >90 mL/min (ref >90)
Glucose, Bld: 128 mg/dL — ABNORMAL HIGH (ref 70–99)
Potassium: 3.9 mEq/L (ref 3.7–5.3)
Sodium: 141 mEq/L (ref 137–147)

## 2013-11-03 LAB — URINE CULTURE
CULTURE: NO GROWTH
Colony Count: NO GROWTH

## 2013-11-03 LAB — RESPIRATORY VIRUS PANEL
Adenovirus: NOT DETECTED
INFLUENZA A H3: NOT DETECTED
INFLUENZA A: NOT DETECTED
INFLUENZA B 1: NOT DETECTED
Influenza A H1: NOT DETECTED
METAPNEUMOVIRUS: NOT DETECTED
PARAINFLUENZA 1 A: NOT DETECTED
Parainfluenza 2: NOT DETECTED
Parainfluenza 3: NOT DETECTED
RESPIRATORY SYNCYTIAL VIRUS A: NOT DETECTED
Respiratory Syncytial Virus B: NOT DETECTED
Rhinovirus: NOT DETECTED

## 2013-11-03 LAB — GLUCOSE, CAPILLARY
GLUCOSE-CAPILLARY: 117 mg/dL — AB (ref 70–99)
GLUCOSE-CAPILLARY: 123 mg/dL — AB (ref 70–99)
GLUCOSE-CAPILLARY: 146 mg/dL — AB (ref 70–99)
GLUCOSE-CAPILLARY: 158 mg/dL — AB (ref 70–99)
Glucose-Capillary: 191 mg/dL — ABNORMAL HIGH (ref 70–99)

## 2013-11-03 LAB — LEGIONELLA ANTIGEN, URINE: LEGIONELLA ANTIGEN, URINE: NEGATIVE

## 2013-11-03 LAB — LACTIC ACID, PLASMA: Lactic Acid, Venous: 1.3 mmol/L (ref 0.5–2.2)

## 2013-11-03 LAB — CORTISOL: Cortisol, Plasma: 9.9 ug/dL

## 2013-11-03 LAB — PROCALCITONIN: PROCALCITONIN: 0.69 ng/mL

## 2013-11-03 MED ORDER — FUROSEMIDE 10 MG/ML IJ SOLN
20.0000 mg | Freq: Once | INTRAMUSCULAR | Status: AC
  Administered 2013-11-03: 20 mg via INTRAVENOUS

## 2013-11-03 MED ORDER — INSULIN ASPART 100 UNIT/ML ~~LOC~~ SOLN
0.0000 [IU] | Freq: Three times a day (TID) | SUBCUTANEOUS | Status: DC
  Administered 2013-11-04: 1 [IU] via SUBCUTANEOUS
  Administered 2013-11-04: 2 [IU] via SUBCUTANEOUS

## 2013-11-03 MED ORDER — TRAMADOL HCL 50 MG PO TABS
50.0000 mg | ORAL_TABLET | Freq: Three times a day (TID) | ORAL | Status: DC | PRN
  Administered 2013-11-04 – 2013-11-05 (×2): 50 mg via ORAL
  Filled 2013-11-03 (×3): qty 1

## 2013-11-03 MED ORDER — PREDNISONE 20 MG PO TABS
30.0000 mg | ORAL_TABLET | Freq: Every day | ORAL | Status: DC
  Administered 2013-11-04 – 2013-11-06 (×3): 30 mg via ORAL
  Filled 2013-11-03 (×4): qty 1

## 2013-11-03 NOTE — Progress Notes (Addendum)
Patient seen and examined in conjunction with resident team and I agree with exam, assessment and plan.  Still unclear cause with subjective SOB, no fever, ground glass opacities.  Appreciate CCM help.  She is now on oral steroids and to wean off.  There is no signs of infection so will stop antibiotics. Respiratory panel pending.  I agree with CCM that heart failure is a possible course.  BNP notably elevated.  Work up for rheumatic causes unimpressive with minimally elevated P ANCA.  ? Malignancy.  Previous echo does show diastolic dysfunction with moderate tricuspid regurgitation.  Repeated today.    Staci RighterOMER, Shawana Knoch, MD

## 2013-11-03 NOTE — Evaluation (Signed)
Physical Therapy Evaluation Patient Details Name: [name] [name] MRN: [id number] DOB: 1961-07-04 Today's Date: 11/03/2013 Time: 4098-1191 PT Time Calculation (min): 25 min  PT Assessment / Plan / Recommendation History of Present Illness  [name] [name] is a 53 y.o. [place] female with PMH fibromyalgia, chronic pain  with a recent admission for CAP complicated by septic shock requiring ICU-level care (1/27-2/10/15) presents to the ED with recurrent dyspnea  Clinical Impression  Pt very pleasant and frustrated with recent readmissions. Pt reports desire to progress medically and physically to be able to care for herself and decrease fall risk. Pt reports frequent falls and slow gait confirms high fall risk. Pt with decreased functional mobility, gait and below deficits who will benefit from acute therapy to maximize mobility, and independence to decrease burden of care.     PT Assessment  Patient needs continued PT services    Follow Up Recommendations  Home health PT;Supervision/Assistance - 24 hour    Does the patient have the potential to tolerate intense rehabilitation      Barriers to Discharge        Equipment Recommendations  None recommended by PT    Recommendations for Other Services     Frequency Min 3X/week    Precautions / Restrictions Precautions Precautions: Fall   Pertinent Vitals/Pain HR 79 sats 92-100% on RA throughout Back pain, chronic 4/10 repositioned      Mobility  Bed Mobility Overal bed mobility: Modified Independent Transfers Overall transfer level: Needs assistance Equipment used: Rolling walker (2 wheeled) Transfers: Sit to/from Stand Sit to Stand: Supervision Stand pivot transfers: Supervision General transfer comment: Cues for hand placement. Ambulation/Gait Ambulation/Gait assistance: Supervision Ambulation Distance (Feet): 180 Feet Assistive device: Rolling walker (2 wheeled) Gait Pattern/deviations: Step-through pattern;Decreased  stride length;Narrow base of support Gait velocity: 38ft/60sec= .33 Gait velocity interpretation: <1.8 ft/sec, indicative of risk for recurrent falls General Gait Details: cues for position in RW and to increase stride length as well as directional cues    Exercises     PT Diagnosis: Difficulty walking;Generalized weakness  PT Problem List: Decreased strength;Decreased activity tolerance;Decreased balance;Decreased mobility;Decreased knowledge of use of DME PT Treatment Interventions: DME instruction;Gait training;Stair training;Functional mobility training;Therapeutic activities;Therapeutic exercise;Balance training;Patient/family education     PT Goals(Current goals can be found in the care plan section) Acute Rehab PT Goals Patient Stated Goal: be able to walk further PT Goal Formulation: With patient Time For Goal Achievement: 11/17/13 Potential to Achieve Goals: Good  Visit Information  Last PT Received On: 11/03/13 Assistance Needed: +1 History of Present Illness: [name] [name] is a 53 y.o. [place] female with PMH fibromyalgia, chronic pain  with a recent admission for CAP complicated by septic shock requiring ICU-level care (1/27-2/10/15) presents to the ED with recurrent dyspnea       Prior Functioning  Home Living Family/patient expects to be discharged to:: Private residence Living Arrangements: Parent Available Help at Discharge: Family;Available 24 hours/day Type of Home: House Home Access: Stairs to enter [employer] of Steps: 1 Entrance Stairs-Rails: None Home Layout: One level Home Equipment: Cane - single point;Walker - 2 wheels Prior Function Level of Independence: Needs assistance Gait / Transfers Assistance Needed: using RW PTA with frequent falls per mother, family has been walking her to bathroom and in house ADL's / Homemaking Assistance Needed: Pt reports she has been able to perform BADLs but with frequent falls   Communication Communication: No difficulties    Cognition  Cognition Arousal/Alertness: Awake/alert Behavior During Therapy: [facility]  for tasks assessed/performed Overall Cognitive Status: Within Functional Limits for tasks assessed    Extremity/Trunk Assessment Upper Extremity Assessment Upper Extremity Assessment: Defer to OT evaluation Lower Extremity Assessment Lower Extremity Assessment: Generalized weakness Cervical / Trunk Assessment Cervical / Trunk Assessment: Normal   Balance    End of Session PT - End of Session Activity Tolerance: Patient tolerated treatment well Patient left: in chair;with call bell/phone within reach Nurse Communication: Mobility status  GP     [name], Patsie Mccardle [facility] County HospitalBeth 11/03/2013, 12:41 PM Delaney MeigsMaija Tabor Barry Faircloth, PT [phone]

## 2013-11-03 NOTE — Evaluation (Signed)
Occupational Therapy Evaluation Patient Details Name: [name] [name] MRN: [id number] DOB: [birth year] Today's Date: 11/03/2013 Time: 4098-1191 OT Time Calculation (min): 21 min  OT Assessment / Plan / Recommendation History of present illness [name] [name] is a 53 y.o. [place] female with PMH fibromyalgia, chronic pain  with a recent admission for CAP complicated by septic shock requiring ICU-level care (1/27-2/10/15) presents to the ED with recurrent dyspnea   Clinical Impression   Pt admitted with above. She demonstrates the below listed deficits and will benefit from continued OT to maximize safety and independence with BADLs.  Pt fatigues with BADLs requiring min A overall.  She reports she has 24 hour assist as needed.  Recommend HHOT     OT Assessment  Patient needs continued OT Services    Follow Up Recommendations  Home health OT;Supervision/Assistance - 24 hour    Barriers to Discharge      Equipment Recommendations  None recommended by OT    Recommendations for Other Services    Frequency  Min 2X/week    Precautions / Restrictions Precautions Precautions: Fall Precaution Comments: Pt reports she has had no falls since using RW after last admission   Pertinent Vitals/Pain     ADL  Eating/Feeding: Independent Where Assessed - Eating/Feeding: Bed level Grooming: Wash/dry hands;Wash/dry face;Brushing hair;Minimal assistance Where Assessed - Grooming: Supported standing Upper Body Bathing: Minimal assistance Where Assessed - Upper Body Bathing: Unsupported sitting Lower Body Bathing: Minimal assistance Where Assessed - Lower Body Bathing: Unsupported sit to stand Upper Body Dressing: Set up Where Assessed - Upper Body Dressing: Unsupported sitting Lower Body Dressing: Minimal assistance Where Assessed - Lower Body Dressing: Unsupported sit to stand Toilet Transfer: Minimal assistance (HHA) Toilet Transfer Method: Sit to stand Toilet Transfer Equipment: Grab  bars;Regular height toilet Toileting - Clothing Manipulation and Hygiene: Minimal assistance Where Assessed - Glass blower/designer Manipulation and Hygiene: Standing Transfers/Ambulation Related to ADLs: HHA ADL Comments: Pt fatigues quickly with activity    OT Diagnosis: Generalized weakness  OT Problem List: Decreased strength;Decreased activity tolerance;Impaired balance (sitting and/or standing);Decreased coordination;Decreased cognition;Decreased safety awareness;Decreased knowledge of use of DME or AE OT Treatment Interventions: Self-care/ADL training;DME and/or AE instruction;Therapeutic activities;Patient/family education;Balance training   OT Goals(Current goals can be found in the care plan section) Acute Rehab OT Goals Patient Stated Goal: To get stronger OT Goal Formulation: With patient Time For Goal Achievement: 11/17/13 Potential to Achieve Goals: Good ADL Goals Pt Will Perform Grooming: with supervision;standing Pt Will Perform Upper Body Bathing: with supervision;sitting Pt Will Perform Lower Body Bathing: with supervision;sit to/from stand Pt Will Perform Upper Body Dressing: with supervision;sitting Pt Will Perform Lower Body Dressing: with supervision;sit to/from stand Pt Will Transfer to Toilet: with supervision;ambulating;regular height toilet;bedside commode;grab bars Pt Will Perform Tub/Shower Transfer: with supervision;rolling walker;ambulating;shower seat  Visit Information  Last OT Received On: 11/03/13 Assistance Needed: +1 History of Present Illness: [name] [name] is a 53 y.o. [place] female with PMH fibromyalgia, chronic pain  with a recent admission for CAP complicated by septic shock requiring ICU-level care (1/27-2/10/15) presents to the ED with recurrent dyspnea       Prior Functioning     Home Living Family/patient expects to be discharged to:: Private residence Living Arrangements: Parent Available Help at Discharge: Family;Available 24  hours/day Type of Home: House Home Access: Stairs to enter [employer] of Steps: 1 Entrance Stairs-Rails: None Home Layout: One level Home Equipment: Cane - single point;Walker - 2 wheels;Shower seat Prior Function Level of Independence:  Needs assistance Gait / Transfers Assistance Needed: using RW PTA with frequent falls per mother, family has been walking her to bathroom and in house ADL's / Homemaking Assistance Needed: Pt reports she fatigues with showering and sister assists her Communication Communication: No difficulties Dominant Hand: Right         Vision/Perception     Cognition  Cognition Arousal/Alertness: Awake/alert Behavior During Therapy: WFL for tasks assessed/performed Overall Cognitive Status: Within Functional Limits for tasks assessed    Extremity/Trunk Assessment Upper Extremity Assessment Upper Extremity Assessment: Generalized weakness RUE Deficits / Details: tremor noted bil. UEs LUE Deficits / Details: tremor noted bil. UEs Lower Extremity Assessment Lower Extremity Assessment: Defer to PT evaluation Cervical / Trunk Assessment Cervical / Trunk Assessment: Normal     Mobility Bed Mobility Overal bed mobility: Modified Independent Transfers Overall transfer level: Needs assistance Equipment used: 1 person hand held assist Transfers: Sit to/from Stand Sit to Stand: Min assist Stand pivot transfers: Min assist General transfer comment: Cues for hand placement.     Exercise     Balance Balance Overall balance assessment: Needs assistance Sitting-balance support: Feet supported;No upper extremity supported Sitting balance-Leahy Scale: Good Standing balance support: Single extremity supported Standing balance-Leahy Scale: Fair   End of Session OT - End of Session Activity Tolerance: Patient limited by fatigue Patient left: in bed;with call bell/phone within reach  GO     [name] [name] M 11/03/2013, 3:49 PM

## 2013-11-03 NOTE — Progress Notes (Signed)
Speech Pathology Note  Pt is known to me from prior two admissions. After mental status improved pt has no overt evidence of aspiration other than a previously diagnosed esophageal dysphagia. Given ongoing concern for aspiration based on clinical presentation, recommend skipping subjective eval and completing an MBS to objectively evaluate pt for silent aspiration or signs of aspiration from reflux. Pt to go to MBS at 1300 today. Harlon DittyBonnie Gesselle Fitzsimons, MA CCC-SLP [phone]

## 2013-11-03 NOTE — Progress Notes (Signed)
Subjective:   Pt reports feeling better this AM.   Objective:   Vital signs in last 24 hours: Filed Vitals:   11/03/13 1700 11/03/13 1800 11/03/13 1900 11/03/13 2008  BP:      Pulse: 65 66 109   Temp:    98.3 F (36.8 C)  TempSrc:    Oral  Resp: '28 17 24   ' Height:      Weight:      SpO2: 100% 98% 100%    Weight change:   Intake/Output Summary (Last 24 hours) at 11/03/13 2055 Last data filed at 11/03/13 1857  Gross per 24 hour  Intake   1320 ml  Output   2750 ml  Net  -1430 ml    Physical Exam: Constitutional: Vital signs reviewed.  Patient is in no acute distress and cooperative with exam.   Head: Normocephalic and atraumatic Eyes: PERRL, EOMI, conjunctivae normal, no scleral icterus  Neck: Supple, Trachea midline Cardiovascular: RRR, S1, S2 present, no MRG, DP 2+b/l Pulmonary/Chest: normal respiratory effort, diffuse mild crackles, no wheezes, rales, or rhonchi Abdominal: Soft. +BS, Non-tender, non-distended Neurological: A&O x3, cranial nerve II-XII are grossly intact, moving all extremities  Skin: Warm, dry and intact. No rash Psychiatric: Normal mood and affect.   Lab Results:  BMP:  Recent Labs Lab 11/02/13 0838 11/03/13 0350  NA 145 141  K 4.1 3.9  CL 109 105  CO2 23 23  GLUCOSE 117* 128*  BUN 9 15  CREATININE 0.52 0.57  CALCIUM 8.1* 8.7  MG 1.6  --     CBC:  Recent Labs Lab 11/02/13 0035 11/03/13 0350  WBC 32.7* 9.7  NEUTROABS 30.4*  --   HGB 10.8* 9.7*  HCT 33.6* 30.9*  MCV 89.1 89.3  PLT 310 223    Coagulation: No results found for this basename: LABPROT, INR,  in the last 168 hours  CBG:            Recent Labs Lab 11/02/13 2017 11/02/13 2322 11/03/13 0319 11/03/13 0724 11/03/13 1120 11/03/13 1548  GLUCAP 105* 155* 117* 123* 191* 146*           HA1C:      No results found for this basename: HGBA1C,  in the last 168 hours  Lipid Panel: No results found for this basename: CHOL, HDL, LDLCALC, TRIG, CHOLHDL,  LDLDIRECT,  in the last 168 hours  LFTs:  Recent Labs Lab 11/02/13 0035  AST 25  ALT 31  ALKPHOS 143*  BILITOT 0.6  PROT 7.0  ALBUMIN 2.5*    Pancreatic Enzymes: No results found for this basename: LIPASE, AMYLASE,  in the last 168 hours  Ammonia: No results found for this basename: AMMONIA,  in the last 168 hours  Cardiac Enzymes:  Recent Labs Lab 11/03/13 0925 11/03/13 1620 11/03/13 2015  TROPONINI <0.30 <0.30 <0.30   Lab Results  Component Value Date   CKTOTAL 31 10/25/2013   TROPONINI <0.30 11/03/2013    EKG:  Date/Time:    Ventricular Rate:    PR Interval:    QRS Duration:   QT Interval:    QTC Calculation:   R Axis:     Text Interpretation:     BNP:  Recent Labs Lab 11/02/13 1450  PROBNP 3533.0*    D-Dimer: No results found for this basename: DDIMER,  in the last 168 hours  Urinalysis:  Recent Labs Lab 11/02/13 0334  COLORURINE YELLOW  LABSPEC 1.020  PHURINE 6.0  GLUCOSEU NEGATIVE  HGBUR NEGATIVE  BILIRUBINUR MODERATE*  KETONESUR NEGATIVE  PROTEINUR NEGATIVE  UROBILINOGEN 0.2  NITRITE NEGATIVE  LEUKOCYTESUR NEGATIVE    Micro Results: Recent Results (from the past 240 hour(s))  URINE CULTURE     Status: None   Collection Time    10/26/13  8:36 AM      Result Value Ref Range Status   Specimen Description URINE, RANDOM   Final   Special Requests NONE   Final   Culture  Setup Time     Final   Value: 10/26/2013 18:01     Performed at [facility]     Final   Value: NO GROWTH     Performed at [facility]   Culture     Final   Value: NO GROWTH     Performed at [facility]   Report Status 10/27/2013 FINAL   Final  CULTURE, BLOOD (ROUTINE X 2)     Status: None   Collection Time    11/02/13 12:35 AM      Result Value Ref Range Status   Specimen Description BLOOD RIGHT ANTECUBITAL   Final   Special Requests BOTTLES DRAWN AEROBIC ONLY 10CC   Final   Culture  Setup Time     Final    Value: 11/02/2013 14:36     Performed at [facility]   Culture     Final   Value:        BLOOD CULTURE RECEIVED NO GROWTH TO DATE CULTURE WILL BE HELD FOR 5 DAYS BEFORE ISSUING A FINAL NEGATIVE REPORT     Performed at [facility]   Report Status PENDING   Incomplete  CULTURE, BLOOD (ROUTINE X 2)     Status: None   Collection Time    11/02/13 12:40 AM      Result Value Ref Range Status   Specimen Description BLOOD ARM LEFT   Final   Special Requests BOTTLES DRAWN AEROBIC AND ANAEROBIC 5CC   Final   Culture  Setup Time     Final   Value: 11/02/2013 14:36     Performed at [facility]   Culture     Final   Value:        BLOOD CULTURE RECEIVED NO GROWTH TO DATE CULTURE WILL BE HELD FOR 5 DAYS BEFORE ISSUING A FINAL NEGATIVE REPORT     Performed at [facility]   Report Status PENDING   Incomplete  URINE CULTURE     Status: None   Collection Time    11/02/13  3:34 AM      Result Value Ref Range Status   Specimen Description URINE, CLEAN CATCH   Final   Special Requests NONE   Final   Culture  Setup Time     Final   Value: 11/02/2013 14:53     Performed at [facility]     Final   Value: NO GROWTH     Performed at [facility]   Culture     Final   Value: NO GROWTH     Performed at [facility]   Report Status 11/03/2013 FINAL   Final  MRSA PCR SCREENING     Status: None   Collection Time    11/02/13  5:44 AM      Result Value Ref Range Status   MRSA by PCR NEGATIVE  NEGATIVE Final   Comment:  The GeneXpert MRSA Assay (FDA     approved for NASAL specimens     only), is one component of a     comprehensive MRSA colonization     surveillance program. It is not     intended to diagnose MRSA     infection nor to guide or     monitor treatment for     MRSA infections.  RESPIRATORY VIRUS PANEL     Status: None   Collection Time    11/02/13  7:37 AM      Result Value Ref Range Status   Source -  RVPAN NASAL SWAB   Corrected   Comment: CORRECTED ON 02/16 AT 1808: PREVIOUSLY REPORTED AS NASAL SWAB   Respiratory Syncytial Virus A NOT DETECTED   Final   Respiratory Syncytial Virus B NOT DETECTED   Final   Influenza A NOT DETECTED   Final   Influenza B NOT DETECTED   Final   Parainfluenza 1 NOT DETECTED   Final   Parainfluenza 2 NOT DETECTED   Final   Parainfluenza 3 NOT DETECTED   Final   Metapneumovirus NOT DETECTED   Final   Rhinovirus NOT DETECTED   Final   Adenovirus NOT DETECTED   Final   Influenza A H1 NOT DETECTED   Final   Influenza A H3 NOT DETECTED   Final   Comment: (NOTE)           Normal Reference Range for each Analyte: NOT DETECTED     Testing performed using the Luminex xTAG Respiratory Viral Panel test     kit.     This test was developed and its performance characteristics determined     by [facility]. It has not been cleared or approved by the [place]     Food and Drug Administration. This test is used for clinical purposes.     It should not be regarded as investigational or for research. This     laboratory is certified under the [place] (CLIA) as qualified to perform high complexity     clinical laboratory testing.     Performed at [facility]    Blood Culture:    Component Value Date/Time   SDES URINE, RANDOM 11/02/2013 0552   SPECREQUEST NONE 11/02/2013 0552   CULT  Value: NO GROWTH Performed at [facility] 11/02/2013 0334   REPTSTATUS 11/03/2013 FINAL 11/02/2013 0552    Studies/Results: Ct Angio Chest Pe W/cm &/or Wo Cm  11/02/2013   CLINICAL DATA:  Worsening shortness of breath  EXAM: CT ANGIOGRAPHY CHEST WITH CONTRAST  TECHNIQUE: Multidetector CT imaging of the chest was performed using the standard protocol during bolus administration of intravenous contrast. Multiplanar CT image reconstructions and MIPs were obtained to evaluate the vascular anatomy.  CONTRAST:  169m OMNIPAQUE  IOHEXOL 350 MG/ML SOLN  COMPARISON:  Prior radiograph from earlier the same day.  FINDINGS: Study is degraded by motion artifact.  The thyroid is normal.  Mediastinal and hilar the adenopathy again seen, slightly improved relative to the prior study. Index prevascular node measures 3.1 x 1.5 cm, previously 3.6 x 2.5 cm, image 95. Right paratracheal node measures approximately 2.1 x 1.4 cm, previously 3.0 x 1.7 cm. Enlarged subcarinal and bilateral hilar adenopathy is slightly improved.  Heart size within normal limits.  Trace pericardial fluid noted.  Evaluation of the pulmonary arteries is somewhat limited due to motion. No filling defects are  seen to suggest acute pulmonary embolism. Re-formatted imaging confirms these findings.  Multi focal ground-glass opacities are again seen involving the lungs bilaterally, greatest within the upper lobes. Overall, these opacities are slightly improved. There is involvement of the right middle lobe and lingula, similar to prior. No pulmonary edema or pleural effusion.  Visualized upper abdomen is within normal limits.  No acute osseous abnormality.  IMPRESSION: 1. No evidence of acute pulmonary embolism. 2. Persistent upper lobe predominant bilateral airspace and ground-glass opacities, improved relative to most recent CT from 1/20 8/1 5. Again, multi lobar pneumonia is favored. 3. Mediastinal and bilateral adenopathy is above, slightly decreased in size relative to prior.   Electronically Signed   By: Jeannine Boga M.D.   On: 11/02/2013 05:04   Dg Chest Port 1 View  11/03/2013   CLINICAL DATA:  Pneumonia  EXAM: PORTABLE CHEST - 1 VIEW  COMPARISON:  Prior CT and radiograph from 11/02/2013  FINDINGS: The cardiac and mediastinal silhouettes are stable in size and contour, and remain within normal limits.  Lungs are normally inflated. Again seen are multi focal parenchymal infiltrates involving the bilateral upper lobes and left lower lobe, compatible with pneumonia.  Overall, these are not significantly changed. No new focal infiltrate. No pulmonary edema or pleural effusion. No pneumothorax.  Osseous structures are unchanged.  IMPRESSION: No significant interval change and bilateral upper and midlung irregular airspace opacities with more confluent left lower lobe opacity. These findings may be infectious or inflammatory in nature.   Electronically Signed   By: Jeannine Boga M.D.   On: 11/03/2013 06:03   Dg Chest Port 1 View  11/02/2013   CLINICAL DATA:  Fever, shortness of breath  EXAM: PORTABLE CHEST - 1 VIEW  COMPARISON:  Prior chest x-ray 10/21/2013  FINDINGS: Stable cardiac and mediastinal contours. Increased left lower lobe patchy airspace opacity. Background diffuse interstitial prominence and bilateral central airway thickening is similar compared to prior. No pleural effusion or pneumothorax. No acute osseous abnormality.  IMPRESSION: Developing left basilar opacity concerning for and acute on chronic infectious or inflammatory process.  Persistent bilateral upper an mid lung irregular airspace opacities without significant interval change.   Electronically Signed   By: Jacqulynn Cadet M.D.   On: 11/02/2013 01:11   Dg Swallowing Func-speech Pathology  11/03/2013   Katherene Ponto [name], CCC-SLP     11/03/2013  2:11 PM Objective Swallowing Evaluation: Modified Barium Swallowing Study   Patient Details  Name: [name] [name] MRN: [id number] Date of Birth: Feb 04, 1961  Today's Date: 11/03/2013 Time: 1310-1340 SLP Time Calculation (min): 30 min  Past Medical History:  Past Medical History  Diagnosis Date  . Fibromyalgia   . Chronic pain   . Seizures   . Migraine   . Pneumonia    Past Surgical History: History reviewed. No pertinent past  surgical history. HPI: Sapir Lavey is a 53 y.o. [name] female with PMH  fibromyalgia, chronic pain with a recent admission for CAP  complicated by septic shock requiring ICU-level care  (1/27-2/10/15) presents to the ED with  recurrent dyspnea       Assessment / Plan / Recommendation Clinical Impression  Dysphagia Diagnosis: Mild pharyngeal phase dysphagia Clinical impression: Pt presents with a mild oropharygneal  dysphagia. Pt has a slight delay in swallow initiation which  causes episodes of trace penetration/aspiration with sensation  when bolus size with thin liquids is overlarge (straw sips and  when trying to take pills). The pt does not fully expel  penetrate  or aspirate with cough. Pt typically does not use straws and  takes careful small sips at baseline. It is unlikely that  infrequent trace aspiration is impacting her pulmonary function  based on findings of today's study. Provided education to pt  regarding limiting bolus size, avoiding straws and taking larger  pills in puree. No f/u therapy or diet modification is needed.     Treatment Recommendation  No treatment recommended at this time    Diet Recommendation Regular;Thin liquid   Liquid Administration via: Cup;No straw Medication Administration: Whole meds with puree Supervision: Patient able to self feed;Intermittent supervision  to cue for compensatory strategies Compensations: Slow rate;Small sips/bites;Follow solids with  liquid Postural Changes and/or Swallow Maneuvers: Seated upright 90  degrees    Other  Recommendations Oral Care Recommendations: Oral care Q4  per protocol;Oral care BID   Follow Up Recommendations  None    Frequency and Duration        Pertinent Vitals/Pain NA    SLP Swallow Goals     General HPI: 53 yo female with recent admission for CAP  re-presents with fever, leukocytosis, chest pain, and hypoxia in  setting of possible seizure vs pre-syncope. Intubated from 1/27  to 2/1. Type of Study: Modified Barium Swallowing Study Reason for Referral: Objectively evaluate swallowing function Previous Swallow Assessment: BSE 1/22 - suspected esopahgeal  dysphagia. Rec Dys 3/thin with esophageal precautions.  Diet Prior to this Study: Dysphagia 1 (puree)  Temperature Spikes Noted: No Respiratory Status: Nasal cannula History of Recent Intubation: No Behavior/Cognition: Alert;Cooperative;Pleasant mood Oral Cavity - Dentition: Adequate natural dentition Self-Feeding Abilities: Able to feed self Patient Positioning: Upright in chair Baseline Vocal Quality: Clear Volitional Cough: Strong Volitional Swallow: Able to elicit    Reason for Referral Objectively evaluate swallowing function   Oral Phase Oral Preparation/Oral Phase Oral Phase: WFL   Pharyngeal Phase Pharyngeal Phase Pharyngeal Phase: Impaired Pharyngeal - Thin Pharyngeal - Thin Cup: Within functional limits Pharyngeal - Thin Straw: Penetration/Aspiration before  swallow;Trace aspiration;Delayed swallow initiation Penetration/Aspiration details (thin straw): Material enters  airway, CONTACTS cords and not ejected out (despite cough  response) Pharyngeal - Solids Pharyngeal - Puree: Within functional limits Pharyngeal - Regular: Within functional limits Pharyngeal - Pill: Delayed swallow  initiation;Penetration/Aspiration before swallow;Trace aspiration Penetration/Aspiration details (pill): Material enters airway,  passes BELOW cords and not ejected out despite cough attempt by  patient  Cervical Esophageal Phase    GO    Cervical Esophageal Phase Cervical Esophageal Phase: WFL (Esophageal sweep did not reveal  any abnormality. No radiolog)        Herbie Baltimore, MA CCC-SLP [phone]  Othelia Pulling Katherene Ponto 11/03/2013, 2:07 PM     Medications:  Scheduled Meds: . etodolac  500 mg Oral BID  . fentaNYL  50 mcg Transdermal Q72H  . folic acid  1 mg Oral Daily  . heparin  5,000 Units Subcutaneous 3 times per day  . insulin aspart  1-3 Units Subcutaneous 6 times per day  . levETIRAcetam  500 mg Oral BID  . methylPREDNISolone (SOLU-MEDROL) injection  250 mg Intravenous Q6H  . pantoprazole  40 mg Oral BID  . [START ON 11/04/2013] predniSONE  30 mg Oral Q breakfast  . pregabalin  300 mg Oral BID  . traMADol   50 mg Oral Q12H  . vitamin B-12  2,000 mcg Oral Daily   Continuous Infusions:  PRN Meds:.SUMAtriptan  Antibiotics: Anti-infectives   Start     Dose/Rate Route Frequency Ordered Stop  11/02/13 1730  levofloxacin (LEVAQUIN) IVPB 750 mg  Status:  Discontinued     750 mg 100 mL/hr over 90 Minutes Intravenous Every 24 hours 11/02/13 1625 11/03/13 1013   11/02/13 1200  vancomycin (VANCOCIN) IVPB 750 mg/150 ml premix  Status:  Discontinued     750 mg 150 mL/hr over 60 Minutes Intravenous Every 12 hours 11/02/13 0602 11/03/13 1013   11/02/13 1000  valACYclovir (VALTREX) tablet 1,000 mg  Status:  Discontinued     1,000 mg Oral 2 times daily 11/02/13 0324 11/03/13 1013   11/02/13 0600  ceFEPIme (MAXIPIME) 1 g in dextrose 5 % 50 mL IVPB  Status:  Discontinued     1 g 100 mL/hr over 30 Minutes Intravenous 3 times per day 11/02/13 0537 11/03/13 1013   11/02/13 0045  ceFEPIme (MAXIPIME) 2 g in dextrose 5 % 50 mL IVPB     2 g 100 mL/hr over 30 Minutes Intravenous  Once 11/02/13 0035 11/02/13 0123   11/02/13 0045  vancomycin (VANCOCIN) IVPB 1000 mg/200 mL premix     1,000 mg 200 mL/hr over 60 Minutes Intravenous  Once 11/02/13 0035 11/02/13 0211     Antibiotics Given (last 72 hours)   Date/Time Action Medication Dose Rate   11/02/13 1052 Given   valACYclovir (VALTREX) tablet 1,000 mg 1,000 mg    11/02/13 1358 Given   ceFEPIme (MAXIPIME) 1 g in dextrose 5 % 50 mL IVPB 1 g 100 mL/hr   11/02/13 1439 Given   vancomycin (VANCOCIN) IVPB 750 mg/150 ml premix 750 mg 150 mL/hr   11/02/13 1729 Given   levofloxacin (LEVAQUIN) IVPB 750 mg 750 mg 100 mL/hr   11/02/13 2151 Given   valACYclovir (VALTREX) tablet 1,000 mg 1,000 mg    11/02/13 2152 Given   ceFEPIme (MAXIPIME) 1 g in dextrose 5 % 50 mL IVPB 1 g 100 mL/hr   11/02/13 2348 Given   vancomycin (VANCOCIN) IVPB 750 mg/150 ml premix 750 mg 150 mL/hr   11/03/13 0631 Given   ceFEPIme (MAXIPIME) 1 g in dextrose 5 % 50 mL IVPB 1 g 100 mL/hr       Day of Hospitalization:  1 Consults:    Assessment/Plan:   Principal Problem:   Acute respiratory failure with hypoxia Active Problems:   Sepsis   Chronic pain   Seizure disorder   Fibromyalgia   Anemia of chronic disease   Severe sepsis(995.92)   HCAP (healthcare-associated pneumonia)   Connective tissue disease   ILD (interstitial lung disease)   Acute respiratory failure  #Acute respiratory failure - Resolved; PCCM consulted and recommend continued high dose steroids.    -appreciate PCCM recs -transfer out of SDU tomorrow  #HCAP with sepsis - Resolved. Pt has been treated adequately for pneumonia in her previous 2 hospitalizations--this is her third for the same admission.  Respiratory virus panel negative.  BCx x2 NGTD. Legionella/strep pneumonia negative.  -d/c vancomycin and ceftriaxone -Sputum culture pending -d/c droplet precautions  - cardiac monitoring    #Pleuritic chest pain radiating to back - Resolved. No history of CAD. Troponins x 3 negative.    #?Autoimmune disease - Last admit she was found to be ANA positive with titer of 1:80 and mildly p-ANCA positive with titer of 1:20. Myeloperoxidase was 1.2 (H); ESR 98, and CRP 23.8 RF, anti-DNA 1, cryoglobulin, ACE, c3/c4 were all within normal limits. TSH/T4 were low, likely due to her acute illness. Patient reported history of painful red bumps on her bilateral lower extremity  6 months ago but did not see a physician, although description is consistant with erythema nodosum. 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 Vasculitis is less likely due to only mildly elevated p-ANCA, likely clinically insignificant. Outpatient rheum and pulm follow up had been planned.  - continue high dose steroids  #?Seizure disorder -  - Continue Keppra 500 mg po twice a day   #Fibromyalgia and Chronic Pain Syndrome - Stable.  - Holding home tramadol, pregabalin,  Fentanyl patch, etodolac given respiratory distress   #Hypokalemia - Resolved. K 3.5.   #Suprapubic pain - Patient denies dysuria. Urine culture 10/26/2013 showed no growth.  - Checking UA and urine culture   #Gential herpes - Resolved. Treated during last hospitalization.  HSV 1 & 2 IgG were positive last admit. Denies recent flare.   #Normocytic Anemia - Last admit patient was found to have Vit B12 and folate deficiencies in addition to high ferretin indicating anemia of chronic disease. She was provided B12 and folate supplementation. Hemoglobin today is 10.8.  - Continue folate 1 mg daily  - Continue B12 supplements 2000 mcg daily   #Vitamin D deficiency - -Continue vitamin D 50,000 units every 7 days, last dose was 11/01/13   #GERD - Continue protonix 40 mg twice a day   #DVT PPX - Heparin subcutaneous  #Dispo- Disposition is deferred at this time, awaiting improvement of current medical problems.  Anticipated discharge in approximately 1-2 day(s).    LOS: 1 day   Michail Jewels, MD PGY-1, Internal Medicine  [phone] (7AM-5PM Mon-Fri) 11/03/2013, 8:55 PM

## 2013-11-03 NOTE — Progress Notes (Signed)
RT spoke to patient about wearing Bipap.  Patient stated that she didn't want to try to wear Bipap at this time.  Patient vitals are WNL and the patient states that she "feels safe with just the oxygen right now".  RT advised the pt that if she changed her mind about wearing Bipap she could contact the RT.  RT will continue to monitor.

## 2013-11-03 NOTE — Consult Note (Signed)
PULMONARY / CRITICAL CARE MEDICINE  Name: [name] [name] MRN: [id number] DOB: October 28, 1960    ADMISSION DATE:  11/02/2013 CONSULTATION DATE:  11/02/13   REFERRING MD :  Graciella Freer  PRIMARY SERVICE: IMTS   CHIEF COMPLAINT:  Respiratory distress  BRIEF PATIENT DESCRIPTION:   [name] [name] is a 53 y.o. [name] female with PMH fibromyalgia, chronic pain  with a recent admission for CAP complicated by septic shock requiring ICU-level care (1/27-2/10/15) presents to the ED with recurrent dyspnea.   Patient reports she has been weak since discharge and developed fever, chills and increased dyspnea yesterday. Worse pain w/ pleuritic chest pain radiating to the back. She also had 2 episodes of nonbloody diarrhea today. Otherwise, she denies abdominal pain, nausea, vomiting, dysuria, hematuria.  On arrival to ER w/ fever 102.3, tachypnea and very weak.  . CT chest showed no PE ,   Persistent upper lobe predominant bilateral airspace and ground-glass opacities, improved relative to most recent CT from 1/20 8/1 5. Again, multi lobar pneumonia is favored. . Mediastinal and bilateral adenopathy is above, slightly decreased in size relative to prior. CXR showed left basilar opacity . WBC ~30K  In the ED she was given Solu-Medrol 125 mg IV x1, 1 L normal saline bolus x3cefepime 2 g IV x1, vancomycin 1000 mg IV x1.   She denies tobacco, alcohol, illicit drugs. Smoked as teenage briefly.   She moved here from [place] 3 months ago.  Last admission,10/14/13  patient presented with temp 103 with leukocytosis >40K and worsening bilateral interstitial and airspace opacities with some hilar and paratracheal adenopathy. Her O2 sats dropped to 80% in the ED and she was placed on BiPAP with no improvement in her respiratory status. She was subsequently intubated and transferred to the ICU. CTA was negative for PE. Bronchoscopy revealed no purulence in her airways, no DAH, no HSV lesions, and no mass lesions. Bronchoalveolar  lavage was negative for pneumocystis, AFB, fungus, and group A strep, with negative respiratory virus panel. Rapid flu and HIV were negative and blood cultures showed no growth. She was treated with azithromycin from 1/27-1/29, vancomycin and zosyn from 1/27-1/30, ceftriaxone from 1/30-2/2, and tamiflu from 1/28-1/31. Due to concurrent hematuria, there was concern for vasculitis and she was also started on solumedrol 33m q6hrs. On 1/30, she developed hypotension and bradycardia requiring pressors for 24hrs. By 2/1, her respiratory status improved and she was able to be extubated. She was transferred to the floor on 2/4 and her O2 saturations remained >93% without any oxygen after that.   There was concern for  possible autoimmune disease: patient was started on solumedrol 850mq6hrs on 1/29 due to respiratory distress and hematuria with concern for vasculitis. She was found to be ANA positive with titer of 1:80 and mildly p-ANCA positive with titer of 1:20. Myeloperoxidase was 1.2 (H); ESR 98, and CRP 23.8 RF, anti-DNA 1, cryoglobulin, ACE, c3/c4 were all within normal limits. TSH/T4 were low, likely due to her acute illness. 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 Vasculitis is less likely due to only mildly elevated p-ANCA, likely clinically insignificant. Pt was set up for rheumatology clinic for further outpatient workup and pulmonary -has not been seen in OP setting . She was discharged on pred taper.   SIGNIFICANT EVENTS /  STUDIES:  10/14/13 - ADMIT 1/281/28 Abd [place] >>No hydronephrosis, Left kidney 39m stone?, Right kidney 646mstone? Mild intra and extrahepatic biliary ductal prominence,  1/28 CTA chest >>> No PE. Bilateral prominently upper lobe mixed airspace  and ground-glass opacities, multi lobar pneumonia is favored. Mediastinal and bilateral hilar adenopathy  1/28 Bronchoscopy >>> unimpressive secretions, no DAH, no hsv lesions, no mass lesions, BAL sent.  1/28 TTE >>> EF 65-70%, grade 1 DD, PA peak 38, no change from prior  10/15/13 BRONCH BAL - 93% POLYS (Ddx is acute infection, AIP, aspiration) 1/28 CT head >>> Negative for acute process.  1/29 EEG >>> no evidence of seizure activity  1/31 2. 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 Rest negative. Resp VIrus PCR and culture - negative 2/4 off precedex overnight, improved orientation   TTE >>> EF 6519%grade 1 systolic dysfunction 2/11/22/88- DC on 8 day pred taper. Did NOT go  Home on oxygen .................... Marland KitchenEADMIT 11/02/2013 2/15  Chest CT >>>  No pulmonary embolism, persistent upper lobe predominant bilateral airspace and ground-glass opacities, improved relative to most recent CT from 1/28, mediastinal and bilateral adenopathy is above, slightly decreased in size relative to prior.  LINES / TUBES:  CULTURES: 2/15 UC >> 2/15 BC >> 2/15 Viral panel >> 2/15 Sputum  >>  ANTIBIOTICS: Maxipime 2/15 >> Vanc 2/15 >>    INTERVAL HISTORY:  11/03/13: Frustrated she did not get better and dyspnea got worse. SHe was on prednisone taper at home. CT did show improvement though compared to end Jan 2015 and no PE. BNP new high. Patient on 2L [place] but RN says she is fine on RA but needs it for subjective comfort   VITAL SIGNS: Temp:  [97.8 F (36.6 C)-98.7 F (37.1 C)] 98.7 F (37.1 C) (02/16 0724) Pulse Rate:  [54-91] 91 (02/16 0900) Resp:  [15-31] 30 (02/16 0900) BP: (93-158)/(69-81) 117/69 mmHg (02/16 0724) SpO2:  [94 %-100 %] 98 % (02/16 0900) HEMODYNAMICS:        INTAKE / OUTPUT: Intake/Output     02/15 0701 - 02/16 0700 02/16 0701  - 02/17 0700   P.O. 490    I.V. (mL/kg)     IV Piggyback 600    Total Intake(mL/kg) 1090 (18.8)    Urine (mL/kg/hr) 1000 (0.7) 150 (0.7)   Total Output 1000 150   Net +90 -150        Urine Occurrence 1 x      PHYSICAL EXAMINATION: General:  Frail female in bed  Neuro:  Alert, anxious  HEENT: poor dentition , dry mucosa  Cardiovascular: ST , no m/r/g  Lungs: Anxious. Normal WOB. Some crackles esp bilateral UL Abdomen: soft, BS + , NT , no guarding or rebound  Musculoskeletal:  gen weakness, maew, no joint deformity noted.  Skin:  Intact w/out rash   LABS:  CBC  Recent Labs Lab 10/28/13 0546 11/02/13 0035 11/03/13 0350  WBC 9.4 32.7* 9.7  HGB 9.6* 10.8* 9.7*  HCT 31.3* 33.6* 30.9*  PLT 367 310 223   Coag's No results found for this basename: APTT, INR,  in the last 168 hours  BMET  Recent Labs Lab 11/02/13 0035 11/02/13 0838 11/03/13 0350  NA 139 145 141  K 3.5* 4.1 3.9  CL 98 109 105  CO2 24 23  23  BUN '13 9 15  ' CREATININE 0.67 0.52 0.57  GLUCOSE 112* 117* 128*   Electrolytes  Recent Labs Lab 11/02/13 0035 11/02/13 0838 11/03/13 0350  CALCIUM 9.0 8.1* 8.7  MG  --  1.6  --    Sepsis Markers  Recent Labs Lab 11/02/13 0047 11/02/13 0248 11/02/13 0838 11/03/13 0350  LATICACIDVEN 2.88*  --  1.9 1.3  PROCALCITON  --  0.97  --   --    ABG  Recent Labs Lab 11/02/13 0055  PHART 7.519*  PCO2ART 30.8*  PO2ART 57.0*   Liver Enzymes  Recent Labs Lab 11/02/13 0035  AST 25  ALT 31  ALKPHOS 143*  BILITOT 0.6  ALBUMIN 2.5*   Cardiac Enzymes  Recent Labs Lab 11/02/13 1450 11/02/13 2038 11/03/13 0350  TROPONINI <0.30 <0.30 <0.30  PROBNP 3533.0*  --   --    Glucose  Recent Labs Lab 10/27/13 1215 11/02/13 1708 11/02/13 2017 11/02/13 2322 11/03/13 0319 11/03/13 0724  GLUCAP 142* 97 105* 155* 117* 123*   Imaging Ct Angio Chest Pe W/cm &/or Wo Cm  11/02/2013   CLINICAL DATA:  Worsening shortness of breath  EXAM: CT  ANGIOGRAPHY CHEST WITH CONTRAST  TECHNIQUE: Multidetector CT imaging of the chest was performed using the standard protocol during bolus administration of intravenous contrast. Multiplanar CT image reconstructions and MIPs were obtained to evaluate the vascular anatomy.  CONTRAST:  180m OMNIPAQUE IOHEXOL 350 MG/ML SOLN  COMPARISON:  Prior radiograph from earlier the same day.  FINDINGS: Study is degraded by motion artifact.  The thyroid is normal.  Mediastinal and hilar the adenopathy again seen, slightly improved relative to the prior study. Index prevascular node measures 3.1 x 1.5 cm, previously 3.6 x 2.5 cm, image 95. Right paratracheal node measures approximately 2.1 x 1.4 cm, previously 3.0 x 1.7 cm. Enlarged subcarinal and bilateral hilar adenopathy is slightly improved.  Heart size within normal limits.  Trace pericardial fluid noted.  Evaluation of the pulmonary arteries is somewhat limited due to motion. No filling defects are seen to suggest acute pulmonary embolism. Re-formatted imaging confirms these findings.  Multi focal ground-glass opacities are again seen involving the lungs bilaterally, greatest within the upper lobes. Overall, these opacities are slightly improved. There is involvement of the right middle lobe and lingula, similar to prior. No pulmonary edema or pleural effusion.  Visualized upper abdomen is within normal limits.  No acute osseous abnormality.  IMPRESSION: 1. No evidence of acute pulmonary embolism. 2. Persistent upper lobe predominant bilateral airspace and ground-glass opacities, improved relative to most recent CT from 1/20 8/1 5. Again, multi lobar pneumonia is favored. 3. Mediastinal and bilateral adenopathy is above, slightly decreased in size relative to prior.   Electronically Signed   By: BJeannine BogaM.D.   On: 11/02/2013 05:04   Dg Chest Port 1 View  11/03/2013   CLINICAL DATA:  Pneumonia  EXAM: PORTABLE CHEST - 1 VIEW  COMPARISON:  Prior CT and radiograph  from 11/02/2013  FINDINGS: The cardiac and mediastinal silhouettes are stable in size and contour, and remain within normal limits.  Lungs are normally inflated. Again seen are multi focal parenchymal infiltrates involving the bilateral upper lobes and left lower lobe, compatible with pneumonia. Overall, these are not significantly changed. No new focal infiltrate. No pulmonary edema or pleural effusion. No pneumothorax.  Osseous structures are unchanged.  IMPRESSION: No significant interval change and bilateral upper and midlung irregular airspace opacities with more confluent left lower lobe  opacity. These findings may be infectious or inflammatory in nature.   Electronically Signed   By: Jeannine Boga M.D.   On: 11/03/2013 06:03   Dg Chest Port 1 View  11/02/2013   CLINICAL DATA:  Fever, shortness of breath  EXAM: PORTABLE CHEST - 1 VIEW  COMPARISON:  Prior chest x-ray 10/21/2013  FINDINGS: Stable cardiac and mediastinal contours. Increased left lower lobe patchy airspace opacity. Background diffuse interstitial prominence and bilateral central airway thickening is similar compared to prior. No pleural effusion or pneumothorax. No acute osseous abnormality.  IMPRESSION: Developing left basilar opacity concerning for and acute on chronic infectious or inflammatory process.  Persistent bilateral upper an mid lung irregular airspace opacities without significant interval change.   Electronically Signed   By: Jacqulynn Cadet M.D.   On: 11/02/2013 01:11   ASSESSMENT / PLAN:  Acute hypoxemic respiratory failure with pulmonary infiltrates esp Bilateral UL and some mediastinal node Possible Connective tissue disease, NOS  - - Originally Bilateral UL ILD seen 10/15/13 in setting of acute critical illness. BAL showed polys and this is c/w AIP, acute infection or aspiration. Discharged 10/26/13 on 12 day prednisone taper on Room Air but  returned 11/02/2013 with dyspnea (not hypoxemic this admit and using 2L  for subjective reasons). She has improved infiltrates this admission 11/02/13 on CT chest again. She has new high BNP   - Thre is no clear cut evidence of autoimmune disease. Hx of erythema nodosum raises suspicion for Sarcoid but this is not treated with steroids. Autoimmune biomarkers are trace positive only    - Wondering if diastolic or new onset acute systolic CHF and deconditioning or HAP or UTI is playing a role in current admission  PLAN - Check ABG on RA  - Based on above and no emergent need to treat with steroids: Dc solumedrol. Changed to prednisone and wean over next few to several days   - significant risk for medical complications with steroids - Would prefer to biopsy objective lesions off steroids if and when they arise - if lungs get worse or Erythema Nodosum gets worse - Continue antibiotics; check PCT  - SLP eval for aspiration - IF all etiologies negative consider AIP (Acute Interstitial Pneumonitis = Idiopathic Acute Lung Injury) as etiology - Get ECHO and give empiric lasix  - Consult PT (she is deconditioned)    Dr. Brand Males, M.D., [facility].C.P Pulmonary and Critical Care Medicine Staff Physician [facility] Pager: [phone], If no answer or between  15:00h - 7:00h: call [phone]  11/03/2013 11:01 AM

## 2013-11-03 NOTE — Progress Notes (Signed)
Medical Student Daily Progress Note  Subjective: Patient feels better this morning, denies SOB. Reports chills overnight. Would prefer to have lung biopsy during this admission but not until she's feeling better.   Objective: Vital signs in last 24 hours: Filed Vitals:   11/03/13 0800 11/03/13 0900 11/03/13 1122 11/03/13 1237  BP:   112/50   Pulse: 75 91 71 74  Temp:   98.5 F (36.9 C)   TempSrc:   Oral   Resp: '21 30 18   ' Height:      Weight:      SpO2: 94% 98% 98% 97%   Weight change:   Intake/Output Summary (Last 24 hours) at 11/03/13 1250 Last data filed at 11/03/13 0900  Gross per 24 hour  Intake    850 ml  Output   1150 ml  Net   -300 ml   Physical Exam: Constitutional: Alert, in no acute distress and cooperative with exam.  Eyes: PERRLA, conjunctivae normal, no scleral icterus  Neck: Supple, Trachea midline  Cardiovascular: RRR, S1, S2 present, no murmurs DP 2+ b/l,  Pulmonary/Chest: normal respiratory effort, CTAB, no wheezes, rales, or rhonchi  Abdominal: Soft, nondistended, +suprapubic tenderness, +BS Neurological: Alert and responsive and able to follow commands; cranial nerves II-XII are grossly intact, moving all extremities.  GU: bilateral labial lesions with open sores  Skin: Warm, dry and intact.  Extremities: 2+ pitting edema bilaterally  Psychiatric: Normal mood and affect.   Micro Results: Recent Results (from the past 240 hour(s))  URINE CULTURE     Status: None   Collection Time    10/26/13  8:36 AM      Result Value Ref Range Status   Specimen Description URINE, RANDOM   Final   Special Requests NONE   Final   Culture  Setup Time     Final   Value: 10/26/2013 18:01     Performed at [facility]     Final   Value: NO GROWTH     Performed at [facility]   Culture     Final   Value: NO GROWTH     Performed at [facility]   Report Status 10/27/2013 FINAL   Final  CULTURE, BLOOD (ROUTINE X 2)      Status: None   Collection Time    11/02/13 12:35 AM      Result Value Ref Range Status   Specimen Description BLOOD RIGHT ANTECUBITAL   Final   Special Requests BOTTLES DRAWN AEROBIC ONLY 10CC   Final   Culture  Setup Time     Final   Value: 11/02/2013 14:36     Performed at [facility]   Culture     Final   Value:        BLOOD CULTURE RECEIVED NO GROWTH TO DATE CULTURE WILL BE HELD FOR 5 DAYS BEFORE ISSUING A FINAL NEGATIVE REPORT     Performed at [facility]   Report Status PENDING   Incomplete  CULTURE, BLOOD (ROUTINE X 2)     Status: None   Collection Time    11/02/13 12:40 AM      Result Value Ref Range Status   Specimen Description BLOOD ARM LEFT   Final   Special Requests BOTTLES DRAWN AEROBIC AND ANAEROBIC 5CC   Final   Culture  Setup Time     Final   Value: 11/02/2013 14:36     Performed at [facility]  Culture     Final   Value:        BLOOD CULTURE RECEIVED NO GROWTH TO DATE CULTURE WILL BE HELD FOR 5 DAYS BEFORE ISSUING A FINAL NEGATIVE REPORT     Performed at [facility]   Report Status PENDING   Incomplete  URINE CULTURE     Status: None   Collection Time    11/02/13  3:34 AM      Result Value Ref Range Status   Specimen Description URINE, CLEAN CATCH   Final   Special Requests NONE   Final   Culture  Setup Time     Final   Value: 11/02/2013 14:53     Performed at [facility]     Final   Value: NO GROWTH     Performed at [facility]   Culture     Final   Value: NO GROWTH     Performed at [facility]   Report Status 11/03/2013 FINAL   Final  MRSA PCR SCREENING     Status: None   Collection Time    11/02/13  5:44 AM      Result Value Ref Range Status   MRSA by PCR NEGATIVE  NEGATIVE Final   Comment:            The GeneXpert MRSA Assay (FDA     approved for NASAL specimens     only), is one component of a     comprehensive MRSA colonization     surveillance program. It is not      intended to diagnose MRSA     infection nor to guide or     monitor treatment for     MRSA infections.   Studies/Results: Ct Angio Chest Pe W/cm &/or Wo Cm  11/02/2013   CLINICAL DATA:  Worsening shortness of breath  EXAM: CT ANGIOGRAPHY CHEST WITH CONTRAST  TECHNIQUE: Multidetector CT imaging of the chest was performed using the standard protocol during bolus administration of intravenous contrast. Multiplanar CT image reconstructions and MIPs were obtained to evaluate the vascular anatomy.  CONTRAST:  156m OMNIPAQUE IOHEXOL 350 MG/ML SOLN  COMPARISON:  Prior radiograph from earlier the same day.  FINDINGS: Study is degraded by motion artifact.  The thyroid is normal.  Mediastinal and hilar the adenopathy again seen, slightly improved relative to the prior study. Index prevascular node measures 3.1 x 1.5 cm, previously 3.6 x 2.5 cm, image 95. Right paratracheal node measures approximately 2.1 x 1.4 cm, previously 3.0 x 1.7 cm. Enlarged subcarinal and bilateral hilar adenopathy is slightly improved.  Heart size within normal limits.  Trace pericardial fluid noted.  Evaluation of the pulmonary arteries is somewhat limited due to motion. No filling defects are seen to suggest acute pulmonary embolism. Re-formatted imaging confirms these findings.  Multi focal ground-glass opacities are again seen involving the lungs bilaterally, greatest within the upper lobes. Overall, these opacities are slightly improved. There is involvement of the right middle lobe and lingula, similar to prior. No pulmonary edema or pleural effusion.  Visualized upper abdomen is within normal limits.  No acute osseous abnormality.  IMPRESSION: 1. No evidence of acute pulmonary embolism. 2. Persistent upper lobe predominant bilateral airspace and ground-glass opacities, improved relative to most recent CT from 1/20 8/1 5. Again, multi lobar pneumonia is favored. 3. Mediastinal and bilateral adenopathy is above, slightly decreased in  size relative to prior.   Electronically Signed  By: Jeannine Boga M.D.   On: 11/02/2013 05:04   Dg Chest Port 1 View  11/03/2013   CLINICAL DATA:  Pneumonia  EXAM: PORTABLE CHEST - 1 VIEW  COMPARISON:  Prior CT and radiograph from 11/02/2013  FINDINGS: The cardiac and mediastinal silhouettes are stable in size and contour, and remain within normal limits.  Lungs are normally inflated. Again seen are multi focal parenchymal infiltrates involving the bilateral upper lobes and left lower lobe, compatible with pneumonia. Overall, these are not significantly changed. No new focal infiltrate. No pulmonary edema or pleural effusion. No pneumothorax.  Osseous structures are unchanged.  IMPRESSION: No significant interval change and bilateral upper and midlung irregular airspace opacities with more confluent left lower lobe opacity. These findings may be infectious or inflammatory in nature.   Electronically Signed   By: Jeannine Boga M.D.   On: 11/03/2013 06:03   Dg Chest Port 1 View  11/02/2013   CLINICAL DATA:  Fever, shortness of breath  EXAM: PORTABLE CHEST - 1 VIEW  COMPARISON:  Prior chest x-ray 10/21/2013  FINDINGS: Stable cardiac and mediastinal contours. Increased left lower lobe patchy airspace opacity. Background diffuse interstitial prominence and bilateral central airway thickening is similar compared to prior. No pleural effusion or pneumothorax. No acute osseous abnormality.  IMPRESSION: Developing left basilar opacity concerning for and acute on chronic infectious or inflammatory process.  Persistent bilateral upper an mid lung irregular airspace opacities without significant interval change.   Electronically Signed   By: Jacqulynn Cadet M.D.   On: 11/02/2013 01:11   Medications: I have reviewed the patient's current medications. Scheduled Meds: . etodolac  500 mg Oral BID  . fentaNYL  50 mcg Transdermal Q72H  . folic acid  1 mg Oral Daily  . heparin  5,000 Units Subcutaneous 3  times per day  . insulin aspart  1-3 Units Subcutaneous 6 times per day  . levETIRAcetam  500 mg Oral BID  . methylPREDNISolone (SOLU-MEDROL) injection  250 mg Intravenous Q6H  . pantoprazole  40 mg Oral BID  . [START ON 11/04/2013] predniSONE  30 mg Oral Q breakfast  . pregabalin  300 mg Oral BID  . traMADol  50 mg Oral Q12H  . vitamin B-12  2,000 mcg Oral Daily   Continuous Infusions:   PRN Meds:.SUMAtriptan Assessment/Plan: Principal Problem:   Acute respiratory failure with hypoxia Active Problems:   Sepsis   Chronic pain   Seizure disorder   Fibromyalgia   Anemia of chronic disease   Severe sepsis(995.92)   HCAP (healthcare-associated pneumonia)   Connective tissue disease   ILD (interstitial lung disease)   Acute respiratory failure   LOS: 1 day   [name] [name] is a 53 y.o. [name] female with PMH fibromyalgia and ?seizure disorder with a recent admission for CAP complicated by septic shock requiring ICU-level care (1/27-2/10/15) who presented to the ED with recurrent dyspnea, improving.  #Acute respiratory failure - Patient presented in respiratory distress with ABG suggestive of primary respiratory alkalosis. Currently on 2L [place]. Did not pick up her steroids from the pharmacy until 3 days after they were prescribed. Unlikely an infectious etiology as patient is afebrile without leukocytosis and previously responded to steroids. No PE on CT angiogram. CXR shows increased left lower lobe patchy airspace opacity. CT shows somewhat improved mediastinal and bilateral adenopathy. -solumedrol 239m q6 - pulse dose steroids (d/c after today) -prednisone taper beginning tomorrow -titrate O2 to O2 sats > 92% -pulmonology critical care consulted, appreciate  recs -d/c antibiotics  -PT/OT consults   #?Autoimmune Disease - p-ANCA positive; myeloperoxidase 1.2 (H); ESR 98 (but would expect this to be high in acute illness and is non-specific); ANA positive; c3/c4 negative; RF 13  (wnl); anti-DNA 1 (negative); CRP 23.8 (H); cryoglobulin negative; TSH/T4 low; ANA titer 1:80; p-ANCA titer 1:20 (nl=<1:20). CXR with evidence of hilar adenopathy, concerning for sarcoid.  Normal ACE level.  ?history of erythema nodsum. Family history of lupus in maternal grandmother. -consider lung biopsy -solumedrol 264m q6 followed by prednisone taper as above  -patient should follow up with rheumatology as outpatient   #Hyperglycemia: likely due to high dose steroids -continue current insulin regimen   #Fibromyalgia/ Chronic Pain. Patient with chronic back pain in addition to diffuse abdominal pain.  -continue lyrica 3051mBID -continue tramadol 5038mID -continue fentanyl patch  #History of migraines -imitrex PRN   #Seizure disorder - No seizures activity this admission  -continue keppra   #Deconditioning - PT recommends HH PT  -continue PT/OT   #Anemia: Patient has low folate and B12, also with low ferretin.  -continue folate and B12 supplementation   Prophylaxis: -heparin -protonix 78m82mD  #Dispo- Disposition is deferred at this time, awaiting improvement of current medical problems. Anticipated discharge in approximately 1-2 day(s).   This is a [employer] information officere.  The care of the patient was discussed with Dr. GillGordy Levan the assessment and plan formulated with their assistance.  Please see their attached note for official documentation of the daily encounter.  WoodCiro Backer6/2015, 12:50 PM

## 2013-11-03 NOTE — Procedures (Addendum)
Objective Swallowing Evaluation: Modified Barium Swallowing Study  Patient Details  Name: [name] [name] MRN: [id number] Date of Birth: 02/16/1961  Today's Date: 11/03/2013 Time: 1310-1340 SLP Time Calculation (min): 30 min  Past Medical History:  Past Medical History  Diagnosis Date  . Fibromyalgia   . Chronic pain   . Seizures   . Migraine   . Pneumonia    Past Surgical History: History reviewed. No pertinent past surgical history. HPI: [name] Caulieves Arizpe is a 53 y.o. [place] female with PMH fibromyalgia, chronic pain with a recent admission for CAP complicated by septic shock requiring ICU-level care (1/27-2/10/15) presents to the ED with recurrent dyspnea       Assessment / Plan / Recommendation Clinical Impression  Dysphagia Diagnosis: Mild pharyngeal phase dysphagia Clinical impression: Pt presents with a mild oropharygneal dysphagia. Pt has a slight delay in swallow initiation which causes episodes of trace penetration/aspiration with sensation when bolus size with thin liquids is overlarge (straw sips and when trying to take pills). The pt does not fully expel penetrate or aspirate with cough. Pt typically does not use straws and takes careful small sips at baseline. It is unlikely that infrequent trace aspiration is impacting her pulmonary function based on findings of today's study. Provided education to pt regarding limiting bolus size, avoiding straws and taking larger pills in puree. No f/u therapy or diet modification is needed.     Treatment Recommendation  No treatment recommended at this time    Diet Recommendation Regular;Thin liquid   Liquid Administration via: Cup;No straw Medication Administration: Whole meds with puree Supervision: Patient able to self feed;Intermittent supervision to cue for compensatory strategies Compensations: Slow rate;Small sips/bites;Follow solids with liquid Postural Changes and/or Swallow Maneuvers: Seated upright 90 degrees    Other   Recommendations Oral Care Recommendations: Oral care Q4 per protocol;Oral care BID   Follow Up Recommendations  None    Frequency and Duration        Pertinent Vitals/Pain NA    SLP Swallow Goals     General HPI: 53 yo female with recent admission for CAP re-presents with fever, leukocytosis, chest pain, and hypoxia in setting of possible seizure vs pre-syncope. Intubated from 1/27 to 2/1. Type of Study: Modified Barium Swallowing Study Reason for Referral: Objectively evaluate swallowing function Previous Swallow Assessment: BSE 1/22 - suspected esopahgeal dysphagia. Rec Dys 3/thin with esophageal precautions.  Diet Prior to this Study: Dysphagia 1 (puree) Temperature Spikes Noted: No Respiratory Status: Nasal cannula History of Recent Intubation: No Behavior/Cognition: Alert;Cooperative;Pleasant mood Oral Cavity - Dentition: Adequate natural dentition Self-Feeding Abilities: Able to feed self Patient Positioning: Upright in chair Baseline Vocal Quality: Clear Volitional Cough: Strong Volitional Swallow: Able to elicit    Reason for Referral Objectively evaluate swallowing function   Oral Phase Oral Preparation/Oral Phase Oral Phase: WFL   Pharyngeal Phase Pharyngeal Phase Pharyngeal Phase: Impaired Pharyngeal - Thin Pharyngeal - Thin Cup: Within functional limits Pharyngeal - Thin Straw: Penetration/Aspiration before swallow;Trace aspiration;Delayed swallow initiation Penetration/Aspiration details (thin straw): Material enters airway, CONTACTS cords and not ejected out (despite cough response) Pharyngeal - Solids Pharyngeal - Puree: Within functional limits Pharyngeal - Regular: Within functional limits Pharyngeal - Pill: Delayed swallow initiation;Penetration/Aspiration before swallow;Trace aspiration Penetration/Aspiration details (pill): Material enters airway, passes BELOW cords and not ejected out despite cough attempt by patient  Cervical Esophageal  Phase    GO    Cervical Esophageal Phase Cervical Esophageal Phase: WFL (Esophageal sweep did not reveal any abnormality. No radiolog)  Harlon Ditty, MA CCC-SLP [phone]  Claudine Mouton 11/03/2013, 2:07 PM

## 2013-11-03 NOTE — Progress Notes (Signed)
Echocardiogram 2D Echocardiogram has been performed.  Shan Valdes 11/03/2013, 2:35 PM

## 2013-11-03 NOTE — Discharge Summary (Signed)
Name: [name] [name] MRN: [id number] DOB: 12/19/1960 53 y.o. PCP: Ivin Poot, MD . Date of Admission: 11/02/2013 Date of Discharge: 11/08/2013 Attending Physician: Dr. Linus Salmons  Discharge Diagnosis: Principal Problem:   Acute respiratory failure with hypoxia Active Problems:   Sepsis   Chronic pain   Seizure disorder   Fibromyalgia   Anemia of chronic disease   Severe sepsis(995.92)   HCAP (healthcare-associated pneumonia)   Connective tissue disease   ILD (interstitial lung disease)   Acute respiratory failure   Pneumonitis  Discharge Medications:   Medication List    STOP taking these medications       valACYclovir 1000 MG tablet  Commonly known as:  VALTREX      TAKE these medications       B-12 2000 MCG Tabs  Take 2,000 mcg by mouth daily.     etodolac 500 MG tablet  Commonly known as:  LODINE  Take 500 mg by mouth 2 (two) times daily.     fentaNYL 50 MCG/HR  Commonly known as:  DURAGESIC - dosed mcg/hr  Place 1 patch (50 mcg total) onto the skin every 3 (three) days.     folic acid 1 MG tablet  Commonly known as:  FOLVITE  Take 1 tablet daily.     levETIRAcetam 500 MG tablet  Commonly known as:  KEPPRA  Take 1 tablet (500 mg total) by mouth 2 (two) times daily.     pantoprazole 40 MG tablet  Commonly known as:  PROTONIX  Take 1 tablet (40 mg total) by mouth 2 (two) times daily.     polyethylene glycol packet  Commonly known as:  MIRALAX / GLYCOLAX  Take 17 g by mouth daily as needed for mild constipation or moderate constipation.     potassium chloride 10 MEQ tablet  Commonly known as:  K-DUR  Take 1 tablet (10 mEq total) by mouth daily.     predniSONE 10 MG tablet  Commonly known as:  DELTASONE  3 tabs po daily x 7 days then 2 tabs po daily x 7 days then 1 tab po daily x 7 days then STOP     pregabalin 300 MG capsule  Commonly known as:  LYRICA  Take 1 capsule (300 mg total) by mouth 2 (two) times daily.     rizatriptan 10 MG tablet    Commonly known as:  MAXALT  Take 10 mg by mouth daily as needed for migraine. May repeat in 2 hours if needed     traMADol 50 MG tablet  Commonly known as:  ULTRAM  Take 1 tablet (50 mg total) by mouth every 12 (twelve) hours as needed for moderate pain.     Vitamin D (Ergocalciferol) 50000 UNITS Caps capsule  Commonly known as:  DRISDOL  Take 50,000 Units by mouth every 7 (seven) days. Take on Saturday        Disposition and follow-up:   [name] [name] was discharged from [facility] in Stable condition.  Please address the following problems:  1. Continued shortness of breath 2. Follow up with pulmonology regarding lung biopsy 3. Follow up with rheumatology 4. Compliance with prednisone 5. Chronic pain?  Fentanyl patch for fibromyalgia? Referral to pain clinic?   6. Seizure disorder? 7. Continued need for home O2?  Labs / imaging needed at time of follow-up: BMP (pt has h/o hypokalemia)  Pending labs/ test needing follow-up:   Follow-up Appointments: Follow-up Information   Follow up with [facility]     On 11/10/2013. (2:30PM    Please bring photo ID and all medications with you to your appointment.  )    Contact information:   [place] [id number] [phone]      Follow up with [facility], MD On 12/01/2013. (10:30am with chest xray)    Specialty:  Pulmonary Disease   Contact information:   [place] [address] [phone]       Discharge Instructions: Discharge Orders   Future Appointments Provider Department Dept Phone   11/10/2013 2:30 PM [facility] Covering Provider [place] [phone]   12/01/2013 10:30 AM Brand Males, MD [facility] [phone]   Future Orders Complete By Expires   Call MD for:  As directed    Scheduling Instructions:     Difficulty breathing   Diet - low sodium heart healthy  As directed     Discharge instructions  As directed    Comments:     Regarding your prednisone taper, please take this as follows: 9m (3 tablets) for 7 days, then 250m(2 tablets) for 7 days, then 1034m1 tablet) for 7 days.  Be sure to follow up with the lung doctors as scheduled.  You may also use oxygen as needed for shortness of breath.   Your urine sample does not suggest that you have a urinary tract infection.  Since your diarrhea has subsided, I am not concerned, but please establish yourself with your primary care doctor for further evaluation if needed.   Increase activity slowly  As directed       Consultations:  Pulmonology, rheumatology  Procedures Performed:  Dg Neck Soft Tissue  10/08/2013   CLINICAL DATA:  Dysphagia.  EXAM: NECK SOFT TISSUES - 1+ VIEW  COMPARISON:  None.  FINDINGS: There is no evidence of retropharyngeal soft tissue swelling or epiglottic enlargement. The cervical airway is unremarkable and no radio-opaque foreign body identified. Tongue base is normal. Minimal anterior osteophytes in the lower cervical spine. No disc space narrowing.  IMPRESSION: No significant abnormality.   Electronically Signed   By: JimRozetta NunneryD.   On: 10/08/2013 17:40   Dg Chest 2 View  10/05/2013   CLINICAL DATA:  History of chest pain for 48 hr. And arthritis and fibroma myalgia  EXAM: CHEST  2 VIEW  COMPARISON:  None.  FINDINGS: The lungs are well-expanded. There are fluffy increased lung markings bilaterally but most conspicuously in the right upper lobe. The cardiopericardial silhouette is normal in size. The pulmonary vascularity is not engorged. There is no pleural effusion or pneumothorax. The mediastinum is normal in width. The observed portions of the bony thorax exhibit no acute abnormalities.  IMPRESSION: Increased interstitial and alveolar densities bilaterally but predominantly on the right are consistent with pneumonia. Certainly other alveolar filling process ease could be a present.  Follow-up chest films or chest CT scanning following therapy will be needed to assure clearing.   Electronically Signed   By: David  JorMartiniqueOn: 10/05/2013 16:33   Ct Angio Chest Pe W/cm &/or Wo Cm  11/02/2013   CLINICAL DATA:  Worsening shortness of breath  EXAM: CT ANGIOGRAPHY CHEST WITH CONTRAST  TECHNIQUE: Multidetector CT imaging of the chest was performed using the standard protocol during bolus administration of intravenous contrast. Multiplanar CT image reconstructions and MIPs were obtained to evaluate the vascular anatomy.  CONTRAST:  100m33mNIPAQUE IOHEXOL 350 MG/ML SOLN  COMPARISON:  Prior radiograph from earlier the same day.  FINDINGS: Study is degraded by motion artifact.  The thyroid is normal.  Mediastinal and hilar the adenopathy again seen, slightly improved relative to the prior study. Index prevascular node measures 3.1 x 1.5 cm, previously 3.6 x 2.5 cm, image 95. Right paratracheal node measures approximately 2.1 x 1.4 cm, previously 3.0 x 1.7 cm. Enlarged subcarinal and bilateral hilar adenopathy is slightly improved.  Heart size within normal limits.  Trace pericardial fluid noted.  Evaluation of the pulmonary arteries is somewhat limited due to motion. No filling defects are seen to suggest acute pulmonary embolism. Re-formatted imaging confirms these findings.  Multi focal ground-glass opacities are again seen involving the lungs bilaterally, greatest within the upper lobes. Overall, these opacities are slightly improved. There is involvement of the right middle lobe and lingula, similar to prior. No pulmonary edema or pleural effusion.  Visualized upper abdomen is within normal limits.  No acute osseous abnormality.  IMPRESSION: 1. No evidence of acute pulmonary embolism. 2. Persistent upper lobe predominant bilateral airspace and ground-glass opacities, improved relative to most recent CT from 1/20 8/1 5. Again, multi lobar pneumonia is favored. 3. Mediastinal and bilateral  adenopathy is above, slightly decreased in size relative to prior.   Electronically Signed   By: Jeannine Boga M.D.   On: 11/02/2013 05:04   Ct Angio Chest Pe W/cm &/or Wo Cm  10/15/2013   CLINICAL DATA:  Syncope, fever, shortness of breath  EXAM: CT ANGIOGRAPHY CHEST WITH CONTRAST  TECHNIQUE: Multidetector CT imaging of the chest was performed using the standard protocol during bolus administration of intravenous contrast. Multiplanar CT image reconstructions including MIPs were obtained to evaluate the vascular anatomy.  CONTRAST:  120m OMNIPAQUE IOHEXOL 350 MG/ML SOLN  COMPARISON:  10/14/2013, 10/05/2013  FINDINGS: Visualized pulmonary arteries are patent. No significant filling defect or pulmonary embolus demonstrated by CTA. Intact thoracic aorta and major branch vessels. No dissection or aneurysm. Normal heart size. No pericardial or pleural effusion. Diffuse mediastinal and bilateral hilar adenopathy. Index left prevascular lymph noted measures 3.6 x 2.5 cm, image 49. Superior right peritracheal lymph node measures 3.0 x 1.7 cm, image 30 fine. Enlarged subcarinal and bilateral hilar adenopathy also present.  Included upper abdomen demonstrates punctate nonobstructing intrarenal calculi in the upper poles. No other acute finding.  Lung windows demonstrate mixed airspace and ground-glass nodular opacities extensively throughout the upper lobes, right middle lobe, lingula, and the superior segments of the lower lobes. Infectious/inflammatory process is favored such as multi lobar pneumonia. Other less likely considerations include acute alveolar hemorrhage or alveolar edema.  No acute osseous finding.  Review of the MIP images confirms the above findings.  IMPRESSION: No significant acute pulmonary embolus by CTA.  Bilateral prominently upper lobe mixed airspace and ground-glass opacities, multi lobar pneumonia is favored.  Mediastinal and bilateral hilar adenopathy. Recommend short-term followup  after treatment of the acute episode to document resolution and to exclude a lymphoproliferative process   Electronically Signed   By: TDaryll BrodM.D.   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 Question sarcoidosis. Bacteremia.  EXAM: MRI HEAD WITHOUT AND WITH CONTRAST  TECHNIQUE: Multiplanar, multiecho pulse sequences of the brain and surrounding structures were obtained without and with intravenous contrast.  CONTRAST:  161mMULTIHANCE GADOBENATE DIMEGLUMINE 529 MG/ML IV SOLN  COMPARISON:  CT head 10/15/2013  FINDINGS: Image quality degraded by mild  motion.  Subcortical hyperintensities in the left parietal white matter are nonspecific. These are non masslike and do not enhance. Remaining white matter is normal. Brainstem and cerebellum are normal.  Negative for acute infarct.  Negative for hemorrhage or mass lesion.  No edema in the brain.  Postcontrast imaging reveals normal enhancement. No enhancing mass or leptomeningeal thickening is identified. No evidence of neurosarcoidosis.  IMPRESSION: Patchy subcortical hyperintensities in the left parietal white matter. These are nonspecific and could be seen with chronic microvascular ischemia. They do not appear to be acute and do not enhance.  No acute infarct or mass.  Normal enhancement.   Electronically Signed   By: Franchot Gallo M.D.   On: 10/24/2013 20:59   US Abdomen Complete  10/15/2013   CLINICAL DATA:  Epigastric pain, evaluate for pancreatitis.  EXAM: ULTRASOUND ABDOMEN COMPLETE  COMPARISON:  None.  FINDINGS: Gallbladder:  Surgically absent.  Common bile duct:  Diameter: 7 mm, which is upper normal to mildly prominent. The distal duct near the ampulla is obscured by bowel gas artifact.  Liver:  No focal lesion identified. Within normal limits in parenchymal echogenicity. There may be a slight intrahepatic biliary ductal prominence.  IVC:  No abnormality visualized.  Pancreas:   Visualized portion unremarkable.  Spleen:  Measures 8 cm in oblique diameter. Nonspecific appearance to the hilum.  Right Kidney:  Length: 10.2 cm. Linear echogenic foci measuring up to 8 mm, favored to reflect stones. No hydronephrosis.  Left Kidney:  Length: 10.6 cm pre. Linear echogenic foci measuring up to 6 mm, favored to reflect stones. No hydronephrosis.  Abdominal aorta:  No aneurysm visualized.  Other findings:  None.  IMPRESSION: Mild intra and extrahepatic biliary ductal prominence is nonspecific status post cholecystectomy. Correlate with LFTs and ERCP if warranted.  No appreciable abnormality of the pancreas however note that ultrasound has limited sensitivity in evaluating for pancreatitis and associated complications.  Nonspecific appearance to the spleen.  Recommend CT.  Bilateral renal stones.  No hydronephrosis.   Electronically Signed   By: Carlos Levering M.D.   On: 10/15/2013 01:02   Ct Abdomen Pelvis W Contrast  10/25/2013   CLINICAL DATA:  53 year old female with but identified source of infection. Vomiting. Left lower quadrant pain. Initial encounter.  EXAM: CT ABDOMEN AND PELVIS WITH CONTRAST  TECHNIQUE: Multidetector CT imaging of the abdomen and pelvis was performed using the standard protocol following bolus administration of intravenous contrast.  CONTRAST:  156m OMNIPAQUE IOHEXOL 300 MG/ML  SOLN  COMPARISON:  KUB 10/15/2013 and earlier. Portable chest radiographs 10/21/2013.  FINDINGS: Mild motion artifact at the lung bases. No pericardial or pleural effusion. Minimal mostly dependent lower lobe opacity. No consolidation.  No acute osseous abnormality identified.  Moderate volume of gas within the bladder (series 2, image 72). Bladder distension with hyperdense fluid. Sagittal images suggest some perivesical fat stranding (series 7 image 67).  Uterus is surgically absent. No definite pelvic free fluid. Oral contrast has reached the proximal rectum. Intermittent gaseous distension  and decompression of the sigmoid colon, with no definite sigmoid inflammation. Similar appearance of the distal left colon. Splenic flexure has a more normal appearance. Redundant transverse colon. Negative right colon. Appendix is looped upon itself but appears within normal limits. No dilated small bowel. Mildly distended stomach. Duodenum within normal limits.  Streak artifact through the upper abdomen. Gallbladder surgically absent. Negative liver, spleen, pancreas and adrenal glands. Multiple bilateral intra renal calculi. No obstructive uropathy. Portal venous system within normal  limits.  The abdominal aorta is patent with soft plaque distally. 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 The left external iliac artery also likely is thrombosed. There is evidence of reconstituted flow at the left common femoral artery. The right iliac arteries appear patent with atherosclerosis.  No abdominal free fluid. Some mesenteric haziness is noted in the upper abdomen, nonspecific. No lymphadenopathy identified.  IMPRESSION: 1. Gas within the bladder, and suggestion of some perivesical stranding, most compatible with acute urinary infection unless there has been recent bladder catheterization. 2. Hyperdense fluid within the bladder, with dense bladder contrast noted on 10/15/2013. Suspect today's density is excreted contrast rather than hematuria. Clinical correlation recommended. 3. Nephrolithiasis but no evidence of obstructive uropathy. 4. 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 Flow is reconstituted at the left common femoral artery.   Electronically Signed   By: Lars Pinks M.D.   On: 10/25/2013 02:16   Dg Chest Port 1 View  11/03/2013   CLINICAL DATA:  Pneumonia  EXAM: PORTABLE CHEST - 1 VIEW  COMPARISON:  Prior CT and radiograph from 11/02/2013  FINDINGS: The cardiac and mediastinal silhouettes are stable in size and contour, and remain within normal limits.  Lungs are normally inflated.  Again seen are multi focal parenchymal infiltrates involving the bilateral upper lobes and left lower lobe, compatible with pneumonia. Overall, these are not significantly changed. No new focal infiltrate. No pulmonary edema or pleural effusion. No pneumothorax.  Osseous structures are unchanged.  IMPRESSION: No significant interval change and bilateral upper and midlung irregular airspace opacities with more confluent left lower lobe opacity. These findings may be infectious or inflammatory in nature.   Electronically Signed   By: Jeannine Boga M.D.   On: 11/03/2013 06:03   Dg Chest Port 1 View  11/02/2013   CLINICAL DATA:  Fever, shortness of breath  EXAM: PORTABLE CHEST - 1 VIEW  COMPARISON:  Prior chest x-ray 10/21/2013  FINDINGS: Stable cardiac and mediastinal contours. Increased left lower lobe patchy airspace opacity. Background diffuse interstitial prominence and bilateral central airway thickening is similar compared to prior. No pleural effusion or pneumothorax. No acute osseous abnormality.  IMPRESSION: Developing left basilar opacity concerning for and acute on chronic infectious or inflammatory process.  Persistent bilateral upper an mid lung irregular airspace opacities without significant interval change.   Electronically Signed   By: Jacqulynn Cadet M.D.   On: 11/02/2013 01:11   Dg Chest Port 1 View  10/21/2013   CLINICAL DATA:  Cough and shortness of breath.  EXAM: PORTABLE CHEST - 1 VIEW  COMPARISON:  DG CHEST 1V PORT dated 10/20/2013; DG CHEST 1V PORT dated 10/19/2013; CT ANGIO CHEST W/CM &/OR WO/CM dated 10/15/2013  FINDINGS: Underlying mild hyperinflation. Midline trachea. Normal heart size. No pleural effusion or pneumothorax. Improved to resolved interstitial edema. Mild asymmetric pulmonary venous congestion suspected, primarily on the left. Patchy left-sided airspace disease is also slightly improved. Most apparent at the left lung base.  IMPRESSION: Improved interstitial and  airspace disease. Primarily felt to represent pulmonary edema. Improving infection could look similar.   Electronically Signed   By: Abigail Miyamoto M.D.   On: 10/21/2013 07:08   Dg Chest Port 1 View  10/20/2013   CLINICAL DATA:  Shortness of breath after extubation  EXAM: PORTABLE CHEST - 1 VIEW  COMPARISON:  10/19/2013  FINDINGS: Interval removal of support apparatus. Interval increase in diffuse interstitial opacification. Background biapical infiltrates, assessed by previous CT imaging. Stable  heart size and mediastinal contours. No pleural effusion or pneumothorax.  IMPRESSION: 1. Interval development of pulmonary edema. 2. Background biapical infiltrates, pneumonia based on CT 10/16/2013.   Electronically Signed   By: Jorje Guild M.D.   On: 10/20/2013 06:08   Dg Chest Port 1 View  10/19/2013   CLINICAL DATA:  Ventilated patient.  EXAM: PORTABLE CHEST - 1 VIEW  COMPARISON:  10/17/2013  FINDINGS: Cardiac silhouette is normal in size.  Normal mediastinal contours.  Patchy areas of predominantly upper lobe airspace opacity have mildly improved. No new lung opacities. No pleural effusion or pneumothorax.  Endotracheal tube, left internal jugular central venous line and nasogastric tube are stable well-positioned.  IMPRESSION: 1. Improved lung infiltrates. 2. Support apparatus is stable in well positioned.   Electronically Signed   By: Lajean Manes M.D.   On: 10/19/2013 08:13   Dg Chest Port 1 View  10/17/2013   CLINICAL DATA:  Evaluate endotracheal tube.  EXAM: PORTABLE CHEST - 1 VIEW  COMPARISON:  DG CHEST 1V PORT dated 10/16/2013; CT ANGIO CHEST W/CM &/OR WO/CM dated 10/15/2013  FINDINGS: Endotracheal tube 3 cm from the carina. Left IJ central line remains present. Enteric tube is present with the redundant coital in the stomach. Improving bilateral upper lobe predominant airspace disease, now roughly symmetric. No effusion is identified. Monitoring leads project over the chest.  IMPRESSION: 1.  Endotracheal tube 3 cm from the carina. Other support apparatus appear similar. 2. Improving bilateral upper lobe airspace disease, and now roughly symmetric.   Electronically Signed   By: Dereck Ligas M.D.   On: 10/17/2013 07:16   Dg Chest Port 1 View  10/16/2013   CLINICAL DATA:  Bilateral pneumonia.  EXAM: PORTABLE CHEST - 1 VIEW  COMPARISON:  10/15/2013 and 10/14/2013  FINDINGS: Endotracheal tube and central line and the NG tube appear in good position. Infiltrates in the right lung appears slightly improved. There is slight increased infiltrate in the left upper lobe.  Heart size and vascularity are normal.  No effusions.  IMPRESSION: Improving pneumonia on the right. Slight increased consolidation on the left.   Electronically Signed   By: Rozetta Nunnery M.D.   On: 10/16/2013 07:59   Dg Chest Port 1 View  10/15/2013   CLINICAL DATA:  Central line placement  EXAM: PORTABLE CHEST - 1 VIEW  COMPARISON:  10/15/2013  FINDINGS: The endotracheal tube tip projects E 0.1 cm proximal to the carina. Left IJ central venous catheter tip projects over the distal SVC. No appreciable pneumothorax. Multiple other wires and tubes are presumed external. Perihilar/upper lobe predominant airspace opacities persist.  IMPRESSION: Interval left IJ catheter placement with tip projecting over the distal SVC. No pneumothorax.  Perihilar/ upper lobe predominant airspace and interstitial opacities persist.   Electronically Signed   By: Carlos Levering M.D.   On: 10/15/2013 04:56   Dg Chest Port 1 View  10/15/2013   CLINICAL DATA:  Assess endotracheal tube  EXAM: PORTABLE CHEST - 1 VIEW  COMPARISON:  10/15/2013 CT  FINDINGS: Endotracheal tube tip 2.2 cm proximal to the carina. Bilateral upper lobe and perihilar interstitial and airspace opacities, increased. Hilar and paratracheal adenopathy. No pneumothorax or interval osseous change.  IMPRESSION: Endotracheal tube tip 2.2 cm proximal to the carina.  Worsening bilateral  interstitial and airspace opacities.  Hilar and paratracheal adenopathy.   Electronically Signed   By: Carlos Levering M.D.   On: 10/15/2013 04:15   Dg Chest Port 1 View  10/14/2013  CLINICAL DATA:  Pneumonia  EXAM: PORTABLE CHEST - 1 VIEW  COMPARISON:  10/05/2013  FINDINGS: Worsening poorly marginated airspace opacities in the left upper lobe. Some improvement in the alveolar and airspace opacities in the right mid and upper lung. Heart size normal. Effacement of the aorticopulmonary window suggesting adenopathy. . No effusion. Visualized skeletal structures are unremarkable.  IMPRESSION: 1. Improving right and slightly increasing left airspace disease. 2. Possible AP window adenopathy.   Electronically Signed   By: Arne Cleveland M.D.   On: 10/14/2013 18:30   Dg Abd Portable 1v  10/15/2013   CLINICAL DATA:  Orogastric tube placement  EXAM: PORTABLE ABDOMEN - 1 VIEW  COMPARISON:  Portable exam 0522 hr without priors for comparison.  FINDINGS: Orogastric tube is coiled in stomach with tip projecting over approximate position of pylorus.  Nonobstructive bowel gas pattern.  No bowel dilatation or bowel wall thickening.  Excreted contrast within mildly distended urinary bladder and within renal collecting systems.  Osseous structures unremarkable.  Lung bases clear.  IMPRESSION: Orogastric tube coiled in stomach.   Electronically Signed   By: Lavonia Dana M.D.   On: 10/15/2013 08:05   Dg Swallowing Func-speech Pathology  11/03/2013   Katherene Ponto [name], CCC-SLP     11/03/2013  2:11 PM Objective Swallowing Evaluation: Modified Barium Swallowing Study   Patient Details  Name: [name] [name] MRN: [id number] Date of Birth: 30-Sep-1960  Today's Date: 11/03/2013 Time: 1310-1340 SLP Time Calculation (min): 30 min  Past Medical History:  Past Medical History  Diagnosis Date  . Fibromyalgia   . Chronic pain   . Seizures   . Migraine   . Pneumonia    Past Surgical History: History reviewed. No pertinent past  surgical  history. HPI: Dainelle Hun is a 53 y.o. [name] female with PMH  fibromyalgia, chronic pain with a recent admission for CAP  complicated by septic shock requiring ICU-level care  (1/27-2/10/15) presents to the ED with recurrent dyspnea       Assessment / Plan / Recommendation Clinical Impression  Dysphagia Diagnosis: Mild pharyngeal phase dysphagia Clinical impression: Pt presents with a mild oropharygneal  dysphagia. Pt has a slight delay in swallow initiation which  causes episodes of trace penetration/aspiration with sensation  when bolus size with thin liquids is overlarge (straw sips and  when trying to take pills). The pt does not fully expel penetrate  or aspirate with cough. Pt typically does not use straws and  takes careful small sips at baseline. It is unlikely that  infrequent trace aspiration is impacting her pulmonary function  based on findings of today's study. Provided education to pt  regarding limiting bolus size, avoiding straws and taking larger  pills in puree. No f/u therapy or diet modification is needed.     Treatment Recommendation  No treatment recommended at this time    Diet Recommendation Regular;Thin liquid   Liquid Administration via: Cup;No straw Medication Administration: Whole meds with puree Supervision: Patient able to self feed;Intermittent supervision  to cue for compensatory strategies Compensations: Slow rate;Small sips/bites;Follow solids with  liquid Postural Changes and/or Swallow Maneuvers: Seated upright 90  degrees    Other  Recommendations Oral Care Recommendations: Oral care Q4  per protocol;Oral care BID   Follow Up Recommendations  None    Frequency and Duration        Pertinent Vitals/Pain NA    SLP Swallow Goals     General HPI: 53 yo female with recent admission for CAP  re-presents with fever, leukocytosis, chest pain, and hypoxia in  setting of possible seizure vs pre-syncope. Intubated from 1/27  to 2/1. Type of Study: Modified Barium Swallowing Study  Reason for Referral: Objectively evaluate swallowing function Previous Swallow Assessment: BSE 1/22 - suspected esopahgeal  dysphagia. Rec Dys 3/thin with esophageal precautions.  Diet Prior to this Study: Dysphagia 1 (puree) Temperature Spikes Noted: No Respiratory Status: Nasal cannula History of Recent Intubation: No Behavior/Cognition: Alert;Cooperative;Pleasant mood Oral Cavity - Dentition: Adequate natural dentition Self-Feeding Abilities: Able to feed self Patient Positioning: Upright in chair Baseline Vocal Quality: Clear Volitional Cough: Strong Volitional Swallow: Able to elicit    Reason for Referral Objectively evaluate swallowing function   Oral Phase Oral Preparation/Oral Phase Oral Phase: WFL   Pharyngeal Phase Pharyngeal Phase Pharyngeal Phase: Impaired Pharyngeal - Thin Pharyngeal - Thin Cup: Within functional limits Pharyngeal - Thin Straw: Penetration/Aspiration before  swallow;Trace aspiration;Delayed swallow initiation Penetration/Aspiration details (thin straw): Material enters  airway, CONTACTS cords and not ejected out (despite cough  response) Pharyngeal - Solids Pharyngeal - Puree: Within functional limits Pharyngeal - Regular: Within functional limits Pharyngeal - Pill: Delayed swallow  initiation;Penetration/Aspiration before swallow;Trace aspiration Penetration/Aspiration details (pill): Material enters airway,  passes BELOW cords and not ejected out despite cough attempt by  patient  Cervical Esophageal Phase    GO    Cervical Esophageal Phase Cervical Esophageal Phase: WFL (Esophageal sweep did not reveal  any abnormality. No radiolog)        Herbie Baltimore, MA CCC-SLP [phone]  Lynann Beaver 11/03/2013, 2:07 PM    Ct Portable Head W/o Cm  10/15/2013   CLINICAL DATA:  Altered mental status.  Seizure.  EXAM: CT HEAD WITHOUT CONTRAST  TECHNIQUE: Contiguous axial images were obtained from the base of the skull through the vertex without intravenous contrast.  COMPARISON:   None.  FINDINGS: The patient is intubated.  An NG tube is in place.  No acute cortical infarct, hemorrhage, or mass lesion is present. The ventricles are of normal size. No significant extra-axial fluid collection is present. The paranasal sinuses and mastoid air cells are clear. The osseous skull is intact.  IMPRESSION: Negative portable CT of the head.   Electronically Signed   By: Lawrence Santiago M.D.   On: 10/15/2013 18:08    2D Echo: N/A  Cardiac Cath: N/A  Admission HPI: [name] [name] is a 53 y.o. [name] female with PMH fibromyalgia and ?seizure disorder with a recent admission for CAP complicated by septic shock requiring ICU-level care (1/27-2/10/15) presents to the ED with recurrent dyspnea.  Patient reports she had been doing well at home since discharge until this morning, when she suddenly started having difficulty breathing. This was accompanied by a cough productive of brown sputum. She felt better in the middle of the day and ate a regular lunch. Her dyspnea then worsened in the afternoon, so her brother brought her to the ED. She endorses some pleuritic type chest pain radiating to the back. She also had 2 episodes of nonbloody diarrhea today. Otherwise, she denies abdominal pain, nausea, vomiting, dysuria, hematuria. She tells [place] she has been fully compliant with all of her medications including her steroid taper, and her brother has been helping her with this. He also fully sanitized her bedroom after discharge. She denies tobacco, alcohol, illicit drugs. She moved here from [place] 3 months ago. She cannot pinpoint an event or trigger associated with her recurrent dyspnea and fevers.  Last admission, patient presented  with temp 103 with leukocytosis >40K and worsening bilateral interstitial and airspace opacities with some hilar and paratracheal adenopathy. Her O2 sats dropped to 80% in the ED and she was placed on BiPAP with no improvement in her respiratory status. She was  subsequently intubated and transferred to the ICU. CTA was negative for PE. Bronchoscopy revealed no purulence in her airways, no DAH, no HSV lesions, and no mass lesions. Bronchoalveolar lavage was negative for pneumocystis, AFB, fungus, and group A strep, with negative respiratory virus panel. Rapid flu and HIV were negative and blood cultures showed no growth. She was treated with azithromycin from 1/27-1/29, vancomycin and zosyn from 1/27-1/30, ceftriaxone from 1/30-2/2, and tamiflu from 1/28-1/31. Due to concurrent hematuria, there was concern for vasculitis and she was also started on solumedrol 56m q6hrs. On 1/30, she developed hypotension and bradycardia requiring pressors for 24hrs. By 2/1, her respiratory status improved and she was able to be extubated. She was transferred to the floor on 2/4 and her O2 saturations remained >93% without any oxygen after that.  In the ED she was given Solu-Medrol 125 mg IV x1, 1 L normal saline bolus x3cefepime 2 g IV x1, vancomycin 1000 mg IV x1.   Hospital Course by problem list: Principal Problem:   Acute respiratory failure with hypoxia Active Problems:   Sepsis   Chronic pain   Seizure disorder   Fibromyalgia   Anemia of chronic disease   Severe sepsis(995.92)   HCAP (healthcare-associated pneumonia)   Connective tissue disease   ILD (interstitial lung disease)   Acute respiratory failure   Pneumonitis   #Acute respiratory failure- Patient presented in respiratory distress with O2 sats in the 80's, tachypnea, and ABG suggesting primary respiratory alkalosis. She had a CT angiogram which was negative for PE. CXR showed increased left lower lobe patchy airspace opacity and chest CT showed somewhat improved mediastinal and bilateral adenopathy. Pulmonology critical care medicine was consulted and she was given pulse dose steroids (solumedrol 2559mq6) for 2 days followed by a prednisone taper to be continued over 6 weeks with starting dose 3079mdaily. She was weaned off oxygen and her O2 sats remained 100% on RA without exertion for >48 hours prior to discharge. However, on ambulation, her O2 sats continued to drop into the upper 80's and she was discharged on home oxygen for PRN use. Repeat CXR the day prior to admission showed near complete resolution of her bilateral pulmonary infiltrates. At this time, the etiology of her recurrent episodes of respiratory distress is still unknown - differential includes interstitial lung disease, sarcoidosis, pulmonary hypertension. She should follow up with pulmonology for repeat CT in 4-6 wks to evaluate mediastinal lymphadenopathy and consider lung biopsy.   #Fever - Patient presented with fever to 100 and chest x-ray suggestive of developing left basilar opacity concerning for infectious vs inflammatory process. She also had a significant leukocytosis, but this was complicated by her prednisone use. She was given vancomycin and ceftriaxone in the ED, however these antibiotics were discontinued as the patient was adequately treated for pneumonia in her previous 2 hospitalizations and she did not demonstrate any other signs of infection. Blood cultures, urine culture, strep pneumo and [place] urinary antigens, and respiratory virus panel were all negative. Her fever resolved shortly after admission and she remained afebrile for the remainder of her hospital stay.   #Pleuritic chest pain radiating to back- Patient complained of pleuritic chest pain radiating to the back on admission. She has no history  of CAD and CT angiography was negative for PE. Serial troponins were negative and EKG showed normal sinus rhythm with no ST abnormalities. Her pain resolved spontaneously.   #?Autoimmune disease - During her last admission, the patient was found to be ANA positive with titer of 1:80 and mildly p-ANCA positive with titer of 1:20. Myeloperoxidase was 1.2 (H); ESR 98, and CRP 23.8 RF, anti-DNA 1, cryoglobulin,  ACE, c3/c4 were all within normal limits. TSH/T4 were low, likely due to her acute illness. 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 Vasculitis is less likely due to only mildly elevated p-ANCA, likely clinically insignificant. She will follow up with pulmonology for repeat chest CT and possible lung biopsy in 4-6 weeks.   #?Seizure disorder - Patient has reported history of seizure disorder as a teenager, however neurology records provided by the patient last admit apparently mentioned prior migraine headaches but no seizures. EEG on 1/29 showed no evidence of seizure activity. She was continued on Keppra with no evidence of seizure activity.   #Fibromyalgia and Chronic Pain Syndrome - Patient prescribed tramadol, pregabalin, and Fentanyl patch from pain clinic in [place], she has not been back since September when she moved to [place]. She was continued on her home medications as well as etodolac while in the hospital. She requested a new prescription for fentynl patch, which was provided at discharge. She will be seen in clinic to establish care with a PCP and the need for continued use of fentynl patch can be addressed at this time.   #Hypokalemia- K was 3.5 at admission and was repleated as necessary.   #Suprapubic pain - Patient complained of suprapubic pain on admission but denied dysuria. UA and urine culture were negative.   #Gential herpes - HSV 1 & 2 IgG were positive last admission. Valacyclovir was prescribed for 1 day but was discontinued as there was no evidence of an acute flare.   #Normocytic Anemia - During her last admission, the patient was found to have Vit B12 and folate deficiencies in addition to high ferretin indicating anemia of  chronic disease. She was provided B12, folate, and vitamin D supplementation. Hemoglobin at discharge was 9.2.   #GERD - She was asymptomatic during this admission. Her home protonix 40 mg twice a day was continued.   #DVT PPX - She received subcutaneous heparin for prophylaxis.    Discharge Vitals:   BP 115/56  Pulse 66  Temp(Src) 98.5 F (36.9 C) (Oral)  Resp 20  Ht 5' 1.02" (1.55 m)  Wt 127 lb 13.9 oz (58 kg)  BMI 24.14 kg/m2  SpO2 100%  Discharge Labs:  No results found for this or any previous visit (from the past 24 hour(s)).  Signed: Michail Jewels, MD [phone] 11/09/2013, 9:26 PM   Time Spent on Discharge: 17mnutes Services Ordered on Discharge: None Equipment Ordered on Discharge: None

## 2013-11-04 ENCOUNTER — Ambulatory Visit: Payer: Self-pay | Admitting: Internal Medicine

## 2013-11-04 DIAGNOSIS — R0902 Hypoxemia: Secondary | ICD-10-CM

## 2013-11-04 DIAGNOSIS — R4182 Altered mental status, unspecified: Secondary | ICD-10-CM

## 2013-11-04 DIAGNOSIS — G40909 Epilepsy, unspecified, not intractable, without status epilepticus: Secondary | ICD-10-CM

## 2013-11-04 LAB — GLUCOSE, CAPILLARY
GLUCOSE-CAPILLARY: 106 mg/dL — AB (ref 70–99)
GLUCOSE-CAPILLARY: 140 mg/dL — AB (ref 70–99)
Glucose-Capillary: 166 mg/dL — ABNORMAL HIGH (ref 70–99)
Glucose-Capillary: 110 mg/dL — ABNORMAL HIGH (ref 70–99)

## 2013-11-04 LAB — BASIC METABOLIC PANEL
BUN: 22 mg/dL (ref 6–23)
CHLORIDE: 104 meq/L (ref 96–112)
CO2: 28 mEq/L (ref 19–32)
Calcium: 8.4 mg/dL (ref 8.4–10.5)
Creatinine, Ser: 0.73 mg/dL (ref 0.50–1.10)
Glucose, Bld: 134 mg/dL — ABNORMAL HIGH (ref 70–99)
POTASSIUM: 3.4 meq/L — AB (ref 3.7–5.3)
SODIUM: 146 meq/L (ref 137–147)

## 2013-11-04 LAB — PROCALCITONIN: PROCALCITONIN: 0.44 ng/mL

## 2013-11-04 LAB — HIV-1 RNA ULTRAQUANT REFLEX TO GENTYP+: HIV-1 RNA Quant, Log: <1.3 {Log} (ref <1.30)

## 2013-11-04 LAB — MAGNESIUM: Magnesium: 1.7 mg/dL (ref 1.5–2.5)

## 2013-11-04 MED ORDER — POTASSIUM CHLORIDE CRYS ER 20 MEQ PO TBCR
40.0000 meq | EXTENDED_RELEASE_TABLET | Freq: Every day | ORAL | Status: DC
  Administered 2013-11-05: 40 meq via ORAL
  Filled 2013-11-04 (×2): qty 2

## 2013-11-04 MED ORDER — POTASSIUM CHLORIDE CRYS ER 20 MEQ PO TBCR
40.0000 meq | EXTENDED_RELEASE_TABLET | Freq: Once | ORAL | Status: AC
  Administered 2013-11-04: 40 meq via ORAL
  Filled 2013-11-04: qty 2

## 2013-11-04 NOTE — Progress Notes (Signed)
PULMONARY / CRITICAL CARE MEDICINE  Name: [name] [name] MRN: [id number] DOB: March 09, 1961    ADMISSION DATE:  11/02/2013 CONSULTATION DATE:  11/02/13   REFERRING MD :  Graciella Freer  PRIMARY SERVICE: IMTS   CHIEF COMPLAINT:  Respiratory distress  BRIEF PATIENT DESCRIPTION:   [name] [name] is a 53 y.o. [name] female with PMH fibromyalgia, chronic pain  with a recent admission for CAP complicated by septic shock requiring ICU-level care (1/27-2/10/15) presents to the ED with recurrent dyspnea.   Patient reports she has been weak since discharge and developed fever, chills and increased dyspnea yesterday. Worse pain w/ pleuritic chest pain radiating to the back. She also had 2 episodes of nonbloody diarrhea today. Otherwise, she denies abdominal pain, nausea, vomiting, dysuria, hematuria.  On arrival to ER w/ fever 102.3, tachypnea and very weak.  . CT chest showed no PE ,   Persistent upper lobe predominant bilateral airspace and ground-glass opacities, improved relative to most recent CT from 1/20 8/1 5. Again, multi lobar pneumonia is favored. . Mediastinal and bilateral adenopathy is above, slightly decreased in size relative to prior. CXR showed left basilar opacity . WBC ~30K  In the ED she was given Solu-Medrol 125 mg IV x1, 1 L normal saline bolus x3cefepime 2 g IV x1, vancomycin 1000 mg IV x1.   She denies tobacco, alcohol, illicit drugs. Smoked as teenage briefly.   She moved here from [place] 3 months ago.  Last admission,10/14/13  patient presented with temp 103 with leukocytosis >40K and worsening bilateral interstitial and airspace opacities with some hilar and paratracheal adenopathy. Her O2 sats dropped to 80% in the ED and she was placed on BiPAP with no improvement in her respiratory status. She was subsequently intubated and transferred to the ICU. CTA was negative for PE. Bronchoscopy revealed no purulence in her airways, no DAH, no HSV lesions, and no mass lesions. Bronchoalveolar  lavage was negative for pneumocystis, AFB, fungus, and group A strep, with negative respiratory virus panel. Rapid flu and HIV were negative and blood cultures showed no growth. She was treated with azithromycin from 1/27-1/29, vancomycin and zosyn from 1/27-1/30, ceftriaxone from 1/30-2/2, and tamiflu from 1/28-1/31. Due to concurrent hematuria, there was concern for vasculitis and she was also started on solumedrol 88m q6hrs. On 1/30, she developed hypotension and bradycardia requiring pressors for 24hrs. By 2/1, her respiratory status improved and she was able to be extubated. She was transferred to the floor on 2/4 and her O2 saturations remained >93% without any oxygen after that.   There was concern for  possible autoimmune disease: patient was started on solumedrol 88mq6hrs on 1/29 due to respiratory distress and hematuria with concern for vasculitis. She was found to be ANA positive with titer of 1:80 and mildly p-ANCA positive with titer of 1:20. Myeloperoxidase was 1.2 (H); ESR 98, and CRP 23.8 RF, anti-DNA 1, cryoglobulin, ACE, c3/c4 were all within normal limits. TSH/T4 were low, likely due to her acute illness. 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 Vasculitis is less likely due to only mildly elevated p-ANCA, likely clinically insignificant. Pt was set up for rheumatology clinic for further outpatient workup and pulmonary -has not been seen in OP setting . She was discharged on pred taper.   SIGNIFICANT EVENTS /  STUDIES:  10/14/13 - ADMIT 1/281/28 Abd [place] >>No hydronephrosis, Left kidney 19m stone?, Right kidney 646mstone? Mild intra and extrahepatic biliary ductal prominence,  1/28 CTA chest >>> No PE. Bilateral prominently upper lobe mixed airspace  and ground-glass opacities, multi lobar pneumonia is favored. Mediastinal and bilateral hilar adenopathy  1/28 Bronchoscopy >>> unimpressive secretions, no DAH, no hsv lesions, no mass lesions, BAL sent.  1/28 TTE >>> EF 65-70%, grade 1 DD, PA peak 38, no change from prior  10/15/13 BRONCH BAL - 93% POLYS (Ddx is acute infection, AIP, aspiration) 1/28 CT head >>> Negative for acute process.  1/29 EEG >>> no evidence of seizure activity  1/31 2. 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 Rest negative. Resp VIrus PCR and culture - negative 2/4 off precedex overnight, improved orientation   TTE >>> EF 6517%grade 1 systolic dysfunction 2/12/26/42- DC on 8 day pred taper. Did NOT go  Home on oxygen .................... Marland KitchenEADMIT 11/02/2013 2/15  Chest CT >>>  No pulmonary embolism, persistent upper lobe predominant bilateral airspace and ground-glass opacities, improved relative to most recent CT from 1/28, mediastinal and bilateral adenopathy is above, slightly decreased in size relative to prior.  LINES / TUBES:  CULTURES: 2/15 UC >> 2/15 BC >> 2/15 Viral panel >> 2/15 Sputum  >>  ANTIBIOTICS: Maxipime 2/15 >> Vanc 2/15 >>    INTERVAL HISTORY:  11/03/13: Frustrated she did not get better and dyspnea got worse. SHe was on prednisone taper at home. CT did show improvement though compared to end Jan 2015 and no PE. BNP new high. Patient on 2L [place] but RN says she is fine on RA but needs it for subjective comfort   VITAL SIGNS: Temp:  [98.3 F (36.8 C)-98.6 F (37 C)] 98.3 F (36.8 C) (02/17 0818) Pulse Rate:  [55-111] 111 (02/17 0958) Resp:  [14-28] 20 (02/17 0818) BP: (111-122)/(52-79) 121/79 mmHg (02/17 0818) SpO2:  [93 %-100 %] 96 % (02/17 0340) HEMODYNAMICS:        INTAKE / OUTPUT: Intake/Output     02/16 0701 - 02/17 0700 02/17 0701  - 02/18 0700   P.O. 1070 240   IV Piggyback     Total Intake(mL/kg) 1070 (18.4) 240 (4.1)   Urine (mL/kg/hr) 2950 (2.1)    Total Output 2950     Net -1880 +240        Urine Occurrence 2 x      PHYSICAL EXAMINATION: General:  Frail female in bed  Neuro:  Alert, anxious  HEENT: poor dentition , dry mucosa  Cardiovascular: ST , no m/r/g  Lungs: Anxious. Normal WOB. Some crackles esp bilateral UL Abdomen: soft, BS + , NT , no guarding or rebound  Musculoskeletal:  gen weakness, maew, no joint deformity noted.  Skin:  Intact w/out rash   LABS:  CBC  Recent Labs Lab 11/02/13 0035 11/03/13 0350 11/03/13 2210  WBC 32.7* 9.7 8.9  HGB 10.8* 9.7* 9.2*  HCT 33.6* 30.9* 29.0*  PLT 310 223 231   Coag's No results found for this basename: APTT, INR,  in the last 168 hours  BMET  Recent Labs Lab 11/02/13 0838 11/03/13 0350 11/04/13 0315  NA 145 141 146  K 4.1 3.9 3.4*  CL 109 105 104  CO2 '23 23 28  ' BUN 9 15  22  CREATININE 0.52 0.57 0.73  GLUCOSE 117* 128* 134*   Electrolytes  Recent Labs Lab 11/02/13 0838 11/03/13 0350 11/04/13 0315  CALCIUM 8.1* 8.7 8.4  MG 1.6  --  1.7   Sepsis Markers  Recent Labs Lab 11/02/13 0047 11/02/13 0248 11/02/13 0838 11/03/13 0350 11/03/13 1049 11/04/13 0315  LATICACIDVEN 2.88*  --  1.9 1.3  --   --   PROCALCITON  --  0.97  --   --  0.69 0.44   ABG  Recent Labs Lab 11/02/13 0055  PHART 7.519*  PCO2ART 30.8*  PO2ART 57.0*   Liver Enzymes  Recent Labs Lab 11/02/13 0035  AST 25  ALT 31  ALKPHOS 143*  BILITOT 0.6  ALBUMIN 2.5*   Cardiac Enzymes  Recent Labs Lab 11/02/13 1450  11/03/13 0925 11/03/13 1620 11/03/13 2015  TROPONINI <0.30  < > <0.30 <0.30 <0.30  PROBNP 3533.0*  --   --   --   --   < > = values in this interval not displayed. Glucose  Recent Labs Lab 11/03/13 0319 11/03/13 0724 11/03/13 1120 11/03/13 1548 11/03/13 2136 11/04/13 0817  GLUCAP 117* 123* 191* 146* 158* 106*    Imaging Dg Chest Port 1 View  11/03/2013   CLINICAL DATA:  Pneumonia  EXAM: PORTABLE CHEST - 1 VIEW  COMPARISON:  Prior CT and radiograph from 11/02/2013  FINDINGS: The cardiac and mediastinal silhouettes are stable in size and contour, and remain within normal limits.  Lungs are normally inflated. Again seen are multi focal parenchymal infiltrates involving the bilateral upper lobes and left lower lobe, compatible with pneumonia. Overall, these are not significantly changed. No new focal infiltrate. No pulmonary edema or pleural effusion. No pneumothorax.  Osseous structures are unchanged.  IMPRESSION: No significant interval change and bilateral upper and midlung irregular airspace opacities with more confluent left lower lobe opacity. These findings may be infectious or inflammatory in nature.   Electronically Signed   By: Jeannine Boga M.D.   On: 11/03/2013 06:03   Dg Swallowing Func-speech Pathology  11/03/2013   Katherene Ponto [name], CCC-SLP     11/03/2013  2:11 PM Objective Swallowing Evaluation: Modified Barium Swallowing Study   Patient Details  Name: Katrenia Alkins MRN: [id number] Date of Birth: December 17, 1960  Today's Date: 11/03/2013 Time: 1310-1340 SLP Time Calculation (min): 30 min  Past Medical History:  Past Medical History  Diagnosis Date  . Fibromyalgia   . Chronic pain   . Seizures   . Migraine   . Pneumonia    Past Surgical History: History reviewed. No pertinent past  surgical history. HPI: Cloe Sockwell is a 53 y.o. [name] female with PMH  fibromyalgia, chronic pain with a recent admission for CAP  complicated by septic shock requiring ICU-level care  (1/27-2/10/15) presents to the ED with recurrent dyspnea       Assessment / Plan / Recommendation Clinical Impression  Dysphagia Diagnosis: Mild pharyngeal phase dysphagia Clinical impression: Pt presents with a mild oropharygneal  dysphagia. Pt has a slight delay in swallow initiation which  causes episodes of trace  penetration/aspiration with sensation  when bolus size with thin liquids is overlarge (straw sips and  when trying to take pills). The pt does not fully expel penetrate  or aspirate with cough. Pt typically does not use straws and  takes careful small sips at baseline. It is unlikely that  infrequent trace aspiration is impacting her pulmonary function  based on findings of today's study. Provided education  to pt  regarding limiting bolus size, avoiding straws and taking larger  pills in puree. No f/u therapy or diet modification is needed.     Treatment Recommendation  No treatment recommended at this time    Diet Recommendation Regular;Thin liquid   Liquid Administration via: Cup;No straw Medication Administration: Whole meds with puree Supervision: Patient able to self feed;Intermittent supervision  to cue for compensatory strategies Compensations: Slow rate;Small sips/bites;Follow solids with  liquid Postural Changes and/or Swallow Maneuvers: Seated upright 90  degrees    Other  Recommendations Oral Care Recommendations: Oral care Q4  per protocol;Oral care BID   Follow Up Recommendations  None    Frequency and Duration        Pertinent Vitals/Pain NA    SLP Swallow Goals     General HPI: 53 yo female with recent admission for CAP  re-presents with fever, leukocytosis, chest pain, and hypoxia in  setting of possible seizure vs pre-syncope. Intubated from 1/27  to 2/1. Type of Study: Modified Barium Swallowing Study Reason for Referral: Objectively evaluate swallowing function Previous Swallow Assessment: BSE 1/22 - suspected esopahgeal  dysphagia. Rec Dys 3/thin with esophageal precautions.  Diet Prior to this Study: Dysphagia 1 (puree) Temperature Spikes Noted: No Respiratory Status: Nasal cannula History of Recent Intubation: No Behavior/Cognition: Alert;Cooperative;Pleasant mood Oral Cavity - Dentition: Adequate natural dentition Self-Feeding Abilities: Able to feed self Patient Positioning: Upright in chair  Baseline Vocal Quality: Clear Volitional Cough: Strong Volitional Swallow: Able to elicit    Reason for Referral Objectively evaluate swallowing function   Oral Phase Oral Preparation/Oral Phase Oral Phase: WFL   Pharyngeal Phase Pharyngeal Phase Pharyngeal Phase: Impaired Pharyngeal - Thin Pharyngeal - Thin Cup: Within functional limits Pharyngeal - Thin Straw: Penetration/Aspiration before  swallow;Trace aspiration;Delayed swallow initiation Penetration/Aspiration details (thin straw): Material enters  airway, CONTACTS cords and not ejected out (despite cough  response) Pharyngeal - Solids Pharyngeal - Puree: Within functional limits Pharyngeal - Regular: Within functional limits Pharyngeal - Pill: Delayed swallow  initiation;Penetration/Aspiration before swallow;Trace aspiration Penetration/Aspiration details (pill): Material enters airway,  passes BELOW cords and not ejected out despite cough attempt by  patient  Cervical Esophageal Phase    GO    Cervical Esophageal Phase Cervical Esophageal Phase: WFL (Esophageal sweep did not reveal  any abnormality. No radiolog)        Herbie Baltimore, MA CCC-SLP [phone]  [name], Katherene Ponto 11/03/2013, 2:07 PM    ASSESSMENT / PLAN:  Acute hypoxemic respiratory failure with pulmonary infiltrates esp Bilateral UL and some mediastinal node Possible Connective tissue disease, NOS Also interestingly patient continues to c/o SOB despite her normal sats and no need for O2. This is highly unlikely to be ILD.  The infiltrate is clearing with diureses and clinical condition is improving with diureses.  Having said that, there is some LAN present in her mediastinum.  Sarcoidosis can not be ruled out (except no improvement with steroids).  Recommendations: - Diurese to dry weight and treat "CHF". - No need for home O2. - Severely depressed, with fibromyalgia, very vague complaints and deconditioned, would psych evaluation be of benefit here? Will defer to primary. - On  PO steroids, would taper quickly over the next 2 wks. - ABG on RA with respiratory alkalosis and hypoxemia, however this was pre diureses, if need to prove improvement in O2 then can consider repeat ABG but again of little value given that she is saturating 98% on RA (hypercarbia is not an issue here). -  Would prefer to biopsy objective lesions off steroids if and when they arise - if lungs get worse or Erythema Nodosum gets worse. - Agree with D/C of anti-bacterials but f/u on RVP. - SLP noted with some minor dysphagia. - Would recommend repeat CT in 4-6 wks to evaluate mediastinal LAN, if persists then will need biopsy either via EBUS or mediastinal LAN, decision can be made then. - Consult PT (she is deconditioned) - Please arrange f/u with PCCM as outpatient to evaluate CT when done. - PCCM will sign off, please call back if needed.  Rush Farmer, M.D. [facility] Pulmonary/Critical Care Medicine. Pager: [phone]. After hours pager: [phone].

## 2013-11-04 NOTE — Progress Notes (Signed)
Medical Student Daily Progress Note  Subjective: Patient feels better this morning, off O2 and denies SOB. Has been walking with PT.   Objective: Vital signs in last 24 hours: Filed Vitals:   11/04/13 0000 11/04/13 0340 11/04/13 0818 11/04/13 0958  BP: 111/67 122/74 121/79   Pulse: 55   111  Temp:  98.6 F (37 C) 98.3 F (36.8 C)   TempSrc:  Oral Oral   Resp: 20  20   Height:      Weight:      SpO2: 98% 96%     Weight change:   Intake/Output Summary (Last 24 hours) at 11/04/13 1121 Last data filed at 11/04/13 0900  Gross per 24 hour  Intake   1070 ml  Output   2800 ml  Net  -1730 ml   Physical Exam: Constitutional: Alert, in no acute distress and cooperative with exam.  Eyes: PERRLA, conjunctivae normal, no scleral icterus  Neck: Supple, Trachea midline  Cardiovascular: RRR, S1, S2 present, no murmurs DP 2+ b/l,  Pulmonary/Chest: normal respiratory effort, CTAB, no wheezes, rales, or rhonchi  Abdominal: Soft, nondistended, +suprapubic tenderness, +BS Neurological: Alert and responsive and able to follow commands; cranial nerves II-XII are grossly intact, moving all extremities.  GU: bilateral labial lesions with open sores  Skin: Warm, dry and intact.  Extremities: 2+ pitting edema bilaterally  Psychiatric: Normal mood and affect.   Micro Results: Recent Results (from the past 240 hour(s))  URINE CULTURE     Status: None   Collection Time    10/26/13  8:36 AM      Result Value Ref Range Status   Specimen Description URINE, RANDOM   Final   Special Requests NONE   Final   Culture  Setup Time     Final   Value: 10/26/2013 18:01     Performed at [facility]     Final   Value: NO GROWTH     Performed at [facility]   Culture     Final   Value: NO GROWTH     Performed at [facility]   Report Status 10/27/2013 FINAL   Final  CULTURE, BLOOD (ROUTINE X 2)     Status: None   Collection Time    11/02/13 12:35 AM       Result Value Ref Range Status   Specimen Description BLOOD RIGHT ANTECUBITAL   Final   Special Requests BOTTLES DRAWN AEROBIC ONLY 10CC   Final   Culture  Setup Time     Final   Value: 11/02/2013 14:36     Performed at [facility]   Culture     Final   Value:        BLOOD CULTURE RECEIVED NO GROWTH TO DATE CULTURE WILL BE HELD FOR 5 DAYS BEFORE ISSUING A FINAL NEGATIVE REPORT     Performed at [facility]   Report Status PENDING   Incomplete  CULTURE, BLOOD (ROUTINE X 2)     Status: None   Collection Time    11/02/13 12:40 AM      Result Value Ref Range Status   Specimen Description BLOOD ARM LEFT   Final   Special Requests BOTTLES DRAWN AEROBIC AND ANAEROBIC 5CC   Final   Culture  Setup Time     Final   Value: 11/02/2013 14:36     Performed at [place]     Final  Value:        BLOOD CULTURE RECEIVED NO GROWTH TO DATE CULTURE WILL BE HELD FOR 5 DAYS BEFORE ISSUING A FINAL NEGATIVE REPORT     Performed at [facility]   Report Status PENDING   Incomplete  URINE CULTURE     Status: None   Collection Time    11/02/13  3:34 AM      Result Value Ref Range Status   Specimen Description URINE, CLEAN CATCH   Final   Special Requests NONE   Final   Culture  Setup Time     Final   Value: 11/02/2013 14:53     Performed at [place]     Final   Value: NO GROWTH     Performed at [facility]   Culture     Final   Value: NO GROWTH     Performed at [facility]   Report Status 11/03/2013 FINAL   Final  MRSA PCR SCREENING     Status: None   Collection Time    11/02/13  5:44 AM      Result Value Ref Range Status   MRSA by PCR NEGATIVE  NEGATIVE Final   Comment:            The GeneXpert MRSA Assay (FDA     approved for NASAL specimens     only), is one component of a     comprehensive MRSA colonization     surveillance program. It is not     intended to diagnose MRSA     infection nor to guide  or     monitor treatment for     MRSA infections.  RESPIRATORY VIRUS PANEL     Status: None   Collection Time    11/02/13  7:37 AM      Result Value Ref Range Status   Source - RVPAN NASAL SWAB   Corrected   Comment: CORRECTED ON 02/16 AT 1808: PREVIOUSLY REPORTED AS NASAL SWAB   Respiratory Syncytial Virus A NOT DETECTED   Final   Respiratory Syncytial Virus B NOT DETECTED   Final   Influenza A NOT DETECTED   Final   Influenza B NOT DETECTED   Final   Parainfluenza 1 NOT DETECTED   Final   Parainfluenza 2 NOT DETECTED   Final   Parainfluenza 3 NOT DETECTED   Final   Metapneumovirus NOT DETECTED   Final   Rhinovirus NOT DETECTED   Final   Adenovirus NOT DETECTED   Final   Influenza A H1 NOT DETECTED   Final   Influenza A H3 NOT DETECTED   Final   Comment: (NOTE)           Normal Reference Range for each Analyte: NOT DETECTED     Testing performed using the Luminex xTAG Respiratory Viral Panel test     kit.     This test was developed and its performance characteristics determined     by [facility]. It has not been cleared or approved by the [place]     Food and Drug Administration. This test is used for clinical purposes.     It should not be regarded as investigational or for research. This     laboratory is certified under the [place] (CLIA) as qualified to perform high complexity     clinical laboratory testing.  Performed at [facility]   Studies/Results: Dg Chest Port 1 View  11/03/2013   CLINICAL DATA:  Pneumonia  EXAM: PORTABLE CHEST - 1 VIEW  COMPARISON:  Prior CT and radiograph from 11/02/2013  FINDINGS: The cardiac and mediastinal silhouettes are stable in size and contour, and remain within normal limits.  Lungs are normally inflated. Again seen are multi focal parenchymal infiltrates involving the bilateral upper lobes and left lower lobe, compatible with pneumonia. Overall, these are not significantly  changed. No new focal infiltrate. No pulmonary edema or pleural effusion. No pneumothorax.  Osseous structures are unchanged.  IMPRESSION: No significant interval change and bilateral upper and midlung irregular airspace opacities with more confluent left lower lobe opacity. These findings may be infectious or inflammatory in nature.   Electronically Signed   By: Jeannine Boga M.D.   On: 11/03/2013 06:03   Dg Swallowing Func-speech Pathology  11/03/2013   Katherene Ponto [name], CCC-SLP     11/03/2013  2:11 PM Objective Swallowing Evaluation: Modified Barium Swallowing Study   Patient Details  Name: Tennille Montelongo MRN: [id number] Date of Birth: [birth year]  Today's Date: 11/03/2013 Time: 1310-1340 SLP Time Calculation (min): 30 min  Past Medical History:  Past Medical History  Diagnosis Date  . Fibromyalgia   . Chronic pain   . Seizures   . Migraine   . Pneumonia    Past Surgical History: History reviewed. No pertinent past  surgical history. HPI: Felisia Balcom is a 53 y.o. [name] female with PMH  fibromyalgia, chronic pain with a recent admission for CAP  complicated by septic shock requiring ICU-level care  (1/27-2/10/15) presents to the ED with recurrent dyspnea       Assessment / Plan / Recommendation Clinical Impression  Dysphagia Diagnosis: Mild pharyngeal phase dysphagia Clinical impression: Pt presents with a mild oropharygneal  dysphagia. Pt has a slight delay in swallow initiation which  causes episodes of trace penetration/aspiration with sensation  when bolus size with thin liquids is overlarge (straw sips and  when trying to take pills). The pt does not fully expel penetrate  or aspirate with cough. Pt typically does not use straws and  takes careful small sips at baseline. It is unlikely that  infrequent trace aspiration is impacting her pulmonary function  based on findings of today's study. Provided education to pt  regarding limiting bolus size, avoiding straws and taking larger  pills in  puree. No f/u therapy or diet modification is needed.     Treatment Recommendation  No treatment recommended at this time    Diet Recommendation Regular;Thin liquid   Liquid Administration via: Cup;No straw Medication Administration: Whole meds with puree Supervision: Patient able to self feed;Intermittent supervision  to cue for compensatory strategies Compensations: Slow rate;Small sips/bites;Follow solids with  liquid Postural Changes and/or Swallow Maneuvers: Seated upright 90  degrees    Other  Recommendations Oral Care Recommendations: Oral care Q4  per protocol;Oral care BID   Follow Up Recommendations  None    Frequency and Duration        Pertinent Vitals/Pain NA    SLP Swallow Goals     General HPI: 53 yo female with recent admission for CAP  re-presents with fever, leukocytosis, chest pain, and hypoxia in  setting of possible seizure vs pre-syncope. Intubated from 1/27  to 2/1. Type of Study: Modified Barium Swallowing Study Reason for Referral: Objectively evaluate swallowing function Previous Swallow Assessment: BSE 1/22 - suspected esopahgeal  dysphagia. Rec Dys 3/thin  with esophageal precautions.  Diet Prior to this Study: Dysphagia 1 (puree) Temperature Spikes Noted: No Respiratory Status: Nasal cannula History of Recent Intubation: No Behavior/Cognition: Alert;Cooperative;Pleasant mood Oral Cavity - Dentition: Adequate natural dentition Self-Feeding Abilities: Able to feed self Patient Positioning: Upright in chair Baseline Vocal Quality: Clear Volitional Cough: Strong Volitional Swallow: Able to elicit    Reason for Referral Objectively evaluate swallowing function   Oral Phase Oral Preparation/Oral Phase Oral Phase: WFL   Pharyngeal Phase Pharyngeal Phase Pharyngeal Phase: Impaired Pharyngeal - Thin Pharyngeal - Thin Cup: Within functional limits Pharyngeal - Thin Straw: Penetration/Aspiration before  swallow;Trace aspiration;Delayed swallow initiation Penetration/Aspiration details (thin straw):  Material enters  airway, CONTACTS cords and not ejected out (despite cough  response) Pharyngeal - Solids Pharyngeal - Puree: Within functional limits Pharyngeal - Regular: Within functional limits Pharyngeal - Pill: Delayed swallow  initiation;Penetration/Aspiration before swallow;Trace aspiration Penetration/Aspiration details (pill): Material enters airway,  passes BELOW cords and not ejected out despite cough attempt by  patient  Cervical Esophageal Phase    GO    Cervical Esophageal Phase Cervical Esophageal Phase: WFL (Esophageal sweep did not reveal  any abnormality. No radiolog)        Herbie Baltimore, MA CCC-SLP [phone]  Othelia Pulling Katherene Ponto 11/03/2013, 2:07 PM    Medications: I have reviewed the patient's current medications. Scheduled Meds: . etodolac  500 mg Oral BID  . fentaNYL  50 mcg Transdermal Q72H  . folic acid  1 mg Oral Daily  . heparin  5,000 Units Subcutaneous 3 times per day  . insulin aspart  0-9 Units Subcutaneous TID WC  . levETIRAcetam  500 mg Oral BID  . pantoprazole  40 mg Oral BID  . predniSONE  30 mg Oral Q breakfast  . pregabalin  300 mg Oral BID  . vitamin B-12  2,000 mcg Oral Daily   Continuous Infusions:   PRN Meds:.SUMAtriptan, traMADol Assessment/Plan: Principal Problem:   Acute respiratory failure with hypoxia Active Problems:   Sepsis   Chronic pain   Seizure disorder   Fibromyalgia   Anemia of chronic disease   Severe sepsis(995.92)   HCAP (healthcare-associated pneumonia)   Connective tissue disease   ILD (interstitial lung disease)   Acute respiratory failure   LOS: 2 days   Braelee Herrle is a 53 y.o. [name] female with PMH fibromyalgia and ?seizure disorder with a recent admission for CAP complicated by septic shock requiring ICU-level care (1/27-2/10/15) who presented to the ED with recurrent dyspnea, improving.  #Acute respiratory failure - resolved. Off O2, off antibiotics. RVP negative, BC x2 NGTD. Legionalla/strep pneumo  negative.  -prednisone taper -titrate O2 to O2 sats > 92% -pulmonology critical care consulted, appreciate recs  #?Autoimmune Disease - p-ANCA positive; myeloperoxidase 1.2 (H); ESR 98 (but would expect this to be high in acute illness and is non-specific); ANA positive; c3/c4 negative; RF 13 (wnl); anti-DNA 1 (negative); CRP 23.8 (H); cryoglobulin negative; TSH/T4 low; ANA titer 1:80; p-ANCA titer 1:20 (nl=<1:20). CXR with evidence of hilar adenopathy, concerning for sarcoid.  Normal ACE level.  ?history of erythema nodsum. Family history of lupus in maternal grandmother. -consider lung biopsy -prednisone taper  #Hyperglycemia: likely due to high dose steroids -continue current insulin regimen   #Fibromyalgia/ Chronic Pain. Patient with chronic back pain in addition to diffuse abdominal pain.  -continue lyrica 344m BID -continue tramadol 573mBID -continue fentanyl patch  #History of migraines -imitrex PRN   #Seizure disorder - No seizures activity this admission  -  continue keppra   #Deconditioning - PT recommends HH PT  -continue PT/OT   #Anemia: Patient has low folate and B12, also with low ferretin.  -continue folate and B12 supplementation   Prophylaxis: -heparin -protonix 50m BID  #Dispo- Disposition is deferred at this time, awaiting improvement of current medical problems. Anticipated discharge in approximately 1-2 day(s).   This is a MCareers information officerNote.  The care of the patient was discussed with Dr. GGordy Levanand the assessment and plan formulated with their assistance.  Please see their attached note for official documentation of the daily encounter.  WJake BatheM 11/04/2013, 11:21 AM

## 2013-11-04 NOTE — Progress Notes (Signed)
Physical Therapy Treatment Patient Details Name: [name] [name] MRN: [id number] DOB: 06/09/1961 Today's Date: 11/04/2013 Time: [id number] PT Time Calculation (min): 25 min  PT Assessment / Plan / Recommendation  History of Present Illness [name] [name] is a 53 y.o. [place] with PMH fibromyalgia, chronic pain  with a recent admission for CAP complicated by septic shock requiring ICU-level care (1/27-2/10/15) presents to the ED with recurrent dyspnea   PT Comments   Pt progressing with mobility able to ambulate twice as far today with only 2 standing rest breaks. Pt in positive spirits and no SoB with gait but 1/4 with HEP requiring pause during exercise. Pt encouraged to continue gait and HEP throughout stay. Will follow.   Follow Up Recommendations  Home health PT;Supervision/Assistance - 24 hour     Does the patient have the potential to tolerate intense rehabilitation     Barriers to Discharge        Equipment Recommendations       Recommendations for Other Services    Frequency     Progress towards PT Goals Progress towards PT goals: Progressing toward goals  Plan Current plan remains appropriate    Precautions / Restrictions Precautions Precautions: Fall Restrictions Weight Bearing Restrictions: No   Pertinent Vitals/Pain Hr 90-111 with gait, 77 at rest Pt reports mild chronic back pain    Mobility  Bed Mobility Overal bed mobility: Modified Independent Transfers Overall transfer level: Modified independent Ambulation/Gait Ambulation/Gait assistance: Supervision Ambulation Distance (Feet): 300 Feet Assistive device: Rolling walker (2 wheeled) Gait velocity interpretation: <1.8 ft/sec, indicative of risk for recurrent falls General Gait Details: cues for position in RW and to increase stride length as well as directional cues    Exercises General Exercises - Lower Extremity Long Arc Quad: AROM;Seated;Both;20 reps Hip ABduction/ADduction: AROM;Seated;Both;20  reps Hip Flexion/Marching: AROM;Seated;Both;20 reps Toe Raises: AROM;Seated;Both;20 reps Heel Raises: AROM;Seated;Both;20 reps   PT Diagnosis:    PT Problem List:   PT Treatment Interventions:     PT Goals (current goals can now be found in the care plan section)    Visit Information  Last PT Received On: 11/04/13 Assistance Needed: +1 History of Present Illness: [name] [name] is a 53 y.o. [place] female with PMH fibromyalgia, chronic pain  with a recent admission for CAP complicated by septic shock requiring ICU-level care (1/27-2/10/15) presents to the ED with recurrent dyspnea    Subjective Data      Cognition  Cognition Arousal/Alertness: Awake/alert Behavior During Therapy: WFL for tasks assessed/performed Overall Cognitive Status: Within Functional Limits for tasks assessed    Balance     End of Session PT - End of Session Activity Tolerance: Patient tolerated treatment well Patient left: in chair;with call bell/phone within reach Nurse Communication: Mobility status   GP     [name], [name] [name] 11/04/2013, 10:04 AM [name] [name], PT [phone]

## 2013-11-04 NOTE — Progress Notes (Signed)
I repeated the critical or key portions of the exam.  I confirmed/revised the medical student's history, exam, assessment and plan.   

## 2013-11-04 NOTE — Progress Notes (Signed)
Subjective:   Pt reports feeling better this AM and is sitting up in the chair.    Objective:   Vital signs in last 24 hours: Filed Vitals:   11/04/13 0958 11/04/13 1141 11/04/13 1147 11/04/13 1410  BP:   109/61 96/55  Pulse: 111   61  Temp:  98.7 F (37.1 C)  98.1 F (36.7 C)  TempSrc:  Oral  Oral  Resp:  '14 13 20  ' Height:      Weight:      SpO2:  95%  68%   Weight change:   Intake/Output Summary (Last 24 hours) at 11/04/13 1923 Last data filed at 11/04/13 1300  Gross per 24 hour  Intake    730 ml  Output   1100 ml  Net   -370 ml    Physical Exam: Constitutional: Vital signs reviewed.  Patient is in no acute distress and cooperative with exam.   Head: Normocephalic and atraumatic Eyes: PERRL, EOMI, conjunctivae normal, no scleral icterus  Neck: Supple, Trachea midline Cardiovascular: RRR, S1, S2 present, no MRG, DP 2+b/l Pulmonary/Chest: normal respiratory effort, diffuse mild crackles, no wheezes, rales, or rhonchi Abdominal: Soft. +BS, Non-tender, non-distended Neurological: A&O x3, cranial nerve II-XII are grossly intact, moving all extremities  Skin: Warm, dry and intact. No rash Psychiatric: Normal mood and affect.   Lab Results:  BMP:  Recent Labs Lab 11/02/13 0838 11/03/13 0350 11/04/13 0315  NA 145 141 146  K 4.1 3.9 3.4*  CL 109 105 104  CO2 '23 23 28  ' GLUCOSE 117* 128* 134*  BUN '9 15 22  ' CREATININE 0.52 0.57 0.73  CALCIUM 8.1* 8.7 8.4  MG 1.6  --  1.7    CBC:  Recent Labs Lab 11/02/13 0035 11/03/13 0350 11/03/13 2210  WBC 32.7* 9.7 8.9  NEUTROABS 30.4*  --   --   HGB 10.8* 9.7* 9.2*  HCT 33.6* 30.9* 29.0*  MCV 89.1 89.3 87.6  PLT 310 223 231    Coagulation: No results found for this basename: LABPROT, INR,  in the last 168 hours  CBG:            Recent Labs Lab 11/03/13 1120 11/03/13 1548 11/03/13 2136 11/04/13 0817 11/04/13 1143 11/04/13 1645  GLUCAP 191* 146* 158* 106* 140* 166*           HA1C:      No  results found for this basename: HGBA1C,  in the last 168 hours  Lipid Panel: No results found for this basename: CHOL, HDL, LDLCALC, TRIG, CHOLHDL, LDLDIRECT,  in the last 168 hours  LFTs:  Recent Labs Lab 11/02/13 0035  AST 25  ALT 31  ALKPHOS 143*  BILITOT 0.6  PROT 7.0  ALBUMIN 2.5*    Pancreatic Enzymes: No results found for this basename: LIPASE, AMYLASE,  in the last 168 hours  Ammonia: No results found for this basename: AMMONIA,  in the last 168 hours  Cardiac Enzymes:  Recent Labs Lab 11/03/13 0925 11/03/13 1620 11/03/13 2015  TROPONINI <0.30 <0.30 <0.30   Lab Results  Component Value Date   CKTOTAL 31 10/25/2013   TROPONINI <0.30 11/03/2013    EKG:  Date/Time:  Sunday November 02 2013 04:32:38 EST Ventricular Rate:  91 PR Interval:  122 QRS Duration: 73 QT Interval:  354 QTC Calculation: 435 R Axis:   82 Text Interpretation:  Sinus rhythm ED PHYSICIAN INTERPRETATION AVAILABLE IN [facility] Confirmed by TEST, RECORD ([id number]) on 11/04/2013 7:05:54 AM  BNP:  Recent Labs Lab 11/02/13 1450  PROBNP 3533.0*    D-Dimer: No results found for this basename: DDIMER,  in the last 168 hours  Urinalysis:  Recent Labs Lab 11/02/13 0334  COLORURINE YELLOW  LABSPEC 1.020  PHURINE 6.0  GLUCOSEU NEGATIVE  HGBUR NEGATIVE  BILIRUBINUR MODERATE*  KETONESUR NEGATIVE  PROTEINUR NEGATIVE  UROBILINOGEN 0.2  NITRITE NEGATIVE  LEUKOCYTESUR NEGATIVE    Micro Results: Recent Results (from the past 240 hour(s))  URINE CULTURE     Status: None   Collection Time    10/26/13  8:36 AM      Result Value Ref Range Status   Specimen Description URINE, RANDOM   Final   Special Requests NONE   Final   Culture  Setup Time     Final   Value: 10/26/2013 18:01     Performed at [facility]     Final   Value: NO GROWTH     Performed at [facility]   Culture     Final   Value: NO GROWTH     Performed at [facility]    Report Status 10/27/2013 FINAL   Final  CULTURE, BLOOD (ROUTINE X 2)     Status: None   Collection Time    11/02/13 12:35 AM      Result Value Ref Range Status   Specimen Description BLOOD RIGHT ANTECUBITAL   Final   Special Requests BOTTLES DRAWN AEROBIC ONLY 10CC   Final   Culture  Setup Time     Final   Value: 11/02/2013 14:36     Performed at [facility]   Culture     Final   Value:        BLOOD CULTURE RECEIVED NO GROWTH TO DATE CULTURE WILL BE HELD FOR 5 DAYS BEFORE ISSUING A FINAL NEGATIVE REPORT     Performed at [facility]   Report Status PENDING   Incomplete  CULTURE, BLOOD (ROUTINE X 2)     Status: None   Collection Time    11/02/13 12:40 AM      Result Value Ref Range Status   Specimen Description BLOOD ARM LEFT   Final   Special Requests BOTTLES DRAWN AEROBIC AND ANAEROBIC 5CC   Final   Culture  Setup Time     Final   Value: 11/02/2013 14:36     Performed at [facility]   Culture     Final   Value:        BLOOD CULTURE RECEIVED NO GROWTH TO DATE CULTURE WILL BE HELD FOR 5 DAYS BEFORE ISSUING A FINAL NEGATIVE REPORT     Performed at [facility]   Report Status PENDING   Incomplete  URINE CULTURE     Status: None   Collection Time    11/02/13  3:34 AM      Result Value Ref Range Status   Specimen Description URINE, CLEAN CATCH   Final   Special Requests NONE   Final   Culture  Setup Time     Final   Value: 11/02/2013 14:53     Performed at [facility]     Final   Value: NO GROWTH     Performed at [facility]   Culture     Final   Value: NO GROWTH     Performed at [facility]   Report Status 11/03/2013 FINAL  Final  MRSA PCR SCREENING     Status: None   Collection Time    11/02/13  5:44 AM      Result Value Ref Range Status   MRSA by PCR NEGATIVE  NEGATIVE Final   Comment:            The GeneXpert MRSA Assay (FDA     approved for NASAL specimens     only), is one component of  a     comprehensive MRSA colonization     surveillance program. It is not     intended to diagnose MRSA     infection nor to guide or     monitor treatment for     MRSA infections.  RESPIRATORY VIRUS PANEL     Status: None   Collection Time    11/02/13  7:37 AM      Result Value Ref Range Status   Source - RVPAN NASAL SWAB   Corrected   Comment: CORRECTED ON 02/16 AT 1808: PREVIOUSLY REPORTED AS NASAL SWAB   Respiratory Syncytial Virus A NOT DETECTED   Final   Respiratory Syncytial Virus B NOT DETECTED   Final   Influenza A NOT DETECTED   Final   Influenza B NOT DETECTED   Final   Parainfluenza 1 NOT DETECTED   Final   Parainfluenza 2 NOT DETECTED   Final   Parainfluenza 3 NOT DETECTED   Final   Metapneumovirus NOT DETECTED   Final   Rhinovirus NOT DETECTED   Final   Adenovirus NOT DETECTED   Final   Influenza A H1 NOT DETECTED   Final   Influenza A H3 NOT DETECTED   Final   Comment: (NOTE)           Normal Reference Range for each Analyte: NOT DETECTED     Testing performed using the Luminex xTAG Respiratory Viral Panel test     kit.     This test was developed and its performance characteristics determined     by [facility]. It has not been cleared or approved by the [place]     Food and Drug Administration. This test is used for clinical purposes.     It should not be regarded as investigational or for research. This     laboratory is certified under the [place] (CLIA) as qualified to perform high complexity     clinical laboratory testing.     Performed at [facility]    Blood Culture:    Component Value Date/Time   SDES URINE, RANDOM 11/02/2013 0552   SPECREQUEST NONE 11/02/2013 0552   CULT  Value: NO GROWTH Performed at [facility] 11/02/2013 0334   REPTSTATUS 11/03/2013 FINAL 11/02/2013 0552    Studies/Results: Dg Chest Port 1 View  11/03/2013   CLINICAL DATA:  Pneumonia  EXAM: PORTABLE CHEST -  1 VIEW  COMPARISON:  Prior CT and radiograph from 11/02/2013  FINDINGS: The cardiac and mediastinal silhouettes are stable in size and contour, and remain within normal limits.  Lungs are normally inflated. Again seen are multi focal parenchymal infiltrates involving the bilateral upper lobes and left lower lobe, compatible with pneumonia. Overall, these are not significantly changed. No new focal infiltrate. No pulmonary edema or pleural effusion. No pneumothorax.  Osseous structures are unchanged.  IMPRESSION: No significant interval change and bilateral upper and midlung irregular airspace opacities with more confluent left lower lobe opacity. These  findings may be infectious or inflammatory in nature.   Electronically Signed   By: Jeannine Boga M.D.   On: 11/03/2013 06:03   Dg Swallowing Func-speech Pathology  11/03/2013   Katherene Ponto [name], CCC-SLP     11/03/2013  2:11 PM Objective Swallowing Evaluation: Modified Barium Swallowing Study   Patient Details  Name: [name] [name] MRN: [id number] Date of Birth: 07/05/1961  Today's Date: 11/03/2013 Time: 1310-1340 SLP Time Calculation (min): 30 min  Past Medical History:  Past Medical History  Diagnosis Date  . Fibromyalgia   . Chronic pain   . Seizures   . Migraine   . Pneumonia    Past Surgical History: History reviewed. No pertinent past  surgical history. HPI: Ashleymarie Granderson is a 73 y.o. [name] female with PMH  fibromyalgia, chronic pain with a recent admission for CAP  complicated by septic shock requiring ICU-level care  (1/27-2/10/15) presents to the ED with recurrent dyspnea       Assessment / Plan / Recommendation Clinical Impression  Dysphagia Diagnosis: Mild pharyngeal phase dysphagia Clinical impression: Pt presents with a mild oropharygneal  dysphagia. Pt has a slight delay in swallow initiation which  causes episodes of trace penetration/aspiration with sensation  when bolus size with thin liquids is overlarge (straw sips and  when trying  to take pills). The pt does not fully expel penetrate  or aspirate with cough. Pt typically does not use straws and  takes careful small sips at baseline. It is unlikely that  infrequent trace aspiration is impacting her pulmonary function  based on findings of today's study. Provided education to pt  regarding limiting bolus size, avoiding straws and taking larger  pills in puree. No f/u therapy or diet modification is needed.     Treatment Recommendation  No treatment recommended at this time    Diet Recommendation Regular;Thin liquid   Liquid Administration via: Cup;No straw Medication Administration: Whole meds with puree Supervision: Patient able to self feed;Intermittent supervision  to cue for compensatory strategies Compensations: Slow rate;Small sips/bites;Follow solids with  liquid Postural Changes and/or Swallow Maneuvers: Seated upright 90  degrees    Other  Recommendations Oral Care Recommendations: Oral care Q4  per protocol;Oral care BID   Follow Up Recommendations  None    Frequency and Duration        Pertinent Vitals/Pain NA    SLP Swallow Goals     General HPI: 53 yo female with recent admission for CAP  re-presents with fever, leukocytosis, chest pain, and hypoxia in  setting of possible seizure vs pre-syncope. Intubated from 1/27  to 2/1. Type of Study: Modified Barium Swallowing Study Reason for Referral: Objectively evaluate swallowing function Previous Swallow Assessment: BSE 1/22 - suspected esopahgeal  dysphagia. Rec Dys 3/thin with esophageal precautions.  Diet Prior to this Study: Dysphagia 1 (puree) Temperature Spikes Noted: No Respiratory Status: Nasal cannula History of Recent Intubation: No Behavior/Cognition: Alert;Cooperative;Pleasant mood Oral Cavity - Dentition: Adequate natural dentition Self-Feeding Abilities: Able to feed self Patient Positioning: Upright in chair Baseline Vocal Quality: Clear Volitional Cough: Strong Volitional Swallow: Able to elicit    Reason for Referral  Objectively evaluate swallowing function   Oral Phase Oral Preparation/Oral Phase Oral Phase: WFL   Pharyngeal Phase Pharyngeal Phase Pharyngeal Phase: Impaired Pharyngeal - Thin Pharyngeal - Thin Cup: Within functional limits Pharyngeal - Thin Straw: Penetration/Aspiration before  swallow;Trace aspiration;Delayed swallow initiation Penetration/Aspiration details (thin straw): Material enters  airway, CONTACTS cords and not ejected out (despite cough  response) Pharyngeal - Solids Pharyngeal - Puree: Within functional limits Pharyngeal - Regular: Within functional limits Pharyngeal - Pill: Delayed swallow  initiation;Penetration/Aspiration before swallow;Trace aspiration Penetration/Aspiration details (pill): Material enters airway,  passes BELOW cords and not ejected out despite cough attempt by  patient  Cervical Esophageal Phase    GO    Cervical Esophageal Phase Cervical Esophageal Phase: WFL (Esophageal sweep did not reveal  any abnormality. No radiolog)        Herbie Baltimore, MA CCC-SLP [phone]  Othelia Pulling Katherene Ponto 11/03/2013, 2:07 PM     Medications:  Scheduled Meds: . etodolac  500 mg Oral BID  . fentaNYL  50 mcg Transdermal Q72H  . folic acid  1 mg Oral Daily  . heparin  5,000 Units Subcutaneous 3 times per day  . insulin aspart  0-9 Units Subcutaneous TID WC  . levETIRAcetam  500 mg Oral BID  . pantoprazole  40 mg Oral BID  . potassium chloride SA  40 mEq Oral Once  . [START ON 11/05/2013] potassium chloride SA  40 mEq Oral Daily  . predniSONE  30 mg Oral Q breakfast  . pregabalin  300 mg Oral BID  . vitamin B-12  2,000 mcg Oral Daily   Continuous Infusions:  PRN Meds:.SUMAtriptan, traMADol  Antibiotics: Anti-infectives   Start     Dose/Rate Route Frequency Ordered Stop   11/02/13 1730  levofloxacin (LEVAQUIN) IVPB 750 mg  Status:  Discontinued     750 mg 100 mL/hr over 90 Minutes Intravenous Every 24 hours 11/02/13 1625 11/03/13 1013   11/02/13 1200  vancomycin (VANCOCIN)  IVPB 750 mg/150 ml premix  Status:  Discontinued     750 mg 150 mL/hr over 60 Minutes Intravenous Every 12 hours 11/02/13 0602 11/03/13 1013   11/02/13 1000  valACYclovir (VALTREX) tablet 1,000 mg  Status:  Discontinued     1,000 mg Oral 2 times daily 11/02/13 0324 11/03/13 1013   11/02/13 0600  ceFEPIme (MAXIPIME) 1 g in dextrose 5 % 50 mL IVPB  Status:  Discontinued     1 g 100 mL/hr over 30 Minutes Intravenous 3 times per day 11/02/13 0537 11/03/13 1013   11/02/13 0045  ceFEPIme (MAXIPIME) 2 g in dextrose 5 % 50 mL IVPB     2 g 100 mL/hr over 30 Minutes Intravenous  Once 11/02/13 0035 11/02/13 0123   11/02/13 0045  vancomycin (VANCOCIN) IVPB 1000 mg/200 mL premix     1,000 mg 200 mL/hr over 60 Minutes Intravenous  Once 11/02/13 0035 11/02/13 0211     Antibiotics Given (last 72 hours)   Date/Time Action Medication Dose Rate   11/02/13 1052 Given   valACYclovir (VALTREX) tablet 1,000 mg 1,000 mg    11/02/13 1358 Given   ceFEPIme (MAXIPIME) 1 g in dextrose 5 % 50 mL IVPB 1 g 100 mL/hr   11/02/13 1439 Given   vancomycin (VANCOCIN) IVPB 750 mg/150 ml premix 750 mg 150 mL/hr   11/02/13 1729 Given   levofloxacin (LEVAQUIN) IVPB 750 mg 750 mg 100 mL/hr   11/02/13 2151 Given   valACYclovir (VALTREX) tablet 1,000 mg 1,000 mg    11/02/13 2152 Given   ceFEPIme (MAXIPIME) 1 g in dextrose 5 % 50 mL IVPB 1 g 100 mL/hr   11/02/13 2348 Given   vancomycin (VANCOCIN) IVPB 750 mg/150 ml premix 750 mg 150 mL/hr   11/03/13 0631 Given   ceFEPIme (MAXIPIME) 1 g in dextrose 5 % 50 mL IVPB 1 g 100 mL/hr  Day of Hospitalization:  2 Consults:    Assessment/Plan:   Principal Problem:   Acute respiratory failure with hypoxia Active Problems:   Sepsis   Chronic pain   Seizure disorder   Fibromyalgia   Anemia of chronic disease   Severe sepsis(995.92)   HCAP (healthcare-associated pneumonia)   Connective tissue disease   ILD (interstitial lung disease)   Acute respiratory  failure  #Acute respiratory failure - Resolved.   -appreciate PCCM recs -transfer from SDU to telemetry  #HCAP with sepsis - Resolved. Pt has been treated adequately for pneumonia in her previous 2 hospitalizations--this is her third for the same admission.  Respiratory virus panel negative.  BCx x2 NGTD. Legionella/strep pneumonia negative.  -Sputum culture pending -continue cardiac monitoring    #Pleuritic chest pain radiating to back - Resolved. No history of CAD. Troponins x 3 negative.    #?Autoimmune disease - Last admit she was found to be ANA positive with titer of 1:80 and mildly p-ANCA positive with titer of 1:20. Myeloperoxidase was 1.2 (H); ESR 98, and CRP 23.8 RF, anti-DNA 1, cryoglobulin, ACE, c3/c4 were all within normal limits. TSH/T4 were low, likely due to her acute illness. 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 Vasculitis is less likely due to only mildly elevated p-ANCA, likely clinically insignificant. Outpatient rheum and pulm follow up had been planned but pt readmitted.  PCCM has signed off and has made a f/u appt on 2/20.  They recommend steroid taper.  - continue taper of steroids  #?Seizure disorder -  - Continue Keppra 500 mg po twice a day   #Fibromyalgia and Chronic Pain Syndrome - Stable.  -continue fentanyl patch, etodolac (+protonix), tramadol, lyrica.   #Hypokalemia -  -continue to monitor and replete as necessary  #Suprapubic pain - Resolved.  Urine culture 10/26/2013 showed no growth.  2/15 urine culture shows NG.   #Gential herpes - Resolved. Treated during last hospitalization.  HSV 1 & 2 IgG were positive last admit. Denies recent flare.   #Normocytic Anemia - Last admit patient was found to have Vit B12  and folate deficiencies in addition to high ferretin indicating anemia of chronic disease. She was provided B12 and folate supplementation. Hemoglobin today is 10.8.  - Continue folate 1 mg daily  - Continue B12 supplements 2000 mcg daily   #Vitamin D deficiency - -Continue vitamin D 50,000 units every 7 days, last dose was 11/01/13   #GERD - Continue protonix 40 mg twice a day   #DVT PPX - Heparin subcutaneous  #Dispo- Disposition is deferred at this time, awaiting improvement of current medical problems.  Anticipated discharge in approximately 1-2 day(s).    LOS: 2 days   Michail Jewels, MD PGY-1, Internal Medicine  [phone] (7AM-5PM Mon-Fri) 11/04/2013, 7:23 PM

## 2013-11-05 ENCOUNTER — Inpatient Hospital Stay (HOSPITAL_COMMUNITY): Payer: Medicaid Other

## 2013-11-05 DIAGNOSIS — J189 Pneumonia, unspecified organism: Secondary | ICD-10-CM

## 2013-11-05 DIAGNOSIS — J984 Other disorders of lung: Secondary | ICD-10-CM

## 2013-11-05 DIAGNOSIS — R7309 Other abnormal glucose: Secondary | ICD-10-CM

## 2013-11-05 DIAGNOSIS — R0989 Other specified symptoms and signs involving the circulatory and respiratory systems: Secondary | ICD-10-CM

## 2013-11-05 DIAGNOSIS — R0609 Other forms of dyspnea: Secondary | ICD-10-CM

## 2013-11-05 LAB — BASIC METABOLIC PANEL
BUN: 17 mg/dL (ref 6–23)
CALCIUM: 8.4 mg/dL (ref 8.4–10.5)
CO2: 27 meq/L (ref 19–32)
Chloride: 102 mEq/L (ref 96–112)
Creatinine, Ser: 0.64 mg/dL (ref 0.50–1.10)
GFR calc Af Amer: >90 mL/min (ref >90)
GFR calc non Af Amer: >90 mL/min (ref >90)
Glucose, Bld: 82 mg/dL (ref 70–99)
Potassium: 3.4 mEq/L — ABNORMAL LOW (ref 3.7–5.3)
SODIUM: 141 meq/L (ref 137–147)

## 2013-11-05 LAB — GLUCOSE, CAPILLARY
GLUCOSE-CAPILLARY: 100 mg/dL — AB (ref 70–99)
GLUCOSE-CAPILLARY: 124 mg/dL — AB (ref 70–99)
GLUCOSE-CAPILLARY: 136 mg/dL — AB (ref 70–99)

## 2013-11-05 MED ORDER — POTASSIUM CHLORIDE CRYS ER 20 MEQ PO TBCR
40.0000 meq | EXTENDED_RELEASE_TABLET | Freq: Two times a day (BID) | ORAL | Status: DC
  Administered 2013-11-05: 40 meq via ORAL
  Filled 2013-11-05 (×3): qty 2

## 2013-11-05 MED ORDER — ENOXAPARIN SODIUM 40 MG/0.4ML ~~LOC~~ SOLN
40.0000 mg | SUBCUTANEOUS | Status: DC
  Administered 2013-11-06: 40 mg via SUBCUTANEOUS
  Filled 2013-11-05: qty 0.4

## 2013-11-05 MED ORDER — PREGABALIN 100 MG PO CAPS
300.0000 mg | ORAL_CAPSULE | Freq: Two times a day (BID) | ORAL | Status: DC
  Administered 2013-11-05 – 2013-11-06 (×3): 300 mg via ORAL
  Filled 2013-11-05 (×3): qty 3

## 2013-11-05 MED ORDER — PANTOPRAZOLE SODIUM 40 MG PO TBEC
40.0000 mg | DELAYED_RELEASE_TABLET | Freq: Every day | ORAL | Status: DC
  Administered 2013-11-06: 40 mg via ORAL
  Filled 2013-11-05: qty 1

## 2013-11-05 NOTE — Progress Notes (Signed)
I confirmed/revised the medical student's history, exam, assessment and plan.

## 2013-11-05 NOTE — Progress Notes (Signed)
PULMONARY / CRITICAL CARE MEDICINE  Name: [name] [name] MRN: [id number] DOB: [birth year]    ADMISSION DATE:  11/02/2013 CONSULTATION DATE:  11/02/13   REFERRING MD :  [name] [name]  PRIMARY SERVICE: IMTS   CHIEF COMPLAINT:  Respiratory distress  BRIEF PATIENT DESCRIPTION:   [name] [name] is a 53 y.o. [name] female with PMH fibromyalgia, chronic pain  with a recent admission for CAP complicated by septic shock requiring ICU-level care (1/27-2/10/15) presented to the ED 2/15 with recurrent dyspnea.    SIGNIFICANT EVENTS / STUDIES:  10/14/13 - ADMIT 1/281/28 Abd [place] >>No hydronephrosis, Left kidney 74m stone?, Right kidney 649mstone? Mild intra and extrahepatic biliary ductal prominence,  1/28 CTA chest >>> No PE. Bilateral prominently upper lobe mixed airspace and ground-glass opacities, multi lobar pneumonia is favored. Mediastinal and bilateral hilar adenopathy  1/28 Bronchoscopy >>> unimpressive secretions, no DAH, no hsv lesions, no mass lesions, BAL sent.  1/28 TTE >>> EF 65-70%, grade 1 DD, PA peak 38, no change from prior  10/15/13 BRONCH BAL - 93% POLYS (Ddx is acute infection, AIP, aspiration) 1/28 CT head >>> Negative for acute process.  1/29 EEG >>> no evidence of seizure activity  1/31 2. 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 Rest negative. Resp VIrus PCR and culture - negative 2/4 off precedex overnight, improved orientation   TTE >>> EF 6588%grade 1 systolic dysfunction 2/11/17/52- DC on 8 day pred taper. Did NOT go  Home on oxygen 11/02/13 - readmitted for dysnea, BNP 3000. Diuresed .................... Marland KitchenEADMIT 11/02/2013 2/15  Chest CT >>>  No pulmonary embolism, persistent upper lobe predominant bilateral airspace and ground-glass opacities, improved relative to most recent CT from 1/28, mediastinal and bilateral adenopathy is above,  slightly decreased in size relative to prior.  LINES / TUBES:  CULTURES: 2/15 UC > NEG 2/15 BC > NGTD 2/15 Viral panel > NEG 2/15 Sputum  >>  ANTIBIOTICS: Maxipime 2/15 > 2/17 Vanc 2/15 > 2/17   INTERVAL HISTORY:  11/05/13: She has been off supplemental O2 for at least 24 hours and states that she still gets DOE. She also describes DOE and nocturnal dyspnea at home. She and family think she would benefit from home O2 PRN.    VITAL SIGNS: Temp:  [98.1 F (36.7 C)-98.4 F (36.9 C)] 98.1 F (36.7 C) (02/18 0606) Pulse Rate:  [52-61] 52 (02/18 0606) Resp:  [18-20] 20 (02/18 0606) BP: (96-119)/(55-68) 119/68 mmHg (02/18 0606) SpO2:  [68 %-100 %] 100 % (02/18 0606) HEMODYNAMICS:        INTAKE / OUTPUT: Intake/Output     02/17 0701 - 02/18 0700 02/18 0701 - 02/19 0700   P.O. 720 240   Total Intake(mL/kg) 720 (12.4) 240 (4.1)   Urine (mL/kg/hr) 400 (0.3)    Total Output 400     Net +320 +240        Urine Occurrence 4 x      PHYSICAL EXAMINATION: General:  Female of normal body habitus in NAD  Neuro:  Alert, anxious  HEENT: poor dentition , dry mucosa  Cardiovascular: RRR , no m/r/g  Lungs: Anxious. Normal WOB. Some crackles esp bilateral UL Abdomen: soft, BS + , NT , no guarding or rebound  Musculoskeletal:  gen weakness, maew, no joint deformity noted.  Skin:  Intact w/out rash  LABS:  CBC  Recent Labs Lab 11/02/13 0035 11/03/13 0350 11/03/13 2210  WBC 32.7* 9.7 8.9  HGB 10.8* 9.7* 9.2*  HCT 33.6* 30.9* 29.0*  PLT 310 223 231   Coag's No results found for this basename: APTT, INR,  in the last 168 hours  BMET  Recent Labs Lab 11/03/13 0350 11/04/13 0315 11/05/13 0745  NA 141 146 141  K 3.9 3.4* 3.4*  CL 105 104 102  CO2 '23 28 27  ' BUN '15 22 17  ' CREATININE 0.57 0.73 0.64  GLUCOSE 128* 134* 82   Electrolytes  Recent Labs Lab 11/02/13 0838 11/03/13 0350 11/04/13 0315 11/05/13 0745  CALCIUM 8.1* 8.7 8.4 8.4  MG 1.6  --  1.7  --     Sepsis Markers  Recent Labs Lab 11/02/13 0047 11/02/13 0248 11/02/13 0838 11/03/13 0350 11/03/13 1049 11/04/13 0315  LATICACIDVEN 2.88*  --  1.9 1.3  --   --   PROCALCITON  --  0.97  --   --  0.69 0.44   ABG  Recent Labs Lab 11/02/13 0055  PHART 7.519*  PCO2ART 30.8*  PO2ART 57.0*   Liver Enzymes  Recent Labs Lab 11/02/13 0035  AST 25  ALT 31  ALKPHOS 143*  BILITOT 0.6  ALBUMIN 2.5*   Cardiac Enzymes  Recent Labs Lab 11/02/13 1450  11/03/13 0925 11/03/13 1620 11/03/13 2015  TROPONINI <0.30  < > <0.30 <0.30 <0.30  PROBNP 3533.0*  --   --   --   --   < > = values in this interval not displayed. Glucose  Recent Labs Lab 11/04/13 0817 11/04/13 1143 11/04/13 1645 11/04/13 2056 11/05/13 0605 11/05/13 1117  GLUCAP 106* 140* 166* 110* 100* 124*   Imaging Dg Swallowing Func-speech Pathology  11/03/2013   [name] [name], CCC-SLP     11/03/2013  2:11 PM Objective Swallowing Evaluation: Modified Barium Swallowing Study   Patient Details  Name: [name] [name] MRN: [id number] Date of Birth: [birth year]  Today's Date: 11/03/2013 Time: 1310-1340 SLP Time Calculation (min): 30 min  Past Medical History:  Past Medical History  Diagnosis Date  . Fibromyalgia   . Chronic pain   . Seizures   . Migraine   . Pneumonia    Past Surgical History: History reviewed. No pertinent past  surgical history. HPI: [name] [name] is a 53 y.o. [name] female with PMH  fibromyalgia, chronic pain with a recent admission for CAP  complicated by septic shock requiring ICU-level care  (1/27-2/10/15) presents to the ED with recurrent dyspnea       Assessment / Plan / Recommendation Clinical Impression  Dysphagia Diagnosis: Mild pharyngeal phase dysphagia Clinical impression: Pt presents with a mild oropharygneal  dysphagia. Pt has a slight delay in swallow initiation which  causes episodes of trace penetration/aspiration with sensation  when bolus size with thin liquids is overlarge (straw  sips and  when trying to take pills). The pt does not fully expel penetrate  or aspirate with cough. Pt typically does not use straws and  takes careful small sips at baseline. It is unlikely that  infrequent trace aspiration is impacting her pulmonary function  based on findings of today's study. Provided education to pt  regarding limiting bolus size, avoiding straws and taking larger  pills in puree. No f/u therapy or diet modification is needed.     Treatment Recommendation  No treatment recommended at this time    Diet Recommendation Regular;Thin liquid   Liquid Administration via: Cup;No  straw Medication Administration: Whole meds with puree Supervision: Patient able to self feed;Intermittent supervision  to cue for compensatory strategies Compensations: Slow rate;Small sips/bites;Follow solids with  liquid Postural Changes and/or Swallow Maneuvers: Seated upright 90  degrees    Other  Recommendations Oral Care Recommendations: Oral care Q4  per protocol;Oral care BID   Follow Up Recommendations  None    Frequency and Duration        Pertinent Vitals/Pain NA    SLP Swallow Goals     General HPI: 53 yo female with recent admission for CAP  re-presents with fever, leukocytosis, chest pain, and hypoxia in  setting of possible seizure vs pre-syncope. Intubated from 1/27  to 2/1. Type of Study: Modified Barium Swallowing Study Reason for Referral: Objectively evaluate swallowing function Previous Swallow Assessment: BSE 1/22 - suspected esopahgeal  dysphagia. Rec Dys 3/thin with esophageal precautions.  Diet Prior to this Study: Dysphagia 1 (puree) Temperature Spikes Noted: No Respiratory Status: Nasal cannula History of Recent Intubation: No Behavior/Cognition: Alert;Cooperative;Pleasant mood Oral Cavity - Dentition: Adequate natural dentition Self-Feeding Abilities: Able to feed self Patient Positioning: Upright in chair Baseline Vocal Quality: Clear Volitional Cough: Strong Volitional Swallow: Able to elicit     Reason for Referral Objectively evaluate swallowing function   Oral Phase Oral Preparation/Oral Phase Oral Phase: WFL   Pharyngeal Phase Pharyngeal Phase Pharyngeal Phase: Impaired Pharyngeal - Thin Pharyngeal - Thin Cup: Within functional limits Pharyngeal - Thin Straw: Penetration/Aspiration before  swallow;Trace aspiration;Delayed swallow initiation Penetration/Aspiration details (thin straw): Material enters  airway, CONTACTS cords and not ejected out (despite cough  response) Pharyngeal - Solids Pharyngeal - Puree: Within functional limits Pharyngeal - Regular: Within functional limits Pharyngeal - Pill: Delayed swallow  initiation;Penetration/Aspiration before swallow;Trace aspiration Penetration/Aspiration details (pill): Material enters airway,  passes BELOW cords and not ejected out despite cough attempt by  patient  Cervical Esophageal Phase    GO    Cervical Esophageal Phase Cervical Esophageal Phase: WFL (Esophageal sweep did not reveal  any abnormality. No radiolog)        Herbie Baltimore, MA CCC-SLP [phone]  [name], [name] Ponto 11/03/2013, 2:07 PM    ASSESSMENT / PLAN: Subacute pneumonitis, NOS Mildly elevated p-ANCA and myeloperoxidase ab, uncertain significance (ESR 95 mm/hr) Elevated pro-BNP  Recommendations: Recheck CXR today Cont slow pred taper Cont rest as is Possible DC home 2/19  Georgann Housekeeper, ACNP [place] Pager [phone] or [phone]  PCCM ATTENDING: I have interviewed and examined the patient and reviewed the database. I have formulated the assessment and plan as reflected in the note above with amendments made by me.   No distress on RA Chest exam clear Will recheck CXR today The exact dx of this pneumonitis remains unclear The autoimmune abs are minimally abnormal and of uncertain significance  Possible DC home on long term pred taper tomorrow  Merton Border, MD;  PCCM service; Mobile [phone]

## 2013-11-05 NOTE — Progress Notes (Signed)
SATURATION QUALIFICATIONS: (This note is used to comply with regulatory documentation for home oxygen)  Patient Saturations on Room Air at Rest = 99%  Patient Saturations on Room Air while Ambulating = 87%  Patient Saturations on 2 Liters of oxygen while Ambulating = 99%  Please briefly explain why patient needs home oxygen: Pt saturations returned to 98% on room air when returned to chair.

## 2013-11-05 NOTE — Progress Notes (Signed)
I confirmed/revised the medical student's history, exam, assessment and plan.   

## 2013-11-05 NOTE — Progress Notes (Signed)
Occupational Therapy Treatment Patient Details Name: Maxwell Caulieves Mccaster MRN: [id number] DOB: 06/18/1961 Today's Date: 11/05/2013 Time: [id number] OT Time Calculation (min): 36 min  OT Assessment / Plan / Recommendation  History of present illness Maxwell Caulieves Ivancic is a 53 y.o. [place] female with PMH fibromyalgia, chronic pain  with a recent admission for CAP complicated by septic shock requiring ICU-level care (1/27-2/10/15) presents to the ED with recurrent dyspnea   OT comments  Pt with concerns about being able to afford meds at home. Wants home 02 to prevent having to return to the hospital.  Pt did not require 02 this session, but needed rest breaks during ADL.  Overall performing at a supervision level.  Needs to practice shower transfers.  Follow Up Recommendations  Home health OT;Supervision/Assistance - 24 hour    Barriers to Discharge       Equipment Recommendations  None recommended by OT    Recommendations for Other Services    Frequency Min 2X/week   Progress towards OT Goals Progress towards OT goals: Progressing toward goals  Plan Discharge plan remains appropriate    Precautions / Restrictions Precautions Precautions: Fall Restrictions Weight Bearing Restrictions: No   Pertinent Vitals/Pain VSS, did not c/o pain but report chronic back pain    ADL  Grooming: Wash/dry hands;Teeth care;Supervision/safety Where Assessed - Grooming: Supported standing (leans on sink) Upper Body Bathing: Supervision/safety Where Assessed - Upper Body Bathing: Unsupported sitting Lower Body Bathing: Supervision/safety Where Assessed - Lower Body Bathing: Unsupported sitting;Unsupported standing Upper Body Dressing: Supervision/safety Where Assessed - Upper Body Dressing: Unsupported sitting Lower Body Dressing: Supervision/safety Where Assessed - Lower Body Dressing: Unsupported sitting;Unsupported standing Toilet Transfer: Radiographer, therapeuticupervision/safety Toilet Transfer Method: Sit to  [place] Toilet Transfer Equipment: Regular height toilet Toileting - Clothing Manipulation and Hygiene: Supervision/safety Where Assessed - Toileting Clothing Manipulation and Hygiene: Sit to stand from 3-in-1 or toilet Equipment Used: Gait belt Transfers/Ambulation Related to ADLs: supervision, pt walks slowly ADL Comments: Instructed pt to avoid bending to access feet.  Able to cross foot over opposite knee to donn and doff socks and to donn pajama pants.  Pt states there will be a chair she can use to shower to conserve energy at home when she gets there.      OT Diagnosis:    OT Problem List:   OT Treatment Interventions:     OT Goals(current goals can now be found in the care plan section) Acute Rehab OT Goals Patient Stated Goal: To get stronger  Visit Information  Last OT Received On: 11/05/13 Assistance Needed: +1 History of Present Illness: Maxwell Caulieves Trotta is a 53 y.o. [place] female with PMH fibromyalgia, chronic pain  with a recent admission for CAP complicated by septic shock requiring ICU-level care (1/27-2/10/15) presents to the ED with recurrent dyspnea    Subjective Data      Prior Functioning       Cognition  Cognition Arousal/Alertness: Awake/alert Behavior During Therapy: Flat affect Overall Cognitive Status: Within Functional Limits for tasks assessed Area of Impairment: Memory (per pt report) Memory: Decreased short-term memory    Mobility  Bed Mobility Overal bed mobility: Modified Independent Transfers Transfers: Sit to/from Stand Sit to Stand: Supervision    Exercises      Balance    End of Session OT - End of Session Equipment Utilized During Treatment: Gait belt Activity Tolerance: Patient limited by fatigue Patient left: in bed;with call bell/phone within reach  GO     Evern BioMayberry, Matilyn Fehrman Lynn 11/05/2013, 11:02  AM [phone]

## 2013-11-05 NOTE — Progress Notes (Signed)
Medical Student Daily Progress Note  Subjective: Patient feels better this morning, off O2 and denies SOB. Complains of pain "all over her body," and is concerned her fentynl patch is wearing off. Did not receive fentyl after prior discharge and is asking for [place] to prescribe it when she leaves. Was previously prescribed by pain clinic in [place] but she has not been back since September. Very nervous, feels like she is dying and something is "eating her from the inside." Does not want to leave the hospital without home oxygen.   Objective: Vital signs in last 24 hours: Filed Vitals:   11/04/13 1410 11/04/13 2059 11/05/13 0606 11/05/13 1500  BP: 96/55 114/68 119/68 95/67  Pulse: 61 57 52 76  Temp: 98.1 F (36.7 C) 98.4 F (36.9 C) 98.1 F (36.7 C) 98.6 F (37 C)  TempSrc: Oral Oral Oral Oral  Resp: '20 18 20 18  ' Height:      Weight:      SpO2: 68% 100% 100% 100%   Weight change:   Intake/Output Summary (Last 24 hours) at 11/05/13 1553 Last data filed at 11/05/13 1500  Gross per 24 hour  Intake    840 ml  Output      0 ml  Net    840 ml   Physical Exam: Constitutional: Alert, in no acute distress and cooperative with exam.  Eyes: PERRLA, conjunctivae normal, no scleral icterus  Neck: Supple, Trachea midline  Cardiovascular: RRR, S1, S2 present, no murmurs DP 2+ b/l,  Pulmonary/Chest: normal respiratory effort, CTAB, no wheezes, rales, or rhonchi  Abdominal: Soft, nondistended, +suprapubic tenderness, +BS Neurological: Alert and responsive and able to follow commands; cranial nerves II-XII are grossly intact, moving all extremities.  GU: bilateral labial lesions with open sores  Skin: Warm, dry and intact.  Extremities: 2+ pitting edema bilaterally  Psychiatric: Normal mood and affect.   Micro Results: Recent Results (from the past 240 hour(s))  CULTURE, BLOOD (ROUTINE X 2)     Status: None   Collection Time    11/02/13 12:35 AM      Result Value Ref Range Status   Specimen Description BLOOD RIGHT ANTECUBITAL   Final   Special Requests BOTTLES DRAWN AEROBIC ONLY 10CC   Final   Culture  Setup Time     Final   Value: 11/02/2013 14:36     Performed at [facility]   Culture     Final   Value:        BLOOD CULTURE RECEIVED NO GROWTH TO DATE CULTURE WILL BE HELD FOR 5 DAYS BEFORE ISSUING A FINAL NEGATIVE REPORT     Performed at [facility]   Report Status PENDING   Incomplete  CULTURE, BLOOD (ROUTINE X 2)     Status: None   Collection Time    11/02/13 12:40 AM      Result Value Ref Range Status   Specimen Description BLOOD ARM LEFT   Final   Special Requests BOTTLES DRAWN AEROBIC AND ANAEROBIC 5CC   Final   Culture  Setup Time     Final   Value: 11/02/2013 14:36     Performed at [facility]   Culture     Final   Value:        BLOOD CULTURE RECEIVED NO GROWTH TO DATE CULTURE WILL BE HELD FOR 5 DAYS BEFORE ISSUING A FINAL NEGATIVE REPORT     Performed at [facility]   Report Status PENDING  Incomplete  URINE CULTURE     Status: None   Collection Time    11/02/13  3:34 AM      Result Value Ref Range Status   Specimen Description URINE, CLEAN CATCH   Final   Special Requests NONE   Final   Culture  Setup Time     Final   Value: 11/02/2013 14:53     Performed at [facility]     Final   Value: NO GROWTH     Performed at [facility]   Culture     Final   Value: NO GROWTH     Performed at [facility]   Report Status 11/03/2013 FINAL   Final  MRSA PCR SCREENING     Status: None   Collection Time    11/02/13  5:44 AM      Result Value Ref Range Status   MRSA by PCR NEGATIVE  NEGATIVE Final   Comment:            The GeneXpert MRSA Assay (FDA     approved for NASAL specimens     only), is one component of a     comprehensive MRSA colonization     surveillance program. It is not     intended to diagnose MRSA     infection nor to guide or     monitor treatment for      MRSA infections.  RESPIRATORY VIRUS PANEL     Status: None   Collection Time    11/02/13  7:37 AM      Result Value Ref Range Status   Source - RVPAN NASAL SWAB   Corrected   Comment: CORRECTED ON 02/16 AT 1808: PREVIOUSLY REPORTED AS NASAL SWAB   Respiratory Syncytial Virus A NOT DETECTED   Final   Respiratory Syncytial Virus B NOT DETECTED   Final   Influenza A NOT DETECTED   Final   Influenza B NOT DETECTED   Final   Parainfluenza 1 NOT DETECTED   Final   Parainfluenza 2 NOT DETECTED   Final   Parainfluenza 3 NOT DETECTED   Final   Metapneumovirus NOT DETECTED   Final   Rhinovirus NOT DETECTED   Final   Adenovirus NOT DETECTED   Final   Influenza A H1 NOT DETECTED   Final   Influenza A H3 NOT DETECTED   Final   Comment: (NOTE)           Normal Reference Range for each Analyte: NOT DETECTED     Testing performed using the Luminex xTAG Respiratory Viral Panel test     kit.     This test was developed and its performance characteristics determined     by [facility]. It has not been cleared or approved by the [place]     Food and Drug Administration. This test is used for clinical purposes.     It should not be regarded as investigational or for research. This     laboratory is certified under the [place] (CLIA) as qualified to perform high complexity     clinical laboratory testing.     Performed at [facility]   Studies/Results: No results found. Medications: I have reviewed the patient's current medications. Scheduled Meds: . [START ON 11/06/2013] enoxaparin (LOVENOX) injection  40 mg Subcutaneous Q24H  . etodolac  500 mg Oral BID  . fentaNYL  50 mcg Transdermal Q72H  . folic acid  1 mg Oral Daily  . levETIRAcetam  500 mg Oral BID  . [START ON 11/06/2013] pantoprazole  40 mg Oral Daily  . potassium chloride SA  40 mEq Oral Daily  . predniSONE  30 mg Oral Q breakfast  . pregabalin  300 mg Oral BID  . vitamin B-12   2,000 mcg Oral Daily   Continuous Infusions:   PRN Meds:.SUMAtriptan, traMADol Assessment/Plan: Principal Problem:   Acute respiratory failure with hypoxia Active Problems:   Sepsis   Chronic pain   Seizure disorder   Fibromyalgia   Anemia of chronic disease   Severe sepsis(995.92)   HCAP (healthcare-associated pneumonia)   Connective tissue disease   ILD (interstitial lung disease)   Acute respiratory failure   Pneumonitis   LOS: 3 days   [name] [name] is a 53 y.o. [name] female with PMH fibromyalgia and ?seizure disorder with a recent admission for CAP complicated by septic shock requiring ICU-level care (1/27-2/10/15) who presented to the ED with recurrent dyspnea, improving.  #Acute respiratory failure - resolved. Off O2, off antibiotics. RVP negative, BC x2 NGTD. [place]/strep pneumo negative. ?Interstitial lung disease. Responded well to steroids.  -prednisone taper -consider PFTs to assess ?interstital lung disease -evaluate need for home health oxygen -titrate O2 to O2 sats > 92% -pulmonology critical care consulted, appreciate recs  #?Autoimmune Disease - p-ANCA positive; myeloperoxidase 1.2 (H); ESR 98 (but would expect this to be high in acute illness and is non-specific); ANA positive; c3/c4 negative; RF 13 (wnl); anti-DNA 1 (negative); CRP 23.8 (H); cryoglobulin negative; TSH/T4 low; ANA titer 1:80; p-ANCA titer 1:20 (nl=<1:20). CXR with evidence of hilar adenopathy, concerning for sarcoid.  Normal ACE level.  ?history of erythema nodsum. Family history of lupus in maternal grandmother. -consider lung biopsy as outpatient -prednisone taper  #Hyperglycemia: likely due to high dose steroids -continue current insulin regimen   #Fibromyalgia/ Chronic Pain. Patient with chronic back pain in addition to diffuse abdominal pain.  -continue lyrica 378m BID -continue tramadol 569mBID -continue fentanyl patch  #History of migraines -imitrex PRN   #Seizure  disorder - No seizures activity this admission  -continue keppra   #Deconditioning - PT recommends HH PT  -continue PT/OT   #Anemia: Patient has low folate and B12, also with low ferretin.  -continue folate and B12 supplementation   Prophylaxis: -heparin -protonix 4023mID  #Dispo- Disposition is deferred at this time, awaiting improvement of current medical problems. Anticipated discharge in approximately 1-2 day(s).   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  The care of the patient was discussed with Dr. GilGordy Levand the assessment and plan formulated with their assistance.  Please see their attached note for official documentation of the daily encounter.  WooCiro Backer18/2015, 3:53 PM

## 2013-11-06 ENCOUNTER — Telehealth: Payer: Self-pay | Admitting: Internal Medicine

## 2013-11-06 DIAGNOSIS — A0472 Enterocolitis due to Clostridium difficile, not specified as recurrent: Secondary | ICD-10-CM

## 2013-11-06 DIAGNOSIS — G43909 Migraine, unspecified, not intractable, without status migrainosus: Secondary | ICD-10-CM

## 2013-11-06 LAB — URINALYSIS, ROUTINE W REFLEX MICROSCOPIC
GLUCOSE, UA: NEGATIVE mg/dL
HGB URINE DIPSTICK: NEGATIVE
KETONES UR: NEGATIVE mg/dL
Leukocytes, UA: NEGATIVE
Nitrite: NEGATIVE
PH: 6 (ref 5.0–8.0)
PROTEIN: NEGATIVE mg/dL
Specific Gravity, Urine: 1.017 (ref 1.005–1.030)
Urobilinogen, UA: 0.2 mg/dL (ref 0.0–1.0)

## 2013-11-06 LAB — BASIC METABOLIC PANEL
BUN: 21 mg/dL (ref 6–23)
CHLORIDE: 108 meq/L (ref 96–112)
CO2: 28 mEq/L (ref 19–32)
Calcium: 8.2 mg/dL — ABNORMAL LOW (ref 8.4–10.5)
Creatinine, Ser: 0.71 mg/dL (ref 0.50–1.10)
Glucose, Bld: 81 mg/dL (ref 70–99)
POTASSIUM: 4.2 meq/L (ref 3.7–5.3)
Sodium: 143 mEq/L (ref 137–147)

## 2013-11-06 LAB — GLUCOSE, CAPILLARY
GLUCOSE-CAPILLARY: 73 mg/dL (ref 70–99)
Glucose-Capillary: 108 mg/dL — ABNORMAL HIGH (ref 70–99)

## 2013-11-06 LAB — CLOSTRIDIUM DIFFICILE BY PCR: CDIFFPCR: POSITIVE — AB

## 2013-11-06 MED ORDER — METRONIDAZOLE 500 MG PO TABS
500.0000 mg | ORAL_TABLET | Freq: Three times a day (TID) | ORAL | Status: AC
Start: 2013-11-06 — End: 2013-11-20

## 2013-11-06 MED ORDER — LEVETIRACETAM 500 MG PO TABS
500.0000 mg | ORAL_TABLET | Freq: Two times a day (BID) | ORAL | Status: DC
Start: 2013-11-06 — End: 2014-01-06

## 2013-11-06 MED ORDER — PREDNISONE 10 MG PO TABS: ORAL_TABLET | ORAL | Status: DC

## 2013-11-06 MED ORDER — POTASSIUM CHLORIDE CRYS ER 20 MEQ PO TBCR
20.0000 meq | EXTENDED_RELEASE_TABLET | Freq: Two times a day (BID) | ORAL | Status: DC
  Administered 2013-11-06: 20 meq via ORAL

## 2013-11-06 MED ORDER — PREGABALIN 300 MG PO CAPS: 300.0000 mg | ORAL_CAPSULE | Freq: Two times a day (BID) | ORAL | Status: DC

## 2013-11-06 MED ORDER — PANTOPRAZOLE SODIUM 40 MG PO TBEC
40.0000 mg | DELAYED_RELEASE_TABLET | Freq: Two times a day (BID) | ORAL | Status: DC
Start: 2013-11-06 — End: 2014-03-03

## 2013-11-06 MED ORDER — FENTANYL 50 MCG/HR TD PT72: 50.0000 ug | MEDICATED_PATCH | TRANSDERMAL | Status: DC

## 2013-11-06 NOTE — Care Management Note (Signed)
    Page 2 of 2   11/06/2013     4:58:32 PM   CARE MANAGEMENT NOTE 11/06/2013  Patient:  [name] [name],[name] [name]   Account Number:  [id number]  Date Initiated:  11/06/2013  Documentation initiated by:  Ashton Sabine  Subjective/Objective Assessment:   PT ADM ON 2/15 WITH SOB, RECENT PNA ADMISSION.  PTA, PT INDEPENDENT, LIVES WITH FAMILY.  SHE IS FROM [place].     Action/Plan:   PT IS UNINSURED AND HAS NO PCP.   Anticipated DC Date:  11/06/2013   Anticipated DC Plan:  [place]  CM consult  [facility]  Follow-up appt scheduled      Choice offered to / List presented to:     DME arranged  [facility]      DME agency  [facility].        Status of service:  Completed, signed off Medicare Important Message given?   (If response is "NO", the following Medicare IM given date fields will be blank) Date Medicare IM given:   Date Additional Medicare IM given:    Discharge Disposition:  HOME/SELF CARE  Per UR Regulation:  Reviewed for med. necessity/level of care/duration of stay  If discussed at [place] of Stay Meetings, dates discussed:    Comments:  11/06/13 Howell Groesbeck,RN,BSN [phone] PT [place].  SHE WILL NEED HOME OXYGEN, AS CONT TO DESAT WITH AMBULATION.  REFERRAL TO [facility] FOR HOME O2 SET UP AND PORTABLE O2 TANK.  MET WITH PT TO DISCUSS DC FOLLOW UP; APPT MADE AT [place] 2/23 AT 2:30PM.  HERE SHE WILL BE ABLE TO ESTABLISH PCP AND APPLY FOR ORANGE CARD TO ASSIST WITH MEDICATIONS.  SHE IS NOT ELIGIBLE FOR MATCH LETTER, AS IT APPEARS SHE WAS GIVEN ONE IN FEBRUARY OF THIS YEAR, THOUGH SHE STATES SHE DID NOT USE IT.  THE ONLY MEDICATION SHE COULD USE IT FOR ON THIS ADMISSION IS A $4 GENERIC--SHE STATES SHE WILL NOT NEED A LETTER THIS TIME; SHE WILL SAVE IT FOR MORE EXPENSIVE MED.

## 2013-11-06 NOTE — Progress Notes (Signed)
Will send stool sample to lab; ok to d/c pt prior to lab resulting; if positive, MD will notify pt at home; will cont. To monitor.

## 2013-11-06 NOTE — Progress Notes (Signed)
MD paged at this time regarding d/c home; pt inquiring about d/c home; pt ready for d/c home; brother in room; will await callback.

## 2013-11-06 NOTE — Progress Notes (Signed)
Pt given d/c instructions at this time; IV d/c; pt to d/c home with brother.

## 2013-11-06 NOTE — Progress Notes (Signed)
  I have seen and examined the patient, and reviewed the daily progress note by Kerby Noraiana Woodall, MS III and discussed the care of the patient with them. Please see my progress note from 11/06/2013 for further details regarding assessment and plan.    Signed:  Belia HemanNeema K Cain Fitzhenry, MD 11/06/2013, 10:26 AM

## 2013-11-06 NOTE — Progress Notes (Signed)
Subjective: Doing well this morning,but describes 2 loose, watery, nonbloody BMs this morning and yesterday as well.  Also describes burning and pain on urination.  Objective: Vital signs in last 24 hours: Filed Vitals:   11/05/13 0606 11/05/13 1500 11/05/13 2212 11/06/13 0434  BP: 119/68 95/67 111/58 112/69  Pulse: 52 76 58 57  Temp: 98.1 F (36.7 C) 98.6 F (37 C) 98.3 F (36.8 C) 98 F (36.7 C)  TempSrc: Oral Oral Oral Oral  Resp: '20 18 18 16  ' Height:      Weight:      SpO2: 100% 100% 100% 100%   Weight change:   Intake/Output Summary (Last 24 hours) at 11/06/13 1019 Last data filed at 11/06/13 0824  Gross per 24 hour  Intake    720 ml  Output      0 ml  Net    720 ml   General: resting in bed, no acute distress HEENT: PERRL, EOMI, no scleral icterus Cardiac: RRR, no rubs, murmurs or gallops Pulm: clear to auscultation bilaterally, moving normal volumes of air Abd: soft, nontender, nondistended, BS normoactive Ext: warm and well perfused, +nonpitting b/l LE edema Neuro: alert and oriented X3, cranial nerves II-XII grossly intact  Lab Results: Basic Metabolic Panel:  Recent Labs Lab 11/02/13 0838  11/04/13 0315 11/05/13 0745 11/06/13 0304  NA 145  < > 146 141 143  K 4.1  < > 3.4* 3.4* 4.2  CL 109  < > 104 102 108  CO2 23  < > '28 27 28  ' GLUCOSE 117*  < > 134* 82 81  BUN 9  < > '22 17 21  ' CREATININE 0.52  < > 0.73 0.64 0.71  CALCIUM 8.1*  < > 8.4 8.4 8.2*  MG 1.6  --  1.7  --   --   < > = values in this interval not displayed.  CBG:  Recent Labs Lab 11/04/13 1645 11/04/13 2056 11/05/13 0605 11/05/13 1117 11/05/13 1622 11/06/13 0658  GLUCAP 166* 110* 100* 124* 136* 73    Micro Results: Recent Results (from the past 240 hour(s))  CULTURE, BLOOD (ROUTINE X 2)     Status: None   Collection Time    11/02/13 12:35 AM      Result Value Ref Range Status   Specimen Description BLOOD RIGHT ANTECUBITAL   Final   Special Requests BOTTLES DRAWN  AEROBIC ONLY 10CC   Final   Culture  Setup Time     Final   Value: 11/02/2013 14:36     Performed at [facility]   Culture     Final   Value:        BLOOD CULTURE RECEIVED NO GROWTH TO DATE CULTURE WILL BE HELD FOR 5 DAYS BEFORE ISSUING A FINAL NEGATIVE REPORT     Performed at [facility]   Report Status PENDING   Incomplete  CULTURE, BLOOD (ROUTINE X 2)     Status: None   Collection Time    11/02/13 12:40 AM      Result Value Ref Range Status   Specimen Description BLOOD ARM LEFT   Final   Special Requests BOTTLES DRAWN AEROBIC AND ANAEROBIC 5CC   Final   Culture  Setup Time     Final   Value: 11/02/2013 14:36     Performed at [facility]   Culture     Final   Value:        BLOOD CULTURE RECEIVED NO  GROWTH TO DATE CULTURE WILL BE HELD FOR 5 DAYS BEFORE ISSUING A FINAL NEGATIVE REPORT     Performed at [facility]   Report Status PENDING   Incomplete  URINE CULTURE     Status: None   Collection Time    11/02/13  3:34 AM      Result Value Ref Range Status   Specimen Description URINE, CLEAN CATCH   Final   Special Requests NONE   Final   Culture  Setup Time     Final   Value: 11/02/2013 14:53     Performed at [facility]     Final   Value: NO GROWTH     Performed at [facility]   Culture     Final   Value: NO GROWTH     Performed at [facility]   Report Status 11/03/2013 FINAL   Final  MRSA PCR SCREENING     Status: None   Collection Time    11/02/13  5:44 AM      Result Value Ref Range Status   MRSA by PCR NEGATIVE  NEGATIVE Final   Comment:            The GeneXpert MRSA Assay (FDA     approved for NASAL specimens     only), is one component of a     comprehensive MRSA colonization     surveillance program. It is not     intended to diagnose MRSA     infection nor to guide or     monitor treatment for     MRSA infections.  RESPIRATORY VIRUS PANEL     Status: None   Collection Time     11/02/13  7:37 AM      Result Value Ref Range Status   Source - RVPAN NASAL SWAB   Corrected   Comment: CORRECTED ON 02/16 AT 1808: PREVIOUSLY REPORTED AS NASAL SWAB   Respiratory Syncytial Virus A NOT DETECTED   Final   Respiratory Syncytial Virus B NOT DETECTED   Final   Influenza A NOT DETECTED   Final   Influenza B NOT DETECTED   Final   Parainfluenza 1 NOT DETECTED   Final   Parainfluenza 2 NOT DETECTED   Final   Parainfluenza 3 NOT DETECTED   Final   Metapneumovirus NOT DETECTED   Final   Rhinovirus NOT DETECTED   Final   Adenovirus NOT DETECTED   Final   Influenza A H1 NOT DETECTED   Final   Influenza A H3 NOT DETECTED   Final   Comment: (NOTE)           Normal Reference Range for each Analyte: NOT DETECTED     Testing performed using the Luminex xTAG Respiratory Viral Panel test     kit.     This test was developed and its performance characteristics determined     by [facility]. It has not been cleared or approved by the [place]     Food and Drug Administration. This test is used for clinical purposes.     It should not be regarded as investigational or for research. This     laboratory is certified under the [place] (CLIA) as qualified to perform high complexity     clinical laboratory testing.     Performed at [facility]   Studies/Results: Dg Chest 2 View  11/05/2013   CLINICAL DATA:  Bilateral pneumonia.  EXAM: CHEST - 2 VIEW  COMPARISON:  DG CHEST 1V PORT dated 11/03/2013; CT ANGIO CHEST W/CM &/OR WO/CM dated 11/02/2013; DG CHEST 1V PORT dated 11/02/2013; DG CHEST 1V PORT dated 10/21/2013  FINDINGS: Significantly improved aeration bilaterally with essentially near complete resolution of bilateral infiltrates. No edema or pleural fluid is identified. The heart size and mediastinal contours are within normal limits. The visualized skeletal structures are unremarkable.  IMPRESSION: Near complete resolution of bilateral  pulmonary infiltrates.   Electronically Signed   By: Aletta Edouard M.D.   On: 11/05/2013 19:01   Medications: I have reviewed the patient's current medications. Scheduled Meds: . enoxaparin (LOVENOX) injection  40 mg Subcutaneous Q24H  . etodolac  500 mg Oral BID  . fentaNYL  50 mcg Transdermal Q72H  . folic acid  1 mg Oral Daily  . levETIRAcetam  500 mg Oral BID  . pantoprazole  40 mg Oral Daily  . potassium chloride SA  20 mEq Oral BID  . predniSONE  30 mg Oral Q breakfast  . pregabalin  300 mg Oral BID  . vitamin B-12  2,000 mcg Oral Daily   Continuous Infusions:  PRN Meds:.SUMAtriptan, traMADol  Assessment/Plan: #Acute respiratory failure - Resolved. No change since yesterday. Etiology remains unclear-most likely ILD. PCCM consulted and recommending prednisone taper and repeat CXR with close f/u with them. She will need to have a VATS biopsy after discharge.  -appreciate PCCM recs regarding appropriate prednisone taper -plan to d/c home today with O2 and prednisone taper   #Diarrhea: New onset x 2d, C.diff negative 10/22/13, also complains of dysuria, symptoms may be related to UTI -Given complicated history with Abx use and PPI use, check C.diff PCR and UA (UA on 11/02/13 clean)  #HCAP with sepsis - Resolved. Pt has been treated adequately for pneumonia in her previous 2 hospitalizations--this is her third for the same admission. Respiratory virus panel negative. BCx x2 NGTD. Legionella/strep pneumonia negative.  -monitor    #?Autoimmune disease - Last admit she was found to be ANA positive with titer of 1:80 and mildly p-ANCA positive with titer of 1:20. Myeloperoxidase was 1.2 (H); ESR 98, and CRP 23.8 RF, anti-DNA 1, cryoglobulin, ACE, c3/c4 were all within normal limits. TSH/T4 were low, likely due to her acute illness. Patient reported history of painful red bumps on her bilateral lower extremity 6 months ago but did not see a physician, although description is consistant with  erythema nodosum. 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 Vasculitis is less likely due to only mildly elevated p-ANCA, likely clinically insignificant. Outpatient rheum and pulm follow up had been planned but pt readmitted. PCCM has signed off and has made a f/u appt on 2/20. They recommend steroid taper.  - continue prednisone taper  - d/c home with home O2   #?Seizure disorder -  - Continue Keppra 500 mg po twice a day   #Fibromyalgia and Chronic Pain Syndrome - Stable.  -continue fentanyl patch, etodolac (+protonix), tramadol, lyrica.  #GERD - Continue protonix 40 mg twice a day   #DVT PPX - Heparin subcutaneous   #Dispo- Anticipated discharge today  -->REMAINDER PER DIANA WOODALL, MSIII NOTE  Dispo: Disposition is deferred at this time, awaiting improvement of current medical problems.  Anticipated discharge today.   The patient does not know have a current PCP and does not know need an [facility] hospital follow-up appointment after  discharge.  The patient does not have transportation limitations that hinder transportation to clinic appointments.  .Services Needed at time of discharge: Y = Yes, Blank = No PT:   OT:   RN:   Equipment:   Other:     LOS: 4 days   Othella Boyer, MD 11/06/2013, 10:19 AM

## 2013-11-06 NOTE — Progress Notes (Signed)
Discussed with Dr Everardo BealsSharda. OK for discharge today from pulmonary perspective Current CXR is entirely normal Would call this resolved (?) pulmonary process nonspecific pneumonitis Would taper prednisone as follows: 30 mg daily X 7 days 20 mg daily X 7 days 10 mg daily thereafter  She has follow up appointment 3/16 @ 10:30 with CXR to be performed @ that time  If at any time she relapses, recommend surgical lung bx for clarification of the process   PCCM will sign off. Please call if we can be of further assistance  Billy Fischeravid Simonds, MD ; [facility] Mobile [phone].  After 5:30 PM or weekends, call [phone]

## 2013-11-06 NOTE — Progress Notes (Addendum)
  Date: 11/06/2013  Patient name: [name] [name]  Medical record number: [id number]  Date of birth: 12/30/1960   This patient has been seen and the plan of care was discussed with the house staff. Please see their note for complete details. I concur with their findings with the following additions/corrections:  Will need prolonged taper per CCM over 6 weeks.  Follow up with CCM. Will need follow up with new PCP.  Patient has records from [name] for her pain medicine regimen and ok to give fentanyl patch until pcp appt to refill if appropriate or pain referral.  Ok to send on oxygen to use at night, patient understands she will need to pay.    Gardiner Barefootobert W Comer, MD 11/06/2013, 8:45 AM

## 2013-11-06 NOTE — Progress Notes (Signed)
Subjective:   Pt feels better this AM.   Objective:   Vital signs in last 24 hours: Filed Vitals:   11/05/13 0606 11/05/13 1500 11/05/13 2212 11/06/13 0434  BP: 119/68 95/67 111/58 112/69  Pulse: 52 76 58 57  Temp: 98.1 F (36.7 C) 98.6 F (37 C) 98.3 F (36.8 C) 98 F (36.7 C)  TempSrc: Oral Oral Oral Oral  Resp: _0 Height:      Weight:      SpO2: 100% 100% 100% 100%   Weight change:   Intake/Output Summary (Last 24 hours) at 11/06/13 0753 Last data filed at 11/05/13 2216  Gross per 24 hour  Intake    840 ml  Output      0 ml  Net    840 ml    Physical Exam: Constitutional: Vital signs reviewed.  Patient is in no acute distress and cooperative with exam.   Cardiovascular: RRR, no MRG Pulmonary/Chest: normal respiratory effort, CTAB Abdominal: Soft. +BS, Non-tender, non-distended Skin: Warm, dry and intact. No rash  Lab Results:  BMP:  Recent Labs Lab 11/02/13 0838  11/04/13 0315 11/05/13 0745 11/06/13 0304  NA 145  < > 146 141 143  K 4.1  < > 3.4* 3.4* 4.2  CL 109  < > 104 102 108  CO2 23  < > _1 GLUCOSE 117*  < > 134* 82 81  BUN 9  < > _2 CREATININE 0.52  < > 0.73 0.64 0.71  CALCIUM 8.1*  < > 8.4 8.4 8.2*  MG 1.6  --  1.7  --   --   < > = values in this interval not displayed.  CBC:  Recent Labs Lab 11/02/13 0035 11/03/13 0350 11/03/13 2210  WBC 32.7* 9.7 8.9  NEUTROABS 30.4*  --   --   HGB 10.8* 9.7* 9.2*  HCT 33.6* 30.9* 29.0*  MCV 89.1 89.3 87.6  PLT 310 223 231    Coagulation: No results found for this basename: LABPROT, INR,  in the last 168 hours  CBG:            Recent Labs Lab 11/04/13 1645 11/04/13 2056 11/05/13 0605 11/05/13 1117 11/05/13 1622 11/06/13 0658  GLUCAP 166* 110* 100* 124* 136* 73           HA1C:      No results found for this basename: HGBA1C,  in the last 168 hours  Lipid Panel: No results found for this basename: CHOL, HDL, LDLCALC, TRIG, CHOLHDL, LDLDIRECT,  in  the last 168 hours  LFTs:  Recent Labs Lab 11/02/13 0035  AST 25  ALT 31  ALKPHOS 143*  BILITOT 0.6  PROT 7.0  ALBUMIN 2.5*    Pancreatic Enzymes: No results found for this basename: LIPASE, AMYLASE,  in the last 168 hours  Ammonia: No results found for this basename: AMMONIA,  in the last 168 hours  Cardiac Enzymes:  Recent Labs Lab 11/03/13 0925 11/03/13 1620 11/03/13 2015  TROPONINI <0.30 <0.30 <0.30   Lab Results  Component Value Date   CKTOTAL 31 10/25/2013   TROPONINI <0.30 11/03/2013    EKG:  Date/Time:  Sunday November 02 2013 04:32:38 EST Ventricular Rate:  91 PR Interval:  122 QRS Duration: 73 QT Interval:  354 QTC Calculation: 435 R Axis:   82 Text Interpretation:  Sinus rhythm ED PHYSICIAN INTERPRETATION AVAILABLE IN [facility] Confirmed by TEST, RECORD ([id number]) on 11/04/2013  7:05:54 AM   BNP:  Recent Labs Lab 11/02/13 1450  PROBNP 3533.0*    D-Dimer: No results found for this basename: DDIMER,  in the last 168 hours  Urinalysis:  Recent Labs Lab 11/02/13 0334  COLORURINE YELLOW  LABSPEC 1.020  PHURINE 6.0  GLUCOSEU NEGATIVE  HGBUR NEGATIVE  BILIRUBINUR MODERATE*  KETONESUR NEGATIVE  PROTEINUR NEGATIVE  UROBILINOGEN 0.2  NITRITE NEGATIVE  LEUKOCYTESUR NEGATIVE    Micro Results: Recent Results (from the past 240 hour(s))  CULTURE, BLOOD (ROUTINE X 2)     Status: None   Collection Time    11/02/13 12:35 AM      Result Value Ref Range Status   Specimen Description BLOOD RIGHT ANTECUBITAL   Final   Special Requests BOTTLES DRAWN AEROBIC ONLY 10CC   Final   Culture  Setup Time     Final   Value: 11/02/2013 14:36     Performed at [facility]   Culture     Final   Value:        BLOOD CULTURE RECEIVED NO GROWTH TO DATE CULTURE WILL BE HELD FOR 5 DAYS BEFORE ISSUING A FINAL NEGATIVE REPORT     Performed at [facility]   Report Status PENDING   Incomplete  CULTURE, BLOOD (ROUTINE X 2)     Status: None    Collection Time    11/02/13 12:40 AM      Result Value Ref Range Status   Specimen Description BLOOD ARM LEFT   Final   Special Requests BOTTLES DRAWN AEROBIC AND ANAEROBIC 5CC   Final   Culture  Setup Time     Final   Value: 11/02/2013 14:36     Performed at [facility]   Culture     Final   Value:        BLOOD CULTURE RECEIVED NO GROWTH TO DATE CULTURE WILL BE HELD FOR 5 DAYS BEFORE ISSUING A FINAL NEGATIVE REPORT     Performed at [facility]   Report Status PENDING   Incomplete  URINE CULTURE     Status: None   Collection Time    11/02/13  3:34 AM      Result Value Ref Range Status   Specimen Description URINE, CLEAN CATCH   Final   Special Requests NONE   Final   Culture  Setup Time     Final   Value: 11/02/2013 14:53     Performed at [facility]     Final   Value: NO GROWTH     Performed at [facility]   Culture     Final   Value: NO GROWTH     Performed at [facility]   Report Status 11/03/2013 FINAL   Final  MRSA PCR SCREENING     Status: None   Collection Time    11/02/13  5:44 AM      Result Value Ref Range Status   MRSA by PCR NEGATIVE  NEGATIVE Final   Comment:            The GeneXpert MRSA Assay (FDA     approved for NASAL specimens     only), is one component of a     comprehensive MRSA colonization     surveillance program. It is not     intended to diagnose MRSA     infection nor to guide or     monitor treatment for  MRSA infections.  RESPIRATORY VIRUS PANEL     Status: None   Collection Time    11/02/13  7:37 AM      Result Value Ref Range Status   Source - RVPAN NASAL SWAB   Corrected   Comment: CORRECTED ON 02/16 AT 1808: PREVIOUSLY REPORTED AS NASAL SWAB   Respiratory Syncytial Virus A NOT DETECTED   Final   Respiratory Syncytial Virus B NOT DETECTED   Final   Influenza A NOT DETECTED   Final   Influenza B NOT DETECTED   Final   Parainfluenza 1 NOT DETECTED   Final   Parainfluenza  2 NOT DETECTED   Final   Parainfluenza 3 NOT DETECTED   Final   Metapneumovirus NOT DETECTED   Final   Rhinovirus NOT DETECTED   Final   Adenovirus NOT DETECTED   Final   Influenza A H1 NOT DETECTED   Final   Influenza A H3 NOT DETECTED   Final   Comment: (NOTE)           Normal Reference Range for each Analyte: NOT DETECTED     Testing performed using the Luminex xTAG Respiratory Viral Panel test     kit.     This test was developed and its performance characteristics determined     by [facility]. It has not been cleared or approved by the [place]     Food and Drug Administration. This test is used for clinical purposes.     It should not be regarded as investigational or for research. This     laboratory is certified under the [place] (CLIA) as qualified to perform high complexity     clinical laboratory testing.     Performed at [facility]    Blood Culture:    Component Value Date/Time   SDES URINE, RANDOM 11/02/2013 0552   SPECREQUEST NONE 11/02/2013 0552   CULT  Value: NO GROWTH Performed at [facility] 11/02/2013 0334   REPTSTATUS 11/03/2013 FINAL 11/02/2013 0552    Studies/Results: Dg Chest 2 View  11/05/2013   CLINICAL DATA:  Bilateral pneumonia.  EXAM: CHEST - 2 VIEW  COMPARISON:  DG CHEST 1V PORT dated 11/03/2013; CT ANGIO CHEST W/CM &/OR WO/CM dated 11/02/2013; DG CHEST 1V PORT dated 11/02/2013; DG CHEST 1V PORT dated 10/21/2013  FINDINGS: Significantly improved aeration bilaterally with essentially near complete resolution of bilateral infiltrates. No edema or pleural fluid is identified. The heart size and mediastinal contours are within normal limits. The visualized skeletal structures are unremarkable.  IMPRESSION: Near complete resolution of bilateral pulmonary infiltrates.   Electronically Signed   By: Aletta Edouard M.D.   On: 11/05/2013 19:01    Medications:  Scheduled Meds: . enoxaparin (LOVENOX)  injection  40 mg Subcutaneous Q24H  . etodolac  500 mg Oral BID  . fentaNYL  50 mcg Transdermal Q72H  . folic acid  1 mg Oral Daily  . levETIRAcetam  500 mg Oral BID  . pantoprazole  40 mg Oral Daily  . potassium chloride SA  40 mEq Oral BID  . predniSONE  30 mg Oral Q breakfast  . pregabalin  300 mg Oral BID  . vitamin B-12  2,000 mcg Oral Daily   Continuous Infusions:  PRN Meds:.SUMAtriptan, traMADol  Antibiotics: Anti-infectives   Start     Dose/Rate Route Frequency Ordered Stop   11/02/13 1730  levofloxacin (LEVAQUIN) IVPB 750 mg  Status:  Discontinued     750 mg 100 mL/hr over 90 Minutes Intravenous Every 24 hours 11/02/13 1625 11/03/13 1013   11/02/13 1200  vancomycin (VANCOCIN) IVPB 750 mg/150 ml premix  Status:  Discontinued     750 mg 150 mL/hr over 60 Minutes Intravenous Every 12 hours 11/02/13 0602 11/03/13 1013   11/02/13 1000  valACYclovir (VALTREX) tablet 1,000 mg  Status:  Discontinued     1,000 mg Oral 2 times daily 11/02/13 0324 11/03/13 1013   11/02/13 0600  ceFEPIme (MAXIPIME) 1 g in dextrose 5 % 50 mL IVPB  Status:  Discontinued     1 g 100 mL/hr over 30 Minutes Intravenous 3 times per day 11/02/13 0537 11/03/13 1013   11/02/13 0045  ceFEPIme (MAXIPIME) 2 g in dextrose 5 % 50 mL IVPB     2 g 100 mL/hr over 30 Minutes Intravenous  Once 11/02/13 0035 11/02/13 0123   11/02/13 0045  vancomycin (VANCOCIN) IVPB 1000 mg/200 mL premix     1,000 mg 200 mL/hr over 60 Minutes Intravenous  Once 11/02/13 0035 11/02/13 0211     Antibiotics Given (last 72 hours)   None      Day of Hospitalization:  4 Consults:    Assessment/Plan:   Principal Problem:   Acute respiratory failure with hypoxia Active Problems:   Sepsis   Chronic pain   Seizure disorder   Fibromyalgia   Anemia of chronic disease   Severe sepsis(995.92)   HCAP (healthcare-associated pneumonia)   Connective tissue disease   ILD (interstitial lung disease)   Acute respiratory failure    Pneumonitis  #Acute respiratory failure - Resolved.  Etiology unclear-most likely ILD.  PCCM consulted and recommending prednisone taper and repeat CXR with close f/u with them.  She will need to have a VATS biopsy after discharge.  -appreciate PCCM recs -consider d/c home with O2 and prednisone taper  #HCAP with sepsis - Resolved. Pt has been treated adequately for pneumonia in her previous 2 hospitalizations--this is her third for the same admission.  Respiratory virus panel negative.  BCx x2 NGTD. Legionella/strep pneumonia negative.  -monitor   #Pleuritic chest pain radiating to back - Resolved. No history of CAD. Troponins x 3 negative.    #?Autoimmune disease - Last admit she was found to be ANA positive with titer of 1:80 and mildly p-ANCA positive with titer of 1:20. Myeloperoxidase was 1.2 (H); ESR 98, and CRP 23.8 RF, anti-DNA 1, cryoglobulin, ACE, c3/c4 were all within normal limits. TSH/T4 were low, likely due to her acute illness. 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 Vasculitis is less likely due to only mildly elevated p-ANCA, likely clinically insignificant. Outpatient rheum and pulm follow up had been planned but pt readmitted.  PCCM has signed off and has made a f/u appt on 2/20.  They recommend steroid taper.  - continue prednisone taper - d/c home with home O2  #?Seizure disorder -  - Continue Keppra 500 mg po twice a day   #Fibromyalgia and Chronic Pain Syndrome - Stable.  -continue fentanyl patch, etodolac (+protonix), tramadol, lyrica.   #Hypokalemia -  -continue to monitor and replete as necessary  #Suprapubic pain - Resolved.  Urine culture 10/26/2013 showed no growth.  2/15 urine culture shows NG.   #Gential herpes -  Resolved. Treated during last hospitalization.  HSV 1 & 2 IgG were positive last admit. Denies recent flare.   #Normocytic Anemia - Last admit patient was found to have Vit B12 and folate deficiencies in addition to high ferretin indicating anemia of chronic disease. She was provided B12 and folate supplementation. Hemoglobin today is 10.8.  - Continue folate 1 mg daily  - Continue B12 supplements 2000 mcg daily   #Vitamin D deficiency - -Continue vitamin D 50,000 units every 7 days, last dose was 11/01/13   #GERD - Continue protonix 40 mg twice a day   #DVT PPX - Heparin subcutaneous  #Dispo- Anticipated discharge today/tomorrow.     LOS: 4 days   Michail Jewels, MD PGY-1, Internal Medicine  [phone] (7AM-5PM Mon-Fri)

## 2013-11-06 NOTE — Telephone Encounter (Signed)
Called patient because C.diff PCR was positive.  Patient had already been discharged.  She understands that she will take metronidazole 500mg  TID x 10 days.  [facility] [place].

## 2013-11-06 NOTE — Progress Notes (Signed)
MD paged at this time regarding order for stool for c-diff; pt had BM and is questioning whether or not sample needs to be sent to lab; will await callback.

## 2013-11-06 NOTE — Progress Notes (Signed)
Medical Student Daily Progress Note  Subjective: Patient still complains of pain all over, but denies SOB.   Objective: Vital signs in last 24 hours: Filed Vitals:   11/05/13 0606 11/05/13 1500 11/05/13 2212 11/06/13 0434  BP: 119/68 95/67 111/58 112/69  Pulse: 52 76 58 57  Temp: 98.1 F (36.7 C) 98.6 F (37 C) 98.3 F (36.8 C) 98 F (36.7 C)  TempSrc: Oral Oral Oral Oral  Resp: _0 Height:      Weight:      SpO2: 100% 100% 100% 100%   Weight change:   Intake/Output Summary (Last 24 hours) at 11/06/13 6073 Last data filed at 11/06/13 7106  Gross per 24 hour  Intake    720 ml  Output      0 ml  Net    720 ml   Physical Exam: Constitutional: Alert, in no acute distress and cooperative with exam.  Eyes: PERRLA, conjunctivae normal, no scleral icterus  Neck: Supple, Trachea midline  Cardiovascular: RRR, S1, S2 present, no murmurs DP 2+ b/l,  Pulmonary/Chest: normal respiratory effort, CTAB, no wheezes, rales, or rhonchi  Abdominal: Soft, nondistended, +suprapubic tenderness, +BS Neurological: Alert and responsive and able to follow commands; cranial nerves II-XII are grossly intact, moving all extremities.  Skin: Warm, dry and intact.  Extremities: 2+ pitting edema bilaterally  Psychiatric: Normal mood and affect.   Micro Results: Recent Results (from the past 240 hour(s))  CULTURE, BLOOD (ROUTINE X 2)     Status: None   Collection Time    11/02/13 12:35 AM      Result Value Ref Range Status   Specimen Description BLOOD RIGHT ANTECUBITAL   Final   Special Requests BOTTLES DRAWN AEROBIC ONLY 10CC   Final   Culture  Setup Time     Final   Value: 11/02/2013 14:36     Performed at [facility]   Culture     Final   Value:        BLOOD CULTURE RECEIVED NO GROWTH TO DATE CULTURE WILL BE HELD FOR 5 DAYS BEFORE ISSUING A FINAL NEGATIVE REPORT     Performed at [facility]   Report Status PENDING   Incomplete  CULTURE, BLOOD (ROUTINE X 2)      Status: None   Collection Time    11/02/13 12:40 AM      Result Value Ref Range Status   Specimen Description BLOOD ARM LEFT   Final   Special Requests BOTTLES DRAWN AEROBIC AND ANAEROBIC 5CC   Final   Culture  Setup Time     Final   Value: 11/02/2013 14:36     Performed at [facility]   Culture     Final   Value:        BLOOD CULTURE RECEIVED NO GROWTH TO DATE CULTURE WILL BE HELD FOR 5 DAYS BEFORE ISSUING A FINAL NEGATIVE REPORT     Performed at [facility]   Report Status PENDING   Incomplete  URINE CULTURE     Status: None   Collection Time    11/02/13  3:34 AM      Result Value Ref Range Status   Specimen Description URINE, CLEAN CATCH   Final   Special Requests NONE   Final   Culture  Setup Time     Final   Value: 11/02/2013 14:53     Performed at [facility]  Final   Value: NO GROWTH     Performed at [facility]   Culture     Final   Value: NO GROWTH     Performed at [facility]   Report Status 11/03/2013 FINAL   Final  MRSA PCR SCREENING     Status: None   Collection Time    11/02/13  5:44 AM      Result Value Ref Range Status   MRSA by PCR NEGATIVE  NEGATIVE Final   Comment:            The GeneXpert MRSA Assay (FDA     approved for NASAL specimens     only), is one component of a     comprehensive MRSA colonization     surveillance program. It is not     intended to diagnose MRSA     infection nor to guide or     monitor treatment for     MRSA infections.  RESPIRATORY VIRUS PANEL     Status: None   Collection Time    11/02/13  7:37 AM      Result Value Ref Range Status   Source - RVPAN NASAL SWAB   Corrected   Comment: CORRECTED ON 02/16 AT 1808: PREVIOUSLY REPORTED AS NASAL SWAB   Respiratory Syncytial Virus A NOT DETECTED   Final   Respiratory Syncytial Virus B NOT DETECTED   Final   Influenza A NOT DETECTED   Final   Influenza B NOT DETECTED   Final   Parainfluenza 1 NOT DETECTED   Final    Parainfluenza 2 NOT DETECTED   Final   Parainfluenza 3 NOT DETECTED   Final   Metapneumovirus NOT DETECTED   Final   Rhinovirus NOT DETECTED   Final   Adenovirus NOT DETECTED   Final   Influenza A H1 NOT DETECTED   Final   Influenza A H3 NOT DETECTED   Final   Comment: (NOTE)           Normal Reference Range for each Analyte: NOT DETECTED     Testing performed using the Luminex xTAG Respiratory Viral Panel test     kit.     This test was developed and its performance characteristics determined     by [facility]. It has not been cleared or approved by the [place]     Food and Drug Administration. This test is used for clinical purposes.     It should not be regarded as investigational or for research. This     laboratory is certified under the [place] (CLIA) as qualified to perform high complexity     clinical laboratory testing.     Performed at [facility]   Studies/Results: Dg Chest 2 View  11/05/2013   CLINICAL DATA:  Bilateral pneumonia.  EXAM: CHEST - 2 VIEW  COMPARISON:  DG CHEST 1V PORT dated 11/03/2013; CT ANGIO CHEST W/CM &/OR WO/CM dated 11/02/2013; DG CHEST 1V PORT dated 11/02/2013; DG CHEST 1V PORT dated 10/21/2013  FINDINGS: Significantly improved aeration bilaterally with essentially near complete resolution of bilateral infiltrates. No edema or pleural fluid is identified. The heart size and mediastinal contours are within normal limits. The visualized skeletal structures are unremarkable.  IMPRESSION: Near complete resolution of bilateral pulmonary infiltrates.   Electronically Signed   By: Aletta Edouard M.D.   On: 11/05/2013 19:01   Medications: I have reviewed the patient's  current medications. Scheduled Meds: . enoxaparin (LOVENOX) injection  40 mg Subcutaneous Q24H  . etodolac  500 mg Oral BID  . fentaNYL  50 mcg Transdermal Q72H  . folic acid  1 mg Oral Daily  . levETIRAcetam  500 mg Oral BID  .  pantoprazole  40 mg Oral Daily  . potassium chloride SA  20 mEq Oral BID  . predniSONE  30 mg Oral Q breakfast  . pregabalin  300 mg Oral BID  . vitamin B-12  2,000 mcg Oral Daily   Continuous Infusions:   PRN Meds:.SUMAtriptan, traMADol Assessment/Plan: Principal Problem:   Acute respiratory failure with hypoxia Active Problems:   Sepsis   Chronic pain   Seizure disorder   Fibromyalgia   Anemia of chronic disease   Severe sepsis(995.92)   HCAP (healthcare-associated pneumonia)   Connective tissue disease   ILD (interstitial lung disease)   Acute respiratory failure   Pneumonitis   LOS: 4 days   [name] [name] is a 53 y.o. [name] female with PMH fibromyalgia and ?seizure disorder with a recent admission for CAP complicated by septic shock requiring ICU-level care (1/27-2/10/15) who presented to the ED with recurrent dyspnea, now resolved,  #Acute respiratory failure - resolved. Etiology unclear-most likely ILD. PCCM consulted and recommending prednisone taper with close f/u with them. CXR shows almost complete improvement of bilateral infiltrates. She will need to have a VATS biopsy after discharge. -prednisone taper -discharge on home O2 for nighttime use  -f/u with PCCM  #?Autoimmune Disease - p-ANCA positive; myeloperoxidase 1.2 (H); ESR 98 (but would expect this to be high in acute illness and is non-specific); ANA positive; c3/c4 negative; RF 13 (wnl); anti-DNA 1 (negative); CRP 23.8 (H); cryoglobulin negative; TSH/T4 low; ANA titer 1:80; p-ANCA titer 1:20 (nl=<1:20). CXR with evidence of hilar adenopathy, concerning for sarcoid.  Normal ACE level.  ?history of erythema nodsum. Family history of lupus in maternal grandmother. -prednisone taper  #Hyperglycemia: likely due to high dose steroids -continue current insulin regimen   #Fibromyalgia/ Chronic Pain. Patient with chronic back pain in addition to diffuse abdominal pain.  -continue lyrica 348m BID -continue  tramadol 593mBID -continue fentanyl patch  #History of migraines -imitrex PRN   #Seizure disorder - No seizures activity this admission  -continue keppra   #Deconditioning - PT recommends HH PT  -continue PT/OT   #Anemia: Patient has low folate and B12, also with low ferretin.  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  The care of the patient was discussed with Dr. GilGordy Levand the assessment and plan formulated with their assistance.  Please see their attached note for official documentation of the daily encounter.  WooJake Bathe2/19/2015, 8:52 AM

## 2013-11-07 ENCOUNTER — Inpatient Hospital Stay: Payer: Self-pay | Admitting: Internal Medicine

## 2013-11-08 LAB — CULTURE, BLOOD (ROUTINE X 2)
Culture: NO GROWTH
Culture: NO GROWTH

## 2013-11-10 ENCOUNTER — Encounter: Payer: Self-pay | Admitting: Internal Medicine

## 2013-11-10 ENCOUNTER — Ambulatory Visit: Payer: Medicaid Other | Attending: Internal Medicine | Admitting: Internal Medicine

## 2013-11-10 VITALS — BP 119/83 | HR 107 | Temp 98.7°F | Resp 13 | Ht 61.0 in | Wt 124.2 lb

## 2013-11-10 DIAGNOSIS — A0472 Enterocolitis due to Clostridium difficile, not specified as recurrent: Secondary | ICD-10-CM

## 2013-11-10 DIAGNOSIS — Z09 Encounter for follow-up examination after completed treatment for conditions other than malignant neoplasm: Secondary | ICD-10-CM | POA: Insufficient documentation

## 2013-11-10 DIAGNOSIS — R599 Enlarged lymph nodes, unspecified: Secondary | ICD-10-CM | POA: Insufficient documentation

## 2013-11-10 DIAGNOSIS — R42 Dizziness and giddiness: Secondary | ICD-10-CM | POA: Insufficient documentation

## 2013-11-10 DIAGNOSIS — R262 Difficulty in walking, not elsewhere classified: Secondary | ICD-10-CM | POA: Insufficient documentation

## 2013-11-10 DIAGNOSIS — G40802 Other epilepsy, not intractable, without status epilepticus: Secondary | ICD-10-CM | POA: Insufficient documentation

## 2013-11-10 DIAGNOSIS — R5381 Other malaise: Secondary | ICD-10-CM | POA: Insufficient documentation

## 2013-11-10 DIAGNOSIS — G8929 Other chronic pain: Secondary | ICD-10-CM | POA: Insufficient documentation

## 2013-11-10 DIAGNOSIS — R5383 Other fatigue: Secondary | ICD-10-CM

## 2013-11-10 DIAGNOSIS — J189 Pneumonia, unspecified organism: Secondary | ICD-10-CM | POA: Insufficient documentation

## 2013-11-10 DIAGNOSIS — D638 Anemia in other chronic diseases classified elsewhere: Secondary | ICD-10-CM | POA: Insufficient documentation

## 2013-11-10 DIAGNOSIS — IMO0001 Reserved for inherently not codable concepts without codable children: Secondary | ICD-10-CM | POA: Insufficient documentation

## 2013-11-10 DIAGNOSIS — J8410 Pulmonary fibrosis, unspecified: Secondary | ICD-10-CM | POA: Insufficient documentation

## 2013-11-10 LAB — CBC WITH DIFFERENTIAL/PLATELET
Basophils Absolute: 0 10*3/uL (ref 0.0–0.1)
Basophils Relative: 0 % (ref 0–1)
EOS PCT: 0 % (ref 0–5)
Eosinophils Absolute: 0 10*3/uL (ref 0.0–0.7)
HEMATOCRIT: 32.7 % — AB (ref 36.0–46.0)
Hemoglobin: 10.7 g/dL — ABNORMAL LOW (ref 12.0–15.0)
LYMPHS ABS: 0.4 10*3/uL — AB (ref 0.7–4.0)
Lymphocytes Relative: 2 % — ABNORMAL LOW (ref 12–46)
MCH: 27.4 pg (ref 26.0–34.0)
MCHC: 32.7 g/dL (ref 30.0–36.0)
MCV: 83.8 fL (ref 78.0–100.0)
Monocytes Absolute: 0.4 10*3/uL (ref 0.1–1.0)
Monocytes Relative: 2 % — ABNORMAL LOW (ref 3–12)
NEUTROS ABS: 20.3 10*3/uL — AB (ref 1.7–7.7)
Neutrophils Relative %: 96 % — ABNORMAL HIGH (ref 43–77)
Platelets: 257 10*3/uL (ref 150–400)
RBC: 3.9 MIL/uL (ref 3.87–5.11)
RDW: 18.7 % — ABNORMAL HIGH (ref 11.5–15.5)
WBC: 21.1 10*3/uL — AB (ref 4.0–10.5)

## 2013-11-10 LAB — COMPLETE METABOLIC PANEL WITH GFR
ALT: 23 U/L (ref 0–35)
AST: 8 U/L (ref 0–37)
Albumin: 3 g/dL — ABNORMAL LOW (ref 3.5–5.2)
Alkaline Phosphatase: 94 U/L (ref 39–117)
BUN: 9 mg/dL (ref 6–23)
CALCIUM: 8.4 mg/dL (ref 8.4–10.5)
CO2: 30 meq/L (ref 19–32)
CREATININE: 0.61 mg/dL (ref 0.50–1.10)
Chloride: 103 mEq/L (ref 96–112)
GFR, Est African American: >89 mL/min
GFR, Est Non African American: >89 mL/min
Glucose, Bld: 100 mg/dL — ABNORMAL HIGH (ref 70–99)
POTASSIUM: 3.9 meq/L (ref 3.5–5.3)
Sodium: 142 mEq/L (ref 135–145)
TOTAL PROTEIN: 5.7 g/dL — AB (ref 6.0–8.3)
Total Bilirubin: 0.5 mg/dL (ref 0.2–1.2)

## 2013-11-10 MED ORDER — TRAMADOL HCL 50 MG PO TABS: 50.0000 mg | ORAL_TABLET | Freq: Two times a day (BID) | ORAL | Status: DC | PRN

## 2013-11-10 MED ORDER — SACCHAROMYCES BOULARDII 250 MG PO CAPS: 250.0000 mg | ORAL_CAPSULE | Freq: Two times a day (BID) | ORAL | Status: DC

## 2013-11-10 NOTE — Progress Notes (Signed)
Patient is here for a hospital follow up. Patient is a new patient. Patient uses an oxygen tank daily. Ox today when patient first came in was 8491 and now is 6994. Complains of SOB, pain in bilateral lungs and Rt lower back pain x1 month. Pain scale of 10 today. No OTC medication. Patient has severe diarrhea.

## 2013-11-10 NOTE — Discharge Summary (Signed)
Agree with note above. 

## 2013-11-10 NOTE — Progress Notes (Signed)
Patient ID: [name] [name], female   DOB: 07/26/1961, 53 y.o.   MRN: [id number]   CC:  HPI: [name] [name] is a 53 y.o. [place] female with PMH fibromyalgia and ?seizure disorder with a recent admission for CAP complicated by septic shock requiring ICU-level care (1/27-2/10/15) who presented to the ED with recurrent dyspnea. On presentation, she was noted to be tachycardic and tachypneic with a fever of 100 and PaO2 in the 50's on ABG. Of note, she had not been taking her prescribed prednisone that she was discharged with on 2/10  CT angiogram which was negative for PE. CXR showed increased left lower lobe patchy airspace opacity and chest CT showed somewhat improved mediastinal and bilateral adenopathy. Pulmonology critical care medicine was consulted and she was given pulse dose steroids (solumedrol 250mg  q6) for 2 days followed by a prednisone taper to be continued over 6 weeks with starting dose 30mg  daily.O2 sats continued to drop into the upper 80's and she was discharged on home oxygen for PRN use. Repeat CXR the day prior to admission showed near complete resolution of her bilateral pulmonary infiltrates. At this time, the etiology of her recurrent episodes of respiratory distress is still unknown - differential includes interstitial lung disease, sarcoidosis, pulmonary hypertension. She should follow up with pulmonology for repeat CT in 4-6 wks to evaluate mediastinal lymphadenopathy and consider lung biopsy. She has follow up appointment 3/16 @ 10:30 with CXR to be performed @ that time. Pulmonology recommend surgical lung bx for clarification of the process   Patient prescribed tramadol, pregabalin, and Fentanyl patch from [name], she has not been back since [place]. She was continued on her home medications as well as etodolac while in the hospital. She requested a new prescription for fentynl patch, which was provided at discharge on  2/19.  Patient subsequently developed C. difficile on the last day of her discharge. Continues to have a bowel movement every time she tries to urinate. She was started on Flagyl 3 times a day and 2/19. Patient has become very weak and deconditioned because of the diarrhea. She occasionally has dizziness and difficulty ambulating.     Allergies  Allergen Reactions  . Shellfish Allergy Anaphylaxis   Past Medical History  Diagnosis Date  . Fibromyalgia   . Chronic pain   . Seizures   . Migraine   . Pneumonia    Current Outpatient Prescriptions on File Prior to Visit  Medication Sig Dispense Refill  . Cyanocobalamin (B-12) 2000 MCG TABS Take 2,000 mcg by mouth daily.  30 tablet  12  . etodolac (LODINE) 500 MG tablet Take 500 mg by mouth 2 (two) times daily.       . fentaNYL (DURAGESIC - DOSED MCG/HR) 50 MCG/HR Place 1 patch (50 mcg total) onto the skin every 3 (three) days.  10 patch  0  . folic acid (FOLVITE) 1 MG tablet Take 1 tablet daily.  30 tablet  0  . levETIRAcetam (KEPPRA) 500 MG tablet Take 1 tablet (500 mg total) by mouth 2 (two) times daily.  60 tablet  2  . metroNIDAZOLE (FLAGYL) 500 MG tablet Take 1 tablet (500 mg total) by mouth 3 (three) times daily.  30 tablet  0  . pantoprazole (PROTONIX) 40 MG tablet Take 1 tablet (40 mg total) by mouth 2 (two) times daily.  30 tablet  0  . polyethylene glycol (MIRALAX / GLYCOLAX) packet Take 17 g by mouth daily  as needed for mild constipation or moderate constipation.  14 each  0  . potassium chloride (K-DUR) 10 MEQ tablet Take 1 tablet (10 mEq total) by mouth daily.  30 tablet  0  . predniSONE (DELTASONE) 10 MG tablet 3 tabs po daily x 7 days then 2 tabs po daily x 7 days then 1 tab po daily x 7 days then STOP  42 tablet  0  . pregabalin (LYRICA) 300 MG capsule Take 1 capsule (300 mg total) by mouth 2 (two) times daily.  60 capsule  0  . rizatriptan (MAXALT) 10 MG tablet Take 10 mg by mouth daily as needed for migraine. May repeat in  2 hours if needed      . Vitamin D, Ergocalciferol, (DRISDOL) 50000 UNITS CAPS capsule Take 50,000 Units by mouth every 7 (seven) days. Take on Saturday       No current facility-administered medications on file prior to visit.   History reviewed. No pertinent family history. History   Social History  . Marital Status: Single    Spouse Name: N/A    Number of Children: N/A  . Years of Education: N/A   Occupational History  . Not on file.   Social History Main Topics  . Smoking status: Never Smoker   . Smokeless tobacco: Never Used  . Alcohol Use: No  . Drug Use: No  . Sexual Activity: No   Other Topics Concern  . Not on file   Social History Narrative  . No narrative on file    Review of Systems  Constitutional: As in history of present illness HENT: Negative for ear pain, nosebleeds, congestion, facial swelling, rhinorrhea, neck pain, neck stiffness and ear discharge.   Eyes: Negative for pain, discharge, redness, itching and visual disturbance.  Respiratory: As in history of present illness  Cardiovascular: Negative for chest pain, palpitations and leg swelling.  Gastrointestinal: Negative for abdominal distention.  Genitourinary: Negative for dysuria, urgency, frequency, hematuria, flank pain, decreased urine volume, difficulty urinating and dyspareunia.  Musculoskeletal: Negative for back pain, joint swelling, arthralgias and gait problem.  Neurological: Negative for dizziness, tremors, seizures, syncope, facial asymmetry, speech difficulty, weakness, light-headedness, numbness and headaches.  Hematological: Negative for adenopathy. Does not bruise/bleed easily.  Psychiatric/Behavioral: Negative for hallucinations, behavioral problems, confusion, dysphoric mood, decreased concentration and agitation.    Objective:   Filed Vitals:   11/10/13 1443  BP: 122/86  Pulse: 98  Temp: 98.7 F (37.1 C)  Resp: 13    Physical Exam  Constitutional: Appears well-developed  and well-nourished. No distress.  HENT: Normocephalic. External right and left ear normal. Oropharynx is clear and moist.  Eyes: Conjunctivae and EOM are normal. PERRLA, no scleral icterus.  Neck: Normal ROM. Neck supple. No JVD. No tracheal deviation. No thyromegaly.  CVS: RRR, S1/S2 +, no murmurs, no gallops, no carotid bruit.  Pulmonary: Effort and breath sounds normal, no stridor, rhonchi, wheezes, rales.  Abdominal: Soft. BS +,  no distension, tenderness, rebound or guarding.  Musculoskeletal: Normal range of motion. No edema and no tenderness.  Lymphadenopathy: No lymphadenopathy noted, cervical, inguinal. Neuro: Alert. Normal reflexes, muscle tone coordination. No cranial nerve deficit. Skin: Skin is warm and dry. No rash noted. Not diaphoretic. No erythema. No pallor.  Psychiatric: Normal mood and affect. Behavior, judgment, thought content normal.   Lab Results  Component Value Date   WBC 8.9 11/03/2013   HGB 9.2* 11/03/2013   HCT 29.0* 11/03/2013   MCV 87.6 11/03/2013   PLT  231 11/03/2013   Lab Results  Component Value Date   CREATININE 0.71 11/06/2013   BUN 21 11/06/2013   NA 143 11/06/2013   K 4.2 11/06/2013   CL 108 11/06/2013   CO2 28 11/06/2013    No results found for this basename: HGBA1C   Lipid Panel  No results found for this basename: chol, trig, hdl, cholhdl, vldl, ldlcalc       Assessment and plan:   Patient Active Problem List   Diagnosis Date Noted  . Pneumonitis 11/05/2013  . Acute respiratory failure 11/03/2013  . HCAP (healthcare-associated pneumonia) 11/02/2013  . Connective tissue disease 11/02/2013  . ILD (interstitial lung disease) 11/02/2013  . Altered mental status 10/22/2013  . Anemia of chronic disease 10/15/2013  . Hematuria 10/15/2013  . History of renal calculi, multiple 10/15/2013  . Abdominal pain, epigastric 10/15/2013  . Nausea 10/15/2013  . Pharyngitis 10/15/2013  . Acute respiratory failure with hypoxia 10/15/2013  . Severe  sepsis(995.92) 10/15/2013  . Seizure disorder 10/14/2013  . Fibromyalgia 10/14/2013  . Pneumonia   . CAP (community acquired pneumonia) 10/05/2013  . Sepsis 10/05/2013  . Chronic pain 10/05/2013  . Hypoxia 10/05/2013  . Leukocytosis 10/05/2013   Acute respiratory failure secondary to healthcare associated pneumonia located within the abdomen and bilateral adenopathy Followup appointment with pulmonology next month Currently on prednisone taper   Provided with home oxygen No shortness of breath today Chest x-ray at pulmonology followup   C. difficile colitis Patient continue Flagyl Nurse visit 5 days to see that area is improving If not the patient will be switched to vancomycin florastor prescribed for one month   Weakness/dizziness We'll check orthostatic blood pressure If orthostatic the patient will be given IV fluids today   Chronic pain Referred patient to a pain clinic Pending pulmonology evaluation the patient may also need referral for rheumatology once the diagnosis of her underlying lung pathology is made  Followup in 5 days for a nurse visit Otherwise follow up in 3 weeks  The patient was given clear instructions to go to ER or return to medical center if symptoms don't improve, worsen or new problems develop. The patient verbalized understanding. The patient was told to call to get any lab results if not heard anything in the next week.

## 2013-11-11 LAB — FUNGUS CULTURE W SMEAR: Fungal Smear: NONE SEEN

## 2013-11-17 ENCOUNTER — Other Ambulatory Visit: Payer: Self-pay

## 2013-11-18 ENCOUNTER — Encounter (HOSPITAL_COMMUNITY): Payer: Self-pay | Admitting: Emergency Medicine

## 2013-11-18 ENCOUNTER — Inpatient Hospital Stay (HOSPITAL_COMMUNITY)
Admission: EM | Admit: 2013-11-18 | Discharge: 2013-11-28 | DRG: 163 | Disposition: A | Discharge status: Skilled Nursing Facility | Payer: Medicaid Other | Attending: Internal Medicine | Admitting: Internal Medicine

## 2013-11-18 ENCOUNTER — Emergency Department (HOSPITAL_COMMUNITY): Payer: Medicaid Other

## 2013-11-18 DIAGNOSIS — J984 Other disorders of lung: Secondary | ICD-10-CM

## 2013-11-18 DIAGNOSIS — J8489 Other specified interstitial pulmonary diseases: Secondary | ICD-10-CM | POA: Diagnosis present

## 2013-11-18 DIAGNOSIS — Z2989 Encounter for other specified prophylactic measures: Secondary | ICD-10-CM

## 2013-11-18 DIAGNOSIS — J189 Pneumonia, unspecified organism: Principal | ICD-10-CM

## 2013-11-18 DIAGNOSIS — F411 Generalized anxiety disorder: Secondary | ICD-10-CM | POA: Diagnosis not present

## 2013-11-18 DIAGNOSIS — D649 Anemia, unspecified: Secondary | ICD-10-CM

## 2013-11-18 DIAGNOSIS — R1013 Epigastric pain: Secondary | ICD-10-CM

## 2013-11-18 DIAGNOSIS — K219 Gastro-esophageal reflux disease without esophagitis: Secondary | ICD-10-CM | POA: Diagnosis present

## 2013-11-18 DIAGNOSIS — Z91013 Allergy to seafood: Secondary | ICD-10-CM

## 2013-11-18 DIAGNOSIS — IMO0001 Reserved for inherently not codable concepts without codable children: Secondary | ICD-10-CM | POA: Diagnosis present

## 2013-11-18 DIAGNOSIS — N200 Calculus of kidney: Secondary | ICD-10-CM | POA: Diagnosis present

## 2013-11-18 DIAGNOSIS — E559 Vitamin D deficiency, unspecified: Secondary | ICD-10-CM | POA: Diagnosis present

## 2013-11-18 DIAGNOSIS — Z79899 Other long term (current) drug therapy: Secondary | ICD-10-CM

## 2013-11-18 DIAGNOSIS — T3995XA Adverse effect of unspecified nonopioid analgesic, antipyretic and antirheumatic, initial encounter: Secondary | ICD-10-CM | POA: Diagnosis not present

## 2013-11-18 DIAGNOSIS — A419 Sepsis, unspecified organism: Secondary | ICD-10-CM

## 2013-11-18 DIAGNOSIS — J849 Interstitial pulmonary disease, unspecified: Secondary | ICD-10-CM

## 2013-11-18 DIAGNOSIS — IMO0002 Reserved for concepts with insufficient information to code with codable children: Secondary | ICD-10-CM

## 2013-11-18 DIAGNOSIS — G43909 Migraine, unspecified, not intractable, without status migrainosus: Secondary | ICD-10-CM | POA: Diagnosis present

## 2013-11-18 DIAGNOSIS — E8809 Other disorders of plasma-protein metabolism, not elsewhere classified: Secondary | ICD-10-CM | POA: Diagnosis present

## 2013-11-18 DIAGNOSIS — R071 Chest pain on breathing: Secondary | ICD-10-CM

## 2013-11-18 DIAGNOSIS — N390 Urinary tract infection, site not specified: Secondary | ICD-10-CM

## 2013-11-18 DIAGNOSIS — I509 Heart failure, unspecified: Secondary | ICD-10-CM | POA: Diagnosis present

## 2013-11-18 DIAGNOSIS — Z418 Encounter for other procedures for purposes other than remedying health state: Secondary | ICD-10-CM

## 2013-11-18 DIAGNOSIS — I5030 Unspecified diastolic (congestive) heart failure: Secondary | ICD-10-CM | POA: Diagnosis present

## 2013-11-18 DIAGNOSIS — D638 Anemia in other chronic diseases classified elsewhere: Secondary | ICD-10-CM | POA: Diagnosis present

## 2013-11-18 DIAGNOSIS — K2960 Other gastritis without bleeding: Secondary | ICD-10-CM | POA: Diagnosis not present

## 2013-11-18 DIAGNOSIS — E43 Unspecified severe protein-calorie malnutrition: Secondary | ICD-10-CM

## 2013-11-18 DIAGNOSIS — G40909 Epilepsy, unspecified, not intractable, without status epilepticus: Secondary | ICD-10-CM | POA: Diagnosis present

## 2013-11-18 DIAGNOSIS — K5900 Constipation, unspecified: Secondary | ICD-10-CM | POA: Diagnosis not present

## 2013-11-18 DIAGNOSIS — M797 Fibromyalgia: Secondary | ICD-10-CM

## 2013-11-18 DIAGNOSIS — J8409 Other alveolar and parieto-alveolar conditions: Secondary | ICD-10-CM | POA: Diagnosis present

## 2013-11-18 DIAGNOSIS — J9601 Acute respiratory failure with hypoxia: Secondary | ICD-10-CM

## 2013-11-18 DIAGNOSIS — E538 Deficiency of other specified B group vitamins: Secondary | ICD-10-CM | POA: Diagnosis present

## 2013-11-18 DIAGNOSIS — J9600 Acute respiratory failure, unspecified whether with hypoxia or hypercapnia: Secondary | ICD-10-CM

## 2013-11-18 DIAGNOSIS — R64 Cachexia: Secondary | ICD-10-CM | POA: Diagnosis present

## 2013-11-18 DIAGNOSIS — E876 Hypokalemia: Secondary | ICD-10-CM | POA: Diagnosis not present

## 2013-11-18 DIAGNOSIS — Z87891 Personal history of nicotine dependence: Secondary | ICD-10-CM

## 2013-11-18 DIAGNOSIS — R0902 Hypoxemia: Secondary | ICD-10-CM

## 2013-11-18 DIAGNOSIS — G8929 Other chronic pain: Secondary | ICD-10-CM

## 2013-11-18 DIAGNOSIS — G8918 Other acute postprocedural pain: Secondary | ICD-10-CM

## 2013-11-18 DIAGNOSIS — A0472 Enterocolitis due to Clostridium difficile, not specified as recurrent: Secondary | ICD-10-CM | POA: Diagnosis present

## 2013-11-18 DIAGNOSIS — M359 Systemic involvement of connective tissue, unspecified: Secondary | ICD-10-CM

## 2013-11-18 DIAGNOSIS — J8410 Pulmonary fibrosis, unspecified: Secondary | ICD-10-CM | POA: Diagnosis present

## 2013-11-18 LAB — CBC WITH DIFFERENTIAL/PLATELET
BASOS ABS: 0 10*3/uL (ref 0.0–0.1)
Basophils Relative: 0 % (ref 0–1)
Eosinophils Absolute: 0.3 10*3/uL (ref 0.0–0.7)
Eosinophils Relative: 1 % (ref 0–5)
HCT: 35.6 % — ABNORMAL LOW (ref 36.0–46.0)
Hemoglobin: 11.2 g/dL — ABNORMAL LOW (ref 12.0–15.0)
LYMPHS ABS: 0.9 10*3/uL (ref 0.7–4.0)
Lymphocytes Relative: 3 % — ABNORMAL LOW (ref 12–46)
MCH: 28 pg (ref 26.0–34.0)
MCHC: 31.5 g/dL (ref 30.0–36.0)
MCV: 89 fL (ref 78.0–100.0)
MONO ABS: 1.6 10*3/uL — AB (ref 0.1–1.0)
Monocytes Relative: 5 % (ref 3–12)
NEUTROS PCT: 91 % — AB (ref 43–77)
Neutro Abs: 28.4 10*3/uL — ABNORMAL HIGH (ref 1.7–7.7)
Platelets: 366 10*3/uL (ref 150–400)
RBC: 4 MIL/uL (ref 3.87–5.11)
RDW: 18.6 % — AB (ref 11.5–15.5)
WBC: 31.2 10*3/uL — ABNORMAL HIGH (ref 4.0–10.5)

## 2013-11-18 LAB — URINALYSIS, ROUTINE W REFLEX MICROSCOPIC
Glucose, UA: NEGATIVE mg/dL
Hgb urine dipstick: NEGATIVE
Ketones, ur: NEGATIVE mg/dL
NITRITE: NEGATIVE
PH: 7 (ref 5.0–8.0)
Protein, ur: NEGATIVE mg/dL
Specific Gravity, Urine: 1.019 (ref 1.005–1.030)
UROBILINOGEN UA: 0.2 mg/dL (ref 0.0–1.0)

## 2013-11-18 LAB — COMPREHENSIVE METABOLIC PANEL
ALT: 7 U/L (ref 0–35)
AST: 16 U/L (ref 0–37)
Albumin: 2.5 g/dL — ABNORMAL LOW (ref 3.5–5.2)
Alkaline Phosphatase: 119 U/L — ABNORMAL HIGH (ref 39–117)
BUN: 8 mg/dL (ref 6–23)
CO2: 32 meq/L (ref 19–32)
Calcium: 9.5 mg/dL (ref 8.4–10.5)
Chloride: 97 mEq/L (ref 96–112)
Creatinine, Ser: 0.55 mg/dL (ref 0.50–1.10)
GLUCOSE: 94 mg/dL (ref 70–99)
Potassium: 4.4 mEq/L (ref 3.7–5.3)
Sodium: 141 mEq/L (ref 137–147)
Total Bilirubin: 0.5 mg/dL (ref 0.3–1.2)
Total Protein: 7.9 g/dL (ref 6.0–8.3)

## 2013-11-18 LAB — URINE MICROSCOPIC-ADD ON

## 2013-11-18 LAB — I-STAT TROPONIN, ED: Troponin i, poc: 0 ng/mL (ref 0.00–0.08)

## 2013-11-18 LAB — D-DIMER, QUANTITATIVE: D-Dimer, Quant: 2.23 ug/mL-FEU — ABNORMAL HIGH (ref 0.00–0.48)

## 2013-11-18 LAB — TROPONIN I: Troponin I: <0.3 ng/mL (ref <0.30)

## 2013-11-18 LAB — PROTIME-INR
INR: 1.43 (ref 0.00–1.49)
Prothrombin Time: 17.1 seconds — ABNORMAL HIGH (ref 11.6–15.2)

## 2013-11-18 LAB — I-STAT CG4 LACTIC ACID, ED: LACTIC ACID, VENOUS: 1.93 mmol/L (ref 0.5–2.2)

## 2013-11-18 LAB — PRO B NATRIURETIC PEPTIDE: Pro B Natriuretic peptide (BNP): 585.4 pg/mL — ABNORMAL HIGH (ref 0–125)

## 2013-11-18 MED ORDER — HEPARIN SODIUM (PORCINE) 5000 UNIT/ML IJ SOLN: 5000.0000 [IU] | Freq: Three times a day (TID) | INTRAMUSCULAR | Status: DC

## 2013-11-18 MED ORDER — VANCOMYCIN HCL IN DEXTROSE 750-5 MG/150ML-% IV SOLN
750.0000 mg | Freq: Two times a day (BID) | INTRAVENOUS | Status: DC
  Administered 2013-11-19 – 2013-11-20 (×4): 750 mg via INTRAVENOUS
  Filled 2013-11-18 (×6): qty 150

## 2013-11-18 MED ORDER — VANCOMYCIN HCL IN DEXTROSE 1-5 GM/200ML-% IV SOLN
1000.0000 mg | Freq: Once | INTRAVENOUS | Status: AC
  Administered 2013-11-18: 1000 mg via INTRAVENOUS
  Filled 2013-11-18: qty 200

## 2013-11-18 MED ORDER — SODIUM CHLORIDE 0.9 % IV SOLN
1000.0000 mL | INTRAVENOUS | Status: DC
  Administered 2013-11-18: 1000 mL via INTRAVENOUS

## 2013-11-18 MED ORDER — MORPHINE SULFATE 4 MG/ML IJ SOLN
4.0000 mg | Freq: Once | INTRAMUSCULAR | Status: AC
Start: 2013-11-18 — End: 2013-11-18
  Administered 2013-11-18: 4 mg via INTRAVENOUS
  Filled 2013-11-18: qty 1

## 2013-11-18 MED ORDER — DEXTROSE 5 % IV SOLN
1.0000 g | Freq: Three times a day (TID) | INTRAVENOUS | Status: DC
  Administered 2013-11-18 – 2013-11-20 (×6): 1 g via INTRAVENOUS
  Filled 2013-11-18 (×10): qty 1

## 2013-11-18 MED ORDER — SACCHAROMYCES BOULARDII 250 MG PO CAPS
250.0000 mg | ORAL_CAPSULE | Freq: Two times a day (BID) | ORAL | Status: DC
  Administered 2013-11-18 – 2013-11-26 (×15): 250 mg via ORAL
  Filled 2013-11-18 (×17): qty 1

## 2013-11-18 MED ORDER — METHYLPREDNISOLONE SODIUM SUCC 125 MG IJ SOLR
80.0000 mg | Freq: Three times a day (TID) | INTRAMUSCULAR | Status: DC
  Administered 2013-11-19 – 2013-11-23 (×13): 80 mg via INTRAVENOUS
  Filled 2013-11-18 (×8): qty 1.28
  Filled 2013-11-18: qty 2
  Filled 2013-11-18 (×6): qty 1.28
  Filled 2013-11-18: qty 2
  Filled 2013-11-18 (×3): qty 1.28

## 2013-11-18 MED ORDER — METRONIDAZOLE 500 MG PO TABS
500.0000 mg | ORAL_TABLET | Freq: Three times a day (TID) | ORAL | Status: DC
  Administered 2013-11-18 – 2013-11-19 (×3): 500 mg via ORAL
  Filled 2013-11-18 (×5): qty 1

## 2013-11-18 MED ORDER — SUMATRIPTAN SUCCINATE 50 MG PO TABS
50.0000 mg | ORAL_TABLET | ORAL | Status: DC | PRN
  Filled 2013-11-18 (×3): qty 1

## 2013-11-18 MED ORDER — IPRATROPIUM BROMIDE 0.02 % IN SOLN: 0.5000 mg | Freq: Four times a day (QID) | RESPIRATORY_TRACT | Status: DC

## 2013-11-18 MED ORDER — LEVETIRACETAM 500 MG PO TABS
500.0000 mg | ORAL_TABLET | Freq: Two times a day (BID) | ORAL | Status: DC
  Administered 2013-11-18 – 2013-11-28 (×19): 500 mg via ORAL
  Filled 2013-11-18 (×21): qty 1

## 2013-11-18 MED ORDER — B-12 2000 MCG PO TABS: 2000.0000 ug | ORAL_TABLET | Freq: Every day | ORAL | Status: DC

## 2013-11-18 MED ORDER — MORPHINE SULFATE 2 MG/ML IJ SOLN
1.0000 mg | INTRAMUSCULAR | Status: DC | PRN
  Administered 2013-11-18 – 2013-11-20 (×9): 1 mg via INTRAVENOUS
  Filled 2013-11-18 (×9): qty 1

## 2013-11-18 MED ORDER — ETODOLAC 300 MG PO CAPS
500.0000 mg | ORAL_CAPSULE | Freq: Two times a day (BID) | ORAL | Status: DC
  Administered 2013-11-18 – 2013-11-20 (×4): 500 mg via ORAL
  Filled 2013-11-18 (×7): qty 1

## 2013-11-18 MED ORDER — SODIUM CHLORIDE 0.9 % IV SOLN
1000.0000 mL | Freq: Once | INTRAVENOUS | Status: AC
  Administered 2013-11-18: 1000 mL via INTRAVENOUS

## 2013-11-18 MED ORDER — FENTANYL 25 MCG/HR TD PT72
50.0000 ug | MEDICATED_PATCH | TRANSDERMAL | Status: DC
  Administered 2013-11-18 – 2013-11-27 (×4): 50 ug via TRANSDERMAL
  Filled 2013-11-18: qty 1
  Filled 2013-11-18 (×2): qty 2
  Filled 2013-11-18: qty 1
  Filled 2013-11-18: qty 2

## 2013-11-18 MED ORDER — ONDANSETRON HCL 4 MG/2ML IJ SOLN
4.0000 mg | Freq: Four times a day (QID) | INTRAMUSCULAR | Status: DC | PRN
  Administered 2013-11-19 – 2013-11-20 (×3): 4 mg via INTRAVENOUS
  Filled 2013-11-18 (×3): qty 2

## 2013-11-18 MED ORDER — IPRATROPIUM-ALBUTEROL 0.5-2.5 (3) MG/3ML IN SOLN
3.0000 mL | Freq: Three times a day (TID) | RESPIRATORY_TRACT | Status: DC
  Administered 2013-11-19 – 2013-11-20 (×3): 3 mL via RESPIRATORY_TRACT
  Filled 2013-11-18 (×7): qty 3

## 2013-11-18 MED ORDER — VITAMIN B-12 1000 MCG PO TABS
2000.0000 ug | ORAL_TABLET | Freq: Every day | ORAL | Status: DC
  Administered 2013-11-19 – 2013-11-28 (×9): 2000 ug via ORAL
  Filled 2013-11-18 (×10): qty 2

## 2013-11-18 MED ORDER — POLYETHYLENE GLYCOL 3350 17 G PO PACK
17.0000 g | PACK | Freq: Every day | ORAL | Status: DC | PRN
  Filled 2013-11-18: qty 1

## 2013-11-18 MED ORDER — CEFEPIME HCL 2 G IJ SOLR
2.0000 g | Freq: Once | INTRAMUSCULAR | Status: AC
  Administered 2013-11-18: 2 g via INTRAVENOUS

## 2013-11-18 MED ORDER — SODIUM CHLORIDE 0.9 % IJ SOLN
3.0000 mL | Freq: Two times a day (BID) | INTRAMUSCULAR | Status: DC
  Administered 2013-11-18 – 2013-11-20 (×4): 3 mL via INTRAVENOUS

## 2013-11-18 MED ORDER — PIPERACILLIN-TAZOBACTAM 3.375 G IVPB 30 MIN: 3.3750 g | Freq: Once | INTRAVENOUS | Status: DC

## 2013-11-18 MED ORDER — TRAMADOL HCL 50 MG PO TABS
50.0000 mg | ORAL_TABLET | Freq: Two times a day (BID) | ORAL | Status: DC | PRN
  Administered 2013-11-20: 50 mg via ORAL
  Filled 2013-11-18: qty 1

## 2013-11-18 MED ORDER — ALBUTEROL SULFATE (2.5 MG/3ML) 0.083% IN NEBU: 2.5000 mg | INHALATION_SOLUTION | Freq: Four times a day (QID) | RESPIRATORY_TRACT | Status: DC

## 2013-11-18 MED ORDER — IPRATROPIUM-ALBUTEROL 0.5-2.5 (3) MG/3ML IN SOLN
3.0000 mL | Freq: Four times a day (QID) | RESPIRATORY_TRACT | Status: DC
  Administered 2013-11-18: 3 mL via RESPIRATORY_TRACT
  Filled 2013-11-18: qty 3

## 2013-11-18 MED ORDER — ONDANSETRON HCL 4 MG PO TABS: 4.0000 mg | ORAL_TABLET | Freq: Four times a day (QID) | ORAL | Status: DC | PRN

## 2013-11-18 MED ORDER — PANTOPRAZOLE SODIUM 40 MG PO TBEC
40.0000 mg | DELAYED_RELEASE_TABLET | Freq: Two times a day (BID) | ORAL | Status: DC
  Administered 2013-11-18 – 2013-11-28 (×19): 40 mg via ORAL
  Filled 2013-11-18 (×16): qty 1

## 2013-11-18 MED ORDER — VANCOMYCIN HCL IN DEXTROSE 1-5 GM/200ML-% IV SOLN: 1000.0000 mg | Freq: Once | INTRAVENOUS | Status: DC

## 2013-11-18 MED ORDER — FOLIC ACID 1 MG PO TABS
1.0000 mg | ORAL_TABLET | Freq: Every day | ORAL | Status: DC
  Administered 2013-11-19 – 2013-11-28 (×9): 1 mg via ORAL
  Filled 2013-11-18 (×10): qty 1

## 2013-11-18 MED ORDER — SODIUM CHLORIDE 0.9 % IV SOLN
INTRAVENOUS | Status: DC
  Administered 2013-11-18: 16:00:00 via INTRAVENOUS

## 2013-11-18 MED ORDER — ETODOLAC 500 MG PO TABS
500.0000 mg | ORAL_TABLET | Freq: Two times a day (BID) | ORAL | Status: DC
  Filled 2013-11-18: qty 1

## 2013-11-18 MED ORDER — HEPARIN SODIUM (PORCINE) 5000 UNIT/ML IJ SOLN
5000.0000 [IU] | Freq: Three times a day (TID) | INTRAMUSCULAR | Status: DC
  Administered 2013-11-18 – 2013-11-19 (×2): 5000 [IU] via SUBCUTANEOUS
  Filled 2013-11-18 (×6): qty 1

## 2013-11-18 NOTE — ED Notes (Signed)
MD at bedside. 

## 2013-11-18 NOTE — ED Provider Notes (Signed)
Medical screening examination/treatment/procedure(s) were conducted as a shared visit with non-physician practitioner(s) and myself.  I personally evaluated the patient during the encounter.   EKG Interpretation None       Anayelli Lai, MD 11/18/13 1604 

## 2013-11-18 NOTE — Progress Notes (Signed)
Pt c/o chest pain; no PRN meds ordered; MD paged at this time; will await callback.

## 2013-11-18 NOTE — Progress Notes (Signed)
MD paged at this time; pt still NPO and requesting a diet order; will await callback.

## 2013-11-18 NOTE — ED Provider Notes (Signed)
Presents with chest pain and shortness of breath and cough productive of greenish sputum onset last night. Admits to subjective fever. On exam patient is chronically ill-appearing conjunctiva are pale lungs diffuse rails. Coughing occasionally. Heart tachycardic. Extremities without edema.  Date: 11/18/2013  Rate: 130  Rhythm: sinus tachycardia  QRS Axis: normal  Intervals: normal  ST/T Wave abnormalities: normal  Conduction Disutrbances:none  Narrative Interpretation:   Old EKG Reviewed: Rate has increased since last racing otherwise no significant change   Doug SouSam Oumar Marcott, MD 11/18/13 1558

## 2013-11-18 NOTE — Progress Notes (Addendum)
ANTIBIOTIC CONSULT NOTE - INITIAL  Pharmacy Consult:  Vancomycin / Cefepime Indication:  PNA  Allergies  Allergen Reactions  . Shellfish Allergy Anaphylaxis    Patient Measurements: Height: 5' 1.02" (155 cm) Weight: 124 lb 1.9 oz (56.3 kg) IBW/kg (Calculated) : 47.86  Vital Signs: Temp: 100 F (37.8 C) (03/03 0945) Temp src: Rectal (03/03 0945) BP: 127/93 mmHg (03/03 1000) Pulse Rate: 117 (03/03 1000)  Labs: No results found for this basename: WBC, HGB, PLT, LABCREA, CREATININE,  in the last 72 hours Estimated Creatinine Clearance: 62.9 ml/min (by C-G formula based on Cr of 0.61). No results found for this basename: VANCOTROUGH, Corlis Leak, VANCORANDOM, GENTTROUGH, GENTPEAK, GENTRANDOM, TOBRATROUGH, TOBRAPEAK, TOBRARND, AMIKACINPEAK, AMIKACINTROU, AMIKACIN,  in the last 72 hours   Microbiology: Recent Results (from the past 720 hour(s))  CLOSTRIDIUM DIFFICILE BY PCR     Status: None   Collection Time    10/22/13 12:03 PM      Result Value Ref Range Status   C difficile by pcr NEGATIVE  NEGATIVE Final  URINE CULTURE     Status: None   Collection Time    10/22/13  9:15 PM      Result Value Ref Range Status   Specimen Description URINE, CATHETERIZED   Final   Special Requests NONE   Final   Culture  Setup Time     Final   Value: 10/22/2013 22:16     Performed at [place]     Final   Value: NO GROWTH     Performed at [facility]   Culture     Final   Value: NO GROWTH     Performed at [facility]   Report Status 10/23/2013 FINAL   Final  CULTURE, BLOOD (ROUTINE X 2)     Status: None   Collection Time    10/22/13  9:51 PM      Result Value Ref Range Status   Specimen Description BLOOD LEFT ARM   Final   Special Requests BOTTLES DRAWN AEROBIC AND ANAEROBIC 10CC   Final   Culture  Setup Time     Final   Value: 10/23/2013 00:26     Performed at [facility]   Culture     Final   Value: STAPHYLOCOCCUS SPECIES  (COAGULASE NEGATIVE)     Note: THE SIGNIFICANCE OF ISOLATING THIS ORGANISM FROM A SINGLE SET OF BLOOD CULTURES WHEN MULTIPLE SETS ARE DRAWN IS UNCERTAIN. PLEASE NOTIFY THE MICROBIOLOGY DEPARTMENT WITHIN ONE WEEK IF SPECIATION AND SENSITIVITIES ARE REQUIRED.     Note: Gram Stain Report Called to,Read Back By and Verified With: TINA SAVAGE ON 10/23/2013 AT 10:31P BY WILEJ     Performed at [facility]   Report Status 10/25/2013 FINAL   Final  CULTURE, BLOOD (ROUTINE X 2)     Status: None   Collection Time    10/22/13 11:10 PM      Result Value Ref Range Status   Specimen Description BLOOD LEFT FINGER   Final   Special Requests BOTTLES DRAWN AEROBIC ONLY 2CC FROM LEFT THUMB   Final   Culture  Setup Time     Final   Value: 10/23/2013 08:45     Performed at [facility]   Culture     Final   Value: NO GROWTH 5 DAYS     Performed at [facility]   Report Status 10/29/2013 FINAL   Final  WET PREP, GENITAL  Status: Abnormal   Collection Time    10/23/13  7:16 PM      Result Value Ref Range Status   Yeast Wet Prep HPF POC NONE SEEN  NONE SEEN Final   Trich, Wet Prep NONE SEEN  NONE SEEN Final   Clue Cells Wet Prep HPF POC NONE SEEN  NONE SEEN Final   WBC, Wet Prep HPF POC MODERATE (*) NONE SEEN Final  CULTURE, BLOOD (ROUTINE X 2)     Status: None   Collection Time    10/24/13  2:40 AM      Result Value Ref Range Status   Specimen Description BLOOD RIGHT ARM   Final   Special Requests BOTTLES DRAWN AEROBIC ONLY 10CC   Final   Culture  Setup Time     Final   Value: 10/24/2013 08:30     Performed at [facility]   Culture     Final   Value: NO GROWTH 5 DAYS     Performed at [facility]   Report Status 10/30/2013 FINAL   Final  CULTURE, BLOOD (ROUTINE X 2)     Status: None   Collection Time    10/24/13  2:50 AM      Result Value Ref Range Status   Specimen Description BLOOD RIGHT HAND   Final   Special Requests     Final   Value: BOTTLES DRAWN  AEROBIC AND ANAEROBIC 10CC BLUE,7CC RED   Culture  Setup Time     Final   Value: 10/24/2013 08:30     Performed at [facility]   Culture     Final   Value: NO GROWTH 5 DAYS     Performed at [facility]   Report Status 10/30/2013 FINAL   Final  URINE CULTURE     Status: None   Collection Time    10/26/13  8:36 AM      Result Value Ref Range Status   Specimen Description URINE, RANDOM   Final   Special Requests NONE   Final   Culture  Setup Time     Final   Value: 10/26/2013 18:01     Performed at [facility]     Final   Value: NO GROWTH     Performed at [facility]   Culture     Final   Value: NO GROWTH     Performed at [facility]   Report Status 10/27/2013 FINAL   Final  CULTURE, BLOOD (ROUTINE X 2)     Status: None   Collection Time    11/02/13 12:35 AM      Result Value Ref Range Status   Specimen Description BLOOD RIGHT ANTECUBITAL   Final   Special Requests BOTTLES DRAWN AEROBIC ONLY 10CC   Final   Culture  Setup Time     Final   Value: 11/02/2013 14:36     Performed at [facility]   Culture     Final   Value: NO GROWTH 5 DAYS     Performed at [facility]   Report Status 11/08/2013 FINAL   Final  CULTURE, BLOOD (ROUTINE X 2)     Status: None   Collection Time    11/02/13 12:40 AM      Result Value Ref Range Status   Specimen Description BLOOD ARM LEFT   Final   Special Requests BOTTLES DRAWN AEROBIC AND ANAEROBIC 5CC  Final   Culture  Setup Time     Final   Value: 11/02/2013 14:36     Performed at [facility]   Culture     Final   Value: NO GROWTH 5 DAYS     Performed at [facility]   Report Status 11/08/2013 FINAL   Final  URINE CULTURE     Status: None   Collection Time    11/02/13  3:34 AM      Result Value Ref Range Status   Specimen Description URINE, CLEAN CATCH   Final   Special Requests NONE   Final   Culture  Setup Time     Final   Value: 11/02/2013  14:53     Performed at [place]     Final   Value: NO GROWTH     Performed at [facility]   Culture     Final   Value: NO GROWTH     Performed at [facility]   Report Status 11/03/2013 FINAL   Final  MRSA PCR SCREENING     Status: None   Collection Time    11/02/13  5:44 AM      Result Value Ref Range Status   MRSA by PCR NEGATIVE  NEGATIVE Final   Comment:            The GeneXpert MRSA Assay (FDA     approved for NASAL specimens     only), is one component of a     comprehensive MRSA colonization     surveillance program. It is not     intended to diagnose MRSA     infection nor to guide or     monitor treatment for     MRSA infections.  RESPIRATORY VIRUS PANEL     Status: None   Collection Time    11/02/13  7:37 AM      Result Value Ref Range Status   Source - RVPAN NASAL SWAB   Corrected   Comment: CORRECTED ON 02/16 AT 1808: PREVIOUSLY REPORTED AS NASAL SWAB   Respiratory Syncytial Virus A NOT DETECTED   Final   Respiratory Syncytial Virus B NOT DETECTED   Final   Influenza A NOT DETECTED   Final   Influenza B NOT DETECTED   Final   Parainfluenza 1 NOT DETECTED   Final   Parainfluenza 2 NOT DETECTED   Final   Parainfluenza 3 NOT DETECTED   Final   Metapneumovirus NOT DETECTED   Final   Rhinovirus NOT DETECTED   Final   Adenovirus NOT DETECTED   Final   Influenza A H1 NOT DETECTED   Final   Influenza A H3 NOT DETECTED   Final   Comment: (NOTE)           Normal Reference Range for each Analyte: NOT DETECTED     Testing performed using the Luminex xTAG Respiratory Viral Panel test     kit.     This test was developed and its performance characteristics determined     by [facility]. It has not been cleared or approved by the [place]     Food and Drug Administration. This test is used for clinical purposes.     It should not be regarded as investigational or for research. This     laboratory is certified under the  [place] (CLIA) as qualified  to perform high complexity     clinical laboratory testing.     Performed at [facility] DIFFICILE BY PCR     Status: Abnormal   Collection Time    11/06/13  3:38 PM      Result Value Ref Range Status   C difficile by pcr POSITIVE (*) NEGATIVE Final   Comment: CRITICAL RESULT CALLED TO, READ BACK BY AND VERIFIED WITH:     Thomasenia Bottoms RN 17:40 11/06/13 ([email or web address])    Medical History: Past Medical History  Diagnosis Date  . Fibromyalgia   . Chronic pain   . Seizures   . Migraine   . Pneumonia       Assessment: 15 YOF hospitalized within the last 90 days for CAP complicated by septic shock.  She returned today with chest pain.  Pharmacy consulted to manage vancomycin and cefepime for PNA.  First doses of antibiotics already ordered and baseline labs reviewed.   Goal of Therapy:  Vancomycin trough level 15-20 mcg/ml   Plan:  - Vanc 754m IV Q12H - Cefepime 1gm IV Q8H - Monitor renal fxn, clinical course, vanc trough as appropriate - F/U continue Flagyl to complete C.diff treatment course     Suleyman Ehrman D. DMina Marble PharmD, BCPS Pager:  [phone]/11/2013, 11:13 AM

## 2013-11-18 NOTE — Consult Note (Signed)
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 Just discharged on 2/19 w/ recommendations for steroid taper w/ suggestion that she would need Open lung bx should this re-occur. At time of d/c her CXR was essentially clear. She was re-admitted on 3/3 w/ CC: 1 d/ h/o chest pain and progressive dyspnea. CXR w/ reoccurrence of bilateral pulmonary infiltrates. PCCM was asked to eval.    SIGNIFICANT EVENTS / STUDIES:  10/14/13 - ADMIT  1/281/28 Abd US >>No hydronephrosis, Left kidney 8mm stone?, Right kidney 6mm stone? Mild intra and extrahepatic biliary ductal prominence,  1/28 CTA chest >>> No PE. Bilateral prominently upper lobe mixed airspace and ground-glass opacities, multi lobar pneumonia is favored. Mediastinal and bilateral hilar adenopathy  1/28 Bronchoscopy >>> unimpressive secretions, no DAH, no hsv lesions, no mass lesions, BAL sent.  1/28 TTE >>> EF 65-70%, grade 1 DD, PA peak 38, no change from prior  10/15/13 BRONCH BAL - 93% POLYS (Ddx is acute infection, AIP, aspiration)  1/28 CT head >>> Negative for acute process.  1/29 EEG >>> no evidence of seizure activity  1/31 2. 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 Rest negative. Resp VIrus PCR and culture - negative  2/4 off precedex overnight, improved orientation  TTE >>> EF 65%, grade 1 systolic dysfunction  10/26/13 - DC on 8 day pred taper. Did NOT go Home on oxygen  11/02/13 - readmitted for dysnea, BNP 3000. Diuresed  ...................Marland Kitchen.  READMIT 11/02/2013  2/15 Chest CT >>> No pulmonary  embolism, persistent upper lobe predominant bilateral airspace and ground-glass opacities, improved relative to most recent CT from 1/28, mediastinal and bilateral adenopathy is above READMIT 3/3 CT chest 3/3>>>  LINES / TUBES:   CULTURES:   ANTIBIOTICS: Cefepime 3/3>>> vanc 3/3>>> Flagyl 3/3>>>  HISTORY OF PRESENT ILLNESS:   See history of events above. Currently down to 20mg /day on pred.   PAST MEDICAL HISTORY :  Past Medical History  Diagnosis Date  . Fibromyalgia   . Chronic pain   . Seizures   . Migraine   . Pneumonia    History reviewed. No pertinent past surgical history. Prior to Admission medications   Medication Sig Start Date End Date Taking? Authorizing Provider  Cyanocobalamin (B-12) 2000 MCG TABS Take 2,000 mcg by mouth daily. 10/28/13  Yes Boykin PeekJacquelyn Gill, MD  etodolac (LODINE) 500 MG tablet Take 500 mg by mouth 2 (two) times daily.    Yes Historical Provider, MD  fentaNYL (DURAGESIC - DOSED MCG/HR) 50 MCG/HR Place 1 patch (50 mcg total) onto the skin every 3 (three) days. 11/06/13  Yes Belia HemanNeema K Sharda, MD  folic acid (FOLVITE) 1 MG tablet Take 1 tablet daily. 10/28/13  Yes Boykin PeekJacquelyn Gill, MD  levETIRAcetam (KEPPRA) 500 MG tablet Take 1 tablet (500 mg total) by mouth 2 (two) times daily. 11/06/13  Yes Neema Davina PokeK Sharda, MD  metroNIDAZOLE (FLAGYL) 500 MG tablet Take 1 tablet (500 mg total) by mouth 3 (three) times daily. 11/06/13 11/20/13 Yes Neema Davina PokeK Sharda, MD  pantoprazole (PROTONIX) 40 MG tablet  Take 1 tablet (40 mg total) by mouth 2 (two) times daily. 11/06/13  Yes Neema Davina Poke, MD  polyethylene glycol (MIRALAX / GLYCOLAX) packet Take 17 g by mouth daily as needed for mild constipation or moderate constipation. 10/10/13  Yes Darden Palmer, MD  potassium chloride (K-DUR) 10 MEQ tablet Take 1 tablet (10 mEq total) by mouth daily. 10/28/13  Yes Boykin Peek, MD  predniSONE (DELTASONE) 10 MG tablet 3 tabs po daily x 7 days then 2 tabs po daily x 7 days then 1 tab po daily x  7 days then STOP 11/06/13  Yes Bernadene Person, NP  pregabalin (LYRICA) 300 MG capsule Take 1 capsule (300 mg total) by mouth 2 (two) times daily. 11/06/13  Yes Neema Davina Poke, MD  rizatriptan (MAXALT) 10 MG tablet Take 10 mg by mouth daily as needed for migraine. May repeat in 2 hours if needed   Yes Historical Provider, MD  saccharomyces boulardii (FLORASTOR) 250 MG capsule Take 1 capsule (250 mg total) by mouth 2 (two) times daily. 11/10/13  Yes Richarda Overlie, MD  traMADol (ULTRAM) 50 MG tablet Take 1 tablet (50 mg total) by mouth every 12 (twelve) hours as needed for moderate pain. 11/10/13  Yes Richarda Overlie, MD  Vitamin D, Ergocalciferol, (DRISDOL) 50000 UNITS CAPS capsule Take 50,000 Units by mouth every 7 (seven) days. Take on Saturday   Yes Historical Provider, MD   Allergies  Allergen Reactions  . Shellfish Allergy Anaphylaxis    FAMILY HISTORY:  History reviewed. No pertinent family history. SOCIAL HISTORY:  reports that she has never smoked. She has never used smokeless tobacco. She reports that she does not drink alcohol or use illicit drugs.  REVIEW OF SYSTEMS (bolds are positive):   Constitutional: Negative for fever, chills, weight loss, malaise/fatigue and diaphoresis.  HENT: Negative for hearing loss, ear pain, nosebleeds, congestion, sore throat, neck pain, tinnitus and ear discharge.   Eyes: Negative for blurred vision, double vision, photophobia, pain, discharge and redness.  Respiratory: Negative for cough, hemoptysis, sputum production, shortness of breath, wheezing and stridor.   Cardiovascular: Negative for chest pain, palpitations, orthopnea, claudication, leg swelling and PND.  Gastrointestinal: Negative for heartburn, nausea, vomiting, abdominal pain, diarrhea, constipation, blood in stool and melena.  Genitourinary: Negative for dysuria, urgency, frequency, hematuria and flank pain.  Musculoskeletal: Negative for myalgias, back pain, joint pain and falls.  Skin:  Negative for itching and rash.  Neurological: Negative for dizziness, tingling, tremors, sensory change, speech change, focal weakness, seizures, loss of consciousness, weakness and headaches.  Endo/Heme/Allergies: Negative for environmental allergies and polydipsia. Does not bruise/bleed easily.  SUBJECTIVE:  More sob VITAL SIGNS: Temp:  [98.7 F (37.1 C)-100 F (37.8 C)] 98.9 F (37.2 C) (03/03 1958) Pulse Rate:  [102-128] 106 (03/03 1958) Resp:  [20-32] 21 (03/03 1958) BP: (124-161)/(82-108) 124/82 mmHg (03/03 1958) SpO2:  [87 %-100 %] 99 % (03/03 1958) Weight:  [56.3 kg (124 lb 1.9 oz)] 56.3 kg (124 lb 1.9 oz) (03/03 1000)  PHYSICAL EXAMINATION: General:  Not in acute distress, but anxious  Neuro:  Awake, alert, no focal def  HEENT:  [place], no JVD  Cardiovascular:  rrr Lungs:  Dry crackles bases Abdomen:  Soft, non-tender Musculoskeletal:  Intact  Skin:  Intact    Recent Labs Lab 11/18/13 0945  NA 141  K 4.4  CL 97  CO2 32  BUN 8  CREATININE 0.55  GLUCOSE 94    Recent Labs Lab 11/18/13 0945  HGB 11.2*  HCT 35.6*  WBC 31.2*  PLT 366   Dg Chest 2 View  11/18/2013   CLINICAL DATA:  Chest pain and short of breath  EXAM: CHEST  2 VIEW  COMPARISON:  11/05/2013  FINDINGS: Interval development of upper lobe infiltrates bilaterally. Lung bases are clear. No pleural effusion. Negative for heart failure. Heart size is normal.  IMPRESSION: Interval development of upper lobe infiltrates bilaterally, probable pneumonia   Electronically Signed   By: Marlan Palau M.D.   On: 11/18/2013 10:40    ASSESSMENT / PLAN:  Recurrent pneumonitis w/ associated CP of unclear etiology. Has had extensive eval during last 2 stays. What we know is that it has been steroid responsive in the past.  Plan Repeat non-contrast CT chest Lasix X 1 (has h/o diastolic dysfxn) Will start solumedrol because she is having significant clinical decline and want to avoid progression to intubation Will  need open lung biopsy likely after review of CT repeat Move to sdu  Cdiff/seizure d/o, fibromyalgia  Plan Per primary service  Mcarthur Rossetti. Tyson Alias, MD, FACP Pgr: [phone] [facility]  [facility]ulmonary [facility] Critical [facility] Pager: [phone]  11/18/2013, 11:25 PM

## 2013-11-18 NOTE — Progress Notes (Signed)
Utilization Review Completed.[name] [name] T3/11/2013  

## 2013-11-18 NOTE — H&P (Signed)
Date: 11/18/2013               Patient Name:  [name] [name] MRN: [id number]  DOB: August 06, 1961 Age / Sex: 53 y.o., female   PCP: Rocco Serene, MD         Medical Service: Internal Medicine Teaching Service         Attending Physician: Dr. Jonah Blue, DO    First Contact: Dr. Johna Roles Pager: [phone]  Second Contact: Dr. Garald Braver   Pager: [phone]       After Hours (After 5p/  First Contact Pager: [phone]  weekends / holidays): Second Contact Pager: [phone]   Chief Complaint: chest pain   History of Present Illness:   Mariane Burpee is a 53 year old [place] female with past medical history of fibromyalgia, seizure disorder, and migraines with recent admissions for CAP (1/18-1/23), HCAP complicated by septic shock and AMS requiring ICU-level care (1/27-2/10/15), and (2/15-19) for acute respiratory failure with treatment with corticosteroid therapy who presents with acute onset chest pain. Pt reports that 1 day while lying down she began having acute onset of left sided chest pain worsened with deep breathing. Pt also reports palpitations and diaphoresis. She has been on 3.5 L of oxygen as needed at home however in the past 2-3 days has been requiring it continuously. She denies fever, but does reports chills and green sputum cough for the past 2 days. She reports compliance with her steroid taper (for 6 weeks) that she was discharged on. She was also taking her flagyl for c. diff infection as prescribed with improvement of loose stools (last BM yesterday was loose).   On her last admission her O2 sats continued to drop into the upper 80's and she was discharged on home oxygen for PRN use. Repeat CXR the day prior to admission showed near complete resolution of her bilateral pulmonary infiltrates She was to follow-up with pulmonology for repeat CT in 4-6 weeks to evaluate for mediastinal lymphadenopathy and possible lung biopsy.       Meds: No current facility-administered medications on  file prior to encounter.   Current Outpatient Prescriptions on File Prior to Encounter  Medication Sig Dispense Refill  . Cyanocobalamin (B-12) 2000 MCG TABS Take 2,000 mcg by mouth daily.  30 tablet  12  . etodolac (LODINE) 500 MG tablet Take 500 mg by mouth 2 (two) times daily.       . fentaNYL (DURAGESIC - DOSED MCG/HR) 50 MCG/HR Place 1 patch (50 mcg total) onto the skin every 3 (three) days.  10 patch  0  . folic acid (FOLVITE) 1 MG tablet Take 1 tablet daily.  30 tablet  0  . levETIRAcetam (KEPPRA) 500 MG tablet Take 1 tablet (500 mg total) by mouth 2 (two) times daily.  60 tablet  2  . metroNIDAZOLE (FLAGYL) 500 MG tablet Take 1 tablet (500 mg total) by mouth 3 (three) times daily.  30 tablet  0  . pantoprazole (PROTONIX) 40 MG tablet Take 1 tablet (40 mg total) by mouth 2 (two) times daily.  30 tablet  0  . polyethylene glycol (MIRALAX / GLYCOLAX) packet Take 17 g by mouth daily as needed for mild constipation or moderate constipation.  14 each  0  . potassium chloride (K-DUR) 10 MEQ tablet Take 1 tablet (10 mEq total) by mouth daily.  30 tablet  0  . predniSONE (DELTASONE) 10 MG tablet 3 tabs po daily x 7 days then 2 tabs po daily x 7 days  then 1 tab po daily x 7 days then STOP  42 tablet  0  . pregabalin (LYRICA) 300 MG capsule Take 1 capsule (300 mg total) by mouth 2 (two) times daily.  60 capsule  0  . rizatriptan (MAXALT) 10 MG tablet Take 10 mg by mouth daily as needed for migraine. May repeat in 2 hours if needed      . saccharomyces boulardii (FLORASTOR) 250 MG capsule Take 1 capsule (250 mg total) by mouth 2 (two) times daily.  60 capsule  2  . traMADol (ULTRAM) 50 MG tablet Take 1 tablet (50 mg total) by mouth every 12 (twelve) hours as needed for moderate pain.  60 tablet  0  . Vitamin D, Ergocalciferol, (DRISDOL) 50000 UNITS CAPS capsule Take 50,000 Units by mouth every 7 (seven) days. Take on Saturday         Current Facility-Administered Medications  Medication Dose Route  Frequency Provider Last Rate Last Dose  . 0.9 %  sodium chloride infusion  1,000 mL Intravenous Continuous Mora Bellman, PA-C 125 mL/hr at 11/18/13 1129 1,000 mL at 11/18/13 1129  . 0.9 %  sodium chloride infusion   Intravenous Continuous Ky Barban, MD 75 mL/hr at 11/18/13 1602    . ceFEPIme (MAXIPIME) 1 g in dextrose 5 % 50 mL IVPB  1 g Intravenous Q8H Thuy Dien Dang, RPH      . etodolac (LODINE) capsule 500 mg  500 mg Oral BID Jonah Blue, DO      . fentaNYL (DURAGESIC - dosed mcg/hr) 50 mcg  50 mcg Transdermal Q72H Ky Barban, MD   50 mcg at 11/18/13 1603  . ipratropium-albuterol (DUONEB) 0.5-2.5 (3) MG/3ML nebulizer solution 3 mL  3 mL Nebulization Q6H Jonah Blue, DO      . levETIRAcetam (KEPPRA) tablet 500 mg  500 mg Oral BID Ky Barban, MD   500 mg at 11/18/13 1605  . metroNIDAZOLE (FLAGYL) tablet 500 mg  500 mg Oral TID Ky Barban, MD   500 mg at 11/18/13 1636  . morphine 2 MG/ML injection 1 mg  1 mg Intravenous Q4H PRN Ky Barban, MD   1 mg at 11/18/13 1603  . ondansetron (ZOFRAN) tablet 4 mg  4 mg Oral Q6H PRN Ky Barban, MD       Or  . ondansetron (ZOFRAN) injection 4 mg  4 mg Intravenous Q6H PRN Ky Barban, MD      . pantoprazole (PROTONIX) EC tablet 40 mg  40 mg Oral BID Ky Barban, MD   40 mg at 11/18/13 1700  . polyethylene glycol (MIRALAX / GLYCOLAX) packet 17 g  17 g Oral Daily PRN Ky Barban, MD      . saccharomyces boulardii (FLORASTOR) capsule 250 mg  250 mg Oral BID Ky Barban, MD   250 mg at 11/18/13 1605  . sodium chloride 0.9 % injection 3 mL  3 mL Intravenous Q12H Ky Barban, MD   3 mL at 11/18/13 1603  . SUMAtriptan (IMITREX) tablet 50 mg  50 mg Oral Q2H PRN Ky Barban, MD      . traMADol Janean Sark) tablet 50 mg  50 mg Oral Q12H PRN Ky Barban, MD      . Melene Muller ON 11/19/2013] vancomycin (VANCOCIN) IVPB 750 mg/150 ml premix  750 mg Intravenous Q12H  Thuy Earlie Raveling, [facility]        Allergies: Allergies as of 11/18/2013 - Review Complete  11/18/2013  Allergen Reaction Noted  . Shellfish allergy Anaphylaxis 10/05/2013   Past Medical History  Diagnosis Date  . Fibromyalgia   . Chronic pain   . Seizures   . Migraine   . Pneumonia    History reviewed. No pertinent past surgical history. History reviewed. No pertinent family history. History   Social History  . Marital Status: Single    Spouse Name: N/A    Number of Children: N/A  . Years of Education: N/A   Occupational History  . Not on file.   Social History Main Topics  . Smoking status: Never Smoker   . Smokeless tobacco: Never Used  . Alcohol Use: No  . Drug Use: No  . Sexual Activity: No   Other Topics Concern  . Not on file   Social History Narrative  . No narrative on file    Review of Systems: Review of Systems  Constitutional: Positive for chills, malaise/fatigue and diaphoresis. Negative for fever.  HENT: Negative for congestion and sore throat.   Eyes: Positive for blurred vision (at baseline).  Respiratory: Positive for cough, sputum production, shortness of breath and wheezing. Negative for hemoptysis.   Cardiovascular: Positive for chest pain, palpitations and leg swelling.  Gastrointestinal: Positive for diarrhea (improving). Negative for nausea, vomiting, constipation and blood in stool.  Genitourinary: Negative for dysuria, urgency and frequency.  Musculoskeletal: Positive for back pain (chronic) and myalgias (chronic).  Neurological: Negative for dizziness, seizures and headaches.    Physical Exam: Blood pressure 161/106, pulse 102, temperature 99 F (37.2 C), temperature source Oral, resp. rate 24, height 5' 1.02" (1.55 m), weight 56.3 kg (124 lb 1.9 oz), SpO2 97.00%. Physical Exam  Constitutional: She is oriented to person, place, and time. No distress.  Ill-appearing  HENT:  Head: Normocephalic and atraumatic.  Right Ear: External ear  normal.  Left Ear: External ear normal.  Nose: Nose normal.  Mouth/Throat: Oropharynx is clear and moist. No oropharyngeal exudate.  Eyes: Pupils are equal, round, and reactive to light.  Neck: Normal range of motion. Neck supple.  Cardiovascular: Regular rhythm and normal heart sounds.   tachycardic   Pulmonary/Chest: She has rales (diffuse in anterior and posterior (worse in upper lobes)).  Dyspnea with speaking full sentances  Abdominal: Soft. Bowel sounds are normal. She exhibits no distension. There is tenderness (epigastric). There is no rebound and no guarding.  Musculoskeletal: Normal range of motion. She exhibits edema (trace b/l) and tenderness (diffuse ).  Neurological: She is alert and oriented to person, place, and time. No cranial nerve deficit.  Skin: Skin is warm and dry. No rash noted. She is not diaphoretic. No erythema. No pallor.  Psychiatric: She has a normal mood and affect. Her behavior is normal. Judgment and thought content normal.    Lab results: Basic Metabolic Panel:  Recent Labs  28/41/3201/11/30 0945  NA 141  K 4.4  CL 97  CO2 32  GLUCOSE 94  BUN 8  CREATININE 0.55  CALCIUM 9.5   Liver Function Tests:  Recent Labs  11/18/13 0945  AST 16  ALT 7  ALKPHOS 119*  BILITOT 0.5  PROT 7.9  ALBUMIN 2.5*   CBC:  Recent Labs  11/18/13 0945  WBC 31.2*  NEUTROABS 28.4*  HGB 11.2*  HCT 35.6*  MCV 89.0  PLT 366   Cardiac Enzymes:  Recent Labs  11/18/13 1659  TROPONINI <0.30  i-stat trop 0  BNP:  Recent Labs  11/18/13 0945  PROBNP 585.4*  D-Dimer:  Recent Labs  11/18/13 0945  DDIMER 2.23*   Coagulation:  Recent Labs  11/18/13 1657  LABPROT 17.1*  INR 1.43    Urinalysis:  Recent Labs  11/18/13 1219  COLORURINE YELLOW  LABSPEC 1.019  PHURINE 7.0  GLUCOSEU NEGATIVE  HGBUR NEGATIVE  BILIRUBINUR MODERATE*  KETONESUR NEGATIVE  PROTEINUR NEGATIVE  UROBILINOGEN 0.2  NITRITE NEGATIVE  LEUKOCYTESUR MODERATE*     Imaging results:  Dg Chest 2 View  11/18/2013   CLINICAL DATA:  Chest pain and short of breath  EXAM: CHEST  2 VIEW  COMPARISON:  11/05/2013  FINDINGS: Interval development of upper lobe infiltrates bilaterally. Lung bases are clear. No pleural effusion. Negative for heart failure. Heart size is normal.  IMPRESSION: Interval development of upper lobe infiltrates bilaterally, probable pneumonia   Electronically Signed   By: Marlan Palau M.D.   On: 11/18/2013 10:40    Other results: EKG Date: 11/18/2013  Rate: 130  Rhythm: sinus tachycardia  QRS Axis: normal  Intervals: normal  ST/T Wave abnormalities: normal  Conduction Disutrbances:none  Narrative Interpretation:  Old EKG Reviewed: Rate has increased since last racing otherwise no significant change   Assessment: 53 year old [place] female with past medical history of fibromyalgia, seizure disorder, and migraines with recent multiple admissions for respiratory distress who presents with acute onset chest pain and found to have sepsis with new infiltrates on chest xray.    Plan:  Sepsis due to recurrent HCAP vs underlying autoimmune/ILD  - Pt presented with pleuritic CP, worsening dyspnea requiring continuous O2 requirement, and productive cough for the past 2 days. Pt with SIRS criteria of tachypnea (27), tachycardia (128), and leukocytosis with neutrophilia (in setting of corticosteroids) with increased (3.5 to 4 L) oxygen requirement and low oxygen saturation (87%) on admission. Chest xray revealed new b/l upper lobe infiltrates and diffuse rales on exam.  Lactic acid levels were normal. Due to pt's complicated history, etiology possibly due to autoimmune disease (sarcoid vs Wegener's granulomatosis ) vs ILD.  Pt is a former smoker with FH of SLE in grandmother.  -Oxygen therapy to keep SpO2> 92 % (pt at home on 3.5L O2 PRN) -Awaiting blood cultures x 2  -Continue IV vancomycin and cefepime for HCAP coverage  -PPCM will see pt  in AM (consider inpatient bronchoscopy) -Duoneb breathing treatment Q 6 hr    -Consider resuming prednisone taper   Pleuritic Chest Pain - Pt with similar complaint at prior hospitalization. Pt with no history of CAD. Pt with evidence of tenderness to palpation on exam suggestive of possible costochondritis.  D-dimer was elevated (2.23) however recent CTA chest on 2/15. Pro-BNP was elevated at 585.4 but lower than previous admission (3533).  Initial troponin was negative and no ischemic changes on 12-lead EKG.  -Cycle troponins  -IV morphine Q 4 hr pain -Continue home pain medications  -Obtain repeat 12-lead EKG in AM -Monitor on telemetry  C difficile Infection - Pt with + c diff toxin on last hospitalization and began on treatment after discharge (2/19 for 10 days) .  -Continue PO metronidazole 500 mg TID -Continue probiotic 250 mg BID -Contact isolation  Asymptomatic UTI -  Pt with moderate leukocytes, pyuria and few bacteruria. Pt with no urinary complaints.  -Awaiting urine cultures -No indication for antibiotics  Seizure Disorder - Pt with no recent seizure activity -Continue keppra 500 mg BID  Fibromyalgia - Pt with diffuse body aches and pain unchanged from baseline.  -Continue home tramadol 50 mg BID  PRN pain -Continue home etodolac 500 mg BID  -Continue home fentanyl 50 mcg patch Q 72 hrs  Chronic Normocytic Anemia - Pt with Hg of 11.2 on admission, above baseline of 10.  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 Pt with no recent active bleeding or hemodynamic instability.   -Continue to monitor  -Monitor for bleeding -Transfuse if Hg<7 -Continue folate 1 mg daily  -Continue B12 supplements 2000 mcg daily  Vitamin D Defienency - Pt with recent 25-OH Vitamin D of 13.  -Continue vitamin D 50,000 units every 7 days, last dose was 11/01/13?  Hypoalbuminemia - Pt with chronic low albumin levels. Etiology most likley due to chronic protein  malnutrition vs falsely low in setting of acute infection (acute phase reactant).  -Nutrition consult  GERD - currently without reflex symptoms  -Continue home pantoprazole 40 mg BID   Diet: Regular DVT Ppx: SQ Heparin TID Code: Full  Dispo: Disposition is deferred at this time, awaiting improvement of current medical problems.      The patient does have a current PCP Rocco Serene, MD) and does need an [facility] hospital follow-up appointment after discharge.  The patient does not have transportation limitations that hinder transportation to clinic appointments.  Signed: Otis Brace, MD 11/18/2013, 4:49 PM

## 2013-11-18 NOTE — ED Notes (Signed)
Results of lactic acid called to primary nurse Chris 

## 2013-11-18 NOTE — Progress Notes (Signed)
Pt. Was complaining not feeling good breathing tachypneic RR-40 complained of short of breath BS diminished O2 sats. 100% at 2l nasal cannula. On call MD was paged , Dr. Andrey CampanileWilson aware will see pt., pulmonologist MD doing rounds and was consulted with orders made. Will transfer pt. to stepdown.

## 2013-11-18 NOTE — Care Management Note (Addendum)
  Page 2 of 2   11/28/2013     12:09:18 PM   CARE MANAGEMENT NOTE 11/28/2013  Patient:  [name] [name],[name] [name]   Account Number:  [id number]  Date Initiated:  11/18/2013  Documentation initiated by:  AMERSON,JULIE  Subjective/Objective Assessment:   PT ADM ON 11/18/13 WITH [facility].  PTA, PT RESIDES AT HOME WITH FAMILY.     Action/Plan:   WILL FOLLOW FOR DC NEEDS AS PT PROGRESSES.   Anticipated DC Date:  11/25/2013   Anticipated DC Plan:  HOME W HOME HEALTH SERVICES      DC Planning Services  CM consult      Choice offered to / List presented to:             Status of service:  Completed, signed off Medicare Important Message given?   (If response is "NO", the following Medicare IM given date fields will be blank) Date Medicare IM given:   Date Additional Medicare IM given:    Discharge Disposition:  SKILLED NURSING FACILITY  Per UR Regulation:  Reviewed for med. necessity/level of care/duration of stay  If discussed at Long Length of Stay Meetings, dates discussed:   11/25/2013  11/27/2013    Comments:  11/28/2013 Disposition Plan:   d/c planned for today  (SW / Maree KrabbeLindsay Puritz [phone] notified) SN[facility] * with LOG Transportation:  [facility] 1-1:30[facility] today. ADD: today Donato Schultzrystal Arthea Nobel RN, BSN, MSHL, CCM 11/28/2013  11-27-13 [address] Brenda Graves[place], [place] [phone] CM discussed pt in the [facility] meeting. Plan to Continue Prednisone 40mg  PO daily. CM did contact MD to see when d/c was possible. MD stated possible d/c 11-28-13 awaiting biopsy results. Plan will be for SNF when stable.     11/21/13 1615 Henrietta Mayo RN MSN BSN CCM Xferred to 2S s/p (L) VATS, biopsies on BiPAP.  11/21/13- 1130- Donn PieriniKristi Webster RN BSN [phone] Pt for OR today for VATS- will continue to follow post op for any d/c needs

## 2013-11-18 NOTE — Progress Notes (Signed)
PO pain meds ordered at this time; pt requesting IV Morphine; MD paged at this time; will await callback.
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
## 2013-11-18 NOTE — Progress Notes (Signed)
Spoke with MD; team to see pt soon; will cont. To monitor.

## 2013-11-18 NOTE — ED Provider Notes (Signed)
CSN: [id number]     Arrival date & time 11/18/13  0909 History   First MD Initiated Contact with Patient 11/18/13 [phone]     Chief Complaint  Patient presents with  . Chest Pain     (Consider location/radiation/quality/duration/timing/severity/associated sxs/prior Treatment) HPI Comments: Patient is a 53 year old female with history of fibromyalgia, chronic pain, seizures, migraines, pneumonia who presents with sudden onset of chest pain and shortness of breath. She reports that this began last night and has gradually worsened since that time. She describes the chest pain as a squeezing, tightness pain. It is pleuritic in nature. She has recent hospital admission for similar symptoms. She was discharged on 2/19. She reports that she has been having a cough with green, productive sputum since that time. She states that the cough has not changed. She reports that she was admitted to the ICU. She feels subjectively febrile. She admits to bilateral lower leg pain and swelling. She has pain all over her body due to her fibromyalgia. She denies any current blood thinners.  The history is provided by the patient. No language interpreter was used.    Past Medical History  Diagnosis Date  . Fibromyalgia   . Chronic pain   . Seizures   . Migraine   . Pneumonia    History reviewed. No pertinent past surgical history. History reviewed. No pertinent family history. History  Substance Use Topics  . Smoking status: Never Smoker   . Smokeless tobacco: Never Used  . Alcohol Use: No   OB History   Grav Para Term Preterm Abortions TAB SAB Ect Mult Living                 Review of Systems  Constitutional: Positive for fever.  Respiratory: Positive for cough, chest tightness and shortness of breath.   Cardiovascular: Positive for chest pain and leg swelling.  Gastrointestinal: Negative for nausea, vomiting and abdominal pain.  Musculoskeletal: Positive for myalgias.  All other systems reviewed and  are negative.      Allergies  Shellfish allergy  Home Medications   Current Outpatient Rx  Name  Route  Sig  Dispense  Refill  . Cyanocobalamin (B-12) 2000 MCG TABS   Oral   Take 2,000 mcg by mouth daily.   30 tablet   12   . etodolac (LODINE) 500 MG tablet   Oral   Take 500 mg by mouth 2 (two) times daily.          . fentaNYL (DURAGESIC - DOSED MCG/HR) 50 MCG/HR   Transdermal   Place 1 patch (50 mcg total) onto the skin every 3 (three) days.   10 patch   0   . folic acid (FOLVITE) 1 MG tablet      Take 1 tablet daily.   30 tablet   0   . levETIRAcetam (KEPPRA) 500 MG tablet   Oral   Take 1 tablet (500 mg total) by mouth 2 (two) times daily.   60 tablet   2   . metroNIDAZOLE (FLAGYL) 500 MG tablet   Oral   Take 1 tablet (500 mg total) by mouth 3 (three) times daily.   30 tablet   0   . pantoprazole (PROTONIX) 40 MG tablet   Oral   Take 1 tablet (40 mg total) by mouth 2 (two) times daily.   30 tablet   0   . polyethylene glycol (MIRALAX / GLYCOLAX) packet   Oral   Take 17 g by mouth  daily as needed for mild constipation or moderate constipation.   14 each   0   . potassium chloride (K-DUR) 10 MEQ tablet   Oral   Take 1 tablet (10 mEq total) by mouth daily.   30 tablet   0   . predniSONE (DELTASONE) 10 MG tablet      3 tabs po daily x 7 days then 2 tabs po daily x 7 days then 1 tab po daily x 7 days then STOP   42 tablet   0   . pregabalin (LYRICA) 300 MG capsule   Oral   Take 1 capsule (300 mg total) by mouth 2 (two) times daily.   60 capsule   0   . rizatriptan (MAXALT) 10 MG tablet   Oral   Take 10 mg by mouth daily as needed for migraine. May repeat in 2 hours if needed         . saccharomyces boulardii (FLORASTOR) 250 MG capsule   Oral   Take 1 capsule (250 mg total) by mouth 2 (two) times daily.   60 capsule   2   . traMADol (ULTRAM) 50 MG tablet   Oral   Take 1 tablet (50 mg total) by mouth every 12 (twelve) hours as  needed for moderate pain.   60 tablet   0   . Vitamin D, Ergocalciferol, (DRISDOL) 50000 UNITS CAPS capsule   Oral   Take 50,000 Units by mouth every 7 (seven) days. Take on Saturday          BP 157/108  Pulse 102  Temp(Src) 100 F (37.8 C) (Rectal)  Resp 24  Ht 5' 1.02" (1.55 m)  Wt 124 lb 1.9 oz (56.3 kg)  BMI 23.43 kg/m2  SpO2 95% Physical Exam  Nursing note and vitals reviewed. Constitutional: She is oriented to person, place, and time. She appears well-developed and well-nourished. She appears distressed.  HENT:  Head: Normocephalic and atraumatic.  Right Ear: External ear normal.  Left Ear: External ear normal.  Nose: Nose normal.  Mouth/Throat: Uvula is midline and oropharynx is clear and moist.  Eyes: Conjunctivae are normal.  Neck: Normal range of motion.  No nuchal rigidity or meningeal signs  Cardiovascular: Regular rhythm, normal heart sounds, intact distal pulses and normal pulses.  Tachycardia present.   Tenderness to palpation diffusely over legs bilaterally. No swelling or edema seen.  Pulmonary/Chest: No stridor. Tachypnea noted. No respiratory distress. She has wheezes. She has rales.  Abdominal: Soft. She exhibits no distension.  Musculoskeletal: Normal range of motion.  Neurological: She is alert and oriented to person, place, and time. She has normal strength.  Skin: Skin is warm and dry. She is not diaphoretic. No erythema.  Psychiatric: She has a normal mood and affect. Her behavior is normal.    ED Course  Procedures (including critical care time) Labs Review Labs Reviewed  CBC WITH DIFFERENTIAL - Abnormal; Notable for the following:    WBC 31.2 (*)    Hemoglobin 11.2 (*)    HCT 35.6 (*)    RDW 18.6 (*)    Neutrophils Relative % 91 (*)    Lymphocytes Relative 3 (*)    Neutro Abs 28.4 (*)    Monocytes Absolute 1.6 (*)    All other components within normal limits  PRO B NATRIURETIC PEPTIDE - Abnormal; Notable for the following:    Pro B  Natriuretic peptide (BNP) 585.4 (*)    All other components within normal limits  D-DIMER,  QUANTITATIVE - Abnormal; Notable for the following:    D-Dimer, Quant 2.23 (*)    All other components within normal limits  COMPREHENSIVE METABOLIC PANEL - Abnormal; Notable for the following:    Albumin 2.5 (*)    Alkaline Phosphatase 119 (*)    All other components within normal limits  URINALYSIS, ROUTINE W REFLEX MICROSCOPIC - Abnormal; Notable for the following:    APPearance HAZY (*)    Bilirubin Urine MODERATE (*)    Leukocytes, UA MODERATE (*)    All other components within normal limits  URINE MICROSCOPIC-ADD ON - Abnormal; Notable for the following:    Squamous Epithelial / LPF FEW (*)    Bacteria, UA FEW (*)    All other components within normal limits  CULTURE, BLOOD (ROUTINE X 2)  CULTURE, BLOOD (ROUTINE X 2)  URINE CULTURE  I-STAT TROPOININ, ED  I-STAT CG4 LACTIC ACID, ED   Imaging Review Dg Chest 2 View  11/18/2013   CLINICAL DATA:  Chest pain and short of breath  EXAM: CHEST  2 VIEW  COMPARISON:  11/05/2013  FINDINGS: Interval development of upper lobe infiltrates bilaterally. Lung bases are clear. No pleural effusion. Negative for heart failure. Heart size is normal.  IMPRESSION: Interval development of upper lobe infiltrates bilaterally, probable pneumonia   Electronically Signed   By: Marlan Palau M.D.   On: 11/18/2013 10:40     EKG Interpretation None      MDM   Final diagnoses:  HCAP (healthcare-associated pneumonia)    Patient presents to ED with tachycardia and hypoxia. Recent discharge from the hospital for HCAP. Patient with diffuse rales on anterior and posterior chest wall. CXR shows upper lobe infiltrates bilaterally. WBC elevated at 31.2, consistent with last presentation to ED. Patient meets sepsis criteria. Maxipime and vancomycin were started in ED. D dimer done at time of presentation due to hypoxia and tachycardia. D dimer is positive. I discussed this  with the admitting physician. At this point I will no order a new CTA of chest given negative study during last admission. Admitted to medicine for further management. Admission is appreciated. Vital signs stable at time of transfer. Dr. Ethelda Chick evaluated patient and agrees with plan. Patient / Family / Caregiver informed of clinical course, understand medical decision-making process, and agree with plan.    Mora Bellman, PA-C 11/18/13 [phone]

## 2013-11-18 NOTE — Progress Notes (Signed)
MD paged to make aware of pt's arrival to 2w3; will await callback.

## 2013-11-19 ENCOUNTER — Inpatient Hospital Stay (HOSPITAL_COMMUNITY): Payer: Medicaid Other

## 2013-11-19 DIAGNOSIS — I5030 Unspecified diastolic (congestive) heart failure: Secondary | ICD-10-CM

## 2013-11-19 DIAGNOSIS — R0602 Shortness of breath: Secondary | ICD-10-CM

## 2013-11-19 DIAGNOSIS — J8410 Pulmonary fibrosis, unspecified: Secondary | ICD-10-CM

## 2013-11-19 DIAGNOSIS — E41 Nutritional marasmus: Secondary | ICD-10-CM

## 2013-11-19 DIAGNOSIS — I509 Heart failure, unspecified: Secondary | ICD-10-CM

## 2013-11-19 DIAGNOSIS — E43 Unspecified severe protein-calorie malnutrition: Secondary | ICD-10-CM | POA: Diagnosis present

## 2013-11-19 LAB — BASIC METABOLIC PANEL
BUN: 7 mg/dL (ref 6–23)
CALCIUM: 9.5 mg/dL (ref 8.4–10.5)
CHLORIDE: 98 meq/L (ref 96–112)
CO2: 26 meq/L (ref 19–32)
CREATININE: 0.47 mg/dL — AB (ref 0.50–1.10)
GFR calc Af Amer: >90 mL/min (ref >90)
GFR calc non Af Amer: >90 mL/min (ref >90)
Glucose, Bld: 138 mg/dL — ABNORMAL HIGH (ref 70–99)
Potassium: 3.5 mEq/L — ABNORMAL LOW (ref 3.7–5.3)
SODIUM: 141 meq/L (ref 137–147)

## 2013-11-19 LAB — CBC
HEMATOCRIT: 36.4 % (ref 36.0–46.0)
Hemoglobin: 11.5 g/dL — ABNORMAL LOW (ref 12.0–15.0)
MCH: 27.7 pg (ref 26.0–34.0)
MCHC: 31.6 g/dL (ref 30.0–36.0)
MCV: 87.7 fL (ref 78.0–100.0)
Platelets: 340 10*3/uL (ref 150–400)
RBC: 4.15 MIL/uL (ref 3.87–5.11)
RDW: 18.3 % — ABNORMAL HIGH (ref 11.5–15.5)
WBC: 14.9 10*3/uL — AB (ref 4.0–10.5)

## 2013-11-19 MED ORDER — FUROSEMIDE 10 MG/ML IJ SOLN
40.0000 mg | Freq: Once | INTRAMUSCULAR | Status: AC
  Administered 2013-11-19: 40 mg via INTRAVENOUS
  Filled 2013-11-19: qty 4

## 2013-11-19 MED ORDER — POTASSIUM CHLORIDE 20 MEQ/15ML (10%) PO LIQD
40.0000 meq | Freq: Once | ORAL | Status: DC
  Filled 2013-11-19: qty 30

## 2013-11-19 MED ORDER — HEPARIN SODIUM (PORCINE) 5000 UNIT/ML IJ SOLN
5000.0000 [IU] | Freq: Three times a day (TID) | INTRAMUSCULAR | Status: DC
  Administered 2013-11-19 – 2013-11-20 (×4): 5000 [IU] via SUBCUTANEOUS
  Filled 2013-11-19 (×6): qty 1

## 2013-11-19 MED ORDER — POTASSIUM CHLORIDE CRYS ER 20 MEQ PO TBCR: 40.0000 meq | EXTENDED_RELEASE_TABLET | Freq: Once | ORAL | Status: DC

## 2013-11-19 MED ORDER — PREGABALIN 100 MG PO CAPS
300.0000 mg | ORAL_CAPSULE | Freq: Two times a day (BID) | ORAL | Status: DC
  Administered 2013-11-20 – 2013-11-28 (×17): 300 mg via ORAL
  Filled 2013-11-19 (×2): qty 3
  Filled 2013-11-19: qty 12
  Filled 2013-11-19 (×14): qty 3

## 2013-11-19 MED ORDER — LORAZEPAM 2 MG/ML IJ SOLN
1.0000 mg | Freq: Once | INTRAMUSCULAR | Status: AC
  Administered 2013-11-19: 1 mg via INTRAVENOUS
  Filled 2013-11-19: qty 1

## 2013-11-19 NOTE — Progress Notes (Signed)
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 Just discharged on 2/19 w/ recommendations for steroid taper w/ suggestion that she would need Open lung bx should this re-occur. At time of d/c her CXR was essentially clear. She was re-admitted on 3/3 w/ CC: 1 d/ h/o chest pain and progressive dyspnea. CXR w/ reoccurrence of bilateral pulmonary infiltrates. PCCM was asked to eval.    SIGNIFICANT EVENTS / STUDIES:  10/14/13 - ADMIT  1/281/28 Abd US >>No hydronephrosis, Left kidney 8mm stone?, Right kidney 6mm stone? Mild intra and extrahepatic biliary ductal prominence,  1/28 CTA chest >>> No PE. Bilateral prominently upper lobe mixed airspace and ground-glass opacities, multi lobar pneumonia is favored. Mediastinal and bilateral hilar adenopathy  1/28 Bronchoscopy >>> unimpressive secretions, no DAH, no hsv lesions, no mass lesions, BAL sent.  1/28 TTE >>> EF 65-70%, grade 1 DD, PA peak 38, no change from prior  10/15/13 BRONCH BAL - 93% POLYS (Ddx is acute infection, AIP, aspiration)  1/28 CT head >>> Negative for acute process.  1/29 EEG >>> no evidence of seizure activity  1/31 2. 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 Rest negative. Resp VIrus PCR and culture - negative  2/4 off precedex overnight, improved orientation  TTE >>> EF 65%, grade 1 systolic dysfunction  10/26/13 - DC on 8 day pred taper. Did NOT go Home on oxygen  11/02/13 - readmitted for dysnea, BNP 3000. Diuresed  ...................Marland Kitchen.  READMIT 11/02/2013  2/15 Chest CT >>> No pulmonary  embolism, persistent upper lobe predominant bilateral airspace and ground-glass opacities, improved relative to most recent CT from 1/28, mediastinal and bilateral adenopathy is above  READMIT 3/3 CT chest 3/3>>>  LINES / TUBES:   CULTURES: Blood 3/03 >>   ANTIBIOTICS: Cefepime 3/3>>> vanc 3/3>>> Flagyl 3/3>>>  SUBJECTIVE:  Feels fatigued.  C/o dyspnea and abdominal discomfort.  VITAL SIGNS: Temp:  [97.9 F (36.6 C)-100 F (37.8 C)] 99 F (37.2 C) (03/04 0807) Pulse Rate:  [97-128] 102 (03/04 0345) Resp:  [16-40] 23 (03/04 0345) BP: (124-161)/(82-108) 140/89 mmHg (03/04 0345) SpO2:  [87 %-100 %] 100 % (03/04 0345) Weight:  [124 lb 1.9 oz (56.3 kg)] 124 lb 1.9 oz (56.3 kg) (03/03 1000)  PHYSICAL EXAMINATION: General:  Ill appearing Neuro: normal strength HEENT: no sinus tenderness Cardiovascular: regular, tachycardic Lungs:  Dry crackles bases Abdomen:  Soft, mild tenderness Musculoskeletal: 1+ edema Skin: no rashes  CBC Recent Labs     11/18/13  0945  11/19/13  0250  WBC  31.2*  14.9*  HGB  11.2*  11.5*  HCT  35.6*  36.4  PLT  366  340    Coag's Recent Labs     11/18/13  1657  INR  1.43    BMET Recent Labs     11/18/13  0945  11/19/13  0250  NA  141  141  K  4.4  3.5*  CL  97  98  CO2  32  26  BUN  8  7  CREATININE  0.55  0.47*  GLUCOSE  94  138*    Electrolytes Recent Labs     11/18/13  0945  11/19/13  0250  CALCIUM  9.5  9.5   Liver Enzymes Recent Labs     11/18/13  0945  AST  16  ALT  7  ALKPHOS  119*  BILITOT  0.5  ALBUMIN  2.5*    Cardiac Enzymes Recent Labs     11/18/13  0945  11/18/13  1659  11/18/13  2300  TROPONINI   --   <0.30  <0.30  PROBNP  585.4*   --    --     Glucose No results found for this basename: GLUCAP,  in the last 72 hours  Imaging Dg Chest 2 View  11/18/2013   CLINICAL DATA:  Chest pain and short of breath  EXAM: CHEST  2 VIEW  COMPARISON:  11/05/2013  FINDINGS: Interval development of  upper lobe infiltrates bilaterally. Lung bases are clear. No pleural effusion. Negative for heart failure. Heart size is normal.  IMPRESSION: Interval development of upper lobe infiltrates bilaterally, probable pneumonia   Electronically Signed   By: Marlan Palau M.D.   On: 11/18/2013 10:40       ASSESSMENT / PLAN:  Recurrent pneumonitis w/ associated CP of unclear etiology. Has had extensive eval during last 2 stays. What we know is that it has been steroid responsive in the past.  P: -continue solumedrol -f/u CT chest -Abx per primary team -will likely need thoracic surgery evaluation for VATS lung biopsy  Coralyn Helling, MD [facility] Pulmonary/Critical Care 11/19/2013, 8:54 AM Pager:  [phone] After 3pm call: [phone]

## 2013-11-19 NOTE — Progress Notes (Signed)
S:  Paged at approx. 11:45PM - patient with increased work of breathing.  Tachypneic to 40 with dyspnea.  SpO2 100% on 4L.  PCCM physicians had been consulted at admission and were in the process of evaluating the patient. O:  Vitals reviewed - SpO2 100% on 4L via [place] General: sitting up in bed in NAD Cardiac:  RRR, chest tender to palpation Lungs:  Decreased BS B/L A/P:  53 year old with recurrent pneumonitis of unknown etiology.  Steroid responsive in the past.  PCCM following and recommendations appreciated - transfer to SDU - IV Solu-Medrol - IV Lasix - CT chest w/o contrast - consult to CT Surgery in the AM for open lung biopsy  Evelena PeatAlex Ladarious Kresse DO

## 2013-11-19 NOTE — Consult Note (Signed)
[address]       [place] [address]             [phone]          [facility] Medical Record #[id number] Date of Birth: [birth year]  Referring: Dr Craige Cotta Primary Care: Rocco Serene, MD    Chief Complaint:    Chief Complaint  Patient presents with  . Chest Pain    History of Present Illness:     The patient is a 53 year old [place] female (who moved to the [place] about 3 months ago to be with family) with a history of recurrent pneumonia requiring multiple admissions recently (1/15-23, 1/27-2/10, 2/15-19), including an ICU stay for septic shock and acute respiratory failure requiring intubation.  Chest x-rays and CT of the chest showed patchy airspace opacities and bilateral pulmonary infiltrates with bilateral mediastinal adenopathy.  Bronchoalveolar lavage was negative for pneumocystis, AFB, fungus, and group A strep, with negative respiratory virus panel. Rapid flu and HIV were negative and blood cultures showed no growth. She was treated with prolonged courses of antibiotics and developed a C.diff infection, which is currently being treated with Flagyl.  Pulmonary/Critical Care and Infectious Disease services have evaluated the patient during previous admissions. Multiple tests have been performed, and no specific source for her recurrent infections has been identified.  At the time of her most recent discharge, her chest x-ray showed near resolution of her bilateral infiltrates. She was treated with a long term course of steroids and was discharged on home oxygen.  It was recommended that she follow up as an outpatient with a repeat CT scan and be considered for open lung biopsy if her CT scan had not shown improvement.   She presented to the ER at [facility] on the date of this admission complaining of acute onset left sided chest pain with shortness of breath. She also reported associated palpitations, cough productive of green  sputum, and diaphoresis, but no fevers. Repeat CT shows continued opacity in the upper lobes, with progression of septal and peribronchial thickening bilaterally. Thoracic surgery is now being consulted for consideration of VATS/biopsy.  The patient continues to complain of dyspnea, with increased O2 requirements, chest discomfort, anorexia, fatigue.  She continues to have loose stools and abdominal discomfort, although this is slowly improving. She apparently had been on intermittent O2 while in [place] but does not know why. Per her brother and RN, her condition seems to be worsening. History is difficult to obtain from the patient, as she is dyspneic and at times refuses to answer questions.    Current Activity/ Functional Status: Patient was independent with mobility/ambulation, transfers, ADL's, IADL's prior to the onset of her illness in January.  Per her brother, she walked regularly and was active prior to her move here to the [place].    Past Medical History: Past Medical History  Diagnosis Date  . Fibromyalgia   . Chronic pain   . Seizures - none reported in many years   . Migraine    Nephrolithiasis    Treated during last admission for genital herpes    Anemia of chronic disease    . Pneumonia     Past Surgical History: History reviewed. No pertinent past surgical history.    Social History: History   Social History  . Marital Status: Single    Spouse Name: N/A    Number of Children: N/A  . Years of  Education: N/A   Occupational History  . Not on file.   Social History Main Topics  . Smoking status: Previously smoked, quit 8-9 years ago  . Smokeless tobacco: Never Used  . Alcohol Use: No  . Drug Use: No  . Sexual Activity: No   Other Topics Concern  . Not on file   Social History Narrative  . No narrative on file   Pt's brother reports that she previously smoked, but quit 8-9 years ago.  He is unsure how much she smoked.  The patient does not respond  when asked about her smoking, just nods and shrugs at her brother's response.    Allergies: Allergies  Allergen Reactions  . Shellfish Allergy Anaphylaxis     Medications: Current Facility-Administered Medications  Medication Dose Route Frequency Provider Last Rate Last Dose  . ceFEPIme (MAXIPIME) 1 g in dextrose 5 % 50 mL IVPB  1 g Intravenous Q8H Thuy [place], RPH   1 g at 11/19/13 0351  . etodolac (LODINE) capsule 500 mg  500 mg Oral BID Jonah Blue, DO   500 mg at 11/19/13 1201  . fentaNYL (DURAGESIC - dosed mcg/hr) 50 mcg  50 mcg Transdermal Q72H Ky Barban, MD   50 mcg at 11/18/13 1603  . folic acid (FOLVITE) tablet 1 mg  1 mg Oral Daily Marjan Rabbani, MD   1 mg at 11/19/13 1202  . ipratropium-albuterol (DUONEB) 0.5-2.5 (3) MG/3ML nebulizer solution 3 mL  3 mL Nebulization TID Jonah Blue, DO      . levETIRAcetam (KEPPRA) tablet 500 mg  500 mg Oral BID Ky Barban, MD   500 mg at 11/19/13 1202  . methylPREDNISolone sodium succinate (SOLU-MEDROL) 125 mg/2 mL injection 80 mg  80 mg Intravenous 3 times per day Simonne Martinet, NP   80 mg at 11/19/13 0654  . metroNIDAZOLE (FLAGYL) tablet 500 mg  500 mg Oral TID Ky Barban, MD   500 mg at 11/19/13 1202  . morphine 2 MG/ML injection 1 mg  1 mg Intravenous Q4H PRN Ky Barban, MD   1 mg at 11/19/13 0412  . ondansetron (ZOFRAN) tablet 4 mg  4 mg Oral Q6H PRN Ky Barban, MD       Or  . ondansetron (ZOFRAN) injection 4 mg  4 mg Intravenous Q6H PRN Ky Barban, MD      . pantoprazole (PROTONIX) EC tablet 40 mg  40 mg Oral BID Ky Barban, MD   40 mg at 11/19/13 1202  . polyethylene glycol (MIRALAX / GLYCOLAX) packet 17 g  17 g Oral Daily PRN Ky Barban, MD      . saccharomyces boulardii (FLORASTOR) capsule 250 mg  250 mg Oral BID Ky Barban, MD   250 mg at 11/19/13 1201  . sodium chloride 0.9 % injection 3 mL  3 mL Intravenous Q12H Ky Barban, MD    3 mL at 11/19/13 1202  . SUMAtriptan (IMITREX) tablet 50 mg  50 mg Oral Q2H PRN Ky Barban, MD      . traMADol Janean Sark) tablet 50 mg  50 mg Oral Q12H PRN Ky Barban, MD      . vancomycin (VANCOCIN) IVPB 750 mg/150 ml premix  750 mg Intravenous Q12H Thuy Dien Dang, RPH   750 mg at 11/19/13 0034  . vitamin B-12 (CYANOCOBALAMIN) tablet 2,000 mcg  2,000 mcg Oral Daily Jonah Blue, DO   2,000 mcg at 11/19/13 1202  Prescriptions prior to admission  Medication Sig Dispense Refill  . Cyanocobalamin (B-12) 2000 MCG TABS Take 2,000 mcg by mouth daily.  30 tablet  12  . etodolac (LODINE) 500 MG tablet Take 500 mg by mouth 2 (two) times daily.       . fentaNYL (DURAGESIC - DOSED MCG/HR) 50 MCG/HR Place 1 patch (50 mcg total) onto the skin every 3 (three) days.  10 patch  0  . folic acid (FOLVITE) 1 MG tablet Take 1 tablet daily.  30 tablet  0  . levETIRAcetam (KEPPRA) 500 MG tablet Take 1 tablet (500 mg total) by mouth 2 (two) times daily.  60 tablet  2  . metroNIDAZOLE (FLAGYL) 500 MG tablet Take 1 tablet (500 mg total) by mouth 3 (three) times daily.  30 tablet  0  . pantoprazole (PROTONIX) 40 MG tablet Take 1 tablet (40 mg total) by mouth 2 (two) times daily.  30 tablet  0  . polyethylene glycol (MIRALAX / GLYCOLAX) packet Take 17 g by mouth daily as needed for mild constipation or moderate constipation.  14 each  0  . potassium chloride (K-DUR) 10 MEQ tablet Take 1 tablet (10 mEq total) by mouth daily.  30 tablet  0  . predniSONE (DELTASONE) 10 MG tablet 3 tabs po daily x 7 days then 2 tabs po daily x 7 days then 1 tab po daily x 7 days then STOP  42 tablet  0  . pregabalin (LYRICA) 300 MG capsule Take 1 capsule (300 mg total) by mouth 2 (two) times daily.  60 capsule  0  . rizatriptan (MAXALT) 10 MG tablet Take 10 mg by mouth daily as needed for migraine. May repeat in 2 hours if needed      . saccharomyces boulardii (FLORASTOR) 250 MG capsule Take 1 capsule (250 mg total) by  mouth 2 (two) times daily.  60 capsule  2  . traMADol (ULTRAM) 50 MG tablet Take 1 tablet (50 mg total) by mouth every 12 (twelve) hours as needed for moderate pain.  60 tablet  0  . Vitamin D, Ergocalciferol, (DRISDOL) 50000 UNITS CAPS capsule Take 50,000 Units by mouth every 7 (seven) days. Take on Saturday          Family History: History reviewed. No pertinent family history.      Review of Systems:     Cardiac Review of Systems: Y or N  Chest Pain [  x  ]  Resting SOB [  x ] Exertional SOB  [ x ]  Orthopnea [  ]   Pedal Edema [   ]    Palpitations [  ] Syncope  [  ]   Presyncope [   ]  General Review of Systems:  Constitional: recent weight change [  ]; anorexia [ x ]; fatigue [x ]; nausea [  ]; night sweats [  ]; fever [  ]; or chills [  ];  Dental: poor dentition[  ];   Eye : blurred vision [  ]; diplopia [   ]; vision changes [  ];  Amaurosis fugax[  ]; Resp: cough [ x ];  wheezing[  ];  hemoptysis[  ]; shortness of breath[ x ]; paroxysmal nocturnal dyspnea[  ]; dyspnea on exertion[ x ]; or orthopnea[  ];  GI:  gallstones[  ], vomiting[  ];  dysphagia[  ]; melena[  ];  hematochezia [  ]; heartburn[  ];   Hx of  Colonoscopy[  ]; Generalized abdominal discomfort, currently being treated for C.diff infection, some loose stools  GU: kidney stones [  ]; hematuria[  ];   dysuria [  ];  nocturia[  ];  history of     obstruction [  ];             Skin: rash, swelling[  ];, hair loss[  ];  peripheral edema[  ];  or itching[  ]; Musculosketetal: myalgias[ x ];  joint swelling[  ];  joint erythema[  ];  joint pain[  ];  back pain[  ];  Heme/Lymph: bruising[  ];  bleeding[  ];  anemia[ x ];  Neuro: TIA[  ];  headaches[  ];  stroke[  ];  vertigo[  ];  seizures[ x ];   paresthesias[  ];  difficulty walking[  ];  Psych:depression[  ]; anxiety[  ];  Endocrine: diabetes[   ];  thyroid dysfunction[  ];  Immunizations: Flu [  ]; Pneumococcal[  ];   She reportedly had some confusion and delirium following extubation during a previous admission, but this resolved.     Physical Exam: BP 144/83  Pulse 102  Temp(Src) 99 F (37.2 C) (Oral)  Resp 23  Ht 5' 1.02" (1.55 m)  Wt 124 lb 1.9 oz (56.3 kg)  BMI 23.43 kg/m2  SpO2 100%  General appearance: Thin Hispanic female, no acute respiratory distress, but some increased WOB, especially with talking or movement Neurologic: intact Heart: Tachycardic, 110s Lungs: Crackles in bases bilaterally Abdomen: Soft, diffusely tender, nondistended, +BS Extremities: Mild LE edema bilaterally, warm and well perfused     Diagnostic Studies & Laboratory data:     Recent Radiology Findings:   Dg Chest 2 View  11/18/2013   CLINICAL DATA:  Chest pain and short of breath  EXAM: CHEST  2 VIEW  COMPARISON:  11/05/2013  FINDINGS: Interval development of upper lobe infiltrates bilaterally. Lung bases are clear. No pleural effusion. Negative for heart failure. Heart size is normal.  IMPRESSION: Interval development of upper lobe infiltrates bilaterally, probable pneumonia   Electronically Signed   By: Marlan Palauharles  Clark M.D.   On: 11/18/2013 10:40   Ct Chest Wo Contrast  11/19/2013   CLINICAL DATA:  53 year old female with left side chest pain and shortness of Breath. Worsening upper lobe pulmonary opacity. Recurrent pneumonitis of unknown etiology. Initial encounter.  EXAM: CT CHEST WITHOUT CONTRAST  TECHNIQUE: Multidetector CT imaging of the chest was performed following the standard protocol without IV contrast.  COMPARISON:  Chest radiographs 11/18/2013 and earlier. Chest CTA 11/02/2013.  FINDINGS: Major airways remain patent. Since 11/02/2013, a general pattern of widespread bilateral geographic and centrilobular ground-glass opacity continues. Upper lobe predominance. Sparing of the immediate subpleural lung. Superimposed  peribronchovascular more confluent opacity and septal thickening has progressed, particularly in the left upper lung (series 3, image 13 today versus series 6, image 118 on the CTA) and probably is responsible for the radiographic progression.  No areas of bone a 5 consolidation. Occasional superimposed calcified granulomas are present in both lungs. No pleural or pericardial effusion.  Stable mediastinal nodes, most within normal limits, some maximal in size. Small but increased hilar nodes were better visualized on the prior study with contrast. There is a calcified right hilar node.  Negative non contrast thoracic inlet. No axillary lymphadenopathy. Negative visible non contrast liver, gallbladder, spleen, pancreas, adrenal glands, left kidney, and bowel. Multiple nonobstructing right intra renal upper pole calculi.  No acute osseous abnormality identified.  Osteopenia.  IMPRESSION: 1. Continued pattern of abnormal opacity in the lungs with upper lobe predominance and some sparing of the immediate subpleural lung. Recently progressed septal and peribronchovascular thickening. Of note, this process has waxed and waned (see 10/15/2013 CT). The pattern is nonspecific, but in absence of clinical signs of infection consider cryptogenic organizing pneumonia. 2. Occasional calcified granulomas and hilar nodes in the chest. 3. Right nephrolithiasis.   Electronically Signed   By: Augusto Gamble M.D.   On: 11/19/2013 10:18      Recent Lab Findings: Lab Results  Component Value Date   WBC 14.9* 11/19/2013   HGB 11.5* 11/19/2013   HCT 36.4 11/19/2013   PLT 340 11/19/2013   GLUCOSE 138* 11/19/2013   ALT 7 11/18/2013   AST 16 11/18/2013   NA 141 11/19/2013   K 3.5* 11/19/2013   CL 98 11/19/2013   CREATININE 0.47* 11/19/2013   BUN 7 11/19/2013   CO2 26 11/19/2013   TSH 0.226* 10/15/2013   INR 1.43 11/18/2013      Assessment / Plan:   The patient is a 53 year old Hispanic female with recurrent pneumonia and pneumonitis requiring  multiple recent hospitalizations.  CT of the chest shows persistent abnormal opacity in the upper lobes, with mediastinal adenopathy.  Multiple tests have been performed, with no obvious source found for her infections.  She continues to have dyspnea and respiratory insufficiency with high O2 requirements despite steroid therapy. Thoracic surgery has been consulted for consideration for diagnostic lung biopsy.   I have reviewed her films and recommended to patient with her brother helping her understand   That we proceed  bronchoscopy with left VATS/lung biopsies.  This can be  tentatively scheduled for Friday, 11/21/2013.  Delight Ovens MD      [address] [place] [place] [address] Office [phone]   Beeper [phone]

## 2013-11-19 NOTE — H&P (Signed)
INTERNAL MEDICINE TEACHING SERVICE Attending Admission Note  Date: 11/19/2013  Patient name: [name] [name]  Medical record number: [id number]  Date of birth: 06/08/1961    I have seen and evaluated [name] CaulNieves Gingrich and discussed their care with the Residency Team.  53 yr old female with hx seizure disorder, fibromyalgia, HCAP with recent admission requiring ICU care due to septic shock, presented with CP and SOB. She has had hx of pneumonitis that has responded to steroid therapy. She now returns with a similar presentation and a CXR with bilateral infiltrates. On exam, she seems improved from description overnight. Lung: Crackles bilat bases. CV: S1S2, tachy, no m/r/g.  Overnight, she was noted to have increased RR to 40 and required higher level of are. She has maintained O2 sat with 4 L [place] over 90%.  At this time, appreciate pulmonary input. She has recurrent pneumonitis. Continue steroid therapy. She needs a lung bx, but given her acute illness, this may have to wait until she is more stable. I will leave this decision to CTS. For now, continue solumedrol, cefepime, vancomycin.  Jonah BlueAlejandro Shandell Giovanni, DO, FACP [facility] 11/19/2013, 3:18 PM

## 2013-11-19 NOTE — Progress Notes (Signed)
Subjective:  Pt seen and examined in AM. Pt with increased WOB last night and transferred to SDU. Pt was given IV lasix and started on solumedrol. Pt much improved this AM. Pt reports she is better than on admission. Still with cough, chills, dyspnea, and chest wall tenderness. Also with increased urinary frequency after IV lasix last night and burning. No fever, nausea, vomiting, abdominal pain, or change in BM.     Objective: Vital signs in last 24 hours: Filed Vitals:   11/19/13 0800 11/19/13 0807 11/19/13 1200 11/19/13 1457  BP: 144/83     Pulse:      Temp: 99 F (37.2 C) 99 F (37.2 C) 98.2 F (36.8 C) 99.3 F (37.4 C)  TempSrc: Oral Oral Oral Oral  Resp:      Height:      Weight:      SpO2:       Weight change:   Intake/Output Summary (Last 24 hours) at 11/19/13 1633 Last data filed at 11/19/13 1432  Gross per 24 hour  Intake    223 ml  Output   2250 ml  Net  -2027 ml    Constitutional: She is oriented to person, place, and time. No distress. Ill-appearing  Head: Normocephalic and atraumatic.  Mouth/Throat: Oropharynx is clear and moist. No oropharyngeal exudate.  Eyes: EOMI  Neck: Normal range of motion. Neck supple.  Cardiovascular: Tacycardic Regular rhythm and normal heart sounds.  Pulmonary/Chest: She has rales at bases. Dyspnea with speaking full sentances  Abdominal: Soft. Bowel sounds are normal. She exhibits no distension. There is tenderness (epigastric). There is no rebound and no guarding.  Musculoskeletal: Normal range of motion. She exhibits edema (trace b/l) and tenderness (diffuse).  Neurological: She is alert and oriented to person, place, and time. No cranial nerve deficit.  Skin: Skin is warm and dry. No rash noted. She is not diaphoretic. No erythema. No pallor.  Psychiatric: She has a normal mood and affect. Her behavior is normal. Judgment and thought content normal.    Lab Results: Basic Metabolic Panel:  Recent Labs Lab  11/18/13 0945 11/19/13 0250  NA 141 141  K 4.4 3.5*  CL 97 98  CO2 32 26  GLUCOSE 94 138*  BUN 8 7  CREATININE 0.55 0.47*  CALCIUM 9.5 9.5   Liver Function Tests:  Recent Labs Lab 11/18/13 0945  AST 16  ALT 7  ALKPHOS 119*  BILITOT 0.5  PROT 7.9  ALBUMIN 2.5*   CBC:  Recent Labs Lab 11/18/13 0945 11/19/13 0250  WBC 31.2* 14.9*  NEUTROABS 28.4*  --   HGB 11.2* 11.5*  HCT 35.6* 36.4  MCV 89.0 87.7  PLT 366 340   Cardiac Enzymes:  Recent Labs Lab 11/18/13 1659 11/18/13 2300  TROPONINI <0.30 <0.30   BNP:  Recent Labs Lab 11/18/13 0945  PROBNP 585.4*   D-Dimer:  Recent Labs Lab 11/18/13 0945  DDIMER 2.23*    Coagulation:  Recent Labs Lab 11/18/13 1657  LABPROT 17.1*  INR 1.43    Urinalysis:  Recent Labs Lab 11/18/13 1219  COLORURINE YELLOW  LABSPEC 1.019  PHURINE 7.0  GLUCOSEU NEGATIVE  HGBUR NEGATIVE  BILIRUBINUR MODERATE*  KETONESUR NEGATIVE  PROTEINUR NEGATIVE  UROBILINOGEN 0.2  NITRITE NEGATIVE  LEUKOCYTESUR MODERATE*    Micro Results: Recent Results (from the past 240 hour(s))  CULTURE, BLOOD (ROUTINE X 2)     Status: None   Collection Time    11/18/13  9:45 AM  Result Value Ref Range Status   Specimen Description BLOOD LEFT ANTECUBITAL   Final   Special Requests BOTTLES DRAWN AEROBIC AND ANAEROBIC 10CC   Final   Culture  Setup Time     Final   Value: 11/18/2013 14:27     Performed at [facility]   Culture     Final   Value:        BLOOD CULTURE RECEIVED NO GROWTH TO DATE CULTURE WILL BE HELD FOR 5 DAYS BEFORE ISSUING A FINAL NEGATIVE REPORT     Performed at [facility]   Report Status PENDING   Incomplete  CULTURE, BLOOD (ROUTINE X 2)     Status: None   Collection Time    11/18/13 11:32 AM      Result Value Ref Range Status   Specimen Description BLOOD HAND RIGHT   Final   Special Requests BOTTLES DRAWN AEROBIC AND ANAEROBIC 5CC   Final   Culture  Setup Time     Final   Value:  11/18/2013 16:09     Performed at [facility]   Culture     Final   Value:        BLOOD CULTURE RECEIVED NO GROWTH TO DATE CULTURE WILL BE HELD FOR 5 DAYS BEFORE ISSUING A FINAL NEGATIVE REPORT     Performed at [facility]   Report Status PENDING   Incomplete  URINE CULTURE     Status: None   Collection Time    11/18/13 12:19 PM      Result Value Ref Range Status   Specimen Description URINE, CLEAN CATCH   Final   Special Requests NONE   Final   Culture  Setup Time     Final   Value: 11/18/2013 12:37     Performed at [facility]   Colony Count PENDING   Incomplete   Culture     Final   Value: Culture reincubated for better growth     Performed at [facility]   Report Status PENDING   Incomplete   Studies/Results: Dg Chest 2 View  11/18/2013   CLINICAL DATA:  Chest pain and short of breath  EXAM: CHEST  2 VIEW  COMPARISON:  11/05/2013  FINDINGS: Interval development of upper lobe infiltrates bilaterally. Lung bases are clear. No pleural effusion. Negative for heart failure. Heart size is normal.  IMPRESSION: Interval development of upper lobe infiltrates bilaterally, probable pneumonia   Electronically Signed   By: Marlan Palauharles  Clark M.D.   On: 11/18/2013 10:40   Ct Chest Wo Contrast  11/19/2013   CLINICAL DATA:  53 year old female with left side chest pain and shortness of Breath. Worsening upper lobe pulmonary opacity. Recurrent pneumonitis of unknown etiology. Initial encounter.  EXAM: CT CHEST WITHOUT CONTRAST  TECHNIQUE: Multidetector CT imaging of the chest was performed following the standard protocol without IV contrast.  COMPARISON:  Chest radiographs 11/18/2013 and earlier. Chest CTA 11/02/2013.  FINDINGS: Major airways remain patent. Since 11/02/2013, a general pattern of widespread bilateral geographic and centrilobular ground-glass opacity continues. Upper lobe predominance. Sparing of the immediate subpleural lung. Superimposed peribronchovascular  more confluent opacity and septal thickening has progressed, particularly in the left upper lung (series 3, image 13 today versus series 6, image 118 on the CTA) and probably is responsible for the radiographic progression.  No areas of bone a 5 consolidation. Occasional superimposed calcified granulomas are present in both lungs. No pleural or pericardial effusion.  Stable mediastinal  nodes, most within normal limits, some maximal in size. Small but increased hilar nodes were better visualized on the prior study with contrast. There is a calcified right hilar node.  Negative non contrast thoracic inlet. No axillary lymphadenopathy. Negative visible non contrast liver, gallbladder, spleen, pancreas, adrenal glands, left kidney, and bowel. Multiple nonobstructing right intra renal upper pole calculi.  No acute osseous abnormality identified.  Osteopenia.  IMPRESSION: 1. Continued pattern of abnormal opacity in the lungs with upper lobe predominance and some sparing of the immediate subpleural lung. Recently progressed septal and peribronchovascular thickening. Of note, this process has waxed and waned (see 10/15/2013 CT). The pattern is nonspecific, but in absence of clinical signs of infection consider cryptogenic organizing pneumonia. 2. Occasional calcified granulomas and hilar nodes in the chest. 3. Right nephrolithiasis.   Electronically Signed   By: Augusto Gamble M.D.   On: 11/19/2013 10:18   Medications: I have reviewed the patient's current medications. Scheduled Meds: . ceFEPime (MAXIPIME) IV  1 g Intravenous Q8H  . etodolac  500 mg Oral BID  . fentaNYL  50 mcg Transdermal Q72H  . folic acid  1 mg Oral Daily  . heparin subcutaneous  5,000 Units Subcutaneous 3 times per day  . ipratropium-albuterol  3 mL Nebulization TID  . levETIRAcetam  500 mg Oral BID  . methylPREDNISolone (SOLU-MEDROL) injection  80 mg Intravenous 3 times per day  . metroNIDAZOLE  500 mg Oral TID  . pantoprazole  40 mg Oral BID   . potassium chloride  40 mEq Oral Once  . saccharomyces boulardii  250 mg Oral BID  . sodium chloride  3 mL Intravenous Q12H  . vancomycin  750 mg Intravenous Q12H  . vitamin B-12  2,000 mcg Oral Daily   Continuous Infusions:  PRN Meds:.morphine injection, ondansetron (ZOFRAN) IV, ondansetron, polyethylene glycol, SUMAtriptan, traMADol Assessment/Plan:  Assessment: 53 year old [place] female with past medical history of fibromyalgia, seizure disorder, and migraines with recent multiple admissions for respiratory distress who presents with acute onset chest pain and found to have sepsis with new infiltrates on chest xray.   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 Chest xray revealed new b/l upper lobe infiltrates and diffuse rales on exam. Lactic acid levels were normal. Due to pt's complicated history, etiology possibly due to autoimmune disease (sarcoid) vs ILD vs COP. Pt is a former smoker with FH of SLE in grandmother.  -Oxygen therapy to keep SpO2> 92 % (pt at home on 3.5L O2 PRN)  -Awaiting blood cultures x 2 --> NGTD  -Continue IV vancomycin and cefepime Day 2 for HCAP coverage  -Duoneb breathing treatment Q 6 hr  -IV solumedrol 80 mg TID  -Monitor leukocytosis --> 31.2K to 14.9K -CT chest w/o cont --> Continued pattern of abnormal opacity in the lungs with upper lobe predominance and some sparing of the immediate subpleural lung. Recently progressed septal and peribronchovascular thickening -Appreciate pulmonology and CVTS recommendations --->  pt to have bronchoscopy with left VATS/lung biopsies on 3/6  Pleuritic Chest Pain - Pt with similar complaint at prior hospitalization. Pt with no history of CAD. Pt with evidence of tenderness to palpation on exam suggestive of possible costochondritis. D-dimer was elevated (2.23) however recent  CTA chest on 2/15 with no PE. Troponins were negative and no ischemic changes on 12-lead EKG.  -IV morphine Q 4 hr  pain  -Continue home pain medications  -Monitor on telemetry   History of Grade 1 Diastolic CHF - 2D-echo on 1/28 with EF 65-70% with grade 1 diastolic dysfunction (not present on 2/16 2D-echo) PT with elevated Pro-BNP 585.4 but lower than previous admission (3533). Pt received IV Lasix 40 mg on night of admission. -Monitor daily weights and volume status   C difficile Infection - Pt with + c diff toxin on last hospitalization and began on treatment after discharge (2/19 for 10 days).  -Continue PO metronidazole 500 mg TID Day 2 in hospital (unknown   -Continue probiotic 250 mg BID  -Contact isolation   Symptomatic UTI - Pt with moderate leukocytes, pyuria and few bacteruria. Pt today complains of burning on urination and frequency (received IV lasix yesterday).  Pt with evidence of right nephrolithiasis on recent CT imaging.  -Awaiting urine cultures  -On broad spectrum coverage with vancomycin and cefepime   Seizure Disorder - Pt with no recent seizure activity  -Continue keppra 500 mg BID   Fibromyalgia - Pt with diffuse body aches and pain unchanged from baseline.  -Continue home tramadol 50 mg BID PRN pain  -Continue home etodolac 500 mg BID  -Continue home fentanyl 50 mcg patch Q 72 hrs   Chronic Normocytic Anemia - Pt with Hg of 11.2 on admission, above baseline of 10. 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 Pt with no recent active bleeding or hemodynamic instability.  -Continue to monitor  -Monitor for bleeding  -Transfuse if Hg<7  -Continue folate 1 mg daily  -Continue B12 supplements 2000 mcg daily   Vitamin D Deficiency  - Pt with recent 25-OH Vitamin D of 13.  -Continue vitamin D 50,000 units every 7 days, last dose was 11/01/13?   Severe Malnutrition in setting of hypoalbuminemia - Pt with chronic low albumin levels.  Etiology most likley due to chronic protein malnutrition vs falsely low in setting of acute infection (acute phase reactant).  -Nutrition consult  -Ensure Complete BID  GERD - currently without reflex symptoms  -Continue home pantoprazole 40 mg BID   Diet: Regular  DVT Ppx: SQ Heparin TID  Code: Full   Dispo: Disposition is deferred at this time, awaiting improvement of current medical problems.   The patient does have a current PCP Rocco Serene, MD) and does need an [facility] hospital follow-up appointment after discharge.  The patient does not have transportation limitations that hinder transportation to clinic appointments.  .Services Needed at time of discharge: Y = Yes, Blank = No PT:   OT:   RN:   Equipment:   Other:     LOS: 1 day   Otis Brace, MD 11/19/2013, 4:33 PM

## 2013-11-19 NOTE — Progress Notes (Addendum)
INITIAL NUTRITION ASSESSMENT  DOCUMENTATION CODES Per approved criteria  -Severe malnutrition in the context of acute illness or injury   INTERVENTION: Ensure Complete po BID, each supplement provides 350 kcal and 13 grams of protein RD to follow for nutrition care plan  NUTRITION DIAGNOSIS: Predicted suboptimal intake related to poor appetite as evidenced by nutrition assessment history  Goal: Pt to meet >/= 90% of their estimated nutrition needs   Monitor:  PO & supplemental intake, weight, labs, I/O's  Reason for Assessment: Malnutrition Screening Tool Report  53 y.o. female  Admitting Dx: HCAP (healthcare-associated pneumonia)  ASSESSMENT: 53 year old female whom CCM has seen on several occassions for recurrent steroid responsive pneumonitis of uncertain etiology. Just discharged on 2/19 w/ recommendations for steroid taper w/ suggestion that she would need open lung bx should this re-occur. At time of d/c her CXR was essentially clear. She was re-admitted on 3/3 w/ CC:1 d/ h/o chest pain and progressive dyspnea. CXR w/ reoccurrence of bilateral pulmonary infiltrates.    RD unable to interview patient; sleeping soundly; patient recently admitted end of January 2015 with respiratory distress in setting of PNA -- was initiated on TF; pt with hx of poor PO intake since diet advancement (10/23/12); per weight readings, patient has had a 7% weight loss x 6 weeks (severe for time frame); no % PO intake records available; would benefit from addition of nutrition supplements -- RD to order.  RD unable to complete Nutrition Focused Physical Exam at this time.  Patient meets criteria for severe malnutrition in the context of acute illness or injury as evidenced by < 50% of estimated energy requirement for > 5 days and 7% weight loss in < 2 months.  Height: Ht Readings from Last 1 Encounters:  11/18/13 5' 1.02" (1.55 m)    Weight: Wt Readings from Last 1 Encounters:  11/18/13 124 lb  1.9 oz (56.3 kg)    Ideal Body Weight: 105 lb  % Ideal Body Weight: 118%  Wt Readings from Last 10 Encounters:  11/18/13 124 lb 1.9 oz (56.3 kg)  11/10/13 124 lb 3.2 oz (56.337 kg)  11/02/13 127 lb 13.9 oz (58 kg)  10/28/13 127 lb 13.9 oz (58 kg)  10/09/13 133 lb 9.6 oz (60.601 kg)    Usual Body Weight: 133 lb  % Usual Body Weight: 93%  BMI:  Body mass index is 23.43 kg/(m^2).  Estimated Nutritional Needs: Kcal: 1600-1800 Protein: 80-90 gm Fluid: 1.6-1.8 L  Skin: Intact  Diet Order: General  EDUCATION NEEDS: -No education needs identified at this time   Intake/Output Summary (Last 24 hours) at 11/19/13 1229 Last data filed at 11/19/13 1202  Gross per 24 hour  Intake    373 ml  Output   2050 ml  Net  -1677 ml    Labs:   Recent Labs Lab 11/18/13 0945 11/19/13 0250  NA 141 141  K 4.4 3.5*  CL 97 98  CO2 32 26  BUN 8 7  CREATININE 0.55 0.47*  CALCIUM 9.5 9.5  GLUCOSE 94 138*    Scheduled Meds: . ceFEPime (MAXIPIME) IV  1 g Intravenous Q8H  . etodolac  500 mg Oral BID  . fentaNYL  50 mcg Transdermal Q72H  . folic acid  1 mg Oral Daily  . ipratropium-albuterol  3 mL Nebulization TID  . levETIRAcetam  500 mg Oral BID  . methylPREDNISolone (SOLU-MEDROL) injection  80 mg Intravenous 3 times per day  . metroNIDAZOLE  500 mg Oral  TID  . pantoprazole  40 mg Oral BID  . saccharomyces boulardii  250 mg Oral BID  . sodium chloride  3 mL Intravenous Q12H  . vancomycin  750 mg Intravenous Q12H  . vitamin B-12  2,000 mcg Oral Daily    Continuous Infusions:   Past Medical History  Diagnosis Date  . Fibromyalgia   . Chronic pain   . Seizures   . Migraine   . Pneumonia     History reviewed. No pertinent past surgical history.  Maureen ChattersKatie Harriet Bollen, RD, LDN Pager #: [phone] After-Hours Pager #: [phone]

## 2013-11-20 ENCOUNTER — Ambulatory Visit: Payer: Self-pay | Admitting: *Deleted

## 2013-11-20 ENCOUNTER — Ambulatory Visit: Payer: Medicaid Other | Attending: Internal Medicine

## 2013-11-20 LAB — URINALYSIS, ROUTINE W REFLEX MICROSCOPIC
GLUCOSE, UA: NEGATIVE mg/dL
Glucose, UA: NEGATIVE mg/dL
HGB URINE DIPSTICK: NEGATIVE
Hgb urine dipstick: NEGATIVE
Ketones, ur: 15 mg/dL — AB
Ketones, ur: 15 mg/dL — AB
Nitrite: POSITIVE — AB
Nitrite: POSITIVE — AB
Protein, ur: 30 mg/dL — AB
Protein, ur: NEGATIVE mg/dL
Specific Gravity, Urine: 1.033 — ABNORMAL HIGH (ref 1.005–1.030)
Specific Gravity, Urine: 1.033 — ABNORMAL HIGH (ref 1.005–1.030)
Urobilinogen, UA: 0.2 mg/dL (ref 0.0–1.0)
Urobilinogen, UA: 1 mg/dL (ref 0.0–1.0)
pH: 6 (ref 5.0–8.0)
pH: 6 (ref 5.0–8.0)

## 2013-11-20 LAB — BASIC METABOLIC PANEL
BUN: 14 mg/dL (ref 6–23)
BUN: 15 mg/dL (ref 6–23)
CALCIUM: 9.2 mg/dL (ref 8.4–10.5)
CALCIUM: 8.8 mg/dL (ref 8.4–10.5)
CHLORIDE: 100 meq/L (ref 96–112)
CO2: 30 meq/L (ref 19–32)
CO2: 28 meq/L (ref 19–32)
Chloride: 101 mEq/L (ref 96–112)
Creatinine, Ser: 0.62 mg/dL (ref 0.50–1.10)
Creatinine, Ser: 0.68 mg/dL (ref 0.50–1.10)
GFR calc Af Amer: >90 mL/min (ref >90)
GFR calc Af Amer: >90 mL/min (ref >90)
GFR calc non Af Amer: >90 mL/min (ref >90)
GFR calc non Af Amer: >90 mL/min (ref >90)
Glucose, Bld: 183 mg/dL — ABNORMAL HIGH (ref 70–99)
Glucose, Bld: 155 mg/dL — ABNORMAL HIGH (ref 70–99)
POTASSIUM: 3.6 meq/L — AB (ref 3.7–5.3)
Potassium: 2.9 mEq/L — CL (ref 3.7–5.3)
SODIUM: 140 meq/L (ref 137–147)
Sodium: 143 mEq/L (ref 137–147)

## 2013-11-20 LAB — URINE CULTURE: Colony Count: >=100000

## 2013-11-20 LAB — CBC
HCT: 31.5 % — ABNORMAL LOW (ref 36.0–46.0)
HEMATOCRIT: 34.2 % — AB (ref 36.0–46.0)
Hemoglobin: 11 g/dL — ABNORMAL LOW (ref 12.0–15.0)
Hemoglobin: 10.2 g/dL — ABNORMAL LOW (ref 12.0–15.0)
MCH: 27.8 pg (ref 26.0–34.0)
MCH: 27.6 pg (ref 26.0–34.0)
MCHC: 32.2 g/dL (ref 30.0–36.0)
MCHC: 32.4 g/dL (ref 30.0–36.0)
MCV: 86.6 fL (ref 78.0–100.0)
MCV: 85.1 fL (ref 78.0–100.0)
PLATELETS: 418 10*3/uL — AB (ref 150–400)
Platelets: 424 10*3/uL — ABNORMAL HIGH (ref 150–400)
RBC: 3.95 MIL/uL (ref 3.87–5.11)
RBC: 3.7 MIL/uL — ABNORMAL LOW (ref 3.87–5.11)
RDW: 18.4 % — AB (ref 11.5–15.5)
RDW: 18.4 % — ABNORMAL HIGH (ref 11.5–15.5)
WBC: 12.3 10*3/uL — ABNORMAL HIGH (ref 4.0–10.5)
WBC: 12.4 10*3/uL — ABNORMAL HIGH (ref 4.0–10.5)

## 2013-11-20 LAB — MAGNESIUM: Magnesium: 1.8 mg/dL (ref 1.5–2.5)

## 2013-11-20 LAB — BLOOD GAS, ARTERIAL
Acid-Base Excess: 4.3 mmol/L — ABNORMAL HIGH (ref 0.0–2.0)
Bicarbonate: 27.8 mEq/L — ABNORMAL HIGH (ref 20.0–24.0)
Drawn by: 252031
FIO2: 0.21 %
O2 Saturation: 90.9 %
Patient temperature: 98.6
TCO2: 29 mmol/L (ref 0–100)
pCO2 arterial: 38.1 mmHg (ref 35.0–45.0)
pH, Arterial: 7.477 — ABNORMAL HIGH (ref 7.350–7.450)
pO2, Arterial: 59.7 mmHg — ABNORMAL LOW (ref 80.0–100.0)

## 2013-11-20 LAB — ABO/RH: ABO/RH(D): A POS

## 2013-11-20 LAB — COMPREHENSIVE METABOLIC PANEL
ALT: 5 U/L (ref 0–35)
AST: 8 U/L (ref 0–37)
Albumin: 2.5 g/dL — ABNORMAL LOW (ref 3.5–5.2)
Alkaline Phosphatase: 81 U/L (ref 39–117)
BUN: 16 mg/dL (ref 6–23)
CO2: 26 mEq/L (ref 19–32)
Calcium: 9.3 mg/dL (ref 8.4–10.5)
Chloride: 100 mEq/L (ref 96–112)
Creatinine, Ser: 0.61 mg/dL (ref 0.50–1.10)
GFR calc Af Amer: >90 mL/min (ref >90)
GFR calc non Af Amer: >90 mL/min (ref >90)
Glucose, Bld: 103 mg/dL — ABNORMAL HIGH (ref 70–99)
Potassium: 3.8 mEq/L (ref 3.7–5.3)
Sodium: 138 mEq/L (ref 137–147)
Total Bilirubin: 0.2 mg/dL — ABNORMAL LOW (ref 0.3–1.2)
Total Protein: 6.9 g/dL (ref 6.0–8.3)

## 2013-11-20 LAB — URINE MICROSCOPIC-ADD ON

## 2013-11-20 LAB — APTT: aPTT: 30 seconds (ref 24–37)

## 2013-11-20 LAB — PROTIME-INR
INR: 1.26 (ref 0.00–1.49)
Prothrombin Time: 15.5 seconds — ABNORMAL HIGH (ref 11.6–15.2)

## 2013-11-20 LAB — TYPE AND SCREEN
ABO/RH(D): A POS
Antibody Screen: NEGATIVE

## 2013-11-20 LAB — PROCALCITONIN: Procalcitonin: 0.2 ng/mL

## 2013-11-20 MED ORDER — METRONIDAZOLE IN NACL 5-0.79 MG/ML-% IV SOLN
500.0000 mg | Freq: Three times a day (TID) | INTRAVENOUS | Status: DC
  Administered 2013-11-20 (×2): 500 mg via INTRAVENOUS
  Filled 2013-11-20 (×3): qty 100

## 2013-11-20 MED ORDER — POTASSIUM CHLORIDE 10 MEQ/100ML IV SOLN
10.0000 meq | INTRAVENOUS | Status: AC
  Administered 2013-11-20 (×2): 10 meq via INTRAVENOUS
  Filled 2013-11-20 (×2): qty 100

## 2013-11-20 MED ORDER — POTASSIUM CHLORIDE 10 MEQ/100ML IV SOLN
10.0000 meq | Freq: Once | INTRAVENOUS | Status: AC
  Administered 2013-11-20: 10 meq via INTRAVENOUS
  Filled 2013-11-20: qty 100

## 2013-11-20 MED ORDER — PHENAZOPYRIDINE HCL 100 MG PO TABS
100.0000 mg | ORAL_TABLET | Freq: Three times a day (TID) | ORAL | Status: AC
  Administered 2013-11-20 – 2013-11-24 (×8): 100 mg via ORAL
  Filled 2013-11-20 (×12): qty 1

## 2013-11-20 MED ORDER — DEXTROSE 5 % IV SOLN
1.0000 g | INTRAVENOUS | Status: AC
  Administered 2013-11-20: 1 g via INTRAVENOUS
  Filled 2013-11-20 (×2): qty 10

## 2013-11-20 MED ORDER — POTASSIUM CHLORIDE 10 MEQ/100ML IV SOLN
10.0000 meq | INTRAVENOUS | Status: AC
  Administered 2013-11-20 (×2): 10 meq via INTRAVENOUS
  Filled 2013-11-20: qty 100

## 2013-11-20 NOTE — Progress Notes (Signed)
Pt c/o shooting pain in left arm from elbow to shoulder. Pain is sudden and with numbness. Prn pain med given per order. Rosezella RumpfSwann, Praise Dolecki D

## 2013-11-20 NOTE — Progress Notes (Signed)
Patient's sister presented to ask questions and initiate process due to patient being hospitalized.  LCSW met with sister and Development worker, community met with sister to share information for moving forward with further support for patient's care.  No further needs at this time.  Christene Lye MSW, LCSW

## 2013-11-20 NOTE — Progress Notes (Signed)
S:  Paged by nurse - pt with non-bloody emesis twice this evening.  Nurse requesting to give Zofran early.  Patient hemodynamically stable.  Had abdominal pain relieved after vomiting.  Advised to give Zofran IV and made patient NPO.  Patient given Zofran and vomited once more during the evening. Went to see patient and she was resting comfortably in bed but then began to complain of body aches similar to the fibromyalgia pain she experiences.  She requested her pain medications which had been held due to N/V.  Says she is not nauseous at present, has mild abdominal pain. Last BM yesterday.  O:  Vitals review - hemodynamically stable BP 130s/60s, SpO2 95% on RA General:  In bed in NAD Cardiac:  RRR, chest tender to palpation Lungs: CTA anteriorly, able to speak in full sentences, no respiratory distress Abdomen:  +BS, soft, non-distended, minimal diffuse tenderness MSK:  Generalized pain when touched  Neuro:  AAO, responding appropriately, able to move limbs voluntarily  A/P:  53 year old with recurrent pneumonitis of unknown etiology with complaint of diffuse body ache.  She is hemodynamically stable and says she experiences this pain at home.  She is due for medications which had not been given yet due to N/V.  N/V may be due to po Flagyl.  - pain medications given - po Flagyl ---> IV   Evelena PeatAlex Lexey Fletes DO

## 2013-11-20 NOTE — Progress Notes (Signed)
Refused to sign blood consent form.  Brother states patient does not want to receive blood.

## 2013-11-20 NOTE — Progress Notes (Addendum)
Patient ID: [name] [name] [name], female   DOB: 10/21/1960, 53 y.o.   MRN: [id number]      [address]       [name] [id number]             [phone]                   Procedure(s) (LRB): VIDEO BRONCHOSCOPY (N/[name] [name]) VIDEO ASSISTED THORACOSCOPY (Left) LUNG BIOPSY (Left)  LOS: 2 days   Subjective: complaints of diffuse tenderness of skin and joints, esp left shoulder  Objective: Vital signs in last 24 hours: Patient Vitals for the past 24 hrs:  BP Temp Temp src Pulse Resp SpO2  11/20/13 0800 - 99 F (37.2 C) Oral - - -  11/20/13 0410 125/84 mmHg 99.1 F (37.3 C) Oral 106 28 96 %  11/20/13 0000 131/83 mmHg 98.5 F (36.9 C) Oral 95 24 100 %  11/19/13 2210 123/74 mmHg - - 75 19 98 %  11/19/13 2118 - - - - - 100 %  11/19/13 2000 129/79 mmHg 98.5 F (36.9 C) Oral 94 28 100 %  11/19/13 1600 - 99.1 F (37.3 C) Oral - - -  11/19/13 1457 - 99.3 F (37.4 C) Oral - - -  11/19/13 1455 111/72 mmHg - - 104 24 100 %  11/19/13 1200 114/92 mmHg 98.2 F (36.8 C) Oral 122 34 100 %    Filed Weights   11/18/13 1000  Weight: 124 lb 1.9 oz (56.3 kg)    Hemodynamic parameters for last 24 hours:    Intake/Output from previous day: 03/04 0701 - 03/05 0700 In: 763 [P.O.:460; I.V.:3; IV Piggyback:300] Out: 550 [Urine:550] Intake/Output this shift:    Scheduled Meds: . ceFEPime (MAXIPIME) IV  1 g Intravenous Q8H  . etodolac  500 mg Oral BID  . fentaNYL  50 mcg Transdermal Q72H  . folic acid  1 mg Oral Daily  . heparin subcutaneous  5,000 Units Subcutaneous 3 times per day  . ipratropium-albuterol  3 mL Nebulization TID  . levETIRAcetam  500 mg Oral BID  . methylPREDNISolone (SOLU-MEDROL) injection  80 mg Intravenous 3 times per day  . metronidazole  500 mg Intravenous Q8H  . pantoprazole  40 mg Oral BID  . potassium chloride SA  40 mEq Oral Once  . pregabalin  300 mg Oral BID  . saccharomyces boulardii  250 mg Oral BID  . sodium chloride  3 mL Intravenous Q12H  .  vancomycin  750 mg Intravenous Q12H  . vitamin B-12  2,000 mcg Oral Daily   Continuous Infusions:  PRN Meds:.morphine injection, ondansetron (ZOFRAN) IV, ondansetron, polyethylene glycol, SUMAtriptan, traMADol  General appearance: alert, cooperative, cachectic, fatigued and mild distress Neurologic: intact Heart: regular rate and rhythm, S1, S2 normal, no murmur, click, rub or gallop Lungs: diminished breath sounds bilaterally Abdomen: soft, non-tender; bowel sounds normal; no masses,  no organomegaly Extremities: edema both lower extremities  Lab Results: CBC: Recent Labs  11/19/13 0250 11/20/13 0215  WBC 14.9* 12.3*  HGB 11.5* 11.0*  HCT 36.4 34.2*  PLT 340 418*   BMET:  Recent Labs  11/19/13 0250 11/20/13 0215  NA 141 143  K 3.5* 2.9*  CL 98 100  CO2 26 30  GLUCOSE 138* 183*  BUN 7 14  CREATININE 0.47* 0.62  CALCIUM 9.5 9.2    PT/INR:  Recent Labs  11/18/13 1657  LABPROT 17.1*  INR 1.43     Radiology Dg Chest 2 View  11/18/2013   CLINICAL DATA:  Chest pain and short of breath  EXAM: CHEST  2 VIEW  COMPARISON:  11/05/2013  FINDINGS: Interval development of upper lobe infiltrates bilaterally. Lung bases are clear. No pleural effusion. Negative for heart failure. Heart size is normal.  IMPRESSION: Interval development of upper lobe infiltrates bilaterally, probable pneumonia   Electronically Signed   By: [name] [name] [name] M.D.   On: 11/18/2013 10:40   Ct Chest Wo Contrast  11/19/2013   CLINICAL DATA:  53 year old female with left side chest pain and shortness of Breath. Worsening upper lobe pulmonary opacity. Recurrent pneumonitis of unknown etiology. Initial encounter.  EXAM: CT CHEST WITHOUT CONTRAST  TECHNIQUE: Multidetector CT imaging of the chest was performed following the standard protocol without IV contrast.  COMPARISON:  Chest radiographs 11/18/2013 and earlier. Chest CTA 11/02/2013.  FINDINGS: Major airways remain patent. Since 11/02/2013, [name] [name] general pattern  of widespread bilateral geographic and centrilobular ground-glass opacity continues. Upper lobe predominance. Sparing of the immediate subpleural lung. Superimposed peribronchovascular more confluent opacity and septal thickening has progressed, particularly in the left upper lung (series 3, image 13 today versus series 6, image 118 on the CTA) and probably is responsible for the radiographic progression.  No areas of bone [name] [name] 5 consolidation. Occasional superimposed calcified granulomas are present in both lungs. No pleural or pericardial effusion.  Stable mediastinal nodes, most within normal limits, some maximal in size. Small but increased hilar nodes were better visualized on the prior study with contrast. There is [name] [name] calcified right hilar node.  Negative non contrast thoracic inlet. No axillary lymphadenopathy. Negative visible non contrast liver, gallbladder, spleen, pancreas, adrenal glands, left kidney, and bowel. Multiple nonobstructing right intra renal upper pole calculi.  No acute osseous abnormality identified.  Osteopenia.  IMPRESSION: 1. Continued pattern of abnormal opacity in the lungs with upper lobe predominance and some sparing of the immediate subpleural lung. Recently progressed septal and peribronchovascular thickening. Of note, this process has waxed and waned (see 10/15/2013 CT). The pattern is nonspecific, but in absence of clinical signs of infection consider cryptogenic organizing pneumonia. 2. Occasional calcified granulomas and hilar nodes in the chest. 3. Right nephrolithiasis.   Electronically Signed   By: [name] [name] [name] M.D.   On: 11/19/2013 10:18     Assessment/Plan: S/P Procedure(s) (LRB): VIDEO BRONCHOSCOPY (N/[name] [name]) VIDEO ASSISTED THORACOSCOPY (Left) LUNG BIOPSY (Left) plan lung bx tomorrow, again reviewed with patient today she is speaking english without difficulty, notes she worked in [employer], no toxic exposure in the work place preop orders placed will  need k replaced  The goals risks and alternatives of the planned surgical procedure bronch and left vats and lung bx have been discussed with the patient in detail. The risks of the procedure including death, infection, stroke, myocardial infarction, bleeding, blood transfusion, respiratory failure requiring prolonged vent support have all been discussed specifically.  I have quoted [name] [name] [name] 5% of perioperative mortality and [name] [name] complication rate as high as 60%. The patient's questions have been answered.Adasia Hoar is willing  to proceed with the planned procedure. Delight Ovens MD 11/20/2013 8:57 AM

## 2013-11-20 NOTE — Progress Notes (Signed)
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 Just discharged on 2/19 w/ recommendations for steroid taper w/ suggestion that she would need Open lung bx should this re-occur. At time of d/c her CXR was essentially clear. She was re-admitted on 3/3 w/ CC: 1 d/ h/o chest pain and progressive dyspnea. CXR w/ reoccurrence of bilateral pulmonary infiltrates. PCCM was asked to eval.    SIGNIFICANT EVENTS:  STUDIES:  1/28 CTA chest >>> No PE. Bilateral prominently upper lobe mixed airspace and ground-glass opacities, multi lobar pneumonia is favored. Mediastinal and bilateral hilar adenopathy  1/28 Bronchoscopy >>> 6688 WBC (93% neutrophils), Cx negative 1/28 TTE >>> EF 65-70%, grade 1 DD, PA peak 38, no change from prior   1/28 Labs >>> P ANCA 1:20, ANA 1:80, DS DNA 1, RF 13, ESR 98 2/07 Labs >>> CPK 31, SS-A < 1, SS-B < 1, ACE 14 2/09 Labs >>> P ANCA 1:20, MPO 1.1 2/15 Chest CT >>> No pulmonary embolism, persistent upper lobe predominant bilateral airspace and ground-glass opacities, improved relative to most recent CT from 1/28, mediastinal and bilateral adenopathy 2/16 Echo  >>> EF 57%, grade 1 systolic dysfunction  0/17 CT chest >>> diffuse b/l GGO with upper lobe predominance  LINES / TUBES:   CULTURES: Blood 3/03 >>>   ANTIBIOTICS: Cefepime 3/3 >>> vanc 3/3 >>> Flagyl 3/3 >>>3/05  SUBJECTIVE:  Seen by thoracic surgery yesterday.  VITAL SIGNS: Temp:  [98.5 F (36.9 C)-99.3 F (37.4 C)] 99 F (37.2 C) (03/05 1100) Pulse Rate:  [75-106] 106 (03/05 0410) Resp:  [19-28] 28 (03/05 0410) BP: (111-131)/(72-84) 125/84 mmHg (03/05 0410) SpO2:  [96 %-100 %] 98 % (03/05 0900)  PHYSICAL  EXAMINATION: General:  Ill appearing Neuro: normal strength HEENT: no sinus tenderness Cardiovascular: regular, tachycardic Lungs:  Dry crackles bases Abdomen:  Soft, mild tenderness Musculoskeletal: 1+ edema Skin: no rashes  CBC Recent Labs     11/18/13  0945  11/19/13  0250  11/20/13  0215  WBC  31.2*  14.9*  12.3*  HGB  11.2*  11.5*  11.0*  HCT  35.6*  36.4  34.2*  PLT  366  340  418*    Coag's Recent Labs     11/18/13  1657  INR  1.43    BMET Recent Labs     11/19/13  0250  11/20/13  0215  11/20/13  1145  NA  141  143  140  K  3.5*  2.9*  3.6*  CL  98  100  101  CO2  '26  30  28  ' BUN  '7  14  15  ' CREATININE  0.47*  0.62  0.68  GLUCOSE  138*  183*  155*    Electrolytes Recent Labs     11/19/13  0250  11/20/13  0215  11/20/13  1145  CALCIUM  9.5  9.2  8.8  MG   --   1.8   --    Liver Enzymes Recent Labs     11/18/13  0945  AST  16  ALT  7  ALKPHOS  119*  BILITOT  0.5  ALBUMIN  2.5*    Cardiac Enzymes Recent Labs  11/18/13  0945  11/18/13  1659  11/18/13  2300  TROPONINI   --   <0.30  <0.30  PROBNP  585.4*   --    --     Glucose No results found for this basename: GLUCAP,  in the last 72 hours  Imaging Ct Chest Wo Contrast  11/19/2013   CLINICAL DATA:  53 year old female with left side chest pain and shortness of Breath. Worsening upper lobe pulmonary opacity. Recurrent pneumonitis of unknown etiology. Initial encounter.  EXAM: CT CHEST WITHOUT CONTRAST  TECHNIQUE: Multidetector CT imaging of the chest was performed following the standard protocol without IV contrast.  COMPARISON:  Chest radiographs 11/18/2013 and earlier. Chest CTA 11/02/2013.  FINDINGS: Major airways remain patent. Since 11/02/2013, a general pattern of widespread bilateral geographic and centrilobular ground-glass opacity continues. Upper lobe predominance. Sparing of the immediate subpleural lung. Superimposed peribronchovascular more confluent opacity and septal  thickening has progressed, particularly in the left upper lung (series 3, image 13 today versus series 6, image 118 on the CTA) and probably is responsible for the radiographic progression.  No areas of bone a 5 consolidation. Occasional superimposed calcified granulomas are present in both lungs. No pleural or pericardial effusion.  Stable mediastinal nodes, most within normal limits, some maximal in size. Small but increased hilar nodes were better visualized on the prior study with contrast. There is a calcified right hilar node.  Negative non contrast thoracic inlet. No axillary lymphadenopathy. Negative visible non contrast liver, gallbladder, spleen, pancreas, adrenal glands, left kidney, and bowel. Multiple nonobstructing right intra renal upper pole calculi.  No acute osseous abnormality identified.  Osteopenia.  IMPRESSION: 1. Continued pattern of abnormal opacity in the lungs with upper lobe predominance and some sparing of the immediate subpleural lung. Recently progressed septal and peribronchovascular thickening. Of note, this process has waxed and waned (see 10/15/2013 CT). The pattern is nonspecific, but in absence of clinical signs of infection consider cryptogenic organizing pneumonia. 2. Occasional calcified granulomas and hilar nodes in the chest. 3. Right nephrolithiasis.   Electronically Signed   By: Lars Pinks M.D.   On: 11/19/2013 10:18       ASSESSMENT / PLAN:  Recurrent pneumonitis ? Cause >> partial response to steroids.  Lab evaluation non-specific to date. P: -continue solumedrol -for VATS 3/06 >> may need vent after surgery, and likely will need to be in ICU after surgery -Abx per primary team  Chesley Mires, MD [place] 11/20/2013, 2:14 PM Pager:  [phone] After 3pm call: [phone]

## 2013-11-20 NOTE — Progress Notes (Signed)
  Date: 11/20/2013  Patient name: [name] [name]  Medical record number: [id number]  Date of birth: 11/04/1960   This patient has been seen and the plan of care was discussed with the house staff. Please see their note for complete details. I concur with their findings with the following additions/corrections: She has responded well to steroids. Given her recurrences of bilat pulm infiltrates, I agree that bx is indicated. We can hold off on further abx therapy at this time, unless pulmonary feels these need to be continued. She is complaining of significant dysuria, suggest repeat UCx due to multiple bacteria on previous Cx. May use pyridium to help with dysuria, 100 mg tid for the next day or two. Depending on UCx data, will need to choose UTI coverage. Will need to examine old UCx to decide on agent. As far as recent Cdiff, she has completed 2 weeks of therapy. Watch for recurrence of diarrhea, otherwise can D/C precautions.   Jonah BlueAlejandro Ahijah Devery, DO, FACP [facility] 11/20/2013, 2:47 PM

## 2013-11-20 NOTE — Progress Notes (Addendum)
Subjective:  Pt seen and examined in AM. Pt with nausea and vomiting last night that resolved after 2 episodes. She continues to have epigastric pain. She reports her breathing has improved as well as cough. No fever but reports chills. She reports diarrhea has resolved (per nurse 4 BM yesterday). She also complains of urinary burning.      Objective: Vital signs in last 24 hours: Filed Vitals:   11/20/13 0410 11/20/13 0800 11/20/13 0900 11/20/13 1100  BP: 125/84     Pulse: 106     Temp: 99.1 F (37.3 C) 99 F (37.2 C)  99 F (37.2 C)  TempSrc: Oral Oral  Oral  Resp: 28     Height:      Weight:      SpO2: 96%  98%    Weight change:   Intake/Output Summary (Last 24 hours) at 11/20/13 1451 Last data filed at 11/20/13 0058  Gross per 24 hour  Intake    600 ml  Output    200 ml  Net    400 ml    Constitutional: She is oriented to person, place, and time. No distress. Ill-appearing  Head: Normocephalic and atraumatic.  Mouth/Throat: Oropharynx is clear and moist. No oropharyngeal exudate.  Eyes: EOMI  Neck: Normal range of motion. Neck supple.  Cardiovascular: Tacycardic Regular rhythm and normal heart sounds.  Pulmonary/Chest: She has decreased breath sounds with rales at bases.  Dyspnea with speaking full sentances  Abdominal: Soft. Bowel sounds are normal. She exhibits no distension. There is tenderness (epigastric). There is no rebound and no guarding.  Musculoskeletal: Normal range of motion. She exhibits edema (trace b/l) and tenderness (diffuse).  Neurological: She is alert and oriented to person, place, and time. No cranial nerve deficit.  Skin: Skin is warm and dry. No rash noted. She is not diaphoretic. No erythema. No pallor.  Psychiatric: She has a normal mood and affect. Her behavior is normal. Judgment and thought content normal.    Lab Results: Basic Metabolic Panel:  Recent Labs Lab 11/20/13 0215 11/20/13 1145  NA 143 140  K 2.9* 3.6*  CL 100 101    CO2 30 28  GLUCOSE 183* 155*  BUN 14 15  CREATININE 0.62 0.68  CALCIUM 9.2 8.8  MG 1.8  --    Liver Function Tests:  Recent Labs Lab 11/18/13 0945  AST 16  ALT 7  ALKPHOS 119*  BILITOT 0.5  PROT 7.9  ALBUMIN 2.5*   CBC:  Recent Labs Lab 11/18/13 0945 11/19/13 0250 11/20/13 0215  WBC 31.2* 14.9* 12.3*  NEUTROABS 28.4*  --   --   HGB 11.2* 11.5* 11.0*  HCT 35.6* 36.4 34.2*  MCV 89.0 87.7 86.6  PLT 366 340 418*   Cardiac Enzymes:  Recent Labs Lab 11/18/13 1659 11/18/13 2300  TROPONINI <0.30 <0.30   BNP:  Recent Labs Lab 11/18/13 0945  PROBNP 585.4*   D-Dimer:  Recent Labs Lab 11/18/13 0945  DDIMER 2.23*    Coagulation:  Recent Labs Lab 11/18/13 1657  LABPROT 17.1*  INR 1.43    Urinalysis:  Recent Labs Lab 11/18/13 1219 11/20/13 0119  COLORURINE YELLOW AMBER*  LABSPEC 1.019 1.033*  PHURINE 7.0 6.0  GLUCOSEU NEGATIVE NEGATIVE  HGBUR NEGATIVE NEGATIVE  BILIRUBINUR MODERATE* LARGE*  KETONESUR NEGATIVE 15*  PROTEINUR NEGATIVE 30*  UROBILINOGEN 0.2 0.2  NITRITE NEGATIVE POSITIVE*  LEUKOCYTESUR MODERATE* SMALL*    Micro Results: Recent Results (from the past 240 hour(s))  CULTURE, BLOOD (ROUTINE  X 2)     Status: None   Collection Time    11/18/13  9:45 AM      Result Value Ref Range Status   Specimen Description BLOOD LEFT ANTECUBITAL   Final   Special Requests BOTTLES DRAWN AEROBIC AND ANAEROBIC 10CC   Final   Culture  Setup Time     Final   Value: 11/18/2013 14:27     Performed at [facility]   Culture     Final   Value:        BLOOD CULTURE RECEIVED NO GROWTH TO DATE CULTURE WILL BE HELD FOR 5 DAYS BEFORE ISSUING A FINAL NEGATIVE REPORT     Performed at [facility]   Report Status PENDING   Incomplete  CULTURE, BLOOD (ROUTINE X 2)     Status: None   Collection Time    11/18/13 11:32 AM      Result Value Ref Range Status   Specimen Description BLOOD HAND RIGHT   Final   Special Requests BOTTLES DRAWN  AEROBIC AND ANAEROBIC 5CC   Final   Culture  Setup Time     Final   Value: 11/18/2013 16:09     Performed at [facility]   Culture     Final   Value:        BLOOD CULTURE RECEIVED NO GROWTH TO DATE CULTURE WILL BE HELD FOR 5 DAYS BEFORE ISSUING A FINAL NEGATIVE REPORT     Performed at [facility]   Report Status PENDING   Incomplete  URINE CULTURE     Status: None   Collection Time    11/18/13 12:19 PM      Result Value Ref Range Status   Specimen Description URINE, CLEAN CATCH   Final   Special Requests NONE   Final   Culture  Setup Time     Final   Value: 11/18/2013 12:37     Performed at [facility] Count     Final   Value: >=100,000 COLONIES/ML     Performed at [facility]   Culture     Final   Value: Multiple bacterial morphotypes present, none predominant. Suggest appropriate recollection if clinically indicated.     Performed at [facility]   Report Status 11/20/2013 FINAL   Final   Studies/Results: Ct Chest Wo Contrast  11/19/2013   CLINICAL DATA:  53 year old female with left side chest pain and shortness of Breath. Worsening upper lobe pulmonary opacity. Recurrent pneumonitis of unknown etiology. Initial encounter.  EXAM: CT CHEST WITHOUT CONTRAST  TECHNIQUE: Multidetector CT imaging of the chest was performed following the standard protocol without IV contrast.  COMPARISON:  Chest radiographs 11/18/2013 and earlier. Chest CTA 11/02/2013.  FINDINGS: Major airways remain patent. Since 11/02/2013, a general pattern of widespread bilateral geographic and centrilobular ground-glass opacity continues. Upper lobe predominance. Sparing of the immediate subpleural lung. Superimposed peribronchovascular more confluent opacity and septal thickening has progressed, particularly in the left upper lung (series 3, image 13 today versus series 6, image 118 on the CTA) and probably is responsible for the radiographic progression.  No areas of  bone a 5 consolidation. Occasional superimposed calcified granulomas are present in both lungs. No pleural or pericardial effusion.  Stable mediastinal nodes, most within normal limits, some maximal in size. Small but increased hilar nodes were better visualized on the prior study with contrast. There is a calcified right hilar node.  Negative non  contrast thoracic inlet. No axillary lymphadenopathy. Negative visible non contrast liver, gallbladder, spleen, pancreas, adrenal glands, left kidney, and bowel. Multiple nonobstructing right intra renal upper pole calculi.  No acute osseous abnormality identified.  Osteopenia.  IMPRESSION: 1. Continued pattern of abnormal opacity in the lungs with upper lobe predominance and some sparing of the immediate subpleural lung. Recently progressed septal and peribronchovascular thickening. Of note, this process has waxed and waned (see 10/15/2013 CT). The pattern is nonspecific, but in absence of clinical signs of infection consider cryptogenic organizing pneumonia. 2. Occasional calcified granulomas and hilar nodes in the chest. 3. Right nephrolithiasis.   Electronically Signed   By: Augusto Gamble M.D.   On: 11/19/2013 10:18   Medications: I have reviewed the patient's current medications. Scheduled Meds: . ceFEPime (MAXIPIME) IV  1 g Intravenous Q8H  . etodolac  500 mg Oral BID  . fentaNYL  50 mcg Transdermal Q72H  . folic acid  1 mg Oral Daily  . heparin subcutaneous  5,000 Units Subcutaneous 3 times per day  . ipratropium-albuterol  3 mL Nebulization TID  . levETIRAcetam  500 mg Oral BID  . methylPREDNISolone (SOLU-MEDROL) injection  80 mg Intravenous 3 times per day  . pantoprazole  40 mg Oral BID  . phenazopyridine  100 mg Oral TID WC  . potassium chloride SA  40 mEq Oral Once  . pregabalin  300 mg Oral BID  . saccharomyces boulardii  250 mg Oral BID  . sodium chloride  3 mL Intravenous Q12H  . vancomycin  750 mg Intravenous Q12H  . vitamin B-12  2,000 mcg  Oral Daily   Continuous Infusions:  PRN Meds:.morphine injection, ondansetron (ZOFRAN) IV, ondansetron, polyethylene glycol, SUMAtriptan, traMADol Assessment/Plan:  Assessment: 53 year old [place] female with past medical history of fibromyalgia, seizure disorder, and migraines with recent multiple admissions for respiratory distress who presents with acute onset chest pain and found to have sepsis with new infiltrates on chest xray.   Plan:   Recurrent Pneumonitis responsive to corticosteroid therapy  - improving. Currently 98% on RA. Afebrile with improving leukocytosis. CT chest w/o cont on 3.4 with continued pattern of abnormal opacity in the lungs with upper lobe predominance and some sparing of the immediate subpleural lung. Also with recently progressed septal and peribronchovascular thickening.  Etiology unkown possibly due to autoimmune disease (sarcoid) vs ILD vs COP. Pt is a former smoker with FH of SLE in grandmother. No history of occupational exposures.  -Oxygen therapy to keep SpO2> 92 % (pt at home on 3.5L O2 PRN)  -Awaiting blood cultures x 2 --> NGTD  -Obtain procalcitonin level (0.2) --> stop  IV vancomycin and cefepime (Day 3) -Duoneb breathing treatment TID  -IV solumedrol 80 mg TID   -Monitor leukocytosis --> 31.2K on admission  to 12.3K  -Obtain hypersensitivity pnuemonitis, anti-Jo, anti- scleroderma  -Appreciate pulmonology and CVTS recommendations --->  pt to have bronchoscopy with left VATS/lung biopsies on 3/6 and likely will need ICU stay s/p surgery   Hypokalemia - Pt with K 2.9 on 3/5 (3.5 on 3/4) and received IV KCl with improvement to 3.6. Etiology likely due to GI loss (emesis, diarrhea) vs inadequate nutrition. -Monitor BMP -Obtain Mg level --> 1.8 -Replete as necessary  Symptomatic UTI - Pt with moderate leukocytes, pyuria and few bacteruria. Pt today complains of burning on urination and frequency (received IV lasix yesterday).  Pt with evidence of  right nephrolithiasis on recent CT imaging. UA with no hematuria.  No prior  h/o of UTI in previous hospitalizations.  -Awaiting urine cultures ---> >100K multiple bacterial morphotypes present -Obtain repeat urine cultures (clean catch)  -Received 2 days of  IV cefepime, start IV ceftriaxone 1 g x 1 day (reasses for additional course) -Pyridium 100 mg TID     Acute on chronic epigastric pain -Pt reported increased epigastric pain last night.  Etiology unknown, most likely due to gastritis (chronic etodolac use) vs PUD vs GERD.  No prior EGD testing. -Obtain H. Pylori stool antigen   -IV Zofran Q 6hr for nausea -Continue protonix 40 mg BID -Miralax daily PRN constipation  Pleuritic Chest Pain - Pt with similar complaint at prior hospitalization. Pt with no history of CAD. Pt with evidence of tenderness to palpation on exam suggestive of possible costochondritis. D-dimer was elevated (2.23) however recent CTA chest on 2/15 with no PE. Troponins were negative and no ischemic changes on 12-lead EKG.  -IV morphine Q 4 hr pain  -Continue home tramadol 50 mg BID PRN pain  -Continue home etodolac 500 mg BID  -Continue home fentanyl 50 mcg patch Q 72 hrs  -Monitor on telemetry   History of Grade 1 Diastolic CHF - 2D-echo on 1/28 with EF 65-70% with grade 1 diastolic dysfunction (not present on 2/16 2D-echo) PT with elevated Pro-BNP 585.4 but lower than previous admission (3533). Pt received IV Lasix 40 mg on night of admission. -Monitor daily weights and volume status   C difficile Infection - resolved. Pt with + c diff toxin on last hospitalization and completed treatment after discharge (2/19 for 10 days). Pt received metronidazole (PO then IV) on 3/3-3/5 for total 2 days. -Continue probiotic 250 mg BID  -D/C contact isolation (per ID if diarrhea resolved no further need for isolation)   Seizure Disorder - Pt with no recent seizure activity.  -Continue keppra 500 mg BID   Fibromyalgia - Pt with  diffuse body aches and pain unchanged from baseline.  -Continue home tramadol 50 mg BID PRN pain  -Continue home etodolac 500 mg BID  -Continue home fentanyl 50 mcg patch Q 72 hrs  -Continue home pregabalin 300 mg BID -Obtain PT consult   Migraine headache - currently without migraine headache -Continue home sumatriptan Q 2hr PRN  Chronic Normocytic Anemia with folate/B12 deficiency - Hg above baseline of 10. 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 Pt with no recent active bleeding or hemodynamic instability.  -Continue to monitor  -Monitor for bleeding  -Transfuse if Hg<7  -Continue folate 1 mg daily  -Continue B12 supplements 2000 mcg daily   Vitamin D Deficiency  - Pt with recent 25-OH Vitamin D of 13.  -Continue vitamin D 50,000 units every 7 days (last dose?)   Severe Malnutrition in setting of hypoalbuminemia - Pt with chronic low albumin levels. Etiology most likley due to chronic protein malnutrition vs falsely low in setting of acute infection (acute phase reactant).  -Ensure Complete BID -Regular diet  GERD - currently without reflex symptoms, but acute on chronic epigastric pain -Continue home pantoprazole 40 mg BID   Diet: Regular  DVT Ppx: SCDs (lung biopsy planned tomm) Code: Full   Dispo: Disposition is deferred at this time, awaiting improvement of current medical problems.   The patient does have a current PCP Rocco Serene(Morgan Rogers, MD) and [facility] after discharge.  The patient does not have transportation limitations that hinder transportation to clinic appointments.  .Services Needed at time of  discharge: Y = Yes, Blank = No PT:   OT:   RN:   Equipment:   Other:     LOS: 2 days   Otis Brace, MD 11/20/2013, 2:51 PM

## 2013-11-20 NOTE — Evaluation (Signed)
Physical Therapy Evaluation and Treatment Patient Details Name: Valree Feild MRN: [id number] DOB: [birth year] Today's Date: 11/20/2013 Time: 1324-4010 PT Time Calculation (min): 37 min  PT Assessment / Plan / Recommendation  History of Present Illness Brennen Gardiner is a 53 year old [place] female with past medical history of fibromyalgia, seizure disorder, and migraines with recent admissions for CAP (1/18-1/23), HCAP complicated by septic shock and AMS requiring ICU-level care (1/27-2/10/15), and (2/15-19) for acute respiratory failure with treatment with corticosteroid therapy who presents with acute onset chest pain. Pt reports that 1 day while lying down she began having acute onset of left sided chest pain worsened with deep breathing. Pt also reports palpitations and diaphoresis. She has been on 3.5 L of oxygen as needed at home however in the past 2-3 days has been requiring it continuously. She denies fever, but does reports chills and green sputum cough for the past 2 days.    PT Comments   Pt was adm with the dx listed above. Pt required repeated verbal cues and was slow to follow commands for safety and sequencing during transfers. Pt's functional mobility was limited to pain. Pt lacked motivation throughout session. Pt to benefit from skilled acute PT to address deficits listed below and to improve functional mobility.   Follow Up Recommendations  SNF     Does the patient have the potential to tolerate intense rehabilitation     Barriers to Discharge Decreased caregiver support (multiple falls)      Equipment Recommendations  Rolling walker with 5" wheels    Recommendations for Other Services OT consult (Simultaneous filing. User may not have seen previous data.)  Frequency Min 3X/week (Simultaneous filing. User may not have seen previous data.)   Progress towards PT Goals    Plan      Precautions / Restrictions Precautions Precautions: Fall Precaution Comments: reports  frequent falls Restrictions Weight Bearing Restrictions: No   Pertinent Vitals/Pain HR increased to 140s with activities (RN aware). Pain was increased with activity.    Mobility  Bed Mobility Overal bed mobility: Needs Assistance Bed Mobility: Rolling;Supine to Sit Rolling: Min guard Sidelying to sit: Min assist Supine to sit: Min guard (swing legs EOB) Sit to supine: Mod assist (swing legs and guide upper body) General bed mobility comments: slow to follow commands and required assist due to weakness Transfers Overall transfer level: Needs assistance Equipment used: 2 person hand held assist Transfers: Sit to/from Stand Sit to Stand: +2 physical assistance;Min assist General transfer comment: cues for hand placement and sequencing. slow to follow commands secondary to pain; very anxious with mobility  Ambulation/Gait General Gait Details: refused to today secondary to pain     Exercises General Exercises - Lower Extremity Ankle Circles/Pumps: AAROM;Strengthening;Both;Supine;10 reps   PT Diagnosis: Difficulty walking;Generalized weakness;Acute pain (Simultaneous filing. User may not have seen previous data.)  PT Problem List: Decreased strength;Decreased activity tolerance;Decreased balance;Decreased mobility;Decreased knowledge of use of DME;Decreased cognition;Decreased safety awareness;Pain (Simultaneous filing. User may not have seen previous data.) PT Treatment Interventions: DME instruction;Gait training;Stair training;Functional mobility training;Therapeutic activities;Therapeutic exercise;Balance training;Patient/family education (Simultaneous filing. User may not have seen previous data.)   PT Goals (current goals can now be found in the care plan section) Acute Rehab PT Goals Patient Stated Goal: to get stronger and then go home PT Goal Formulation: With patient Time For Goal Achievement: 12/04/13 Potential to Achieve Goals: Good  Visit Information  Last PT Received  On: 11/20/13 Assistance Needed: +2 (for safety) History of Present Illness:  Maxwell Caulieves Leavy is a 53 year old [place] female with past medical history of fibromyalgia, seizure disorder, and migraines with recent admissions for CAP (1/18-1/23), HCAP complicated by septic shock and AMS requiring ICU-level care (1/27-2/10/15), and (2/15-19) for acute respiratory failure with treatment with corticosteroid therapy who presents with acute onset chest pain. Pt reports that 1 day while lying down she began having acute onset of left sided chest pain worsened with deep breathing. Pt also reports palpitations and diaphoresis. She has been on 3.5 L of oxygen as needed at home however in the past 2-3 days has been requiring it continuously. She denies fever, but does reports chills and green sputum cough for the past 2 days.     Subjective Data  Patient Stated Goal: to get stronger and then go home   Cognition  Cognition Arousal/Alertness: Awake/alert Behavior During Therapy: Anxious Overall Cognitive Status: No family/caregiver present to determine baseline cognitive functioning Area of Impairment: Problem solving;Attention;Safety/judgement;Awareness Current Attention Level: Selective Memory: Decreased short-term memory Safety/Judgement: Decreased awareness of safety Awareness: Intellectual Problem Solving: Slow processing;Difficulty sequencing;Requires verbal cues General Comments: Pt was reluctant to participate with therapy. Asks to have O2 on when sats are 97% or higher. Requires repeated cues for safe hand placement for transfers    Balance  Balance Overall balance assessment: Needs assistance Sitting-balance support: Bilateral upper extremity supported Sitting balance-Leahy Scale: Fair Standing balance support: Bilateral upper extremity supported Standing balance-Leahy Scale: Poor Standing balance comment: Pt unable to commit to standing due to pain and weakness. General Comments General  comments (skin integrity, edema, etc.): Pt was slow to follow commands and req multiple verbal cues for hand placement, safety, and sequency  End of Session PT - End of Session Equipment Utilized During Treatment: Gait belt Activity Tolerance: Patient limited by fatigue;Patient limited by pain Patient left: in bed;with call bell/phone within reach Nurse Communication: Mobility status   GP    Malachi ParadiseKohler, Andrew [address] Devon LanePT  Rachella Basden, [place]lingtonBrittany N, [place] CarolinaPT [phone] 11/20/2013, 5:47 PM

## 2013-11-20 NOTE — Evaluation (Signed)
Physical Therapy Evaluation and Treatment Patient Details Name: [name] [name] Saha MRN: [id number] DOB: 03/07/1961 Today's Date: 11/20/2013 Time:  -     PT Assessment / Plan / Recommendation  History of Present Illness [name] [name] Heggie is a 53 year old [place] female with past medical history of fibromyalgia, seizure disorder, and migraines with recent admissions for CAP (1/18-1/23), HCAP complicated by septic shock and AMS requiring ICU-level care (1/27-2/10/15), and (2/15-19) for acute respiratory failure with treatment with corticosteroid therapy who presents with acute onset chest pain. Pt reports that 1 day while lying down she began having acute onset of left sided chest pain worsened with deep breathing. Pt also reports palpitations and diaphoresis. She has been on 3.5 L of oxygen as needed at home however in the past 2-3 days has been requiring it continuously. She denies fever, but does reports chills and green sputum cough for the past 2 days.    PT Comments   Pt was adm with the dx listed above. Pt required repeated verbal cues and was slow to follow commands for safety and sequencing during transfers. Pt's functional mobility was limited to pain. Pt lacked motivation throughout session. Pt to benefit from skilled acute PT to address deficits listed below and to improve functional mobility.   Follow Up Recommendations        Does the patient have the potential to tolerate intense rehabilitation     Barriers to Discharge Decreased caregiver support (multiple falls)      Equipment Recommendations       Recommendations for Other Services OT consult  Frequency Min 3X/week   Progress towards PT Goals    Plan      Precautions / Restrictions Precautions Precautions: Fall Restrictions Weight Bearing Restrictions: No   Pertinent Vitals/Pain HR increased to 140s with activities (RN aware). Pain was increased with activity.    Mobility  Bed Mobility Overal bed mobility: Needs  Assistance Bed Mobility: Rolling;Supine to Sit Rolling: Min guard Sidelying to sit: Min assist Supine to sit: Min guard (swing legs EOB) Sit to supine: Mod assist (swing legs and guide upper body) General bed mobility comments: slow to follow commands and required assist due to weakness Transfers Overall transfer level: Needs assistance Equipment used: 2 person hand held assist Transfers: Sit to/from Stand Sit to Stand: +2 physical assistance;Min assist General transfer comment: cues for hand placement and sequencing. slow to follow commands secondary to pain    Exercises General Exercises - Lower Extremity Ankle Circles/Pumps: AAROM;Strengthening;Both;Supine;10 reps   PT Diagnosis: Generalized weakness;Acute pain  PT Problem List: Decreased strength;Decreased activity tolerance;Decreased balance;Decreased mobility;Decreased knowledge of use of DME;Decreased safety awareness;Pain PT Treatment Interventions: DME instruction;Gait training;Functional mobility training;Therapeutic activities;Therapeutic exercise;Patient/family education;Balance training   PT Goals (current goals can now be found in the care plan section)    Visit Information  Last PT Received On: 11/20/13 Assistance Needed: +2 (for safety) History of Present Illness: [name] [name] [name] [name] is a 53 year old [place] female with past medical history of fibromyalgia, seizure disorder, and migraines with recent admissions for CAP (1/18-1/23), HCAP complicated by septic shock and AMS requiring ICU-level care (1/27-2/10/15), and (2/15-19) for acute respiratory failure with treatment with corticosteroid therapy who presents with acute onset chest pain. Pt reports that 1 day while lying down she began having acute onset of left sided chest pain worsened with deep breathing. Pt also reports palpitations and diaphoresis. She has been on 3.5 L of oxygen as needed at home however in the past 2-3 days has been  requiring it continuously. She  denies fever, but does reports chills and green sputum cough for the past 2 days.     Subjective Data      Cognition  Cognition Arousal/Alertness: Awake/alert Behavior During Therapy: Agitated Overall Cognitive Status: Difficult to assess Safety/Judgement: Decreased awareness of safety Problem Solving: Slow processing;Difficulty sequencing;Requires verbal cues General Comments: Pt was stubborn to participate with therapy. Asks to have O2 on when sats are 97% or higher. Requires repeated cues for safe hand placement for transfers    Balance  Balance Overall balance assessment: Needs assistance Sitting-balance support: Bilateral upper extremity supported Sitting balance-Leahy Scale: Fair Standing balance support: Bilateral upper extremity supported Standing balance-Leahy Scale: Poor Standing balance comment: Pt unable to commit to standing due to pain and weakness. General Comments General comments (skin integrity, edema, etc.): Pt was slow to follow commands and req multiple verbal cues for hand placement, safety, and sequency  End of Session PT - End of Session Equipment Utilized During Treatment: Gait belt Activity Tolerance: Patient limited by fatigue;Patient limited by pain Patient left: in bed;with call bell/phone within reach Nurse Communication: Mobility status   GP     Enaya Howze SPT 11/20/2013, 5:39 PM

## 2013-11-21 ENCOUNTER — Encounter (HOSPITAL_COMMUNITY): Payer: Self-pay | Admitting: Certified Registered"

## 2013-11-21 ENCOUNTER — Inpatient Hospital Stay (HOSPITAL_COMMUNITY): Payer: Medicaid Other | Admitting: Certified Registered"

## 2013-11-21 ENCOUNTER — Inpatient Hospital Stay (HOSPITAL_COMMUNITY): Payer: Medicaid Other

## 2013-11-21 ENCOUNTER — Encounter (HOSPITAL_COMMUNITY): Payer: Medicaid Other | Admitting: Certified Registered"

## 2013-11-21 ENCOUNTER — Encounter (HOSPITAL_COMMUNITY)
Admission: EM | Disposition: A | Discharge status: Skilled Nursing Facility | Payer: Self-pay | Source: Home / Self Care | Attending: Internal Medicine

## 2013-11-21 DIAGNOSIS — G8918 Other acute postprocedural pain: Secondary | ICD-10-CM

## 2013-11-21 HISTORY — PX: LUNG BIOPSY: SHX5088

## 2013-11-21 HISTORY — PX: VIDEO ASSISTED THORACOSCOPY: SHX5073

## 2013-11-21 HISTORY — PX: VIDEO BRONCHOSCOPY: SHX5072

## 2013-11-21 LAB — BASIC METABOLIC PANEL
BUN: 15 mg/dL (ref 6–23)
CO2: 26 mEq/L (ref 19–32)
Calcium: 8.8 mg/dL (ref 8.4–10.5)
Chloride: 100 mEq/L (ref 96–112)
Creatinine, Ser: 0.63 mg/dL (ref 0.50–1.10)
GFR calc Af Amer: >90 mL/min (ref >90)
Glucose, Bld: 80 mg/dL (ref 70–99)
POTASSIUM: 3.8 meq/L (ref 3.7–5.3)
Sodium: 137 mEq/L (ref 137–147)

## 2013-11-21 LAB — MRSA PCR SCREENING: MRSA by PCR: NEGATIVE

## 2013-11-21 LAB — JO-1 ANTIBODY-IGG: Jo-1 Antibody, IgG: <1

## 2013-11-21 LAB — ANTI-SCLERODERMA ANTIBODY: Scleroderma (Scl-70) (ENA) Antibody, IgG: <1

## 2013-11-21 LAB — MAGNESIUM: Magnesium: 1.6 mg/dL (ref 1.5–2.5)

## 2013-11-21 SURGERY — BRONCHOSCOPY, VIDEO-ASSISTED
Anesthesia: General | Site: Chest

## 2013-11-21 MED ORDER — PHENYLEPHRINE 40 MCG/ML (10ML) SYRINGE FOR IV PUSH (FOR BLOOD PRESSURE SUPPORT)
PREFILLED_SYRINGE | INTRAVENOUS | Status: AC
Start: 2013-11-21 — End: 2013-11-21
  Filled 2013-11-21: qty 10

## 2013-11-21 MED ORDER — GLYCOPYRROLATE 0.2 MG/ML IJ SOLN
INTRAMUSCULAR | Status: DC | PRN
  Administered 2013-11-21: .6 mg via INTRAVENOUS

## 2013-11-21 MED ORDER — MORPHINE SULFATE 2 MG/ML IJ SOLN
INTRAMUSCULAR | Status: AC
  Administered 2013-11-21: 2 mg via INTRAMUSCULAR
  Filled 2013-11-21: qty 2

## 2013-11-21 MED ORDER — PROPOFOL 10 MG/ML IV BOLUS
INTRAVENOUS | Status: AC
  Filled 2013-11-21: qty 20

## 2013-11-21 MED ORDER — NEOSTIGMINE METHYLSULFATE 1 MG/ML IJ SOLN
INTRAMUSCULAR | Status: AC
  Filled 2013-11-21: qty 10

## 2013-11-21 MED ORDER — MORPHINE SULFATE 2 MG/ML IJ SOLN
2.0000 mg | INTRAMUSCULAR | Status: AC
  Administered 2013-11-21: 2 mg via INTRAVENOUS

## 2013-11-21 MED ORDER — OXYCODONE-ACETAMINOPHEN 5-325 MG PO TABS
1.0000 | ORAL_TABLET | ORAL | Status: DC | PRN
  Filled 2013-11-21: qty 1
  Filled 2013-11-21: qty 2
  Filled 2013-11-21 (×2): qty 1

## 2013-11-21 MED ORDER — PHENYLEPHRINE HCL 10 MG/ML IJ SOLN
INTRAMUSCULAR | Status: DC | PRN
  Administered 2013-11-21: 80 ug via INTRAVENOUS

## 2013-11-21 MED ORDER — NALOXONE HCL 0.4 MG/ML IJ SOLN: 0.4000 mg | INTRAMUSCULAR | Status: DC | PRN

## 2013-11-21 MED ORDER — ONDANSETRON HCL 4 MG/2ML IJ SOLN
INTRAMUSCULAR | Status: AC
Start: 2013-11-21 — End: 2013-11-21
  Filled 2013-11-21: qty 2

## 2013-11-21 MED ORDER — ROCURONIUM BROMIDE 100 MG/10ML IV SOLN
INTRAVENOUS | Status: DC | PRN
Start: 2013-11-21 — End: 2013-11-21
  Administered 2013-11-21: 10 mg via INTRAVENOUS
  Administered 2013-11-21: 40 mg via INTRAVENOUS

## 2013-11-21 MED ORDER — MORPHINE SULFATE 2 MG/ML IJ SOLN
INTRAMUSCULAR | Status: AC
  Filled 2013-11-21: qty 1

## 2013-11-21 MED ORDER — PROPOFOL 10 MG/ML IV BOLUS
INTRAVENOUS | Status: DC | PRN
  Administered 2013-11-21: 100 mg via INTRAVENOUS
  Administered 2013-11-21: 50 mg via INTRAVENOUS

## 2013-11-21 MED ORDER — ACETAMINOPHEN 160 MG/5ML PO SOLN: 1000.0000 mg | Freq: Four times a day (QID) | ORAL | Status: AC

## 2013-11-21 MED ORDER — MAGNESIUM SULFATE 40 MG/ML IJ SOLN
2.0000 g | Freq: Once | INTRAMUSCULAR | Status: AC
  Administered 2013-11-21: 2 g via INTRAVENOUS
  Filled 2013-11-21: qty 50

## 2013-11-21 MED ORDER — BISACODYL 5 MG PO TBEC
10.0000 mg | DELAYED_RELEASE_TABLET | Freq: Every day | ORAL | Status: DC
  Filled 2013-11-21 (×4): qty 2

## 2013-11-21 MED ORDER — OXYCODONE HCL 5 MG PO TABS
5.0000 mg | ORAL_TABLET | ORAL | Status: AC | PRN
  Filled 2013-11-21: qty 1

## 2013-11-21 MED ORDER — FENTANYL CITRATE 0.05 MG/ML IJ SOLN
INTRAMUSCULAR | Status: AC
  Filled 2013-11-21: qty 5

## 2013-11-21 MED ORDER — SODIUM CHLORIDE 0.9 % IJ SOLN: 9.0000 mL | INTRAMUSCULAR | Status: DC | PRN

## 2013-11-21 MED ORDER — MIDAZOLAM HCL 2 MG/2ML IJ SOLN
INTRAMUSCULAR | Status: AC
  Filled 2013-11-21: qty 2

## 2013-11-21 MED ORDER — CEFUROXIME SODIUM 1.5 G IJ SOLR
1.5000 g | Freq: Two times a day (BID) | INTRAMUSCULAR | Status: AC
  Administered 2013-11-21 – 2013-11-22 (×2): 1.5 g via INTRAVENOUS
  Filled 2013-11-21 (×2): qty 1.5

## 2013-11-21 MED ORDER — ONDANSETRON HCL 4 MG/2ML IJ SOLN: 4.0000 mg | Freq: Once | INTRAMUSCULAR | Status: DC | PRN

## 2013-11-21 MED ORDER — GLYCOPYRROLATE 0.2 MG/ML IJ SOLN
INTRAMUSCULAR | Status: AC
  Filled 2013-11-21: qty 3

## 2013-11-21 MED ORDER — MIDAZOLAM HCL 5 MG/5ML IJ SOLN
INTRAMUSCULAR | Status: DC | PRN
  Administered 2013-11-21 (×2): 2 mg via INTRAVENOUS

## 2013-11-21 MED ORDER — TRAMADOL HCL 50 MG PO TABS
50.0000 mg | ORAL_TABLET | Freq: Four times a day (QID) | ORAL | Status: DC | PRN
  Administered 2013-11-21 – 2013-11-28 (×21): 100 mg via ORAL
  Filled 2013-11-21 (×21): qty 2

## 2013-11-21 MED ORDER — ACETAMINOPHEN 500 MG PO TABS
1000.0000 mg | ORAL_TABLET | Freq: Four times a day (QID) | ORAL | Status: AC
Start: 2013-11-21 — End: 2013-11-22
  Administered 2013-11-21 – 2013-11-22 (×4): 1000 mg via ORAL
  Filled 2013-11-21 (×2): qty 2

## 2013-11-21 MED ORDER — DIPHENHYDRAMINE HCL 12.5 MG/5ML PO ELIX
12.5000 mg | ORAL_SOLUTION | Freq: Four times a day (QID) | ORAL | Status: DC | PRN
  Filled 2013-11-21: qty 5

## 2013-11-21 MED ORDER — PROPOFOL 10 MG/ML IV BOLUS
INTRAVENOUS | Status: AC
Start: 2013-11-21 — End: 2013-11-21
  Filled 2013-11-21: qty 20

## 2013-11-21 MED ORDER — 0.9 % SODIUM CHLORIDE (POUR BTL) OPTIME
TOPICAL | Status: DC | PRN
  Administered 2013-11-21: 2000 mL

## 2013-11-21 MED ORDER — ARTIFICIAL TEARS OP OINT
TOPICAL_OINTMENT | OPHTHALMIC | Status: DC | PRN
  Administered 2013-11-21: 1 via OPHTHALMIC

## 2013-11-21 MED ORDER — FENTANYL 10 MCG/ML IV SOLN
INTRAVENOUS | Status: DC
  Administered 2013-11-21: 16:00:00 via INTRAVENOUS
  Administered 2013-11-21: 300 ug via INTRAVENOUS
  Administered 2013-11-21: 165 ug via INTRAVENOUS
  Administered 2013-11-22: 210 ug via INTRAVENOUS
  Administered 2013-11-22: 312 ug via INTRAVENOUS
  Administered 2013-11-22: 210 ug via INTRAVENOUS
  Administered 2013-11-22: 194 ug via INTRAVENOUS
  Administered 2013-11-22 – 2013-11-23 (×2): via INTRAVENOUS
  Administered 2013-11-23: 30 ug via INTRAVENOUS
  Administered 2013-11-23: 210 ug via INTRAVENOUS
  Administered 2013-11-23: 208 ug via INTRAVENOUS
  Administered 2013-11-23: 120 ug via INTRAVENOUS
  Administered 2013-11-23: 210 ug via INTRAVENOUS
  Administered 2013-11-24: 14:00:00 via INTRAVENOUS
  Administered 2013-11-24: 90 ug via INTRAVENOUS
  Administered 2013-11-24: 210 ug via INTRAVENOUS
  Administered 2013-11-24: 270 ug via INTRAVENOUS
  Administered 2013-11-24: 147.2 ug via INTRAVENOUS
  Administered 2013-11-24: 15 ug via INTRAVENOUS
  Administered 2013-11-25: 75 ug via INTRAVENOUS
  Administered 2013-11-25: 146.3 ug via INTRAVENOUS
  Administered 2013-11-25: 90 ug via INTRAVENOUS
  Filled 2013-11-21 (×8): qty 50

## 2013-11-21 MED ORDER — LACTATED RINGERS IV SOLN
INTRAVENOUS | Status: DC
  Administered 2013-11-21: 09:00:00 via INTRAVENOUS

## 2013-11-21 MED ORDER — KCL IN DEXTROSE-NACL 20-5-0.45 MEQ/L-%-% IV SOLN
INTRAVENOUS | Status: DC
  Administered 2013-11-21: 18:00:00 via INTRAVENOUS
  Filled 2013-11-21 (×5): qty 1000

## 2013-11-21 MED ORDER — NEOSTIGMINE METHYLSULFATE 1 MG/ML IJ SOLN
INTRAMUSCULAR | Status: DC | PRN
  Administered 2013-11-21: 3 mg via INTRAVENOUS

## 2013-11-21 MED ORDER — ONDANSETRON HCL 4 MG/2ML IJ SOLN
INTRAMUSCULAR | Status: DC | PRN
  Administered 2013-11-21: 4 mg via INTRAVENOUS

## 2013-11-21 MED ORDER — SENNOSIDES-DOCUSATE SODIUM 8.6-50 MG PO TABS
1.0000 | ORAL_TABLET | Freq: Every evening | ORAL | Status: DC | PRN
  Filled 2013-11-21: qty 1

## 2013-11-21 MED ORDER — ONDANSETRON HCL 4 MG/2ML IJ SOLN: 4.0000 mg | Freq: Four times a day (QID) | INTRAMUSCULAR | Status: DC | PRN

## 2013-11-21 MED ORDER — DIPHENHYDRAMINE HCL 50 MG/ML IJ SOLN
12.5000 mg | Freq: Four times a day (QID) | INTRAMUSCULAR | Status: DC | PRN
  Administered 2013-11-22 – 2013-11-23 (×2): 12.5 mg via INTRAVENOUS
  Filled 2013-11-21 (×2): qty 1

## 2013-11-21 MED ORDER — OXYCODONE-ACETAMINOPHEN 5-325 MG PO TABS: 1.0000 | ORAL_TABLET | ORAL | Status: DC | PRN

## 2013-11-21 MED ORDER — MORPHINE SULFATE 2 MG/ML IJ SOLN
INTRAMUSCULAR | Status: AC
  Administered 2013-11-21: 2 mg via INTRAMUSCULAR
  Filled 2013-11-21: qty 1

## 2013-11-21 MED ORDER — IPRATROPIUM-ALBUTEROL 0.5-2.5 (3) MG/3ML IN SOLN
3.0000 mL | Freq: Four times a day (QID) | RESPIRATORY_TRACT | Status: DC
  Administered 2013-11-21: 3 mL via RESPIRATORY_TRACT
  Filled 2013-11-21: qty 3

## 2013-11-21 MED ORDER — FENTANYL CITRATE 0.05 MG/ML IJ SOLN
INTRAMUSCULAR | Status: DC | PRN
  Administered 2013-11-21 (×3): 50 ug via INTRAVENOUS
  Administered 2013-11-21: 100 ug via INTRAVENOUS
  Administered 2013-11-21 (×5): 50 ug via INTRAVENOUS

## 2013-11-21 MED ORDER — DEXMEDETOMIDINE HCL IN NACL 200 MCG/50ML IV SOLN
0.4000 ug/kg/h | INTRAVENOUS | Status: DC
  Filled 2013-11-21: qty 50

## 2013-11-21 MED ORDER — POTASSIUM CHLORIDE 10 MEQ/50ML IV SOLN
10.0000 meq | Freq: Every day | INTRAVENOUS | Status: DC | PRN
  Administered 2013-11-25 (×3): 10 meq via INTRAVENOUS
  Filled 2013-11-21 (×2): qty 50

## 2013-11-21 MED ORDER — DEXMEDETOMIDINE BOLUS VIA INFUSION: 1.0000 ug/kg | Freq: Once | INTRAVENOUS | Status: DC

## 2013-11-21 MED ORDER — LACTATED RINGERS IV SOLN
INTRAVENOUS | Status: DC | PRN
  Administered 2013-11-21: 11:00:00 via INTRAVENOUS

## 2013-11-21 SURGICAL SUPPLY — 75 items
ADH SKN CLS APL DERMABOND .7 (GAUZE/BANDAGES/DRESSINGS) ×2
APL SRG 22X2 LUM MLBL SLNT (VASCULAR PRODUCTS)
APPLICATOR TIP COSEAL (VASCULAR PRODUCTS) IMPLANT
APPLICATOR TIP EXT COSEAL (VASCULAR PRODUCTS) IMPLANT
BLADE SURG 11 STRL SS (BLADE) IMPLANT
BRUSH CYTOL CELLEBRITY 1.5X140 (MISCELLANEOUS) IMPLANT
CANISTER SUCTION 2500CC (MISCELLANEOUS) ×3 IMPLANT
CATH KIT ON Q 5IN SLV (PAIN MANAGEMENT) IMPLANT
CATH THORACIC 28FR (CATHETERS) ×6 IMPLANT
CATH THORACIC 36FR (CATHETERS) IMPLANT
CATH THORACIC 36FR RT ANG (CATHETERS) IMPLANT
CLIP TI MEDIUM 6 (CLIP) IMPLANT
CONN ST 1/4X3/8  BEN (MISCELLANEOUS) ×2
CONN ST 1/4X3/8 BEN (MISCELLANEOUS) ×4 IMPLANT
CONT SPEC 4OZ CLIKSEAL STRL BL (MISCELLANEOUS) ×18 IMPLANT
COVER TABLE BACK 60X90 (DRAPES) ×6 IMPLANT
DERMABOND ADVANCED (GAUZE/BANDAGES/DRESSINGS) ×1
DERMABOND ADVANCED .7 DNX12 (GAUZE/BANDAGES/DRESSINGS) ×2 IMPLANT
DRAPE LAPAROSCOPIC ABDOMINAL (DRAPES) ×3 IMPLANT
DRAPE WARM FLUID 44X44 (DRAPE) ×3 IMPLANT
DRILL BIT 7/64X5 (BIT) IMPLANT
ELECT BLADE 4.0 EZ CLEAN MEGAD (MISCELLANEOUS) ×3
ELECT REM PT RETURN 9FT ADLT (ELECTROSURGICAL) ×3
ELECTRODE BLDE 4.0 EZ CLN MEGD (MISCELLANEOUS) ×2 IMPLANT
ELECTRODE REM PT RTRN 9FT ADLT (ELECTROSURGICAL) ×2 IMPLANT
FORCEPS BIOP RJ4 1.8 (CUTTING FORCEPS) IMPLANT
GLOVE BIO SURGEON STRL SZ 6 (GLOVE) ×3 IMPLANT
GLOVE BIO SURGEON STRL SZ 6.5 (GLOVE) ×15 IMPLANT
GOWN STRL REUS W/ TWL LRG LVL3 (GOWN DISPOSABLE) ×6 IMPLANT
GOWN STRL REUS W/TWL LRG LVL3 (GOWN DISPOSABLE) ×9
HANDLE STAPLE ENDO GIA SHORT (STAPLE) ×1
KIT BASIN OR (CUSTOM PROCEDURE TRAY) ×3 IMPLANT
KIT ROOM TURNOVER OR (KITS) ×3 IMPLANT
KIT SUCTION CATH 14FR (SUCTIONS) ×3 IMPLANT
MARKER SKIN DUAL TIP RULER LAB (MISCELLANEOUS) ×3 IMPLANT
NEEDLE BIOPSY TRANSBRONCH 21G (NEEDLE) IMPLANT
NS IRRIG 1000ML POUR BTL (IV SOLUTION) ×6 IMPLANT
OIL SILICONE PENTAX (PARTS (SERVICE/REPAIRS)) ×3 IMPLANT
PACK CHEST (CUSTOM PROCEDURE TRAY) ×3 IMPLANT
PAD ARMBOARD 7.5X6 YLW CONV (MISCELLANEOUS) ×6 IMPLANT
PASSER SUT SWANSON 36MM LOOP (INSTRUMENTS) IMPLANT
RELOAD EGIA 45 MED/THCK PURPLE (STAPLE) ×18 IMPLANT
SEALANT PROGEL (MISCELLANEOUS) IMPLANT
SEALANT SURG COSEAL 4ML (VASCULAR PRODUCTS) IMPLANT
SEALANT SURG COSEAL 8ML (VASCULAR PRODUCTS) IMPLANT
SOLUTION ANTI FOG 6CC (MISCELLANEOUS) ×6 IMPLANT
SPONGE GAUZE 4X4 12PLY (GAUZE/BANDAGES/DRESSINGS) ×3 IMPLANT
STAPLER ENDO GIA 12MM SHORT (STAPLE) ×2 IMPLANT
SUT PROLENE 3 0 SH DA (SUTURE) IMPLANT
SUT PROLENE 4 0 RB 1 (SUTURE)
SUT PROLENE 4-0 RB1 .5 CRCL 36 (SUTURE) IMPLANT
SUT SILK  1 MH (SUTURE) ×5
SUT SILK 1 MH (SUTURE) ×10 IMPLANT
SUT SILK 2 0SH CR/8 30 (SUTURE) IMPLANT
SUT SILK 3 0SH CR/8 30 (SUTURE) IMPLANT
SUT VIC AB 1 CTX 18 (SUTURE) IMPLANT
SUT VIC AB 1 CTX 36 (SUTURE)
SUT VIC AB 1 CTX36XBRD ANBCTR (SUTURE) IMPLANT
SUT VIC AB 2-0 CTX 36 (SUTURE) IMPLANT
SUT VIC AB 2-0 UR6 27 (SUTURE) ×6 IMPLANT
SUT VIC AB 3-0 X1 27 (SUTURE) ×3 IMPLANT
SUT VICRYL 2 TP 1 (SUTURE) IMPLANT
SWAB COLLECTION DEVICE MRSA (MISCELLANEOUS) IMPLANT
SYR 20ML ECCENTRIC (SYRINGE) ×3 IMPLANT
SYR 30ML LL (SYRINGE) ×3 IMPLANT
SYSTEM SAHARA CHEST DRAIN ATS (WOUND CARE) ×3 IMPLANT
TAPE CLOTH 4X10 WHT NS (GAUZE/BANDAGES/DRESSINGS) ×3 IMPLANT
TIP APPLICATOR SPRAY EXTEND 16 (VASCULAR PRODUCTS) IMPLANT
TOWEL OR 17X24 6PK STRL BLUE (TOWEL DISPOSABLE) ×3 IMPLANT
TOWEL OR 17X26 10 PK STRL BLUE (TOWEL DISPOSABLE) ×6 IMPLANT
TRAP SPECIMEN MUCOUS 40CC (MISCELLANEOUS) ×6 IMPLANT
TRAY FOLEY CATH 14FRSI W/METER (CATHETERS) ×3 IMPLANT
TUBE ANAEROBIC SPECIMEN COL (MISCELLANEOUS) IMPLANT
TUBE CONNECTING 12X1/4 (SUCTIONS) ×6 IMPLANT
WATER STERILE IRR 1000ML POUR (IV SOLUTION) ×6 IMPLANT

## 2013-11-21 NOTE — Progress Notes (Signed)
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 Just discharged on 2/19 w/ recommendations for steroid taper w/ suggestion that she would need Open lung bx should this re-occur. At time of d/c her CXR was essentially clear. She was re-admitted on 3/3 w/ CC: 1 d/ h/o chest pain and progressive dyspnea. CXR w/ reoccurrence of bilateral pulmonary infiltrates. PCCM was asked to eval.    SIGNIFICANT EVENTS:  STUDIES:  1/28 - CTA chest>>>No PE. Bilateral prominently upper lobe mixed airspace and ground-glass opacities, multi lobar pneumonia is favored. Mediastinal and bilateral hilar adenopathy  1/28 - Bronchoscopy >>> 6688 WBC (93% neutrophils), Cx negative 1/28 - TTE >>> EF 65-70%, grade 1 DD, PA peak 38, no change from prior   1/28 - Labs >>> P ANCA 1:20, ANA 1:80, DS DNA 1, RF 13, ESR 98 2/07 - Labs >>> CPK 31, SS-A < 1, SS-B < 1, ACE 14 2/09 - Labs >>> P ANCA 1:20, MPO 1.1 2/15 - Chest CT >>> No pulmonary embolism, persistent upper lobe predominant bilateral airspace and ground-glass opacities, improved relative to most recent CT from 1/28, mediastinal and bilateral adenopathy 2/16 Echo  >>> EF 40%, grade 1 systolic dysfunction  .................................................................................................................................................... 3/03 - Admit with CP & Progressive dyspnea.  CT chest >>> diffuse b/l GGO with upper lobe predominance  LINES / TUBES: OETT 3/6>>> Aline 3/6>>>  CULTURES: Blood 3/03 >>>   ANTIBIOTICS: Cefepime 3/3 x1 vanc 3/3 >>>3/5 Flagyl 3/3 >>>3/05 Cefuroxime 3/5>>>for OR  SUBJECTIVE: returned to ICU post VATS on vent   VITAL  SIGNS: Temp:  [96.4 F (35.8 C)-99.3 F (37.4 C)] 96.5 F (35.8 C) (03/06 1520) Pulse Rate:  [57-102] 59 (03/06 1520) Resp:  [16-40] 24 (03/06 1520) BP: (77-149)/(57-91) 122/72 mmHg (03/06 1520) SpO2:  [96 %-100 %] 100 % (03/06 1520) Arterial Line BP: (93-181)/(56-177) 130/66 mmHg (03/06 1520) Weight:  [121 lb 8 oz (55.112 kg)] 121 lb 8 oz (55.112 kg) (03/06 1520)  PHYSICAL EXAMINATION: General:  Chronically ill appearing Neuro: normal strength, generalized weakness HEENT: no sinus tenderness Cardiovascular: regular, tachycardic Lungs:  Dry crackles bases Abdomen:  Soft, mild tenderness Musculoskeletal: 1+ edema Skin: no rashes   PULMONARY  Recent Labs Lab 11/20/13 2123 11/21/13 1423  PHART 7.477* 7.413  PCO2ART 38.1 42.9  PO2ART 59.7* 178.0*  HCO3 27.8* 27.3*  TCO2 29.0 28.7  O2SAT 90.9 100.0    CBC  Recent Labs Lab 11/19/13 0250 11/20/13 0215 11/20/13 2204  HGB 11.5* 11.0* 10.2*  HCT 36.4 34.2* 31.5*  WBC 14.9* 12.3* 12.4*  PLT 340 418* 424*    COAGULATION  Recent Labs Lab 11/18/13 1657 11/20/13 2204  INR 1.43 1.26    CARDIAC   Recent Labs Lab 11/18/13 1659 11/18/13 2300  TROPONINI <0.30 <0.30    Recent Labs Lab 11/18/13 0945  PROBNP 585.4*     CHEMISTRY  Recent Labs Lab 11/19/13 0250 11/20/13 0215 11/20/13 1145 11/20/13 2204 11/21/13 0245  NA 141 143 140 138 137  K 3.5* 2.9* 3.6* 3.8 3.8  CL 98 100 101 100 100  CO2 '26 30 28 26 26  ' GLUCOSE 138* 183* 155* 103* 80  BUN '7 14 15 16 15  ' CREATININE 0.47* 0.62 0.68  0.61 0.63  CALCIUM 9.5 9.2 8.8 9.3 8.8  MG  --  1.8  --   --  1.6   Estimated Creatinine Clearance: 62.9 ml/min (by C-G formula based on Cr of 0.63).   LIVER  Recent Labs Lab 11/18/13 0945 11/18/13 1657 11/20/13 2204  AST 16  --  8  ALT 7  --  5  ALKPHOS 119*  --  81  BILITOT 0.5  --  0.2*  PROT 7.9  --  6.9  ALBUMIN 2.5*  --  2.5*  INR  --  1.43 1.26     INFECTIOUS  Recent Labs Lab  11/18/13 0958 11/20/13 1145  LATICACIDVEN 1.93  --   PROCALCITON  --  0.20     ENDOCRINE CBG (last 3)  No results found for this basename: GLUCAP,  in the last 72 hours       IMAGING x48h  Dg Chest Portable 1 View  11/21/2013   CLINICAL DATA:  Status post VATS  EXAM: PORTABLE CHEST - 1 VIEW  COMPARISON:  11/18/2013  FINDINGS: Two new chest tubes are noted on the left. No definitive pneumothorax is seen. A left-sided central venous line is seen with the tip in the mid superior vena cava. Patchy changes are noted bilaterally but improved when compared with the prior exam. The cardiac shadow is stable. No bony abnormality is seen.  IMPRESSION: Improved infiltrative changes bilaterally.  Status post VATS without evidence of pneumothorax. Chest tubes are noted in place   Electronically Signed   By: Inez Catalina M.D.   On: 11/21/2013 14:34      ASSESSMENT / PLAN:  Recurrent pneumonitis of unclear etiology -  partially responsive to steroids.  Lab evaluation non-specific to date. Acute Respiratory Failure - post VATS 3/6 Pleuritic Chest Pain   P: -continue solumedrol 80 Q8 -follow VATS cultures / biopsy results -wean oxygen for sats >93% -continue bronchodilators -fentanyl PCA for pain  -push pulmonary hygiene -NTS PRN - Bipsy result pending'; will take 1 week atleast due to send out to Dr Dian Situ - OPD fu with Dr Chase Caller as previously arranged  Hypokalemia Hypomagnesemia  P: -monitor BMP / Mg -replace lytes as indicated  UTI - post rocephin IV Hx of Recent C-Diff Infection - Resolved.   P: -abx as above -continue florastor -continue pyridium  GERD Protein Calorie Malnutrition  Chronic Epigastric Pain - thought related to gastritis Nausea / Vomiting Constipation   P: -PPI -bowel regimen-->miralax, senokot -NPO until strong cough, and voice clear with good mental status (awake) -zofran for nausea  Chronic Pain  Fibromyalgia Migraine Headaches    P: -continue lyrica, keppra, imitrex -fentanyl PCA -PRN Oxy IR,  Percocet and ultram  Anemia   P: -SCD's for DVT proph -continue B12  GLOBAL: -PCCM SVC while in ICU, will go back to IM TS post transfer from ICU. PCCM will continue to follow -will need PT consult once cleared from [place], NP-C [facility] Pgr: [phone] or [phone]   STAFF NOTE: I, Dr Ann Lions have personally reviewed patient's available data, including medical history, events of note, physical examination and test results as part of my evaluation. I have discussed with resident/NP and other care providers such as pharmacist, RN and RRT.  In addition,  I personally evaluated patient and elicited key findings of Acute/subacute ILD NOS with neutrophilia. S/p VATs lung bx. Results will take a week due to send out to [place] - Dr Vicente Males  Katzenstein. Currently pain mgmt and medical care. Continue steroids.  Rest per NP/medical resident whose note is outlined above and that I agree with    Dr. Brand Males, M.D., [facility].C.P Pulmonary and Critical Care Medicine Staff Physician [facility] Pager: [phone], If no answer or between  15:00h - 7:00h: call [phone]  11/21/2013 4:57 PM

## 2013-11-21 NOTE — Anesthesia Procedure Notes (Signed)
Procedure Name: Intubation Date/Time: 11/21/2013 10:40 AM Performed by: Lanell MatarBAKER, Armand Preast M Pre-anesthesia Checklist: Patient identified, Timeout performed, Emergency Drugs available, Suction available and Patient being monitored Patient Re-evaluated:Patient Re-evaluated prior to inductionOxygen Delivery Method: Circle system utilized Preoxygenation: Pre-oxygenation with 100% oxygen Intubation Type: IV induction Ventilation: Mask ventilation without difficulty Laryngoscope Size: Miller and 2 Grade View: Grade I Tube type: Oral Tube size: 8.5 mm Number of attempts: 1 Airway Equipment and Method: Stylet Placement Confirmation: ETT inserted through vocal cords under direct vision,  positive ETCO2,  CO2 detector and breath sounds checked- equal and bilateral Secured at: 22 cm Tube secured with: Tape Dental Injury: Teeth and Oropharynx as per pre-operative assessment

## 2013-11-21 NOTE — Brief Op Note (Addendum)
      [address]       [place] [id number]             [phone]       11/21/2013  12:37 PM  PATIENT:  [name] [name]  53 y.o. female  PRE-OPERATIVE DIAGNOSIS:  PNEUMONITIS, RECURRENT PNEUMONIA  POST-OPERATIVE DIAGNOSIS:  PNEUMONITIS, RECURRENT PNEUMONIA  PROCEDURE:   VIDEO BRONCHOSCOPY LEFT VIDEO ASSISTED THORACOSCOPY LEFT UPPER LOBE BIOPSIES x 2, LEFT LOWER LOBE BIOPSY  SURGEON:  Delight OvensEdward B Jalyn Dutta, MD  ASSISTANT: Coral CeoGina Collins, PA-C  ANESTHESIA:   general  SPECIMEN:  Source of Specimen:  Left upper lower lobe biopsies  DISPOSITION OF SPECIMEN:  Pathology and tissue culture  DRAINS: 28 Fr Blake drains x 2  PATIENT CONDITION:  PACU - hemodynamically stable.

## 2013-11-21 NOTE — Transfer of Care (Signed)
Immediate Anesthesia Transfer of Care Note  Patient: [name] [name]  Procedure(s) Performed: Procedure(s): VIDEO BRONCHOSCOPY (N/A) VIDEO ASSISTED THORACOSCOPY (Left) LUNG BIOPSY (Left)  Patient Location: PACU  Anesthesia Type:General  Level of Consciousness: awake, alert  and oriented  Airway & Oxygen Therapy: Patient Spontanous BreathingPt placed on BIPAP  Post-op Assessment: Report given to PACU RN, Post -op Vital signs reviewed and stable and Patient moving all extremities X 4  Post vital signs: Reviewed and stable  Complications: No apparent anesthesia complications

## 2013-11-21 NOTE — Progress Notes (Signed)
Dr Noreene LarssonJoslin at bedside.  Aline manipulated to achieve normal waveform with success.  He states pt will need ICU bed

## 2013-11-21 NOTE — Anesthesia Postprocedure Evaluation (Signed)
  Anesthesia Post-op Note  Patient: [name] [name]  Procedure(s) Performed: Procedure(s): VIDEO BRONCHOSCOPY (N/A) VIDEO ASSISTED THORACOSCOPY (Left) LUNG BIOPSY (Left)  Patient Location: PACU  Anesthesia Type:General  Level of Consciousness: awake, alert  and oriented  Airway and Oxygen Therapy: Patient Spontanous Breathing and Patient connected to nasal cannula oxygen  Post-op Pain: moderate  Post-op Assessment: Post-op Vital signs reviewed, Patient's Cardiovascular Status Stable, Respiratory Function Stable, Patent Airway and No signs of Nausea or vomiting  Post-op Vital Signs: stable  Complications: No apparent anesthesia complications

## 2013-11-21 NOTE — Progress Notes (Signed)
Dr Noreene Larssonjoslin gave Precedex bolus

## 2013-11-21 NOTE — Progress Notes (Signed)
  Date: 11/21/2013  Patient name: [name] [name]  Medical record number: [id number]  Date of birth: 05/20/1961   This patient has been seen and the plan of care was discussed with the house staff. Please see their note for complete details. I concur with their findings with the following additions/corrections: She is s/p VATS with left upper lobe biopsies. Will need to await results. Continue solumedrol. She has a steroid responsive pneumonitis. Pulmonary following.  Jonah BlueAlejandro Medard Decuir, DO, FACP Faculty [facility] 11/21/2013, 3:46 PM

## 2013-11-21 NOTE — Progress Notes (Signed)
[facility] BRIEF SICU PROGRESS NOTE  Day of Surgery  S/P Procedure(s) (LRB): VIDEO BRONCHOSCOPY (N/A) VIDEO ASSISTED THORACOSCOPY (Left) LUNG BIOPSY (Left)   No complaints Good analgesia Breathing comfortably No air leak  Plan: Continue routine early postop  OWEN,CLARENCE H 11/21/2013 6:48 PM

## 2013-11-21 NOTE — Progress Notes (Signed)
Subjective: She underwent VATS with left upper lobe biopsies today with no complications.   Objective: Vital signs in last 24 hours: Filed Vitals:   11/21/13 1630 11/21/13 1645 11/21/13 1700 11/21/13 1800  BP:   120/64 109/64  Pulse: 52 62 56 47  Temp:      TempSrc:      Resp: 16 23 14 16   Height:      Weight:      SpO2: 100% 100% 100% 99%   Weight change:   Intake/Output Summary (Last 24 hours) at 11/21/13 1837 Last data filed at 11/21/13 1800  Gross per 24 hour  Intake 1381.67 ml  Output   1072 ml  Net 309.67 ml   Vitals reviewed. General: Examined in PACU, resting in bed Cardiac: tachycardia, no rubs, murmurs or gallops Pulm: mild respiratory distress, chest tube in place, wearing BiPAP Abd: soft, nontender, nondistended, BS present Ext: warm and well perfused, no pedal edema Neuro: alert and oriented X3, moves 4 extremities voluntarily  Lab Results: Basic Metabolic Panel:  Recent Labs Lab 11/20/13 0215  11/20/13 2204 11/21/13 0245  NA 143  < > 138 137  K 2.9*  < > 3.8 3.8  CL 100  < > 100 100  CO2 30  < > 26 26  GLUCOSE 183*  < > 103* 80  BUN 14  < > 16 15  CREATININE 0.62  < > 0.61 0.63  CALCIUM 9.2  < > 9.3 8.8  MG 1.8  --   --  1.6  < > = values in this interval not displayed. Liver Function Tests:  Recent Labs Lab 11/18/13 0945 11/20/13 2204  AST 16 8  ALT 7 5  ALKPHOS 119* 81  BILITOT 0.5 0.2*  PROT 7.9 6.9  ALBUMIN 2.5* 2.5*   CBC:  Recent Labs Lab 11/18/13 0945  11/20/13 0215 11/20/13 2204  WBC 31.2*  < > 12.3* 12.4*  NEUTROABS 28.4*  --   --   --   HGB 11.2*  < > 11.0* 10.2*  HCT 35.6*  < > 34.2* 31.5*  MCV 89.0  < > 86.6 85.1  PLT 366  < > 418* 424*  < > = values in this interval not displayed. Cardiac Enzymes:  Recent Labs Lab 11/18/13 1659 11/18/13 2300  TROPONINI <0.30 <0.30   BNP:  Recent Labs Lab 11/18/13 0945  PROBNP 585.4*   D-Dimer:  Recent Labs Lab 11/18/13 0945  DDIMER 2.23*    Coagulation:  Recent Labs Lab 11/18/13 1657 11/20/13 2204  LABPROT 17.1* 15.5*  INR 1.43 1.26   Urine Drug Screen: Drugs of Abuse     Component Value Date/Time   LABOPIA NONE DETECTED 10/07/2013 0018   COCAINSCRNUR NONE DETECTED 10/07/2013 0018   LABBENZ NONE DETECTED 10/07/2013 0018   AMPHETMU NONE DETECTED 10/07/2013 0018   THCU NONE DETECTED 10/07/2013 0018   LABBARB NONE DETECTED 10/07/2013 0018    Urinalysis:  Recent Labs Lab 11/20/13 0119 11/20/13 1951  COLORURINE AMBER* ORANGE*  LABSPEC 1.033* 1.033*  PHURINE 6.0 6.0  GLUCOSEU NEGATIVE NEGATIVE  HGBUR NEGATIVE NEGATIVE  BILIRUBINUR LARGE* LARGE*  KETONESUR 15* 15*  PROTEINUR 30* NEGATIVE  UROBILINOGEN 0.2 1.0  NITRITE POSITIVE* POSITIVE*  LEUKOCYTESUR SMALL* MODERATE*    Micro Results: Recent Results (from the past 240 hour(s))  CULTURE, BLOOD (ROUTINE X 2)     Status: None   Collection Time    11/18/13  9:45 AM      Result Value Ref Range  Status   Specimen Description BLOOD LEFT ANTECUBITAL   Final   Special Requests BOTTLES DRAWN AEROBIC AND ANAEROBIC 10CC   Final   Culture  Setup Time     Final   Value: 11/18/2013 14:27     Performed at [facility]   Culture     Final   Value:        BLOOD CULTURE RECEIVED NO GROWTH TO DATE CULTURE WILL BE HELD FOR 5 DAYS BEFORE ISSUING A FINAL NEGATIVE REPORT     Performed at [facility]   Report Status PENDING   Incomplete  CULTURE, BLOOD (ROUTINE X 2)     Status: None   Collection Time    11/18/13 11:32 AM      Result Value Ref Range Status   Specimen Description BLOOD HAND RIGHT   Final   Special Requests BOTTLES DRAWN AEROBIC AND ANAEROBIC 5CC   Final   Culture  Setup Time     Final   Value: 11/18/2013 16:09     Performed at [facility]   Culture     Final   Value:        BLOOD CULTURE RECEIVED NO GROWTH TO DATE CULTURE WILL BE HELD FOR 5 DAYS BEFORE ISSUING A FINAL NEGATIVE REPORT     Performed at [facility]   Report  Status PENDING   Incomplete  URINE CULTURE     Status: None   Collection Time    11/18/13 12:19 PM      Result Value Ref Range Status   Specimen Description URINE, CLEAN CATCH   Final   Special Requests NONE   Final   Culture  Setup Time     Final   Value: 11/18/2013 12:37     Performed at [facility] Count     Final   Value: >=100,000 COLONIES/ML     Performed at [facility]   Culture     Final   Value: Multiple bacterial morphotypes present, none predominant. Suggest appropriate recollection if clinically indicated.     Performed at [facility]   Report Status 11/20/2013 FINAL   Final  MRSA PCR SCREENING     Status: None   Collection Time    11/21/13  3:20 PM      Result Value Ref Range Status   MRSA by PCR NEGATIVE  NEGATIVE Final   Comment:            The GeneXpert MRSA Assay (FDA     approved for NASAL specimens     only), is one component of a     comprehensive MRSA colonization     surveillance program. It is not     intended to diagnose MRSA     infection nor to guide or     monitor treatment for     MRSA infections.   Studies/Results: Dg Chest Portable 1 View  11/21/2013   CLINICAL DATA:  Status post VATS  EXAM: PORTABLE CHEST - 1 VIEW  COMPARISON:  11/18/2013  FINDINGS: Two new chest tubes are noted on the left. No definitive pneumothorax is seen. A left-sided central venous line is seen with the tip in the mid superior vena cava. Patchy changes are noted bilaterally but improved when compared with the prior exam. The cardiac shadow is stable. No bony abnormality is seen.  IMPRESSION: Improved infiltrative changes bilaterally.  Status post VATS without evidence of pneumothorax. Chest tubes are  noted in place   Electronically Signed   By: Alcide Clever M.D.   On: 11/21/2013 14:34   Medications: I have reviewed the patient's current medications. Scheduled Meds: . acetaminophen  1,000 mg Oral 4 times per day   Or  . acetaminophen  (TYLENOL) oral liquid 160 mg/5 mL  1,000 mg Oral 4 times per day  . [START ON 11/22/2013] bisacodyl  10 mg Oral Daily  . cefUROXime (ZINACEF)  IV  1.5 g Intravenous Q12H  . fentaNYL  50 mcg Transdermal Q72H  . fentaNYL   Intravenous 6 times per day  . folic acid  1 mg Oral Daily  . ipratropium-albuterol  3 mL Nebulization Q6H  . levETIRAcetam  500 mg Oral BID  . magnesium sulfate 1 - 4 g bolus IVPB  2 g Intravenous Once  . methylPREDNISolone (SOLU-MEDROL) injection  80 mg Intravenous 3 times per day  . midazolam      . morphine      . pantoprazole  40 mg Oral BID  . phenazopyridine  100 mg Oral TID WC  . pregabalin  300 mg Oral BID  . saccharomyces boulardii  250 mg Oral BID  . vitamin B-12  2,000 mcg Oral Daily   Continuous Infusions: . dextrose 5 % and 0.45 % NaCl with KCl 20 mEq/L 100 mL/hr at 11/21/13 1759   PRN Meds:.diphenhydrAMINE, diphenhydrAMINE, naloxone, ondansetron (ZOFRAN) IV, oxyCODONE, [START ON 11/22/2013] oxyCODONE-acetaminophen, polyethylene glycol, potassium chloride, senna-docusate, sodium chloride, SUMAtriptan, traMADol Assessment/Plan: 53 year old [place] female with past medical history of fibromyalgia, seizure disorder, and migraines with recent multiple admissions for respiratory distress who presents with acute onset chest pain and found to have sepsis with new infiltrates on chest xray.   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 Etiology unkown possibly due to autoimmune disease (sarcoid) vs ILD vs COP. Pt is a former smoker with FH of SLE in grandmother. No history of occupational exposures. S/p VATS with left upper lobe biopsy. -Oxygen therapy to keep SpO2> 92 % (pt at home on 3.5L O2 PRN)  -Blood cultures x 2 --> NGTD  -Obtain procalcitonin  level (0.2) --> stop IV vancomycin and cefepime on 3/5 (Day 3)  -Duoneb breathing treatment TID  -IV solumedrol 80 mg TID  -Monitor leukocytosis --> 31.2K on admission to 12.3K  -Obtain hypersensitivity pnuemonitis, anti-Jo, anti- scleroderma  -Appreciate pulmonology and CVTS recommendations   Hypokalemia - Resolved. K 2.9 on 3/5 whicih improved with repletion. K normal at 3.8 today. Etiology likely due to GI loss (emesis, diarrhea) vs inadequate nutrition. -Monitor BMET  -Replete as necessary   Symptomatic UTI - Has pyuria, bacteriuria, and dysuria. No prior h/o of UTI in previous hospitalizations. Initial urine culture with 100K multiple bacterial morphotypes present.  -f/u repeat urine culture (clean catch)  -Received 2 days of IV cefepime, start IV ceftriaxone 1 g x 1 day (reasses for additional course)  -Pyridium 100 mg TID   Acute on chronic epigastric pain - Had increased epigastric pain during this hospitalization. Etiology unknown, most likely due to gastritis (chronic etodolac use) vs PUD vs GERD. No prior EGD testing.  -f/u H. Pylori stool antigen  -IV Zofran Q 6hr for nausea  -Continue protonix 40 mg BID  -Miralax daily PRN constipation   Pleuritic Chest Pain - Pt with similar complaint at  prior hospitalization. Pt with no history of CAD. Pt with chronic tenderness to palpation on exam suggestive of possible costochondritis. D-dimer was elevated (2.23) however recent CTA chest on 2/15 with no PE. Troponins were negative and no ischemic changes on 12-lead EKG.  -IV morphine Q 4 hr pain  -Continue home tramadol 50 mg BID PRN pain  -Continue home etodolac 500 mg BID  -Continue home fentanyl 50 mcg patch Q 72 hrs  -Monitor on telemetry   History of Grade 1 Diastolic CHF - 2D-echo on 1/28 with EF 65-70% with grade 1 diastolic dysfunction (not present on 2/16 2D-echo) PT with elevated Pro-BNP 585.4 but lower than previous admission (3533). Pt received IV Lasix 40 mg on night of  admission.  -Monitor daily weights and volume status   C difficile Infection - Resolved. Pt with + c diff toxin on last hospitalization and completed treatment after discharge (2/19 for 10 days). Pt received metronidazole (PO then IV) on 3/3-3/5 for total 2 days.  -Continue probiotic 250 mg BID  -D/C contact isolation (per ID if diarrhea resolved no further need for isolation)   Seizure Disorder - Pt with no recent seizure activity.  -Continue keppra 500 mg BID   Fibromyalgia - Pt with diffuse body aches and pain unchanged from baseline.  -Continue home tramadol 50 mg BID PRN pain  -Continue home etodolac 500 mg BID  -Continue home fentanyl 50 mcg patch Q 72 hrs  -Continue home pregabalin 300 mg BID  -Obtain PT consult   Migraine headache - currently without migraine headache  -Continue home sumatriptan Q 2hr PRN   Chronic Normocytic Anemia with folate/B12 deficiency - Hg above baseline of 10. 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 Pt with no recent active bleeding or hemodynamic instability.  -Continue to monitor  -Monitor for bleeding  -Transfuse if Hg<7  -Continue folate 1 mg daily  -Continue B12 supplements 2000 mcg daily   Vitamin D Deficiency - Pt with recent 25-OH Vitamin D of 13.  -Continue vitamin D 50,000 units every 7 days (last dose?)   Severe Malnutrition in setting of hypoalbuminemia - Pt with chronic low albumin levels. Etiology most likley due to chronic protein malnutrition vs falsely low in setting of acute infection (acute phase reactant).  -Ensure Complete BID  -Regular diet   GERD - currently without reflex symptoms, but acute on chronic epigastric pain  -Continue home pantoprazole 40 mg BID   Diet: Regular   DVT Ppx: SCDs  Code: Full  Dispo: Disposition is deferred at this time, awaiting improvement of current medical problems.    The patient does have a current PCP Rocco Serene, MD) and does not need an [facility]  hospital follow-up appointment after discharge.  The patient does have transportation limitations that hinder transportation to clinic appointments.  .Services Needed at time of discharge: Y = Yes, Blank = No PT:   OT:   RN:   Equipment:   Other:     LOS: 3 days   Ky Barban, MD 11/21/2013, 6:37 PM

## 2013-11-21 NOTE — Progress Notes (Signed)
PT Cancellation Note  Patient Details Name: [name] [name] MRN: [id number] DOB: 09/18/1960   Cancelled Treatment:    Reason Eval/Treat Not Completed: Patient at procedure or test/unavailable. Pt being prepped for OR at this time. Will re-attempt to see after procedure and as medically appropriate.    Donnamarie PoagWest, [name] [name], [place] [phone] 11/21/2013, 7:58 AM

## 2013-11-21 NOTE — Anesthesia Preprocedure Evaluation (Addendum)
Anesthesia Evaluation  Patient identified by MRN, date of birth, ID band Patient awake    Airway Mallampati: I TM Distance: >3 FB Neck ROM: Full    Dental  (+) Teeth Intact, Dental Advisory Given   Pulmonary pneumonia -,  breath sounds clear to auscultation        Cardiovascular Rhythm:Regular Rate:Normal     Neuro/Psych  Headaches, Seizures -,   Neuromuscular disease    GI/Hepatic   Endo/Other    Renal/GU      Musculoskeletal  (+) Fibromyalgia -  Abdominal (+) + obese,   Peds  Hematology  (+) anemia ,   Anesthesia Other Findings   Reproductive/Obstetrics                          Anesthesia Physical Anesthesia Plan  ASA: III  Anesthesia Plan:    Post-op Pain Management:    Induction:   Airway Management Planned:   Additional Equipment:   Intra-op Plan:   Post-operative Plan:   Informed Consent: I have reviewed the patients History and Physical, chart, labs and discussed the procedure including the risks, benefits and alternatives for the proposed anesthesia with the patient or authorized representative who has indicated his/her understanding and acceptance.   Dental advisory given  Plan Discussed with: CRNA and Anesthesiologist  Anesthesia Plan Comments:        Anesthesia Quick Evaluation

## 2013-11-22 ENCOUNTER — Inpatient Hospital Stay (HOSPITAL_COMMUNITY): Payer: Medicaid Other

## 2013-11-22 DIAGNOSIS — G8918 Other acute postprocedural pain: Secondary | ICD-10-CM

## 2013-11-22 LAB — BLOOD GAS, ARTERIAL
Acid-Base Excess: 2.7 mmol/L — ABNORMAL HIGH (ref 0.0–2.0)
Bicarbonate: 27 mEq/L — ABNORMAL HIGH (ref 20.0–24.0)
DRAWN BY: 252031
O2 Content: 2 L/min
O2 Saturation: 99.1 %
PCO2 ART: 43.4 mmHg (ref 35.0–45.0)
PH ART: 7.411 (ref 7.350–7.450)
Patient temperature: 98.6
TCO2: 28.3 mmol/L (ref 0–100)
pO2, Arterial: 118 mmHg — ABNORMAL HIGH (ref 80.0–100.0)

## 2013-11-22 LAB — URINE CULTURE: Colony Count: 75000

## 2013-11-22 LAB — BASIC METABOLIC PANEL
BUN: 11 mg/dL (ref 6–23)
CALCIUM: 8.4 mg/dL (ref 8.4–10.5)
CO2: 26 meq/L (ref 19–32)
Chloride: 101 mEq/L (ref 96–112)
Creatinine, Ser: 0.59 mg/dL (ref 0.50–1.10)
GFR calc Af Amer: >90 mL/min (ref >90)
Glucose, Bld: 124 mg/dL — ABNORMAL HIGH (ref 70–99)
POTASSIUM: 4.3 meq/L (ref 3.7–5.3)
Sodium: 136 mEq/L — ABNORMAL LOW (ref 137–147)

## 2013-11-22 LAB — CBC
HEMATOCRIT: 29.9 % — AB (ref 36.0–46.0)
HEMOGLOBIN: 9.5 g/dL — AB (ref 12.0–15.0)
MCH: 27.8 pg (ref 26.0–34.0)
MCHC: 31.8 g/dL (ref 30.0–36.0)
MCV: 87.4 fL (ref 78.0–100.0)
Platelets: 339 10*3/uL (ref 150–400)
RBC: 3.42 MIL/uL — AB (ref 3.87–5.11)
RDW: 18.1 % — ABNORMAL HIGH (ref 11.5–15.5)
WBC: 9.4 10*3/uL (ref 4.0–10.5)

## 2013-11-22 LAB — MAGNESIUM: MAGNESIUM: 2.2 mg/dL (ref 1.5–2.5)

## 2013-11-22 MED ORDER — IPRATROPIUM-ALBUTEROL 0.5-2.5 (3) MG/3ML IN SOLN
3.0000 mL | Freq: Three times a day (TID) | RESPIRATORY_TRACT | Status: DC
  Administered 2013-11-22 – 2013-11-27 (×17): 3 mL via RESPIRATORY_TRACT
  Filled 2013-11-22 (×17): qty 3

## 2013-11-22 NOTE — Progress Notes (Signed)
Name: [name] [name] MRN: [id number] DOB: [birth year]    ADMISSION DATE:  11/18/2013 CONSULTATION DATE:  3/3  REFERRING MD : Murlean Caller PRIMARY SERVICE:  IMTS  CHIEF COMPLAINT:  Dyspnea and CP   BRIEF PATIENT DESCRIPTION:  53 year old female whom PCCM has seen on several occassions for recurrent steroid responsive pneumonitis of uncertain etiology. Just discharged on 2/19 w/ recommendations for steroid taper w/ suggestion that she would need Open lung bx should this re-occur. At time of d/c her CXR was essentially clear. She was re-admitted on 3/3 w/ CC: 1 d/ h/o chest pain and progressive dyspnea. CXR w/ reoccurrence of bilateral pulmonary infiltrates. PCCM was asked to eval.    SIGNIFICANT EVENTS:  STUDIES:  1/28 - CTA chest>>>No PE. Bilateral prominently upper lobe mixed airspace and ground-glass opacities, multi lobar pneumonia is favored. Mediastinal and bilateral hilar adenopathy  1/28 - Bronchoscopy >>> 6688 WBC (93% neutrophils), Cx negative 1/28 - TTE >>> EF 65-70%, grade 1 DD, PA peak 38, no change from prior   1/28 - Labs >>> P ANCA 1:20, ANA 1:80, DS DNA 1, RF 13, ESR 98 2/07 - Labs >>> CPK 31, SS-A < 1, SS-B < 1, ACE 14 2/09 - Labs >>> P ANCA 1:20, MPO 1.1 2/15 - Chest CT >>> No pulmonary embolism, persistent upper lobe predominant bilateral airspace and ground-glass opacities, improved relative to most recent CT from 1/28, mediastinal and bilateral adenopathy 2/16 Echo  >>> EF 83%, grade 1 systolic dysfunction  .................................................................................................................................................... 3/03 - Admit with CP & Progressive dyspnea.  CT chest >>> diffuse b/l GGO with upper lobe predominance  LINES / TUBES: OETT 3/6>>>3/6 [place] 3/6>>> L [place] CVL 3/6>>> L CT x 2 3/6>>>  CULTURES: Blood 3/03 >>>  Urine 3/5>>>  Autoimmune:  AntiSCL70 - neg  Jo-1 - neg  Anti-ds DNA - neg ANA - POS c-ANCA - neg P-ANCA - POS RF  - neg  PCP - neg  C3 complement - wnl C4 complement - wnl   ANTIBIOTICS: Cefepime 3/3 x1 vanc 3/3 >>>3/5 Flagyl 3/3 >>>3/05 Cefuroxime 3/5>>>for OR  SUBJECTIVE:   Extubated 3/6 post op.  Cont to c/o significant pain.  Very anxious.   VITAL SIGNS: Temp:  [96.4 F (35.8 C)-98.5 F (36.9 C)] 97.7 F (36.5 C) (03/07 0800) Pulse Rate:  [47-73] 65 (03/07 0800) Resp:  [12-40] 24 (03/07 0800) BP: (77-156)/(59-91) 115/68 mmHg (03/07 0800) SpO2:  [99 %-100 %] 100 % (03/07 0800) Arterial Line BP: (82-181)/(56-177) 101/83 mmHg (03/07 0800) Weight:  [121 lb 8 oz (55.112 kg)-122 lb 12.7 oz (55.7 kg)] 122 lb 12.7 oz (55.7 kg) (03/07 0600)  PHYSICAL EXAMINATION: General:  Chronically ill appearing Neuro: normal strength, generalized weakness HEENT: no sinus tenderness Cardiovascular: regular, tachycardic Lungs:  Dry crackles bases Abdomen:  Soft, mild tenderness Musculoskeletal: 1+ edema Skin: no rashes   PULMONARY  Recent Labs Lab 11/20/13 2123 11/21/13 1423 11/22/13 0320  PHART 7.477* 7.413 7.411  PCO2ART 38.1 42.9 43.4  PO2ART 59.7* 178.0* 118.0*  HCO3 27.8* 27.3* 27.0*  TCO2 29.0 28.7 28.3  O2SAT 90.9 100.0 99.1    CBC  Recent Labs Lab 11/20/13 0215 11/20/13 2204 11/22/13 0355  HGB 11.0* 10.2* 9.5*  HCT 34.2* 31.5* 29.9*  WBC 12.3* 12.4* 9.4  PLT 418* 424* 339    COAGULATION  Recent Labs Lab 11/18/13 1657 11/20/13 2204  INR 1.43 1.26    CARDIAC    Recent Labs Lab 11/18/13 1659 11/18/13 2300  TROPONINI <0.30 <0.30  Recent Labs Lab 11/18/13 0945  PROBNP 585.4*     CHEMISTRY  Recent Labs Lab 11/20/13 0215 11/20/13 1145 11/20/13 2204 11/21/13 0245 11/22/13 0355  NA 143 140 138 137 136*  K 2.9* 3.6* 3.8 3.8 4.3  CL 100 101 100 100 101  CO2 _0 GLUCOSE 183* 155* 103* 80 124*  BUN _1 CREATININE 0.62 0.68 0.61 0.63 0.59  CALCIUM 9.2 8.8 9.3 8.8 8.4  MG 1.8  --   --  1.6 2.2   Estimated Creatinine  Clearance: 62.9 ml/min (by C-G formula based on Cr of 0.59).   LIVER  Recent Labs Lab 11/18/13 0945 11/18/13 1657 11/20/13 2204  AST 16  --  8  ALT 7  --  5  ALKPHOS 119*  --  81  BILITOT 0.5  --  0.2*  PROT 7.9  --  6.9  ALBUMIN 2.5*  --  2.5*  INR  --  1.43 1.26     INFECTIOUS  Recent Labs Lab 11/18/13 0958 11/20/13 1145  LATICACIDVEN 1.93  --   PROCALCITON  --  0.20     ENDOCRINE CBG (last 3)  No results found for this basename: GLUCAP,  in the last 72 hours   IMAGING x48h  Dg Chest Port 1 View  11/22/2013   CLINICAL DATA:  Left vats  EXAM: PORTABLE CHEST - 1 VIEW  COMPARISON:  11/21/2013  FINDINGS: There is a left subclavian catheter with tip in the projection of the SVC. There are 2 left-sided chest tubes. No significant pneumothorax identified. Stable postoperative changes within the left upper lung.  IMPRESSION: 1. Stable exam. 2. Left chest tubes in place without significant pneumothorax.   Electronically Signed   By: Kerby Moors M.D.   On: 11/22/2013 08:52   Dg Chest Portable 1 View  11/21/2013   CLINICAL DATA:  Status post VATS  EXAM: PORTABLE CHEST - 1 VIEW  COMPARISON:  11/18/2013  FINDINGS: Two new chest tubes are noted on the left. No definitive pneumothorax is seen. A left-sided central venous line is seen with the tip in the mid superior vena cava. Patchy changes are noted bilaterally but improved when compared with the prior exam. The cardiac shadow is stable. No bony abnormality is seen.  IMPRESSION: Improved infiltrative changes bilaterally.  Status post VATS without evidence of pneumothorax. Chest tubes are noted in place   Electronically Signed   By: Inez Catalina M.D.   On: 11/21/2013 14:34      ASSESSMENT / PLAN:  Recurrent pneumonitis of unclear etiology -  partially responsive to steroids.  ?ILD. S/p VATS with LUL bx.  Acute Respiratory Failure - post VATS 3/6 Pleuritic Chest Pain   P: -cont solumedrol - consider taper  -follow VATS  cultures / biopsy results -wean oxygen for sats >93% -continue bronchodilators -fentanyl PCA for pain  -push pulmonary hygiene - Bipsy result pending-- will take 1 week at least due to send out to Dr Dian Situ   Hypokalemia Hypomagnesemia P: -monitor BMP / Mg -replace lytes as indicated   UTI - post course rocephin IV Hx of Recent C-Diff Infection - Resolved.  P: -abx as above -continue florastor -continue pyridium  -d/c foley    GERD Protein Calorie Malnutrition  Chronic Epigastric Pain - thought related to gastritis Nausea / Vomiting Constipation  P: -PPI -bowel regimen-->miralax, senokot -advance diet as tol - consider ST eval  -zofran for nausea   Chronic Pain  Fibromyalgia Hx seizures Migraine Headaches  P: -continue lyrica, keppra, imitrex -fentanyl PCA -PRN Oxy IR,  Percocet and ultram -pt/ot when ok with CVTS  Anemia  P: -SCD's for DVT proph  -continue B12   GLOBAL: -PCCM SVC while in ICU, will go back to IM TS post transfer from ICU. PCCM will continue to follow -will need PT consult once cleared from [place], NP 11/22/2013  9:36 AM Pager: [phone] or ([phone]  *Care during the described time interval was provided by me and/or other providers on the critical care team. I have reviewed this patient's available data, including medical history, events of note, physical examination and test results as part of my evaluation.    PCCM ATTENDING: I have interviewed and examined the patient and reviewed the database. I have formulated the assessment and plan as reflected in the note above with amendments made by me.   Merton Border, MD;  PCCM service; Mobile [phone]

## 2013-11-22 NOTE — Progress Notes (Signed)
Fentanyl PCA syringe changed, 3mL waste in sink. Sheran LawlessJennifer Davies RN witness waste. Koren BoundWaggoner, Wyndell Cardiff

## 2013-11-22 NOTE — Progress Notes (Signed)
Anesthesiology Follow-up:  Awake and alert complaining of incisional pain but looks more comfortable today. Using PCA fetanyl.  VS: T- 36.9 BP 130/82 RR 17 HR 93 (SR) O2 Sat 100% on 1 L [place]  K-4.3 glucose 124 Na 136 BUN/Cr 11/0.59 H/H 9.5/29.9 Plts 339,000  POD #1 S/P L.VATS for lung biopsy  For bilateral pneumonitis, stable post-op course to 3300 today.  Kipp Broodavid Husein Guedes, MD

## 2013-11-22 NOTE — Progress Notes (Signed)
Dr Sung AmabileSimonds updated pt with history of c.diff with feb. Admission, MD reviewed medical record, MD ok to take pt off contact precautions/enteric, pt not actively stooling, if diarrhea starts back, will place back on precautions and send stool sample. Koren BoundWaggoner, Fotini Lemus

## 2013-11-22 NOTE — Progress Notes (Signed)
Pt OOB to chair with 2 assist. Pt encouraged to ambulate in hall, educated on importance of ambulation post op. Pt refused to ambulate in hall at this time. Pt ambulated ~3360ft this AM. Will continue to monitor. Koren BoundWaggoner, Railey Glad

## 2013-11-22 NOTE — Addendum Note (Signed)
Addendum created 11/22/13 1752 by Kipp Broodavid Antuane Eastridge, MD   Modules edited: Clinical Notes   Clinical Notes:  File: [id number]; Pend: [phone]

## 2013-11-22 NOTE — Progress Notes (Addendum)
      [address]       [place] [id number]             [phone]        CARDIOTHORACIC SURGERY PROGRESS NOTE   R1 Day Post-Op Procedure(s) (LRB): VIDEO BRONCHOSCOPY (N/A) VIDEO ASSISTED THORACOSCOPY (Left) LUNG BIOPSY (Left)  Subjective: Difficult management of pain and anxiety but overall clinically doing well  Objective: Vital signs: BP Readings from Last 1 Encounters:  11/22/13 115/68   Pulse Readings from Last 1 Encounters:  11/22/13 65   Resp Readings from Last 1 Encounters:  11/22/13 24   Temp Readings from Last 1 Encounters:  11/22/13 97.7 F (36.5 C) Oral    Hemodynamics:    Physical Exam:  Rhythm:   sinus  Breath sounds: clear  Heart sounds:  RRR  Incisions:  Dressing dry, intact  Abdomen:  Soft, non-distended, non-tender  Extremities:  Warm, well-perfused  Chest tubes:  No air leak   Intake/Output from previous day: 03/06 0701 - 03/07 0700 In: 3231.7 [P.O.:780; I.V.:2301.7; IV Piggyback:150] Out: 1512 [Urine:1420; Chest Tube:92] Intake/Output this shift: Total I/O In: 100 [I.V.:100] Out: 70 [Urine:60; Chest Tube:10]  Lab Results:  CBC: Recent Labs  11/20/13 2204 11/22/13 0355  WBC 12.4* 9.4  HGB 10.2* 9.5*  HCT 31.5* 29.9*  PLT 424* 339    BMET:  Recent Labs  11/21/13 0245 11/22/13 0355  NA 137 136*  K 3.8 4.3  CL 100 101  CO2 26 26  GLUCOSE 80 124*  BUN 15 11  CREATININE 0.63 0.59  CALCIUM 8.8 8.4     CBG (last 3)  No results found for this basename: GLUCAP,  in the last 72 hours  ABG    Component Value Date/Time   PHART 7.411 11/22/2013 0320   PCO2ART 43.4 11/22/2013 0320   PO2ART 118.0* 11/22/2013 0320   HCO3 27.0* 11/22/2013 0320   TCO2 28.3 11/22/2013 0320   ACIDBASEDEF 1.0 10/17/2013 0952   O2SAT 99.1 11/22/2013 0320    CXR: CLINICAL DATA: Left vats  EXAM:  PORTABLE CHEST - 1 VIEW  COMPARISON: 11/21/2013  FINDINGS:  There is a left subclavian catheter with tip in the projection of  the SVC.  There are 2 left-sided chest tubes. No significant  pneumothorax identified. Stable postoperative changes within the  left upper lung.  IMPRESSION:  1. Stable exam.  2. Left chest tubes in place without significant pneumothorax.  Electronically Signed  By: Signa Kellaylor Stroud M.D.  On: 11/22/2013 08:52   Assessment/Plan: S/P Procedure(s) (LRB): VIDEO BRONCHOSCOPY (N/A) VIDEO ASSISTED THORACOSCOPY (Left) LUNG BIOPSY (Left)  Overall stable POD1 D/C a-line Decrease IV fluids D/C foley Chest tubes to water seal Mobilize Remainder of plan per Pulm/CCM team Okay to transfer to 3S step down  [name] [name] H 11/22/2013 11:23 AM

## 2013-11-23 ENCOUNTER — Inpatient Hospital Stay (HOSPITAL_COMMUNITY): Payer: Medicaid Other

## 2013-11-23 LAB — COMPREHENSIVE METABOLIC PANEL
ALBUMIN: 1.8 g/dL — AB (ref 3.5–5.2)
ALT: 5 U/L (ref 0–35)
AST: 9 U/L (ref 0–37)
Alkaline Phosphatase: 51 U/L (ref 39–117)
BUN: 8 mg/dL (ref 6–23)
CHLORIDE: 109 meq/L (ref 96–112)
CO2: 24 meq/L (ref 19–32)
Calcium: 7.2 mg/dL — ABNORMAL LOW (ref 8.4–10.5)
Creatinine, Ser: 0.51 mg/dL (ref 0.50–1.10)
GFR calc Af Amer: >90 mL/min (ref >90)
Glucose, Bld: 102 mg/dL — ABNORMAL HIGH (ref 70–99)
POTASSIUM: 3.4 meq/L — AB (ref 3.7–5.3)
Sodium: 144 mEq/L (ref 137–147)
Total Protein: 4.8 g/dL — ABNORMAL LOW (ref 6.0–8.3)

## 2013-11-23 LAB — CBC
HCT: 29.7 % — ABNORMAL LOW (ref 36.0–46.0)
Hemoglobin: 9.2 g/dL — ABNORMAL LOW (ref 12.0–15.0)
MCH: 27.1 pg (ref 26.0–34.0)
MCHC: 31 g/dL (ref 30.0–36.0)
MCV: 87.4 fL (ref 78.0–100.0)
PLATELETS: 332 10*3/uL (ref 150–400)
RBC: 3.4 MIL/uL — AB (ref 3.87–5.11)
RDW: 17.8 % — AB (ref 11.5–15.5)
WBC: 12.1 10*3/uL — ABNORMAL HIGH (ref 4.0–10.5)

## 2013-11-23 MED ORDER — METHYLPREDNISOLONE SODIUM SUCC 125 MG IJ SOLR
80.0000 mg | Freq: Two times a day (BID) | INTRAMUSCULAR | Status: DC
  Administered 2013-11-23 – 2013-11-24 (×2): 80 mg via INTRAVENOUS
  Filled 2013-11-23 (×4): qty 1.28

## 2013-11-23 NOTE — Progress Notes (Addendum)
[address]       [place] [id number]             [phone]          2 Days Post-Op Procedure(s) (LRB): VIDEO BRONCHOSCOPY (N/A) VIDEO ASSISTED THORACOSCOPY (Left) LUNG BIOPSY (Left)  Subjective: Having a lot of pain at chest tube sites, breathing stable.   Objective: Vital signs in last 24 hours: Patient Vitals for the past 24 hrs:  BP Temp Temp src Pulse Resp SpO2  11/23/13 0830 - - - - - 100 %  11/23/13 0752 145/83 mmHg - - - - 2 %  11/23/13 0700 - 98.3 F (36.8 C) Oral - - -  11/23/13 0350 123/86 mmHg 97.6 F (36.4 C) Oral 77 13 100 %  11/23/13 0029 149/73 mmHg 98.2 F (36.8 C) Oral 89 18 100 %  11/22/13 2204 - - - - - 100 %  11/22/13 2051 155/90 mmHg 98.2 F (36.8 C) Oral 90 21 100 %  11/22/13 1841 149/80 mmHg 98.8 F (37.1 C) Oral 89 21 100 %  11/22/13 1714 - - - - 14 100 %  11/22/13 1711 130/82 mmHg - - 85 17 100 %  11/22/13 1700 74/57 mmHg - - 85 20 100 %  11/22/13 1600 122/71 mmHg - - 75 12 100 %  11/22/13 1500 119/68 mmHg 98.4 F (36.9 C) Oral 81 15 100 %  11/22/13 1439 - - - - - 100 %  11/22/13 1400 136/82 mmHg - - 71 17 100 %  11/22/13 1300 141/82 mmHg - - 68 16 100 %  11/22/13 1200 144/82 mmHg - - 69 17 100 %  11/22/13 1147 - 98 F (36.7 C) Oral - - -  11/22/13 1100 - - - 103 23 98 %  11/22/13 1050 - - - 65 17 100 %  11/22/13 1000 141/79 mmHg - - 81 23 100 %  11/22/13 0900 114/94 mmHg - - 95 17 100 %   Current Weight  11/22/13 122 lb 12.7 oz (55.7 kg)     Intake/Output from previous day: 03/07 0701 - 03/08 0700 In: 660 [P.O.:120; I.V.:540] Out: 1415 [Urine:1385; Chest Tube:30]    PHYSICAL EXAM:  Heart: RRR, mildly tachy Lungs: Diminished BS in bases Wound: Clean and dry Chest tube: no air leak    Lab Results: CBC: Recent Labs  11/22/13 0355 11/23/13 0432  WBC 9.4 12.1*  HGB 9.5* 9.2*  HCT 29.9* 29.7*  PLT 339 332   BMET:  Recent Labs  11/22/13 0355 11/23/13 0432  NA 136* 144  K 4.3 3.4*    CL 101 109  CO2 26 24  GLUCOSE 124* 102*  BUN 11 8  CREATININE 0.59 0.51  CALCIUM 8.4 7.2*    PT/INR:  Recent Labs  11/20/13 2204  LABPROT 15.5*  INR 1.26      Assessment/Plan: S/P Procedure(s) (LRB): VIDEO BRONCHOSCOPY (N/A) VIDEO ASSISTED THORACOSCOPY (Left) LUNG BIOPSY (Left) CT output low, no air leak.  Small apical ptx on CXR. Continue CTs to water seal for now. Pain seems to be primary issue- continue PCA, po meds. Medical issues per CCM. Path pending, cx's negative so far.   LOS: 5 days    COLLINS,GINA H 11/23/2013  I have seen and examined the patient and agree with the assessment and plan as outlined.  D/C anterior chest tube.  Leave remaining tube to water seal.  OWEN,CLARENCE H 11/23/2013 11:50 AM

## 2013-11-23 NOTE — Progress Notes (Signed)
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 Just discharged on 2/19 w/ recommendations for steroid taper w/ suggestion that she would need Open lung bx should this re-occur. At time of d/c her CXR was essentially clear. She was re-admitted on 3/3 w/ CC: 1 d/ h/o chest pain and progressive dyspnea. CXR w/ reoccurrence of bilateral pulmonary infiltrates. PCCM was asked to eval.    SIGNIFICANT EVENTS/STUDIES:  1/28 - CTA chest>>>No PE. Bilateral prominently upper lobe mixed airspace and ground-glass opacities, multi lobar pneumonia is favored. Mediastinal and bilateral hilar adenopathy  1/28 - Bronchoscopy >>> 6688 WBC (93% neutrophils), Cx negative 1/28 - TTE >>> EF 65-70%, grade 1 DD, PA peak 38, no change from prior   1/28 - Labs >>> P ANCA 1:20, ANA 1:80, DS DNA 1, RF 13, ESR 98 2/07 - Labs >>> CPK 31, SS-A < 1, SS-B < 1, ACE 14 2/09 - Labs >>> P ANCA 1:20, MPO 1.1 2/15 - Chest CT >>> No pulmonary embolism, persistent upper lobe predominant bilateral airspace and ground-glass opacities, improved relative to most recent CT from 1/28, mediastinal and bilateral adenopathy 2/16 Echo  >>> EF 40%, grade 1 systolic dysfunction  .................................................................................................................................................... 3/03 - Admit with CP & Progressive dyspnea.  CT chest >>> diffuse b/l GGO with upper lobe predominance 3/6  VATS bx >>>  LINES / TUBES: OETT 3/6>>>3/6 [place] 3/6>>> L [place] CVL 3/6>>> L CT x 2 3/6>>>  CULTURES: Blood 3/03 >>>  Urine 3/5> yeast x 75 k Bronchial Washings 3/6>>> Tissue culture 3/6 >>>  Autoimmune:  AntiSCL70 - neg   Jo-1 - neg  Anti-ds DNA - neg ANA - POS/ homogeneous and Anti DNA Pos only at 1 dilution c-ANCA - neg P-ANCA - POS RF - neg  PCP - neg  C3 complement - wnl C4 complement - wnl   ANTIBIOTICS: Cefepime 3/3 x1 vanc 3/3 >>>3/5 Flagyl 3/3 >>>3/05 Cefuroxime 3/5>>>for OR  SUBJECTIVE:    Pain requiring freq fentanyl, no sob @ 30 degrees hob  VITAL SIGNS: Temp:  [97.6 F (36.4 C)-98.8 F (37.1 C)] 98.2 F (36.8 C) (03/08 1134) Pulse Rate:  [68-90] 77 (03/08 0350) Resp:  [12-21] 18 (03/08 0752) BP: (74-155)/(57-90) 145/83 mmHg (03/08 0752) SpO2:  [100 %] 100 % (03/08 0830) Arterial Line BP: (115-123)/(77-79) 123/77 mmHg (03/07 1400) FIO2  2lpm NP  PHYSICAL EXAMINATION: General:  Chronically ill appearing Neuro: normal strength, generalized weakness HEENT: no sinus tenderness Cardiovascular: regular, tachycardic Lungs:  Dry crackles bases Abdomen:  Soft, mild tenderness Musculoskeletal: 1+ edema Skin: no rashes   PULMONARY  Recent Labs Lab 11/20/13 2123 11/21/13 1423 11/22/13 0320  PHART 7.477* 7.413 7.411  PCO2ART 38.1 42.9 43.4  PO2ART 59.7* 178.0* 118.0*  HCO3 27.8* 27.3* 27.0*  TCO2 29.0 28.7 28.3  O2SAT 90.9 100.0 99.1    CBC  Recent Labs Lab 11/20/13 2204 11/22/13 0355 11/23/13 0432  HGB 10.2* 9.5* 9.2*  HCT 31.5* 29.9* 29.7*  WBC 12.4* 9.4 12.1*  PLT 424* 339 332    COAGULATION  Recent Labs Lab 11/18/13 1657 11/20/13 2204  INR 1.43 1.26    CARDIAC    Recent Labs Lab 11/18/13 1659 11/18/13 2300  TROPONINI <0.30 <0.30  Recent Labs Lab 11/18/13 0945  PROBNP 585.4*     CHEMISTRY  Recent Labs Lab 11/20/13 0215 11/20/13 1145 11/20/13 2204 11/21/13 0245 11/22/13 0355 11/23/13 0432  NA 143 140 138 137 136* 144  K 2.9* 3.6* 3.8 3.8 4.3 3.4*  CL 100 101 100 100 101 109  CO2 _0 GLUCOSE 183* 155* 103* 80 124* 102*  BUN _1 CREATININE 0.62 0.68 0.61 0.63 0.59 0.51  CALCIUM 9.2 8.8 9.3  8.8 8.4 7.2*  MG 1.8  --   --  1.6 2.2  --    Estimated Creatinine Clearance: 62.9 ml/min (by C-G formula based on Cr of 0.51).   LIVER  Recent Labs Lab 11/18/13 0945 11/18/13 1657 11/20/13 2204 11/23/13 0432  AST 16  --  8 9  ALT 7  --  5 5  ALKPHOS 119*  --  81 51  BILITOT 0.5  --  0.2* <0.2*  PROT 7.9  --  6.9 4.8*  ALBUMIN 2.5*  --  2.5* 1.8*  INR  --  1.43 1.26  --      INFECTIOUS  Recent Labs Lab 11/18/13 0958 11/20/13 1145  LATICACIDVEN 1.93  --   PROCALCITON  --  0.20     ENDOCRINE CBG (last 3)  No results found for this basename: GLUCAP,  in the last 72 hours   IMAGING x48h  Dg Chest Portable 1 View  11/23/2013   CLINICAL DATA:  Status post VATS procedure.  EXAM: PORTABLE CHEST - 1 VIEW  COMPARISON:  Chest x-ray 11/22/2013.  FINDINGS: There is a Left-sided subclavian central venous catheter with tip terminating in the mid superior vena cava. 2 left-sided chest tubes remain in position with tips projecting over the medial aspect of the apex of the left hemithorax. Small left apical pneumothorax (less than 5% of the volume of the left hemithorax). Postoperative changes of wedge resection in the left upper lobe again noted. Mild interstitial prominence in the lungs bilaterally, predominantly in the left upper lobe and periphery of the left lung base, in improved overall compared to prior study 11/18/2013. No pleural effusions. No evidence of pulmonary edema. Heart size is normal. Upper mediastinal contours are within normal limits.  IMPRESSION: 1. Support apparatus, as above. 2. Small left apical pneumothorax (less than 5% of the volume of the left hemithorax), with 2 left-sided chest tubes which remain appropriately positioned. 3. Decreasing interstitial prominence in lungs bilaterally, as above.   Electronically Signed   By: Vinnie Langton M.D.   On: 11/23/2013 09:29   Dg Chest Port 1 View  11/22/2013   CLINICAL DATA:  Left vats  EXAM: PORTABLE CHEST - 1 VIEW   COMPARISON:  11/21/2013  FINDINGS: There is a left subclavian catheter with tip in the projection of the SVC. There are 2 left-sided chest tubes. No significant pneumothorax identified. Stable postoperative changes within the left upper lung.  IMPRESSION: 1. Stable exam. 2. Left chest tubes in place without significant pneumothorax.   Electronically Signed   By: Kerby Moors M.D.   On: 11/22/2013 08:52   Dg Chest Portable 1 View  11/21/2013   CLINICAL DATA:  Status post VATS  EXAM: PORTABLE CHEST - 1 VIEW  COMPARISON:  11/18/2013  FINDINGS: Two new chest tubes are noted on the left. No definitive pneumothorax is seen. A left-sided central venous line is seen with the tip in the mid superior vena cava. Patchy changes  are noted bilaterally but improved when compared with the prior exam. The cardiac shadow is stable. No bony abnormality is seen.  IMPRESSION: Improved infiltrative changes bilaterally.  Status post VATS without evidence of pneumothorax. Chest tubes are noted in place   Electronically Signed   By: Inez Catalina M.D.   On: 11/21/2013 14:34      ASSESSMENT / PLAN:  Recurrent pneumonitis of unclear etiology -  partially responsive to steroids.  ?ILD? SLE  S/p VATS with LUL bx. 3/6 Acute Respiratory Failure/ 02 dep post op Pleuritic Chest Pain   P: -cont solumedrol - consider taper  -follow VATS cultures / biopsy results -wean oxygen for sats >93% -continue bronchodilators -fentanyl PCA for pain  -push pulmonary hygiene - Biopsy result pending-- will take 1 week at least due to send out to Dr Dian Situ   Hypokalemia Hypomagnesemia P: -monitor BMP / Mg -replace lytes as indicated   UTI - post course rocephin IV Hx of Recent C-Diff Infection - Resolved.  P: -abx as above -continue florastor -continue pyridium  -d/c foley    GERD Protein Calorie Malnutrition  Chronic Epigastric Pain - thought related to gastritis Nausea / Vomiting Constipation  P: -PPI -bowel  regimen-->miralax, senokot -advance diet as tol - consider ST eval  -zofran for nausea   Chronic Pain  Fibromyalgia Hx seizures Migraine Headaches  P: -continue lyrica, keppra, imitrex -fentanyl PCA -PRN Oxy IR,  Percocet and ultram -pt/ot when ok with CVTS  Anemia  P: -SCD's for DVT proph  -continue B12   GLOBAL: -PCCM SVC while in ICU, will go back to IM TS post transfer from ICU. PCCM will continue to follow -will need PT consult once cleared from [place], MD Pulmonary and [place] [phone] After 5:30 PM or weekends, call [phone]

## 2013-11-24 ENCOUNTER — Inpatient Hospital Stay (HOSPITAL_COMMUNITY): Payer: Medicaid Other

## 2013-11-24 LAB — CULTURE, BLOOD (ROUTINE X 2)
CULTURE: NO GROWTH
Culture: NO GROWTH

## 2013-11-24 LAB — CULTURE, RESPIRATORY W GRAM STAIN: Culture: NO GROWTH

## 2013-11-24 LAB — BLOOD GAS, ARTERIAL
ACID-BASE EXCESS: 2.8 mmol/L — AB (ref 0.0–2.0)
Bicarbonate: 27.3 mEq/L — ABNORMAL HIGH (ref 20.0–24.0)
EXPIRATORY PAP: 6
FIO2: 0.5 %
INSPIRATORY PAP: 12
O2 SAT: 100 %
PATIENT TEMPERATURE: 96.4
RATE: 8 resp/min
TCO2: 28.7 mmol/L (ref 0–100)
pCO2 arterial: 42.9 mmHg (ref 35.0–45.0)
pH, Arterial: 7.413 (ref 7.350–7.450)
pO2, Arterial: 178 mmHg — ABNORMAL HIGH (ref 80.0–100.0)

## 2013-11-24 LAB — CULTURE, RESPIRATORY

## 2013-11-24 MED ORDER — METHYLPREDNISOLONE SODIUM SUCC 125 MG IJ SOLR
60.0000 mg | Freq: Two times a day (BID) | INTRAMUSCULAR | Status: DC
  Administered 2013-11-24 – 2013-11-25 (×2): 60 mg via INTRAVENOUS
  Filled 2013-11-24 (×4): qty 0.96

## 2013-11-24 NOTE — Progress Notes (Addendum)
Transfer Summary:  [name] [name] is a 53 year old [place] female with past medical history of fibromyalgia, seizure disorder, and migraines with recent admissions for CAP (1/18-1/23), HCAP complicated by septic shock and AMS requiring ICU-level care (1/27-2/10/15), and (2/15-19) for acute respiratory failure with treatment with corticosteroid therapy who presented to the hospital on 11/18/13 with recurrent acute onset chest pain and shortness of breath.  Her symptoms were felt secondary to steroid-responsive pneumonitis of uncertain etiology. CT chest w/o contrast on 3/4 showed persistent diffuse bilateral ground glass opacities with upper lobe predominance and some sparing of the immediate subpleural lung and recently progressed septal and peribronchovascular thickening. In terms of autoimmune workup, she was only weakly ANA and pANCA positive. At time of last discharge on 2/19 recommendations were for open lung biopsy should symptoms recur. Therefore, she is status post VATS with left upper lung biopsy on 3/6.   She has been managed by PCCM/CTS in the immediate postoperative period. She was extubated on 3/6. Her recovery period was complicated by pain at her chest tube sites and anxiety. She received a fentanyl PCA for pain. Biopsy results are pending and will apparently take 1 week at least due to send out to Dr. Adrienne Mocha. They have maintained her on her steroid taper; Solu-Medrol was decreased to 60 every 12 hours today. Her anterior chest tube was removed today. Chest x-ray today showed stable post-operative left apical pneumothorax. We will continue to follow this up.  She has a left lung tissue culture taken during the procedure, results are as follows so far: AFB shows no acid-fast bacilli to date, Fungus culture no yeast or fungal elements seen, Tissue culture gram stain with abundant WBC present, both PMN and monos, rare squamous epithelial cells, no organisms, no growth to date.  She  also had bronchial washings of her left upper lung, culture results are as follows so far: AFB shows no acid-fast bacilli to date, Fungus culture no yeast or fungal elements seen, Respiratory culture gram stain with moderate WBC present, mostly PMNs, no squamous epithelial cells, no organisms, no growth to date.  PCCM and CTS will continue to follow her with IMTS resuming care as the primary team.  Patient seen and examined at the bedside this afternoon. She says she is in significant pain at the incision sites and presses her PCA in front of [place]. She has no difficulty breathing and has been using her incentive spirometer. She has not been walking around much. Her anterior chest tube was just removed. She has some lower extremity edema and tells [place] it's uncomfortable.  Objective: Vital signs in last 24 hours: Filed Vitals:   11/24/13 0744 11/24/13 0926 11/24/13 0932 11/24/13 1148  BP: 141/80     Pulse: 85     Temp: 98.4 F (36.9 C)     TempSrc: Oral     Resp: 22   24  Height:      Weight:      SpO2: 100% 99% 99% 97%   Weight change:   Intake/Output Summary (Last 24 hours) at 11/24/13 1316 Last data filed at 11/24/13 1200  Gross per 24 hour  Intake 466.33 ml  Output    925 ml  Net -458.67 ml    Physical Exam: Vitals reviewed.  General: resting in bed in NAD, talkative and pleasant Cardiac: tachycardia, no rubs, murmurs or gallops  Pulm: clear to ausculation, satting 100% on room air, posterior chest tube in place with no air leak Abd: soft, nontender,  nondistended, BS present  Ext: warm and well perfused, 1+ edema to thighs Neuro: alert and oriented X3, moves 4 extremities voluntarily, generalized weakness  Lab Results: Basic Metabolic Panel:  Recent Labs Lab 11/21/13 0245 11/22/13 0355 11/23/13 0432  NA 137 136* 144  K 3.8 4.3 3.4*  CL 100 101 109  CO2 26 26 24   GLUCOSE 80 124* 102*  BUN 15 11 8   CREATININE 0.63 0.59 0.51  CALCIUM 8.8 8.4 7.2*  MG 1.6 2.2  --     Liver Function Tests:  Recent Labs Lab 11/20/13 2204 11/23/13 0432  AST 8 9  ALT 5 5  ALKPHOS 81 51  BILITOT 0.2* <0.2*  PROT 6.9 4.8*  ALBUMIN 2.5* 1.8*   No results found for this basename: LIPASE, AMYLASE,  in the last 168 hours No results found for this basename: AMMONIA,  in the last 168 hours CBC:  Recent Labs Lab 11/18/13 0945  11/22/13 0355 11/23/13 0432  WBC 31.2*  < > 9.4 12.1*  NEUTROABS 28.4*  --   --   --   HGB 11.2*  < > 9.5* 9.2*  HCT 35.6*  < > 29.9* 29.7*  MCV 89.0  < > 87.4 87.4  PLT 366  < > 339 332  < > = values in this interval not displayed. Cardiac Enzymes:  Recent Labs Lab 11/18/13 1659 11/18/13 2300  TROPONINI <0.30 <0.30   BNP:  Recent Labs Lab 11/18/13 0945  PROBNP 585.4*   D-Dimer:  Recent Labs Lab 11/18/13 0945  DDIMER 2.23*   CBG: No results found for this basename: GLUCAP,  in the last 168 hours Hemoglobin A1C: No results found for this basename: HGBA1C,  in the last 168 hours Fasting Lipid Panel: No results found for this basename: CHOL, HDL, LDLCALC, TRIG, CHOLHDL, LDLDIRECT,  in the last 168 hours Thyroid Function Tests: No results found for this basename: TSH, T4TOTAL, FREET4, T3FREE, THYROIDAB,  in the last 168 hours Coagulation:  Recent Labs Lab 11/18/13 1657 11/20/13 2204  LABPROT 17.1* 15.5*  INR 1.43 1.26   Anemia Panel: No results found for this basename: VITAMINB12, FOLATE, FERRITIN, TIBC, IRON, RETICCTPCT,  in the last 168 hours Urine Drug Screen: Drugs of Abuse     Component Value Date/Time   LABOPIA NONE DETECTED 10/07/2013 0018   COCAINSCRNUR NONE DETECTED 10/07/2013 0018   LABBENZ NONE DETECTED 10/07/2013 0018   AMPHETMU NONE DETECTED 10/07/2013 0018   THCU NONE DETECTED 10/07/2013 0018   LABBARB NONE DETECTED 10/07/2013 0018    Alcohol Level: No results found for this basename: ETH,  in the last 168 hours Urinalysis:  Recent Labs Lab 11/20/13 0119 11/20/13 1951  COLORURINE AMBER*  ORANGE*  LABSPEC 1.033* 1.033*  PHURINE 6.0 6.0  GLUCOSEU NEGATIVE NEGATIVE  HGBUR NEGATIVE NEGATIVE  BILIRUBINUR LARGE* LARGE*  KETONESUR 15* 15*  PROTEINUR 30* NEGATIVE  UROBILINOGEN 0.2 1.0  NITRITE POSITIVE* POSITIVE*  LEUKOCYTESUR SMALL* MODERATE*    Micro Results: Recent Results (from the past 240 hour(s))  CULTURE, BLOOD (ROUTINE X 2)     Status: None   Collection Time    11/18/13  9:45 AM      Result Value Ref Range Status   Specimen Description BLOOD LEFT ANTECUBITAL   Final   Special Requests BOTTLES DRAWN AEROBIC AND ANAEROBIC 10CC   Final   Culture  Setup Time     Final   Value: 11/18/2013 14:27     Performed at [facility]   Culture  Final   Value: NO GROWTH 5 DAYS     Performed at [facility]   Report Status 11/24/2013 FINAL   Final  CULTURE, BLOOD (ROUTINE X 2)     Status: None   Collection Time    11/18/13 11:32 AM      Result Value Ref Range Status   Specimen Description BLOOD HAND RIGHT   Final   Special Requests BOTTLES DRAWN AEROBIC AND ANAEROBIC 5CC   Final   Culture  Setup Time     Final   Value: 11/18/2013 16:09     Performed at [facility]   Culture     Final   Value: NO GROWTH 5 DAYS     Performed at [facility]   Report Status 11/24/2013 FINAL   Final  URINE CULTURE     Status: None   Collection Time    11/18/13 12:19 PM      Result Value Ref Range Status   Specimen Description URINE, CLEAN CATCH   Final   Special Requests NONE   Final   Culture  Setup Time     Final   Value: 11/18/2013 12:37     Performed at [facility] Count     Final   Value: >=100,000 COLONIES/ML     Performed at [facility]   Culture     Final   Value: Multiple bacterial morphotypes present, none predominant. Suggest appropriate recollection if clinically indicated.     Performed at [facility]   Report Status 11/20/2013 FINAL   Final  URINE CULTURE     Status: None   Collection Time     11/20/13  7:51 PM      Result Value Ref Range Status   Specimen Description URINE, RANDOM   Final   Special Requests NONE   Final   Culture  Setup Time     Final   Value: 11/21/2013 01:18     Performed at [facility] Count     Final   Value: 75,000 COLONIES/ML     Performed at [facility]   Culture     Final   Value: YEAST     Performed at [facility]   Report Status 11/22/2013 FINAL   Final  AFB CULTURE WITH SMEAR     Status: None   Collection Time    11/21/13 10:50 AM      Result Value Ref Range Status   Specimen Description BRONCHIAL WASHINGS LEFT UPPER   Final   Special Requests NONE   Final   ACID FAST SMEAR     Final   Value: NO ACID FAST BACILLI SEEN     Performed at [facility]   Culture     Final   Value: CULTURE WILL BE EXAMINED FOR 6 WEEKS BEFORE ISSUING A FINAL REPORT     Performed at [facility]   Report Status PENDING   Incomplete  CULTURE, RESPIRATORY (NON-EXPECTORATED)     Status: None   Collection Time    11/21/13 10:50 AM      Result Value Ref Range Status   Specimen Description BRONCHIAL WASHINGS LEFT UPPER   Final   Special Requests NONE   Final   Gram Stain     Final   Value: MODERATE WBC PRESENT, PREDOMINANTLY PMN     NO SQUAMOUS EPITHELIAL CELLS SEEN     NO ORGANISMS SEEN  Performed at [facility]     Final   Value: NO GROWTH 2 DAYS     Performed at [facility]   Report Status 11/24/2013 FINAL   Final  FUNGUS CULTURE W SMEAR     Status: None   Collection Time    11/21/13 10:50 AM      Result Value Ref Range Status   Specimen Description BRONCHIAL WASHINGS LEFT UPPER   Final   Special Requests NONE   Final   Fungal Smear     Final   Value: NO YEAST OR FUNGAL ELEMENTS SEEN     Performed at [facility]   Culture     Final   Value: CULTURE IN PROGRESS FOR FOUR WEEKS     Performed at [facility]   Report Status PENDING   Incomplete  AFB  CULTURE WITH SMEAR     Status: None   Collection Time    11/21/13 12:06 PM      Result Value Ref Range Status   Specimen Description TISSUE LUNG LEFT   Final   Special Requests LEFT UPPER LOBE   Final   ACID FAST SMEAR     Final   Value: NO ACID FAST BACILLI SEEN     Performed at [facility]   Culture     Final   Value: CULTURE WILL BE EXAMINED FOR 6 WEEKS BEFORE ISSUING A FINAL REPORT     Performed at [facility]   Report Status PENDING   Incomplete  FUNGUS CULTURE W SMEAR     Status: None   Collection Time    11/21/13 12:06 PM      Result Value Ref Range Status   Specimen Description TISSUE LUNG LEFT   Final   Special Requests LEFT UPPER LOBE   Final   Fungal Smear     Final   Value: NO YEAST OR FUNGAL ELEMENTS SEEN     Performed at [facility]   Culture     Final   Value: CULTURE IN PROGRESS FOR FOUR WEEKS     Performed at [facility]   Report Status PENDING   Incomplete  TISSUE CULTURE     Status: None   Collection Time    11/21/13 12:06 PM      Result Value Ref Range Status   Specimen Description TISSUE LUNG LEFT   Final   Special Requests LEFT UPPER LOBE   Final   Gram Stain     Final   Value: ABUNDANT WBC PRESENT,BOTH PMN AND MONONUCLEAR     RARE SQUAMOUS EPITHELIAL CELLS PRESENT     NO ORGANISMS SEEN     Performed at [facility]   Culture     Final   Value: NO GROWTH 2 DAYS     Performed at [facility]   Report Status PENDING   Incomplete  MRSA PCR SCREENING     Status: None   Collection Time    11/21/13  3:20 PM      Result Value Ref Range Status   MRSA by PCR NEGATIVE  NEGATIVE Final   Comment:            The GeneXpert MRSA Assay (FDA     approved for NASAL specimens     only), is one component of a     comprehensive MRSA colonization     surveillance program. It is not  intended to diagnose MRSA     infection nor to guide or     monitor treatment for     MRSA infections.   Studies/Results: Dg  Chest Port 1 View  11/24/2013   CLINICAL DATA:  Post VATS  EXAM: PORTABLE CHEST - 1 VIEW  COMPARISON:  Portable exam 0633 hr compared to 11/23/2013  FINDINGS: Left subclavian central venous catheter tip projects over SVC.  Left thoracostomy tubes unchanged.  Persistent small left pneumothorax.  Staple lines at left apex and left base post resections.  Remaining lungs clear.  No pleural effusion or acute osseous findings.  IMPRESSION: Persistent small postoperative left pneumothorax despite presence of thoracostomy tubes.   Electronically Signed   By: Ulyses Southward M.D.   On: 11/24/2013 08:14   Dg Chest Portable 1 View  11/23/2013   CLINICAL DATA:  Status post VATS procedure.  EXAM: PORTABLE CHEST - 1 VIEW  COMPARISON:  Chest x-ray 11/22/2013.  FINDINGS: There is a Left-sided subclavian central venous catheter with tip terminating in the mid superior vena cava. 2 left-sided chest tubes remain in position with tips projecting over the medial aspect of the apex of the left hemithorax. Small left apical pneumothorax (less than 5% of the volume of the left hemithorax). Postoperative changes of wedge resection in the left upper lobe again noted. Mild interstitial prominence in the lungs bilaterally, predominantly in the left upper lobe and periphery of the left lung base, in improved overall compared to prior study 11/18/2013. No pleural effusions. No evidence of pulmonary edema. Heart size is normal. Upper mediastinal contours are within normal limits.  IMPRESSION: 1. Support apparatus, as above. 2. Small left apical pneumothorax (less than 5% of the volume of the left hemithorax), with 2 left-sided chest tubes which remain appropriately positioned. 3. Decreasing interstitial prominence in lungs bilaterally, as above.   Electronically Signed   By: Trudie Reed M.D.   On: 11/23/2013 09:29   Medications: I have reviewed the patient's current medications. Scheduled Meds: . bisacodyl  10 mg Oral Daily  . fentaNYL   50 mcg Transdermal Q72H  . fentaNYL   Intravenous 6 times per day  . folic acid  1 mg Oral Daily  . ipratropium-albuterol  3 mL Nebulization TID  . levETIRAcetam  500 mg Oral BID  . methylPREDNISolone (SOLU-MEDROL) injection  60 mg Intravenous Q12H  . pantoprazole  40 mg Oral BID  . pregabalin  300 mg Oral BID  . saccharomyces boulardii  250 mg Oral BID  . vitamin B-12  2,000 mcg Oral Daily   Continuous Infusions: . dextrose 5 % and 0.45 % NaCl with KCl 20 mEq/L 20 mL/hr at 11/24/13 0600   PRN Meds:.diphenhydrAMINE, diphenhydrAMINE, naloxone, ondansetron (ZOFRAN) IV, oxyCODONE-acetaminophen, polyethylene glycol, potassium chloride, senna-docusate, sodium chloride, SUMAtriptan, traMADol  Assessment/Plan: 53 year old [place] female with past medical history of fibromyalgia, seizure disorder, and migraines with recent multiple admissions for respiratory distress who presents with acute onset chest pain and found to have sepsis with new infiltrates on chest xray.   Recurrent Pneumonitis of unknown etiology, responsive to corticosteroid therapy, s/p VATS on 3/6 - Etiology unknown, possibly due to autoimmune disease (sarcoid) vs ILD vs COP. Pt is a former smoker with FH of SLE in grandmother. No history of occupational exposures. Anti-Jo, anti- scleroderma were checked this admission and negative. She is s/p VATS with left upper lobe biopsy. There were no complications. She endorses some post-operative pain. Her anterior chest tube was removed today. Remaining  tube is on a water seal. She had a mild leukocytosis this am to 12.1. - Appreciate pulmonology and CVTS recommendations - Oxygen therapy to keep SpO2> 92 % (pt at home on 3.5L O2 PRN), currently comfortable on room air - Blood cultures x 2 (11/18/13) > NGTD  - Left lung tissue cultures (11/21/13) (AFB, fungal, tissue) > Negative to date - Left bronchial washings cultures (11/21/13) (AFB, fungal, tissue) > Negative to date - Biopsy results  pending, may take up to a week as they were sent out - Continue Duonebs TID  - IV solumedrol 60 mg BID  - Obtain hypersensitivity pneumonitis profile > in process  - Fentanyl PCA for pain - Ambulate patient TID, will order PT/OT when okay with CVTS - CBC in am - Repeat CXR in am to f/u pneumothorax  WBC  Date Value Ref Range Status  11/23/2013 12.1* 4.0 - 10.5 K/uL Final  11/22/2013 9.4  4.0 - 10.5 K/uL Final  11/20/2013 12.4* 4.0 - 10.5 K/uL Final  11/20/2013 12.3* 4.0 - 10.5 K/uL Final  11/19/2013 14.9* 4.0 - 10.5 K/uL Final    Tachycardia - HR 100-110s. No fever, satting 98-100% on room air. Likely 2/2 pain.  - Treating pain as above - Continue to monitor  Hypokalemia - K 2.9 on 3/5 which improved with repletion. K 3.4 this morning. Etiology likely due to GI loss (emesis, diarrhea) vs inadequate nutrition. Mag 2.2 (wnl). - BMP in am - Replete as necessary   Symptomatic UTI, resolved - S/p antibiotics (IV Rocephin). Initial urine culture with 100K multiple bacterial morphotypes present. Repeat urine culture (clean catch) showed only 75,000 col/ml yeast. - Continue Pyridium 100mg  TID   Acute on chronic epigastric pain - Had increased epigastric pain during this hospitalization. Etiology unknown, most likely due to gastritis (chronic etodolac use) vs PUD vs GERD. No prior EGD testing.  - IV Zofran Q 6hr for nausea  - Continue protonix 40 mg BID  - Miralax, senokot daily PRN constipation  - Advance diet as tolerated  Pleuritic Chest Pain - Pt with similar complaint at prior hospitalization. Pt with no history of CAD. Pt with chronic tenderness to palpation on exam suggestive of possible costochondritis. D-dimer was elevated (2.23) however recent CTA chest on 2/15 with no PE. Troponins were negative and no ischemic changes on 12-lead EKG.  - Pain control as above - Continue home tramadol 50 mg BID PRN pain  - Continue home etodolac 500 mg BID  - Continue home fentanyl 50 mcg patch Q 72 hrs   - Monitor on telemetry   History of Grade 1 Diastolic CHF - 2D-echo on 1/28 with EF 65-70% with grade 1 diastolic dysfunction (not present on 2/16 2D-echo) PT with elevated Pro-BNP 585.4 but lower than previous admission (3533). She does have some lower extremity edema today, 1+ to the thighs, likely from fluid resuscitation post op. No other signs of volume overload. - Place and maintain TED stockings - Monitor daily weights > 122lbs - Ins and outs > Net -285cc for this admission  C difficile Infection - Resolved. Pt with + c diff toxin on last hospitalization and completed treatment after discharge (2/19 for 10 days). Pt received metronidazole (PO then IV) on 3/3-3/5 for total 2 days.  - Continue probiotic 250 mg BID   Seizure Disorder - Pt with no recent seizure activity.  - Continue keppra 500 mg BID   Fibromyalgia - Pt with diffuse body aches and pain unchanged from baseline.  -  Continue home tramadol 50 mg BID PRN pain  - Continue home etodolac 500 mg BID  - Continue home fentanyl 50 mcg patch Q 72 hrs  - Continue home pregabalin 300 mg BID   Migraine headache - Currently without migraine headache. - Continue home sumatriptan Q 2hr PRN  - Continue pregabalin and Keppra as above  Chronic Normocytic Anemia with folate/B12 deficiency - Hg baseline of 10. 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 Pt with no recent active bleeding or hemodynamic instability.  - Monitor for bleeding  - Transfuse if Hg<7  - Continue folate 1 mg daily  - Continue B12 supplements 2000 mcg daily  - SCDs for DVT ppx  Hemoglobin  Date Value Ref Range Status  11/23/2013 9.2* 12.0 - 15.0 g/dL Final  10/28/5619 9.5* 30.8 - 15.0 g/dL Final  02/20/7845 96.2* 95.2 - 15.0 g/dL Final  04/22/1323 40.1* 02.7 - 15.0 g/dL Final  10/23/3662 40.3* 47.4 - 15.0 g/dL Final    Vitamin D Deficiency - Pt with recent 25-OH Vitamin D of 13.  - Continue vitamin D 50,000 units every 7 days  Severe  Malnutrition in setting of hypoalbuminemia - Pt with chronic low albumin levels. Etiology most likley due to chronic protein malnutrition vs falsely low in setting of acute infection (acute phase reactant).  - Ensure Complete BID   Diet: Regular   DVT Ppx: SCDs   Code: Full   Dispo: Disposition is deferred at this time, awaiting improvement of current medical problems.  Anticipated discharge in approximately 2-5 day(s).   The patient does have a current PCP Rocco Serene, MD) and does need an [facility] hospital follow-up appointment after discharge.  The patient does not have transportation limitations that hinder transportation to clinic appointments.  .Services Needed at time of discharge: Y = Yes, Blank = No PT:   OT:   RN:   Equipment:   Other:     LOS: 6 days   Vivi Barrack, MD 11/24/2013, 1:16 PM

## 2013-11-24 NOTE — Progress Notes (Addendum)
      [address]       [place] [id number]             [phone]      3 Days Post-Op Procedure(s) (LRB): VIDEO BRONCHOSCOPY (N/A) VIDEO ASSISTED THORACOSCOPY (Left) LUNG BIOPSY (Left)  Subjective:  [name] continues to have pain at chest tube sites.   Objective: Vital signs in last 24 hours: Temp:  [97.8 F (36.6 C)-99.1 F (37.3 C)] 98.4 F (36.9 C) (03/09 0744) Pulse Rate:  [84-108] 85 (03/09 0744) Cardiac Rhythm:  [-] Normal sinus rhythm (03/09 0800) Resp:  [13-22] 22 (03/09 0744) BP: (111-141)/(66-89) 141/80 mmHg (03/09 0744) SpO2:  [99 %-100 %] 99 % (03/09 0932)  Intake/Output from previous day: 03/08 0701 - 03/09 0700 In: 1021.7 [P.O.:600; I.V.:421.7] Out: 675 [Urine:575; Chest Tube:100] Intake/Output this shift: Total I/O In: 40 [I.V.:40] Out: 0   General appearance: alert, cooperative and no distress Heart: regular rate and rhythm Lungs: clear to auscultation bilaterally Abdomen: soft, non-tender; bowel sounds normal; no masses,  no organomegaly Wound: clean and dry  Lab Results:  Recent Labs  11/22/13 0355 11/23/13 0432  WBC 9.4 12.1*  HGB 9.5* 9.2*  HCT 29.9* 29.7*  PLT 339 332   BMET:  Recent Labs  11/22/13 0355 11/23/13 0432  NA 136* 144  K 4.3 3.4*  CL 101 109  CO2 26 24  GLUCOSE 124* 102*  BUN 11 8  CREATININE 0.59 0.51  CALCIUM 8.4 7.2*    PT/INR: No results found for this basename: LABPROT, INR,  in the last 72 hours ABG    Component Value Date/Time   PHART 7.411 11/22/2013 0320   HCO3 27.0* 11/22/2013 0320   TCO2 28.3 11/22/2013 0320   ACIDBASEDEF 1.0 10/17/2013 0952   O2SAT 99.1 11/22/2013 0320   CBG (last 3)  No results found for this basename: GLUCAP,  in the last 72 hours  Assessment/Plan: S/P Procedure(s) (LRB): VIDEO BRONCHOSCOPY (N/A) VIDEO ASSISTED THORACOSCOPY (Left) LUNG BIOPSY (Left)  1. Chest tube- no air leak present, order was placed to remove anterior chest tube yesterday, however this  was not completed, CXR with stable appearance of left apical pneumothorax- will remove anterior chest tube today 2. Pain- on PCA and oral medication 3. Pathology pending, cultures remain negative 4. Care per Pulmonary   LOS: 6 days    [name] DandyBARRETT, ERIN 11/24/2013  Path pending, tissue cultures 3/6 negative so far  I have seen and examined [name] [name] and agree with the above assessment  and plan.  [name] OvensEdward B Maciah Schweigert MD [facility] [phone] Office [phone] 11/24/2013 10:11 AM

## 2013-11-24 NOTE — Progress Notes (Signed)
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 Just discharged on 2/19 w/ recommendations for steroid taper w/ suggestion that she would need Open lung bx should this re-occur. At time of d/c her CXR was essentially clear. She was re-admitted on 3/3 w/ CC: 1 d/ h/o chest pain and progressive dyspnea. CXR w/ reoccurrence of bilateral pulmonary infiltrates. PCCM was asked to eval.    SIGNIFICANT EVENTS/STUDIES:  1/28 - CTA chest>>>No PE. Bilateral prominently upper lobe mixed airspace and ground-glass opacities, multi lobar pneumonia is favored. Mediastinal and bilateral hilar adenopathy  1/28 - Bronchoscopy >>> 6688 WBC (93% neutrophils), Cx negative 1/28 - TTE >>> EF 65-70%, grade 1 DD, PA peak 38, no change from prior   1/28 - Labs >>> P ANCA 1:20, ANA 1:80, DS DNA 1, RF 13, ESR 98 2/07 - Labs >>> CPK 31, SS-A < 1, SS-B < 1, ACE 14 2/09 - Labs >>> P ANCA 1:20, MPO 1.1 2/15 - Chest CT >>> No pulmonary embolism, persistent upper lobe predominant bilateral airspace and ground-glass opacities, improved relative to most recent CT from 1/28, mediastinal and bilateral adenopathy 2/16 Echo  >>> EF 78%, grade 1 systolic dysfunction  .................................................................................................................................................... 3/03 - Admit with CP & Progressive dyspnea.  CT chest >>> diffuse b/l GGO with upper lobe predominance 3/6  VATS bx >>>  LINES / TUBES: OETT 3/6>>>3/6 Aline 3/6>>> L [place] CVL 3/6>>> L CT x 2 3/6>>>  CULTURES: Blood 3/03 >>>  Urine 3/5> yeast x 75 k Bronchial Washings 3/6 >>> NEG Tissue culture 3/6 >>>  Autoimmune:  AntiSCL70 -  neg  Jo-1 - neg  Anti-ds DNA - neg ANA - POS/ homogeneous and Anti DNA Pos only at 1 dilution c-ANCA - neg P-ANCA - POS RF - neg  PCP - neg  C3 complement - wnl C4 complement - wnl   ANTIBIOTICS: Cefepime 3/3 x1 vanc 3/3 >>>3/5 Flagyl 3/3 >>>3/05 Cefuroxime 3/5 > 3/6  SUBJECTIVE:    Complains of pain all over, but specifically at chest tube sites. No respiratory distress at this time.   VITAL SIGNS: Temp:  [97.8 F (36.6 C)-99.1 F (37.3 C)] 98.4 F (36.9 C) (03/09 0744) Pulse Rate:  [84-108] 85 (03/09 0744) Resp:  [13-24] 24 (03/09 1148) BP: (111-141)/(66-89) 141/80 mmHg (03/09 0744) SpO2:  [97 %-100 %] 97 % (03/09 1148) FIO2  2lpm NP  PHYSICAL EXAMINATION: General:  Chronically ill appearing Neuro: normal strength, generalized weakness HEENT: no sinus tenderness Cardiovascular: regular, tachycardic Lungs:  Clear anteriorly Abdomen:  Soft, mild tenderness Musculoskeletal: 1+ edema Skin: no rashes   PULMONARY  Recent Labs Lab 11/20/13 2123 11/21/13 1423 11/22/13 0320  PHART 7.477* 7.413 7.411  PCO2ART 38.1 42.9 43.4  PO2ART 59.7* 178.0* 118.0*  HCO3 27.8* 27.3* 27.0*  TCO2 29.0 28.7 28.3  O2SAT 90.9 100.0 99.1    CBC  Recent Labs Lab 11/20/13 2204 11/22/13 0355 11/23/13 0432  HGB 10.2* 9.5* 9.2*  HCT 31.5* 29.9* 29.7*  WBC 12.4* 9.4 12.1*  PLT 424* 339 332    COAGULATION  Recent Labs Lab 11/18/13 1657 11/20/13 2204  INR 1.43 1.26    CARDIAC    Recent Labs Lab 11/18/13 1659 11/18/13 2300  TROPONINI <  0.30 <0.30    Recent Labs Lab 11/18/13 0945  PROBNP 585.4*     CHEMISTRY  Recent Labs Lab 11/20/13 0215 11/20/13 1145 11/20/13 2204 11/21/13 0245 11/22/13 0355 11/23/13 0432  NA 143 140 138 137 136* 144  K 2.9* 3.6* 3.8 3.8 4.3 3.4*  CL 100 101 100 100 101 109  CO2 '30 28 26 26 26 24  ' GLUCOSE 183* 155* 103* 80 124* 102*  BUN '14 15 16 15 11 8  ' CREATININE 0.62 0.68 0.61 0.63 0.59 0.51  CALCIUM 9.2 8.8 9.3 8.8 8.4  7.2*  MG 1.8  --   --  1.6 2.2  --    Estimated Creatinine Clearance: 62.9 ml/min (by C-G formula based on Cr of 0.51).   LIVER  Recent Labs Lab 11/18/13 0945 11/18/13 1657 11/20/13 2204 11/23/13 0432  AST 16  --  8 9  ALT 7  --  5 5  ALKPHOS 119*  --  81 51  BILITOT 0.5  --  0.2* <0.2*  PROT 7.9  --  6.9 4.8*  ALBUMIN 2.5*  --  2.5* 1.8*  INR  --  1.43 1.26  --      INFECTIOUS  Recent Labs Lab 11/18/13 0958 11/20/13 1145  LATICACIDVEN 1.93  --   PROCALCITON  --  0.20     ENDOCRINE CBG (last 3)  No results found for this basename: GLUCAP,  in the last 72 hours   IMAGING x48h  Dg Chest Port 1 View  11/24/2013   CLINICAL DATA:  Post VATS  EXAM: PORTABLE CHEST - 1 VIEW  COMPARISON:  Portable exam 0633 hr compared to 11/23/2013  FINDINGS: Left subclavian central venous catheter tip projects over SVC.  Left thoracostomy tubes unchanged.  Persistent small left pneumothorax.  Staple lines at left apex and left base post resections.  Remaining lungs clear.  No pleural effusion or acute osseous findings.  IMPRESSION: Persistent small postoperative left pneumothorax despite presence of thoracostomy tubes.   Electronically Signed   By: Lavonia Dana M.D.   On: 11/24/2013 08:14   Dg Chest Portable 1 View  11/23/2013   CLINICAL DATA:  Status post VATS procedure.  EXAM: PORTABLE CHEST - 1 VIEW  COMPARISON:  Chest x-ray 11/22/2013.  FINDINGS: There is a Left-sided subclavian central venous catheter with tip terminating in the mid superior vena cava. 2 left-sided chest tubes remain in position with tips projecting over the medial aspect of the apex of the left hemithorax. Small left apical pneumothorax (less than 5% of the volume of the left hemithorax). Postoperative changes of wedge resection in the left upper lobe again noted. Mild interstitial prominence in the lungs bilaterally, predominantly in the left upper lobe and periphery of the left lung base, in improved overall compared to  prior study 11/18/2013. No pleural effusions. No evidence of pulmonary edema. Heart size is normal. Upper mediastinal contours are within normal limits.  IMPRESSION: 1. Support apparatus, as above. 2. Small left apical pneumothorax (less than 5% of the volume of the left hemithorax), with 2 left-sided chest tubes which remain appropriately positioned. 3. Decreasing interstitial prominence in lungs bilaterally, as above.   Electronically Signed   By: Vinnie Langton M.D.   On: 11/23/2013 09:29      ASSESSMENT / PLAN:  Recurrent pneumonitis of unclear etiology - partially responsive to steroids.  ?ILD? SLE  S/p VATS with LUL bx. 3/6 Acute Respiratory Failure/ 02 dep post op Pleuritic Chest Pain   P: -taper  solumedrol. Decrease to 60 q 12h today.  -follow VATS cultures / biopsy results -Supplemental O2 as needed to keep O2 sat >92% Currently comfortable on room air.  -continue bronchodilators -fentanyl PCA for pain  -push pulmonary hygiene -Biopsy result pending-- will take 1 week at least due to send out to Dr Dian Situ   Hypokalemia Hypomagnesemia P: -monitor BMP / Mg -replace lytes as indicated   UTI - post course rocephin IV Hx of Recent C-Diff Infection - Resolved.  P: -off ABX 3/6 -continue florastor -continue pyridium    GERD Protein Calorie Malnutrition  Chronic Epigastric Pain - thought related to gastritis Nausea / Vomiting Constipation  P: -PPI -bowel regimen-->miralax, senokot -advance diet as tol - consider ST eval  -zofran for nausea   Chronic Pain  Fibromyalgia Hx seizures Migraine Headaches  P: -continue lyrica, keppra, imitrex -fentanyl PCA -PRN Oxy IR,  Percocet and ultram -pt/ot when ok with CVTS  Anemia  P: -SCD's for DVT proph  -continue B12   GLOBAL: -PCCM SVC while in ICU, will go back to IM TS post transfer from ICU. PCCM will continue to follow -will need PT consult once cleared from CVT.   Georgann Housekeeper, ACNP [place]  Pulmonology/Critical Care Pager [phone] or [phone]   Patient seen and examined, agree with above note.  I dictated the care and orders written for this patient under my direction.  Rush Farmer, MD [phone]

## 2013-11-24 NOTE — Op Note (Signed)
[name] [name], MERIDA NO.:  [id number]  MEDICAL RECORD NO.:  [id number]  LOCATION:  3S15C                        FACILITY:  [facility]  PHYSICIAN:  Sheliah Plane, MD    DATE OF BIRTH:  07/01/1961  DATE OF PROCEDURE:  11/21/2013 DATE OF DISCHARGE:                              OPERATIVE REPORT   PREOPERATIVE DIAGNOSIS:  Bilateral interstitial lung disease with respiratory failure.  POSTOPERATIVE DIAGNOSIS:  Bilateral interstitial lung disease with respiratory failure.  SURGICAL PROCEDURE:  Bronchoscopy, left video-assisted thoracoscopy with wedge resections of the upper lobe x2 and the lower lobe x1.  SURGEON:  Sheliah Plane, M.D.  FIRST ASSISTANT:  Coral Ceo, P.A.  BRIEF HISTORY:  The patient is a 53 year old female who has had multiple admissions for respiratory problems with diffuse pulmonary infiltrates bilaterally.  Because she has been treated with antibiotics and steroids with little effect, she was referred by Dr. Craige Cotta to consider lung biopsy.  Risks and options were discussed with the patient, and she was agreeable and signed informed consent.  DESCRIPTION OF PROCEDURE:  After appropriate time-out, the patient underwent general endotracheal anesthesia.  Through a single-lumen endotracheal tube, a fiberoptic bronchoscope was passed to the subsegmental level.  The endobronchial appearance was normal with no endobronchial lesions and very little in the way of secretions. Bronchial washings were obtained for culture from the left upper lobe. The scope was removed.  A double-lumen endotracheal tube was then placed.  The patient was then turned in a lateral decubitus position with the left side up.  Skin of the chest and legs was prepped with Betadine and draped in sterile manner.  Three port incisions were created, one in approximately fifth intercostal space mid axillary line, one more superior and anterior, and one more superior and  posterior. Through these 3 ports, the left pleural space was examined.  Considering the findings on CT scan, the upper lobe did not appear as abnormal as would be suspected from the CT scan.  Along the major fissure, there were several barleycorn size nodules on the lung, one of these was selected in a wedge resection including the area of small nodule which was wedged with purple stapler and submitted to Pathology.  The nodule itself was very firm and appeared calcified.  Additional wedge resection from the superior part of the upper lobe was also performed, and a random lung biopsy from the left lower lobe was also performed in a similar fashion with wedge resection using staplers.  The parietal pleura appeared normal.  There was no abnormal studding or inflammation of nodules appreciated.  Two Blake drains were left in, 1 to the anterior port, 1 to the middle port; the posterior port was closed with an absorbable stitch in the deep muscle layer and a 3-0 subcuticular, and Dermabond was applied.  The patient's lung reinflated nicely.  She was then awakened and extubated in the operating room.  She tolerated the procedure without obvious complication.  Blood loss was minimal.  Sponge and needle count was reported as correct at completion of the procedure.     Sheliah Plane, MD     EG/MEDQ  D:  11/24/2013  T:  11/24/2013  Job:  [id number]  cc:   Coralyn HellingVINEET SOOD

## 2013-11-24 NOTE — Clinical Social Work Psychosocial (Signed)
Clinical Social Work Department BRIEF PSYCHOSOCIAL ASSESSMENT 11/24/2013  Patient:  Kubicek,Mercer     Account Number:  [id number]     Admit date:  11/18/2013  Clinical Social Worker:  Lovey Newcomer  Date/Time:  11/24/2013 04:15 PM  Referred by:  Physician  Date Referred:  11/24/2013 Referred for  SNF Placement   Other Referral:   Interview type:  Patient Other interview type:   Patient alert and oriented at time of assessment.    PSYCHOSOCIAL DATA Living Status:  FAMILY Admitted from facility:   Level of care:   Primary support name:  Katharine Look Primary support relationship to patient:  SIBLING Degree of support available:   Patient has strong family support. Patient lives with sister and mother, both are at bedside.    CURRENT CONCERNS Current Concerns  Post-Acute Placement   Other Concerns:    SOCIAL WORK ASSESSMENT / PLAN CSW met with patient at bedside. Patient's mother and sister were present for assessment. Patient states that she moved to the [place] a little over 6 months ago. Prior to coming to the [place] patient was living in [place] and moved to the [place] to be closer to her family. Patient states that she has another sister who still lives in [place]. [place] explained that PT has recommended SNF placement for patient. CSW explained LOG SNF placement process to patient and that a placement in this area cannot be guaranteed. Patient asked about the possibility of receiving HHPT, CSW explained that this option would not be available unless patient could pay privately for this services. Patient states that she hopes that a LOG will be accepted by a local SNF as she does not want to be too far from her family. CSW validated patient's concerns and explained that every effort will be made to get her a SNF placement as close to home as possible but it could not be guaranteed. CSW answered patient's questions.   Assessment/plan status:  Psychosocial Support/Ongoing  Assessment of Needs Other assessment/ plan:   Complete FL2, Fax, Make LOG phone calls, PASRR   Information/referral to community resources:   CSW contact information given to patient.    PATIENT'S/FAMILY'S RESPONSE TO PLAN OF CARE: Patient is agreeable to SNF placement but is apprehensive about placement too far from home. Patient would be agreeable to placement in [facility] and surrounding counties. Patient was pleasant, appropriate, and appreciative of CSW contact. CSW will follow up with bed offer when available.       Liz Beach, [place], [place], [phone]

## 2013-11-25 ENCOUNTER — Inpatient Hospital Stay (HOSPITAL_COMMUNITY): Payer: Medicaid Other

## 2013-11-25 ENCOUNTER — Encounter (HOSPITAL_COMMUNITY): Payer: Self-pay | Admitting: Cardiothoracic Surgery

## 2013-11-25 DIAGNOSIS — E876 Hypokalemia: Secondary | ICD-10-CM

## 2013-11-25 DIAGNOSIS — G43909 Migraine, unspecified, not intractable, without status migrainosus: Secondary | ICD-10-CM

## 2013-11-25 DIAGNOSIS — D509 Iron deficiency anemia, unspecified: Secondary | ICD-10-CM

## 2013-11-25 LAB — BASIC METABOLIC PANEL
BUN: 11 mg/dL (ref 6–23)
BUN: 9 mg/dL (ref 6–23)
CALCIUM: 8.2 mg/dL — AB (ref 8.4–10.5)
CHLORIDE: 104 meq/L (ref 96–112)
CO2: 31 mEq/L (ref 19–32)
CO2: 28 meq/L (ref 19–32)
Calcium: 8.5 mg/dL (ref 8.4–10.5)
Chloride: 103 mEq/L (ref 96–112)
Creatinine, Ser: 0.51 mg/dL (ref 0.50–1.10)
Creatinine, Ser: 0.51 mg/dL (ref 0.50–1.10)
GFR calc Af Amer: >90 mL/min (ref >90)
GFR calc non Af Amer: >90 mL/min (ref >90)
GFR calc non Af Amer: >90 mL/min (ref >90)
GLUCOSE: 133 mg/dL — AB (ref 70–99)
Glucose, Bld: 93 mg/dL (ref 70–99)
Potassium: 3.1 mEq/L — ABNORMAL LOW (ref 3.7–5.3)
Potassium: 3.9 mEq/L (ref 3.7–5.3)
SODIUM: 141 meq/L (ref 137–147)
Sodium: 142 mEq/L (ref 137–147)

## 2013-11-25 LAB — TISSUE CULTURE: Culture: NO GROWTH

## 2013-11-25 LAB — CBC
HCT: 28.4 % — ABNORMAL LOW (ref 36.0–46.0)
Hemoglobin: 8.8 g/dL — ABNORMAL LOW (ref 12.0–15.0)
MCH: 27.3 pg (ref 26.0–34.0)
MCHC: 31 g/dL (ref 30.0–36.0)
MCV: 88.2 fL (ref 78.0–100.0)
PLATELETS: 316 10*3/uL (ref 150–400)
RBC: 3.22 MIL/uL — ABNORMAL LOW (ref 3.87–5.11)
RDW: 18.2 % — AB (ref 11.5–15.5)
WBC: 13.1 10*3/uL — ABNORMAL HIGH (ref 4.0–10.5)

## 2013-11-25 LAB — MAGNESIUM: MAGNESIUM: 1.5 mg/dL (ref 1.5–2.5)

## 2013-11-25 MED ORDER — ADULT MULTIVITAMIN W/MINERALS CH
1.0000 | ORAL_TABLET | Freq: Every day | ORAL | Status: DC
  Administered 2013-11-25 – 2013-11-28 (×4): 1 via ORAL
  Filled 2013-11-25 (×4): qty 1

## 2013-11-25 MED ORDER — METHYLPREDNISOLONE SODIUM SUCC 40 MG IJ SOLR
30.0000 mg | Freq: Two times a day (BID) | INTRAMUSCULAR | Status: DC
  Administered 2013-11-25 – 2013-11-26 (×2): 30 mg via INTRAVENOUS
  Filled 2013-11-25 (×4): qty 0.75

## 2013-11-25 MED ORDER — POTASSIUM CHLORIDE 20 MEQ/15ML (10%) PO LIQD
40.0000 meq | ORAL | Status: DC
  Filled 2013-11-25 (×2): qty 30

## 2013-11-25 MED ORDER — MAGNESIUM SULFATE 40 MG/ML IJ SOLN
2.0000 g | Freq: Once | INTRAMUSCULAR | Status: AC
  Administered 2013-11-26: 2 g via INTRAVENOUS
  Filled 2013-11-25: qty 50

## 2013-11-25 MED ORDER — POTASSIUM CHLORIDE CRYS ER 20 MEQ PO TBCR
40.0000 meq | EXTENDED_RELEASE_TABLET | ORAL | Status: AC
  Administered 2013-11-25 (×2): 40 meq via ORAL
  Filled 2013-11-25 (×2): qty 2

## 2013-11-25 NOTE — Progress Notes (Signed)
Subjective:  Pt seen and examined in AM. Pt reports feeling great and is doing very well after surgery. Her breathing has improved and reports no fevers, chills, or cough. She has left chest wall pain at site of her chest tube site which she is receiving Fentanyl PCA for pain.  Her urinary burning has resolved. She reports mild nausea that improved with no vomiting, abdominal pain, or change in BM. Pt reports she does not want to leave the hospital until her biopsy results come back.      Objective: Vital signs in last 24 hours: Filed Vitals:   11/25/13 0323 11/25/13 0400 11/25/13 0736 11/25/13 0840  BP: 148/89     Pulse: 85     Temp: 98.5 F (36.9 C)  98.7 F (37.1 C)   TempSrc: Oral  Oral   Resp: 23 19    Height:      Weight:      SpO2: 99% 100%  100%   Weight change:   Intake/Output Summary (Last 24 hours) at 11/25/13 1139 Last data filed at 11/25/13 1100  Gross per 24 hour  Intake 1350.12 ml  Output    870 ml  Net 480.12 ml    Physical Exam: Vitals reviewed.  General:  NAD Cardiac: normal rate and rhythm   Pulm: clear to ausculation, posterior chest tube in place with no air leak Abd: soft, nontender, nondistended, BS present  Ext: +1 nonpitting b/l edema   Neuro: alert and oriented X3, moves 4 extremities voluntarily, generalized weakness  Lab Results: Basic Metabolic Panel:  Recent Labs Lab 11/21/13 0245 11/22/13 0355 11/23/13 0432 11/25/13 0440  NA 137 136* 144 142  K 3.8 4.3 3.4* 3.1*  CL 100 101 109 103  CO2 26 26 24 31   GLUCOSE 80 124* 102* 93  BUN 15 11 8 11   CREATININE 0.63 0.59 0.51 0.51  CALCIUM 8.8 8.4 7.2* 8.2*  MG 1.6 2.2  --   --    Liver Function Tests:  Recent Labs Lab 11/20/13 2204 11/23/13 0432  AST 8 9  ALT 5 5  ALKPHOS 81 51  BILITOT 0.2* <0.2*  PROT 6.9 4.8*  ALBUMIN 2.5* 1.8*   CBC:  Recent Labs Lab 11/23/13 0432 11/25/13 0440  WBC 12.1* 13.1*  HGB 9.2* 8.8*  HCT 29.7* 28.4*  MCV 87.4 88.2  PLT 332 316     Cardiac Enzymes:  Recent Labs Lab 11/18/13 1659 11/18/13 2300  TROPONINI <0.30 <0.30   Coagulation:  Recent Labs Lab 11/18/13 1657 11/20/13 2204  LABPROT 17.1* 15.5*  INR 1.43 1.26    Micro Results: Recent Results (from the past 240 hour(s))  CULTURE, BLOOD (ROUTINE X 2)     Status: None   Collection Time    11/18/13  9:45 AM      Result Value Ref Range Status   Specimen Description BLOOD LEFT ANTECUBITAL   Final   Special Requests BOTTLES DRAWN AEROBIC AND ANAEROBIC 10CC   Final   Culture  Setup Time     Final   Value: 11/18/2013 14:27     Performed at [facility]   Culture     Final   Value: NO GROWTH 5 DAYS     Performed at [facility]   Report Status 11/24/2013 FINAL   Final  CULTURE, BLOOD (ROUTINE X 2)     Status: None   Collection Time    11/18/13 11:32 AM      Result  Value Ref Range Status   Specimen Description BLOOD HAND RIGHT   Final   Special Requests BOTTLES DRAWN AEROBIC AND ANAEROBIC 5CC   Final   Culture  Setup Time     Final   Value: 11/18/2013 16:09     Performed at [facility]   Culture     Final   Value: NO GROWTH 5 DAYS     Performed at [facility]   Report Status 11/24/2013 FINAL   Final  URINE CULTURE     Status: None   Collection Time    11/18/13 12:19 PM      Result Value Ref Range Status   Specimen Description URINE, CLEAN CATCH   Final   Special Requests NONE   Final   Culture  Setup Time     Final   Value: 11/18/2013 12:37     Performed at [facility] Count     Final   Value: >=100,000 COLONIES/ML     Performed at [facility]   Culture     Final   Value: Multiple bacterial morphotypes present, none predominant. Suggest appropriate recollection if clinically indicated.     Performed at [facility]   Report Status 11/20/2013 FINAL   Final  URINE CULTURE     Status: None   Collection Time    11/20/13  7:51 PM      Result Value Ref Range Status    Specimen Description URINE, RANDOM   Final   Special Requests NONE   Final   Culture  Setup Time     Final   Value: 11/21/2013 01:18     Performed at [facility] Count     Final   Value: 75,000 COLONIES/ML     Performed at [facility]   Culture     Final   Value: YEAST     Performed at [facility]   Report Status 11/22/2013 FINAL   Final  AFB CULTURE WITH SMEAR     Status: None   Collection Time    11/21/13 10:50 AM      Result Value Ref Range Status   Specimen Description BRONCHIAL WASHINGS LEFT UPPER   Final   Special Requests NONE   Final   ACID FAST SMEAR     Final   Value: NO ACID FAST BACILLI SEEN     Performed at [facility]   Culture     Final   Value: CULTURE WILL BE EXAMINED FOR 6 WEEKS BEFORE ISSUING A FINAL REPORT     Performed at [facility]   Report Status PENDING   Incomplete  CULTURE, RESPIRATORY (NON-EXPECTORATED)     Status: None   Collection Time    11/21/13 10:50 AM      Result Value Ref Range Status   Specimen Description BRONCHIAL WASHINGS LEFT UPPER   Final   Special Requests NONE   Final   Gram Stain     Final   Value: MODERATE WBC PRESENT, PREDOMINANTLY PMN     NO SQUAMOUS EPITHELIAL CELLS SEEN     NO ORGANISMS SEEN     Performed at [facility]   Culture     Final   Value: NO GROWTH 2 DAYS     Performed at [facility]   Report Status 11/24/2013 FINAL   Final  FUNGUS CULTURE W SMEAR     Status: None  Collection Time    11/21/13 10:50 AM      Result Value Ref Range Status   Specimen Description BRONCHIAL WASHINGS LEFT UPPER   Final   Special Requests NONE   Final   Fungal Smear     Final   Value: NO YEAST OR FUNGAL ELEMENTS SEEN     [facility]   Culture     Final   Value: CULTURE IN PROGRESS FOR FOUR WEEKS     [facility]formed at [facility]   Report Status PENDING   Incomplete  AFB CULTURE WITH SMEAR     Status: None   Collection Time     11/21/13 12:06 PM      Result Value Ref Range Status   Specimen Description TISSUE LUNG LEFT   Final   Special Requests LEFT UPPER LOBE   Final   ACID FAST SMEAR     Final   Value: NO ACID FAST BACILLI SEEN     [facility]formed at [facility]   Culture     Final   Value: CULTURE WILL BE EXAMINED FOR 6 WEEKS BEFORE ISSUING A FINAL REPORT     [facility]formed [facility]   Report Status PENDING   Incomplete  FUNGUS CULTURE W SMEAR     Status: None   Collection Time    11/21/13 12:06 PM      Result Value Ref Range Status   Specimen Description TISSUE LUNG LEFT   Final   Special Requests LEFT UPPER LOBE   Final   Fungal Smear     Final   Value: NO YEAST OR FUNGAL ELEMENTS SEEN     [facility]   Culture     Final   Value: CULTURE IN PROGRESS FOR FOUR WEEKS     [facility]formed at [facility]   Report Status PENDING   Incomplete  TISSUE CULTURE     Status: None   Collection Time    11/21/13 12:06 PM      Result Value Ref Range Status   Specimen Description TISSUE LUNG LEFT   Final   Special Requests LEFT UPPER LOBE   Final   Gram Stain     Final   Value: ABUNDANT WBC PRESENT,BOTH PMN AND MONONUCLEAR     RARE SQUAMOUS EPITHELIAL CELLS PRESENT     NO ORGANISMS SEEN     [facility]formed [facility]   Culture     Final   Value: NO GROWTH 3 DAYS     [facility]formed at [facility]   Report Status 11/25/2013 FINAL   Final  MRSA PCR SCREENING     Status: None   Collection Time    11/21/13  3:20 PM      Result Value Ref Range Status   MRSA by PCR NEGATIVE  NEGATIVE Final   Comment:            The GeneXpert MRSA Assay (FDA     approved for NASAL specimens     only), is one component of a     comprehensive MRSA colonization     surveillance program. It is not     intended to diagnose MRSA     infection nor to guide or     monitor treatment for     MRSA infections.   Studies/Results: Dg Chest Port 1 View  11/25/2013   CLINICAL DATA:  Chest  tube  EXAM: PORTABLE CHEST -  1 VIEW  COMPARISON:  Prior chest x-ray 11/24/2013  FINDINGS: Stable position of left subclavian approach central venous catheter. The catheter tip projects over the upper SVC min is directed toward the caval wall. Interval removal of the more superior left chest tube. The more inferior left chest tube has been retracted slightly. No interval change in small left apical pneumothorax. Surgical changes of left upper and lower lobe wedge resections with residual chain sutures. The right lung remains clear. Cardiac and mediastinal contours are within normal limits. No acute osseous abnormality.  IMPRESSION: 1. Stable trace left apical pneumothorax following removal of the more superior of the 2 left chest tubes. The more inferior chest tube has pulled back slightly but remains intra thoracic. 2. Unchanged position of left subclavian central venous catheter. 3. No significant interval change in the appearance of the left lung with expected postsurgical changes.   Electronically Signed   By: Malachy Moan M.D.   On: 11/25/2013 08:20   Dg Chest Port 1 View  11/24/2013   CLINICAL DATA:  Post VATS  EXAM: PORTABLE CHEST - 1 VIEW  COMPARISON:  Portable exam 0633 hr compared to 11/23/2013  FINDINGS: Left subclavian central venous catheter tip projects over SVC.  Left thoracostomy tubes unchanged.  Persistent small left pneumothorax.  Staple lines at left apex and left base post resections.  Remaining lungs clear.  No pleural effusion or acute osseous findings.  IMPRESSION: Persistent small postoperative left pneumothorax despite presence of thoracostomy tubes.   Electronically Signed   By: Ulyses Southward M.D.   On: 11/24/2013 08:14   Medications: I have reviewed the patient's current medications. Scheduled Meds: . bisacodyl  10 mg Oral Daily  . fentaNYL  50 mcg Transdermal Q72H  . fentaNYL   Intravenous 6 times per day  . folic acid  1 mg Oral Daily  . ipratropium-albuterol  3 mL  Nebulization TID  . levETIRAcetam  500 mg Oral BID  . methylPREDNISolone (SOLU-MEDROL) injection  30 mg Intravenous Q12H  . pantoprazole  40 mg Oral BID  . potassium chloride  40 mEq Oral Q4H  . pregabalin  300 mg Oral BID  . saccharomyces boulardii  250 mg Oral BID  . vitamin B-12  2,000 mcg Oral Daily   Continuous Infusions: . dextrose 5 % and 0.45 % NaCl with KCl 20 mEq/L 20 mL/hr at 11/24/13 0600   PRN Meds:.diphenhydrAMINE, diphenhydrAMINE, naloxone, ondansetron (ZOFRAN) IV, oxyCODONE-acetaminophen, polyethylene glycol, potassium chloride, senna-docusate, sodium chloride, SUMAtriptan, traMADol  Assessment/Plan: 53 year old [place] female with past medical history of fibromyalgia, seizure disorder, and migraines with recent multiple admissions for respiratory distress who presents with acute onset chest pain and found to have sepsis with new infiltrates on chest xray.   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 Etiology unknown, possibly due to autoimmune disease (sarcoid) vs ILD vs COP. Autoimmune work-up to date unrevealing. Pt is a former smoker with FH of SLE in grandmother. No history of occupational exposures. hecked this admission and negative. She is s/p VATS with left upper lobe biopsy. . - Appreciate pulmonology and CVTS recommendations - Oxygen therapy as needed to keep SpO2> 92 % (pt will not require previous PRN home O2) - Blood cultures x 2 (11/18/13) > NG final   - Left lung tissue cultures (11/21/13 NG final), AFB, fungal, tissue ---> NGTD  - Left bronchial washings cultures (  11/21/13 NG final), AFB, fungal, tissue --> NGTD - Biopsy pathology results pending (sent out to Dr Adrienne Mocha for review)  - Continue Duonebs TID  - IV solumedrol 60 mg BID to 30 mg BID with goal prednisone 40 mg   - Obtain hypersensitivity pneumonitis profile > in process   - Wean off fentanyl PCA for pain (on patch as well)  - Pulmonary toliet and ambulate patient TID, will order PT/OT when okay with CVTS (CT removal tomm?) - Repeat CXR 3/10 --> stable trace pneumothorax -PT/SW/CM consults ---> possible SNF placement?  Hypokalemia - K 3.1 this AM. Etiology likely due to GI loss (emesis, diarrhea) vs inadequate nutrition. - Monitor BMP, Mg  - Replete as necessary   History of Grade 1 Diastolic CHF - Currently mild hypervolemia to euvolemic. 2D-echo on 1/28 with EF 65-70% with grade 1 diastolic dysfunction. Pt with elevated Pro-BNP 585.4 but lower than previous admission (3533).  - Place and maintain TED stockings --> pt refused - Monitor daily weights and volume status   Seizure Disorder - Pt with no recent seizure activity.  - Continue keppra 500 mg BID   Fibromyalgia - Pt with diffuse body aches and pain unchanged from baseline.  - Continue home tramadol 50 mg BID PRN pain  - Continue home etodolac 500 mg BID  - Continue home fentanyl 50 mcg patch Q 72 hrs --> weaning off fentantyl PCA - Continue home pregabalin 300 mg BID   Migraine headache - Currently without migraine headache. - Continue home sumatriptan Q 2hr PRN  - Continue pregabalin and Keppra as above  Chronic Normocytic Anemia with folate/B12 deficiency - Hg 8.8 this AM, below baseline of 10. 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 Pt with no recent active bleeding or hemodynamic instability.  -Monitor for bleeding  -Transfuse if Hg<7  -Continue folate 1 mg daily  - ontinue B12 supplements 2000 mcg daily  -SCDs for DVT ppx, hold heparin  -Continue to monitor   Chronic epigastric pain - stable. Etiology unknown, most likely due to gastritis (chronic etodolac use) vs PUD vs GERD. No prior EGD testing.  - IV Zofran Q 6hr for nausea  - Continue protonix 40 mg BID  - Miralax, senokot daily PRN constipation   Vitamin D Deficiency - Pt with recent 25-OH  Vitamin D of 13.  - Continue vitamin D 50,000 units every 7 days  Severe Malnutrition and hypoalbuminemia - Pt with chronic low albumin levels. Etiology most likley due to chronic protein malnutrition vs falsely low in setting of acute infection (acute phase reactant).  - Ensure Complete BID --> pt refused  Symptomatic UTI - Resolved. Pt is s/p antibiotics (IV Rocephin). Initial urine culture with 100K multiple bacterial morphotypes present. Repeat urine culture (clean catch) showed only 75,000 col/ml yeast. - D/c pyridium 100mg  TID   C difficile Infection - Resolved. Pt with + c diff toxin on last hospitalization and completed treatment after discharge (2/19 for 10 days). Pt received metronidazole (PO then IV) on 3/3-3/5 for total 2 days.  - Continue probiotic 250 mg BID   Pleuritic Chest Pain - resolved. Pt with similar complaint at prior hospitalization. Pt with no history of CAD. Pt with chronic tenderness to palpation on exam suggestive of possible costochondritis. D-dimer was elevated (2.23) however recent CTA chest on 2/15 with no PE. Troponins were negative and no ischemic changes on 12-lead EKG.  - Continue home tramadol 50 mg BID PRN pain  - Continue  home etodolac 500 mg BID  - Continue home fentanyl 50 mcg patch Q 72 hrs  - Monitor on telemetry  Diet: Regular  DVT Ppx: SCDs  Code: Full  Dispo: Disposition is deferred at this time, awaiting improvement of current medical problems  The patient does have a current PCP at [facility] and does need an [facility] hospital follow-up appointment after discharge.  The patient does not have transportation limitations that hinder transportation to clinic appointments.  .Services Needed at time of discharge: Y = Yes, Blank = No PT: Yes  OT:   RN:   Equipment: Rolling walker with 5" wheels  Other:     LOS: 7 days   Otis Brace, MD 11/25/2013, 11:39 AM

## 2013-11-25 NOTE — Clinical Social Work Note (Signed)
CS[facility] which [facility] patient for [facility] SNF placement. Admission coordinator Stanton KidneyDebra states that they would be willing to offer patient a bed if patient's medications after discharge are "not extremely expensive." Stanton KidneyDebra would like a list of the medications that patient will likely DC on so she can "cost" them with her pharmacy. CSW contacted Dr. Craige CottaSood to gather this information. Dr. Craige CottaSood is no longer following this patient and states that CSW will need to contact Dr. Molli KnockYacoub. CSW will contact Dr. Molli KnockYacoub tomorrow to see if this information can be provided to facility.   [name] [name], [place]CSWA, [place]ASA, [phone]

## 2013-11-25 NOTE — Progress Notes (Addendum)
       [address]       [place] [id number]             [phone]          4 Days Post-Op Procedure(s) (LRB): VIDEO BRONCHOSCOPY (N/A) VIDEO ASSISTED THORACOSCOPY (Left) LUNG BIOPSY (Left)  Subjective: "I feel so much better!"  Less pain since CT removal.  Walked around in room this am, +BM, breathing stable.   Objective: Vital signs in last 24 hours: Patient Vitals for the past 24 hrs:  BP Temp Temp src Pulse Resp SpO2  11/25/13 0400 - - - - 19 100 %  11/25/13 0323 148/89 mmHg 98.5 F (36.9 C) Oral 85 23 99 %  11/25/13 0000 - - - - 19 98 %  11/24/13 2308 142/80 mmHg 98.9 F (37.2 C) Oral 93 23 99 %  11/24/13 2033 - - - - - 99 %  11/24/13 2000 - - - - 17 98 %  11/24/13 1906 144/81 mmHg 98.4 F (36.9 C) Oral 94 20 99 %  11/24/13 1547 117/72 mmHg 98.1 F (36.7 C) Oral 105 20 98 %  11/24/13 1422 - - - - - 98 %  11/24/13 1300 130/82 mmHg 98.3 F (36.8 C) Oral 101 18 100 %  11/24/13 1148 - - - - 24 97 %  11/24/13 0932 - - - - - 99 %  11/24/13 0926 - - - - - 99 %   Current Weight  11/22/13 122 lb 12.7 oz (55.7 kg)     Intake/Output from previous day: 03/09 0701 - 03/10 0700 In: 1670.1 [P.O.:960; I.V.:710.1] Out: 860 [Urine:700; Chest Tube:160]    PHYSICAL EXAM:  Heart: RRR Lungs: Clear Wound: Clean and dry Chest tube: No air leak    Lab Results: CBC: Recent Labs  11/23/13 0432 11/25/13 0440  WBC 12.1* 13.1*  HGB 9.2* 8.8*  HCT 29.7* 28.4*  PLT 332 316   BMET:  Recent Labs  11/23/13 0432 11/25/13 0440  NA 144 142  K 3.4* 3.1*  CL 109 103  CO2 24 31  GLUCOSE 102* 93  BUN 8 11  CREATININE 0.51 0.51  CALCIUM 7.2* 8.2*    PT/INR: No results found for this basename: LABPROT, INR,  in the last 72 hours    Assessment/Plan: S/P Procedure(s) (LRB): VIDEO BRONCHOSCOPY (N/A) VIDEO ASSISTED THORACOSCOPY (Left) LUNG BIOPSY (Left) CT no air leak, CXR stable. Hopefully can d/c remaining CT soon. Continue ambulation, pulm  toilet. Hypokalemia -replace K+ per protocol. Path remains pending, cx's negative. Continue steroid taper, other pulm management per CCM, IM teaching service.     LOS: 7 days    COLLINS,GINA H 11/25/2013  I have seen and examined [name] [name] and agree with the above assessment  and plan.  Delight OvensEdward B Odin Mariani MD Beeper [phone] Office [phone] 11/25/2013 5:33 PM

## 2013-11-25 NOTE — Progress Notes (Signed)
D/c fentanyl PCA 23 ml's wasted with Seward GraterMaggie, RN

## 2013-11-25 NOTE — Progress Notes (Signed)
  Date: 11/25/2013  Patient name: [name] [name]  Medical record number: [id number]  Date of birth: 09/18/1960   This patient has been seen and the plan of care was discussed with the house staff. Please see their note for complete details. I concur with their findings with the following additions/corrections: Feels better this morning from a respiratory standpoint. Her O2 sat is 100% on RA. Her pain appears controlled. Repeat CXR shows small stable left apical pneumo. One chest tube still present. Slow taper of steroids. Bx results are crucial.  Rest per resident note.  Jonah BlueAlejandro Song Garris, DO, FACP Faculty [facility] 11/25/2013, 10:35 AM

## 2013-11-25 NOTE — Progress Notes (Signed)
Brought pain medicine, prepared to pull Pt CT. Pt in tears, begged me not to pull until am. Supremely anxious about being in pain all night. Paged DR. Tyrone SageGerhardt, ok to pull am. Will continue to monitor.

## 2013-11-25 NOTE — Progress Notes (Signed)
NUTRITION FOLLOW-UP  Patient meets criteria for severe malnutrition in the context of acute illness or injury as evidenced by < 50% of estimated energy requirement for > 5 days and 7% weight loss in < 2 months.  DOCUMENTATION CODES Per approved criteria  -Severe malnutrition in the context of acute illness or injury   INTERVENTION: Add MVI daily. Agree with Regular, liberalized diet. Pt declined oral nutrition supplements at this time; reviewed and encouraged high protein foods. RD to follow for nutrition care plan.  NUTRITION DIAGNOSIS: Predicted suboptimal intake related to poor appetite as evidenced by nutrition assessment history. Improving.  Goal: Pt to meet >/= 90% of their estimated nutrition needs - improving.  Monitor:  PO & supplemental intake, weight, labs, I/O's  ASSESSMENT: 53 year old female whom CCM has seen on several occassions for recurrent steroid responsive pneumonitis of uncertain etiology. Just discharged on 2/19 w/ recommendations for steroid taper w/ suggestion that she would need open lung bx should this re-occur. At time of d/c her CXR was essentially clear. She was re-admitted on 3/3 w/ CC:1 d/ h/o chest pain and progressive dyspnea. CXR w/ reoccurrence of bilateral pulmonary infiltrates.    Underwent the following Procedures on 3/6: VIDEO BRONCHOSCOPY  VIDEO ASSISTED THORACOSCOPY (Left)  LUNG BIOPSY (Left)  Pathology pending, cultures have been negative thus far.  Patient has been on a Regular diet since 3/7 with thin liquids. Most recent meal intake is 75%. Pt confirms that her appetite is improving. She has had 1 chest tube removed and plans to have the other removed soon. She is looking forward to being able to work with PT soon. Pt confirms that she finally had a BM this morning. Eating at least 50% of what comes on her trays, encouraged adequate nutrition to promote healing.  CT output - 10 ml so far today  Potassium is low at 3.1 - ordered for  KCl Magnesium WNL Meds of note: flroastor, protonix, folic acid and B12  Height: Ht Readings from Last 1 Encounters:  11/18/13 5' 1.02" (1.55 m)    Weight: Wt Readings from Last 1 Encounters:  11/22/13 122 lb 12.7 oz (55.7 kg)  Admit wt 124 lb - stable  BMI:  Body mass index is 23.18 kg/(m^2). Normal weight  Estimated Nutritional Needs: Kcal: 1600-1800 Protein: 80-90 gm Fluid: 1.6-1.8 L  Skin: chest incision  Diet Order: General  EDUCATION NEEDS: -No education needs identified at this time   Intake/Output Summary (Last 24 hours) at 11/25/13 1312 Last data filed at 11/25/13 1100  Gross per 24 hour  Intake 1070.12 ml  Output    620 ml  Net 450.12 ml    Last BM: 3/10  Labs:   Recent Labs Lab 11/20/13 0215  11/21/13 0245 11/22/13 0355 11/23/13 0432 11/25/13 0440  NA 143  < > 137 136* 144 142  K 2.9*  < > 3.8 4.3 3.4* 3.1*  CL 100  < > 100 101 109 103  CO2 30  < > 26 26 24 31   BUN 14  < > 15 11 8 11   CREATININE 0.62  < > 0.63 0.59 0.51 0.51  CALCIUM 9.2  < > 8.8 8.4 7.2* 8.2*  MG 1.8  --  1.6 2.2  --   --   GLUCOSE 183*  < > 80 124* 102* 93  < > = values in this interval not displayed.  Scheduled Meds: . bisacodyl  10 mg Oral Daily  . fentaNYL  50 mcg Transdermal  Q72H  . fentaNYL   Intravenous 6 times per day  . folic acid  1 mg Oral Daily  . ipratropium-albuterol  3 mL Nebulization TID  . levETIRAcetam  500 mg Oral BID  . methylPREDNISolone (SOLU-MEDROL) injection  30 mg Intravenous Q12H  . pantoprazole  40 mg Oral BID  . potassium chloride  40 mEq Oral Q4H  . pregabalin  300 mg Oral BID  . saccharomyces boulardii  250 mg Oral BID  . vitamin B-12  2,000 mcg Oral Daily    Continuous Infusions: . dextrose 5 % and 0.45 % NaCl with KCl 20 mEq/L 20 mL/hr at 11/24/13 0600    Jarold MottoSamantha Jorgia Manthei MS, RD, LDN Inpatient Registered Dietitian Pager: [phone] After-hours pager: [phone]

## 2013-11-25 NOTE — Progress Notes (Signed)
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 Just discharged on 2/19 w/ recommendations for steroid taper w/ suggestion that she would need Open lung bx should this re-occur. At time of d/c her CXR was essentially clear. She was re-admitted on 3/3 w/ CC: 1 d/ h/o chest pain and progressive dyspnea. CXR w/ reoccurrence of bilateral pulmonary infiltrates. PCCM was asked to eval.    SIGNIFICANT EVENTS/STUDIES:  1/28 - CTA chest>>>No PE. Bilateral prominently upper lobe mixed airspace and ground-glass opacities, multi lobar pneumonia is favored. Mediastinal and bilateral hilar adenopathy  1/28 - Bronchoscopy >>> 6688 WBC (93% neutrophils), Cx negative 1/28 - TTE >>> EF 65-70%, grade 1 DD, PA peak 38, no change from prior   1/28 - Labs >>> P ANCA 1:20, ANA 1:80, DS DNA 1, RF 13, ESR 98 2/07 - Labs >>> CPK 31, SS-A < 1, SS-B < 1, ACE 14 2/09 - Labs >>> P ANCA 1:20, MPO 1.1 2/15 - Chest CT >>> No pulmonary embolism, persistent upper lobe predominant bilateral airspace and ground-glass opacities, improved relative to most recent CT from 1/28, mediastinal and bilateral adenopathy 2/16 Echo  >>> EF 63%, grade 1 systolic dysfunction  .................................................................................................................................................... 3/03 - Admit with CP & Progressive dyspnea.  CT chest >>> diffuse b/l GGO with upper lobe predominance 3/6  VATS bx >>>  LINES / TUBES: OETT 3/6>>>3/6 Aline 3/6>>> L [place] CVL 3/6>>> L CT x 2 3/6>>>  CULTURES: Blood 3/03 >>>  Urine 3/5> yeast x 75 k Bronchial Washings 3/6 >>> NEG Tissue culture 3/6 >>>NTD  Autoimmune:   AntiSCL70 - neg  Jo-1 - neg  Anti-ds DNA - neg ANA - POS/ homogeneous and Anti DNA Pos only at 1 dilution c-ANCA - neg P-ANCA - POS RF - neg  PCP - neg  C3 complement - wnl C4 complement - wnl VATS path pending  ANTIBIOTICS: Cefepime 3/3 x1 vanc 3/3 >>>3/5 Flagyl 3/3 >>>3/05 Cefuroxime 3/5 > 3/6  SUBJECTIVE:    Complains of pain all over, but specifically at chest tube sites. No respiratory distress at this time.   VITAL SIGNS: Temp:  [98.1 F (36.7 C)-98.9 F (37.2 C)] 98.7 F (37.1 C) (03/10 0736) Pulse Rate:  [85-105] 85 (03/10 0323) Resp:  [17-24] 19 (03/10 0400) BP: (117-148)/(72-89) 148/89 mmHg (03/10 0323) SpO2:  [97 %-100 %] 100 % (03/10 0840) FIO2  2lpm NP  PHYSICAL EXAMINATION: General:  Chronically ill appearing Neuro: normal strength, generalized weakness HEENT: no sinus tenderness Cardiovascular: regular, tachycardic Lungs:  Clear anteriorly Abdomen:  Soft, mild tenderness Musculoskeletal: 1+ edema Skin: no rashes   PULMONARY  Recent Labs Lab 11/20/13 2123 11/21/13 1423 11/22/13 0320  PHART 7.477* 7.413 7.411  PCO2ART 38.1 42.9 43.4  PO2ART 59.7* 178.0* 118.0*  HCO3 27.8* 27.3* 27.0*  TCO2 29.0 28.7 28.3  O2SAT 90.9 100.0 99.1    CBC  Recent Labs Lab 11/22/13 0355 11/23/13 0432 11/25/13 0440  HGB 9.5* 9.2* 8.8*  HCT 29.9* 29.7* 28.4*  WBC 9.4 12.1* 13.1*  PLT 339 332 316    COAGULATION  Recent Labs Lab 11/18/13 1657 11/20/13 2204  INR 1.43 1.26    CARDIAC    Recent Labs Lab 11/18/13 1659 11/18/13 2300  TROPONINI <0.30 <0.30   No results found for this basename: PROBNP,  in the last 168 hours   CHEMISTRY  Recent Labs Lab 11/20/13 0215  11/20/13 2204 11/21/13 0245 11/22/13 0355 11/23/13 0432 11/25/13 0440  NA 143  < > 138 137 136* 144 142  K 2.9*  < > 3.8 3.8 4.3 3.4* 3.1*  CL 100  < > 100 100 101 109 103  CO2 30  < > '26 26 26 24 31  ' GLUCOSE 183*  < > 103* 80 124* 102* 93  BUN 14  < > '16 15 11 8 11   ' CREATININE 0.62  < > 0.61 0.63 0.59 0.51 0.51  CALCIUM 9.2  < > 9.3 8.8 8.4 7.2* 8.2*  MG 1.8  --   --  1.6 2.2  --   --   < > = values in this interval not displayed. Estimated Creatinine Clearance: 62.9 ml/min (by C-G formula based on Cr of 0.51).   LIVER  Recent Labs Lab 11/18/13 1657 11/20/13 2204 11/23/13 0432  AST  --  8 9  ALT  --  5 5  ALKPHOS  --  81 51  BILITOT  --  0.2* <0.2*  PROT  --  6.9 4.8*  ALBUMIN  --  2.5* 1.8*  INR 1.43 1.26  --      INFECTIOUS  Recent Labs Lab 11/20/13 1145  PROCALCITON 0.20     ENDOCRINE CBG (last 3)  No results found for this basename: GLUCAP,  in the last 72 hours   IMAGING x48h  Dg Chest Port 1 View  11/25/2013   CLINICAL DATA:  Chest tube  EXAM: PORTABLE CHEST - 1 VIEW  COMPARISON:  Prior chest x-ray 11/24/2013  FINDINGS: Stable position of left subclavian approach central venous catheter. The catheter tip projects over the upper SVC min is directed toward the caval wall. Interval removal of the more superior left chest tube. The more inferior left chest tube has been retracted slightly. No interval change in small left apical pneumothorax. Surgical changes of left upper and lower lobe wedge resections with residual chain sutures. The right lung remains clear. Cardiac and mediastinal contours are within normal limits. No acute osseous abnormality.  IMPRESSION: 1. Stable trace left apical pneumothorax following removal of the more superior of the 2 left chest tubes. The more inferior chest tube has pulled back slightly but remains intra thoracic. 2. Unchanged position of left subclavian central venous catheter. 3. No significant interval change in the appearance of the left lung with expected postsurgical changes.   Electronically Signed   By: Jacqulynn Cadet M.D.   On: 11/25/2013 08:20   Dg Chest Port 1 View  11/24/2013   CLINICAL DATA:  Post VATS  EXAM: PORTABLE CHEST - 1 VIEW  COMPARISON:  Portable exam 4239 hr compared to  11/23/2013  FINDINGS: Left subclavian central venous catheter tip projects over SVC.  Left thoracostomy tubes unchanged.  Persistent small left pneumothorax.  Staple lines at left apex and left base post resections.  Remaining lungs clear.  No pleural effusion or acute osseous findings.  IMPRESSION: Persistent small postoperative left pneumothorax despite presence of thoracostomy tubes.   Electronically Signed   By: Lavonia Dana M.D.   On: 11/24/2013 08:14      ASSESSMENT / PLAN:  Recurrent pneumonitis of unclear etiology - partially responsive to steroids.  ?ILD? SLE  S/p VATS with LUL bx. 3/6 Acute Respiratory Failure/ 02 dep post op Pleuritic Chest  Pain   P: - Tapered solumedrol. Decrease to 60 q 12h today.  Will decrease to 30 q12 today.  Goal is to get to 40 mg of PO prednisone over the next 24-48 hours. - Follow VATS cultures / biopsy results, send out so may take sometime. - On RA currently, will not require home O2.  - Continue bronchodilators as ordered. - Fentanyl PCA for pain, will defer that to primary. - Push pulmonary hygiene. - Biopsy result pending-- will take 1 week at least due to send out to Dr Dian Situ. - Chest tube to be removed today per CVTS recommendation. - Would consider discharge with outpatient f/u if patient is tolerating PO prednisone alone and would not taper until seen as outpatient.  Will continue to follow with you.  Rush Farmer, M.D. [facility] Pulmonary/Critical Care Medicine. Pager: [phone]. After hours pager: [phone].

## 2013-11-26 ENCOUNTER — Inpatient Hospital Stay (HOSPITAL_COMMUNITY): Payer: Medicaid Other

## 2013-11-26 DIAGNOSIS — R0902 Hypoxemia: Secondary | ICD-10-CM

## 2013-11-26 LAB — BASIC METABOLIC PANEL
BUN: 11 mg/dL (ref 6–23)
CHLORIDE: 100 meq/L (ref 96–112)
CO2: 30 mEq/L (ref 19–32)
Calcium: 8.6 mg/dL (ref 8.4–10.5)
Creatinine, Ser: 0.52 mg/dL (ref 0.50–1.10)
GFR calc Af Amer: >90 mL/min (ref >90)
GFR calc non Af Amer: >90 mL/min (ref >90)
GLUCOSE: 71 mg/dL (ref 70–99)
POTASSIUM: 4.6 meq/L (ref 3.7–5.3)
SODIUM: 139 meq/L (ref 137–147)

## 2013-11-26 LAB — CBC
HEMATOCRIT: 31.9 % — AB (ref 36.0–46.0)
HEMOGLOBIN: 9.7 g/dL — AB (ref 12.0–15.0)
MCH: 26.9 pg (ref 26.0–34.0)
MCHC: 30.4 g/dL (ref 30.0–36.0)
MCV: 88.4 fL (ref 78.0–100.0)
Platelets: 348 10*3/uL (ref 150–400)
RBC: 3.61 MIL/uL — AB (ref 3.87–5.11)
RDW: 18.5 % — ABNORMAL HIGH (ref 11.5–15.5)
WBC: 14 10*3/uL — AB (ref 4.0–10.5)

## 2013-11-26 LAB — MAGNESIUM: Magnesium: 2.4 mg/dL (ref 1.5–2.5)

## 2013-11-26 MED ORDER — PREDNISONE 20 MG PO TABS
40.0000 mg | ORAL_TABLET | Freq: Every day | ORAL | Status: DC
  Administered 2013-11-27 – 2013-11-28 (×2): 40 mg via ORAL
  Filled 2013-11-26 (×3): qty 2

## 2013-11-26 MED ORDER — SODIUM CHLORIDE 0.9 % IJ SOLN: 10.0000 mL | Freq: Two times a day (BID) | INTRAMUSCULAR | Status: DC

## 2013-11-26 MED ORDER — SODIUM CHLORIDE 0.9 % IJ SOLN: 10.0000 mL | INTRAMUSCULAR | Status: DC | PRN

## 2013-11-26 MED ORDER — MORPHINE SULFATE 2 MG/ML IJ SOLN
2.0000 mg | INTRAMUSCULAR | Status: DC | PRN
  Administered 2013-11-26 – 2013-11-28 (×7): 2 mg via INTRAVENOUS
  Filled 2013-11-26 (×8): qty 1

## 2013-11-26 NOTE — Clinical Social Work Note (Signed)
Fl2 on patient's chart for MD signature. CSW also [facility] Patient's family plans to go and look at the facility.   Roddie McBryant Symphani Eckstrom, [place], [place], [phone]

## 2013-11-26 NOTE — Clinical Social Work Note (Signed)
CSW contacted financial counselor to ask for assistance with helping patient apply for Medicaid (per patient's request). Financial counselor states that she will follow up with patient.  Roddie McBryant Shanetra Blumenstock, [place], [place], [phone]

## 2013-11-26 NOTE — Progress Notes (Signed)
Paged IV team to check order placed under Dr. Dion BodyPaya's name for the PICC line; Stacie Glazeobin Joyce, RN. Awaiting return response. Spoke with IV team RN, will pass message to R. Alona BeneJoyce, RN. Order DC.

## 2013-11-26 NOTE — Progress Notes (Signed)
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 Just discharged on 2/19 w/ recommendations for steroid taper w/ suggestion that she would need Open lung bx should this re-occur. At time of d/c her CXR was essentially clear. She was re-admitted on 3/3 w/ CC: 1 d/ h/o chest pain and progressive dyspnea. CXR w/ reoccurrence of bilateral pulmonary infiltrates. PCCM was asked to eval.    SIGNIFICANT EVENTS/STUDIES:  1/28 - CTA chest>>>No PE. Bilateral prominently upper lobe mixed airspace and ground-glass opacities, multi lobar pneumonia is favored. Mediastinal and bilateral hilar adenopathy  1/28 - Bronchoscopy >>> 6688 WBC (93% neutrophils), Cx negative 1/28 - TTE >>> EF 65-70%, grade 1 DD, PA peak 38, no change from prior   1/28 - Labs >>> P ANCA 1:20, ANA 1:80, DS DNA 1, RF 13, ESR 98 2/07 - Labs >>> CPK 31, SS-A < 1, SS-B < 1, ACE 14 2/09 - Labs >>> P ANCA 1:20, MPO 1.1 2/15 - Chest CT >>> No pulmonary embolism, persistent upper lobe predominant bilateral airspace and ground-glass opacities, improved relative to most recent CT from 1/28, mediastinal and bilateral adenopathy 2/16 Echo  >>> EF 29%, grade 1 systolic dysfunction  .................................................................................................................................................... 3/03 - Admit with CP & Progressive dyspnea.  CT chest >>> diffuse b/l GGO with upper lobe predominance 3/6  VATS bx >>>  LINES / TUBES: OETT 3/6>>>3/6 Aline 3/6>>> L [place] CVL 3/6>>> L CT x 2 3/6>>>  CULTURES: Blood 3/03 >>>  Urine 3/5> yeast x 75 k Bronchial Washings 3/6 >>> NEG Tissue culture 3/6 >>>NTD  Autoimmune:   AntiSCL70 - neg  Jo-1 - neg  Anti-ds DNA - neg ANA - POS/ homogeneous and Anti DNA Pos only at 1 dilution c-ANCA - neg P-ANCA - POS RF - neg  PCP - neg  C3 complement - wnl C4 complement - wnl VATS path pending  ANTIBIOTICS: Cefepime 3/3 x1 vanc 3/3 >>>3/5 Flagyl 3/3 >>>3/05 Cefuroxime 3/5 > 3/6  SUBJECTIVE:    Complains of pain all over, but specifically at chest tube sites. No respiratory distress at this time.   VITAL SIGNS: Temp:  [97.8 F (36.6 C)-99.2 F (37.3 C)] 97.8 F (36.6 C) (03/11 0807) Pulse Rate:  [85-102] 86 (03/11 0723) Resp:  [16-20] 16 (03/11 0723) BP: (123-142)/(72-106) 126/88 mmHg (03/11 0723) SpO2:  [98 %-100 %] 100 % (03/11 0723) FIO2  2lpm NP  PHYSICAL EXAMINATION: General:  Chronically ill appearing Neuro: normal strength, generalized weakness HEENT: no sinus tenderness Cardiovascular: regular, tachycardic Lungs:  Clear anteriorly Abdomen:  Soft, mild tenderness Musculoskeletal: 1+ edema Skin: no rashes   PULMONARY  Recent Labs Lab 11/20/13 2123 11/21/13 1423 11/22/13 0320  PHART 7.477* 7.413 7.411  PCO2ART 38.1 42.9 43.4  PO2ART 59.7* 178.0* 118.0*  HCO3 27.8* 27.3* 27.0*  TCO2 29.0 28.7 28.3  O2SAT 90.9 100.0 99.1    CBC  Recent Labs Lab 11/23/13 0432 11/25/13 0440 11/26/13 0613  HGB 9.2* 8.8* 9.7*  HCT 29.7* 28.4* 31.9*  WBC 12.1* 13.1* 14.0*  PLT 332 316 348    COAGULATION  Recent Labs Lab 11/20/13 2204  INR 1.26    CARDIAC   No results found for this basename: TROPONINI,  in the last  168 hours No results found for this basename: PROBNP,  in the last 168 hours   CHEMISTRY  Recent Labs Lab 11/20/13 0215  11/21/13 0245 11/22/13 0355 11/23/13 0432 11/25/13 0440 11/25/13 1558 11/26/13 0613  NA 143  < > 137 136* 144 142 141 139  K 2.9*  < > 3.8 4.3 3.4* 3.1* 3.9 4.6  CL 100  < > 100 101 109 103 104 100  CO2 30  < > '26 26 24 31 28 30  ' GLUCOSE 183*  < > 80 124* 102* 93 133* 71  BUN 14  < > '15  11 8 11 9 11  ' CREATININE 0.62  < > 0.63 0.59 0.51 0.51 0.51 0.52  CALCIUM 9.2  < > 8.8 8.4 7.2* 8.2* 8.5 8.6  MG 1.8  --  1.6 2.2  --   --  1.5 2.4  < > = values in this interval not displayed. Estimated Creatinine Clearance: 62.9 ml/min (by C-G formula based on Cr of 0.52).   LIVER  Recent Labs Lab 11/20/13 2204 11/23/13 0432  AST 8 9  ALT 5 5  ALKPHOS 81 51  BILITOT 0.2* <0.2*  PROT 6.9 4.8*  ALBUMIN 2.5* 1.8*  INR 1.26  --      INFECTIOUS  Recent Labs Lab 11/20/13 1145  PROCALCITON 0.20     ENDOCRINE CBG (last 3)  No results found for this basename: GLUCAP,  in the last 72 hours   IMAGING x48h  Dg Chest 2 View  11/26/2013   CLINICAL DATA Post VATS  EXAM CHEST  2 VIEW  COMPARISON DG CHEST 1V PORT dated 11/25/2013; DG CHEST 1V PORT dated 11/23/2013; DG CHEST 1V PORT dated 11/24/2013  FINDINGS Grossly unchanged cardiac silhouette and mediastinal contours. Stable position of support apparatus with unchanged small left apical pneumothorax. Stable postoperative change of the left upper and lower lungs. Minimally improved aeration of the lungs with persistent left basilar opacities favored to represent atelectasis and/or contusion. No new focal airspace opacities. No evidence of edema. No definite pleural effusion. Unchanged bones.  IMPRESSION 1. Stable positioning of support apparatus. Unchanged small left apical pneumothorax. 2. Improved aeration the lungs with persistent left basilar atelectasis/contusion.  SIGNATURE  Electronically Signed   By: Sandi Mariscal M.D.   On: 11/26/2013 07:48   Dg Chest Port 1 View  11/25/2013   CLINICAL DATA:  Chest tube  EXAM: PORTABLE CHEST - 1 VIEW  COMPARISON:  Prior chest x-ray 11/24/2013  FINDINGS: Stable position of left subclavian approach central venous catheter. The catheter tip projects over the upper SVC min is directed toward the caval wall. Interval removal of the more superior left chest tube. The more inferior left chest tube has been  retracted slightly. No interval change in small left apical pneumothorax. Surgical changes of left upper and lower lobe wedge resections with residual chain sutures. The right lung remains clear. Cardiac and mediastinal contours are within normal limits. No acute osseous abnormality.  IMPRESSION: 1. Stable trace left apical pneumothorax following removal of the more superior of the 2 left chest tubes. The more inferior chest tube has pulled back slightly but remains intra thoracic. 2. Unchanged position of left subclavian central venous catheter. 3. No significant interval change in the appearance of the left lung with expected postsurgical changes.   Electronically Signed   By: Jacqulynn Cadet M.D.   On: 11/25/2013 08:20      ASSESSMENT / PLAN:  Recurrent pneumonitis of unclear etiology - partially  responsive to steroids.  ?ILD? SLE  S/p VATS with LUL bx. 3/6 Acute Respiratory Failure/ 02 dep post op Pleuritic Chest Pain   P: - Tapered solumedrol. Decreased to 30 q 12h today.  Well tolerated, will change to prednisone 40 mg PO daily and maintain for now. - Follow VATS cultures / biopsy results, send out so may take sometime. - On RA currently, will not require home O2.  - Continue bronchodilators as ordered. - PRN Fentanyl for pain. - Push pulmonary hygiene. - Biopsy result pending-- will take 1 week at least due to send out to Dr Dian Situ. - Chest tubes per CVTS, likely d/c today. - Would consider discharge with outpatient f/u but patient once CT is out not willing to go until diagnosis is established.  Will continue to follow with you.  Rush Farmer, M.D. [facility] Pulmonary/Critical Care Medicine. Pager: [phone]. After hours pager: [phone].

## 2013-11-26 NOTE — Progress Notes (Signed)
  Date: 11/26/2013  Patient name: [name] [name]  Medical record number: [id number]  Date of birth: 09/23/1960   This patient has been seen and the plan of care was discussed with the house staff. Please see their note for complete details. I concur with their findings with the following additions/corrections: She feels well today. She is on room air, doing very well. Awaiting removal of chest tube. She has no significant air leak noted today (one bubble after a few coughs seen). I agree with prednisone conversion as outpatient and maintain on at least 40 mg daily until final bx results know. Suspect ready for D/C tomorrow.  Jonah BlueAlejandro Meghan Warshawsky, DO, FACP Faculty [facility] 11/26/2013, 2:10 PM

## 2013-11-26 NOTE — Progress Notes (Signed)
Physician notified: Rabbani At: 1658  Regarding: Pt to get PICC? Planned DC to SNF soon?  Awaiting return response.   Returned Response at: 1510  Order(s): Order incorrect. Pt does NOT need PICC. All meds oral.

## 2013-11-26 NOTE — Progress Notes (Addendum)
       [address]       [place] [id number]             [phone]          5 Days Post-Op Procedure(s) (LRB): VIDEO BRONCHOSCOPY (N/A) VIDEO ASSISTED THORACOSCOPY (Left) LUNG BIOPSY (Left)  Subjective: Pt refused CT removal yesterday as she was concerned she would have pain and wouldn't be able to sleep. Not ambulating much. Breathing stable.    Objective: Vital signs in last 24 hours: Patient Vitals for the past 24 hrs:  BP Temp Temp src Pulse Resp SpO2  11/26/13 0330 142/91 mmHg 98.5 F (36.9 C) Oral 85 19 100 %  11/25/13 2315 132/72 mmHg 99.1 F (37.3 C) Oral 92 - 100 %  11/25/13 2040 - - - - - 99 %  11/25/13 1935 135/82 mmHg 99 F (37.2 C) Oral 102 18 98 %  11/25/13 1512 - 99.2 F (37.3 C) Oral - - 100 %  11/25/13 1500 123/106 mmHg - - 90 20 100 %  11/25/13 0840 - - - - - 100 %   Current Weight  11/22/13 122 lb 12.7 oz (55.7 kg)     Intake/Output from previous day: 03/10 0701 - 03/11 0700 In: 1192.1 [P.O.:480; I.V.:562.1; IV Piggyback:150] Out: 980 [Urine:900; Chest Tube:80]    PHYSICAL EXAM:  Heart: RRR Lungs: Diminished BS on L Wound: Clean and dry Chest tube: No air leak    Lab Results: CBC: Recent Labs  11/25/13 0440 11/26/13 0613  WBC 13.1* 14.0*  HGB 8.8* 9.7*  HCT 28.4* 31.9*  PLT 316 348   BMET:  Recent Labs  11/25/13 1558 11/26/13 0613  NA 141 139  K 3.9 4.6  CL 104 100  CO2 28 30  GLUCOSE 133* 71  BUN 9 11  CREATININE 0.51 0.52  CALCIUM 8.5 8.6    PT/INR: No results found for this basename: LABPROT, INR,  in the last 72 hours  CXR: FINDINGS  Grossly unchanged cardiac silhouette and mediastinal contours.  Stable position of support apparatus with unchanged small left  apical pneumothorax. Stable postoperative change of the left upper  and lower lungs. Minimally improved aeration of the lungs with  persistent left basilar opacities favored to represent atelectasis  and/or contusion. No new focal  airspace opacities. No evidence of  edema. No definite pleural effusion. Unchanged bones.  IMPRESSION  1. Stable positioning of support apparatus. Unchanged small left  apical pneumothorax.  2. Improved aeration the lungs with persistent left basilar  atelectasis/contusion   Assessment/Plan: S/P Procedure(s) (LRB): VIDEO BRONCHOSCOPY (N/A) VIDEO ASSISTED THORACOSCOPY (Left) LUNG BIOPSY (Left) D/c remaining CT this am. Mobilize as tolerated. Medical care per Pulm/CCM, IM service.   LOS: 8 days    COLLINS,GINA H 11/26/2013  Path still pending CT out Call for further assistance, will need Pulmonary follow to review path and decide about ongoing RX I have seen and examined [name] [name] and agree with the above assessment  and plan.  Delight OvensEdward B Wolf Boulay MD Beeper [phone] Office [phone] 11/26/2013 2:50 PM

## 2013-11-26 NOTE — Clinical Social Work Note (Signed)
[facility] [place] states that they will be able to offer a bed for patient with LOG when patient is ready for DC. CSW will continue to update facility on patient's condition and possible DC date.   Roddie McBryant Jemari Hallum, [place], [place], [phone]

## 2013-11-26 NOTE — Progress Notes (Signed)
Subjective:  Pt seen and examined in AM.  Pt reports improved pain at chest tube site. She is  breathing well with no cough, fever, or chills. LE edema is at baseline. She is having normal BM and urination with no abdominal pain, nausea, or vomiting. She is ambulating to chair without difficulty.    Objective: Vital signs in last 24 hours: Filed Vitals:   11/25/13 2315 11/26/13 0330 11/26/13 0723 11/26/13 0807  BP: 132/72 142/91 126/88   Pulse: 92 85 86   Temp: 99.1 F (37.3 C) 98.5 F (36.9 C)  97.8 F (36.6 C)  TempSrc: Oral Oral  Oral  Resp:  19 16   Height:      Weight:      SpO2: 100% 100% 100%    Weight change:   Intake/Output Summary (Last 24 hours) at 11/26/13 1136 Last data filed at 11/26/13 0700  Gross per 24 hour  Intake 1192.13 ml  Output    970 ml  Net 222.13 ml    Physical Exam: Vitals reviewed.  General:  NAD Cardiac: normal rate and rhythm   Pulm: clear to ausculation, posterior chest tube in place with no air leak Abd: soft, nontender, nondistended, BS present  Ext: +1 nonpitting b/l edema   Neuro: alert and oriented X3, moves 4 extremities voluntarily, generalized weakness  Lab Results: Basic Metabolic Panel:  Recent Labs Lab 11/25/13 1558 11/26/13 0613  NA 141 139  K 3.9 4.6  CL 104 100  CO2 28 30  GLUCOSE 133* 71  BUN 9 11  CREATININE 0.51 0.52  CALCIUM 8.5 8.6  MG 1.5 2.4   Liver Function Tests:  Recent Labs Lab 11/20/13 2204 11/23/13 0432  AST 8 9  ALT 5 5  ALKPHOS 81 51  BILITOT 0.2* <0.2*  PROT 6.9 4.8*  ALBUMIN 2.5* 1.8*   CBC:  Recent Labs Lab 11/25/13 0440 11/26/13 0613  WBC 13.1* 14.0*  HGB 8.8* 9.7*  HCT 28.4* 31.9*  MCV 88.2 88.4  PLT 316 348   Cardiac Enzymes: No results found for this basename: CKTOTAL, CKMB, CKMBINDEX, TROPONINI,  in the last 168 hours Coagulation:  Recent Labs Lab 11/20/13 2204  LABPROT 15.5*  INR 1.26    Micro Results: Recent Results (from the past 240 hour(s))    CULTURE, BLOOD (ROUTINE X 2)     Status: None   Collection Time    11/18/13  9:45 AM      Result Value Ref Range Status   Specimen Description BLOOD LEFT ANTECUBITAL   Final   Special Requests BOTTLES DRAWN AEROBIC AND ANAEROBIC 10CC   Final   Culture  Setup Time     Final   Value: 11/18/2013 14:27     Performed at [facility]   Culture     Final   Value: NO GROWTH 5 DAYS     Performed at [facility]   Report Status 11/24/2013 FINAL   Final  CULTURE, BLOOD (ROUTINE X 2)     Status: None   Collection Time    11/18/13 11:32 AM      Result Value Ref Range Status   Specimen Description BLOOD HAND RIGHT   Final   Special Requests BOTTLES DRAWN AEROBIC AND ANAEROBIC 5CC   Final   Culture  Setup Time     Final   Value: 11/18/2013 16:09     Performed at [facility]   Culture     Final  Value: NO GROWTH 5 DAYS     Performed at [facility]   Report Status 11/24/2013 FINAL   Final  URINE CULTURE     Status: None   Collection Time    11/18/13 12:19 PM      Result Value Ref Range Status   Specimen Description URINE, CLEAN CATCH   Final   Special Requests NONE   Final   Culture  Setup Time     Final   Value: 11/18/2013 12:37     Performed at [facility] Count     Final   Value: >=100,000 COLONIES/ML     Performed at [facility]   Culture     Final   Value: Multiple bacterial morphotypes present, none predominant. Suggest appropriate recollection if clinically indicated.     Performed at [facility]   Report Status 11/20/2013 FINAL   Final  URINE CULTURE     Status: None   Collection Time    11/20/13  7:51 PM      Result Value Ref Range Status   Specimen Description URINE, RANDOM   Final   Special Requests NONE   Final   Culture  Setup Time     Final   Value: 11/21/2013 01:18     Performed at [facility] Count     Final   Value: 75,000 COLONIES/ML     Performed at [facility]    Culture     Final   Value: YEAST     Performed at [facility]   Report Status 11/22/2013 FINAL   Final  AFB CULTURE WITH SMEAR     Status: None   Collection Time    11/21/13 10:50 AM      Result Value Ref Range Status   Specimen Description BRONCHIAL WASHINGS LEFT UPPER   Final   Special Requests NONE   Final   ACID FAST SMEAR     Final   Value: NO ACID FAST BACILLI SEEN     Performed at [facility]   Culture     Final   Value: CULTURE WILL BE EXAMINED FOR 6 WEEKS BEFORE ISSUING A FINAL REPORT     Performed at [facility]   Report Status PENDING   Incomplete  CULTURE, RESPIRATORY (NON-EXPECTORATED)     Status: None   Collection Time    11/21/13 10:50 AM      Result Value Ref Range Status   Specimen Description BRONCHIAL WASHINGS LEFT UPPER   Final   Special Requests NONE   Final   Gram Stain     Final   Value: MODERATE WBC PRESENT, PREDOMINANTLY PMN     NO SQUAMOUS EPITHELIAL CELLS SEEN     NO ORGANISMS SEEN     Performed at [facility]   Culture     Final   Value: NO GROWTH 2 DAYS     Performed at [facility]   Report Status 11/24/2013 FINAL   Final  FUNGUS CULTURE W SMEAR     Status: None   Collection Time    11/21/13 10:50 AM      Result Value Ref Range Status   Specimen Description BRONCHIAL WASHINGS LEFT UPPER   Final   Special Requests NONE   Final   Fungal Smear     Final   Value: NO YEAST OR FUNGAL ELEMENTS SEEN     Performed at  [facility]     Final   Value: CULTURE IN PROGRESS FOR FOUR WEEKS     Performed at [facility]   Report Status PENDING   Incomplete  AFB CULTURE WITH SMEAR     Status: None   Collection Time    11/21/13 12:06 PM      Result Value Ref Range Status   Specimen Description TISSUE LUNG LEFT   Final   Special Requests LEFT UPPER LOBE   Final   ACID FAST SMEAR     Final   Value: NO ACID FAST BACILLI SEEN     Performed at [facility]   Culture     Final    Value: CULTURE WILL BE EXAMINED FOR 6 WEEKS BEFORE ISSUING A FINAL REPORT     Performed at [facility]   Report Status PENDING   Incomplete  FUNGUS CULTURE W SMEAR     Status: None   Collection Time    11/21/13 12:06 PM      Result Value Ref Range Status   Specimen Description TISSUE LUNG LEFT   Final   Special Requests LEFT UPPER LOBE   Final   Fungal Smear     Final   Value: NO YEAST OR FUNGAL ELEMENTS SEEN     Performed at [facility]   Culture     Final   Value: CULTURE IN PROGRESS FOR FOUR WEEKS     Performed at [facility]   Report Status PENDING   Incomplete  TISSUE CULTURE     Status: None   Collection Time    11/21/13 12:06 PM      Result Value Ref Range Status   Specimen Description TISSUE LUNG LEFT   Final   Special Requests LEFT UPPER LOBE   Final   Gram Stain     Final   Value: ABUNDANT WBC PRESENT,BOTH PMN AND MONONUCLEAR     RARE SQUAMOUS EPITHELIAL CELLS PRESENT     NO ORGANISMS SEEN     Performed at [facility]   Culture     Final   Value: NO GROWTH 3 DAYS     Performed at [facility]   Report Status 11/25/2013 FINAL   Final  MRSA PCR SCREENING     Status: None   Collection Time    11/21/13  3:20 PM      Result Value Ref Range Status   MRSA by PCR NEGATIVE  NEGATIVE Final   Comment:            The GeneXpert MRSA Assay (FDA     approved for NASAL specimens     only), is one component of a     comprehensive MRSA colonization     surveillance program. It is not     intended to diagnose MRSA     infection nor to guide or     monitor treatment for     MRSA infections.   Studies/Results: Dg Chest 2 View  11/26/2013   CLINICAL DATA Post VATS  EXAM CHEST  2 VIEW  COMPARISON DG CHEST 1V PORT dated 11/25/2013; DG CHEST 1V PORT dated 11/23/2013; DG CHEST 1V PORT dated 11/24/2013  FINDINGS Grossly unchanged cardiac silhouette and mediastinal contours. Stable position of support apparatus with unchanged small left apical  pneumothorax. Stable postoperative change of the left upper and lower lungs. Minimally improved aeration of the lungs with persistent left basilar opacities favored  to represent atelectasis and/or contusion. No new focal airspace opacities. No evidence of edema. No definite pleural effusion. Unchanged bones.  IMPRESSION 1. Stable positioning of support apparatus. Unchanged small left apical pneumothorax. 2. Improved aeration the lungs with persistent left basilar atelectasis/contusion.  SIGNATURE  Electronically Signed   By: Simonne ComeJohn  Watts M.D.   On: 11/26/2013 07:48   Dg Chest Port 1 View  11/25/2013   CLINICAL DATA:  Chest tube  EXAM: PORTABLE CHEST - 1 VIEW  COMPARISON:  Prior chest x-ray 11/24/2013  FINDINGS: Stable position of left subclavian approach central venous catheter. The catheter tip projects over the upper SVC min is directed toward the caval wall. Interval removal of the more superior left chest tube. The more inferior left chest tube has been retracted slightly. No interval change in small left apical pneumothorax. Surgical changes of left upper and lower lobe wedge resections with residual chain sutures. The right lung remains clear. Cardiac and mediastinal contours are within normal limits. No acute osseous abnormality.  IMPRESSION: 1. Stable trace left apical pneumothorax following removal of the more superior of the 2 left chest tubes. The more inferior chest tube has pulled back slightly but remains intra thoracic. 2. Unchanged position of left subclavian central venous catheter. 3. No significant interval change in the appearance of the left lung with expected postsurgical changes.   Electronically Signed   By: Malachy MoanHeath  McCullough M.D.   On: 11/25/2013 08:20   Medications: I have reviewed the patient's current medications. Scheduled Meds: . bisacodyl  10 mg Oral Daily  . fentaNYL  50 mcg Transdermal Q72H  . folic acid  1 mg Oral Daily  . ipratropium-albuterol  3 mL Nebulization TID  .  levETIRAcetam  500 mg Oral BID  . methylPREDNISolone (SOLU-MEDROL) injection  30 mg Intravenous Q12H  . multivitamin with minerals  1 tablet Oral Daily  . pantoprazole  40 mg Oral BID  . pregabalin  300 mg Oral BID  . saccharomyces boulardii  250 mg Oral BID  . vitamin B-12  2,000 mcg Oral Daily   Continuous Infusions:   PRN Meds:.diphenhydrAMINE, diphenhydrAMINE, morphine injection, naloxone, ondansetron (ZOFRAN) IV, oxyCODONE-acetaminophen, polyethylene glycol, potassium chloride, senna-docusate, sodium chloride, SUMAtriptan, traMADol  Assessment/Plan: 53 year old [place] female with past medical history of fibromyalgia, seizure disorder, and migraines with recent multiple admissions for respiratory distress who presents with acute onset chest pain and found to have sepsis with new infiltrates on chest xray.   Recurrent Pneumonitis of unknown etiology, responsive to corticosteroid therapy, s/p uncomplicated VATS with left UL biopsy on 3/6 POD #5 -  Currently normal SpO2 on RA. Etiology unknown, possibly due to autoimmune disease (sarcoid) vs ILD vs COP. Autoimmune work-up to date unrevealing. Pt is a former smoker with FH of SLE in grandmother. No history of occupational exposures. hecked this admission and negative. She is s/p VATS with left upper lobe biopsy. . - Appreciate pulmonology and CVTS recommendations - Oxygen therapy as needed to keep SpO2> 92 % (pt will not require previous PRN home O2) - Blood cultures x 2 (11/18/13) > NG final   - Left lung tissue cultures (11/21/13 NG final), AFB, fungal, tissue ---> NGTD  - Left bronchial washings cultures (11/21/13 NG final), AFB, fungal, tissue --> NGTD - Biopsy pathology results pending (sent out to Dr Adrienne MochaKatzenstein for review)  - Continue Duonebs TID  - IV solumedrol 30 BID to PO prednisone 40 mg daily   - Obtain hypersensitivity pneumonitis profile > in process  -  IV morphine 2 mg  Q 4 hr PRN pain  - Pulmonary toliet and ambulate  patient TID, PT tomm (CT removal today) - Repeat CXR 3/11 --> stable trace pneumothorax - PT/SW/CM consults ---> possible SNF placement  Hypokalemia -resolved. Etiology likely due to GI loss (emesis, diarrhea) vs inadequate nutrition. - Monitor BMP, Mg  - Replete as necessary   History of Grade 1 Diastolic CHF - Currently mild hypervolemia to euvolemic. 2D-echo on 1/28 with EF 65-70% with grade 1 diastolic dysfunction. Pt with elevated Pro-BNP 585.4 but lower than previous admission (3533).  - Place and maintain TED stockings --> pt refused - Monitor daily weights and volume status   Seizure Disorder - Pt with no recent seizure activity.  - Continue keppra 500 mg BID   Fibromyalgia - Pt with diffuse body aches and pain unchanged from baseline.  - Continue home tramadol 50 mg BID PRN pain  - Continue home etodolac 500 mg BID  - Continue home fentanyl 50 mcg patch Q 72 hrs - Continue home pregabalin 300 mg BID   Migraine headache - Currently without migraine headache. - Continue home sumatriptan Q 2hr PRN  - Continue pregabalin and Keppra as above  Chronic Normocytic Anemia with folate/B12 deficiency - Hg near baseline of 10. 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 Pt with no recent active bleeding or hemodynamic instability.  -Monitor for bleeding  -Transfuse if Hg<7  -Continue folate 1 mg daily  - ontinue B12 supplements 2000 mcg daily  -SCDs for DVT ppx, hold heparin  -Continue to monitor   Chronic epigastric pain - stable. Etiology unknown, most likely due to gastritis (chronic etodolac use) vs PUD vs GERD. No prior EGD testing.  - IV Zofran Q 6hr for nausea  - Continue protonix 40 mg BID  - Miralax, senokot daily PRN constipation   Vitamin D Deficiency - Pt with recent 25-OH Vitamin D of 13.  - Continue vitamin D 50,000 units every 7 days  Severe Malnutrition and hypoalbuminemia - Pt with chronic low albumin levels. Etiology most likley due  to chronic protein malnutrition vs falsely low in setting of acute infection (acute phase reactant).  - Ensure Complete BID --> pt refused  Symptomatic UTI - Resolved. Pt is s/p antibiotics (IV Rocephin). Initial urine culture with 100K multiple bacterial morphotypes present. Repeat urine culture (clean catch) showed only 75,000 col/ml yeast.  C difficile Infection - Resolved. Pt with + c diff toxin on last hospitalization and completed treatment after discharge (2/19 for 10 days). Pt received metronidazole (PO then IV) on 3/3-3/5 for total 2 days.  -D/C probiotic 250 mg BID   Pleuritic Chest Pain - resolved. Pt with similar complaint at prior hospitalization. Pt with no history of CAD. Pt with chronic tenderness to palpation on exam suggestive of possible costochondritis. D-dimer was elevated (2.23) however recent CTA chest on 2/15 with no PE. Troponins were negative and no ischemic changes on 12-lead EKG.  - Continue home tramadol 50 mg BID PRN pain  - Continue home etodolac 500 mg BID  - Continue home fentanyl 50 mcg patch Q 72 hrs  - Monitor on telemetry  Diet: Regular  DVT Ppx: SCDs  Code: Full  Dispo: Disposition is deferred at this time, awaiting improvement of current medical problems  The patient does have a current PCP at [facility] and does need an [facility] hospital follow-up appointment after discharge.  The patient does not have transportation limitations that  hinder transportation to clinic appointments.  .Services Needed at time of discharge: Y = Yes, Blank = No PT: Yes  OT:   RN:   Equipment: Rolling walker with 5" wheels  Other:     LOS: 8 days   Otis Brace, MD 11/26/2013, 11:36 AM

## 2013-11-26 NOTE — Progress Notes (Signed)
Physician notified: Coral CeoGina Collins, PA At: [phone]  Regarding: Clarification- DC remaining CT? Awaiting return response.   Returned Response at: 0823  Order(s): Yes, DC CT pt refused yesterday. Needs to come out today.

## 2013-11-26 NOTE — Progress Notes (Deleted)
Peripherally Inserted Central Catheter/Midline Placement  The IV Nurse has discussed with the patient and/or persons authorized to consent for the patient, the purpose of this procedure and the potential benefits and risks involved with this procedure.  The benefits include less needle sticks, lab draws from the catheter and patient may be discharged home with the catheter.  Risks include, but not limited to, infection, bleeding, blood clot (thrombus formation), and puncture of an artery; nerve damage and irregular heat beat.  Alternatives to this procedure were also discussed.  PICC/Midline Placement Documentation        Stacie GlazeJoyce, Chasitie Passey Horton 11/26/2013, 4:53 PM

## 2013-11-27 ENCOUNTER — Inpatient Hospital Stay (HOSPITAL_COMMUNITY): Payer: Medicaid Other

## 2013-11-27 DIAGNOSIS — R1013 Epigastric pain: Secondary | ICD-10-CM

## 2013-11-27 DIAGNOSIS — E538 Deficiency of other specified B group vitamins: Secondary | ICD-10-CM

## 2013-11-27 LAB — BASIC METABOLIC PANEL
BUN: 12 mg/dL (ref 6–23)
CO2: 33 meq/L — AB (ref 19–32)
CREATININE: 0.56 mg/dL (ref 0.50–1.10)
Calcium: 8.4 mg/dL (ref 8.4–10.5)
Chloride: 99 mEq/L (ref 96–112)
GFR calc non Af Amer: >90 mL/min (ref >90)
Glucose, Bld: 77 mg/dL (ref 70–99)
POTASSIUM: 4.5 meq/L (ref 3.7–5.3)
Sodium: 140 mEq/L (ref 137–147)

## 2013-11-27 LAB — CBC
HEMATOCRIT: 30.5 % — AB (ref 36.0–46.0)
HEMOGLOBIN: 9.4 g/dL — AB (ref 12.0–15.0)
MCH: 27.6 pg (ref 26.0–34.0)
MCHC: 30.8 g/dL (ref 30.0–36.0)
MCV: 89.7 fL (ref 78.0–100.0)
Platelets: 281 10*3/uL (ref 150–400)
RBC: 3.4 MIL/uL — ABNORMAL LOW (ref 3.87–5.11)
RDW: 19 % — ABNORMAL HIGH (ref 11.5–15.5)
WBC: 14.9 10*3/uL — AB (ref 4.0–10.5)

## 2013-11-27 LAB — AFB CULTURE WITH SMEAR (NOT AT ARMC): Acid Fast Smear: NONE SEEN

## 2013-11-27 NOTE — Progress Notes (Signed)
Patient has arrived to unit. Patient placed on telemetry monitor box 3E01, CCMD notified.

## 2013-11-27 NOTE — Progress Notes (Signed)
1241: attempted to call report to [facility] RN. Off floor at this time. Awaiting return call, continue to monitor patient.  1336: Report given to Carlena SaxBlair, Charity fundraiserN. Transporter present to take patient for AM CXR. Will await pt to return and transported to new room.   All belongings sent with patient. VSS. Pt transported to 3E01 by NT via wheelchair. eICU and CCMT notified of transfer on tele.

## 2013-11-27 NOTE — Progress Notes (Signed)
Report received from LamyMorgan, [place] on 3S. Will be expecting patients arrival to unit.

## 2013-11-27 NOTE — Progress Notes (Signed)
  Date: 11/27/2013  Patient name: [name] [name]  Medical record number: [id number]  Date of birth: 01/06/1961   This patient has been seen and the plan of care was discussed with the house staff. Please see their note for complete details. I concur with their findings with the following additions/corrections: I would hold off on tapering prednisone until diagnosis is reached with lung bx results. She is hesitant to leave today. Chest tube has been removed. D/C tomorrow morning if stable. I would continue her on prednisone 40 mg daily until she is seen as outpatient.  Jonah BlueAlejandro Brylan Dec, DO, FACP Faculty [facility] 11/27/2013, 3:12 PM

## 2013-11-27 NOTE — Progress Notes (Signed)
Pulmonary/Critical Care Progress Note:  S:  Feels much better this AM.  Breathing improved, has been up ambulating.  Denies chest pain, SOB.  O:  Vitals reviewed, stable.   Gen:  Sitting in chair watching tv, in NAD.  CV:  RRR, no M/R/G.  Lungs:  CTAB, no W/R/R. Resps even and unlabored.  Abd:  BS x 4, soft, NT/ND.  Ext:  No edema.  A/P:  Recurrent pneumonitis of unclear etiology - partially responsive to steroids. ?ILD? SLE S/p VATS with LUL bx. 3/6  Acute Respiratory Failure/ 02 dep post op  Pleuritic Chest Pain  - Continue Prednisone 40mg  PO daily until follow up with pulmonology, will then slowly taper. - Continue BD's. - Pulm hygiene. - Biopsy results pending. - Could dc to SNF - defer to IMTS.   Rutherford Guysahul Desai, PA - [facility] Pgr: [phone]  or [phone]  Patient seen and examined, agree with above note.  I dictated the care and orders written for this patient under my direction.  Alyson ReedyWesam G Yacoub, MD [phone]

## 2013-11-27 NOTE — Progress Notes (Signed)
Subjective:  Pt seen and examined in AM.  Pt had chest tube removed yesterday and has improving pain at chest tube site. She is  breathing well with no cough, fever, or chills. LE edema is at baseline. She is having normal BM and urination with no abdominal pain, nausea, or vomiting. She was able to ambulate with PT today with no  difficulty.    Objective: Vital signs in last 24 hours: Filed Vitals:   11/27/13 0330 11/27/13 0700 11/27/13 0900 11/27/13 1134  BP: 109/70   119/77  Pulse: 77   104  Temp: 98.2 F (36.8 C) 98.8 F (37.1 C)  98.9 F (37.2 C)  TempSrc: Oral Oral  Oral  Resp: 13   19  Height:      Weight:      SpO2: 99%  100% 100%   Weight change:   Intake/Output Summary (Last 24 hours) at 11/27/13 1237 Last data filed at 11/27/13 0700  Gross per 24 hour  Intake    540 ml  Output    202 ml  Net    338 ml    Physical Exam: Vitals reviewed.  General:  NAD Cardiac: normal rate and rhythm   Pulm: clear to ausculation, no wheezing, rales, or ronchi  Abd: soft, nontender, nondistended, BS present  Ext: trace nonpitting b/l edema   Neuro: alert and oriented X3, moves 4 extremities voluntarily, generalized weakness  Lab Results: Basic Metabolic Panel:  Recent Labs Lab 11/25/13 1558 11/26/13 0613 11/27/13 0620  NA 141 139 140  K 3.9 4.6 4.5  CL 104 100 99  CO2 28 30 33*  GLUCOSE 133* 71 77  BUN 9 11 12   CREATININE 0.51 0.52 0.56  CALCIUM 8.5 8.6 8.4  MG 1.5 2.4  --    Liver Function Tests:  Recent Labs Lab 11/20/13 2204 11/23/13 0432  AST 8 9  ALT 5 5  ALKPHOS 81 51  BILITOT 0.2* <0.2*  PROT 6.9 4.8*  ALBUMIN 2.5* 1.8*   CBC:  Recent Labs Lab 11/26/13 0613 11/27/13 0620  WBC 14.0* 14.9*  HGB 9.7* 9.4*  HCT 31.9* 30.5*  MCV 88.4 89.7  PLT 348 281    Micro Results: Recent Results (from the past 240 hour(s))  CULTURE, BLOOD (ROUTINE X 2)     Status: None   Collection Time    11/18/13  9:45 AM      Result Value Ref Range  Status   Specimen Description BLOOD LEFT ANTECUBITAL   Final   Special Requests BOTTLES DRAWN AEROBIC AND ANAEROBIC 10CC   Final   Culture  Setup Time     Final   Value: 11/18/2013 14:27     Performed at [facility]   Culture     Final   Value: NO GROWTH 5 DAYS     Performed at [facility]   Report Status 11/24/2013 FINAL   Final  CULTURE, BLOOD (ROUTINE X 2)     Status: None   Collection Time    11/18/13 11:32 AM      Result Value Ref Range Status   Specimen Description BLOOD HAND RIGHT   Final   Special Requests BOTTLES DRAWN AEROBIC AND ANAEROBIC 5CC   Final   Culture  Setup Time     Final   Value: 11/18/2013 16:09     Performed at [facility]   Culture     Final   Value: NO GROWTH 5 DAYS  Performed at [facility]   Report Status 11/24/2013 FINAL   Final  URINE CULTURE     Status: None   Collection Time    11/18/13 12:19 PM      Result Value Ref Range Status   Specimen Description URINE, CLEAN CATCH   Final   Special Requests NONE   Final   Culture  Setup Time     Final   Value: 11/18/2013 12:37     Performed at [facility] Count     Final   Value: >=100,000 COLONIES/ML     Performed at [facility]   Culture     Final   Value: Multiple bacterial morphotypes present, none predominant. Suggest appropriate recollection if clinically indicated.     Performed at [facility]   Report Status 11/20/2013 FINAL   Final  URINE CULTURE     Status: None   Collection Time    11/20/13  7:51 PM      Result Value Ref Range Status   Specimen Description URINE, RANDOM   Final   Special Requests NONE   Final   Culture  Setup Time     Final   Value: 11/21/2013 01:18     Performed at [facility] Count     Final   Value: 75,000 COLONIES/ML     Performed at [facility]   Culture     Final   Value: YEAST     Performed at [facility]   Report Status 11/22/2013 FINAL   Final    AFB CULTURE WITH SMEAR     Status: None   Collection Time    11/21/13 10:50 AM      Result Value Ref Range Status   Specimen Description BRONCHIAL WASHINGS LEFT UPPER   Final   Special Requests NONE   Final   ACID FAST SMEAR     Final   Value: NO ACID FAST BACILLI SEEN     Performed at [facility]   Culture     Final   Value: CULTURE WILL BE EXAMINED FOR 6 WEEKS BEFORE ISSUING A FINAL REPORT     Performed at [facility]   Report Status PENDING   Incomplete  CULTURE, RESPIRATORY (NON-EXPECTORATED)     Status: None   Collection Time    11/21/13 10:50 AM      Result Value Ref Range Status   Specimen Description BRONCHIAL WASHINGS LEFT UPPER   Final   Special Requests NONE   Final   Gram Stain     Final   Value: MODERATE WBC PRESENT, PREDOMINANTLY PMN     NO SQUAMOUS EPITHELIAL CELLS SEEN     NO ORGANISMS SEEN     Performed at [facility]   Culture     Final   Value: NO GROWTH 2 DAYS     Performed at [facility]   Report Status 11/24/2013 FINAL   Final  FUNGUS CULTURE W SMEAR     Status: None   Collection Time    11/21/13 10:50 AM      Result Value Ref Range Status   Specimen Description BRONCHIAL WASHINGS LEFT UPPER   Final   Special Requests NONE   Final   Fungal Smear     Final   Value: NO YEAST OR FUNGAL ELEMENTS SEEN     Performed at [facility]  Final   Value: CULTURE IN PROGRESS FOR FOUR WEEKS     Performed at [facility]   Report Status PENDING   Incomplete  AFB CULTURE WITH SMEAR     Status: None   Collection Time    11/21/13 12:06 PM      Result Value Ref Range Status   Specimen Description TISSUE LUNG LEFT   Final   Special Requests LEFT UPPER LOBE   Final   ACID FAST SMEAR     Final   Value: NO ACID FAST BACILLI SEEN     Performed at [facility]   Culture     Final   Value: CULTURE WILL BE EXAMINED FOR 6 WEEKS BEFORE ISSUING A FINAL REPORT     Performed at [facility]    Report Status PENDING   Incomplete  FUNGUS CULTURE W SMEAR     Status: None   Collection Time    11/21/13 12:06 PM      Result Value Ref Range Status   Specimen Description TISSUE LUNG LEFT   Final   Special Requests LEFT UPPER LOBE   Final   Fungal Smear     Final   Value: NO YEAST OR FUNGAL ELEMENTS SEEN     Performed at [facility]   Culture     Final   Value: CULTURE IN PROGRESS FOR FOUR WEEKS     Performed at [facility]   Report Status PENDING   Incomplete  TISSUE CULTURE     Status: None   Collection Time    11/21/13 12:06 PM      Result Value Ref Range Status   Specimen Description TISSUE LUNG LEFT   Final   Special Requests LEFT UPPER LOBE   Final   Gram Stain     Final   Value: ABUNDANT WBC PRESENT,BOTH PMN AND MONONUCLEAR     RARE SQUAMOUS EPITHELIAL CELLS PRESENT     NO ORGANISMS SEEN     Performed at [facility]   Culture     Final   Value: NO GROWTH 3 DAYS     Performed at [facility]   Report Status 11/25/2013 FINAL   Final  MRSA PCR SCREENING     Status: None   Collection Time    11/21/13  3:20 PM      Result Value Ref Range Status   MRSA by PCR NEGATIVE  NEGATIVE Final   Comment:            The GeneXpert MRSA Assay (FDA     approved for NASAL specimens     only), is one component of a     comprehensive MRSA colonization     surveillance program. It is not     intended to diagnose MRSA     infection nor to guide or     monitor treatment for     MRSA infections.   Studies/Results: Dg Chest 2 View  11/26/2013   CLINICAL DATA Post VATS  EXAM CHEST  2 VIEW  COMPARISON DG CHEST 1V PORT dated 11/25/2013; DG CHEST 1V PORT dated 11/23/2013; DG CHEST 1V PORT dated 11/24/2013  FINDINGS Grossly unchanged cardiac silhouette and mediastinal contours. Stable position of support apparatus with unchanged small left apical pneumothorax. Stable postoperative change of the left upper and lower lungs. Minimally improved aeration of the lungs  with persistent left basilar opacities favored to represent atelectasis and/or contusion. No new focal airspace opacities.  No evidence of edema. No definite pleural effusion. Unchanged bones.  IMPRESSION 1. Stable positioning of support apparatus. Unchanged small left apical pneumothorax. 2. Improved aeration the lungs with persistent left basilar atelectasis/contusion.  SIGNATURE  Electronically Signed   By: Simonne Come M.D.   On: 11/26/2013 07:48   Medications: I have reviewed the patient's current medications. Scheduled Meds: . bisacodyl  10 mg Oral Daily  . fentaNYL  50 mcg Transdermal Q72H  . folic acid  1 mg Oral Daily  . ipratropium-albuterol  3 mL Nebulization TID  . levETIRAcetam  500 mg Oral BID  . multivitamin with minerals  1 tablet Oral Daily  . pantoprazole  40 mg Oral BID  . predniSONE  40 mg Oral Q breakfast  . pregabalin  300 mg Oral BID  . vitamin B-12  2,000 mcg Oral Daily   Continuous Infusions:   PRN Meds:.diphenhydrAMINE, diphenhydrAMINE, morphine injection, naloxone, ondansetron (ZOFRAN) IV, oxyCODONE-acetaminophen, polyethylene glycol, potassium chloride, senna-docusate, sodium chloride, SUMAtriptan, traMADol  Assessment/Plan: 53 year old [place] female with past medical history of fibromyalgia, seizure disorder, and migraines with recent multiple admissions for respiratory distress who presents with acute onset chest pain and found to have sepsis with new infiltrates on chest xray.   Recurrent Pneumonitis of unknown etiology, responsive to corticosteroid therapy, s/p uncomplicated VATS with left UL biopsy on 3/6 POD #6 (CT removal 3/9 and 3/11) -  Currently normal SpO2 on RA. Etiology unknown, possibly due to autoimmune disease (sarcoid) vs ILD vs COP. Autoimmune work-up to date unrevealing. Pt is a former smoker with FH of SLE in grandmother. No history of occupational exposures. hecked this admission and negative. She is s/p VATS with left upper lobe biopsy. . -  Appreciate pulmonology and CVTS recommendations - Oxygen therapy as needed to keep SpO2> 92 % (pt will not require previous PRN home O2) - Blood cultures x 2 (11/18/13) > NG final   - Left lung tissue cultures (11/21/13 NG final), AFB, fungal, tissue ---> NGTD  - Left bronchial washings cultures (11/21/13 NG final), AFB, fungal, tissue --> NGTD - Biopsy pathology results pending,  awaiting final dx per pathology office - Continue Duonebs TID  - Continue PO prednisone 40 mg daily   - Obtain hypersensitivity pneumonitis profile > in process  - IV morphine 2 mg Q 4 hr PRN pain  - Pulmonary toliet and ambulate patient TID, PT daily - Pt to have f/u with pulmonology and CVTS (suture removal) - PT/SW/CM consults ---> possible SNF placement  History of Grade 1 Diastolic CHF - Currently  euvolemic. 2D-echo on 1/28 with EF 65-70% with grade 1 diastolic dysfunction. Pt with elevated Pro-BNP 585.4 but lower than previous admission (3533).  - Place and maintain TED stockings --> pt refused - Monitor daily weights and volume status   Seizure Disorder - Pt with no recent seizure activity.  - Continue keppra 500 mg BID   Fibromyalgia - Pt with diffuse body aches and pain unchanged from baseline.  - Continue home tramadol 50 mg BID PRN pain  - Continue home etodolac 500 mg BID  - Continue home fentanyl 50 mcg patch Q 72 hrs - Continue home pregabalin 300 mg BID   Migraine headache - Currently without migraine headache. - Continue home sumatriptan Q 2hr PRN  - Continue pregabalin and Keppra as above  Chronic Normocytic Anemia with folate/B12 deficiency - Hg near baseline of 10. 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 Pt with no recent  active bleeding or hemodynamic instability.  -Monitor for bleeding  -Transfuse if Hg<7  -Continue folate 1 mg daily  - ontinue B12 supplements 2000 mcg daily  -SCDs for DVT ppx, hold heparin  -Continue to monitor   Chronic epigastric pain  - stable.  Etiology unknown, most likely due to gastritis (chronic etodolac use) vs PUD vs GERD. No prior EGD testing.  - IV Zofran Q 6hr for nausea  - Continue protonix 40 mg BID  - Miralax, senokot daily PRN constipation   Vitamin D Deficiency - Pt with recent 25-OH Vitamin D of 13.  - Continue vitamin D 50,000 units every 7 days  Severe Malnutrition and hypoalbuminemia - Pt with chronic low albumin levels. Etiology most likley due to chronic protein malnutrition vs falsely low in setting of acute infection (acute phase reactant).  - Ensure Complete BID --> pt refused  Symptomatic UTI - Resolved. Pt is s/p antibiotics (IV Rocephin). Initial urine culture with 100K multiple bacterial morphotypes present. Repeat urine culture (clean catch) showed only 75,000 col/ml yeast.  C difficile Infection - Resolved. Pt with + c diff toxin on last hospitalization and completed treatment after discharge (2/19 for 10 days). Pt received metronidazole (PO then IV) on 3/3-3/5 for total 2 days.  -D/C probiotic 250 mg BID   Pleuritic Chest Pain - resolved. Pt with similar complaint at prior hospitalization. Pt with no history of CAD. Pt with chronic tenderness to palpation on exam suggestive of possible costochondritis. D-dimer was elevated (2.23) however recent CTA chest on 2/15 with no PE. Troponins were negative and no ischemic changes on 12-lead EKG.  - Continue home tramadol 50 mg BID PRN pain  - Continue home etodolac 500 mg BID  - Continue home fentanyl 50 mcg patch Q 72 hrs  - Monitor on telemetry  Hypokalemia -resolved. Etiology likely due to GI loss (emesis, diarrhea) vs inadequate nutrition. - Monitor BMP, Mg  - Replete as necessary    Diet: Regular  DVT Ppx: SCDs  Code: Full  Dispo: Disposition is deferred at this time, awaiting improvement of current medical problems  The patient does have a current PCP at [facility] and does need an [facility] hospital follow-up appointment after  discharge.  The patient does not have transportation limitations that hinder transportation to clinic appointments.  .Services Needed at time of discharge: Y = Yes, Blank = No PT: Yes  OT:   RN:   Equipment: Rolling walker with 5" wheels  Other:     LOS: 9 days   Otis Brace, MD 11/27/2013, 12:37 PM

## 2013-11-27 NOTE — Progress Notes (Signed)
UR Completed Garin Mata Graves-Bigelow, RN,BSN [phone]  

## 2013-11-27 NOTE — Evaluation (Addendum)
Physical Therapy Evaluation Patient Details Name: [name] [name] MRN: [id number] DOB: 05-Jan-1961 Today's Date: 11/27/2013 Time: 4098-1191 PT Time Calculation (min): 24 min  PT Assessment / Plan / Recommendation History of Present Illness   Pt seen for new evaluation due to PT orders being D/C on 3/6.Pt is a 53 year old [place] female with past medical history of fibromyalgia, seizure disorder, and migraines with recent admissions for CAP (1/18-1/23), HCAP complicated by septic shock and AMS requiring ICU-level care (1/27-2/10/15), and (2/15-19) for acute respiratory failure with treatment with corticosteroid therapy who presented to the hospital on 11/18/13 with recurrent acute onset chest pain and shortness of breath. She has been managed by PCCM/CTS in the immediate postoperative period. She was extubated on 3/6. She received a fentanyl PCA for pain. Biopsy results are pending and will apparently take 1 week at least due to send out to Dr. Adrienne Mocha. They have maintained her on her steroid taper; Solu-Medrol was decreased to 60 every 12 hours today. Her anterior chest tube was removed yesterday.    Clinical Impression  Pt seen for new evaluation due to discontinued orders. Pt continues to be limited in mobility secondary to deficits indicated below (see PT problem list). Pt to benefit from skilled acute PT secondary to mobility deficits and being a high fall risk. Pt agreeable and hopeful to D/C to SNF today. Pt has had recurrent falls at home, unsure if family is able to support her at home at this time. All goals remain appropriate for pt at this time.     PT Assessment  Patient needs continued PT services    Follow Up Recommendations  SNF    Does the patient have the potential to tolerate intense rehabilitation      Barriers to Discharge Decreased caregiver support multiple falls at home    Equipment Recommendations  Rolling walker with 5" wheels    Recommendations for Other  Services OT consult   Frequency Min 3X/week    Precautions / Restrictions Precautions Precautions: Fall Precaution Comments: reports frequent falls Restrictions Weight Bearing Restrictions: No   Pertinent Vitals/Pain 11/10 in bil LEs; premedicated per RN      Mobility  Bed Mobility Overal bed mobility: Needs Assistance Bed Mobility: Supine to Sit Rolling: Min assist General bed mobility comments: pt reaching for (A) to elevate her trunk; cues for sequencing and safety; pt impulsive  Transfers Overall transfer level: Needs assistance Equipment used: 1 person hand held assist Transfers: Sit to/from [employer] Sit to Stand: Min assist Stand pivot transfers: Min assist General transfer comment: initially performed SPT to [facility] due to urgency to go ; unsteady due to pain in LEs; cues for hand placement and safety  Ambulation/Gait Ambulation/Gait assistance: Min assist Ambulation Distance (Feet): 60 Feet Assistive device: 1 person hand held assist Gait Pattern/deviations: Decreased stride length;Shuffle;Narrow base of support Gait velocity: decreased  Gait velocity interpretation: Below normal speed for age/gender General Gait Details: pt reaching for UE support with hand that was not supported; pt unsteady due to pain in LEs; pt given lyrica by RN prior to ambulating; pt more motivated to increase mobility today; cues for sequencing     Exercises General Exercises - Lower Extremity Ankle Circles/Pumps: AROM;Both;10 reps;Seated   PT Diagnosis: Difficulty walking;Generalized weakness;Acute pain  PT Problem List: Decreased strength;Decreased activity tolerance;Decreased balance;Decreased mobility;Decreased knowledge of use of DME;Decreased cognition;Decreased safety awareness;Pain PT Treatment Interventions: DME instruction;Gait training;Functional mobility training;Therapeutic activities;Therapeutic exercise;Balance training;Patient/family education     PT  Goals(Current goals  can be found in the care plan section) Acute Rehab PT Goals Patient Stated Goal: to walk twice today PT Goal Formulation: With patient Time For Goal Achievement: 12/11/13 Potential to Achieve Goals: Good  Visit Information  Last PT Received On: 11/27/13 Assistance Needed: +2       Prior Functioning  Home Living Family/patient expects to be discharged to:: Private residence Living Arrangements: Parent;Other relatives Available Help at Discharge: Family;Available PRN/intermittently Type of Home: House Home Access: Stairs to enter [employer] CorporationEntrance Stairs-Number of Steps: 1 Entrance Stairs-Rails: None Home Equipment: Cane - single point Prior Function Level of Independence: Needs assistance Gait / Transfers Assistance Needed: using cane with assistance from mother ADL's / Homemaking Assistance Needed: Pt reports needing assistance with ADL and transfers Comments: reports incr (A) needed of late  Communication Communication: No difficulties Dominant Hand: Right    Cognition  Cognition Arousal/Alertness: Awake/alert Behavior During Therapy: Anxious Overall Cognitive Status: No family/caregiver present to determine baseline cognitive functioning Area of Impairment: Problem solving;Safety/judgement Safety/Judgement: Decreased awareness of safety Problem Solving: Slow processing;Difficulty sequencing;Requires verbal cues General Comments: pt continues to be reluctant to participate in therapy; with max encouragement was agreeable; impulsive with movement and inattentive to lines and leads    Extremity/Trunk Assessment Upper Extremity Assessment Upper Extremity Assessment: Defer to OT evaluation Lower Extremity Assessment Lower Extremity Assessment: Generalized weakness RLE Deficits / Details: bil Feet with edematous  LLE Deficits / Details: edematous LEs  Cervical / Trunk Assessment Cervical / Trunk Assessment: Kyphotic   Balance Balance Overall balance  assessment: History of Falls;Needs assistance Sitting-balance support: Single extremity supported;Feet supported Sitting balance-Leahy Scale: Fair Standing balance support: During functional activity;Single extremity supported Standing balance-Leahy Scale: Poor Standing balance comment: unsteady and requires (A) to maintain balance; fatigues quickly; (A) to maintain balance while pt performs perineal hygiene   End of Session PT - End of Session Equipment Utilized During Treatment: Gait belt Activity Tolerance: Patient tolerated treatment well Patient left: in chair;with call bell/phone within reach Nurse Communication: Mobility status  GP     Donell SievertWest, Loretha Ure N, [place]PT [phone] 11/27/2013, 12:43 PM

## 2013-11-28 ENCOUNTER — Encounter (HOSPITAL_COMMUNITY): Payer: Self-pay

## 2013-11-28 MED ORDER — FENTANYL 50 MCG/HR TD PT72: 50.0000 ug | MEDICATED_PATCH | TRANSDERMAL | Status: DC

## 2013-11-28 MED ORDER — TRAMADOL HCL 50 MG PO TABS: 50.0000 mg | ORAL_TABLET | Freq: Two times a day (BID) | ORAL | Status: DC | PRN

## 2013-11-28 MED ORDER — PREDNISONE 20 MG PO TABS: 40.0000 mg | ORAL_TABLET | Freq: Every day | ORAL | Status: DC

## 2013-11-28 MED ORDER — ALBUTEROL SULFATE HFA 108 (90 BASE) MCG/ACT IN AERS: 2.0000 | INHALATION_SPRAY | Freq: Four times a day (QID) | RESPIRATORY_TRACT | Status: DC | PRN

## 2013-11-28 MED ORDER — IPRATROPIUM-ALBUTEROL 0.5-2.5 (3) MG/3ML IN SOLN: 3.0000 mL | Freq: Four times a day (QID) | RESPIRATORY_TRACT | Status: DC | PRN

## 2013-11-28 MED ORDER — SODIUM CHLORIDE 0.9 % IJ SOLN
10.0000 mL | INTRAMUSCULAR | Status: DC | PRN
  Administered 2013-11-28: 20 mL

## 2013-11-28 NOTE — Progress Notes (Signed)
Clinical Social Work Department CLINICAL SOCIAL WORK PLACEMENT NOTE 11/28/2013  Patient:  [name],[name]  Account Number:  [id number] Admit date:  11/18/2013  Clinical Social Worker:  Carren RangLINDSAY Britany Callicott, LCSWA  Date/time:  11/28/2013 10:32 AM  Clinical Social Work is seeking post-discharge placement for this patient at the following level of care:   SKILLED NURSING   (*CSW will update this form in Epic as items are completed)   11/25/2013  [facility]family provided with [facility] Department of Clinical Social Work's list of facilities offering this level of care within the geographic area requested by the patient (or if unable, by the patient's family).  11/25/2013  Patient/family informed of their freedom to choose among providers that offer the needed level of care, that participate in Medicare, Medicaid or managed care program needed by the patient, have an available bed and are willing to accept the patient.  11/25/2013  Patient/family informed of [facility]' ownership interest in [facility], as well as of the fact that they are under no obligation to receive care at this facility.  PASARR submitted to EDS on  PASARR number received from EDS on   FL2 transmitted to all facilities in geographic area requested by pt/family on  11/25/2013 FL2 transmitted to all facilities within larger geographic area on   Patient informed that his/her managed care company has contracts with or will negotiate with  certain facilities, including the following:     Patient/family informed of bed offers received:  11/26/2013 [facility] Physician recommends and patient chooses bed at    [facility] to be [facility] on  11/28/2013 Patient to be transferred to facility by   The following physician request were entered in Epic:   Additional Comments:  Maree KrabbeLindsay Zakariah Dejarnette, MSW, Theresia MajorsLCSWA [phone]

## 2013-11-28 NOTE — Progress Notes (Signed)
Pulmonary/Critical Care Progress Note:  S:  Feels much better this AM.  Breathing improved, has been up ambulating.  Denies chest pain, SOB.  O:  Vitals reviewed, stable.   Gen:  Sitting in chair watching tv, in NAD.  CV:  RRR, no M/R/G.  Lungs:  CTAB, no W/R/R. Resps even and unlabored.  Abd:  BS x 4, soft, NT/ND.  Ext:  No edema.  A/P:  Recurrent pneumonitis of unclear etiology - partially responsive to steroids. ?ILD? SLE S/p VATS with LUL bx. 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 Acute Respiratory Failure / 02 dep post op  Pleuritic Chest Pain  - Continue Prednisone 40mg  PO daily until follow up with pulmonology on 12/12/13, will then slowly taper. - Continue BD's. - Pulm hygiene. - Could dc to SNF - defer to IMTS.   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  The granulomatous disease if somewhat curious however, molecular pathology identifies it as old and incidental.  However there is high incidence of BOOP in patient with other auto-immune disorders such as sarcoid and wegners.  Regardless of that however, the treatment will be to complete a course of steroids as delineated above.  Would recommend 40 mg of PO prednisone (approximately 0.75 mg/kg) that will need to be continued for a period of 6 month to include a tapering dose.  F/U with Dr. Marchelle Gearingamaswamy was made as above in 2 wks and depending on her clinical picture will elect to begin the taper or keep treatment going.  PCCM will sign off, please call back if needed.  Patient seen and examined, agree with above note.  I dictated the care and orders written for this patient under my direction.  Alyson ReedyWesam G Yacoub, MD [phone]

## 2013-11-28 NOTE — Discharge Instructions (Signed)
It was a pleasure taking care of you. 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 - Please continue to talk your Prednisone at the dose above (next dose on 3/14). You will need to be on steroids for at least 6 months. -Use albuterol inhaler as needed for shortness of breath - We have arranged for you to follow up with pulmonology, thoracic surgery, and your PCP. - One of the complications of your procedure is a pneumothorax, if you develop sudden shortness of breath, please let a nurse at your facility know as soon as possible.

## 2013-11-28 NOTE — Progress Notes (Signed)
Subjective:  Pt seen and examined in AM.  Pt feeling great and ready to go to SNF today. She is  breathing well with no cough, fever, or chills. LE edema is at baseline. She is having normal BM and urination with no abdominal pain, nausea, or vomiting. She is ambulating without difficulty.    Objective: Vital signs in last 24 hours: Filed Vitals:   11/27/13 1954 11/27/13 2030 11/28/13 0107 11/28/13 0624  BP:  111/69 100/76 110/65  Pulse:  96 97 81  Temp:  98.3 F (36.8 C) 98.2 F (36.8 C) 98.2 F (36.8 C)  TempSrc:  Oral Oral Oral  Resp:  16 18 16   Height:      Weight:    55.883 kg (123 lb 3.2 oz)  SpO2: 100% 98% 98% 99%   Weight change:   Intake/Output Summary (Last 24 hours) at 11/28/13 1149 Last data filed at 11/28/13 1044  Gross per 24 hour  Intake    720 ml  Output    375 ml  Net    345 ml    Physical Exam: Vitals reviewed.  General:  NAD Cardiac: normal rate and rhythm   Pulm: clear to ausculation, no wheezing, rales, or ronchi  Abd: soft, nontender, nondistended, BS present  Ext: trace nonpitting b/l edema   Neuro: alert and oriented X3, moves 4 extremities voluntarily, generalized weakness  Lab Results: Basic Metabolic Panel:  Recent Labs Lab 11/25/13 1558 11/26/13 0613 11/27/13 0620  NA 141 139 140  K 3.9 4.6 4.5  CL 104 100 99  CO2 28 30 33*  GLUCOSE 133* 71 77  BUN 9 11 12   CREATININE 0.51 0.52 0.56  CALCIUM 8.5 8.6 8.4  MG 1.5 2.4  --    Liver Function Tests:  Recent Labs Lab 11/23/13 0432  AST 9  ALT 5  ALKPHOS 51  BILITOT <0.2*  PROT 4.8*  ALBUMIN 1.8*   CBC:  Recent Labs Lab 11/26/13 0613 11/27/13 0620  WBC 14.0* 14.9*  HGB 9.7* 9.4*  HCT 31.9* 30.5*  MCV 88.4 89.7  PLT 348 281    Micro Results: Recent Results (from the past 240 hour(s))  URINE CULTURE     Status: None   Collection Time    11/18/13 12:19 PM      Result Value Ref Range Status   Specimen Description URINE, CLEAN CATCH   Final   Special  Requests NONE   Final   Culture  Setup Time     Final   Value: 11/18/2013 12:37     Performed at [facility] Count     Final   Value: >=100,000 COLONIES/ML     Performed at [facility]   Culture     Final   Value: Multiple bacterial morphotypes present, none predominant. Suggest appropriate recollection if clinically indicated.     Performed at [facility]   Report Status 11/20/2013 FINAL   Final  URINE CULTURE     Status: None   Collection Time    11/20/13  7:51 PM      Result Value Ref Range Status   Specimen Description URINE, RANDOM   Final   Special Requests NONE   Final   Culture  Setup Time     Final   Value: 11/21/2013 01:18     Performed at [facility]   Colony Count     Final   Value: 75,000 COLONIES/ML  Performed at [facility]     Final   Value: YEAST     Performed at [facility]   Report Status 11/22/2013 FINAL   Final  AFB CULTURE WITH SMEAR     Status: None   Collection Time    11/21/13 10:50 AM      Result Value Ref Range Status   Specimen Description BRONCHIAL WASHINGS LEFT UPPER   Final   Special Requests NONE   Final   ACID FAST SMEAR     Final   Value: NO ACID FAST BACILLI SEEN     Performed at [facility]   Culture     Final   Value: CULTURE WILL BE EXAMINED FOR 6 WEEKS BEFORE ISSUING A FINAL REPORT     Performed at [facility]   Report Status PENDING   Incomplete  CULTURE, RESPIRATORY (NON-EXPECTORATED)     Status: None   Collection Time    11/21/13 10:50 AM      Result Value Ref Range Status   Specimen Description BRONCHIAL WASHINGS LEFT UPPER   Final   Special Requests NONE   Final   Gram Stain     Final   Value: MODERATE WBC PRESENT, PREDOMINANTLY PMN     NO SQUAMOUS EPITHELIAL CELLS SEEN     NO ORGANISMS SEEN     Performed at [facility]   Culture     Final   Value: NO GROWTH 2 DAYS     Performed at [facility]   Report Status  11/24/2013 FINAL   Final  FUNGUS CULTURE W SMEAR     Status: None   Collection Time    11/21/13 10:50 AM      Result Value Ref Range Status   Specimen Description BRONCHIAL WASHINGS LEFT UPPER   Final   Special Requests NONE   Final   Fungal Smear     Final   Value: NO YEAST OR FUNGAL ELEMENTS SEEN     Performed at [facility]   Culture     Final   Value: CULTURE IN PROGRESS FOR FOUR WEEKS     Performed at [facility]   Report Status PENDING   Incomplete  AFB CULTURE WITH SMEAR     Status: None   Collection Time    11/21/13 12:06 PM      Result Value Ref Range Status   Specimen Description TISSUE LUNG LEFT   Final   Special Requests LEFT UPPER LOBE   Final   ACID FAST SMEAR     Final   Value: NO ACID FAST BACILLI SEEN     Performed at [facility]   Culture     Final   Value: CULTURE WILL BE EXAMINED FOR 6 WEEKS BEFORE ISSUING A FINAL REPORT     Performed at [facility]   Report Status PENDING   Incomplete  FUNGUS CULTURE W SMEAR     Status: None   Collection Time    11/21/13 12:06 PM      Result Value Ref Range Status   Specimen Description TISSUE LUNG LEFT   Final   Special Requests LEFT UPPER LOBE   Final   Fungal Smear     Final   Value: NO YEAST OR FUNGAL ELEMENTS SEEN     Performed at [facility]   Culture     Final   Value: CULTURE IN PROGRESS FOR FOUR  WEEKS     [facility]formed at [facility]   Report Status PENDING   Incomplete  TISSUE CULTURE     Status: None   Collection Time    11/21/13 12:06 PM      Result Value Ref Range Status   Specimen Description TISSUE LUNG LEFT   Final   Special Requests LEFT UPPER LOBE   Final   Gram Stain     Final   Value: ABUNDANT WBC PRESENT,BOTH PMN AND MONONUCLEAR     RARE SQUAMOUS EPITHELIAL CELLS PRESENT     NO ORGANISMS SEEN     [facility]formed at [facility]   Culture     Final   Value: NO GROWTH 3 DAYS     [facility]formed at [facility]   Report Status 11/25/2013  FINAL   Final  MRSA PCR SCREENING     Status: None   Collection Time    11/21/13  3:20 PM      Result Value Ref Range Status   MRSA by PCR NEGATIVE  NEGATIVE Final   Comment:            The GeneXpert MRSA Assay (FDA     approved for NASAL specimens     only), is one component of a     comprehensive MRSA colonization     surveillance program. It is not     intended to diagnose MRSA     infection nor to guide or     monitor treatment for     MRSA infections.   Studies/Results: Dg Chest 2 View  11/27/2013   CLINICAL DATA:  Status post VATS  EXAM: CHEST  2 VIEW  COMPARISON:  11/26/2013  FINDINGS: The left-sided chest tube is been removed in the interval. A minimal residual left apical pneumothorax is noted stable from the prior exam. A left-sided central venous line is again seen and stable. Similar changes in the left upper lobe are seen to that noted on the prior exam.  IMPRESSION: Patchy infiltrative changes.  Minimal residual pneumothorax in the left apex.   Electronically Signed   By: Alcide CleverMark  Lukens M.D.   On: 11/27/2013 14:36   Medications: I have reviewed the patient's current medications. Scheduled Meds: . bisacodyl  10 mg Oral Daily  . fentaNYL  50 mcg Transdermal Q72H  . folic acid  1 mg Oral Daily  . levETIRAcetam  500 mg Oral BID  . multivitamin with minerals  1 tablet Oral Daily  . pantoprazole  40 mg Oral BID  . predniSONE  40 mg Oral Q breakfast  . pregabalin  300 mg Oral BID  . vitamin B-12  2,000 mcg Oral Daily   Continuous Infusions:   PRN Meds:.diphenhydrAMINE, diphenhydrAMINE, ipratropium-albuterol, morphine injection, naloxone, ondansetron (ZOFRAN) IV, oxyCODONE-acetaminophen, polyethylene glycol, potassium chloride, senna-docusate, sodium chloride, sodium chloride, SUMAtriptan, traMADol  Assessment/Plan: 53 year old [place] female with past medical history of fibromyalgia, seizure disorder, and migraines with recent multiple admissions for respiratory  distress who presents with acute onset chest pain and found to have sepsis with new infiltrates on chest xray.   Recurrent Pneumonitis of unknown etiology, responsive to corticosteroid therapy, s/p uncomplicated VATS with left UL biopsy on 3/6 POD #7 (CT removal 3/9 and 3/11) -  Currently normal SpO2 on RA. Etiology unknown, possibly due to autoimmune disease (sarcoid) vs ILD vs COP. Autoimmune work-up to date unrevealing. Pt is a former smoker with FH of SLE in grandmother. No history of occupational exposures. hecked  this admission and negative. She is s/p VATS with left upper lobe biopsy. . - Appreciate pulmonology and CVTS recommendations - Oxygen therapy as needed to keep SpO2> 92 % (pt will not require previous PRN home O2) - Blood cultures x 2 (11/18/13) > NG final   - Left lung tissue cultures (11/21/13 NG final), AFB, fungal, tissue ---> NGTD  - Left bronchial washings cultures (11/21/13 NG final), AFB, fungal, tissue --> NGTD - Biopsy pathology results indicating COP  - Continue Duonebs TID  - Continue PO prednisone 40 mg daily   - Obtain hypersensitivity pneumonitis profile > in process  - IV morphine 2 mg Q 4 hr PRN pain  - Pulmonary toliet and ambulate patient TID, PT daily - Pt to have f/u with pulmonology and CVTS (suture removal) - PT/SW/CM consults ---> pSNF placement  History of Grade 1 Diastolic CHF - Currently  euvolemic. 2D-echo on 1/28 with EF 65-70% with grade 1 diastolic dysfunction. Pt with elevated Pro-BNP 585.4 but lower than previous admission (3533).  - Place and maintain TED stockings --> pt refused - Monitor daily weights and volume status   Seizure Disorder - Pt with no recent seizure activity.  - Continue keppra 500 mg BID   Fibromyalgia - Pt with diffuse body aches and pain unchanged from baseline.  - Continue home tramadol 50 mg BID PRN pain  - Continue home etodolac 500 mg BID  - Continue home fentanyl 50 mcg patch Q 72 hrs - Continue home pregabalin 300 mg  BID   Migraine headache - Currently without migraine headache. - Continue home sumatriptan Q 2hr PRN  - Continue pregabalin and Keppra as above  Chronic Normocytic Anemia with folate/B12 deficiency - Hg near baseline of 10. 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 Pt with no recent active bleeding or hemodynamic instability.  -Monitor for bleeding  -Transfuse if Hg<7  -Continue folate 1 mg daily  - ontinue B12 supplements 2000 mcg daily  -SCDs for DVT ppx, hold heparin  -Continue to monitor   Chronic epigastric pain - stable.  Etiology unknown, most likely due to gastritis (chronic etodolac use) vs PUD vs GERD. No prior EGD testing.  - IV Zofran Q 6hr for nausea  - Continue protonix 40 mg BID  - Miralax, senokot daily PRN constipation   Vitamin D Deficiency - Pt with recent 25-OH Vitamin D of 13.  - Continue vitamin D 50,000 units every 7 days  Severe Malnutrition and hypoalbuminemia - Pt with chronic low albumin levels. Etiology most likley due to chronic protein malnutrition vs falsely low in setting of acute infection (acute phase reactant).  - Ensure Complete BID --> pt refused  Symptomatic UTI - Resolved. Pt is s/p antibiotics (IV Rocephin). Initial urine culture with 100K multiple bacterial morphotypes present. Repeat urine culture (clean catch) showed only 75,000 col/ml yeast.  C difficile Infection - Resolved. Pt with + c diff toxin on last hospitalization and completed treatment after discharge (2/19 for 10 days). Pt received metronidazole (PO then IV) on 3/3-3/5 for total 2 days.  -D/C probiotic 250 mg BID   Pleuritic Chest Pain - resolved. Pt with similar complaint at prior hospitalization. Pt with no history of CAD. Pt with chronic tenderness to palpation on exam suggestive of possible costochondritis. D-dimer was elevated (2.23) however recent CTA chest on 2/15 with no PE. Troponins were negative and no ischemic changes on 12-lead EKG.  -  Continue home tramadol 50 mg BID PRN  pain  - Continue home etodolac 500 mg BID  - Continue home fentanyl 50 mcg patch Q 72 hrs  - Monitor on telemetry  Hypokalemia -resolved. Etiology likely due to GI loss (emesis, diarrhea) vs inadequate nutrition. - Monitor BMP, Mg  - Replete as necessary    Diet: Regular  DVT Ppx: SCDs  Code: Full  Dispo:today  The patient does have a current PCP at [facility] and does need an [facility] hospital follow-up appointment after discharge.  The patient does not have transportation limitations that hinder transportation to clinic appointments.  .Services Needed at time of discharge: Y = Yes, Blank = No PT: Yes  OT:   RN:   Equipment: Rolling walker with 5" wheels  Other:     LOS: 10 days   Otis Brace, MD 11/28/2013, 11:49 AM

## 2013-11-28 NOTE — Progress Notes (Signed)
  Date: 11/28/2013  Patient name: [name] [name]  Medical record number: [id number]  Date of birth: 09/13/1961   This patient has been seen and the plan of care was discussed with the house staff. Please see their note for complete details. I concur with their findings with the following additions/corrections: Looks clinically well. She is ready to go to SNF. Path report reviewed. She will most definitely need outpatient pulmonary follow up. Continue prednisone 40 mg daily without a taper until she is seen as outpatient.  Jonah BlueAlejandro Atiana Levier, DO, FACP Faculty [facility] 11/28/2013, 2:10 PM

## 2013-11-28 NOTE — Progress Notes (Signed)
CSW spoke to patient about dc to SNF today. Patient states [facility] CSW notified MD.   Maree KrabbeLindsay Traveion Ruddock, MSW, Theresia MajorsLCSWA [phone]

## 2013-11-28 NOTE — Progress Notes (Signed)
Clinical Social Worker facilitated patient discharge by [facility]. Patient agreeable to this plan and arranging transport via family . CSW will sign off, as social work intervention is no longer needed.  Maree KrabbeLindsay Maddy Graham, MSW, Theresia MajorsLCSWA [phone]

## 2013-11-28 NOTE — Progress Notes (Signed)
All d/c  Instructions given to pt's family at the bedside who are transporting her to NH.  Amanda PeaNellie Genna Casimir, Charity fundraiserN.

## 2013-11-28 NOTE — Discharge Summary (Signed)
Name: [name] [name] MRN: [id number] DOB: 06-Apr-1961 53 y.o. 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  Active Problems: Pleuritic Chest Pain  History of Grade 1 Diastolic CHF  Clostrium difficile infection  Symptomatic UTI Hypokalemia and hypomagnesia Migraines  Seizure Disorder Fibromyalgia  Acute on chronic epigastric pain  Chronic Normocytic Anemia  Severe Malnutrition in setting of hypoalbuminemia    Discharge Medications:   Medication List    STOP taking these medications       metroNIDAZOLE 500 MG tablet  Commonly known as:  FLAGYL     potassium chloride 10 MEQ tablet  Commonly known as:  K-DUR     saccharomyces boulardii 250 MG capsule  Commonly known as:  FLORASTOR      TAKE these medications       albuterol 108 (90 BASE) MCG/ACT inhaler  Commonly known as:  PROVENTIL HFA;VENTOLIN HFA  Inhale 2 puffs into the lungs every 6 (six) hours as needed for wheezing or shortness of breath.     B-12 2000 MCG Tabs  Take 2,000 mcg by mouth daily.     etodolac 500 MG tablet  Commonly known as:  LODINE  Take 500 mg by mouth 2 (two) times daily.     fentaNYL 50 MCG/HR  Commonly known as:  DURAGESIC - dosed mcg/hr  Place 1 patch (50 mcg total) onto the skin every 3 (three) days.     fentaNYL 50 MCG/HR  Commonly known as:  DURAGESIC - dosed mcg/hr  Place 1 patch (50 mcg total) onto the skin every 3 (three) days.  Start taking on:  12/01/2013     folic acid 1 MG tablet  Commonly known as:  FOLVITE  Take 1 tablet daily.     levETIRAcetam 500 MG tablet  Commonly known as:  KEPPRA  Take 1 tablet (500 mg total) by mouth 2 (two) times daily.     pantoprazole 40 MG tablet  Commonly known as:  PROTONIX  Take 1 tablet (40 mg total) by mouth 2 (two) times  daily.     polyethylene glycol packet  Commonly known as:  MIRALAX / GLYCOLAX  Take 17 g by mouth daily as needed for mild constipation or moderate constipation.     predniSONE 20 MG tablet  Commonly known as:  DELTASONE  Take 2 tablets (40 mg total) by mouth daily with breakfast.     pregabalin 300 MG capsule  Commonly known as:  LYRICA  Take 1 capsule (300 mg total) by mouth 2 (two) times daily.     rizatriptan 10 MG tablet  Commonly known as:  MAXALT  Take 10 mg by mouth daily as needed for migraine. May repeat in 2 hours if needed     traMADol 50 MG tablet  Commonly known as:  ULTRAM  Take 1 tablet (50 mg total) by mouth every 12 (twelve) hours as needed for moderate pain.     Vitamin D (Ergocalciferol) 50000 UNITS Caps capsule  Commonly known as:  DRISDOL  Take 50,000 Units by mouth every 7 (seven) days. Take on Saturday        Disposition and follow-up:   [name] [name] was discharged from [facility] in Stable condition.  At the hospital follow up visit please address:  1.  Shortness of breath?  Prednisone 40 mg daily (next dose on 3/14) to be continued for a period of 6 months to include a tapering dose. Albuterol as needed for acute bronchospasm.  No oxygen requirement.      Physical therapy for deconditioning       Follow-up with pulmonology and with surgery (suture removal)          2.  Labs / imaging needed at time of follow-up: BMP, CBC, repeat CXR  3.  Pending labs/ test needing follow-up: hypersensitivity pnuemonitis profile  Follow-up Appointments: Follow-up Information   Follow up with [facility] On 12/04/2013. (For suture removal with Dr. Dennie Maizes nurse at 10:00)    Contact information:   [address] [place] [address]       Follow up with [facility] On 12/12/2013. (Your appointment is with Dr. Marchelle Gearing at 3:00 PM.)    Specialty:  Pulmonology   Contact information:   [address]  [place] [address] [phone]      Follow up with [facility]     On 12/03/2013. (@2 :30pm. This is your primary care doctor.)    Contact information:   [address] [place] [address] [phone]      Discharge Instructions:  Future Appointments Provider Department Dept Phone   12/03/2013 2:30 PM [facility] Cover[facility] Provider [facility] [phone]   12/04/2013 10:00 AM [facility] Nurse [facility] [place] [phone]   12/12/2013 3:00 PM Kalman Shan, MD [facility] [phone]      Consultations: Treatment Team:  Delight Ovens, MD  Procedures Performed:  Dg Chest 2 View  11/27/2013   CLINICAL DATA:  Status post VATS  EXAM: CHEST  2 VIEW  COMPARISON:  11/26/2013  FINDINGS: The left-sided chest tube is been removed in the interval. A minimal residual left apical pneumothorax is noted stable from the prior exam. A left-sided central venous line is again seen and stable. Similar changes in the left upper lobe are seen to that noted on the prior exam.  IMPRESSION: Patchy infiltrative changes.  Minimal residual pneumothorax in the left apex.   Electronically Signed   By: Alcide Clever M.D.   On: 11/27/2013 14:36   Dg Chest 2 View  11/26/2013   CLINICAL DATA Post VATS  EXAM CHEST  2 VIEW  COMPARISON DG CHEST 1V PORT dated 11/25/2013; DG CHEST 1V PORT dated 11/23/2013; DG CHEST 1V PORT dated 11/24/2013  FINDINGS Grossly unchanged cardiac silhouette and mediastinal contours. Stable position of support apparatus with unchanged small left apical pneumothorax. Stable postoperative change of the left upper and lower lungs. Minimally improved aeration of the lungs with persistent left basilar opacities favored to represent atelectasis and/or contusion. No new focal airspace opacities. No evidence of edema. No definite pleural effusion. Unchanged bones.  IMPRESSION 1. Stable  positioning of support apparatus. Unchanged small left apical pneumothorax. 2. Improved aeration the lungs with persistent left basilar atelectasis/contusion.  SIGNATURE  Electronically Signed   By: Simonne Come M.D.   On: 11/26/2013 07:48   Dg Chest 2 View  11/18/2013   CLINICAL DATA:  Chest pain and short of breath  EXAM: CHEST  2 VIEW  COMPARISON:  11/05/2013  FINDINGS: Interval development of upper lobe infiltrates bilaterally. Lung bases are clear. No pleural effusion. Negative for heart failure. Heart size is normal.  IMPRESSION: Interval development of upper lobe infiltrates bilaterally, probable pneumonia  Electronically Signed   By: Marlan Palau M.D.   On: 11/18/2013 10:40   Dg Chest 2 View  11/05/2013   CLINICAL DATA:  Bilateral pneumonia.  EXAM: CHEST - 2 VIEW  COMPARISON:  DG CHEST 1V PORT dated 11/03/2013; CT ANGIO CHEST W/CM &/OR WO/CM dated 11/02/2013; DG CHEST 1V PORT dated 11/02/2013; DG CHEST 1V PORT dated 10/21/2013  FINDINGS: Significantly improved aeration bilaterally with essentially near complete resolution of bilateral infiltrates. No edema or pleural fluid is identified. The heart size and mediastinal contours are within normal limits. The visualized skeletal structures are unremarkable.  IMPRESSION: Near complete resolution of bilateral pulmonary infiltrates.   Electronically Signed   By: Irish Lack M.D.   On: 11/05/2013 19:01   Ct Chest Wo Contrast  11/19/2013   CLINICAL DATA:  53 year old female with left side chest pain and shortness of Breath. Worsening upper lobe pulmonary opacity. Recurrent pneumonitis of unknown etiology. Initial encounter.  EXAM: CT CHEST WITHOUT CONTRAST  TECHNIQUE: Multidetector CT imaging of the chest was performed following the standard protocol without IV contrast.  COMPARISON:  Chest radiographs 11/18/2013 and earlier. Chest CTA 11/02/2013.  FINDINGS: Major airways remain patent. Since 11/02/2013, a general pattern of widespread bilateral geographic  and centrilobular ground-glass opacity continues. Upper lobe predominance. Sparing of the immediate subpleural lung. Superimposed peribronchovascular more confluent opacity and septal thickening has progressed, particularly in the left upper lung (series 3, image 13 today versus series 6, image 118 on the CTA) and probably is responsible for the radiographic progression.  No areas of bone a 5 consolidation. Occasional superimposed calcified granulomas are present in both lungs. No pleural or pericardial effusion.  Stable mediastinal nodes, most within normal limits, some maximal in size. Small but increased hilar nodes were better visualized on the prior study with contrast. There is a calcified right hilar node.  Negative non contrast thoracic inlet. No axillary lymphadenopathy. Negative visible non contrast liver, gallbladder, spleen, pancreas, adrenal glands, left kidney, and bowel. Multiple nonobstructing right intra renal upper pole calculi.  No acute osseous abnormality identified.  Osteopenia.  IMPRESSION: 1. Continued pattern of abnormal opacity in the lungs with upper lobe predominance and some sparing of the immediate subpleural lung. Recently progressed septal and peribronchovascular thickening. Of note, this process has waxed and waned (see 10/15/2013 CT). The pattern is nonspecific, but in absence of clinical signs of infection consider cryptogenic organizing pneumonia. 2. Occasional calcified granulomas and hilar nodes in the chest. 3. Right nephrolithiasis.   Electronically Signed   By: Augusto Gamble M.D.   On: 11/19/2013 10:18   Ct Angio Chest Pe W/cm &/or Wo Cm  11/02/2013   CLINICAL DATA:  Worsening shortness of breath  EXAM: CT ANGIOGRAPHY CHEST WITH CONTRAST  TECHNIQUE: Multidetector CT imaging of the chest was performed using the standard protocol during bolus administration of intravenous contrast. Multiplanar CT image reconstructions and MIPs were obtained to evaluate the vascular anatomy.   CONTRAST:  OMNIPAQUE IOHEXOL 350 MG/ML SOLN  COMPARISON:  Prior radiograph from earlier the same day.  FINDINGS: Study is degraded by motion artifact.  The thyroid is normal.  Mediastinal and hilar the adenopathy again seen, slightly improved relative to the prior study. Index prevascular node measures 3.1 x 1.5 cm, previously 3.6 x 2.5 cm, image 95. Right paratracheal node measures approximately 2.1 x 1.4 cm, previously 3.0 x 1.7 cm. Enlarged subcarinal and bilateral hilar adenopathy is slightly improved.  Heart size within normal limits.  Trace pericardial fluid noted.  Evaluation of the pulmonary arteries is somewhat limited due to motion. No filling defects are seen to suggest acute pulmonary embolism. Re-formatted imaging confirms these findings.  Multi focal ground-glass opacities are again seen involving the lungs bilaterally, greatest within the upper lobes. Overall, these opacities are slightly improved. There is involvement of the right middle lobe and lingula, similar to prior. No pulmonary edema or pleural effusion.  Visualized upper abdomen is within normal limits.  No acute osseous abnormality.  IMPRESSION: 1. No evidence of acute pulmonary embolism. 2. Persistent upper lobe predominant bilateral airspace and ground-glass opacities, improved relative to most recent CT from 1/20 8/1 5. Again, multi lobar pneumonia is favored. 3. Mediastinal and bilateral adenopathy is above, slightly decreased in size relative to prior.   Electronically Signed   By: Rise Mu M.D.   On: 11/02/2013 05:04   Dg Chest Port 1 View  11/25/2013   CLINICAL DATA:  Chest tube  EXAM: PORTABLE CHEST - 1 VIEW  COMPARISON:  Prior chest x-ray 11/24/2013  FINDINGS: Stable position of left subclavian approach central venous catheter. The catheter tip projects over the upper SVC min is directed toward the caval wall. Interval removal of the more superior left chest tube. The more inferior left chest tube has been  retracted slightly. No interval change in small left apical pneumothorax. Surgical changes of left upper and lower lobe wedge resections with residual chain sutures. The right lung remains clear. Cardiac and mediastinal contours are within normal limits. No acute osseous abnormality.  IMPRESSION: 1. Stable trace left apical pneumothorax following removal of the more superior of the 2 left chest tubes. The more inferior chest tube has pulled back slightly but remains intra thoracic. 2. Unchanged position of left subclavian central venous catheter. 3. No significant interval change in the appearance of the left lung with expected postsurgical changes.   Electronically Signed   By: Malachy Moan M.D.   On: 11/25/2013 08:20   Dg Chest Port 1 View  11/24/2013   CLINICAL DATA:  Post VATS  EXAM: PORTABLE CHEST - 1 VIEW  COMPARISON:  Portable exam 0633 hr compared to 11/23/2013  FINDINGS: Left subclavian central venous catheter tip projects over SVC.  Left thoracostomy tubes unchanged.  Persistent small left pneumothorax.  Staple lines at left apex and left base post resections.  Remaining lungs clear.  No pleural effusion or acute osseous findings.  IMPRESSION: Persistent small postoperative left pneumothorax despite presence of thoracostomy tubes.   Electronically Signed   By: Ulyses Southward M.D.   On: 11/24/2013 08:14   Dg Chest Portable 1 View  11/23/2013   CLINICAL DATA:  Status post VATS procedure.  EXAM: PORTABLE CHEST - 1 VIEW  COMPARISON:  Chest x-ray 11/22/2013.  FINDINGS: There is a Left-sided subclavian central venous catheter with tip terminating in the mid superior vena cava. 2 left-sided chest tubes remain in position with tips projecting over the medial aspect of the apex of the left hemithorax. Small left apical pneumothorax (less than 5% of the volume of the left hemithorax). Postoperative changes of wedge resection in the left upper lobe again noted. Mild interstitial prominence in the lungs  bilaterally, predominantly in the left upper lobe and periphery of the left lung base, in improved overall compared to prior study 11/18/2013. No pleural effusions. No evidence of pulmonary edema. Heart size is normal. Upper mediastinal contours are within normal limits.  IMPRESSION: 1. Support apparatus, as above. 2. Small left apical pneumothorax (less than 5%  of the volume of the left hemithorax), with 2 left-sided chest tubes which remain appropriately positioned. 3. Decreasing interstitial prominence in lungs bilaterally, as above.   Electronically Signed   By: Trudie Reed M.D.   On: 11/23/2013 09:29   Dg Chest Port 1 View  11/22/2013   CLINICAL DATA:  Left vats  EXAM: PORTABLE CHEST - 1 VIEW  COMPARISON:  11/21/2013  FINDINGS: There is a left subclavian catheter with tip in the projection of the SVC. There are 2 left-sided chest tubes. No significant pneumothorax identified. Stable postoperative changes within the left upper lung.  IMPRESSION: 1. Stable exam. 2. Left chest tubes in place without significant pneumothorax.   Electronically Signed   By: Signa Kell M.D.   On: 11/22/2013 08:52   Dg Chest Portable 1 View  11/21/2013   CLINICAL DATA:  Status post VATS  EXAM: PORTABLE CHEST - 1 VIEW  COMPARISON:  11/18/2013  FINDINGS: Two new chest tubes are noted on the left. No definitive pneumothorax is seen. A left-sided central venous line is seen with the tip in the mid superior vena cava. Patchy changes are noted bilaterally but improved when compared with the prior exam. The cardiac shadow is stable. No bony abnormality is seen.  IMPRESSION: Improved infiltrative changes bilaterally.  Status post VATS without evidence of pneumothorax. Chest tubes are noted in place   Electronically Signed   By: Alcide Clever M.D.   On: 11/21/2013 14:34   Dg Chest Port 1 View  11/03/2013   CLINICAL DATA:  Pneumonia  EXAM: PORTABLE CHEST - 1 VIEW  COMPARISON:  Prior CT and radiograph from 11/02/2013  FINDINGS: The  cardiac and mediastinal silhouettes are stable in size and contour, and remain within normal limits.  Lungs are normally inflated. Again seen are multi focal parenchymal infiltrates involving the bilateral upper lobes and left lower lobe, compatible with pneumonia. Overall, these are not significantly changed. No new focal infiltrate. No pulmonary edema or pleural effusion. No pneumothorax.  Osseous structures are unchanged.  IMPRESSION: No significant interval change and bilateral upper and midlung irregular airspace opacities with more confluent left lower lobe opacity. These findings may be infectious or inflammatory in nature.   Electronically Signed   By: Rise Mu M.D.   On: 11/03/2013 06:03   Dg Chest Port 1 View  11/02/2013   CLINICAL DATA:  Fever, shortness of breath  EXAM: PORTABLE CHEST - 1 VIEW  COMPARISON:  Prior chest x-ray 10/21/2013  FINDINGS: Stable cardiac and mediastinal contours. Increased left lower lobe patchy airspace opacity. Background diffuse interstitial prominence and bilateral central airway thickening is similar compared to prior. No pleural effusion or pneumothorax. No acute osseous abnormality.  IMPRESSION: Developing left basilar opacity concerning for and acute on chronic infectious or inflammatory process.  Persistent bilateral upper an mid lung irregular airspace opacities without significant interval change.   Electronically Signed   By: Malachy Moan M.D.   On: 11/02/2013 01:11   Dg Swallowing Func-speech Pathology  11/03/2013   Riley Nearing [name], CCC-SLP     11/03/2013  2:11 PM Objective Swallowing Evaluation: Modified Barium Swallowing Study   Patient Details  Name: Janei Scheff MRN: [id number] Date of Birth: Jun 09, 1961  Today's Date: 11/03/2013 Time: 1310-1340 SLP Time Calculation (min): 30 min  Past Medical History:  Past Medical History  Diagnosis Date  . Fibromyalgia   . Chronic pain   . Seizures   . Migraine   . Pneumonia    Past Surgical  History:  History reviewed. No pertinent past  surgical history. HPI: Winry Egnew is a 53 y.o. [place] female with PMH  fibromyalgia, chronic pain with a recent admission for CAP  complicated by septic shock requiring ICU-level care  (1/27-2/10/15) presents to the ED with recurrent dyspnea       Assessment / Plan / Recommendation Clinical Impression  Dysphagia Diagnosis: Mild pharyngeal phase dysphagia Clinical impression: Pt presents with a mild oropharygneal  dysphagia. Pt has a slight delay in swallow initiation which  causes episodes of trace penetration/aspiration with sensation  when bolus size with thin liquids is overlarge (straw sips and  when trying to take pills). The pt does not fully expel penetrate  or aspirate with cough. Pt typically does not use straws and  takes careful small sips at baseline. It is unlikely that  infrequent trace aspiration is impacting her pulmonary function  based on findings of today's study. Provided education to pt  regarding limiting bolus size, avoiding straws and taking larger  pills in puree. No f/u therapy or diet modification is needed.     Treatment Recommendation  No treatment recommended at this time    Diet Recommendation Regular;Thin liquid   Liquid Administration via: Cup;No straw Medication Administration: Whole meds with puree Supervision: Patient able to self feed;Intermittent supervision  to cue for compensatory strategies Compensations: Slow rate;Small sips/bites;Follow solids with  liquid Postural Changes and/or Swallow Maneuvers: Seated upright 90  degrees    Other  Recommendations Oral Care Recommendations: Oral care Q4  per protocol;Oral care BID   Follow Up Recommendations  None    Frequency and Duration        Pertinent Vitals/Pain NA    SLP Swallow Goals     General HPI: 53 yo female with recent admission for CAP  re-presents with fever, leukocytosis, chest pain, and hypoxia in  setting of possible seizure vs pre-syncope. Intubated from 1/27  to 2/1. Type  of Study: Modified Barium Swallowing Study Reason for Referral: Objectively evaluate swallowing function Previous Swallow Assessment: BSE 1/22 - suspected esopahgeal  dysphagia. Rec Dys 3/thin with esophageal precautions.  Diet Prior to this Study: Dysphagia 1 (puree) Temperature Spikes Noted: No Respiratory Status: Nasal cannula History of Recent Intubation: No Behavior/Cognition: Alert;Cooperative;Pleasant mood Oral Cavity - Dentition: Adequate natural dentition Self-Feeding Abilities: Able to feed self Patient Positioning: Upright in chair Baseline Vocal Quality: Clear Volitional Cough: Strong Volitional Swallow: Able to elicit    Reason for Referral Objectively evaluate swallowing function   Oral Phase Oral Preparation/Oral Phase Oral Phase: WFL   Pharyngeal Phase Pharyngeal Phase Pharyngeal Phase: Impaired Pharyngeal - Thin Pharyngeal - Thin Cup: Within functional limits Pharyngeal - Thin Straw: Penetration/Aspiration before  swallow;Trace aspiration;Delayed swallow initiation Penetration/Aspiration details (thin straw): Material enters  airway, CONTACTS cords and not ejected out (despite cough  response) Pharyngeal - Solids Pharyngeal - Puree: Within functional limits Pharyngeal - Regular: Within functional limits Pharyngeal - Pill: Delayed swallow  initiation;Penetration/Aspiration before swallow;Trace aspiration Penetration/Aspiration details (pill): Material enters airway,  passes BELOW cords and not ejected out despite cough attempt by  patient  Cervical Esophageal Phase    GO    Cervical Esophageal Phase Cervical Esophageal Phase: WFL (Esophageal sweep did not reveal  any abnormality. No radiolog)        Harlon Ditty, MA CCC-SLP [phone]  Claudine Mouton 11/03/2013, 2:07 PM      OPERATIVE REPORT 11/21/2013  PREOPERATIVE DIAGNOSIS: Bilateral interstitial lung disease with  respiratory failure.  POSTOPERATIVE  DIAGNOSIS: Bilateral interstitial lung disease with  respiratory failure.    SURGICAL PROCEDURE: Bronchoscopy, left video-assisted thoracoscopy with  wedge resections of the upper lobe x2 and the lower lobe x1.  SURGEON: Sheliah Plane, M.D.  FIRST ASSISTANT: Coral Ceo, P.A.  BRIEF HISTORY: The patient is a 53 year old female who has had multiple  admissions for respiratory problems with diffuse pulmonary infiltrates  bilaterally. Because she has been treated with antibiotics and steroids  with little effect, she was referred by Dr. Craige Cotta to consider lung  biopsy. Risks and options were discussed with the patient, and she was  agreeable and signed informed consent.  DESCRIPTION OF PROCEDURE: After appropriate time-out, the patient  underwent general endotracheal anesthesia. Through a single-lumen  endotracheal tube, a fiberoptic bronchoscope was passed to the  subsegmental level. The endobronchial appearance was normal with no  endobronchial lesions and very little in the way of secretions.  Bronchial washings were obtained for culture from the left upper lobe.  The scope was removed. A double-lumen endotracheal tube was then  placed. The patient was then turned in a lateral decubitus position  with the left side up. Skin of the chest and legs was prepped with  Betadine and draped in sterile manner. Three port incisions were  created, one in approximately fifth intercostal space mid axillary line,  one more superior and anterior, and one more superior and posterior.  Through these 3 ports, the left pleural space was examined. Considering  the findings on CT scan, the upper lobe did not appear as abnormal as  would be suspected from the CT scan. Along the major fissure, there  were several barleycorn size nodules on the lung, one of these was  selected in a wedge resection including the area of small nodule which  was wedged with purple stapler and submitted to Pathology. The nodule  itself was very firm and appeared calcified. Additional wedge resection   from the superior part of the upper lobe was also performed, and a  random lung biopsy from the left lower lobe was also performed in a  similar fashion with wedge resection using staplers. The parietal  pleura appeared normal. There was no abnormal studding or inflammation  of nodules appreciated. Two Blake drains were left in, 1 to the  anterior port, 1 to the middle port; the posterior port was closed with  an absorbable stitch in the deep muscle layer and a 3-0 subcuticular,  and Dermabond  was applied. The patient's lung reinflated nicely. She was then  awakened and extubated in the operating room. She tolerated the  procedure without obvious complication. Blood loss was minimal. Sponge  and needle count was reported as correct at completion of the procedure.  Sheliah Plane, MD   Admission HPI:  Tiwana Chavis is a 53 year old [place] female with past medical history of fibromyalgia, seizure disorder, and migraines with recent admissions for CAP (1/18-1/23), HCAP complicated by septic shock and AMS requiring ICU-level care (1/27-2/10/15), and (2/15-19) for acute respiratory failure with treatment with corticosteroid therapy who presents with acute onset chest pain. Pt reports that 1 day while lying down she began having acute onset of left sided chest pain worsened with deep breathing. Pt also reports palpitations and diaphoresis. She has been on 3.5 L of oxygen as needed at home however in the past 2-3 days has been requiring it continuously. She denies fever, but does reports chills and green sputum cough for the past 2 days. She reports compliance with  her steroid taper (for 6 weeks) that she was discharged on. She was also taking her flagyl for c. diff infection as prescribed with improvement of loose stools (last BM yesterday was loose).   On her last admission her O2 sats continued to drop into the upper 80's and she was discharged on home oxygen for PRN use. Repeat CXR the day  prior to admission showed near complete resolution of her bilateral pulmonary infiltrates  She was to follow-up with pulmonology for repeat CT in 4-6 weeks to evaluate for mediastinal lymphadenopathy and possible lung biopsy.    Physical Exam:  Blood pressure 161/106, pulse 102, temperature 99 F (37.2 C), temperature source Oral, resp. rate 24, height 5' 1.02" (1.55 m), weight 56.3 kg (124 lb 1.9 oz), SpO2 97.00%.  Physical Exam  Constitutional: She is oriented to person, place, and time. No distress.  Ill-appearing  HENT:  Head: Normocephalic and atraumatic.  Right Ear: External ear normal.  Left Ear: External ear normal.  Nose: Nose normal.  Mouth/Throat: Oropharynx is clear and moist. No oropharyngeal exudate.  Eyes: Pupils are equal, round, and reactive to light.  Neck: Normal range of motion. Neck supple.  Cardiovascular: Regular rhythm and normal heart sounds.  tachycardic  Pulmonary/Chest: She has rales (diffuse in anterior and posterior (worse in upper lobes)).  Dyspnea with speaking full sentances  Abdominal: Soft. Bowel sounds are normal. She exhibits no distension. There is tenderness (epigastric). There is no rebound and no guarding.  Musculoskeletal: Normal range of motion. She exhibits edema (trace b/l) and tenderness (diffuse ).  Neurological: She is alert and oriented to person, place, and time. No cranial nerve deficit.  Skin: Skin is warm and dry. No rash noted. She is not diaphoretic. No erythema. No pallor.  Psychiatric: She has a normal mood and affect. Her behavior is normal. Judgment and thought content normal.    Hospital Course by problem list: Maxwell Caulieves Lashley is a 53 year old [place] female with past medical history of fibromyalgia, seizure disorder, and migraines with recent admissions for CAP (1/18-1/23), HCAP complicated by septic shock and AMS requiring ICU-level care (1/27-2/10/15), and (2/15-19) for acute respiratory failure with treatment with  corticosteroid therapy who presented to the hospital on 11/18/13 with recurrent acute onset chest pain and shortness of breath.  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 The patient had SIRS criteria of tachypnea (27), tachycardia (128), and leukocytosis with neutrophilia (in setting of corticosteroids) with increased (3.5 to 4 L) oxygen requirement and low oxygen saturation (87%) on admission. A chest xray revealed new bilateral upper lobe infiltrates and diffuse rales on exam. Lactic acid levels were normal. Her leukocytosis resolved on the day after admission. Oxygen therapy was continued to keep the patient's oxygen saturation above 92%. Blood cultures were obtained and showed no growth. IV vancomycin and cefepime were continued for three days for hospital-acquired pneumonia. IV solumedrol was started at 80mg  TID. The patient was followed by pulmonary critical care during her hospitalization. She was given duoneb treatments every six hours.  Her recurrent symptoms are felt to be secondary to steroid-responsive pneumonitis of uncertain etiology. CT chest w/o contrast on 3/4 showed persistent diffuse bilateral ground glass opacities with upper lobe predominance and some sparing of the immediate subpleural lung and recently progressed septal and peribronchovascular thickening. In terms of autoimmune workup, she was only weakly ANA and  pANCA positive. At time of last discharge on 2/19 recommendations were for open lung biopsy should symptoms recur. Thus, the patient had a VATS and upper lobe lung biopsy performed on 3/6 with no complications and her care was temporarily transferred to the ICU. After the surgery, the patient didn't have any complaints, was resting comfortably, and did not have an air leak from her chest tube. She was given a PCA pump  for pain control after surgery, which was stopped after four days. Her arterial line was stopped on the day after surgery, and her foley catheter was removed. Her chest tubes were monitored by x-ray for placement and for good seal until they were removed. She was transferred back to the stepdown unit.   She had a left lung tissue culture taken during the procedure, which showed: AFB shows no acid-fast bacilli to date, Fungus culture no yeast or fungal elements seen, Tissue culture gram stain with abundant WBC present, both PMN and monos, rare squamous epithelial cells, no organisms, no growth to date. She also had bronchial washings of her left upper lung, culture which has shown: AFB shows no acid-fast bacilli to date, Fungus culture no yeast or fungal elements seen, Respiratory culture gram stain with moderate WBC present, mostly PMNs, no squamous epithelial cells, no organisms, no growth to date.   She was extubated on 3/6. Her recovery period was complicated by pain at her chest tube sites and anxiety. Her Solu-Medrol was decreased to 60 every 12 hours, then 30 twice daily, and eventually transitioned to oral prednisone.   Biopsy was performed and read by Dr. Adrienne Mocha, [facility]. It showed organizing pneumonia in both lobes and old calcified necrotic granulomas most consistent with an infectious etiology. 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 The granulomatous disease is curious, but molecular pathology identifies it as old and incidental. There is high incidence of BOOP in patient with other auto-immune disorders. Regardless, the treatment will be to complete a course of steroids, 40 mg of PO prednisone (approximately 0.75 mg/kg) that will need to be continued for a period of 6 months to include a tapering dose. Follow up with pulmonology was arranged.  She worked with PT and OT, who recommended that she spend time at a skilled nursing facility and ambulating with a walker with falls  precautions before transitioning home. Social work also helped the patient apply for [employer] [employer].   2) Pleuritic Chest Pain - The patient had a similar complaint at prior hospitalization. She does not have a history of coronary artery disease. The patient had tenderness to palpation on exam suggestive of possible costochondritis. Her d-dimer was elevated (2.23), however recent CT angiogram of her chest on 2/15 was negative. Pro-BNP was elevated at 585.4 but lower than previous admission (3533).Three sets of troponins were negative as was her EKG for signs of ischemic changes. She was given IV morphine every four hours for pain, as well as continued on her home etodolac, fentanyl, and tramadol. She was monitored on telemetry.The patient reported significant pain after her chest tube was removed, but this pain was controlled with the fentanyl PCA in the short term. The patient reduced her PCA use and was transitioned back to her home pain medication regimen without difficulty before discharge.   3) History of Grade 1 Diastolic CHF - The patient had a 2D-echo on 1/28 that showed an ejection fraction of 65-70% with grade 1 diastolic dysfunction. This finding was not present on 2/16 2D-echo. The patient had  a pro-BNP elevated to 585, which is lower than at her previous admission (3533). The patient received IV Lasix 40 mg on night of admission and her weight and volume status was monitored daily. The patient did not have any flares of CHF with edema during the hospitalization. Weight on discharge was 123 lbs.  4) Clostrium difficile infection - The patient had diarrhea on her last hospitalization that was positive for the C. Difficile toxin. She began treatment with oral metronidazole at discharge. She completed this regimen in the hospital, along with a probiotic. She was admitted on isolation precautions, but these precautions were lifted after the patient was asymptomatic and finished her metronidazole regimen.    5) Symptomatic UTI - The patient had moderate leukocytes, pyuria and few bacteruria. The patient complained of urinary burning and frequency. Urine cultures were collected on 3/3 and showed multiple bacterial morphocytes but no predominant types. The patient was continued on broad-spectrum coverage with vancomycin and cefipime for three days. At discharge, the patient noted no symptoms of UTI.   6) Hypokalemia and hypomagnesia- During the hospitalization the patient's basic metabolic panel showed several instances of hypokalemia and hypermagnesia. This was monitored with daily BMET and repleted as necessary.   7) Migraines - The patient developed a migraine headache over the course of the hospitalization. This was treated with sumatriptan as needed. At discharge, her headache had resolved.   8) Seizure Disorder - The patient has a history of a seizure disorder. She denied any seizure activity during this hospitalization and was continued on her home dose of Keppra.   9) Fibromyalgia - The patient presented with diffuse body aches and pain unchanged from baseline. Her home regimen of fentanyl patches, tramadol, and etodolac was continued. Her home etodolac was stopped as below for concern of epigastric pain 2/2 chronic use.   10) Acute on chronic epigastric pain During the hospitalization the patient reported increased epigastric pain. The etiology of this complaint is unknown, but most likely due to gastritis secondary to chronic etodolac use, peptic ulcer disease, or reflux. The patient had not had prior EGD testing. Her etodolac was stopped, she was given IV zofran for nausea, prescribed miralax for constipation, and continued on protonix.   11) Chronic Normocytic Anemia - The patient had a hemoglobin of 11.2 on admission, above her baseline of 10. Previous anemia panel indicative of anemia of chronic disease with folate and B12 deficiency. The patient did not have any recent active bleeding or  hemodynamic instability. She was monitored for change in hemoglobin levels, but declined blood products if they became medically necessary. She was continued on folate and B12 supplements.   12) Severe Malnutrition in setting of hypoalbuminemia - The patient has chronic protein malnutrition vs falsely low in setting of acute infection. She was ordered Ensure supplements, which she took for several days before refusing them for the remainder of her hospitalization.    Discharge Vitals:   BP 110/65  Pulse 81  Temp(Src) 98.2 F (36.8 C) (Oral)  Resp 16  Ht 5\' 1"  (1.549 m)  Wt 123 lb 3.2 oz (55.883 kg)  BMI 23.29 kg/m2  SpO2 99%  Discharge Labs:  CMP     Component Value Date/Time   NA 140 11/27/2013 0620   K 4.5 11/27/2013 0620   CL 99 11/27/2013 0620   CO2 33* 11/27/2013 0620   GLUCOSE 77 11/27/2013 0620   BUN 12 11/27/2013 0620   CREATININE 0.56 11/27/2013 0620   CREATININE 0.61 11/10/2013  1511   CALCIUM 8.4 11/27/2013 0620   PROT 4.8* 11/23/2013 0432   ALBUMIN 1.8* 11/23/2013 0432   AST 9 11/23/2013 0432   ALT 5 11/23/2013 0432   ALKPHOS 51 11/23/2013 0432   BILITOT <0.2* 11/23/2013 0432   GFRNONAA >90 11/27/2013 0620   GFRAA >90 11/27/2013 0620    CBC    Component Value Date/Time   WBC 14.9* 11/27/2013 0620   RBC 3.40* 11/27/2013 0620   RBC 3.40* 10/07/2013 0300   HGB 9.4* 11/27/2013 0620   HCT 30.5* 11/27/2013 0620   PLT 281 11/27/2013 0620   MCV 89.7 11/27/2013 0620   MCH 27.6 11/27/2013 0620   MCHC 30.8 11/27/2013 0620   RDW 19.0* 11/27/2013 0620   LYMPHSABS 0.9 11/18/2013 0945   MONOABS 1.6* 11/18/2013 0945   EOSABS 0.3 11/18/2013 0945   BASOSABS 0.0 11/18/2013 0945    Signed: Otis Brace, MD  Time Spent on Discharge: 35 minutes Services Ordered on Discharge: None Equipment Ordered on Discharge: None

## 2013-11-29 LAB — HYPERSENSITIVITY PNUEMONITIS PROFILE

## 2013-12-01 ENCOUNTER — Inpatient Hospital Stay: Payer: Self-pay | Admitting: Internal Medicine

## 2013-12-01 NOTE — Discharge Summary (Signed)
  Date: 12/01/2013  Patient name: [name] [name]  Medical record number: [id number]  Date of birth: 10/28/1960   This patient has been seen and the plan of care was discussed with the house staff. Please see their note for complete details. I concur with their findings and plan.  Jonah BlueAlejandro Alise Calais, DO, FACP [facility] 12/01/2013, 4:08 PM

## 2013-12-03 ENCOUNTER — Ambulatory Visit: Payer: Self-pay

## 2013-12-04 ENCOUNTER — Inpatient Hospital Stay: Payer: Self-pay | Admitting: Internal Medicine

## 2013-12-12 ENCOUNTER — Inpatient Hospital Stay: Payer: Self-pay | Admitting: Internal Medicine

## 2013-12-12 ENCOUNTER — Other Ambulatory Visit (INDEPENDENT_AMBULATORY_CARE_PROVIDER_SITE_OTHER): Payer: Medicaid Other

## 2013-12-12 ENCOUNTER — Encounter: Payer: Self-pay | Admitting: Internal Medicine

## 2013-12-12 ENCOUNTER — Telehealth: Payer: Self-pay | Admitting: Internal Medicine

## 2013-12-12 ENCOUNTER — Ambulatory Visit (INDEPENDENT_AMBULATORY_CARE_PROVIDER_SITE_OTHER)
Admission: RE | Admit: 2013-12-12 | Discharge: 2013-12-12 | Disposition: A | Discharge status: Home / Self Care | Payer: Medicaid Other | Source: Ambulatory Visit | Attending: Internal Medicine | Admitting: Internal Medicine

## 2013-12-12 ENCOUNTER — Other Ambulatory Visit: Payer: Self-pay | Admitting: Internal Medicine

## 2013-12-12 ENCOUNTER — Ambulatory Visit (INDEPENDENT_AMBULATORY_CARE_PROVIDER_SITE_OTHER): Payer: Medicaid Other | Admitting: Internal Medicine

## 2013-12-12 VITALS — BP 124/74 | HR 90 | Temp 99.0°F

## 2013-12-12 DIAGNOSIS — J849 Interstitial pulmonary disease, unspecified: Secondary | ICD-10-CM

## 2013-12-12 DIAGNOSIS — J8410 Pulmonary fibrosis, unspecified: Secondary | ICD-10-CM

## 2013-12-12 LAB — CBC WITH DIFFERENTIAL/PLATELET
BASOS PCT: 0.6 % (ref 0.0–3.0)
Basophils Absolute: 0.1 10*3/uL (ref 0.0–0.1)
Eosinophils Absolute: 0 10*3/uL (ref 0.0–0.7)
Eosinophils Relative: 0 % (ref 0.0–5.0)
HEMATOCRIT: 31.5 % — AB (ref 36.0–46.0)
Hemoglobin: 9.9 g/dL — ABNORMAL LOW (ref 12.0–15.0)
LYMPHS ABS: 0.5 10*3/uL — AB (ref 0.7–4.0)
Lymphocytes Relative: 5 % — ABNORMAL LOW (ref 12.0–46.0)
MCHC: 31.3 g/dL (ref 30.0–36.0)
MCV: 87.6 fl (ref 78.0–100.0)
MONO ABS: 0.1 10*3/uL (ref 0.1–1.0)
MONOS PCT: 1.3 % — AB (ref 3.0–12.0)
NEUTROS ABS: 10.1 10*3/uL — AB (ref 1.4–7.7)
Neutrophils Relative %: 93.1 % — ABNORMAL HIGH (ref 43.0–77.0)
Platelets: 166 10*3/uL (ref 150.0–400.0)
RBC: 3.6 Mil/uL — ABNORMAL LOW (ref 3.87–5.11)
RDW: 19.4 % — ABNORMAL HIGH (ref 11.5–14.6)
WBC: 10.9 10*3/uL — ABNORMAL HIGH (ref 4.5–10.5)

## 2013-12-12 LAB — SEDIMENTATION RATE: Sed Rate: 20 mm/hr (ref 0–22)

## 2013-12-12 NOTE — Telephone Encounter (Signed)
Per OV with mw: Patient Instructions      Reduce prednisone to 20 mg daily  Please remember to go to the lab and x-ray department downstairs for your tests - we will call you with the results when they are available. Please schedule a follow up office visit in 4 weeks, sooner if needed with   --  Called and spoke with karen. Advised her of pt instructions. She voiced her understanding and needed nothing further

## 2013-12-12 NOTE — Patient Instructions (Signed)
Reduce prednisone to 20 mg daily   Please remember to go to the lab and x-ray department downstairs for your tests - we will call you with the results when they are available.  Please schedule a follow up office visit in 4 weeks, sooner if needed with cxr

## 2013-12-12 NOTE — Progress Notes (Signed)
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 HFU    Pt reports her breathing is much improved, but not back to her normal baseline.  She c/o tightness in chest for the past 3 wks.      Lab Results  Component Value Date   ESRSEDRATE 93* 11/02/2013     On 40 mg per day at ov and no cough or sob at rest, still feels a little tight with deep breath, esp on L at bx site  No obvious day to day or daytime variabilty or assoc chronic cough or cp or chest tightness, subjective wheeze overt sinus or hb symptoms. No unusual exp hx or h/o childhood pna/ asthma or knowledge of premature birth.  Sleeping ok without nocturnal  or early am exacerbation  of respiratory  c/o's or need for noct saba. Also denies any obvious fluctuation of symptoms with weather or environmental changes or other aggravating or alleviating factors except as outlined above   Current Medications, Allergies, Complete Past Medical History, Past Surgical History, Family History, and Social History were reviewed in [employer] record.  ROS  The following are not active complaints unless bolded sore  throat, dysphagia, dental problems, itching, sneezing,  nasal congestion or excess/ purulent secretions, ear ache,   fever, chills, sweats, unintended wt loss, pleuritic or exertional cp, hemoptysis,  orthopnea pnd or leg swelling, presyncope, palpitations, heartburn, abdominal pain, anorexia, nausea, vomiting, diarrhea  or change in bowel or urinary habits, change in stools or urine, dysuria,hematuria,  rash, arthralgias, visual complaints, headache, numbness weakness or ataxia or problems with walking or coordination,  change in mood/affect or memory.         Review of Systems     Objective:   Physical Exam    w/c bound pr female nad   Wt Readings from Last 3 Encounters:  11/28/13 123 lb 3.2 oz (55.883 kg)  11/28/13 123 lb 3.2 oz (55.883 kg)  11/10/13 124 lb 3.2 oz (56.337 kg)       HEENT: nl dentition, turbinates, and orophanx. Nl external ear canals without cough reflex   NECK :  without JVD/Nodes/TM/ nl carotid upstrokes bilaterally   LUNGS: no acc muscle use, clear to A and P bilaterally without cough on insp or exp maneuvers   CV:  RRR  no s3 or murmur or increase in P2, no edema   ABD:  soft and nontender with nl excursion in the supine position. No bruits or organomegaly, bowel sounds nl  MS:  warm without deformities, calf tenderness, cyanosis or clubbing  SKIN: warm and dry without lesions    NEURO:  alert, approp, no  deficits    CXR  12/12/2013 : There is no evidence of a pneumothorax or pneumomediastinum nor other active cardiopulmonary disease    ESR 12/12/2013 =  20 Assessment:

## 2013-12-13 NOTE — Assessment & Plan Note (Signed)
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  The goal with a chronic steroid dependent illness is always arriving at the lowest effective dose that controls the disease/symptoms and not accepting a set "formula" which is based on statistics or guidelines that don't always take into account patient  variability or the natural hx of the dz in every individual patient, which may well vary over time.  For now therefore I recommend the patient maintain  20 mg x one month then return for serial esr and cxr's 

## 2013-12-15 NOTE — Progress Notes (Signed)
Quick Note:  LMTCB ______ 

## 2013-12-16 NOTE — Progress Notes (Signed)
Quick Note:  Spoke with pt and notified of results per Dr. Wert. Pt verbalized understanding and denied any questions.  ______ 

## 2013-12-18 LAB — FUNGUS CULTURE W SMEAR
FUNGAL SMEAR: NONE SEEN
FUNGAL SMEAR: NONE SEEN

## 2014-01-03 LAB — AFB CULTURE WITH SMEAR (NOT AT ARMC)
ACID FAST SMEAR: NONE SEEN
Acid Fast Smear: NONE SEEN

## 2014-01-06 ENCOUNTER — Encounter: Payer: Self-pay | Admitting: Internal Medicine

## 2014-01-06 ENCOUNTER — Other Ambulatory Visit (INDEPENDENT_AMBULATORY_CARE_PROVIDER_SITE_OTHER): Payer: Medicaid Other

## 2014-01-06 ENCOUNTER — Ambulatory Visit (INDEPENDENT_AMBULATORY_CARE_PROVIDER_SITE_OTHER)
Admission: RE | Admit: 2014-01-06 | Discharge: 2014-01-06 | Disposition: A | Discharge status: Home / Self Care | Payer: Medicaid Other | Source: Ambulatory Visit | Attending: Internal Medicine | Admitting: Internal Medicine

## 2014-01-06 ENCOUNTER — Ambulatory Visit (INDEPENDENT_AMBULATORY_CARE_PROVIDER_SITE_OTHER): Payer: Medicaid Other | Admitting: Internal Medicine

## 2014-01-06 ENCOUNTER — Telehealth: Payer: Self-pay | Admitting: Internal Medicine

## 2014-01-06 VITALS — BP 130/84 | HR 107 | Temp 98.0°F | Ht 61.0 in | Wt 133.0 lb

## 2014-01-06 DIAGNOSIS — J849 Interstitial pulmonary disease, unspecified: Secondary | ICD-10-CM

## 2014-01-06 DIAGNOSIS — R0609 Other forms of dyspnea: Secondary | ICD-10-CM

## 2014-01-06 DIAGNOSIS — J8410 Pulmonary fibrosis, unspecified: Secondary | ICD-10-CM

## 2014-01-06 DIAGNOSIS — R0989 Other specified symptoms and signs involving the circulatory and respiratory systems: Secondary | ICD-10-CM

## 2014-01-06 DIAGNOSIS — R0600 Dyspnea, unspecified: Secondary | ICD-10-CM

## 2014-01-06 LAB — CBC WITH DIFFERENTIAL/PLATELET
Basophils Absolute: 0.1 10*3/uL (ref 0.0–0.1)
Basophils Relative: 0.3 % (ref 0.0–3.0)
EOS ABS: 0 10*3/uL (ref 0.0–0.7)
EOS PCT: 0.1 % (ref 0.0–5.0)
HEMATOCRIT: 40.8 % (ref 36.0–46.0)
Hemoglobin: 13 g/dL (ref 12.0–15.0)
LYMPHS ABS: 0.6 10*3/uL — AB (ref 0.7–4.0)
Lymphocytes Relative: 3.1 % — ABNORMAL LOW (ref 12.0–46.0)
MCHC: 32 g/dL (ref 30.0–36.0)
MCV: 81.4 fl (ref 78.0–100.0)
Monocytes Absolute: 0.9 10*3/uL (ref 0.1–1.0)
Monocytes Relative: 4.6 % (ref 3.0–12.0)
Neutro Abs: 17.9 10*3/uL — ABNORMAL HIGH (ref 1.4–7.7)
Neutrophils Relative %: 91.9 % — ABNORMAL HIGH (ref 43.0–77.0)
PLATELETS: 266 10*3/uL (ref 150.0–400.0)
RBC: 5.01 Mil/uL (ref 3.87–5.11)
RDW: 17 % — ABNORMAL HIGH (ref 11.5–14.6)
WBC: 19.5 10*3/uL — AB (ref 4.5–10.5)

## 2014-01-06 LAB — BASIC METABOLIC PANEL
BUN: 18 mg/dL (ref 6–23)
CALCIUM: 9.6 mg/dL (ref 8.4–10.5)
CO2: 25 meq/L (ref 19–32)
Chloride: 104 mEq/L (ref 96–112)
Creatinine, Ser: 0.8 mg/dL (ref 0.4–1.2)
GFR: 80.15 mL/min (ref >60.00)
Glucose, Bld: 92 mg/dL (ref 70–99)
Potassium: 3.7 mEq/L (ref 3.5–5.1)
Sodium: 141 mEq/L (ref 135–145)

## 2014-01-06 LAB — BRAIN NATRIURETIC PEPTIDE: Pro B Natriuretic peptide (BNP): 48 pg/mL (ref 0.0–100.0)

## 2014-01-06 LAB — SEDIMENTATION RATE: SED RATE: 54 mm/h — AB (ref 0–22)

## 2014-01-06 NOTE — Telephone Encounter (Signed)
Per OV 01/06/14; Keep your previous appt to see me and in meantime see your pain doctor       Reduce prednisone to 20 mg daily  Please remember to go to the lab and x-ray department downstairs for your tests - we will call you with the results when they are available. Please schedule a follow up office visit in 4 weeks, sooner if needed with   ---  Called made pt aware she need to contact her pain doc for the fentanyl patch. She voiced her understanding and needed nothing further.

## 2014-01-06 NOTE — Progress Notes (Signed)
Quick Note:  Spoke with pt and notified of results per Dr. Wert. Pt verbalized understanding and denied any questions.  ______ 

## 2014-01-06 NOTE — Patient Instructions (Addendum)
For cough try desym 2 tsp every 12 hours as needed  GERD (REFLUX)  is an extremely common cause of respiratory symptoms, many times with no significant heartburn at all.    It can be treated with medication, but also with lifestyle changes including avoidance of late meals, excessive alcohol, smoking cessation, and avoid fatty foods, chocolate, peppermint, colas, red wine, and acidic juices such as orange juice.  NO MINT OR MENTHOL PRODUCTS SO NO COUGH DROPS  USE SUGARLESS CANDY INSTEAD (jolley ranchers or Stover's or life savers) NO OIL BASED VITAMINS - use powdered substitutes.  Reduce prednisone to 20 mg daily  Please remember to go to the lab  department downstairs for your tests - we will call you with the results when they are available.     Keep your previous appt to see me and in meantime see your pain doctor Add increase pred to 40 mg daily Plan CTa next step

## 2014-01-06 NOTE — Progress Notes (Signed)
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 HFU    Pt reports her breathing is much improved, but not back to her normal baseline.  She c/o tightness in chest for the past 3 wks.      Lab Results  Component Value Date   ESRSEDRATE 93* 11/02/2013    On 40 mg per day at ov and no cough or sob at rest, still feels a little tight with deep breath, esp on L at bx site rec Reduce prednisone to 20 mg daily based on clear cxr and esr =20   01/06/2014 f/u ov/[name] [name] re: BOOP Chief Complaint  Patient presents with  . Follow-up    CXR done today--Increased sob, wheezing and tightness in chest and some cough worsening x5 days  was doing ok until first week in April 2015 more sob even sitting still / more chest  tight and more dry cough  saba no change dep, increased prednisone to 40 mg since around 01/02/14 > no better  No obvious day to day or daytime variabilty or assoc chronic cough or cp or chest tightness, subjective wheeze overt sinus or hb symptoms. No unusual exp hx or h/o childhood pna/ asthma or knowledge of premature birth.  Sleeping ok without nocturnal   or early am exacerbation  of respiratory  c/o's or need for noct saba. Also denies any obvious fluctuation of symptoms with weather or environmental changes or other aggravating or alleviating factors except as outlined above   Current Medications, Allergies, Complete Past Medical History, Past Surgical History, Family History, and Social History were reviewed in [employer] record.  ROS  The following are not active complaints unless bolded sore throat, dysphagia, dental problems, itching, sneezing,  nasal congestion or excess/ purulent secretions, ear ache,   fever, chills, sweats, unintended wt loss, pleuritic or exertional cp, hemoptysis,  orthopnea pnd or leg swelling, presyncope, palpitations, heartburn, abdominal pain, anorexia, nausea, vomiting, diarrhea  or change in bowel or urinary habits, change in stools or urine, dysuria,hematuria,  rash, arthralgias, visual complaints, headache, numbness weakness or ataxia or problems with walking or coordination,  change in mood/affect or memory.               Objective:   Physical Exam   w/c bound pr female nad   01/06/2014        133  Wt Readings from Last 3 Encounters:  11/28/13 123 lb 3.2 oz (55.883 kg)  11/28/13 123 lb 3.2 oz (55.883 kg)  11/10/13 124 lb 3.2 oz (56.337 kg)       HEENT: nl dentition, turbinates, and orophanx. Nl external ear  canals without cough reflex   NECK :  without JVD/Nodes/TM/ nl carotid upstrokes bilaterally   LUNGS: no acc muscle use, clear to A and P bilaterally without cough on insp or exp maneuvers   CV:  RRR  no s3 or murmur or increase in P2, no edema   ABD:  soft and nontender with nl excursion in the supine position. No bruits or organomegaly, bowel sounds nl  MS:  warm without deformities, calf tenderness, cyanosis or clubbing  SKIN: warm and dry without lesions    NEURO:  alert, approp, no deficits       CXR  01/06/2014 :  Postsurgical changes. No evidence of  acute cardiopulmonary disease.      Recent Labs Lab 01/06/14 1147  NA 141  K 3.7  CL 104  CO2 25  BUN 18  CREATININE 0.8  GLUCOSE 92    Recent Labs Lab 01/06/14 1147  HGB 13.0  HCT 40.8  WBC 19.5*  PLT 266.0        Lab Results  Component Value Date   ESRSEDRATE 54* 01/06/2014   ESRSEDRATE 20 12/12/2013   ESRSEDRATE 93* 11/02/2013    Assessment:

## 2014-01-07 ENCOUNTER — Other Ambulatory Visit: Payer: Self-pay | Admitting: Internal Medicine

## 2014-01-07 DIAGNOSIS — R0600 Dyspnea, unspecified: Secondary | ICD-10-CM

## 2014-01-07 LAB — D-DIMER, QUANTITATIVE (NOT AT ARMC): D-Dimer, Quant: 0.61 ug/mL-FEU — ABNORMAL HIGH (ref 0.00–0.48)

## 2014-01-07 NOTE — Assessment & Plan Note (Addendum)
No clear cause for her decline though elevated esr is suggestive BOOP is back and d dimer relateively low but doesn't exclude small PEs  and she is at risk > rec CTa to be complete

## 2014-01-07 NOTE — Assessment & Plan Note (Signed)
  Lab Results  Component Value Date   ESRSEDRATE 54* 01/06/2014   ESRSEDRATE 20 12/12/2013   ESRSEDRATE 93* 11/02/2013     Will increase back to 40 mg per day for now

## 2014-01-07 NOTE — Progress Notes (Signed)
Quick Note:  Spoke with pt and notified of results per Dr. Wert. Pt verbalized understanding and denied any questions.  ______ 

## 2014-01-08 ENCOUNTER — Ambulatory Visit (INDEPENDENT_AMBULATORY_CARE_PROVIDER_SITE_OTHER)
Admission: RE | Admit: 2014-01-08 | Discharge: 2014-01-08 | Disposition: A | Discharge status: Home / Self Care | Payer: Medicaid Other | Source: Ambulatory Visit | Attending: Internal Medicine | Admitting: Internal Medicine

## 2014-01-08 DIAGNOSIS — R0989 Other specified symptoms and signs involving the circulatory and respiratory systems: Secondary | ICD-10-CM

## 2014-01-08 DIAGNOSIS — R0609 Other forms of dyspnea: Secondary | ICD-10-CM

## 2014-01-08 DIAGNOSIS — R0600 Dyspnea, unspecified: Secondary | ICD-10-CM

## 2014-01-08 MED ORDER — IOHEXOL 350 MG/ML SOLN
80.0000 mL | Freq: Once | INTRAVENOUS | Status: AC | PRN
  Administered 2014-01-08: 80 mL via INTRAVENOUS

## 2014-01-09 ENCOUNTER — Encounter: Payer: Self-pay | Admitting: Internal Medicine

## 2014-01-09 ENCOUNTER — Ambulatory Visit: Payer: Self-pay | Admitting: Internal Medicine

## 2014-01-09 NOTE — Progress Notes (Signed)
Quick Note:  LMTCB ______ 

## 2014-01-12 NOTE — Progress Notes (Signed)
Quick Note:  Spoke with pt and notified of results per Dr. Wert. Pt verbalized understanding and denied any questions.  ______ 

## 2014-01-20 ENCOUNTER — Telehealth: Payer: Self-pay | Admitting: Internal Medicine

## 2014-01-20 MED ORDER — PREDNISONE 20 MG PO TABS: 40.0000 mg | ORAL_TABLET | Freq: Every day | ORAL | Status: DC

## 2014-01-20 NOTE — Telephone Encounter (Signed)
Spoke with pt. [facility] [address] This has been sent in. Nothing further was needed.

## 2014-02-03 ENCOUNTER — Ambulatory Visit: Payer: Self-pay | Admitting: Internal Medicine

## 2014-02-10 ENCOUNTER — Telehealth: Payer: Self-pay | Admitting: Internal Medicine

## 2014-02-10 MED ORDER — TRAMADOL HCL 50 MG PO TABS: 50.0000 mg | ORAL_TABLET | ORAL | Status: DC | PRN

## 2014-02-10 NOTE — Telephone Encounter (Signed)
Pt is requesting refills on Tramadol and Vitamin D. I do not see where we have given these medications to her.  MW - please advise. Thanks.

## 2014-02-10 NOTE — Telephone Encounter (Signed)
Vit D is otc rec 1000 units powdered   Ok on tramdol 50 mg 1-2 every 4 h prn cough #40

## 2014-02-10 NOTE — Telephone Encounter (Signed)
Pt is aware of MW's response. Rx has been sent in.

## 2014-02-16 ENCOUNTER — Ambulatory Visit: Payer: Self-pay | Admitting: Internal Medicine

## 2014-02-23 ENCOUNTER — Encounter: Payer: Self-pay | Admitting: Internal Medicine

## 2014-02-27 ENCOUNTER — Other Ambulatory Visit: Payer: Self-pay | Admitting: Emergency Medicine

## 2014-02-27 DIAGNOSIS — R0602 Shortness of breath: Secondary | ICD-10-CM

## 2014-02-27 MED ORDER — ALBUTEROL SULFATE (2.5 MG/3ML) 0.083% IN NEBU
2.5000 mg | INHALATION_SOLUTION | Freq: Once | RESPIRATORY_TRACT | Status: AC
  Administered 2014-02-27: 2.5 mg via RESPIRATORY_TRACT

## 2014-03-03 ENCOUNTER — Encounter: Payer: Self-pay | Admitting: Internal Medicine

## 2014-03-03 ENCOUNTER — Encounter (INDEPENDENT_AMBULATORY_CARE_PROVIDER_SITE_OTHER): Payer: Self-pay

## 2014-03-03 ENCOUNTER — Telehealth: Payer: Self-pay | Admitting: Internal Medicine

## 2014-03-03 ENCOUNTER — Ambulatory Visit (INDEPENDENT_AMBULATORY_CARE_PROVIDER_SITE_OTHER): Payer: Medicaid Other | Admitting: Internal Medicine

## 2014-03-03 ENCOUNTER — Other Ambulatory Visit (INDEPENDENT_AMBULATORY_CARE_PROVIDER_SITE_OTHER): Payer: Medicaid Other

## 2014-03-03 VITALS — BP 116/68 | HR 104 | Temp 97.8°F | Ht 62.0 in | Wt 132.0 lb

## 2014-03-03 DIAGNOSIS — R0609 Other forms of dyspnea: Secondary | ICD-10-CM | POA: Diagnosis not present

## 2014-03-03 DIAGNOSIS — R0600 Dyspnea, unspecified: Secondary | ICD-10-CM

## 2014-03-03 DIAGNOSIS — R0989 Other specified symptoms and signs involving the circulatory and respiratory systems: Secondary | ICD-10-CM

## 2014-03-03 DIAGNOSIS — J8410 Pulmonary fibrosis, unspecified: Secondary | ICD-10-CM

## 2014-03-03 DIAGNOSIS — J849 Interstitial pulmonary disease, unspecified: Secondary | ICD-10-CM

## 2014-03-03 LAB — BASIC METABOLIC PANEL
BUN: 17 mg/dL (ref 6–23)
CO2: 27 mEq/L (ref 19–32)
Calcium: 9.2 mg/dL (ref 8.4–10.5)
Chloride: 104 mEq/L (ref 96–112)
Creatinine, Ser: 1.3 mg/dL — ABNORMAL HIGH (ref 0.4–1.2)
GFR: 46.57 mL/min — AB (ref >60.00)
GLUCOSE: 103 mg/dL — AB (ref 70–99)
POTASSIUM: 3.8 meq/L (ref 3.5–5.1)
SODIUM: 140 meq/L (ref 135–145)

## 2014-03-03 LAB — CBC WITH DIFFERENTIAL/PLATELET
BASOS PCT: 0.8 % (ref 0.0–3.0)
Basophils Absolute: 0.2 10*3/uL — ABNORMAL HIGH (ref 0.0–0.1)
EOS PCT: 0.4 % (ref 0.0–5.0)
Eosinophils Absolute: 0.1 10*3/uL (ref 0.0–0.7)
HCT: 43.8 % (ref 36.0–46.0)
HEMOGLOBIN: 13.8 g/dL (ref 12.0–15.0)
Lymphocytes Relative: 5.9 % — ABNORMAL LOW (ref 12.0–46.0)
Lymphs Abs: 1.5 10*3/uL (ref 0.7–4.0)
MCHC: 31.5 g/dL (ref 30.0–36.0)
MCV: 76.6 fl — ABNORMAL LOW (ref 78.0–100.0)
MONOS PCT: 2.7 % — AB (ref 3.0–12.0)
Monocytes Absolute: 0.7 10*3/uL (ref 0.1–1.0)
NEUTROS ABS: 23.7 10*3/uL — AB (ref 1.4–7.7)
Neutrophils Relative %: 90.2 % — ABNORMAL HIGH (ref 43.0–77.0)
Platelets: 291 10*3/uL (ref 150.0–400.0)
RBC: 5.72 Mil/uL — AB (ref 3.87–5.11)
RDW: 17.3 % — ABNORMAL HIGH (ref 11.5–15.5)
WBC: 26.3 10*3/uL (ref 4.0–10.5)

## 2014-03-03 LAB — SEDIMENTATION RATE: Sed Rate: 29 mm/hr — ABNORMAL HIGH (ref 0–22)

## 2014-03-03 LAB — BRAIN NATRIURETIC PEPTIDE: PRO B NATRI PEPTIDE: 33 pg/mL (ref 0.0–100.0)

## 2014-03-03 LAB — TSH: TSH: 0.69 u[IU]/mL (ref 0.35–4.50)

## 2014-03-03 MED ORDER — ALBUTEROL SULFATE HFA 108 (90 BASE) MCG/ACT IN AERS: 2.0000 | INHALATION_SPRAY | Freq: Four times a day (QID) | RESPIRATORY_TRACT | Status: DC | PRN

## 2014-03-03 MED ORDER — PANTOPRAZOLE SODIUM 40 MG PO TBEC: 40.0000 mg | DELAYED_RELEASE_TABLET | Freq: Two times a day (BID) | ORAL | Status: DC

## 2014-03-03 MED ORDER — PREDNISONE 20 MG PO TABS: 40.0000 mg | ORAL_TABLET | Freq: Every day | ORAL | Status: DC

## 2014-03-03 MED ORDER — TRAMADOL HCL 50 MG PO TABS: 50.0000 mg | ORAL_TABLET | ORAL | Status: DC | PRN

## 2014-03-03 NOTE — Telephone Encounter (Signed)
ATC x 2 busy signal. wcb 

## 2014-03-03 NOTE — Telephone Encounter (Signed)
Protonix and Albuterol has been renewed at pt local pharmacy [facility] [address]  Pt aware

## 2014-03-03 NOTE — Patient Instructions (Addendum)
Please see patient coordinator before you leave today  to schedule overnight 02 on RA and CT chest  Please remember to go to the lab   department downstairs for your tests - we will call you with the results when they are available.

## 2014-03-03 NOTE — Progress Notes (Signed)
Quick Note:  Spoke with pt and notified of results per Dr. Wert. Pt verbalized understanding and denied any questions.  ______ 

## 2014-03-03 NOTE — Telephone Encounter (Signed)
Pt states she needs proair HFU called into.  Her sister is @ pharmacy to p/u.  [name]

## 2014-03-03 NOTE — Telephone Encounter (Signed)
[facility] They have 2 albuterol hfa in que to be filled from our office.  Per pharmacist, apologizes for confusion--will work on this now and pt friend which is at pharmacy now will be able to leave with Rx in a few minutes.  Nothing further needed.

## 2014-03-03 NOTE — Progress Notes (Signed)
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 HFU    Pt reports her breathing is much improved, but not back to her normal baseline.  She c/o tightness in chest for the past 3 wks.      Lab Results  Component Value Date   ESRSEDRATE 93* 11/02/2013    On 40 mg per day at ov and no cough or sob at rest, still feels a little tight with deep breath, esp on L at bx site rec Reduce prednisone to 20 mg daily based on clear cxr and esr =20   01/06/2014 f/u ov/Wert re: BOOP Chief Complaint  Patient presents with  . Follow-up    CXR done today--Increased sob, wheezing and tightness in chest and some cough worsening x5 days  was doing ok until first week in April 2015 more sob even sitting still / more chest  tight and more dry cough  saba no change dep, increased prednisone to 40 mg since around 01/02/14 > no better rec For cough try desym 2 tsp every 12 hours as needed GERD diet  Recheck esr = 54 so increase pred to 40 mg daily    CTa 01/08/14 1. No embolus or aortic dissection.  2. Persistent mosaic attenuation in the lungs but without the  confluent ground-glass opacities,  suggesting an improvement in the  organizing pneumonia.    03/03/2014 f/u ov/Wert re: f/u boop on pred 40 mg daily  Chief Complaint  Patient presents with  . Acute Visit    Pt c/o increased SOB for the past month- gets SOB with or w/o exertion.       No obvious day to day or daytime variabilty or assoc chronic cough or cp or chest tightness, subjective wheeze overt sinus or hb symptoms. No unusual exp hx or h/o childhood pna/ asthma or knowledge of premature birth.  Sleeping ok without nocturnal  or early am exacerbation  of respiratory  c/o's or need for noct saba. Also denies any obvious fluctuation of symptoms with weather or environmental changes or other aggravating or alleviating factors except as outlined above   Current Medications, Allergies, Complete Past Medical History, Past Surgical History, Family History, and Social History were reviewed in [employer] record.  ROS  The following are not active complaints unless bolded sore throat, dysphagia, dental problems, itching, sneezing,  nasal congestion or excess/ purulent secretions, ear ache,   fever, chills, sweats, unintended wt loss, pleuritic or exertional cp, hemoptysis,  orthopnea pnd or leg swelling, presyncope, palpitations, heartburn, abdominal pain, anorexia, nausea, vomiting, diarrhea  or change in bowel or urinary habits, change in stools or urine,  dysuria,hematuria,  rash, arthralgias, visual complaints, headache, numbness weakness or ataxia or problems with walking or coordination,  change in mood/affect or memory.               Objective:   Physical Exam  amb  pr female nad   01/06/2014        133 > 03/03/2014  132  Wt Readings from Last 3 Encounters:  11/28/13 123 lb 3.2 oz (55.883 kg)  11/28/13 123 lb 3.2 oz (55.883 kg)  11/10/13 124 lb 3.2 oz (56.337 kg)       HEENT: nl dentition, turbinates, and orophanx. Nl external ear canals without cough reflex   NECK :  without  JVD/Nodes/TM/ nl carotid upstrokes bilaterally   LUNGS: no acc muscle use, clear to A and P bilaterally without cough on insp or exp maneuvers   CV:  RRR  no s3 or murmur or increase in P2, no edema   ABD:  soft and nontender with nl excursion in the supine position. No bruits or organomegaly, bowel sounds nl  MS:  warm without deformities, calf tenderness, cyanosis or clubbing  SKIN: warm and dry without lesions    NEURO:  alert, approp, no deficits       CXR  01/06/2014 : Postsurgical changes. No evidence of acute cardiopulmonary disease.      Recent Labs Lab 03/03/14 1252  NA 140  K 3.8  CL 104  CO2 27  BUN 17  CREATININE 1.3*  GLUCOSE 103*    Recent Labs Lab 03/03/14 1252  HGB 13.8  HCT 43.8  WBC 26.3 Repeated and verified X2.*  PLT 291.0      Lab Results  Component Value Date   ESRSEDRATE 29* 03/03/2014   ESRSEDRATE 54* 01/06/2014   ESRSEDRATE 20 12/12/2013      Lab Results  Component Value Date   TSH 0.69 03/03/2014     Lab Results  Component Value Date   PROBNP 33.0 03/03/2014    Assessment:

## 2014-03-04 ENCOUNTER — Ambulatory Visit (INDEPENDENT_AMBULATORY_CARE_PROVIDER_SITE_OTHER)
Admission: RE | Admit: 2014-03-04 | Discharge: 2014-03-04 | Disposition: A | Discharge status: Home / Self Care | Payer: Medicaid Other | Source: Ambulatory Visit | Attending: Internal Medicine | Admitting: Internal Medicine

## 2014-03-04 DIAGNOSIS — R0609 Other forms of dyspnea: Secondary | ICD-10-CM

## 2014-03-04 DIAGNOSIS — R0989 Other specified symptoms and signs involving the circulatory and respiratory systems: Secondary | ICD-10-CM

## 2014-03-04 DIAGNOSIS — R0600 Dyspnea, unspecified: Secondary | ICD-10-CM

## 2014-03-04 MED ORDER — IOHEXOL 350 MG/ML SOLN
80.0000 mL | Freq: Once | INTRAVENOUS | Status: AC | PRN
  Administered 2014-03-04: 80 mL via INTRAVENOUS

## 2014-03-04 NOTE — Assessment & Plan Note (Addendum)
Symptoms are markedly disproportionate to objective findings and not clear this is a lung problem but pt does appear to have difficult airway management issues. DDX of  difficult airways management all start with A and  include Adherence, Ace Inhibitors, Acid Reflux, Active Sinus Disease, Alpha 1 Antitripsin deficiency, Anxiety masquerading as Airways dz,  ABPA,  allergy(esp in young), Aspiration (esp in elderly), Adverse effects of DPI,  Active smokers, plus two Bs  = Bronchiectasis and Beta blocker use..and one C= CHF  Adherence is always the initial "prime suspect" and is a multilayered concern that requires a "trust but verify" approach in every patient - starting with knowing how to use medications, especially inhalers, correctly, keeping up with refills and understanding the fundamental difference between maintenance and prns vs those medications only taken for a very short course and then stopped and not refilled.   ? Acid (or non-acid) GERD > always difficult to exclude as up to 75% of pts in some series report no assoc GI/ Heartburn symptoms> rec max (24h)  acid suppression and diet restrictions/ reviewed and instructions given in writing.   ? Anxiety, usually dx of exclusion but near the top of this short list  ? chf > excluded with BNP <<100  ? Allergy/asthma > very unlikely on this much prednisone  rec redo the cta to be complete since the last one was still abn and wean prednisone, which may be contributing to anxiety   See instructions for specific recommendations which were reviewed directly with the patient who was given a copy with highlighter outlining the key components.

## 2014-03-04 NOTE — Assessment & Plan Note (Signed)
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  For now therefore I recommend the patient maintain  A ceiling of 40 and work toward a goal of 20 mg daily

## 2014-03-05 ENCOUNTER — Encounter: Payer: Self-pay | Admitting: Internal Medicine

## 2014-03-05 NOTE — Progress Notes (Signed)
Quick Note:  LMTCB ______ 

## 2014-03-06 ENCOUNTER — Other Ambulatory Visit: Payer: Self-pay | Admitting: Internal Medicine

## 2014-03-06 ENCOUNTER — Encounter: Payer: Self-pay | Admitting: Internal Medicine

## 2014-03-06 ENCOUNTER — Ambulatory Visit (HOSPITAL_COMMUNITY)
Admission: RE | Admit: 2014-03-06 | Discharge: 2014-03-06 | Disposition: A | Discharge status: Home / Self Care | Payer: Medicaid Other | Source: Ambulatory Visit | Attending: Cardiology | Admitting: Cardiology

## 2014-03-06 ENCOUNTER — Other Ambulatory Visit: Payer: Self-pay

## 2014-03-06 ENCOUNTER — Ambulatory Visit: Payer: Medicaid Other | Attending: Internal Medicine | Admitting: Internal Medicine

## 2014-03-06 VITALS — BP 136/95 | HR 82 | Temp 99.0°F | Resp 14 | Ht 61.0 in | Wt 134.0 lb

## 2014-03-06 DIAGNOSIS — Z87891 Personal history of nicotine dependence: Secondary | ICD-10-CM | POA: Insufficient documentation

## 2014-03-06 DIAGNOSIS — R071 Chest pain on breathing: Secondary | ICD-10-CM | POA: Insufficient documentation

## 2014-03-06 DIAGNOSIS — IMO0001 Reserved for inherently not codable concepts without codable children: Secondary | ICD-10-CM | POA: Diagnosis not present

## 2014-03-06 DIAGNOSIS — D72829 Elevated white blood cell count, unspecified: Secondary | ICD-10-CM | POA: Insufficient documentation

## 2014-03-06 DIAGNOSIS — R569 Unspecified convulsions: Secondary | ICD-10-CM | POA: Insufficient documentation

## 2014-03-06 DIAGNOSIS — R8290 Unspecified abnormal findings in urine: Secondary | ICD-10-CM

## 2014-03-06 DIAGNOSIS — R82998 Other abnormal findings in urine: Secondary | ICD-10-CM | POA: Insufficient documentation

## 2014-03-06 DIAGNOSIS — M797 Fibromyalgia: Secondary | ICD-10-CM

## 2014-03-06 DIAGNOSIS — J849 Interstitial pulmonary disease, unspecified: Secondary | ICD-10-CM

## 2014-03-06 LAB — COMPLETE METABOLIC PANEL WITH GFR
ALK PHOS: 71 U/L (ref 39–117)
ALT: 22 U/L (ref 0–35)
AST: 19 U/L (ref 0–37)
Albumin: 3.5 g/dL (ref 3.5–5.2)
BILIRUBIN TOTAL: 0.4 mg/dL (ref 0.2–1.2)
BUN: 15 mg/dL (ref 6–23)
CO2: 26 mEq/L (ref 19–32)
CREATININE: 0.86 mg/dL (ref 0.50–1.10)
Calcium: 9.7 mg/dL (ref 8.4–10.5)
Chloride: 102 mEq/L (ref 96–112)
GFR, Est African American: >89 mL/min
GFR, Est Non African American: 78 mL/min
Glucose, Bld: 90 mg/dL (ref 70–99)
Potassium: 4 mEq/L (ref 3.5–5.3)
Sodium: 141 mEq/L (ref 135–145)
Total Protein: 7 g/dL (ref 6.0–8.3)

## 2014-03-06 LAB — POCT URINALYSIS DIPSTICK
Glucose, UA: NEGATIVE
Ketones, UA: NEGATIVE
LEUKOCYTES UA: NEGATIVE
NITRITE UA: NEGATIVE
PH UA: 6.5
PROTEIN UA: NEGATIVE
RBC UA: NEGATIVE
Spec Grav, UA: 1.025
UROBILINOGEN UA: 0.2

## 2014-03-06 MED ORDER — POTASSIUM CHLORIDE ER 10 MEQ PO TBCR: 10.0000 meq | EXTENDED_RELEASE_TABLET | Freq: Every day | ORAL | Status: DC

## 2014-03-06 MED ORDER — POLYETHYLENE GLYCOL 3350 17 G PO PACK: 17.0000 g | PACK | Freq: Every day | ORAL | Status: AC | PRN

## 2014-03-06 MED ORDER — FOLIC ACID 1 MG PO TABS: ORAL_TABLET | ORAL | Status: DC

## 2014-03-06 MED ORDER — LEVETIRACETAM 500 MG PO TABS: 500.0000 mg | ORAL_TABLET | Freq: Two times a day (BID) | ORAL | Status: DC

## 2014-03-06 MED ORDER — PREGABALIN 100 MG PO CAPS: 100.0000 mg | ORAL_CAPSULE | Freq: Two times a day (BID) | ORAL | Status: DC

## 2014-03-06 NOTE — Progress Notes (Signed)
Patient ID: [name] [name], female   DOB: 09/11/1961, 53 y.o.   MRN: [id number]  CC: follow up  HPI:  Patient is here for a follow up visit.  She reports that she is having chest tightness for past two days.  Patient describes that the tightness is present on inspiration.  She has been using her inhalers prescribed to her by her pulmonologist.  She reports mild palpitations yesterday at rest.   She reports weakness, SOB, and paresthesia in her right arm and right great toe. Patient reports that she is on a host of medications from different providers for pain.  She states that she has been taking Lyrica 300 mg TID and using a fentayl patch with no pain relief.    Allergies  Allergen Reactions  . Shellfish Allergy Anaphylaxis   Past Medical History  Diagnosis Date  . Fibromyalgia   . Chronic pain   . Seizures   . Migraine   . Pneumonia    Current Outpatient Prescriptions on File Prior to Visit  Medication Sig Dispense Refill  . albuterol (PROVENTIL HFA;VENTOLIN HFA) 108 (90 BASE) MCG/ACT inhaler Inhale 2 puffs into the lungs every 6 (six) hours as needed for wheezing or shortness of breath.  1 Inhaler  2  . Cyanocobalamin (B-12) 2000 MCG TABS Take 2,000 mcg by mouth daily.  30 tablet  12  . fentaNYL (DURAGESIC - DOSED MCG/HR) 50 MCG/HR Place 1 patch (50 mcg total) onto the skin every 3 (three) days.  5 patch  0  . folic acid (FOLVITE) 1 MG tablet Take 1 tablet daily.  30 tablet  0  . pantoprazole (PROTONIX) 40 MG tablet Take 1 tablet (40 mg total) by mouth 2 (two) times daily.  60 tablet  6  . polyethylene glycol (MIRALAX / GLYCOLAX) packet Take 17 g by mouth daily as needed for mild constipation or moderate constipation.  14 each  0  . potassium chloride (K-DUR) 10 MEQ tablet Take 10 mEq by mouth daily.       . predniSONE (DELTASONE) 20 MG tablet Take 2 tablets (40 mg total) by mouth daily with breakfast.  60 tablet  5  . pregabalin (LYRICA) 300 MG capsule Take 300 mg by mouth 3 (three) times  daily.      . rizatriptan (MAXALT) 10 MG tablet Take 10 mg by mouth daily as needed for migraine. May repeat in 2 hours if needed      . traMADol (ULTRAM) 50 MG tablet Take 1-2 tablets (50-100 mg total) by mouth every 4 (four) hours as needed (cough).  40 tablet  0  . Vitamin D, Ergocalciferol, (DRISDOL) 50000 UNITS CAPS capsule Take 50,000 Units by mouth every 7 (seven) days. Take on Saturday       No current facility-administered medications on file prior to visit.   History reviewed. No pertinent family history. History   Social History  . Marital Status: Single    Spouse Name: N/A    Number of Children: N/A  . Years of Education: N/A   Occupational History  . Not on file.   Social History Main Topics  . Smoking status: Former Smoker -- 1.00 packs/day for 10 years    Types: Cigarettes    Quit date: 09/18/2004  . Smokeless tobacco: Never Used  . Alcohol Use: No  . Drug Use: No  . Sexual Activity: No   Other Topics Concern  . Not on file   Social History Narrative  . No narrative on  file    Review of Systems: See HPI   Objective:   Filed Vitals:   03/06/14 1454  BP: 136/95  Pulse: 82  Temp: 99 F (37.2 C)  Resp: 14   Physical Exam  Vitals reviewed. Constitutional: She is oriented to person, place, and time. She appears well-nourished. No distress.  Neck: Normal range of motion. Neck supple.  Cardiovascular: Normal rate, regular rhythm and normal heart sounds.   Pulmonary/Chest: Effort normal and breath sounds normal.  Abdominal: Soft. Bowel sounds are normal. She exhibits no distension. There is tenderness (bilateral lower quadrant ).  Left CVA tenderness  Musculoskeletal: Normal range of motion.  Neurological: She is oriented to person, place, and time. She has normal reflexes. No cranial nerve deficit.  Skin: Skin is warm and dry.  Psychiatric:  Patient appears dazed, likely from pain medication   ' Tenderness in BL quadrants of abdomin and left CVA  tenderness   Lab Results  Component Value Date   WBC 26.3 Repeated and verified X2.* 03/03/2014   HGB 13.8 03/03/2014   HCT 43.8 03/03/2014   MCV 76.6* 03/03/2014   PLT 291.0 03/03/2014   Lab Results  Component Value Date   CREATININE 1.3* 03/03/2014   BUN 17 03/03/2014   NA 140 03/03/2014   K 3.8 03/03/2014   CL 104 03/03/2014   CO2 27 03/03/2014    No results found for this basename: HGBA1C   Lipid Panel  No results found for this basename: chol, trig, hdl, cholhdl, vldl, ldlcalc       Assessment and plan:   [name] [name] was seen today for follow-up.  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  Patient CT was negative for PE.  Patient evaluated for infections   Leukocytosis Likely d/t chronic steroid use  - POCT urinalysis dipstick-negative Seizures -May continue levETIRAcetam (KEPPRA) 500 MG tablet; Take 1 tablet (500 mg total) by mouth 2 (two) times daily. - folic acid (FOLVITE) 1 MG tablet; Take 1 tablet daily.  Fibromyalgia - Ambulatory referral to Pain Clinic - polyethylene glycol (MIRALAX / GLYCOLAX) packet; Take 17 g by mouth daily as needed for mild constipation or moderate constipation. - potassium chloride (K-DUR) 10 MEQ tablet; Take 1 tablet (10 mEq total) by mouth daily. -Decreased to pregabalin (LYRICA) 100 MG capsule; Take 1 capsule (100 mg total) by mouth 2 (two) times daily d/t elevated creatinine.   Abnormal finding in urine - COMPLETE METABOLIC PANEL WITH GFR  Return in about 3 months (around 06/06/2014).  Patient will follow up with Dr. Hyman HopesJegede for multiple comorbidities.       Holland CommonsKECK, VALERIE, NP-C [facility] [phone] 03/15/2014, 3:13 PM

## 2014-03-06 NOTE — Progress Notes (Signed)
Pt is here following up on her COPD and her fibromyalgia. Pt reports having chest pain for 2 days.  Pt states that she has pins and needles in her extremities that are very painful and wakes her up at night.

## 2014-03-09 ENCOUNTER — Telehealth: Payer: Self-pay | Admitting: *Deleted

## 2014-03-09 ENCOUNTER — Telehealth: Payer: Self-pay | Admitting: Internal Medicine

## 2014-03-09 NOTE — Telephone Encounter (Signed)
Pt has called in today to inquire about her medication for Lyrica; please f/u with pt about payment for Lyrica

## 2014-03-09 NOTE — Telephone Encounter (Signed)
Message copied by Raynelle CharyWINFREE, DUSTIN R on Mon Mar 09, 2014 10:14 AM ------      Message from: Ambrose FinlandKECK, VALERIE A      Created: Sun Mar 08, 2014  7:57 PM       Let her know her kidney function has returned to normal. Thanks ------

## 2014-03-09 NOTE — Telephone Encounter (Signed)
PT. Is calling for the third time today! Needs prescription for Lyrica to be authorized  in order to get her refill.Marland Kitchen.Marland Kitchen.Marland Kitchen.I explained to the patient that we have a 48 hour protocol for the nurse to process this authorization...patient stated that this is very urgent...please contact patient as soon possible.Marland Kitchen.Marland Kitchen..Marland Kitchen

## 2014-03-09 NOTE — Telephone Encounter (Signed)
I informed the pt that her kidney function has returned to normal.

## 2014-03-10 ENCOUNTER — Telehealth: Payer: Self-pay | Admitting: *Deleted

## 2014-03-10 ENCOUNTER — Telehealth: Payer: Self-pay | Admitting: Internal Medicine

## 2014-03-10 NOTE — Telephone Encounter (Signed)
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 All questions have been answered. Offered pt ana ppt but she will call back if she can come in. Nothing further needed

## 2014-03-10 NOTE — Telephone Encounter (Signed)
Spoke with daughter. She scheduled pt an appt to see MW 03/18/14. Nothing further needed

## 2014-03-10 NOTE — Telephone Encounter (Signed)
Called patient to let her know PA for Lyrica was denied.

## 2014-03-10 NOTE — Telephone Encounter (Signed)
Informed patient that her PA for Lyrica has been put into the system, and that we are waiting to see if she is approved. Patient stated the original prescription is supposed to be for 300 mg twice a day instead of 100 mg twice a day. Informed patient we need to see if Medicaid approves medication first and go from there. Patient verbalized understanding. Annamaria Hellingose,Chief Walkup Renee, RN

## 2014-03-15 NOTE — Telephone Encounter (Signed)
Please call patient and let her know that d/t kidney function I only placed her on lyrica 100 mg TID. If lyrica cannot be approved she can try gabapentin.  You may also send a pain management referral if she still needs refills on her fentanyl patch.

## 2014-03-16 ENCOUNTER — Telehealth: Payer: Self-pay | Admitting: Internal Medicine

## 2014-03-16 ENCOUNTER — Encounter: Payer: Self-pay | Admitting: Internal Medicine

## 2014-03-16 DIAGNOSIS — G4734 Idiopathic sleep related nonobstructive alveolar hypoventilation: Secondary | ICD-10-CM | POA: Insufficient documentation

## 2014-03-16 NOTE — Telephone Encounter (Signed)
Pt has called in today to request a refill on medication pregabalin (LYRICA) 100 MG capsule; pt states that script was denied because medication dosage/information is incorrect and would like to speak with Noreene LarssonJill about what medicaid has told her; please f/u with pt [phone]

## 2014-03-17 NOTE — Telephone Encounter (Signed)
Message never routed to triage on 6.29.15  ONO was ordered at the 6.16.15 ov w/ MW - sent to [facility] per pt's chart Called [facility] at [phone] and spoke with Iris > results to be faxed to the triage fax machine Will await results and [facility]  Called spoke with patient and informed her of the above.  Pt okay with this and verbalized her understanding.  She is aware we will call once MW interprets the results.

## 2014-03-17 NOTE — Telephone Encounter (Signed)
I have reviewed it but too complicated to go into over the phone, [facility]

## 2014-03-17 NOTE — Telephone Encounter (Signed)
Called spoke with patient, she is okay with waiting until 7.1.15 ov w/ MW to discuss ONO results.

## 2014-03-17 NOTE — Telephone Encounter (Signed)
ONO received and placed in MW's lookat Dr Sherene SiresWert please advise, thank you!

## 2014-03-18 ENCOUNTER — Ambulatory Visit (INDEPENDENT_AMBULATORY_CARE_PROVIDER_SITE_OTHER): Payer: Medicaid Other | Admitting: Internal Medicine

## 2014-03-18 ENCOUNTER — Encounter: Payer: Self-pay | Admitting: Internal Medicine

## 2014-03-18 VITALS — BP 92/60 | HR 107 | Temp 98.4°F | Ht 61.0 in | Wt 134.0 lb

## 2014-03-18 DIAGNOSIS — G4734 Idiopathic sleep related nonobstructive alveolar hypoventilation: Secondary | ICD-10-CM

## 2014-03-18 DIAGNOSIS — J8489 Other specified interstitial pulmonary diseases: Secondary | ICD-10-CM

## 2014-03-18 DIAGNOSIS — J8409 Other alveolar and parieto-alveolar conditions: Secondary | ICD-10-CM

## 2014-03-18 MED ORDER — TRAMADOL HCL 50 MG PO TABS: 50.0000 mg | ORAL_TABLET | ORAL | Status: DC | PRN

## 2014-03-18 MED ORDER — FENTANYL 50 MCG/HR TD PT72: 50.0000 ug | MEDICATED_PATCH | TRANSDERMAL | Status: DC

## 2014-03-18 MED ORDER — PREGABALIN 300 MG PO CAPS: 300.0000 mg | ORAL_CAPSULE | Freq: Two times a day (BID) | ORAL | Status: DC

## 2014-03-18 NOTE — Telephone Encounter (Signed)
Called patient. Unable to leave a message. No answer machine.

## 2014-03-18 NOTE — Patient Instructions (Addendum)
Please see patient coordinator before you leave today  to schedule resume 02 at previous level and repeat the ono on that amount of 02 to be sure it's adequate  For now stay on Prednisone at 40 mg daily   Protonix 40 mg Take 30- 60 min before your first and last meals of the day   Only use the lodine as needed for joint stiffness   [facility] and return here as needed

## 2014-03-18 NOTE — Progress Notes (Signed)
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 HFU    Pt reports her breathing is much improved, but not back to her normal baseline.  She c/o tightness in chest for the past 3 wks.      Lab Results  Component Value Date   ESRSEDRATE 93* 11/02/2013    On 40 mg per day at ov and no cough or sob at rest, still feels a little tight with deep breath, esp on L at bx site rec Reduce prednisone to 20 mg daily based on clear cxr and esr =20   01/06/2014 f/u ov/Kelsen Celona re: BOOP Chief Complaint  Patient presents with  . Follow-up    CXR done today--Increased sob, wheezing and tightness in chest and some cough worsening x5 days  was doing ok until first week in April 2015 more sob even sitting still / more chest  tight and more dry cough  saba no change dep, increased prednisone to 40 mg since around 01/02/14 > no better rec For cough try desym 2 tsp every 12 hours as needed GERD diet  Recheck esr = 54 so increase pred to 40 mg daily    CTa 01/08/14 1. No embolus or aortic dissection.  2. Persistent mosaic attenuation in the lungs but without the  confluent ground-glass opacities,  suggesting an improvement in the  organizing pneumonia.    03/03/2014 f/u ov/Truman Aceituno re: f/u boop on pred 40 mg daily  Chief Complaint  Patient presents with  . Acute Visit    Pt c/o increased SOB for the past month- gets SOB with or w/o exertion.     rec CT chest > worse fibrosis Lab Results  Component Value Date   ESRSEDRATE 29* 03/03/2014   rec maint on 40 mg daily    03/19/2014 f/u ov/Rohan Juenger re:  Chief Complaint  Patient presents with  . Follow-up    Pt here to review ONO and CT Chest results. She reports having increased SOB since her last visit.  She also c/o CP "sometimes sharp" comes and goes x 2 days. She is using rescue inhaler 1 to 2 times per day  cps are fleeting migratory no pleuritic or exertional     No obvious day to day or daytime variabilty or assoc chronic cough or cp or chest tightness, subjective wheeze overt sinus or hb symptoms. No unusual exp hx or h/o childhood pna/ asthma or knowledge of premature birth.  Sleeping ok without nocturnal  or early am exacerbation  of respiratory  c/o's or need for noct saba. Also denies any obvious fluctuation of symptoms with weather or environmental changes or other aggravating or alleviating  factors except as outlined above   Current Medications, Allergies, Complete Past Medical History, Past Surgical History, Family History, and Social History were reviewed in [employer] record.  ROS  The following are not active complaints unless bolded sore throat, dysphagia, dental problems, itching, sneezing,  nasal congestion or excess/ purulent secretions, ear ache,   fever, chills, sweats, unintended wt loss, pleuritic or exertional cp, hemoptysis,  orthopnea pnd or leg swelling, presyncope, palpitations, heartburn, abdominal pain, anorexia, nausea, vomiting, diarrhea  or change in bowel or urinary habits, change in stools or urine, dysuria,hematuria,  rash, arthralgias, visual complaints, headache, numbness weakness  or ataxia or problems with walking or coordination,  change in mood/affect or memory.               Objective:   Physical Exam  amb  pr female nad   01/06/2014        133 > 03/03/2014  132 > 03/18/2014   134  Wt Readings from Last 3 Encounters:  11/28/13 123 lb 3.2 oz (55.883 kg)  11/28/13 123 lb 3.2 oz (55.883 kg)  11/10/13 124 lb 3.2 oz (56.337 kg)       HEENT: nl dentition, turbinates, and orophanx. Nl external ear canals without cough reflex   NECK :  without JVD/Nodes/TM/ nl carotid upstrokes bilaterally   LUNGS: no acc muscle use, clear to A and P bilaterally without cough on insp or exp maneuvers   CV:  RRR  no s3 or murmur or increase in P2, no edema   ABD:  soft and nontender with nl excursion in the supine position. No bruits or organomegaly, bowel sounds nl  MS:  warm without deformities, calf tenderness, cyanosis or clubbing  SKIN: warm and dry without lesions    NEURO:  alert, approp, no deficits      CT 03/04/14 . Progressive bilateral pulmonary interstitial prominence  suggesting active pneumonitis.  2. Calcified pulmonary nodules consistent with prior granulomatous  disease.  3. No evidence of pulmonary embolus.         Recent Labs Lab 03/03/14 1252  NA 140  K 3.8  CL 104  CO2 27  BUN 17  CREATININE 1.3*  GLUCOSE 103*    Recent Labs Lab 03/03/14 1252  HGB 13.8  HCT 43.8  WBC 26.3 Repeated and verified X2.*  PLT 291.0      Lab Results  Component Value Date   ESRSEDRATE 29* 03/03/2014   ESRSEDRATE 54* 01/06/2014   ESRSEDRATE 20 12/12/2013      Lab Results  Component Value Date   TSH 0.69 03/03/2014     Lab Results  Component Value Date   PROBNP 33.0 03/03/2014    Assessment:

## 2014-03-18 NOTE — Telephone Encounter (Signed)
Spoke with [name] MuirJamie in regards to pt request. We will f/u with pt after speaking with provider

## 2014-03-19 ENCOUNTER — Telehealth: Payer: Self-pay | Admitting: Internal Medicine

## 2014-03-19 DIAGNOSIS — G4734 Idiopathic sleep related nonobstructive alveolar hypoventilation: Secondary | ICD-10-CM

## 2014-03-19 NOTE — Telephone Encounter (Signed)
MW--  She needs an ONO on room air to qualify her for the oxygen that we are trying to restart the pt on.  Please advise if ok to order this.  thanks

## 2014-03-19 NOTE — Telephone Encounter (Signed)
Spoke with rhonda and she is aware that the sleep study has not been scanned into the pts chart yet and is not in the scan folder at this time.  [facility]

## 2014-03-19 NOTE — Telephone Encounter (Signed)
Spoke with Melissa from [facility]---she stated that when the pt was with them before she was self pay and she is now with medicare, so the order will need to be re-written to list the liter flow for the oxygen that the pt will need.  They will also need a copy of the current ONO that has been done for the pt.  MW please advise. thanks

## 2014-03-19 NOTE — Telephone Encounter (Signed)
[facility] did get your order that you send in today but bc she is now on medicare they will have to have a copy of her ono that was done recently.  Message sent back to [facility] to advise.

## 2014-03-19 NOTE — Telephone Encounter (Signed)
Already done and filed yesterday but probably not scanned in . See problem list entry" nocturnal hypoxemia "

## 2014-03-19 NOTE — Telephone Encounter (Signed)
I Sent order 02 2lpm at hs and repeat ono on this setting

## 2014-03-21 ENCOUNTER — Encounter: Payer: Self-pay | Admitting: Internal Medicine

## 2014-03-21 NOTE — Assessment & Plan Note (Addendum)
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 Progressive bilateral pulmonary interstitial prominence suggesting active pneumonitis> rec maint on 40 mg daily  - 03/05/2014 rec [facility] eval > appt for April 17 2014  - 03/19/2014   Walked RA x one lap @ 185 stopped due to  No desat, just tired   The goal with a chronic steroid dependent illness is always arriving at the lowest effective dose that controls the disease/symptoms and not accepting a set "formula" which is based on statistics or guidelines that don't always take into account patient  variability or the natural hx of the dz in every individual patient, which may well vary over time.  For now therefore I recommend the patient maintain  40 mg daily as sats with exertion are staying up on this even though ct suggests her dz is progressing so emphasized need for her to keep appt  Use of PPI is associated with improved survival time and with decreased radiologic fibrosis per King's study published in AJRCCM vol 184 p1390.  Dec 2011  This may not be cause and effect, but given how universally unhelpful all the otherstudy drugs have been for pf,   rec  max ppi / diet/ lifestyle modification.

## 2014-03-21 NOTE — Assessment & Plan Note (Addendum)
ONO RA  03/08/14 sats < 89% x continuous x 2h 14 m > 03/17/14  rec 02 2lpm and repeat study

## 2014-03-23 ENCOUNTER — Telehealth: Payer: Self-pay | Admitting: Internal Medicine

## 2014-03-23 NOTE — Telephone Encounter (Signed)
Spoke with Bjorn LoserRhonda as she was looking for this document earlier today.  ONO is not in scan folder nor is it up front in the scan bin-- looks as though this has been prepped and sent to be scanned into the patients chart. Per Bjorn Loserhonda, she is planning on contacting [facility] as she is working on this.   Please advise Rhonda. Thanks.

## 2014-03-23 NOTE — Telephone Encounter (Addendum)
Spoke with patient--states that [name] [name] is having an issue filling this Rx States that they told her a PA needed to be completed.  Per pt, [facility] faxed over letter/form. We do not have a faxed PA -------- [facility] x[facility] at [facility] [phone] Called [phone]-- requested Pharmacy for PA PA# [id number] 24 hr time period for approval process.   Pt aware Will hold in triage to call back tomorrow to check on progress

## 2014-03-23 NOTE — Telephone Encounter (Signed)
Lyrica PA is being handled in another message. (dated 03/23/14)

## 2014-03-23 NOTE — Telephone Encounter (Signed)
Delivered O2 based on snap shot for ONO, but need copy of actual ONO report not scanned in yet.  Please fax to [phone] Attn:  Sheron NightingaleJason Pierce.  Melissa can be reached at [phone].  [name]

## 2014-03-23 NOTE — Telephone Encounter (Signed)
Will forward message back to Triage to address Lyrica prior authorization. Rhonda J Cobb

## 2014-03-23 NOTE — Telephone Encounter (Signed)
Contacted Kim S at [facility] (DME who preformed the ONO) to fax copy of ONO to [phone] or [phone]. Report placed in Melissa's green folder for [facility]. Rhonda J Cobb Nothing else needed at this time. Rhonda J Cobb

## 2014-03-25 MED ORDER — GABAPENTIN 300 MG PO CAPS: 300.0000 mg | ORAL_CAPSULE | Freq: Two times a day (BID) | ORAL | Status: DC

## 2014-03-25 NOTE — Telephone Encounter (Signed)
Spoke with Toniann FailWendy at [facility] and PA for Lyrica was denied because pt has not tried at least 2 of the preferred drugs.  The drugs are:  Clonazepam. Gabapentin, Lomotrigen, Keppra, Tpoiramate, Zonisamide, Gabitril, Diastat.  She is on Keppra but she needs to have tried at least 2 drugs.  Please advise.

## 2014-03-25 NOTE — Telephone Encounter (Signed)
Ok

## 2014-03-25 NOTE — Telephone Encounter (Signed)
MW prescribed this for pt at ov on 03/18/14.  IT looks like she was increased from 100mg  to 300mg .  Rx was printed and given to pt.  Please advise.

## 2014-03-25 NOTE — Telephone Encounter (Signed)
It wasn't lyrica it was neurontin that I rec and it's listed as an alternative so don't see the problem

## 2014-03-25 NOTE — Telephone Encounter (Signed)
This isn't a med we prescribe here anyway, so need to see whoever gave it to her

## 2014-03-25 NOTE — Telephone Encounter (Signed)
Pt is checking on status today [phone].  [name] [name]

## 2014-03-25 NOTE — Telephone Encounter (Signed)
So its ok to send in Neurontin  300mg  bid?

## 2014-03-25 NOTE — Telephone Encounter (Signed)
Spoke with pt and advised that rx for Neurontin was sent to pharmacy to replace Lyrica.

## 2014-03-27 ENCOUNTER — Telehealth: Payer: Self-pay | Admitting: Internal Medicine

## 2014-03-27 NOTE — Telephone Encounter (Signed)
Per order 03/19/14; 02 2 2lpm at hs and repeat ono on 2lpm --  I called spoke with pt. Made her aware of above. Nothing further needed

## 2014-03-30 ENCOUNTER — Telehealth: Payer: Self-pay | Admitting: Internal Medicine

## 2014-03-30 NOTE — Telephone Encounter (Signed)
I don't know that nocturnal hypoxemia justifies per insurance rules the need for a hospital bed so if she wants to purchase one she may be left paying the whole bill.

## 2014-03-30 NOTE — Telephone Encounter (Signed)
Leave it off then and will need to see her this week if not better in a day or two, list as allergy on record but if it continues off the neurontin it may not be the neurontin

## 2014-03-30 NOTE — Telephone Encounter (Signed)
Called, spoke with pt -  She started the neurontin 300 mg bid on Friday.  On Saturday, she noticed red spots all over body and face.  Rash did not itch. Took benadryl 50 mg - rash cleared.  She continued to take the neurontin, on Sunday she had a rash again and started having a difficult time swallowing.  She took benadryl again with relieved the difficulty swallowing.  The rash has improved.  Is slightly still present but not as red.  Pt has not taken anymore doses.  Advised to hold doses at this time until instructed otherwise.  Dr. Sherene SiresWert, pls advise.  Thank you.

## 2014-03-30 NOTE — Telephone Encounter (Signed)
I called made pt aware of recs. Nothing further needed 

## 2014-03-30 NOTE — Telephone Encounter (Signed)
[facility] order received for hospital bed via fax. Please order through [facility] There is nothing documented in chart where Dr Sherene SiresWert has specified for pt to have a bed nor is there a rx in scan folder that would have been faxed to [facility] to request this.  Please advise Dr Sherene SiresWert if you are wanting to order a hospital bed. Thanks.

## 2014-03-31 NOTE — Telephone Encounter (Signed)
i called and made pt aware of below. She voiced her understanding. Nothing further needed

## 2014-04-02 ENCOUNTER — Ambulatory Visit: Payer: Medicaid Other | Admitting: Internal Medicine

## 2014-04-16 ENCOUNTER — Encounter: Payer: Self-pay | Admitting: Internal Medicine

## 2014-04-22 ENCOUNTER — Ambulatory Visit: Payer: Medicaid Other | Admitting: Internal Medicine

## 2014-04-23 ENCOUNTER — Other Ambulatory Visit: Payer: Self-pay | Admitting: *Deleted

## 2014-04-23 DIAGNOSIS — R569 Unspecified convulsions: Secondary | ICD-10-CM

## 2014-04-23 NOTE — Telephone Encounter (Signed)
Patient has brought in notes from her other provider visits. Notes placed in Cletus GashV. Keck, NP box. Annamaria Hellingose,Haydee Jabbour Renee, RN

## 2014-04-24 ENCOUNTER — Telehealth: Payer: Self-pay | Admitting: Internal Medicine

## 2014-04-24 ENCOUNTER — Other Ambulatory Visit: Payer: Self-pay | Admitting: Internal Medicine

## 2014-04-24 DIAGNOSIS — M797 Fibromyalgia: Secondary | ICD-10-CM

## 2014-04-24 MED ORDER — LEVETIRACETAM 500 MG PO TABS: 500.0000 mg | ORAL_TABLET | Freq: Two times a day (BID) | ORAL | Status: DC

## 2014-04-24 MED ORDER — PANTOPRAZOLE SODIUM 40 MG PO TBEC: 40.0000 mg | DELAYED_RELEASE_TABLET | Freq: Two times a day (BID) | ORAL | Status: DC

## 2014-04-24 NOTE — Telephone Encounter (Signed)
Pt. Calling to request medication refills. Please f/u with pt.

## 2014-04-24 NOTE — Telephone Encounter (Signed)
MW do you want to fill just the tramadol?   The tramadol and fentanyl patches were last filled by you on 03-18-14.  thanks

## 2014-04-24 NOTE — Telephone Encounter (Signed)
Pt is calling requesting refills on the following meds:  Tramadol 50 mg   Last filled on 03/18/14 for #180 Vit d 50K  Does not look like MW has ever filled this Fentanyl  Last filled on 03/18/14 for 15 patches   Please advise on refills for this pt. Thanks Last OV with MW was 03/18/14  Allergies  Allergen Reactions  . Shellfish Allergy Anaphylaxis  . Gabapentin     rash    Current Outpatient Prescriptions on File Prior to Visit  Medication Sig Dispense Refill  . albuterol (PROVENTIL HFA;VENTOLIN HFA) 108 (90 BASE) MCG/ACT inhaler Inhale 2 puffs into the lungs every 6 (six) hours as needed for wheezing or shortness of breath.  1 Inhaler  2  . Cyanocobalamin (B-12) 2000 MCG TABS Take 2,000 mcg by mouth daily.  30 tablet  12  . etodolac (LODINE) 500 MG tablet Take 500 mg by mouth 2 (two) times daily.      . fentaNYL (DURAGESIC - DOSED MCG/HR) 50 MCG/HR Place 1 patch (50 mcg total) onto the skin every 3 (three) days.  15 patch  0  . folic acid (FOLVITE) 1 MG tablet Take 1 tablet daily.  30 tablet  0  . gabapentin (NEURONTIN) 300 MG capsule Take 1 capsule (300 mg total) by mouth 2 (two) times daily.  60 capsule  6  . levETIRAcetam (KEPPRA) 500 MG tablet Take 1 tablet (500 mg total) by mouth 2 (two) times daily.  30 tablet  1  . pantoprazole (PROTONIX) 40 MG tablet Take 1 tablet (40 mg total) by mouth 2 (two) times daily.  60 tablet  6  . polyethylene glycol (MIRALAX / GLYCOLAX) packet Take 17 g by mouth daily as needed for mild constipation or moderate constipation.  14 each  0  . predniSONE (DELTASONE) 20 MG tablet Take 2 tablets (40 mg total) by mouth daily with breakfast.  60 tablet  5  . rizatriptan (MAXALT) 10 MG tablet Take 10 mg by mouth daily as needed for migraine. May repeat in 2 hours if needed      . traMADol (ULTRAM) 50 MG tablet Take 1-2 tablets (50-100 mg total) by mouth every 4 (four) hours as needed (cough).  180 tablet  0  . Vitamin D, Ergocalciferol, (DRISDOL) 50000 UNITS CAPS  capsule Take 50,000 Units by mouth every 7 (seven) days. Take on Saturday       No current facility-administered medications on file prior to visit.

## 2014-04-24 NOTE — Telephone Encounter (Signed)
Ok x 90 only -   whoever has been filling the fentanyl need to refill the tramadol as they interact

## 2014-04-25 NOTE — Telephone Encounter (Signed)
These are not for a lung problem but if she has no other doctor have her see Tammy NP and sort out who will do it and give her meds to last till then

## 2014-04-27 ENCOUNTER — Telehealth: Payer: Self-pay | Admitting: *Deleted

## 2014-04-27 MED ORDER — FENTANYL 50 MCG/HR TD PT72: 50.0000 ug | MEDICATED_PATCH | TRANSDERMAL | Status: DC

## 2014-04-27 NOTE — Telephone Encounter (Signed)
Patient states she [place]. [place].

## 2014-04-27 NOTE — Telephone Encounter (Signed)
Pt states this will only be this time.  Pt can be reached at [phone].  [name] [name]

## 2014-04-27 NOTE — Telephone Encounter (Signed)
Message copied by Fredderick SeveranceUCATTE, LAURENZE L on Mon Apr 27, 2014  9:21 AM ------      Message from: Holland CommonsKECK, VALERIE A      Created: Fri Apr 24, 2014  6:39 PM       Please call patient and let her no I have received her notes from her past pain clinic and I have placed a referral for pain clinic  ------

## 2014-04-27 NOTE — Telephone Encounter (Signed)
rx has been signed by MW and i have called the pt and she is aware that rx has been left up front and she will come by and pick this up.

## 2014-04-27 NOTE — Telephone Encounter (Signed)
That's fine

## 2014-04-27 NOTE — Telephone Encounter (Signed)
I do not see where we called pt again. Will forward back to MW to advise on the Fentanyl patches to last until her 8.17.15 ov w/ pain clinic.

## 2014-04-27 NOTE — Telephone Encounter (Signed)
rx for the Fentanyl patches has been printed out for #5 patches.  Given to MW to sign and will call pt once this is ready to be picked up by the pt.

## 2014-04-27 NOTE — Telephone Encounter (Signed)
Called spoke with patient to discuss her medication refills.  Advised that as these are not pulmonary medications, Dr Sherene SiresWert is unable to continue filling them and recommended that she contact her PCP.  Per pt she has already done this (there is a phone note in epic dated 8.7.15 to her PCP Holland CommonsValerie Keck NP) and was told that PCP is unable to fill any narcotics.  Pt stated that she is scheduled for consult with [place] on 8[place]17.15 and is asking for enough Fentanyl until this appt (she stated that she has enough Tramadol and Vitamin D at this time and does not need refills on these).  Advised pt will discuss with MW and let her know.  She has no more Fentanyl left and reportedly was to remove her last patch yesterday but has not done so d/t no more patches.  Dr Sherene SiresWert, pt is also scheduled to see you tomorrow 8.11.15 to [facility] and the findings/plan from said appt.  She does not want to wait until tomorrow to receive rx.  Please advise, thank you.

## 2014-04-28 ENCOUNTER — Ambulatory Visit (INDEPENDENT_AMBULATORY_CARE_PROVIDER_SITE_OTHER): Payer: Medicaid Other | Admitting: Internal Medicine

## 2014-04-28 ENCOUNTER — Encounter (INDEPENDENT_AMBULATORY_CARE_PROVIDER_SITE_OTHER): Payer: Self-pay

## 2014-04-28 ENCOUNTER — Encounter: Payer: Self-pay | Admitting: Internal Medicine

## 2014-04-28 VITALS — BP 136/74 | HR 108 | Temp 98.1°F | Ht 61.0 in | Wt 141.0 lb

## 2014-04-28 DIAGNOSIS — R51 Headache: Secondary | ICD-10-CM

## 2014-04-28 DIAGNOSIS — G40909 Epilepsy, unspecified, not intractable, without status epilepticus: Secondary | ICD-10-CM

## 2014-04-28 DIAGNOSIS — J8489 Other specified interstitial pulmonary diseases: Secondary | ICD-10-CM

## 2014-04-28 DIAGNOSIS — R519 Headache, unspecified: Secondary | ICD-10-CM | POA: Insufficient documentation

## 2014-04-28 DIAGNOSIS — M797 Fibromyalgia: Secondary | ICD-10-CM

## 2014-04-28 DIAGNOSIS — R569 Unspecified convulsions: Secondary | ICD-10-CM

## 2014-04-28 DIAGNOSIS — J8409 Other alveolar and parieto-alveolar conditions: Secondary | ICD-10-CM

## 2014-04-28 DIAGNOSIS — IMO0001 Reserved for inherently not codable concepts without codable children: Secondary | ICD-10-CM

## 2014-04-28 DIAGNOSIS — G4734 Idiopathic sleep related nonobstructive alveolar hypoventilation: Secondary | ICD-10-CM

## 2014-04-28 MED ORDER — LEVETIRACETAM 500 MG PO TABS: 500.0000 mg | ORAL_TABLET | Freq: Two times a day (BID) | ORAL | Status: DC

## 2014-04-28 MED ORDER — TRAMADOL HCL 50 MG PO TABS
50.0000 mg | ORAL_TABLET | ORAL | Status: AC | PRN
Start: 2014-04-28 — End: ?

## 2014-04-28 MED ORDER — VITAMIN D (ERGOCALCIFEROL) 1.25 MG (50000 UNIT) PO CAPS: 50000.0000 [IU] | ORAL_CAPSULE | ORAL | Status: DC

## 2014-04-28 MED ORDER — GABAPENTIN 300 MG PO CAPS: 300.0000 mg | ORAL_CAPSULE | Freq: Two times a day (BID) | ORAL | Status: DC

## 2014-04-28 MED ORDER — B-12 2000 MCG PO TABS: 2000.0000 ug | ORAL_TABLET | Freq: Every day | ORAL | Status: DC

## 2014-04-28 MED ORDER — ETODOLAC 500 MG PO TABS: 500.0000 mg | ORAL_TABLET | Freq: Two times a day (BID) | ORAL | Status: DC

## 2014-04-28 MED ORDER — FENTANYL 50 MCG/HR TD PT72: 50.0000 ug | MEDICATED_PATCH | TRANSDERMAL | Status: AC

## 2014-04-28 NOTE — Patient Instructions (Addendum)
Please see patient coordinator before you leave today  to schedule to neurology appt for migraines and track your overnight 02 results  Be sure to keep appt to Pain management in 2 weeks and we will refill the medicaitons you've been on in meantime  [facility]

## 2014-04-28 NOTE — Progress Notes (Signed)
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 11/21/13  confirmed by [facility]> on pred daily since  Date of Admission: 11/18/2013   Date of Discharge: 11/28/2013  Attending Physician: Dominic Pea, DO  Discharge Diagnosis:  Pneumonitis responsive to steroids,  BOOP per biopsy   Pleuritic Chest Pain  History of Grade 1 Diastolic CHF  Clostrium difficile infection  Symptomatic UTI  Hypokalemia and hypomagnesia  Migraines  Seizure Disorder  Fibromyalgia  Acute on chronic epigastric pain  Chronic Normocytic Anemia  Severe Malnutrition in setting of hypoalbuminemia      12/12/2013 f/u ov/[name] [name] re:  Chief Complaint  Patient presents with  . HFU    Pt reports her breathing is much improved, but not back to her normal baseline.  She c/o tightness in chest for the past 3 wks.      Lab Results  Component Value Date   ESRSEDRATE 93* 11/02/2013    On 40 mg per day at ov and no cough or sob at rest, still feels a little tight with deep breath, esp on L at bx site rec Reduce prednisone to 20 mg daily based on clear cxr and esr =20   01/06/2014 f/u ov/[name] [name] re: BOOP Chief Complaint  Patient presents with  . Follow-up    CXR done today--Increased sob, wheezing and tightness in chest and some cough worsening x5 days  was doing ok until first week in April 2015 more sob even sitting still / more chest  tight and more dry cough  saba no change dep, increased prednisone to 40 mg since around 01/02/14 > no better rec For cough try desym 2 tsp every 12 hours as needed GERD diet  Recheck esr = 54 so increase pred to 40 mg daily    CTa 01/08/14 1. No embolus or aortic dissection.  2. Persistent mosaic attenuation in the lungs but without the  confluent ground-glass opacities,  suggesting an improvement in the  organizing pneumonia.    03/03/2014 f/u ov/[name] [name] re: f/u boop on pred 40 mg daily  Chief Complaint  Patient presents with  . Acute Visit    Pt c/o increased SOB for the past month- gets SOB with or w/o exertion.     rec CT chest > worse fibrosis Lab Results  Component Value Date   ESRSEDRATE 29* 03/03/2014   rec maint on 40 mg daily    03/19/2014 f/u ov/[name] [name] re:  Chief Complaint  Patient presents with  . Follow-up    Pt here to review ONO and CT Chest results. She reports having increased SOB since her last visit.  She also c/o CP "sometimes sharp" comes and goes x 2 days. She is using rescue inhaler 1 to 2 times per day  cps are fleeting migratory no pleuritic or exertional rec Please see patient coordinator before you leave today  to schedule resume 02 at previous level and repeat the ono on that amount of 02 to be sure it's adequate For now stay on Prednisone at 40 mg daily  Protonix 40 mg Take 30- 60 min before your first and last meals of the day  Only use the lodine as needed for joint stiffness  Keep your appts to [facility] and the pain clinic and return  here as needed       04/28/2014 f/u ov/[name] [name] re: refill pain meds until seen in Pain clinic Chief Complaint  Patient presents with  . Follow-up    Pt states that her breathing has improved. No new co's today.    rec from [facility] was stay on 40 mg prednisone per day for now and breathing some better    No obvious day to day or daytime variabilty or assoc chronic cough or cp or chest tightness, subjective wheeze overt sinus or hb symptoms. No unusual exp hx or h/o childhood pna/ asthma or knowledge of premature birth.  Sleeping ok without nocturnal  or early am exacerbation  of respiratory  c/o's or need for noct saba. Also denies any obvious fluctuation of symptoms with weather or environmental changes or other aggravating or alleviating factors except as outlined above   Current Medications,  Allergies, Complete Past Medical History, Past Surgical History, Family History, and Social History were reviewed in [employer] record.  ROS  The following are not active complaints unless bolded sore throat, dysphagia, dental problems, itching, sneezing,  nasal congestion or excess/ purulent secretions, ear ache,   fever, chills, sweats, unintended wt loss, pleuritic or exertional cp, hemoptysis,  orthopnea pnd or leg swelling, presyncope, palpitations, heartburn, abdominal pain, anorexia, nausea, vomiting, diarrhea  or change in bowel or urinary habits, change in stools or urine, dysuria,hematuria,  rash, arthralgias, visual complaints, headache, numbness weakness or ataxia or problems with walking or coordination,  change in mood/affect or memory.               Objective:   Physical Exam  amb  pr female nad mod cushingnoid   01/06/2014        133 > 03/03/2014  132 > 03/18/2014   134 > 04/28/14 141  Wt Readings from Last 3 Encounters:  11/28/13 123 lb 3.2 oz (55.883 kg)  11/28/13 123 lb 3.2 oz (55.883 kg)  11/10/13 124 lb 3.2 oz (56.337 kg)       HEENT: nl dentition, turbinates, and orophanx. Nl external ear canals without cough reflex   NECK :  without JVD/Nodes/TM/ nl carotid upstrokes bilaterally   LUNGS: no acc muscle use, clear to A and P bilaterally without cough on insp or exp maneuvers   CV:  RRR  no s3 or murmur or increase in P2, no edema   ABD:  soft and nontender with nl excursion in the supine position. No bruits or organomegaly, bowel sounds nl  MS:  warm without deformities, calf tenderness, cyanosis or clubbing  SKIN: warm and dry without lesions    NEURO:  alert, approp, no deficits      CT 03/04/14 . Progressive bilateral pulmonary interstitial prominence  suggesting active pneumonitis.  2. Calcified pulmonary nodules consistent with prior granulomatous  disease.  3. No evidence of pulmonary embolus.         Recent  Labs Lab 03/03/14 1252  NA 140  K 3.8  CL 104  CO2 27  BUN 17  CREATININE 1.3*  GLUCOSE 103*    Recent Labs Lab 03/03/14 1252  HGB 13.8  HCT 43.8  WBC 26.3 Repeated and verified X2.*  PLT 291.0      Lab Results  Component Value Date   ESRSEDRATE 29* 03/03/2014   ESRSEDRATE 54* 01/06/2014   ESRSEDRATE 20 12/12/2013      Lab Results  Component Value Date   TSH 0.69 03/03/2014     Lab  Results  Component Value Date   PROBNP 33.0 03/03/2014    Assessment:

## 2014-04-29 ENCOUNTER — Encounter: Payer: Self-pay | Admitting: Internal Medicine

## 2014-04-29 ENCOUNTER — Ambulatory Visit: Payer: Medicaid Other | Attending: Internal Medicine | Admitting: Internal Medicine

## 2014-04-29 VITALS — BP 115/79 | HR 96 | Temp 99.4°F | Resp 20 | Ht 61.0 in | Wt 140.8 lb

## 2014-04-29 DIAGNOSIS — J8489 Other specified interstitial pulmonary diseases: Secondary | ICD-10-CM

## 2014-04-29 DIAGNOSIS — J8410 Pulmonary fibrosis, unspecified: Secondary | ICD-10-CM | POA: Insufficient documentation

## 2014-04-29 DIAGNOSIS — J8409 Other alveolar and parieto-alveolar conditions: Secondary | ICD-10-CM | POA: Insufficient documentation

## 2014-04-29 DIAGNOSIS — IMO0001 Reserved for inherently not codable concepts without codable children: Secondary | ICD-10-CM | POA: Diagnosis not present

## 2014-04-29 DIAGNOSIS — M797 Fibromyalgia: Secondary | ICD-10-CM

## 2014-04-29 DIAGNOSIS — Z87891 Personal history of nicotine dependence: Secondary | ICD-10-CM | POA: Diagnosis not present

## 2014-04-29 DIAGNOSIS — R51 Headache: Secondary | ICD-10-CM | POA: Diagnosis not present

## 2014-04-29 NOTE — Progress Notes (Signed)
Patient ID: [name] [name], female   DOB: 06/21/1961, 53 y.o.   MRN: [id number]  CC:  F/u  HPI:  Patient presents today for a f/u.  She states that [facility]  She is waiting on the lung biopsy to see what the next steps of treatment.  She is currently on "medications" to slow down the progression of the disease.  Unfortunatly she is unable to remember the name of the medication. She reports that she was never diagnosed with lung disease in [name] and was only being seen by pain management there.   She reports that her pulmonologist prescribed her 3 liters of home oxygen.  She reports that she needs portable oxygen to use while out at doctors appointments and shopping.  She reports that she went to [place] pul[facility] yesterday to get a prescription for fentanyl patch.  She states that she has a pain clinic appointment on the 24th of this month.           Allergies  Allergen Reactions  . Shellfish Allergy Anaphylaxis  . Gabapentin     rash   Past Medical History  Diagnosis Date  . Fibromyalgia   . Chronic pain   . Seizures   . Migraine   . Pneumonia    Current Outpatient Prescriptions on File Prior to Visit  Medication Sig Dispense Refill  . albuterol (PROVENTIL HFA;VENTOLIN HFA) 108 (90 BASE) MCG/ACT inhaler Inhale 2 puffs into the lungs every 6 (six) hours as needed for wheezing or shortness of breath.  1 Inhaler  2  . Cyanocobalamin (B-12) 2000 MCG TABS Take 2,000 mcg by mouth daily.  15 tablet  0  . etodolac (LODINE) 500 MG tablet Take 1 tablet (500 mg total) by mouth 2 (two) times daily.  30 tablet  0  . fentaNYL (DURAGESIC - DOSED MCG/HR) 50 MCG/HR Place 1 patch (50 mcg total) onto the skin every 3 (three) days.  5 patch  0  . folic acid (FOLVITE) 1 MG tablet Take 1 tablet daily.  30 tablet  0  . levETIRAcetam (KEPPRA) 500 MG tablet Take 1 tablet (500 mg total) by mouth 2 (two) times daily.  28 tablet  0  . pantoprazole (PROTONIX) 40  MG tablet Take 1 tablet (40 mg total) by mouth 2 (two) times daily.  60 tablet  6  . polyethylene glycol (MIRALAX / GLYCOLAX) packet Take 17 g by mouth daily as needed for mild constipation or moderate constipation.  14 each  0  . predniSONE (DELTASONE) 20 MG tablet Take 2 tablets (40 mg total) by mouth daily with breakfast.  60 tablet  5  . rizatriptan (MAXALT) 10 MG tablet Take 10 mg by mouth daily as needed for migraine. May repeat in 2 hours if needed      . traMADol (ULTRAM) 50 MG tablet Take 1-2 tablets (50-100 mg total) by mouth every 4 (four) hours as needed (cough).  84 tablet  0  . gabapentin (NEURONTIN) 300 MG capsule Take 1 capsule (300 mg total) by mouth 2 (two) times daily.  28 capsule  0  . Vitamin D, Ergocalciferol, (DRISDOL) 50000 UNITS CAPS capsule Take 1 capsule (50,000 Units total) by mouth every 7 (seven) days. Take on Saturday  3 capsule  0   No current facility-administered medications on file prior to visit.   Family History  Problem Relation Age of Onset  . Hypertension Mother   . Diabetes Father   .  Hypertension Father    History   Social History  . Marital Status: Single    Spouse Name: N/A    Number of Children: N/A  . Years of Education: N/A   Occupational History  . Not on file.   Social History Main Topics  . Smoking status: Former Smoker -- 1.00 packs/day for 10 years    Types: Cigarettes    Quit date: 09/18/2004  . Smokeless tobacco: Never Used  . Alcohol Use: No  . Drug Use: No  . Sexual Activity: No   Other Topics Concern  . Not on file   Social History Narrative  . No narrative on file   Review of Systems  Unable to perform ROS Respiratory: Positive for shortness of breath. Negative for wheezing.   Cardiovascular: Negative.   Gastrointestinal: Negative.   Neurological: Positive for headaches. Negative for dizziness.  Psychiatric/Behavioral: Positive for memory loss.      Objective:   Filed Vitals:   04/29/14 0918  BP: 115/79   Pulse: 96  Temp: 99.4 F (37.4 C)  Resp: 20   Physical Exam  Constitutional: She is oriented to person, place, and time.  Neck: Normal range of motion.  Cardiovascular: Normal rate, regular rhythm and normal heart sounds.   Pulmonary/Chest: No respiratory distress.  Diminished breath sounds Exertional dyspnea  Abdominal: Soft. Bowel sounds are normal.  Musculoskeletal:  Walker for ambulation   Neurological: She is alert and oriented to person, place, and time.     Lab Results  Component Value Date   WBC 26.3 Repeated and verified X2.* 03/03/2014   HGB 13.8 03/03/2014   HCT 43.8 03/03/2014   MCV 76.6* 03/03/2014   PLT 291.0 03/03/2014   Lab Results  Component Value Date   CREATININE 0.86 03/06/2014   BUN 15 03/06/2014   NA 141 03/06/2014   K 4.0 03/06/2014   CL 102 03/06/2014   CO2 26 03/06/2014    No results found for this basename: HGBA1C   Lipid Panel  No results found for this basename: chol, trig, hdl, cholhdl, vldl, ldlcalc       Assessment and plan:   [name] [name] was seen today for follow-up.  Diagnoses and associated orders for this visit:  BOOP (bronchiolitis obliterans with organizing pneumonia) Rx written for portable oxygen 2-3 liters/min.   Headache(784.0) - Ambulatory referral to Neurology  Fibromyalgia Will see pain management later this month  Return in about 3 months (around 07/30/2014).       Holland Commons, NP-C [facility] [phone] 04/29/2014, 9:38 AM

## 2014-04-29 NOTE — Progress Notes (Signed)
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 Uses oxygen at home. [facility] Has appt with pain management for chronic generalized pain Requesting refills and portable oxygen. States she's been taking lyrica from Dr. in [name]

## 2014-04-30 ENCOUNTER — Encounter: Payer: Self-pay | Admitting: Internal Medicine

## 2014-04-30 NOTE — Assessment & Plan Note (Signed)
Referred to Neurology 

## 2014-04-30 NOTE — Assessment & Plan Note (Signed)
Since high school, w/o seizures for decades since teens>  Referred to neurology

## 2014-04-30 NOTE — Assessment & Plan Note (Addendum)
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 Progressive bilateral pulmonary interstitial prominence suggesting active pneumonitis> rec maint on 40 mg daily  - 03/05/2014 rec [facility] eval > appt for April 17 2014  - 03/19/2014   Walked RA x one lap @ 185 stopped due to  No desat, just tired   [facility] for now

## 2014-04-30 NOTE — Assessment & Plan Note (Signed)
ONO RA  03/08/14 sats < 89% x continuous x 2h 14 m > 03/17/14  rec 02 2lpm and repeat study> requested again 04/28/14

## 2014-04-30 NOTE — Assessment & Plan Note (Signed)
Refilled all pain meds pending establish with pain clinic w/in 30 days

## 2014-05-05 ENCOUNTER — Telehealth: Payer: Self-pay | Admitting: Internal Medicine

## 2014-05-05 NOTE — Telephone Encounter (Signed)
Pt. Calling to check on the status of paperwork to be able to get Oxygen from [facility]. Pt. States that form was faxed to us to be completed by MD but [facility] has not received anything back. Please f/u with pt.

## 2014-05-06 NOTE — Telephone Encounter (Signed)
Pt calling to f/u on paperwork that was sent for Oxygen. Pls contact pt.

## 2014-05-07 ENCOUNTER — Telehealth: Payer: Self-pay | Admitting: Internal Medicine

## 2014-05-07 NOTE — Telephone Encounter (Signed)
Pt. Is calling about her paper work needed for oxygen from [facility] Pt. States that she was informed that the documents that she needs must be filled out by her Pulmonologist Dr. Scarlette Calicourheim and not her PCP, pt. Would like paperwork faxed back to  [facility] at [phone] ATTN: Sharyl NimrodMeredith. Please f/u with pt.

## 2014-05-18 ENCOUNTER — Encounter: Payer: Self-pay | Admitting: Neurology

## 2014-05-18 ENCOUNTER — Ambulatory Visit (INDEPENDENT_AMBULATORY_CARE_PROVIDER_SITE_OTHER): Payer: Medicaid Other | Admitting: Neurology

## 2014-05-18 VITALS — BP 127/87 | HR 106 | Ht 61.0 in | Wt 146.0 lb

## 2014-05-18 DIAGNOSIS — M797 Fibromyalgia: Secondary | ICD-10-CM

## 2014-05-18 DIAGNOSIS — G40909 Epilepsy, unspecified, not intractable, without status epilepticus: Secondary | ICD-10-CM

## 2014-05-18 DIAGNOSIS — IMO0001 Reserved for inherently not codable concepts without codable children: Secondary | ICD-10-CM

## 2014-05-18 DIAGNOSIS — G43019 Migraine without aura, intractable, without status migrainosus: Secondary | ICD-10-CM

## 2014-05-18 HISTORY — DX: Migraine without aura, intractable, without status migrainosus: G43.019

## 2014-05-18 MED ORDER — TOPIRAMATE 25 MG PO TABS: ORAL_TABLET | ORAL | Status: DC

## 2014-05-18 NOTE — Patient Instructions (Addendum)
Topamax (topiramate) is a seizure medication that has an FDA approval for seizures and for migraine headache. Potential side effects of this medication include weight loss, cognitive slowing, tingling in the fingers and toes, and carbonated drinks will taste bad. If any significant side effects are noted on this drug, please contact our office.  Migraine Headache A migraine headache is an intense, throbbing pain on one or both sides of your head. A migraine can last for 30 minutes to several hours. CAUSES  The exact cause of a migraine headache is not always known. However, a migraine may be caused when nerves in the brain become irritated and release chemicals that cause inflammation. This causes pain. Certain things may also trigger migraines, such as:  Alcohol.  Smoking.  Stress.  Menstruation.  Aged cheeses.  Foods or drinks that contain nitrates, glutamate, aspartame, or tyramine.  Lack of sleep.  Chocolate.  Caffeine.  Hunger.  Physical exertion.  Fatigue.  Medicines used to treat chest pain (nitroglycerine), birth control pills, estrogen, and some blood pressure medicines. SIGNS AND SYMPTOMS  Pain on one or both sides of your head.  Pulsating or throbbing pain.  Severe pain that prevents daily activities.  Pain that is aggravated by any physical activity.  Nausea, vomiting, or both.  Dizziness.  Pain with exposure to bright lights, loud noises, or activity.  General sensitivity to bright lights, loud noises, or smells. Before you get a migraine, you may get warning signs that a migraine is coming (aura). An aura may include:  Seeing flashing lights.  Seeing bright spots, halos, or zigzag lines.  Having tunnel vision or blurred vision.  Having feelings of numbness or tingling.  Having trouble talking.  Having muscle weakness. DIAGNOSIS  A migraine headache is often diagnosed based on:  Symptoms.  Physical exam.  A CT scan or MRI of your  head. These imaging tests cannot diagnose migraines, but they can help rule out other causes of headaches. TREATMENT Medicines may be given for pain and nausea. Medicines can also be given to help prevent recurrent migraines.  HOME CARE INSTRUCTIONS  Only take over-the-counter or prescription medicines for pain or discomfort as directed by your health care provider. The use of long-term narcotics is not recommended.  Lie down in a dark, quiet room when you have a migraine.  Keep a journal to find out what may trigger your migraine headaches. For example, write down:  What you eat and drink.  How much sleep you get.  Any change to your diet or medicines.  Limit alcohol consumption.  Quit smoking if you smoke.  Get 7-9 hours of sleep, or as recommended by your health care provider.  Limit stress.  Keep lights dim if bright lights bother you and make your migraines worse. SEEK IMMEDIATE MEDICAL CARE IF:   Your migraine becomes severe.  You have a fever.  You have a stiff neck.  You have vision loss.  You have muscular weakness or loss of muscle control.  You start losing your balance or have trouble walking.  You feel faint or pass out.  You have severe symptoms that are different from your first symptoms. MAKE SURE YOU:   Understand these instructions.  Will watch your condition.  Will get help right away if you are not doing well or get worse. Document Released: 09/04/2005 Document Revised: 01/19/2014 Document Reviewed: 05/12/2013 Exit[employer] Patient Information 2015 [employer]. This information is not intended to replace advice given to you by your health   care provider. Make sure you discuss any questions you have with your health care provider.  

## 2014-05-18 NOTE — Progress Notes (Signed)
Reason for visit: Migraine headache  [name] [name] is a 53 y.o. female  History of present illness:  [name] [name] is a 53 year old right-handed Hispanic female with a history of migraine headaches since she was in high school. The patient indicates that usually she would have on average one headache a week, but she has been under stress recently after she was given the diagnosis of pulmonary fibrosis in March of 2015. Since that time, the headaches have become more frequent, occurring 3 or 4 times a week. The patient has taken Maxalt for headaches with some benefit, and she indicates that the duration of the headache is about 3-4 hours with the Maxalt. The patient indicates that the headaches sometimes will begin on one side or the other and then generalize, but usually the headaches begin throughout the head, associated with a pressure and a throbbing sensation. The patient will have some nausea and vomiting at times, and she will have some blurring of vision with occasional loss of vision. She reports that she has significant phonophobia and photophobia. Alcohol, chocolate, weather changes, and perfumes will bring on the headache. She has undergone MRI evaluation of the brain in February 2015 showing a chronic left parietal white matter change. The patient has a history of seizures again since high school, and she has been on various medications such as Dilantin, Trileptal, and more recently Keppra for the seizures. The last seizure event was 15 years ago. The patient has been using a walker since the diagnosis of pulmonary fibrosis. She is sent to this office for an evaluation.  Past Medical History  Diagnosis Date  . Fibromyalgia   . Chronic pain   . Seizures   . Migraine   . Pneumonia   . Pulmonary fibrosis   . Migraine without aura, with intractable migraine, so stated, without mention of status migrainosus 05/18/2014    Past Surgical History  Procedure Laterality Date  . Video  bronchoscopy N/A 11/21/2013    Procedure: VIDEO BRONCHOSCOPY;  Surgeon: Delight Ovens, MD;  Location: [facility] OR;  Service: Thoracic;  Laterality: N/A;  . Video assisted thoracoscopy Left 11/21/2013    Procedure: VIDEO ASSISTED THORACOSCOPY;  Surgeon: Delight Ovens, MD;  Location: [facility] OR;  Service: Thoracic;  Laterality: Left;  . Lung biopsy Left 11/21/2013    Procedure: LUNG BIOPSY;  Surgeon: Delight Ovens, MD;  Location: [facility] OR;  Service: Thoracic;  Laterality: Left;  . Abdominal hysterectomy      Family History  Problem Relation Age of Onset  . Hypertension Mother   . Hepatitis C Mother   . Diabetes Father   . Hypertension Father   . Bipolar disorder Brother   . Multiple sclerosis Sister     Social history:  reports that she quit smoking about 9 years ago. Her smoking use included Cigarettes. She has a 10 pack-year smoking history. She has never used smokeless tobacco. She reports that she does not drink alcohol or use illicit drugs.  Medications:  Current Outpatient Prescriptions on File Prior to Visit  Medication Sig Dispense Refill  . albuterol (PROVENTIL HFA;VENTOLIN HFA) 108 (90 BASE) MCG/ACT inhaler Inhale 2 puffs into the lungs every 6 (six) hours as needed for wheezing or shortness of breath.  1 Inhaler  2  . Cyanocobalamin (B-12) 2000 MCG TABS Take 2,000 mcg by mouth daily.  15 tablet  0  . etodolac (LODINE) 500 MG tablet Take 1 tablet (500 mg total) by mouth 2 (two) times daily.  30 tablet  0  . fentaNYL (DURAGESIC - DOSED MCG/HR) 50 MCG/HR Place 1 patch (50 mcg total) onto the skin every 3 (three) days.  5 patch  0  . folic acid (FOLVITE) 1 MG tablet Take 1 tablet daily.  30 tablet  0  . levETIRAcetam (KEPPRA) 500 MG tablet Take 1 tablet (500 mg total) by mouth 2 (two) times daily.  28 tablet  0  . pantoprazole (PROTONIX) 40 MG tablet Take 1 tablet (40 mg total) by mouth 2 (two) times daily.  60 tablet  6  . polyethylene glycol (MIRALAX / GLYCOLAX) packet Take 17 g by  mouth daily as needed for mild constipation or moderate constipation.  14 each  0  . predniSONE (DELTASONE) 20 MG tablet Take 2 tablets (40 mg total) by mouth daily with breakfast.  60 tablet  5  . rizatriptan (MAXALT) 10 MG tablet Take 10 mg by mouth daily as needed for migraine. May repeat in 2 hours if needed      . traMADol (ULTRAM) 50 MG tablet Take 1-2 tablets (50-100 mg total) by mouth every 4 (four) hours as needed (cough).  84 tablet  0  . Vitamin D, Ergocalciferol, (DRISDOL) 50000 UNITS CAPS capsule Take 1 capsule (50,000 Units total) by mouth every 7 (seven) days. Take on Saturday  3 capsule  0   No current facility-administered medications on file prior to visit.      Allergies  Allergen Reactions  . Shellfish Allergy Anaphylaxis  . Gabapentin     rash    ROS:  Out of a complete 14 system review of symptoms, the patient complains only of the following symptoms, and all other reviewed systems are negative.  Fevers, chills, weight gain, fatigue Occasional chest pains, palpitations of the heart, swelling in the legs Decreased hearing, ringing in the ears, dizziness, difficulty swallowing Diarrhea Skin rash Blurred vision, loss of vision, eye pain Shortness of breath, cough, wheezing, snoring Diarrhea Anemia, easy bruising Feeling cold, increased thirst Joint pain, joint swelling, muscle cramps, achy muscles Skin sensitivity Memory loss, confusion, headache, numbness, weakness, slurred speech, dizziness, history of seizures, tremor Depression, anxiety, not enough sleep, decreased energy, change in appetite, disinterest in activities, hallucinations in the distant past Insomnia, restless legs  Blood pressure 127/87, pulse 106, height  (1.549 m), weight 146 lb (66.225 kg).  Physical Exam  General: The patient is alert and cooperative at the time of the examination.  Eyes: Pupils are equal, round, and reactive to light. Discs are flat bilaterally.  Neck: The  neck is supple, no carotid bruits are noted.  Respiratory: The respiratory examination is clear.  Cardiovascular: The cardiovascular examination reveals a regular rate and rhythm, no obvious murmurs or rubs are noted.  Skin: Extremities are with 2+ edema below the knees bilaterally  Neurologic Exam  Mental status: The patient is alert and oriented x 3 at the time of the examination. The patient has apparent normal recent and remote memory, with an apparently normal attention span and concentration ability.  Cranial nerves: Facial symmetry is present. There is good sensation of the face to pinprick and soft touch bilaterally. The strength of the facial muscles and the muscles to head turning and shoulder shrug are normal bilaterally. Speech is well enunciated, no aphasia or dysarthria is noted. Extraocular movements are full. Visual fields are full. The tongue is midline, and the patient has symmetric elevation of the soft palate. No obvious hearing deficits are noted.  Motor: The  motor testing reveals 5 over 5 strength of all 4 extremities, but the patient does have some giveaway weakness in arms and legs at times. Good symmetric motor tone is noted throughout.  Sensory: Sensory testing is intact to pinprick, soft touch, vibration sensation, and position sense on all 4 extremities, with exception of a stocking pattern pinprick sensory deficit in the lower extremities just above the ankles bilaterally. There was some decrease in pinprick sensation on the right arm and leg relative to the left. No evidence of extinction is noted.  Coordination: Cerebellar testing reveals good finger-nose-finger and heel-to-shin bilaterally. The patient appears to be somewhat tremulous throughout.  Gait and station: Gait is slightly wide-based, the patient uses a walker for ambulation. Tandem gait was unsteady. Romberg is negative. No drift is seen.  Reflexes: Deep tendon reflexes are symmetric and normal  bilaterally. Toes are downgoing bilaterally.   MRI brain 10/24/13:  IMPRESSION:  Patchy subcortical hyperintensities in the left parietal white  matter. These are nonspecific and could be seen with chronic  microvascular ischemia. They do not appear to be acute and do not  enhance.  No acute infarct or mass. Normal enhancement.    Assessment/Plan:  1. Pulmonary fibrosis  2. History of migraine   3. History seizures  4. Abnormal MRI brain  The patient has had worsening of her migraine since the stress induced by the diagnosis of pulmonary fibrosis. The patient has had headaches throughout her adult life. The patient will be given a trial on Topamax. The patient has been on this previously, and she believes that it was of some benefit. The patient will continue the Maxalt to take if needed for the headache. She will followup through this office in about 3-4 months.  Marlan Palau MD 05/18/2014 7:30 PM  [facility] [address] [place], [place] [address]  Phone [phone] Fax [phone]

## 2014-05-30 DIAGNOSIS — I5032 Chronic diastolic (congestive) heart failure: Secondary | ICD-10-CM | POA: Insufficient documentation

## 2014-05-30 DIAGNOSIS — J84116 Cryptogenic organizing pneumonia: Secondary | ICD-10-CM | POA: Insufficient documentation

## 2014-06-01 ENCOUNTER — Telehealth: Payer: Self-pay | Admitting: Neurology

## 2014-06-01 DIAGNOSIS — R569 Unspecified convulsions: Secondary | ICD-10-CM

## 2014-06-01 MED ORDER — LEVETIRACETAM 500 MG PO TABS: 500.0000 mg | ORAL_TABLET | Freq: Two times a day (BID) | ORAL | Status: DC

## 2014-06-01 NOTE — Telephone Encounter (Signed)
Patient requesting Rx refill for levETIRAcetam (KEPPRA) 500 MG tablet.  Please call and advise and may leave detailed message if not available.  Last dose will be tomorrow.

## 2014-06-01 NOTE — Telephone Encounter (Signed)
I will refill the Keppra.

## 2014-06-01 NOTE — Telephone Encounter (Signed)
Patient is requesting we refill Keppra, however, we have not prescribed this drug before.  Okay to refill?

## 2014-06-02 ENCOUNTER — Ambulatory Visit: Payer: Medicaid Other | Attending: Internal Medicine | Admitting: Internal Medicine

## 2014-06-02 ENCOUNTER — Encounter: Payer: Self-pay | Admitting: Internal Medicine

## 2014-06-02 VITALS — BP 103/68 | HR 83 | Temp 97.9°F | Resp 18 | Ht 61.0 in | Wt 148.0 lb

## 2014-06-02 DIAGNOSIS — IMO0001 Reserved for inherently not codable concepts without codable children: Secondary | ICD-10-CM | POA: Insufficient documentation

## 2014-06-02 DIAGNOSIS — R569 Unspecified convulsions: Secondary | ICD-10-CM | POA: Diagnosis present

## 2014-06-02 DIAGNOSIS — J8410 Pulmonary fibrosis, unspecified: Secondary | ICD-10-CM | POA: Diagnosis not present

## 2014-06-02 DIAGNOSIS — Z791 Long term (current) use of non-steroidal anti-inflammatories (NSAID): Secondary | ICD-10-CM | POA: Insufficient documentation

## 2014-06-02 DIAGNOSIS — G43019 Migraine without aura, intractable, without status migrainosus: Secondary | ICD-10-CM | POA: Diagnosis not present

## 2014-06-02 DIAGNOSIS — IMO0002 Reserved for concepts with insufficient information to code with codable children: Secondary | ICD-10-CM | POA: Diagnosis not present

## 2014-06-02 DIAGNOSIS — Z79899 Other long term (current) drug therapy: Secondary | ICD-10-CM | POA: Diagnosis not present

## 2014-06-02 DIAGNOSIS — Z9981 Dependence on supplemental oxygen: Secondary | ICD-10-CM | POA: Diagnosis not present

## 2014-06-02 DIAGNOSIS — J8409 Other alveolar and parieto-alveolar conditions: Secondary | ICD-10-CM | POA: Diagnosis not present

## 2014-06-02 DIAGNOSIS — J8489 Other specified interstitial pulmonary diseases: Secondary | ICD-10-CM

## 2014-06-02 MED ORDER — LEVETIRACETAM 500 MG PO TABS: 500.0000 mg | ORAL_TABLET | Freq: Two times a day (BID) | ORAL | Status: DC

## 2014-06-02 NOTE — Progress Notes (Signed)
Patient ID: [name] [name], female   DOB: Dec 10, 1960, 53 y.o.   MRN: [id number]   [name] [name], is a 53 y.o. female  AOZ:[id number]  NGE:[id number]  DOB - 1960/11/11  Chief Complaint  Patient presents with  . Follow-up        Subjective:   [name] [name] is a 53 y.o. female here today for a follow up visit. Patient has medical history significant for fibromyalgia, chronic pain syndrome, seizure disorder, pulmonary fibrosis and chronic migraine headache. Today for routine follow-up and medication refill. Patient is on home oxygen at 3 L by nasal cannula, no new complaint today, still has swelling lower extremity. Shortness of breath is at baseline as well as cough. She also needs refill on her medications. Patient has No headache today, No chest pain, No abdominal pain - No Nausea, No new weakness tingling or numbness. Patient is up-to-date on her preventative medical care.  Problem  Seizures    ALLERGIES: Allergies  Allergen Reactions  . Shellfish Allergy Anaphylaxis  . Gabapentin     rash    PAST MEDICAL HISTORY: Past Medical History  Diagnosis Date  . Fibromyalgia   . Chronic pain   . Seizures   . Migraine   . Pneumonia   . Pulmonary fibrosis   . Migraine without aura, with intractable migraine, so stated, without mention of status migrainosus 05/18/2014    MEDICATIONS AT HOME: Prior to Admission medications   Medication Sig Start Date End Date Taking? Authorizing Provider  albuterol (PROVENTIL HFA;VENTOLIN HFA) 108 (90 BASE) MCG/ACT inhaler Inhale 2 puffs into the lungs every 6 (six) hours as needed for wheezing or shortness of breath. 03/03/14  Yes Nyoka Cowden, MD  Cyanocobalamin (B-12) 2000 MCG TABS Take 2,000 mcg by mouth daily. 04/28/14  Yes Nyoka Cowden, MD  etodolac (LODINE) 500 MG tablet Take 1 tablet (500 mg total) by mouth 2 (two) times daily. 04/28/14  Yes Nyoka Cowden, MD  fentaNYL (DURAGESIC - DOSED MCG/HR) 50 MCG/HR Place 1 patch (50 mcg total) onto the skin  every 3 (three) days. 04/28/14  Yes Nyoka Cowden, MD  folic acid (FOLVITE) 1 MG tablet Take 1 tablet daily. 03/06/14  Yes Ambrose Finland, NP  furosemide (LASIX) 20 MG tablet Take 20 mg by mouth daily. 04/20/14 04/20/15 Yes Historical Provider, MD  levETIRAcetam (KEPPRA) 500 MG tablet Take 1 tablet (500 mg total) by mouth 2 (two) times daily. 06/02/14  Yes Quentin Angst, MD  mycophenolate (CELLCEPT) 500 MG tablet Take 500 mg by mouth 2 (two) times daily. 04/28/14 04/28/15 Yes Historical Provider, MD  pantoprazole (PROTONIX) 40 MG tablet Take 1 tablet (40 mg total) by mouth 2 (two) times daily.   Yes Ambrose Finland, NP  predniSONE (DELTASONE) 20 MG tablet Take 2 tablets (40 mg total) by mouth daily with breakfast. 03/03/14  Yes Nyoka Cowden, MD  pregabalin (LYRICA) 300 MG capsule Take 300 mg by mouth 2 (two) times daily.   Yes Historical Provider, MD  rizatriptan (MAXALT) 10 MG tablet Take 10 mg by mouth daily as needed for migraine. May repeat in 2 hours if needed   Yes Historical Provider, MD  topiramate (TOPAMAX) 25 MG tablet Take one tablet at night for one week, then take 2 tablets at night for one week, then take 3 tablets at night. 05/18/14  Yes York Spaniel, MD  traMADol (ULTRAM) 50 MG tablet Take 1-2 tablets (50-100 mg total) by mouth every 4 (four) hours as  needed (cough). 04/28/14  Yes Nyoka Cowden, MD  Vitamin D, Ergocalciferol, (DRISDOL) 50000 UNITS CAPS capsule Take 1 capsule (50,000 Units total) by mouth every 7 (seven) days. Take on Saturday 04/28/14  Yes Nyoka Cowden, MD  polyethylene glycol [facility] / GLYCOLAX) packet Take 17 g by mouth daily as needed for mild constipation or moderate constipation. 03/06/14   Ambrose Finland, NP     Objective:   Filed Vitals:   06/02/14 1237  BP: 103/68  Pulse: 83  Temp: 97.9 F (36.6 C)  TempSrc: Oral  Resp: 18  Height:  (1.549 m)  Weight: 148 lb (67.132 kg)  SpO2: 100%    Exam General appearance : Awake, alert, not in any  distress. Speech Clear. Not toxic looking, on oxygen by nasal cannula. HEENT: Atraumatic and Normocephalic, pupils equally reactive to light and accomodation Neck: supple, no JVD. No cervical lymphadenopathy.  Chest: Reduced air entry bilaterally, fine crepitations bilaterally posteriorly CVS: S1 S2 regular, no murmurs.  Abdomen: Bowel sounds present, Non tender and not distended with no gaurding, rigidity or rebound. Extremities: B/L Lower Ext shows no edema, both legs are warm to touch Neurology: Awake alert, and oriented X 3, CN II-XII intact, Non focal Skin:No Rash Wounds:N/A  Data Review No results found for this basename: HGBA1C     Assessment & Plan   1. Seizures  - levETIRAcetam (KEPPRA) 500 MG tablet; Take 1 tablet (500 mg total) by mouth 2 (two) times daily.  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  The patient was given clear instructions to go to ER or return to medical center if symptoms don't improve, worsen or new problems develop. The patient verbalized understanding. The patient was told to call to get lab results if they haven't heard anything in the next week.   This note has been created with Education officer, environmental. Any transcriptional errors are unintentional.    Jeanann Lewandowsky, MD, MHA, FACP, FAAP [facility] [place], [place] [phone]   06/02/2014, 1:00 PM

## 2014-06-02 NOTE — Progress Notes (Signed)
Pt comes in to f/u with PCP for multiple medical history Pt states she is compliant with taking prescribed medications daily Need refill on Keppra 500 mg tab Pt uses home oxygen @ 3liters [place] Sats r/a 100% Swelling noted to Lower extrem with slight sob

## 2014-06-15 ENCOUNTER — Ambulatory Visit: Payer: Medicaid Other | Attending: Anesthesiology | Admitting: Physical Therapy

## 2014-06-16 ENCOUNTER — Other Ambulatory Visit: Payer: Self-pay | Admitting: Family Medicine

## 2014-06-16 ENCOUNTER — Other Ambulatory Visit: Payer: Medicaid Other

## 2014-06-17 LAB — BASIC METABOLIC PANEL
BUN: 21 mg/dL (ref 6–23)
CHLORIDE: 104 meq/L (ref 96–112)
CO2: 24 mEq/L (ref 19–32)
Calcium: 8.9 mg/dL (ref 8.4–10.5)
Creat: 1.18 mg/dL — ABNORMAL HIGH (ref 0.50–1.10)
GLUCOSE: 76 mg/dL (ref 70–99)
POTASSIUM: 3.8 meq/L (ref 3.5–5.3)
Sodium: 141 mEq/L (ref 135–145)

## 2014-08-24 ENCOUNTER — Encounter: Payer: Self-pay | Admitting: Adult Health

## 2014-08-24 ENCOUNTER — Other Ambulatory Visit: Payer: Self-pay | Admitting: Adult Health

## 2014-08-24 ENCOUNTER — Encounter (INDEPENDENT_AMBULATORY_CARE_PROVIDER_SITE_OTHER): Payer: Medicaid Other | Admitting: Diagnostic Neuroimaging

## 2014-08-24 ENCOUNTER — Ambulatory Visit
Admission: RE | Admit: 2014-08-24 | Discharge: 2014-08-24 | Disposition: A | Discharge status: Home / Self Care | Payer: Medicaid Other | Source: Ambulatory Visit | Attending: Adult Health | Admitting: Adult Health

## 2014-08-24 ENCOUNTER — Ambulatory Visit (INDEPENDENT_AMBULATORY_CARE_PROVIDER_SITE_OTHER): Payer: Medicaid Other | Admitting: Adult Health

## 2014-08-24 VITALS — BP 116/77 | HR 74 | Temp 97.0°F | Ht 61.0 in | Wt 156.0 lb

## 2014-08-24 DIAGNOSIS — G44309 Post-traumatic headache, unspecified, not intractable: Secondary | ICD-10-CM | POA: Insufficient documentation

## 2014-08-24 DIAGNOSIS — S098XXA Other specified injuries of head, initial encounter: Secondary | ICD-10-CM

## 2014-08-24 DIAGNOSIS — R519 Headache, unspecified: Secondary | ICD-10-CM

## 2014-08-24 DIAGNOSIS — R51 Headache: Secondary | ICD-10-CM

## 2014-08-24 NOTE — Progress Notes (Signed)
I have read the note, and I agree with the clinical assessment and plan.  Amon Costilla KEITH   

## 2014-08-24 NOTE — Patient Instructions (Signed)

## 2014-08-24 NOTE — Progress Notes (Signed)
PATIENT: [name] [name] DOB: 04/05/1961  REASON FOR VISIT: follow up HISTORY FROM: patient  HISTORY OF PRESENT ILLNESS: [name] [name] is a 53 year old female with a history of migraines and seizures. She returns today for follow-up. The patient has been taking Topamax 75 mg at bedtime and Maxalt for her headaches. She states that her headaches have been doing well up until she fell last Thursday night. She fell forward and hit the top of her head on the edge of her nightstand. She states that it hurts to touch and everytime she moves her head she hears a "clicking sound."  She states that since then she has had a headache. States that the headache comes and goes just about everyday. The patient did not go to the emergency room.  She has had nausea since Thursday night but no vomiting. Patient states that her headache is a 9/10. She has been having blurry vision off and on since the fall and occasional double vision. She states these headaches are different from what she normally has. She was started on a medication from [facility] unsure of the name that causes her diarrhea maybe cellcept?Marland Kitchen. She denies being on any blood thinners. The patient continues to take Keppra for seizures. She is not had any recent seizures. Her last seizure was over 15 years ago.   HISTORY 05/18/14: [name] [name] is a 53 year old right-handed Hispanic female with a history of migraine headaches since she was in high school. The patient indicates that usually she would have on average one headache a week, but she has been under stress recently after she was given the diagnosis of pulmonary fibrosis in March of 2015. Since that time, the headaches have become more frequent, occurring 3 or 4 times a week. The patient has taken Maxalt for headaches with some benefit, and she indicates that the duration of the headache is about 3-4 hours with the Maxalt. The patient indicates that the headaches sometimes will begin on one side or the other and then  generalize, but usually the headaches begin throughout the head, associated with a pressure and a throbbing sensation. The patient will have some nausea and vomiting at times, and she will have some blurring of vision with occasional loss of vision. She reports that she has significant phonophobia and photophobia. Alcohol, chocolate, weather changes, and perfumes will bring on the headache. She has undergone MRI evaluation of the brain in February 2015 showing a chronic left parietal white matter change. The patient has a history of seizures again since high school, and she has been on various medications such as Dilantin, Trileptal, and more recently Keppra for the seizures. The last seizure event was 15 years ago. The patient has been using a walker since the diagnosis of pulmonary fibrosis. She is sent to this office for an evaluation.  REVIEW OF SYSTEMS: Out of a complete 14 system review of symptoms, the patient complains only of the following symptoms, and all other reviewed systems are negative.  Hearing loss Restless legs, daytime sleepiness Joint pain, back pain, walking difficulty Weakness  ALLERGIES: Allergies  Allergen Reactions  . Shellfish Allergy Anaphylaxis  . Gabapentin     rash    HOME MEDICATIONS: Outpatient Prescriptions Prior to Visit  Medication Sig Dispense Refill  . albuterol (PROVENTIL HFA;VENTOLIN HFA) 108 (90 BASE) MCG/ACT inhaler Inhale 2 puffs into the lungs every 6 (six) hours as needed for wheezing or shortness of breath. 1 Inhaler 2  . Cyanocobalamin (B-12) 2000 MCG TABS Take  2,000 mcg by mouth daily. 15 tablet 0  . etodolac (LODINE) 500 MG tablet Take 1 tablet (500 mg total) by mouth 2 (two) times daily. 30 tablet 0  . fentaNYL (DURAGESIC - DOSED MCG/HR) 50 MCG/HR Place 1 patch (50 mcg total) onto the skin every 3 (three) days. 5 patch 0  . folic acid (FOLVITE) 1 MG tablet Take 1 tablet daily. 30 tablet 0  . furosemide (LASIX) 20 MG tablet Take 20 mg by mouth  daily.    Marland Kitchen. levETIRAcetam (KEPPRA) 500 MG tablet Take 1 tablet (500 mg total) by mouth 2 (two) times daily. 180 tablet 3  . mycophenolate (CELLCEPT) 500 MG tablet Take 500 mg by mouth 2 (two) times daily.    . pantoprazole (PROTONIX) 40 MG tablet Take 1 tablet (40 mg total) by mouth 2 (two) times daily. 60 tablet 6  . polyethylene glycol (MIRALAX / GLYCOLAX) packet Take 17 g by mouth daily as needed for mild constipation or moderate constipation. 14 each 0  . predniSONE (DELTASONE) 20 MG tablet Take 2 tablets (40 mg total) by mouth daily with breakfast. 60 tablet 5  . pregabalin (LYRICA) 300 MG capsule Take 300 mg by mouth 2 (two) times daily.    . rizatriptan (MAXALT) 10 MG tablet Take 10 mg by mouth daily as needed for migraine. May repeat in 2 hours if needed    . topiramate (TOPAMAX) 25 MG tablet Take one tablet at night for one week, then take 2 tablets at night for one week, then take 3 tablets at night. 90 tablet 3  . traMADol (ULTRAM) 50 MG tablet Take 1-2 tablets (50-100 mg total) by mouth every 4 (four) hours as needed (cough). 84 tablet 0  . Vitamin D, Ergocalciferol, (DRISDOL) 50000 UNITS CAPS capsule Take 1 capsule (50,000 Units total) by mouth every 7 (seven) days. Take on Saturday 3 capsule 0   No facility-administered medications prior to visit.    PAST MEDICAL HISTORY: Past Medical History  Diagnosis Date  . Fibromyalgia   . Chronic pain   . Seizures   . Migraine   . Pneumonia   . Pulmonary fibrosis   . Migraine without aura, with intractable migraine, so stated, without mention of status migrainosus 05/18/2014    PAST SURGICAL HISTORY: Past Surgical History  Procedure Laterality Date  . Video bronchoscopy N/A 11/21/2013    Procedure: VIDEO BRONCHOSCOPY;  Surgeon: Delight OvensEdward B Gerhardt, MD;  [facility];  Service: Thoracic;  Laterality: N/A;  . Video assisted thoracoscopy Left 11/21/2013    Procedure: VIDEO ASSISTED THORACOSCOPY;  Surgeon: Delight OvensEdward B Gerhardt, MD;   [facility];  Service: Thoracic;  Laterality: Left;  . Lung biopsy Left 11/21/2013    Procedure: LUNG BIOPSY;  Surgeon: Delight OvensEdward B Gerhardt, MD;  [facility];  Service: Thoracic;  Laterality: Left;  . Abdominal hysterectomy      FAMILY HISTORY: Family History  Problem Relation Age of Onset  . Hypertension Mother   . Hepatitis C Mother   . Diabetes Father   . Hypertension Father   . Bipolar disorder Brother   . Multiple sclerosis Sister     SOCIAL HISTORY: History   Social History  . Marital Status: Single    Spouse Name: N/A    Number of Children: 0  . Years of Education: college 3   Occupational History  . unemployed    Social History Main Topics  . Smoking status: Former Smoker -- 1.00 packs/day for 10 years  Types: Cigarettes    Quit date: 09/18/2004  . Smokeless tobacco: Never Used  . Alcohol Use: No  . Drug Use: No  . Sexual Activity: No   Other Topics Concern  . Not on file   Social History Narrative      PHYSICAL EXAM  Filed Vitals:   08/24/14 1453  BP: 116/77  Pulse: 74  Temp: 97 F (36.1 C)  TempSrc: Oral  Height: 5\' 1"  (1.549 m)  Weight: 156 lb (70.761 kg)   Body mass index is 29.49 kg/(m^2).  Generalized: Well developed, in no acute distress   Neurological examination  Mentation: Alert oriented to time, place, history taking. Follows all commands speech and language fluent Cranial nerve II-XII: Pupils were equal round reactive to light. Extraocular movements were full, visual field were full on confrontational test. Facial sensation and strength were normal. Uvula tongue midline. Head turning and shoulder shrug  were normal and symmetric. Motor: The motor testing reveals 5 over 5 strength of all 4 extremities. Good symmetric motor tone is noted throughout.  Sensory: Sensory testing is intact to soft touch on all 4 extremities. No evidence of extinction is noted.  Coordination: Cerebellar testing reveals good finger-nose-finger and  heel-to-shin bilaterally.  Gait and station: Gait is normal, uses a walker to ambulate. Tandem gait was not attempted. Romberg is negative. No drift is seen.  Reflexes: Deep tendon reflexes are symmetric and normal bilaterally.    DIAGNOSTIC DATA (LABS, IMAGING, TESTING) - I reviewed patient records, labs, notes, testing and imaging myself where available.  Lab Results  Component Value Date   WBC 26.3 Repeated and verified X2.* 03/03/2014   HGB 13.8 03/03/2014   HCT 43.8 03/03/2014   MCV 76.6* 03/03/2014   PLT 291.0 03/03/2014      Component Value Date/Time   NA 141 06/16/2014 0000   K 3.8 06/16/2014 0000   CL 104 06/16/2014 0000   CO2 24 06/16/2014 0000   GLUCOSE 76 06/16/2014 0000   BUN 21 06/16/2014 0000   CREATININE 1.18* 06/16/2014 0000   CREATININE 1.3* 03/03/2014 1252   CALCIUM 8.9 06/16/2014 0000   PROT 7.0 03/06/2014 1557   ALBUMIN 3.5 03/06/2014 1557   AST 19 03/06/2014 1557   ALT 22 03/06/2014 1557   ALKPHOS 71 03/06/2014 1557   BILITOT 0.4 03/06/2014 1557   GFRNONAA 78 03/06/2014 1557   GFRNONAA >90 11/27/2013 0620   GFRAA >89 03/06/2014 1557   GFRAA >90 11/27/2013 0620   No results found for: CHOL, HDL, LDLCALC, LDLDIRECT, TRIG, CHOLHDL No results found for: ZOXW9U Lab Results  Component Value Date   VITAMINB12 231 10/07/2013   Lab Results  Component Value Date   TSH 0.69 03/03/2014      ASSESSMENT AND PLAN 53 y.o. year old female  has a past medical history of Fibromyalgia; Chronic pain; Seizures; Migraine; Pneumonia; Pulmonary fibrosis; and Migraine without aura, with intractable migraine, so stated, without mention of status migrainosus (05/18/2014). here with:  1. Trauma to the head due to a fall 2. Headache  The patient has a history of migraines. She's been taking Topamax 75 mg at bedtime. She states her headaches were pretty controlled until she had a fall last Thursday. She states since then she has had a headache daily associated with some  nausea and blurred vision. She did not go to the emergency room after her fall. Her examination today is relatively unremarkable. I will send the patient for a CT scan without contrast today. If  the patient's symptoms worsen or she develops new symptoms she was advised to go to the emergency room. I will call the patient with her CT results once they are available to me.  Butch Penny, MSN, NP-C 08/24/2014, 3:15 PM [facility] [address], [address] [place], [place] [address] [phone]  Note: This document was prepared with digital dictation and possible smart phrase technology. Any transcriptional errors that result from this process are unintentional.

## 2014-08-25 ENCOUNTER — Telehealth: Payer: Self-pay | Admitting: Internal Medicine

## 2014-08-25 ENCOUNTER — Telehealth: Payer: Self-pay | Admitting: Adult Health

## 2014-08-25 NOTE — Telephone Encounter (Signed)
Pt. Called to speak to nurse about an accident she had, pt hit head and had to have an emergency CT scan. Please f/u with pt.

## 2014-08-25 NOTE — Telephone Encounter (Signed)
I called the patient. Her CT scan of the brain was normal. She states that her headache is actually a little better today.I have advised the patient continue taking Topamax 75 mg at bedtime. If her headache worsens she should let us know. Patient verbalized understanding

## 2014-09-08 ENCOUNTER — Telehealth: Payer: Self-pay | Admitting: Internal Medicine

## 2014-09-08 NOTE — Telephone Encounter (Addendum)
Pt. Is calling stating that she was told that she could "come to in to the office at any time with or without an appointment", patient called to request an appointment with her PCP and was upset she could not obtain an appointment with the doctor, patient stated that she would like talk to nurse, or doctor. Please f/u with pt.

## 2014-09-10 ENCOUNTER — Encounter: Payer: Self-pay | Admitting: Internal Medicine

## 2014-09-10 ENCOUNTER — Ambulatory Visit: Payer: Medicaid Other | Attending: Internal Medicine | Admitting: Internal Medicine

## 2014-09-10 ENCOUNTER — Ambulatory Visit (HOSPITAL_COMMUNITY)
Admission: RE | Admit: 2014-09-10 | Discharge: 2014-09-10 | Disposition: A | Discharge status: Home / Self Care | Payer: Medicaid Other | Source: Ambulatory Visit | Attending: Internal Medicine | Admitting: Internal Medicine

## 2014-09-10 VITALS — BP 112/82 | HR 80 | Temp 98.4°F | Resp 16 | Ht 61.0 in | Wt 153.0 lb

## 2014-09-10 DIAGNOSIS — N200 Calculus of kidney: Secondary | ICD-10-CM | POA: Diagnosis not present

## 2014-09-10 DIAGNOSIS — M438X6 Other specified deforming dorsopathies, lumbar region: Secondary | ICD-10-CM | POA: Diagnosis not present

## 2014-09-10 DIAGNOSIS — M545 Low back pain, unspecified: Secondary | ICD-10-CM

## 2014-09-10 DIAGNOSIS — R569 Unspecified convulsions: Secondary | ICD-10-CM | POA: Diagnosis not present

## 2014-09-10 DIAGNOSIS — M797 Fibromyalgia: Secondary | ICD-10-CM | POA: Diagnosis present

## 2014-09-10 LAB — URINALYSIS, COMPLETE
BACTERIA UA: NONE SEEN
Crystals: NONE SEEN
Glucose, UA: NEGATIVE mg/dL
HGB URINE DIPSTICK: NEGATIVE
KETONES UR: NEGATIVE mg/dL
NITRITE: NEGATIVE
Protein, ur: NEGATIVE mg/dL
Specific Gravity, Urine: 1.029 (ref 1.005–1.030)
Urobilinogen, UA: 0.2 mg/dL (ref 0.0–1.0)
pH: 5 (ref 5.0–8.0)

## 2014-09-10 LAB — COMPLETE METABOLIC PANEL WITH GFR
ALK PHOS: 117 U/L (ref 39–117)
ALT: 19 U/L (ref 0–35)
AST: 19 U/L (ref 0–37)
Albumin: 4.2 g/dL (ref 3.5–5.2)
BILIRUBIN TOTAL: 0.7 mg/dL (ref 0.2–1.2)
BUN: 19 mg/dL (ref 6–23)
CALCIUM: 9.9 mg/dL (ref 8.4–10.5)
CO2: 22 mEq/L (ref 19–32)
CREATININE: 1.58 mg/dL — AB (ref 0.50–1.10)
Chloride: 104 mEq/L (ref 96–112)
GFR, Est African American: 43 mL/min — ABNORMAL LOW
GFR, Est Non African American: 37 mL/min — ABNORMAL LOW
Glucose, Bld: 72 mg/dL (ref 70–99)
Potassium: 4.5 mEq/L (ref 3.5–5.3)
Sodium: 143 mEq/L (ref 135–145)
Total Protein: 6.6 g/dL (ref 6.0–8.3)

## 2014-09-10 LAB — LIPID PANEL
CHOL/HDL RATIO: 5.3 ratio
Cholesterol: 291 mg/dL — ABNORMAL HIGH (ref 0–200)
HDL: 55 mg/dL (ref >39)
LDL CALC: 200 mg/dL — AB (ref 0–99)
TRIGLYCERIDES: 179 mg/dL — AB (ref <150)
VLDL: 36 mg/dL (ref 0–40)

## 2014-09-10 LAB — VITAMIN D 25 HYDROXY (VIT D DEFICIENCY, FRACTURES): Vit D, 25-Hydroxy: 18 ng/mL — ABNORMAL LOW (ref 30–100)

## 2014-09-10 LAB — CBC WITH DIFFERENTIAL/PLATELET
BASOS PCT: 0 % (ref 0–1)
Basophils Absolute: 0 10*3/uL (ref 0.0–0.1)
EOS ABS: 0 10*3/uL (ref 0.0–0.7)
EOS PCT: 0 % (ref 0–5)
HCT: 45.6 % (ref 36.0–46.0)
HEMOGLOBIN: 14.7 g/dL (ref 12.0–15.0)
LYMPHS ABS: 2.9 10*3/uL (ref 0.7–4.0)
Lymphocytes Relative: 25 % (ref 12–46)
MCH: 26 pg (ref 26.0–34.0)
MCHC: 32.2 g/dL (ref 30.0–36.0)
MCV: 80.6 fL (ref 78.0–100.0)
MPV: 10.9 fL (ref 9.4–12.4)
Monocytes Absolute: 0.9 10*3/uL (ref 0.1–1.0)
Monocytes Relative: 8 % (ref 3–12)
NEUTROS PCT: 67 % (ref 43–77)
Neutro Abs: 7.7 10*3/uL (ref 1.7–7.7)
Platelets: 192 10*3/uL (ref 150–400)
RBC: 5.66 MIL/uL — ABNORMAL HIGH (ref 3.87–5.11)
RDW: 15.2 % (ref 11.5–15.5)
WBC: 11.5 10*3/uL — AB (ref 4.0–10.5)

## 2014-09-10 LAB — POCT GLYCOSYLATED HEMOGLOBIN (HGB A1C): Hemoglobin A1C: 5.6

## 2014-09-10 LAB — TSH: TSH: 0.972 u[IU]/mL (ref 0.350–4.500)

## 2014-09-10 MED ORDER — FOLIC ACID 1 MG PO TABS: ORAL_TABLET | ORAL | Status: DC

## 2014-09-10 MED ORDER — VITAMIN D (ERGOCALCIFEROL) 1.25 MG (50000 UNIT) PO CAPS: 50000.0000 [IU] | ORAL_CAPSULE | ORAL | Status: DC

## 2014-09-10 MED ORDER — B-12 2000 MCG PO TABS: 2000.0000 ug | ORAL_TABLET | Freq: Every day | ORAL | Status: DC

## 2014-09-10 NOTE — Progress Notes (Signed)
Pt is here c/o severe back pain. Pt needs refills for her medication. Pt is requesting lab work. [facility] pain [facility] Pt recently fell and hit her head.

## 2014-09-10 NOTE — Patient Instructions (Signed)
Epilepsy Epilepsy is a disorder in which a person has repeated seizures over time. A seizure is a release of abnormal electrical activity in the brain. Seizures can cause a change in attention, behavior, or the ability to remain awake and alert (altered mental status). Seizures often involve uncontrollable shaking (convulsions).  Most people with epilepsy lead normal lives. However, people with epilepsy are at an increased risk of falls, accidents, and injuries. Therefore, it is important to begin treatment right away. CAUSES  Epilepsy has many possible causes. Anything that disturbs the normal pattern of brain cell activity can lead to seizures. This may include:   Head injury.  Birth trauma.  High fever as a child.  Stroke.  Bleeding into or around the brain.  Certain drugs.  Prolonged low oxygen, such as what occurs after CPR efforts.  Abnormal brain development.  Certain illnesses, such as meningitis, encephalitis (brain infection), malaria, and other infections.  An imbalance of nerve signaling chemicals (neurotransmitters).  SIGNS AND SYMPTOMS  The symptoms of a seizure can vary greatly from one person to another. Right before a seizure, you may have a warning (aura) that a seizure is about to occur. An aura may include the following symptoms:  Fear or anxiety.  Nausea.  Feeling like the room is spinning (vertigo).  Vision changes, such as seeing flashing lights or spots. Common symptoms during a seizure include:  Abnormal sensations, such as an abnormal smell or a bitter taste in the mouth.   Sudden, general body stiffness.   Convulsions that involve rhythmic jerking of the face, arm, or leg on one or both sides.   Sudden change in consciousness.   Appearing to be awake but not responding.   Appearing to be asleep but cannot be awakened.   Grimacing, chewing, lip smacking, drooling, tongue biting, or loss of bowel or bladder control. After a seizure,  you may feel sleepy for a while. DIAGNOSIS  Your health care provider will ask about your symptoms and take a medical history. Descriptions from any witnesses to your seizures will be very helpful in the diagnosis. A physical exam, including a detailed neurological exam, is necessary. Various tests may be done, such as:   An electroencephalogram (EEG). This is a painless test of your brain waves. In this test, a diagram is created of your brain waves. These diagrams can be interpreted by a specialist.  An MRI of the brain.   A CT scan of the brain.   A spinal tap (lumbar puncture, LP).  Blood tests to check for signs of infection or abnormal blood chemistry. TREATMENT  There is no cure for epilepsy, but it is generally treatable. Once epilepsy is diagnosed, it is important to begin treatment as soon as possible. For most people with epilepsy, seizures can be controlled with medicines. The following may also be used:  A pacemaker for the brain (vagus nerve stimulator) can be used for people with seizures that are not well controlled by medicine.  Surgery on the brain. For some people, epilepsy eventually goes away. HOME CARE INSTRUCTIONS   Follow your health care provider's recommendations on driving and safety in normal activities.  Get enough rest. Lack of sleep can cause seizures.  Only take over-the-counter or prescription medicines as directed by your health care provider. Take any prescribed medicine exactly as directed.  Avoid any known triggers of your seizures.  Keep a seizure diary. Record what you recall about any seizure, especially any possible trigger.   Make   sure the people you live and work with know that you are prone to seizures. They should receive instructions on how to help you. In general, a witness to a seizure should:   Cushion your head and body.   Turn you on your side.   Avoid unnecessarily restraining you.   Not place anything inside your  mouth.   Call for emergency medical help if there is any question about what has occurred.   Follow up with your health care provider as directed. You may need regular blood tests to monitor the levels of your medicine.  SEEK MEDICAL CARE IF:   You develop signs of infection or other illness. This might increase the risk of a seizure.   You seem to be having more frequent seizures.   Your seizure pattern is changing.  SEEK IMMEDIATE MEDICAL CARE IF:   You have a seizure that does not stop after a few moments.   You have a seizure that causes any difficulty in breathing.   You have a seizure that results in a very severe headache.   You have a seizure that leaves you with the inability to speak or use a part of your body.  Document Released: 09/04/2005 Document Revised: 06/25/2013 Document Reviewed: 04/16/2013 [facility] Patient Information 2015 [place], [place]. This information is not intended to replace advice given to you by your health care provider. Make sure you discuss any questions you have with your health care provider. Back Pain, Adult Low back pain is very common. About 1 in 5 people have back pain.The cause of low back pain is rarely dangerous. The pain often gets better over time.About half of people with a sudden onset of back pain feel better in just 2 weeks. About 8 in 10 people feel better by 6 weeks.  CAUSES Some common causes of back pain include:  Strain of the muscles or ligaments supporting the spine.  Wear and tear (degeneration) of the spinal discs.  Arthritis.  Direct injury to the back. DIAGNOSIS Most of the time, the direct cause of low back pain is not known.However, back pain can be treated effectively even when the exact cause of the pain is unknown.Answering your caregiver's questions about your overall health and symptoms is one of the most accurate ways to make sure the cause of your pain is not dangerous. If your caregiver needs more  information, he or she may order lab work or imaging tests (X-rays or MRIs).However, even if imaging tests show changes in your back, this usually does not require surgery. HOME CARE INSTRUCTIONS For many people, back pain returns.Since low back pain is rarely dangerous, it is often a condition that people can learn to [facility]manageon their own.   Remain active. It is stressful on the back to sit or stand in one place. Do not sit, drive, or stand in one place for more than 30 minutes at a time. Take short walks on level surfaces as soon as pain allows.Try to increase the length of time you walk each day.  Do not stay in bed.Resting more than 1 or 2 days can delay your recovery.  Do not avoid exercise or work.Your body is made to move.It is not dangerous to be active, even though your back may hurt.Your back will likely heal faster if you return to being active before your pain is gone.  Pay attention to your body when you bend and lift. Many people have less discomfortwhen lifting if they bend their knees,  keep the load close to their bodies,and avoid twisting. Often, the most comfortable positions are those that put less stress on your recovering back.  Find a comfortable position to sleep. Use a firm mattress and lie on your side with your knees slightly bent. If you lie on your back, put a pillow under your knees.  Only take over-the-counter or prescription medicines as directed by your caregiver. Over-the-counter medicines to reduce pain and inflammation are often the most helpful.Your caregiver may prescribe muscle relaxant drugs.These medicines help dull your pain so you can more quickly return to your normal activities and healthy exercise.  Put ice on the injured area.  Put ice in a plastic bag.  Place a towel between your skin and the bag.  Leave the ice on for 15-20 minutes, 03-04 times a day for the first 2 to 3 days. After that, ice and heat may be alternated to reduce pain  and spasms.  Ask your caregiver about trying back exercises and gentle massage. This may be of some benefit.  Avoid feeling anxious or stressed.Stress increases muscle tension and can worsen back pain.It is important to recognize when you are anxious or stressed and learn ways to manage it.Exercise is a great option. SEEK MEDICAL CARE IF:  You have pain that is not relieved with rest or medicine.  You have pain that does not improve in 1 week.  You have new symptoms.  You are generally not feeling well. SEEK IMMEDIATE MEDICAL CARE IF:   You have pain that radiates from your back into your legs.  You develop new bowel or bladder control problems.  You have unusual weakness or numbness in your arms or legs.  You develop nausea or vomiting.  You develop abdominal pain.  You feel faint. Document Released: 09/04/2005 Document Revised: 03/05/2012 Document Reviewed: 01/06/2014 [facility] Gilbert[facility]Care Patient Information 2015 [place], [place]. This information is not intended to replace advice given to you by your health care provider. Make sure you discuss any questions you have with your health care provider.

## 2014-09-10 NOTE — Progress Notes (Signed)
Patient ID: [name] [name], female   DOB: 07/31/1961, 53 y.o.   MRN: [id number]   [name] [name], is a [age] y.o. female  WUJ:[id number]  NFA:[id number]  DOB - 08/06/1961  Chief Complaint  Patient presents with  . Follow-up        Subjective:   [name] [name] is a 53 y.o. female here today for a follow up visit. Patient has history of fibromyalgia, chronic pain syndrome, seizure disorder, migraine headache and pulmonary fibrosis , complaining of severe back pain. She also needs refills of her medications. She has been referred to pain clinic as [name]. She does a refill of her vitamin D and vitamin B. Patient has No headache today, No chest pain, No abdominal pain - No Nausea, No new weakness tingling or numbness, No Cough - SOB.  No problems updated.  ALLERGIES: Allergies  Allergen Reactions  . Shellfish Allergy Anaphylaxis  . Gabapentin     rash    PAST MEDICAL HISTORY: Past Medical History  Diagnosis Date  . Fibromyalgia   . Chronic pain   . Seizures   . Migraine   . Pneumonia   . Pulmonary fibrosis   . Migraine without aura, with intractable migraine, so stated, without mention of status migrainosus 05/18/2014    MEDICATIONS AT HOME: Prior to Admission medications   Medication Sig Start Date End Date Taking? Authorizing Provider  albuterol (PROVENTIL HFA;VENTOLIN HFA) 108 (90 BASE) MCG/ACT inhaler Inhale 2 puffs into the lungs every 6 (six) hours as needed for wheezing or shortness of breath. 03/03/14  Yes Nyoka CowdenMichael B Wert, MD  Cyanocobalamin (B-12) 2000 MCG TABS Take 2,000 mcg by mouth daily. 09/10/14  Yes Quentin Angstlugbemiga E Shirah Roseman, MD  etodolac (LODINE) 500 MG tablet Take 1 tablet (500 mg total) by mouth 2 (two) times daily. 04/28/14  Yes Nyoka CowdenMichael B Wert, MD  fentaNYL (DURAGESIC - DOSED MCG/HR) 50 MCG/HR Place 1 patch (50 mcg total) onto the skin every 3 (three) days. 04/28/14  Yes Nyoka CowdenMichael B Wert, MD  folic acid (FOLVITE) 1 MG tablet Take 1 tablet daily.  09/10/14  Yes Quentin Angstlugbemiga E Magali Bray, MD  furosemide (LASIX) 20 MG tablet Take 20 mg by mouth daily. 04/20/14 04/20/15 Yes Historical Provider, MD  levETIRAcetam (KEPPRA) 500 MG tablet Take 1 tablet (500 mg total) by mouth 2 (two) times daily. 06/02/14  Yes Quentin Angstlugbemiga E Arlicia Paquette, MD  mycophenolate (CELLCEPT) 500 MG tablet Take 500 mg by mouth 2 (two) times daily. 04/28/14 04/28/15 Yes Historical Provider, MD  pantoprazole (PROTONIX) 40 MG tablet Take 1 tablet (40 mg total) by mouth 2 (two) times daily.   Yes Ambrose FinlandValerie A Keck, NP  pregabalin (LYRICA) 300 MG capsule Take 300 mg by mouth 2 (two) times daily.   Yes Historical Provider, MD  topiramate (TOPAMAX) 25 MG tablet Take one tablet at night for one week, then take 2 tablets at night for one week, then take 3 tablets at night. 05/18/14  Yes York Spanielharles K Willis, MD  traMADol (ULTRAM) 50 MG tablet Take 1-2 tablets (50-100 mg total) by mouth every 4 (four) hours as needed (cough). 04/28/14  Yes Nyoka CowdenMichael B Wert, MD  Vitamin D, Ergocalciferol, (DRISDOL) 50000 UNITS CAPS capsule Take 1 capsule (50,000 Units total) by mouth every 7 (seven) days. Take on Saturday 09/10/14  Yes Quentin Angstlugbemiga E Guadalupe Nickless, MD  polyethylene glycol (MIRALAX / GLYCOLAX) packet Take 17 g by mouth daily as needed for mild constipation or moderate constipation. Patient not taking: Reported on  09/10/2014 03/06/14   Ambrose FinlandValerie A Keck, NP  predniSONE (DELTASONE) 20 MG tablet Take 2 tablets (40 mg total) by mouth daily with breakfast. Patient not taking: Reported on 09/10/2014 03/03/14   Nyoka CowdenMichael B Wert, MD  rizatriptan (MAXALT) 10 MG tablet Take 10 mg by mouth daily as needed for migraine. May repeat in 2 hours if needed    Historical Provider, MD     Objective:   Filed Vitals:   09/10/14 1110  BP: 112/82  Pulse: 80  Temp: 98.4 F (36.9 C)  TempSrc: Oral  Resp: 16  Height: 5\' 1"  (1.549 m)  Weight: 153 lb (69.4 kg)  SpO2: 100%    Exam General appearance : Awake, alert, not in any distress. Speech  Clear. Not toxic looking HEENT: Atraumatic and Normocephalic, pupils equally reactive to light and accomodation Neck: supple, no JVD. No cervical lymphadenopathy.  Chest:Good air entry bilaterally, no added sounds  CVS: S1 S2 regular, no murmurs.  Abdomen: Bowel sounds present, Non tender and not distended with no gaurding, rigidity or rebound. Extremities: B/L Lower Ext shows no edema, both legs are warm to touch Neurology: Awake alert, and oriented X 3, CN II-XII intact, Non focal  Data Review No results found for: HGBA1C   Assessment & Plan   1. Fibromyalgia  - Urinalysis, Complete - Vit D  25 hydroxy (rtn osteoporosis monitoring) - TSH - Lipid panel - POCT glycosylated hemoglobin (Hb A1C) - COMPLETE METABOLIC PANEL WITH GFR - CBC with Differential  - Vitamin D, Ergocalciferol, (DRISDOL) 50000 UNITS CAPS capsule; Take 1 capsule (50,000 Units total) by mouth every 7 (seven) days. Take on Saturday  Dispense: 12 capsule; Refill: 3 - Cyanocobalamin (B-12) 2000 MCG TABS; Take 2,000 mcg by mouth daily.  Dispense: 90 tablet; Refill: 3  2. Midline low back pain without sciatica  - DG Lumbar Spine Complete; Future  3. Seizures  - folic acid (FOLVITE) 1 MG tablet; Take 1 tablet daily.  Dispense: 90 tablet; Refill: 3   Return in about 3 months (around 12/10/2014) for Follow up Pain and comorbidities, Follow up HTN.  The patient was given clear instructions to go to ER or return to medical center if symptoms don't improve, worsen or new problems develop. The patient verbalized understanding. The patient was told to call to get lab results if they haven't heard anything in the next week.   This note has been created with Education officer, environmentalDragon speech recognition software and smart phrase technology. Any transcriptional errors are unintentional.    Jeanann LewandowskyJEGEDE, Aariyah Sampey, MD, MHA, FACP, FAAP [facility] [place], [place] [phone]   09/10/2014, 11:42 AM

## 2014-09-13 ENCOUNTER — Other Ambulatory Visit: Payer: Self-pay | Admitting: Internal Medicine

## 2014-09-13 DIAGNOSIS — N184 Chronic kidney disease, stage 4 (severe): Secondary | ICD-10-CM

## 2014-09-13 DIAGNOSIS — M439 Deforming dorsopathy, unspecified: Secondary | ICD-10-CM

## 2014-09-14 ENCOUNTER — Telehealth: Payer: Self-pay | Admitting: Internal Medicine

## 2014-09-14 NOTE — Telephone Encounter (Signed)
Patient calling to request results from X ray dated 09/10/14. Please assist.

## 2014-09-15 NOTE — Telephone Encounter (Signed)
Pt calling to retract note regarding refill request because she still has two refills available.  Pt still needs results for x-ray, please f/u with pt when results ready.

## 2014-09-15 NOTE — Telephone Encounter (Signed)
Patient calling again for x ray results. Informed patient that the original request for these results were put in yesterday and that the clinic staff has 24-48 hours to respond. Informed patient that she should be hearing from clinic staff by eod tomorrow,09/16/14. Patient also requesting medication refill for pantoprazole (PROTONIX) 40 MG tablet. Please assist.

## 2014-09-17 ENCOUNTER — Ambulatory Visit: Payer: Medicaid Other | Admitting: Internal Medicine

## 2014-09-22 NOTE — Telephone Encounter (Signed)
Pt calling to follow up on results for 09/10/14 x-ray. Please f/u with pt.

## 2014-09-22 NOTE — Telephone Encounter (Signed)
Pt aware of Xray Results

## 2014-09-25 NOTE — Telephone Encounter (Signed)
The place will contact the patient to schedule an appointment at her convenience  time

## 2014-09-29 ENCOUNTER — Other Ambulatory Visit: Payer: Self-pay | Admitting: Nephrology

## 2014-09-29 DIAGNOSIS — N179 Acute kidney failure, unspecified: Secondary | ICD-10-CM

## 2014-10-06 ENCOUNTER — Telehealth: Payer: Self-pay | Admitting: Emergency Medicine

## 2014-10-06 MED ORDER — ATORVASTATIN CALCIUM 40 MG PO TABS: 40.0000 mg | ORAL_TABLET | Freq: Every day | ORAL | Status: DC

## 2014-10-06 NOTE — Telephone Encounter (Signed)
Pt given lab results with instructions to start taking Lipitor 40 mg tab for high cholesterol levels Pt already instructed to keep referral appt with Orthopedics/Nephrologist  [facility]

## 2014-10-06 NOTE — Telephone Encounter (Signed)
-----   Message from Quentin Angstlugbemiga E Jegede, MD sent at 09/13/2014  1:10 PM EST ----- Please inform patient that the x-ray of her lumbar spine shows new compression deformity of uncertain chronicity. We will refer to orthopedic surgery. Referral had already placed.

## 2014-10-09 ENCOUNTER — Other Ambulatory Visit: Payer: Medicaid Other

## 2014-10-13 ENCOUNTER — Ambulatory Visit
Admission: RE | Admit: 2014-10-13 | Discharge: 2014-10-13 | Disposition: A | Discharge status: Home / Self Care | Payer: Medicaid Other | Source: Ambulatory Visit | Attending: Nephrology | Admitting: Nephrology

## 2014-10-13 DIAGNOSIS — N179 Acute kidney failure, unspecified: Secondary | ICD-10-CM

## 2014-11-06 ENCOUNTER — Telehealth: Payer: Self-pay | Admitting: Adult Health

## 2014-11-06 MED ORDER — TOPIRAMATE 25 MG PO TABS: 75.0000 mg | ORAL_TABLET | Freq: Every day | ORAL | Status: DC

## 2014-11-06 NOTE — Telephone Encounter (Signed)
Patient requesting refill for Rx topiramate (TOPAMAX) 25 MG tablet.  Patient has only 1 tablet left.  [facility].  Please call and advise.

## 2014-11-06 NOTE — Telephone Encounter (Signed)
Rx has been sent.  I called back.  She is aware.  

## 2014-11-26 ENCOUNTER — Other Ambulatory Visit (HOSPITAL_COMMUNITY): Payer: Self-pay | Admitting: Interventional Radiology

## 2014-11-26 DIAGNOSIS — IMO0002 Reserved for concepts with insufficient information to code with codable children: Secondary | ICD-10-CM

## 2014-11-26 DIAGNOSIS — M549 Dorsalgia, unspecified: Secondary | ICD-10-CM

## 2014-12-11 ENCOUNTER — Ambulatory Visit (HOSPITAL_COMMUNITY)
Admission: RE | Admit: 2014-12-11 | Discharge: 2014-12-11 | Disposition: A | Discharge status: Home / Self Care | Payer: Medicaid Other | Source: Ambulatory Visit | Attending: Interventional Radiology | Admitting: Interventional Radiology

## 2014-12-11 DIAGNOSIS — M549 Dorsalgia, unspecified: Secondary | ICD-10-CM

## 2014-12-11 DIAGNOSIS — IMO0002 Reserved for concepts with insufficient information to code with codable children: Secondary | ICD-10-CM

## 2014-12-14 ENCOUNTER — Encounter: Payer: Self-pay | Admitting: Internal Medicine

## 2014-12-14 ENCOUNTER — Ambulatory Visit: Payer: Medicaid Other | Attending: Internal Medicine | Admitting: Internal Medicine

## 2014-12-14 VITALS — BP 105/74 | HR 112 | Temp 98.4°F | Ht 61.0 in | Wt 150.8 lb

## 2014-12-14 DIAGNOSIS — M545 Low back pain, unspecified: Secondary | ICD-10-CM | POA: Insufficient documentation

## 2014-12-14 DIAGNOSIS — R202 Paresthesia of skin: Secondary | ICD-10-CM | POA: Insufficient documentation

## 2014-12-14 DIAGNOSIS — K029 Dental caries, unspecified: Secondary | ICD-10-CM | POA: Diagnosis not present

## 2014-12-14 DIAGNOSIS — M797 Fibromyalgia: Secondary | ICD-10-CM | POA: Diagnosis not present

## 2014-12-14 DIAGNOSIS — R200 Anesthesia of skin: Secondary | ICD-10-CM

## 2014-12-14 DIAGNOSIS — K219 Gastro-esophageal reflux disease without esophagitis: Secondary | ICD-10-CM | POA: Insufficient documentation

## 2014-12-14 LAB — COMPLETE METABOLIC PANEL WITH GFR
ALT: 14 U/L (ref 0–35)
AST: 17 U/L (ref 0–37)
Albumin: 4.4 g/dL (ref 3.5–5.2)
Alkaline Phosphatase: 84 U/L (ref 39–117)
BUN: 15 mg/dL (ref 6–23)
CO2: 24 mEq/L (ref 19–32)
Calcium: 10.2 mg/dL (ref 8.4–10.5)
Chloride: 107 mEq/L (ref 96–112)
Creat: 1.28 mg/dL — ABNORMAL HIGH (ref 0.50–1.10)
GFR, Est African American: 56 mL/min — ABNORMAL LOW
GFR, Est Non African American: 48 mL/min — ABNORMAL LOW
Glucose, Bld: 63 mg/dL — ABNORMAL LOW (ref 70–99)
Potassium: 4.2 mEq/L (ref 3.5–5.3)
Sodium: 146 mEq/L — ABNORMAL HIGH (ref 135–145)
Total Bilirubin: 0.8 mg/dL (ref 0.2–1.2)
Total Protein: 6.7 g/dL (ref 6.0–8.3)

## 2014-12-14 MED ORDER — PANTOPRAZOLE SODIUM 40 MG PO TBEC: 40.0000 mg | DELAYED_RELEASE_TABLET | Freq: Two times a day (BID) | ORAL | Status: DC

## 2014-12-14 MED ORDER — VITAMIN D (ERGOCALCIFEROL) 1.25 MG (50000 UNIT) PO CAPS: 50000.0000 [IU] | ORAL_CAPSULE | ORAL | Status: DC

## 2014-12-14 NOTE — Patient Instructions (Signed)

## 2014-12-14 NOTE — Progress Notes (Signed)
Patient ID: [name] [name], female   DOB: 10/30/1960, 54 y.o.   MRN: [id number]   [name] [name], is [name] 54 y.o. female  WUJ:[id number]  [id number]  DOB - 01-11-1961  Chief Complaint  Patient presents with  . Fibromyalgia        Subjective:   [name] [name] is [name] 54 y.o. female here today for [name] follow up visit. Patient has history of seizure disorder, fibromyalgia, chronic pain syndrome, pulmonary fibrosis and chronic migraine with aura presenting today for follow-up of seizure disorder and fibromyalgia. Patient followed up with [facility]. Major complaint today is low back pain rated 8 out of 10, non-radiating. She is also complaining of numbness and tingling of both hands that is new since the last follow-up, she complains of neck pain especially when she moves all that her heads sideways. She does not have outright weakness or dropping objects but she feels numbness and tingling. Patient has No chest pain, No abdominal pain - No Nausea.   Problem  Midline Low Back Pain Without Sciatica  Dental Caries  Numbness and Tingling  Gastroesophageal Reflux Disease Without Esophagitis    ALLERGIES: Allergies  Allergen Reactions  . Shellfish Allergy Anaphylaxis  . Gabapentin     rash    PAST MEDICAL HISTORY: Past Medical History  Diagnosis Date  . Fibromyalgia   . Chronic pain   . Seizures   . Migraine   . Pneumonia   . Pulmonary fibrosis   . Migraine without aura, with intractable migraine, so stated, without mention of status migrainosus 05/18/2014    MEDICATIONS AT HOME: Prior to Admission medications   Medication Sig Start Date End Date Taking? Authorizing Provider  Cyanocobalamin (B-12) 2000 MCG TABS Take 2,000 mcg by mouth daily. 09/10/14  Yes [name] Angst, MD  etodolac (LODINE) 500 MG tablet Take 1 tablet (500 mg total) by mouth 2 (two) times daily. 04/28/14  Yes [name] Cowden, MD  fentaNYL (DURAGESIC - DOSED MCG/HR) 50 MCG/HR Place 1 patch (50 mcg total) onto the  skin every 3 (three) days. 04/28/14  Yes [name] Cowden, MD  folic acid (FOLVITE) 1 MG tablet Take 1 tablet daily. 09/10/14  Yes [name] Angst, MD  furosemide (LASIX) 20 MG tablet Take 20 mg by mouth daily. 04/20/14 04/20/15 Yes Historical Provider, MD  levETIRAcetam (KEPPRA) 500 MG tablet Take 1 tablet (500 mg total) by mouth 2 (two) times daily. 06/02/14  Yes [name] Angst, MD  mycophenolate (CELLCEPT) 500 MG tablet Take 500 mg by mouth 2 (two) times daily. 04/28/14 04/28/15 Yes Historical Provider, MD  pantoprazole (PROTONIX) 40 MG tablet Take 1 tablet (40 mg total) by mouth 2 (two) times daily. 12/14/14  Yes [name] Angst, MD  polyethylene glycol (MIRALAX / GLYCOLAX) packet Take 17 g by mouth daily as needed for mild constipation or moderate constipation. 03/06/14  Yes [name] Finland, NP  predniSONE (DELTASONE) 20 MG tablet Take 2 tablets (40 mg total) by mouth daily with breakfast. 03/03/14  Yes [name] Cowden, MD  pregabalin (LYRICA) 300 MG capsule Take 300 mg by mouth 2 (two) times daily.   Yes Historical Provider, MD  rizatriptan (MAXALT) 10 MG tablet Take 10 mg by mouth daily as needed for migraine. May repeat in 2 hours if needed   Yes Historical Provider, MD  topiramate (TOPAMAX) 25 MG tablet Take 3 tablets (75 mg total) by mouth at bedtime. 11/06/14  Yes [name] Claris Gladden, NP  traMADol (ULTRAM) 50 MG tablet Take  1-2 tablets (50-100 mg total) by mouth every 4 (four) hours as needed (cough). 04/28/14  Yes [name] CowdenMichael B Wert, MD  Vitamin D, Ergocalciferol, (DRISDOL) 50000 UNITS CAPS capsule Take 1 capsule (50,000 Units total) by mouth every 7 (seven) days. Take on Saturday 12/14/14  Yes [name] Cumpian E Hyman HopesJegede, MD  albuterol (PROVENTIL HFA;VENTOLIN HFA) 108 (90 BASE) MCG/ACT inhaler Inhale 2 puffs into the lungs every 6 (six) hours as needed for wheezing or shortness of breath. Patient not taking: Reported on 12/14/2014 03/03/14   [name] CowdenMichael B Wert, MD  atorvastatin (LIPITOR) 40 MG tablet Take 1 tablet  (40 mg total) by mouth daily. Patient not taking: Reported on 12/14/2014 10/06/14   [name] Angstlugbemiga E Minah Axelrod, MD     Objective:   Filed Vitals:   12/14/14 0921  BP: 105/74  Pulse: 112  Temp: 98.4 F (36.9 C)  TempSrc: Oral  Height: 5\' 1"  (1.549 m)  Weight: 150 lb 12.8 oz (68.402 kg)    Exam General appearance : Awake, alert, not in any distress. Speech Clear. Not toxic looking HEENT: Atraumatic and Normocephalic, pupils equally reactive to light and accomodation Neck: supple, no JVD. No cervical lymphadenopathy.  Chest:Good air entry bilaterally, no added sounds  CVS: S1 S2 regular, no murmurs.  Abdomen: Bowel sounds present, Non tender and not distended with no gaurding, rigidity or rebound. Extremities: B/L Lower Ext shows no edema, both legs are warm to touch Neurology: Awake alert, and oriented X 3, CN II-XII intact, Non focal Skin:No Rash  Data Review Lab Results  Component Value Date   HGBA1C 5.6 09/10/2014     Assessment & Plan   1. Midline low back pain without sciatica  - Vit D  25 hydroxy (rtn osteoporosis monitoring)  2. Dental caries  - Ambulatory referral to Dentistry  3. Fibromyalgia  - Vitamin D, Ergocalciferol, (DRISDOL) 50000 UNITS CAPS capsule; Take 1 capsule (50,000 Units total) by mouth every 7 (seven) days. Take on Saturday  Dispense: 12 capsule; Refill: 0  4. Numbness and tingling  - MR Cervical Spine Wo Contrast; Future - COMPLETE METABOLIC PANEL WITH GFR  5. Gastroesophageal reflux disease without esophagitis  - pantoprazole (PROTONIX) 40 MG tablet; Take 1 tablet (40 mg total) by mouth 2 (two) times daily.  Dispense: 60 tablet; Refill: 6  Patient have been counseled extensively about nutrition and exercise Return in about 3 months (around 03/16/2015), or if symptoms worsen or fail to improve, for Follow up Pain and comorbidities, BOOP,, Annual Physical.  The patient was given clear instructions to go to ER or return to medical center if  symptoms don't improve, worsen or new problems develop. The patient verbalized understanding. The patient was told to call to get lab results if they haven't heard anything in the next week.   This note has been created with Education officer, environmentalDragon speech recognition software and smart phrase technology. Any transcriptional errors are unintentional.    Jeanann LewandowskyJEGEDE, Kaicee Scarpino, MD, MHA, CPE, FACP, FAAP [facility] [place], [place] [phone]   12/14/2014, 10:05 AM

## 2014-12-14 NOTE — Progress Notes (Signed)
Patient is here today's for a follow up of fibromyalgia and seizures. She state that she is being [facility]. Patient complains of lower lumbar back pain with a scale of 8/10. Patient has [facility] a [facility] list [facility].  patient also has requested a dental referral(she has Medicaid)

## 2014-12-15 LAB — VITAMIN D 25 HYDROXY (VIT D DEFICIENCY, FRACTURES): Vit D, 25-Hydroxy: 19 ng/mL — ABNORMAL LOW (ref 30–100)

## 2014-12-21 ENCOUNTER — Telehealth: Payer: Self-pay | Admitting: Internal Medicine

## 2014-12-21 NOTE — Telephone Encounter (Signed)
Pam from Pre Authorization center called requesting pre-auth for MRI that is scheduled for 12/22/14. Please f/u at [phone] ext [phone]

## 2014-12-22 ENCOUNTER — Ambulatory Visit (HOSPITAL_COMMUNITY): Payer: Medicaid Other

## 2014-12-25 ENCOUNTER — Telehealth: Payer: Self-pay | Admitting: *Deleted

## 2014-12-25 NOTE — Telephone Encounter (Signed)
-----   Message from Lestine Mountenise L Juarez, LPN sent at 1/1/91474/04/2015  2:13 PM EDT -----   ----- Message -----    From: Quentin Angstlugbemiga E Jegede, MD    Sent: 12/18/2014   5:44 PM      To: Lestine Mountenise L Juarez, LPN  Please inform patient that her vitamin D level is low, kidney function is stable. Continue vitamin D supplement as prescribed, we will check again in 3 months.

## 2014-12-25 NOTE — Telephone Encounter (Signed)
Left voice message to return call 

## 2014-12-28 DIAGNOSIS — N189 Chronic kidney disease, unspecified: Secondary | ICD-10-CM | POA: Insufficient documentation

## 2014-12-30 ENCOUNTER — Telehealth: Payer: Self-pay | Admitting: Internal Medicine

## 2014-12-30 NOTE — Telephone Encounter (Signed)
Pt returning nurse's call, please f/u with pt.   °

## 2014-12-30 NOTE — Telephone Encounter (Signed)
Pt stated MRI Neck was denied per insurance due to luck of information Pt will fax preview [facility] Lumbar [facility]

## 2015-01-04 ENCOUNTER — Ambulatory Visit (HOSPITAL_COMMUNITY): Payer: Medicaid Other

## 2015-03-05 ENCOUNTER — Other Ambulatory Visit: Payer: Self-pay | Admitting: Adult Health

## 2015-03-05 ENCOUNTER — Other Ambulatory Visit: Payer: Self-pay | Admitting: Neurology

## 2015-03-05 MED ORDER — TOPIRAMATE 25 MG PO TABS
75.0000 mg | ORAL_TABLET | Freq: Every day | ORAL | Status: DC
Start: 2015-03-05 — End: 2015-06-28

## 2015-03-05 NOTE — Telephone Encounter (Signed)
Patient paged the on call physician. I refilled Topamax for patient. thanks

## 2015-03-21 IMAGING — CR DG NECK SOFT TISSUE
1 series · 1 of 1 positions shown · non-contrast
Comparison: None.

CLINICAL DATA: Dysphagia.

EXAM:
NECK SOFT TISSUES - 1+ VIEW

[w soft tissue neck lat]
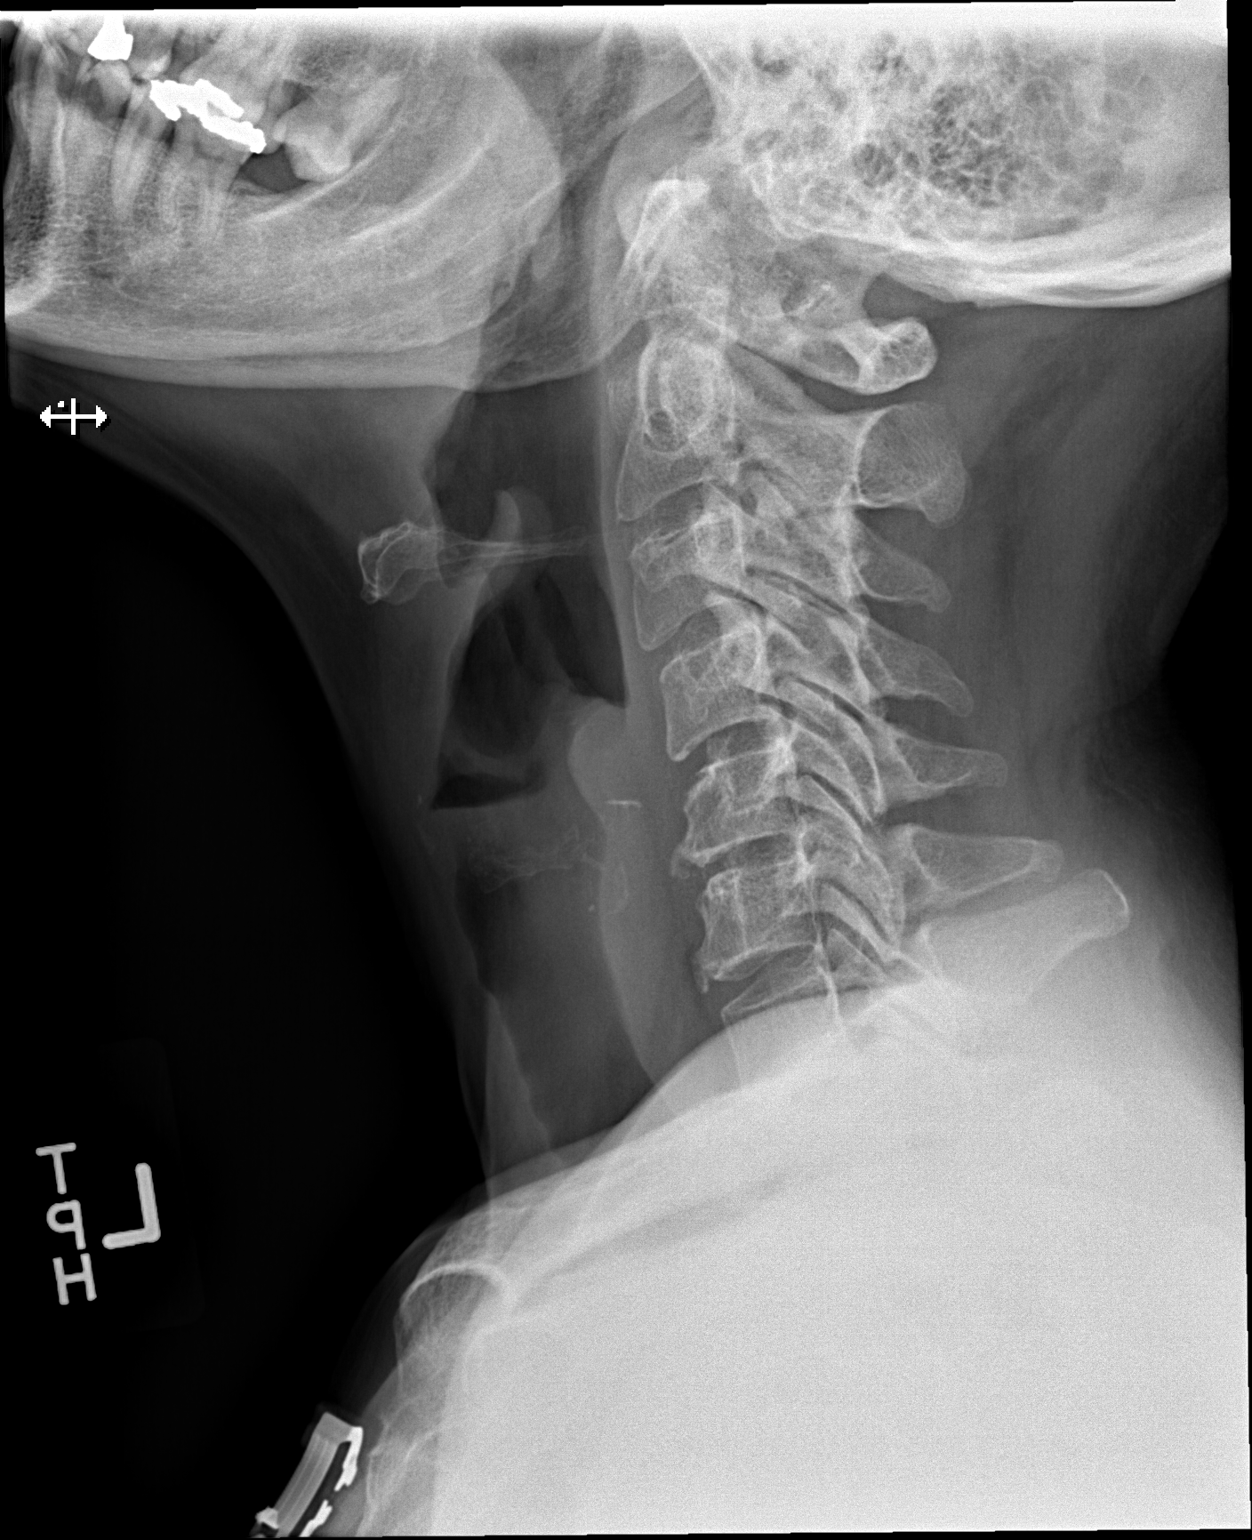

[1 of 1 positions shown; findings below may reference images not displayed]

FINDINGS: There is no evidence of retropharyngeal soft tissue swelling or
epiglottic enlargement. The cervical airway is unremarkable and no
radio-opaque foreign body identified. Tongue base is normal. Minimal
anterior osteophytes in the lower cervical spine. No disc space
narrowing.
IMPRESSION: No significant abnormality.

## 2015-03-28 IMAGING — CR DG ABD PORTABLE 1V
1 series · 1 of 1 positions shown · non-contrast
Comparison: Portable exam 8533 hr without priors for comparison.

CLINICAL DATA: Orogastric tube placement

EXAM:
PORTABLE ABDOMEN - 1 VIEW

[AP]
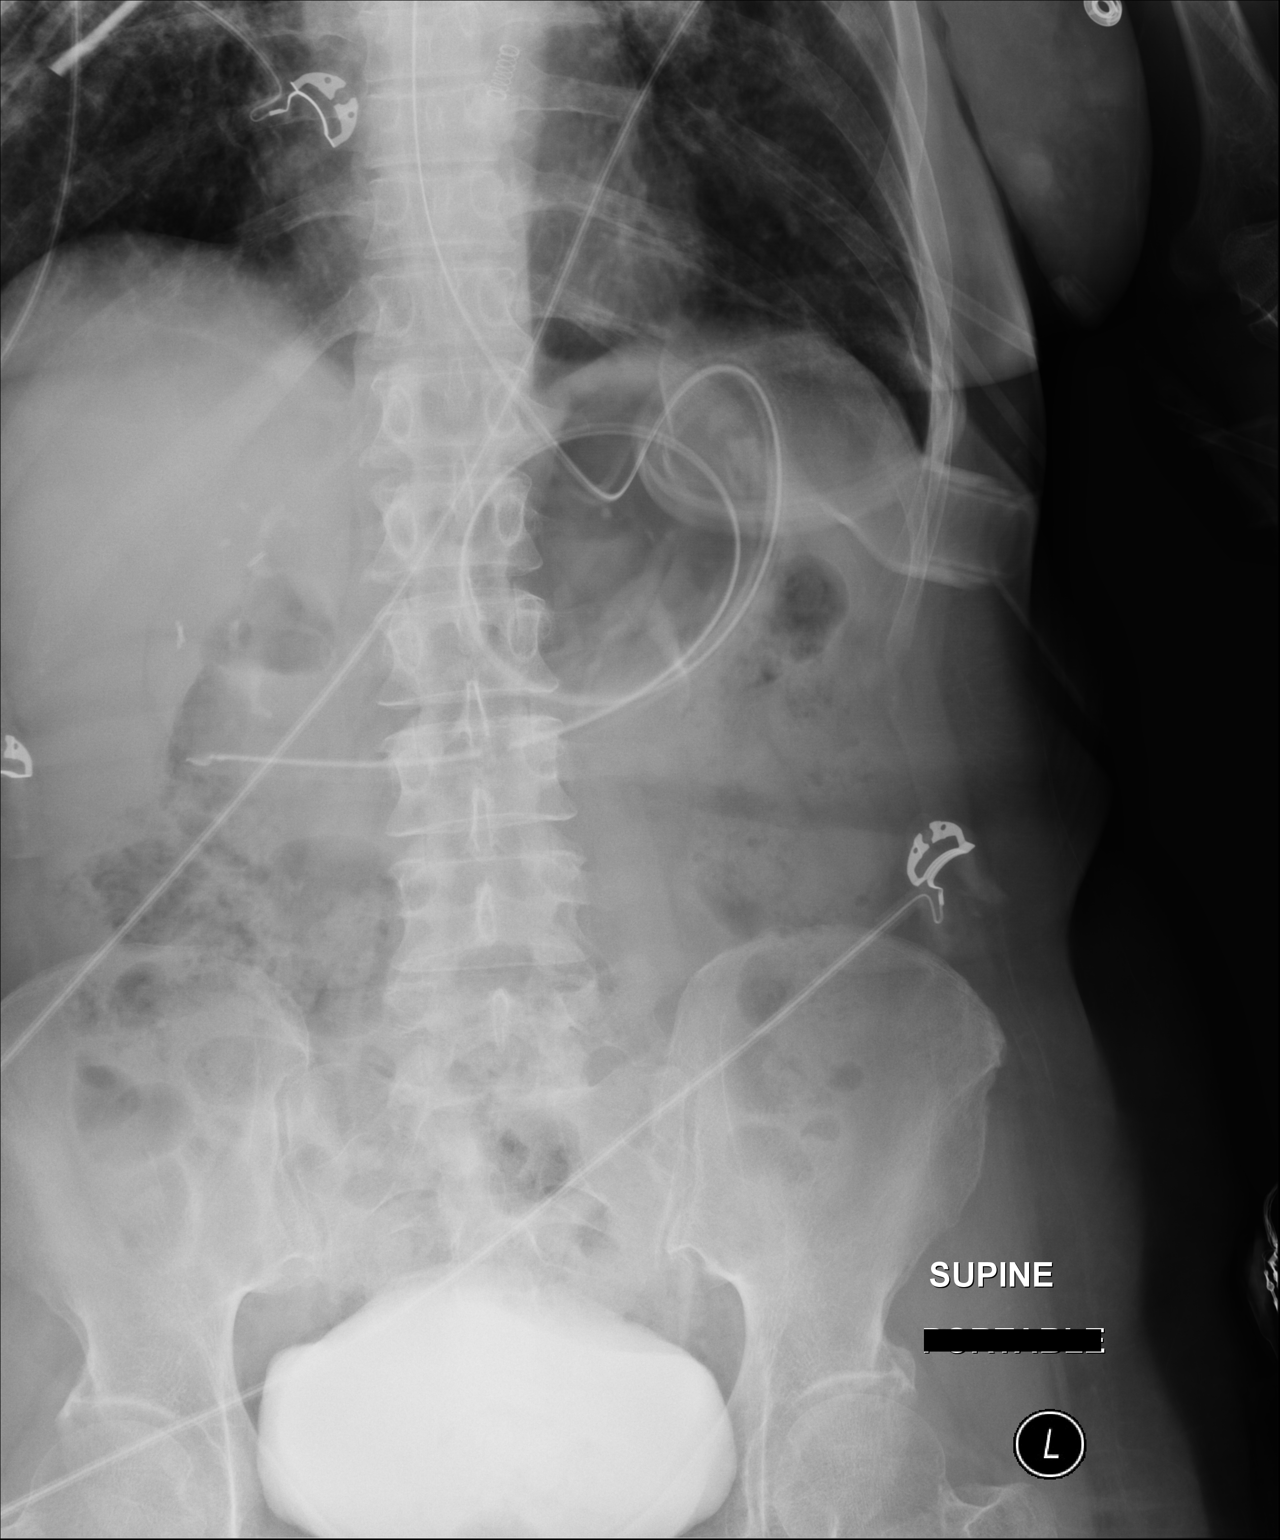

[1 of 1 positions shown; findings below may reference images not displayed]

FINDINGS: Orogastric tube is coiled in stomach with tip projecting over
approximate position of pylorus.

Nonobstructive bowel gas pattern.

No bowel dilatation or bowel wall thickening.

Excreted contrast within mildly distended urinary bladder and within
renal collecting systems.

Osseous structures unremarkable.

Lung bases clear.
IMPRESSION: Orogastric tube coiled in stomach.

## 2015-03-28 IMAGING — CT CT HEAD W/O CM
1 of 2 series · 13 of 30 positions shown, 17 images · non-contrast
Comparison: None.

CLINICAL DATA: Altered mental status.  Seizure.

EXAM:
CT HEAD WITHOUT CONTRAST
TECHNIQUE: Contiguous axial images were obtained from the base of the skull
through the vertex without intravenous contrast.

[Series 1: — · axial · 0.49mm/px · z∈[-293,-113]mm · 13 of 42 slices shown, 17 images]
[im 3/42  brain]
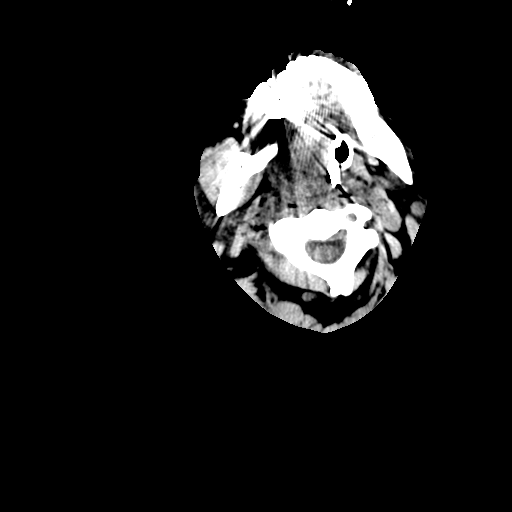
[im 3/42  bone]
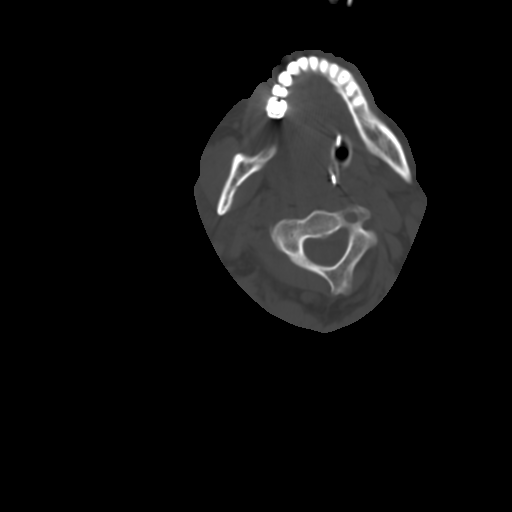
[im 6/42  brain]
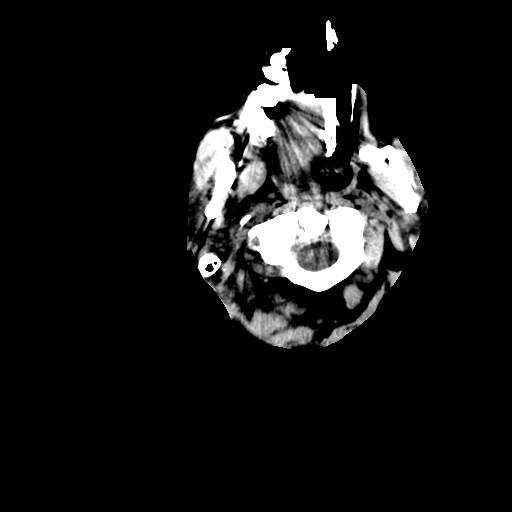
[im 9/42  brain]
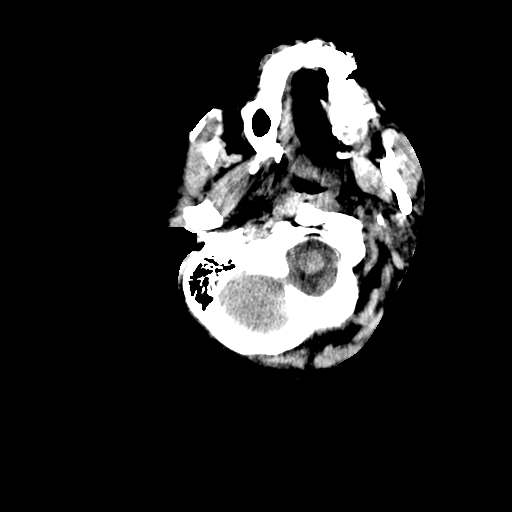
[im 12/42  brain]
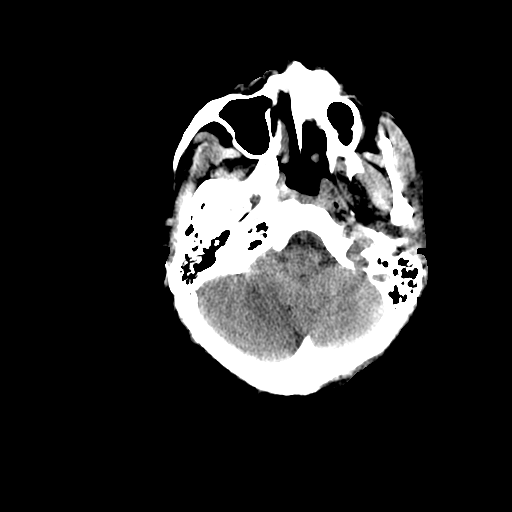
[im 15/42  brain]
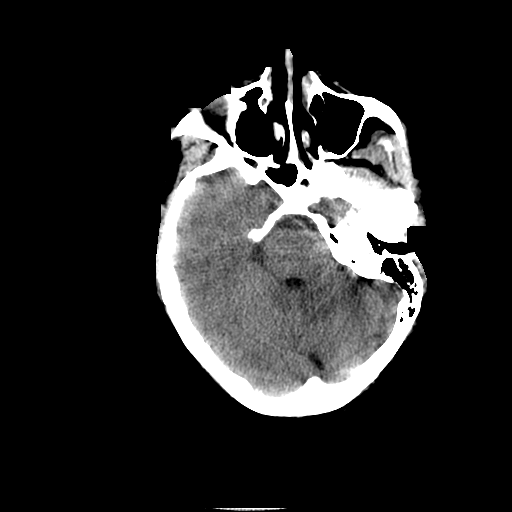
[im 15/42  bone]
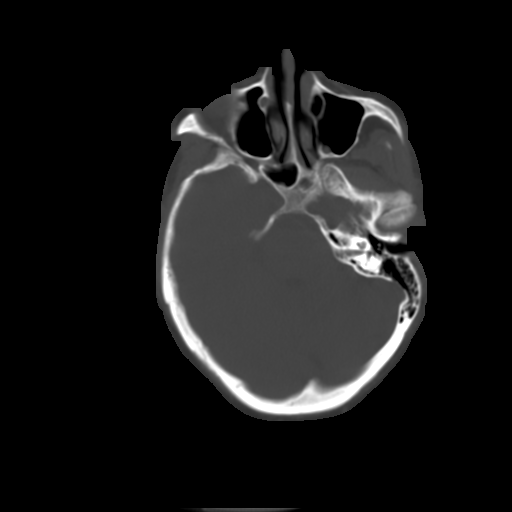
[im 18/42  brain]
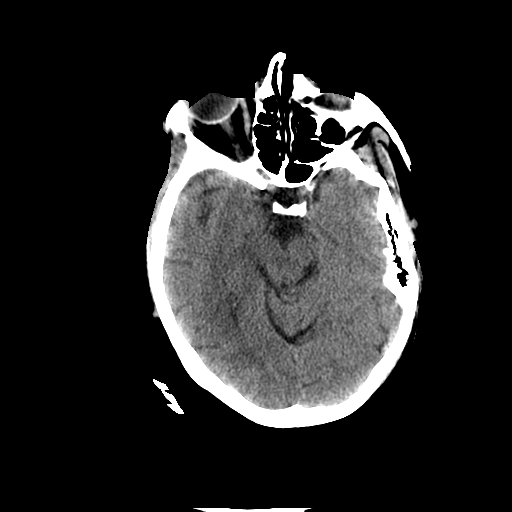
[im 21/42  brain]
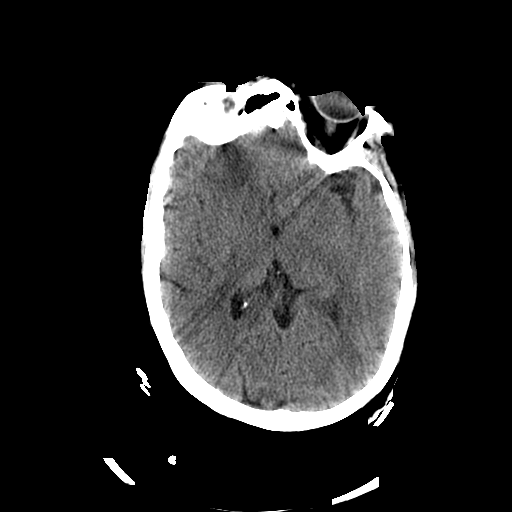
[im 24/42  brain]
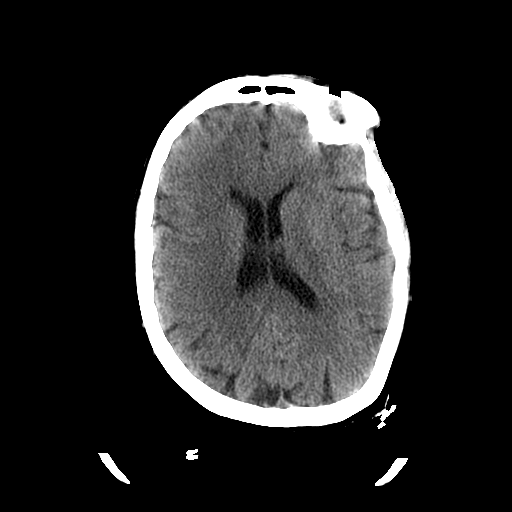
[im 27/42  brain]
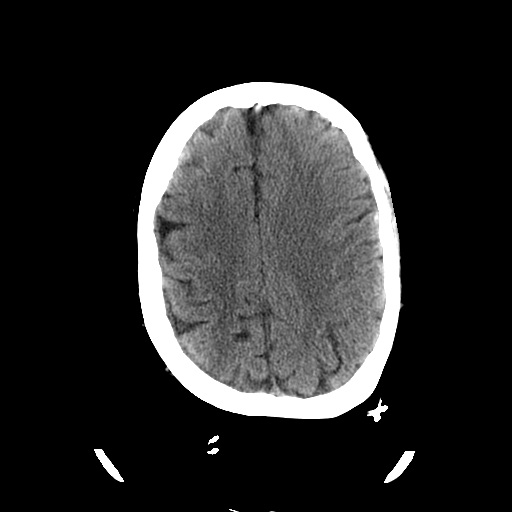
[im 27/42  bone]
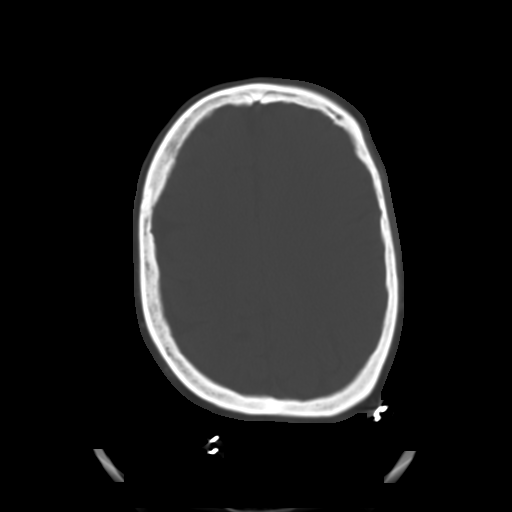
[im 30/42  brain]
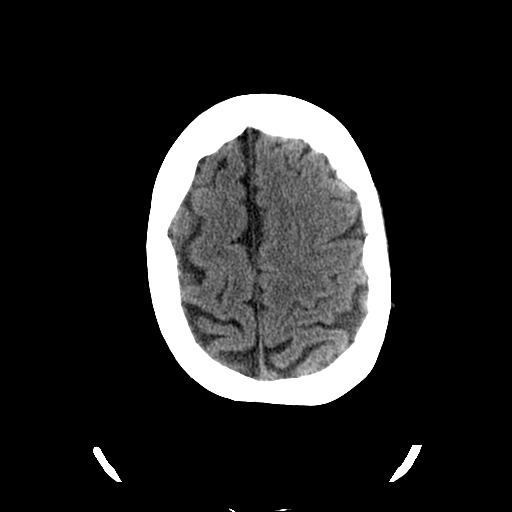
[im 33/42  brain]
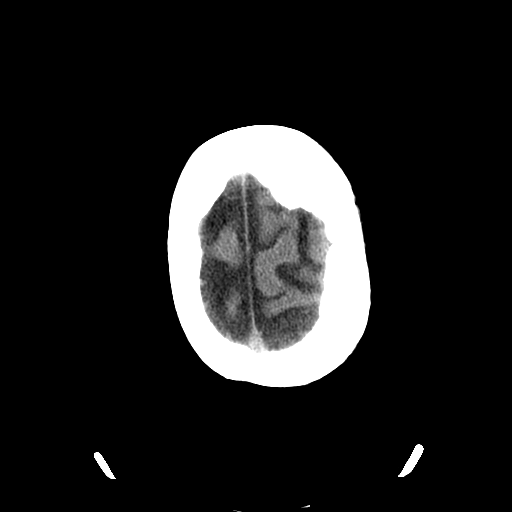
[im 36/42  brain]
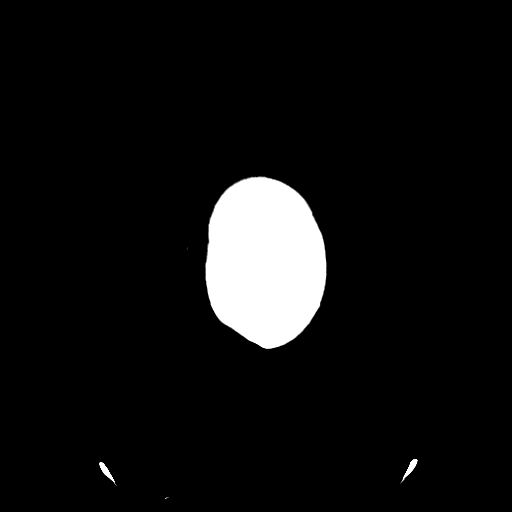
[im 39/42  brain]
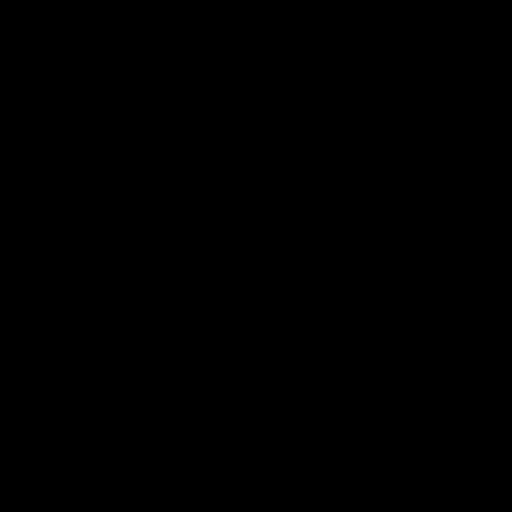
[im 39/42  bone]
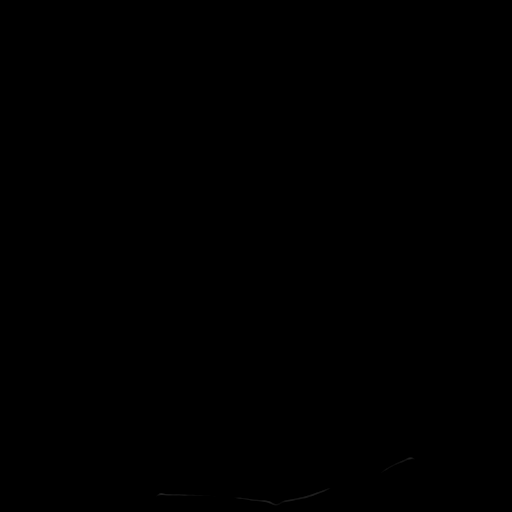

[13 of 30 positions shown; findings below may reference images not displayed]

FINDINGS: The patient is intubated.  An NG tube is in place.

No acute cortical infarct, hemorrhage, or mass lesion is present.
The ventricles are of normal size. No significant extra-axial fluid
collection is present. The paranasal sinuses and mastoid air cells
are clear. The osseous skull is intact.
IMPRESSION: Negative portable CT of the head.

## 2015-03-28 IMAGING — US US ABDOMEN COMPLETE
1 series · 13 of 25 positions shown · non-contrast
Comparison: None.

CLINICAL DATA: Epigastric pain, evaluate for pancreatitis.

EXAM:
ULTRASOUND ABDOMEN COMPLETE

[Series 1: us abdomen complete · 0.17mm/px · 13 of 88 slices shown]
[im 1/88]
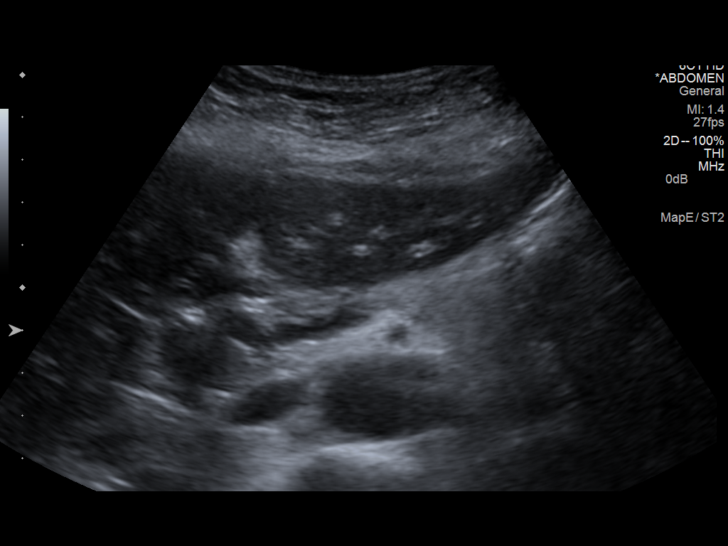
[im 8/88]
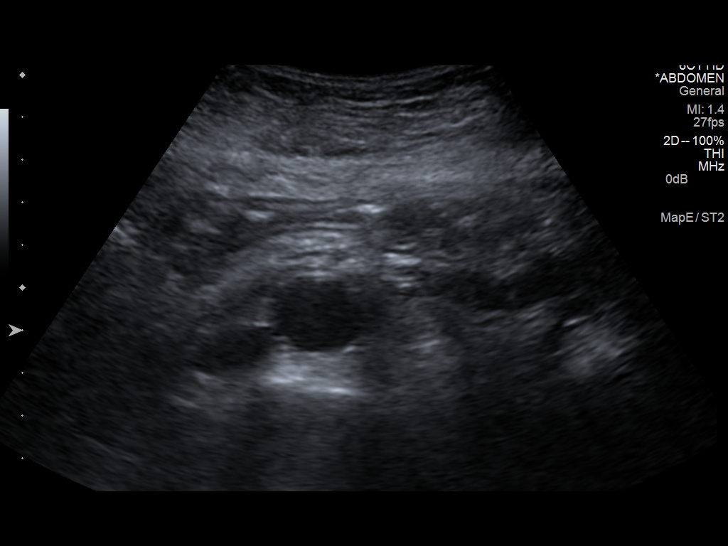
[im 15/88]
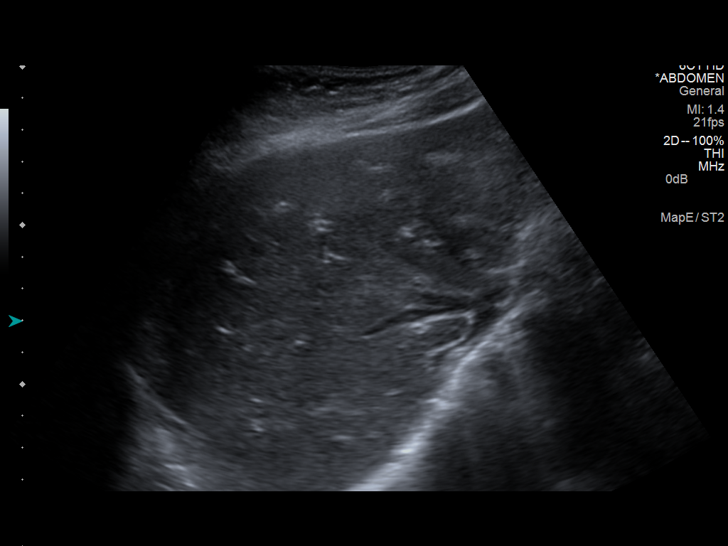
[im 22/88]
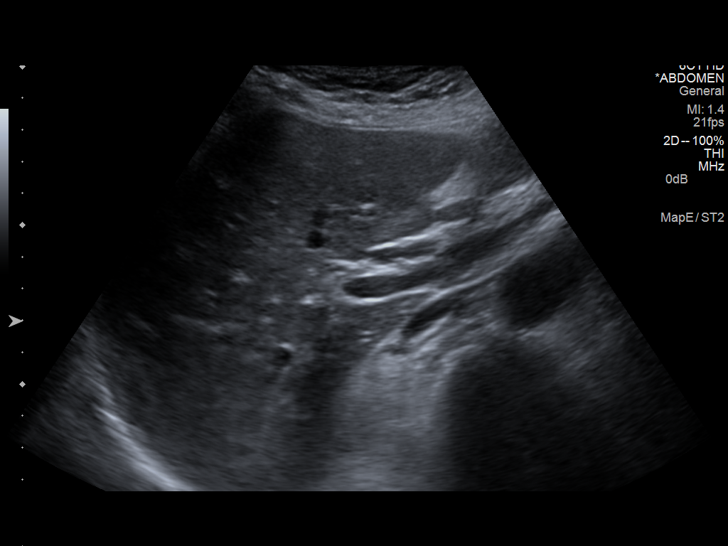
[im 30/88]
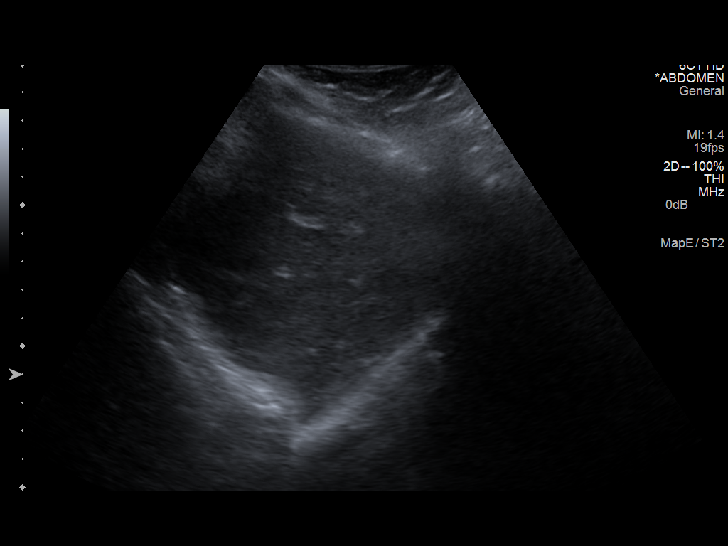
[im 37/88]
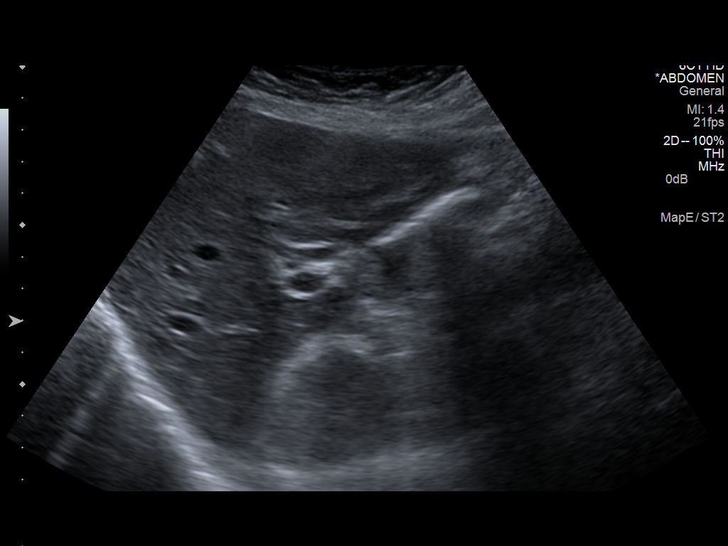
[im 44/88]
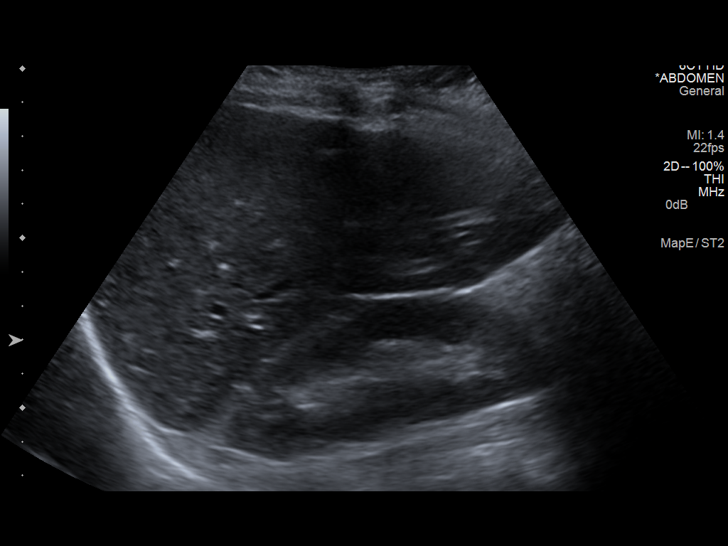
[im 51/88]
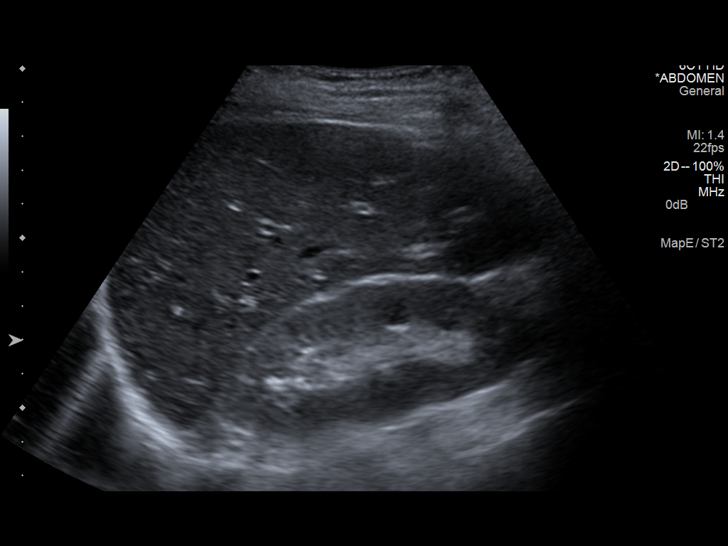
[im 59/88]
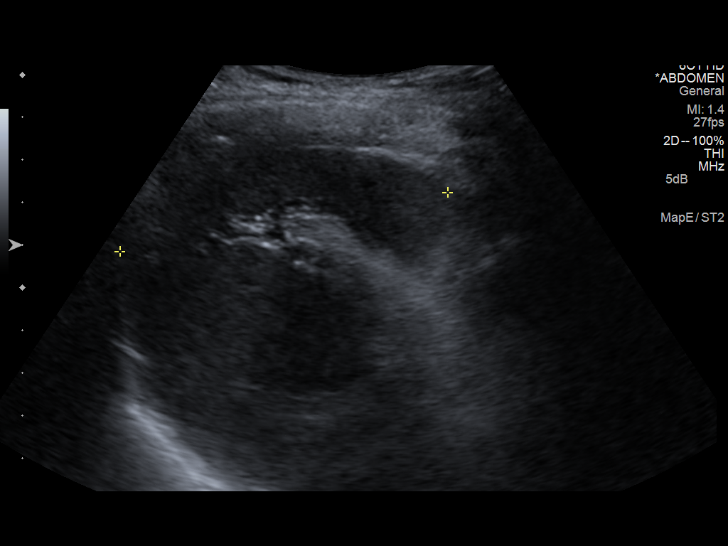
[im 66/88]
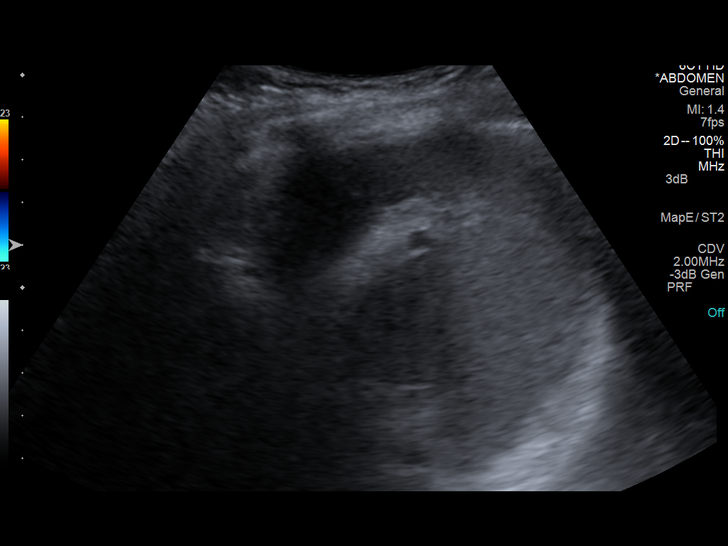
[im 73/88]
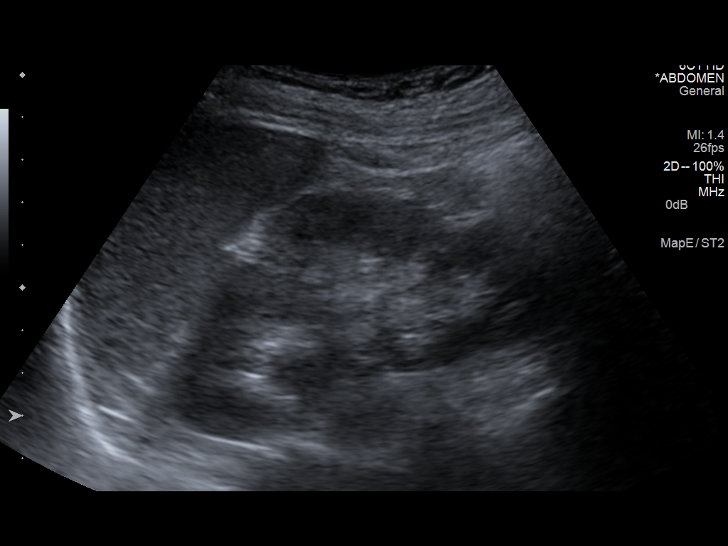
[im 80/88]
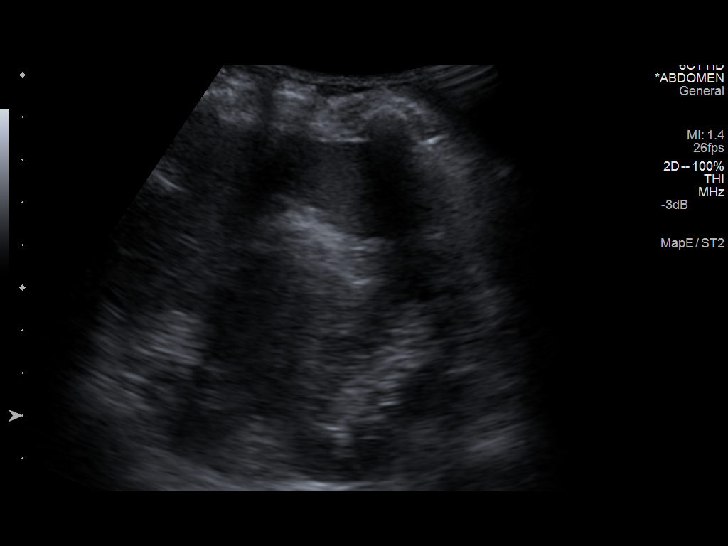
[im 88/88]
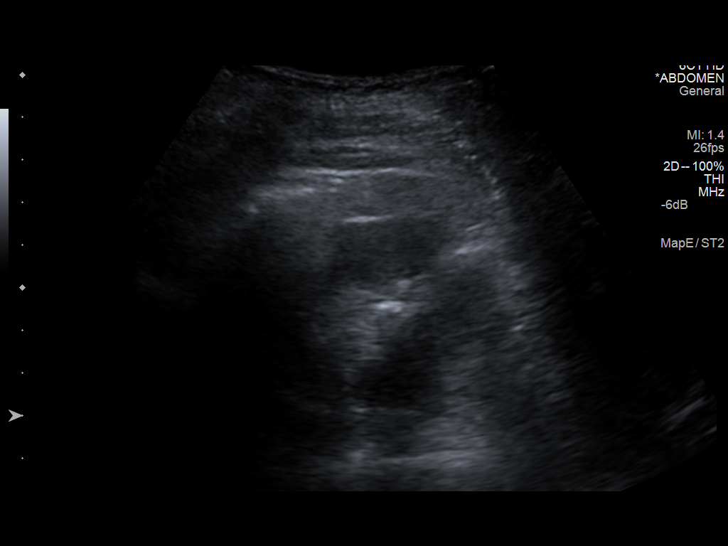

[13 of 25 positions shown; findings below may reference images not displayed]

FINDINGS: Gallbladder:

Surgically absent.

Common bile duct:

Diameter: 7 mm, which is upper normal to mildly prominent. The
distal duct near the ampulla is obscured by bowel gas artifact.

Liver:

No focal lesion identified. Within normal limits in parenchymal
echogenicity. There may be a slight intrahepatic biliary ductal
prominence.

IVC:

No abnormality visualized.

Pancreas:

Visualized portion unremarkable.

Spleen:

Measures 8 cm in oblique diameter. Nonspecific appearance to the
hilum.

Right Kidney:

Length: 10.2 cm.. Linear echogenic foci measuring up to 8 mm,
favored to reflect stones. No hydronephrosis.

Left Kidney:

Length: 10.6 cm pre. Linear echogenic foci measuring up to 6 mm,
favored to reflect stones. No hydronephrosis.

Abdominal aorta:

No aneurysm visualized.

Other findings:

None.
IMPRESSION: Mild intra and extrahepatic biliary ductal prominence is nonspecific
status post cholecystectomy. Correlate with LFTs and ERCP if
warranted.

No appreciable abnormality of the pancreas however note that
ultrasound has limited sensitivity in evaluating for pancreatitis
and associated complications.

Nonspecific appearance to the spleen.  Recommend CT.

Bilateral renal stones.  No hydronephrosis.

## 2015-05-06 ENCOUNTER — Ambulatory Visit: Payer: Medicaid Other | Attending: Internal Medicine | Admitting: Internal Medicine

## 2015-05-06 ENCOUNTER — Encounter: Payer: Self-pay | Admitting: Internal Medicine

## 2015-05-06 VITALS — BP 102/71 | HR 64 | Temp 98.4°F | Resp 18 | Ht 61.0 in | Wt 155.2 lb

## 2015-05-06 DIAGNOSIS — M545 Low back pain, unspecified: Secondary | ICD-10-CM

## 2015-05-06 DIAGNOSIS — M797 Fibromyalgia: Secondary | ICD-10-CM

## 2015-05-06 DIAGNOSIS — F4321 Adjustment disorder with depressed mood: Secondary | ICD-10-CM

## 2015-05-06 MED ORDER — TRAZODONE HCL 50 MG PO TABS: 50.0000 mg | ORAL_TABLET | Freq: Every evening | ORAL | Status: DC | PRN

## 2015-05-06 MED ORDER — PAROXETINE HCL 20 MG PO TABS: 20.0000 mg | ORAL_TABLET | Freq: Every day | ORAL | Status: DC

## 2015-05-06 NOTE — Progress Notes (Signed)
Patient ID: Anniebelle Devore, female   DOB: July 26, 1961, 54 y.o.   MRN: [id number]   Elysabeth Aust, is a 54 y.o. female  [id number]  [id number]  DOB - [birth year]  Chief Complaint  Patient presents with  . Follow-up        Subjective:   Danie Hannig is a 54 y.o. female here today for a follow up visit. Patient has history of seizure disorder, fibromyalgia chronic pain syndrome, pulmonary fibrosis following up with pulmonologist and chronic migraine with aura. Patient reports pain today located over entire body. Pain rated at a 9, described as sore and crampy. Pain comes and goes. Pain has been present for the past month. Pt reports the past two weeks it has been the worst. Pt reports lots of tingling in her fingers especially in her right hand. Pt reports she was not able to get MRI b/c reason for MRI was not specified. Patient is present in the clinic today with her mother was recently diagnosed with liver cancer status post surgery. Patient is depressed because she has to take care of herself on her sick mother and she feels overwhelmed at the moment. She follows up regularly with neurologist, pulmonologist and rheumatologist. Her current medications are listed below. She is tearful during this encounter. Patient has No headache, No chest pain, No abdominal pain - No Nausea, No new weakness tingling or numbness, No Cough - SOB.  No problems updated.  ALLERGIES: Allergies  Allergen Reactions  . Shellfish Allergy Anaphylaxis  . Gabapentin     rash    PAST MEDICAL HISTORY: Past Medical History  Diagnosis Date  . Fibromyalgia   . Chronic pain   . Seizures   . Migraine   . Pneumonia   . Pulmonary fibrosis   . Migraine without aura, with intractable migraine, so stated, without mention of status migrainosus 05/18/2014    MEDICATIONS AT HOME: Prior to Admission medications   Medication Sig Start Date End Date Taking? Authorizing Provider  albuterol (PROVENTIL HFA;VENTOLIN HFA) 108  (90 BASE) MCG/ACT inhaler Inhale 2 puffs into the lungs every 6 (six) hours as needed for wheezing or shortness of breath. 03/03/14  Yes Nyoka Cowden, MD  Cyanocobalamin (B-12) 2000 MCG TABS Take 2,000 mcg by mouth daily. 09/10/14  Yes Quentin Angst, MD  etodolac (LODINE) 500 MG tablet Take 1 tablet (500 mg total) by mouth 2 (two) times daily. 04/28/14  Yes Nyoka Cowden, MD  fentaNYL (DURAGESIC - DOSED MCG/HR) 50 MCG/HR Place 1 patch (50 mcg total) onto the skin every 3 (three) days. 04/28/14  Yes Nyoka Cowden, MD  folic acid (FOLVITE) 1 MG tablet Take 1 tablet daily. 09/10/14  Yes Quentin Angst, MD  levETIRAcetam (KEPPRA) 500 MG tablet Take 1 tablet (500 mg total) by mouth 2 (two) times daily. 06/02/14  Yes Quentin Angst, MD  pantoprazole (PROTONIX) 40 MG tablet Take 1 tablet (40 mg total) by mouth 2 (two) times daily. 12/14/14  Yes Quentin Angst, MD  polyethylene glycol (MIRALAX / GLYCOLAX) packet Take 17 g by mouth daily as needed for mild constipation or moderate constipation. 03/06/14  Yes Ambrose Finland, NP  predniSONE (DELTASONE) 20 MG tablet Take 2 tablets (40 mg total) by mouth daily with breakfast. Patient taking differently: Take 40 mg by mouth daily with breakfast.  03/03/14  Yes Nyoka Cowden, MD  pregabalin (LYRICA) 300 MG capsule Take 300 mg by mouth 2 (two) times daily.   Yes Historical Provider,  MD  rizatriptan (MAXALT) 10 MG tablet Take 10 mg by mouth daily as needed for migraine. May repeat in 2 hours if needed   Yes Historical Provider, MD  topiramate (TOPAMAX) 25 MG tablet Take 3 tablets (75 mg total) by mouth at bedtime. 03/05/15  Yes Anson Fret, MD  traMADol (ULTRAM) 50 MG tablet Take 1-2 tablets (50-100 mg total) by mouth every 4 (four) hours as needed (cough). 04/28/14  Yes Nyoka Cowden, MD  Vitamin D, Ergocalciferol, (DRISDOL) 50000 UNITS CAPS capsule Take 1 capsule (50,000 Units total) by mouth every 7 (seven) days. Take on Saturday 12/14/14  Yes  Quentin Angst, MD  atorvastatin (LIPITOR) 40 MG tablet Take 1 tablet (40 mg total) by mouth daily. Patient not taking: Reported on 12/14/2014 10/06/14   Quentin Angst, MD  furosemide (LASIX) 20 MG tablet Take 20 mg by mouth daily. 04/20/14 04/20/15  Historical Provider, MD  PARoxetine (PAXIL) 20 MG tablet Take 1 tablet (20 mg total) by mouth daily. 05/06/15   Quentin Angst, MD  traZODone (DESYREL) 50 MG tablet Take 1 tablet (50 mg total) by mouth at bedtime as needed for sleep. 05/06/15   Quentin Angst, MD     Objective:   Filed Vitals:   05/06/15 1400 05/06/15 1403  BP:  102/71  Pulse:  64  Temp:  98.4 F (36.9 C)  TempSrc:  Oral  Resp:  18  Height:  (1.549 m)   Weight: 155 lb 3.2 oz (70.398 kg)   SpO2:  100%    Exam General appearance : Awake, alert, not in any distress. Speech Clear. Not toxic looking HEENT: Atraumatic and Normocephalic, pupils equally reactive to light and accomodation Neck: supple, no JVD. No cervical lymphadenopathy.  Chest:Good air entry bilaterally, no added sounds  CVS: S1 S2 regular, no murmurs.  Abdomen: Bowel sounds present, Non tender and not distended with no gaurding, rigidity or rebound. Extremities: B/L Lower Ext shows no edema, both legs are warm to touch Neurology: Awake alert, and oriented X 3, CN II-XII intact, Non focal Skin:No Rash  Data Review Lab Results  Component Value Date   HGBA1C 5.6 09/10/2014     Assessment & Plan   1. Midline low back pain without sciatica Continue pain medications  2. Fibromyalgia  Continue current management  3. Adjustment disorder with depressed mood  - CBC with Differential/Platelet - COMPLETE METABOLIC PANEL WITH GFR - Vit D  25 hydroxy (rtn osteoporosis monitoring) - Urinalysis, Complete Prescribed: - PARoxetine (PAXIL) 20 MG tablet; Take 1 tablet (20 mg total) by mouth daily.  Dispense: 30 tablet; Refill: 3 - traZODone (DESYREL) 50 MG tablet; Take 1 tablet (50 mg  total) by mouth at bedtime as needed for sleep.  Dispense: 30 tablet; Refill: 3  - Ambulatory referral to Social Work for counseling and supportive care - Ambulatory referral to Psychiatry  Patient have been counseled extensively about nutrition and exercise Return in about 4 weeks (around 06/03/2015) for Follow up Pain and comorbidities.  The patient was given clear instructions to go to ER or return to medical center if symptoms don't improve, worsen or new problems develop. The patient verbalized understanding. The patient was told to call to get lab results if they haven't heard anything in the next week.   This note has been created with Education officer, environmental. Any transcriptional errors are unintentional.    Arron Mcnaught, MD, MHA, CPE, FACP, FAAP [place]  [facility] [place], [place] [phone]   05/06/2015, 3:36 PM

## 2015-05-06 NOTE — Progress Notes (Signed)
Patient here for F/U. Patient reports pain today located over entire body. Pain rated at a 9, described as sore and crampy. Pain comes and goes. Pain has been present for the past month. Pt reports the past two weeks it has been the worst.  Pt reports lots of tingling in her fingers especially in her right hand.   Pt reports she was not able to get MRI b/c reason for MRI was not specified.   Pt has taken her morning medications today and does not recall needing any refills.

## 2015-05-06 NOTE — Patient Instructions (Addendum)
Chronic Back Pain  When back pain lasts longer than 3 months, it is called chronic back pain.People with chronic back pain often go through certain periods that are more intense (flare-ups).  CAUSES Chronic back pain can be caused by wear and tear (degeneration) on different structures in your back. These structures include: 1. The bones of your spine (vertebrae) and the joints surrounding your spinal cord and nerve roots (facets). 2. The strong, fibrous tissues that connect your vertebrae (ligaments). Degeneration of these structures may result in pressure on your nerves. This can lead to constant pain. HOME CARE INSTRUCTIONS  Avoid bending, heavy lifting, prolonged sitting, and activities which make the problem worse.  Take brief periods of rest throughout the day to reduce your pain. Lying down or standing usually is better than sitting while you are resting.  Take over-the-counter or prescription medicines only as directed by your caregiver. SEEK IMMEDIATE MEDICAL CARE IF:   You have weakness or numbness in one of your legs or feet.  You have trouble controlling your bladder or bowels.  You have nausea, vomiting, abdominal pain, shortness of breath, or fainting. Document Released: 10/12/2004 Document Revised: 11/27/2011 Document Reviewed: 08/19/2011 [facility] Patient Information 2015 [place], [place]. This information is not intended to replace advice given to you by your health care provider. Make sure you discuss any questions you have with your health care provider. Fibromyalgia Fibromyalgia is a disorder that is often misunderstood. It is associated with muscular pains and tenderness that comes and goes. It is often associated with fatigue and sleep disturbances. Though it tends to be long-lasting, fibromyalgia is not life-threatening. CAUSES  The exact cause of fibromyalgia is unknown. People with certain gene types are predisposed to developing fibromyalgia and other conditions.  Certain factors can play a role as triggers, such as: 3. Spine disorders. 4. Arthritis. 5. Severe injury (trauma) and other physical stressors. 6. Emotional stressors. SYMPTOMS   The main symptom is pain and stiffness in the muscles and joints, which can vary over time.  Sleep and fatigue problems. Other related symptoms may include:  Bowel and bladder problems.  Headaches.  Visual problems.  Problems with odors and noises.  Depression or mood changes.  Painful periods (dysmenorrhea).  Dryness of the skin or eyes. DIAGNOSIS  There are no specific tests for diagnosing fibromyalgia. Patients can be diagnosed accurately from the specific symptoms they have. The diagnosis is made by determining that nothing else is causing the problems. TREATMENT  There is no cure. Management includes medicines and an active, healthy lifestyle. The goal is to enhance physical fitness, decrease pain, and improve sleep. HOME CARE INSTRUCTIONS   Only take over-the-counter or prescription medicines as directed by your caregiver. Sleeping pills, tranquilizers, and pain medicines may make your problems worse.  Low-impact aerobic exercise is very important and advised for treatment. At first, it may seem to make pain worse. Gradually increasing your tolerance will overcome this feeling.  Learning relaxation techniques and how to control stress will help you. Biofeedback, visual imagery, hypnosis, muscle relaxation, yoga, and meditation are all options.  Anti-inflammatory medicines and physical therapy may provide short-term help.  Acupuncture or massage treatments may help.  Take muscle relaxant medicines as suggested by your caregiver.  Avoid stressful situations.  Plan a healthy lifestyle. This includes your diet, sleep, rest, exercise, and friends.  Find and practice a hobby you enjoy.  Join a fibromyalgia support group for interaction, ideas, and sharing advice. This may be helpful. SEEK  MEDICAL  CARE IF:  You are not having good results or improvement from your treatment. FOR MORE INFORMATION  National Fibromyalgia Association: www.fmaware.org Arthritis Foundation: www.arthritis.org Document Released: 09/04/2005 Document Revised: 11/27/2011 Document Reviewed: 12/15/2009 [facility] Patient Information 2015 [place], [place]. This information is not intended to replace advice given to you by your health care provider. Make sure you discuss any questions you have with your health care provider. Depression Depression refers to feeling sad, low, down in the dumps, blue, gloomy, or empty. In general, there are two kinds of depression: 7. Normal sadness or normal grief. This kind of depression is one that we all feel from time to time after upsetting life experiences, such as the loss of a job or the ending of a relationship. This kind of depression is considered normal, is short lived, and resolves within a few days to 2 weeks. Depression experienced after the loss of a loved one (bereavement) often lasts longer than 2 weeks but normally gets better with time. 8. Clinical depression. This kind of depression lasts longer than normal sadness or normal grief or interferes with your ability to function at home, at work, and in school. It also interferes with your personal relationships. It affects almost every aspect of your life. Clinical depression is an illness. Symptoms of depression can also be caused by conditions other than those mentioned above, such as:  Physical illness. Some physical illnesses, including underactive thyroid gland (hypothyroidism), severe anemia, specific types of cancer, diabetes, uncontrolled seizures, heart and lung problems, strokes, and chronic pain are commonly associated with symptoms of depression.  Side effects of some prescription medicine. In some people, certain types of medicine can cause symptoms of depression.  Substance abuse. Abuse of alcohol and  illicit drugs can cause symptoms of depression. SYMPTOMS Symptoms of normal sadness and normal grief include the following:  Feeling sad or crying for short periods of time.  Not caring about anything (apathy).  Difficulty sleeping or sleeping too much.  No longer able to enjoy the things you used to enjoy.  Desire to be by oneself all the time (social isolation).  Lack of energy or motivation.  Difficulty concentrating or remembering.  Change in appetite or weight.  Restlessness or agitation. Symptoms of clinical depression include the same symptoms of normal sadness or normal grief and also the following symptoms:  Feeling sad or crying all the time.  Feelings of guilt or worthlessness.  Feelings of hopelessness or helplessness.  Thoughts of suicide or the desire to harm yourself (suicidal ideation).  Loss of touch with reality (psychotic symptoms). Seeing or hearing things that are not real (hallucinations) or having false beliefs about your life or the people around you (delusions and paranoia). DIAGNOSIS  The diagnosis of clinical depression is usually based on how bad the symptoms are and how long they have lasted. Your health care provider will also ask you questions about your medical history and substance use to find out if physical illness, use of prescription medicine, or substance abuse is causing your depression. Your health care provider may also order blood tests. TREATMENT  Often, normal sadness and normal grief do not require treatment. However, sometimes antidepressant medicine is given for bereavement to ease the depressive symptoms until they resolve. The treatment for clinical depression depends on how bad the symptoms are but often includes antidepressant medicine, counseling with a mental health professional, or both. Your health care provider will help to determine what treatment is best for you. Depression caused by physical  illness usually goes away with  appropriate medical treatment of the illness. If prescription medicine is causing depression, talk with your health care provider about stopping the medicine, decreasing the dose, or changing to another medicine. Depression caused by the abuse of alcohol or illicit drugs goes away when you stop using these substances. Some adults need professional help in order to stop drinking or using drugs. SEEK IMMEDIATE MEDICAL CARE IF:  You have thoughts about hurting yourself or others.  You lose touch with reality (have psychotic symptoms).  You are taking medicine for depression and have a serious side effect. FOR MORE INFORMATION  National Alliance on Mental Illness: www.nami.[employer] of Mental Health: http://www.maynard.net/ Document Released: 09/01/2000 Document Revised: 01/19/2014 Document Reviewed: 12/04/2011 [facility] Patient Information 2015 [place], [place]. This information is not intended to replace advice given to you by your health care provider. Make sure you discuss any questions you have with your health care provider.

## 2015-05-07 ENCOUNTER — Telehealth: Payer: Self-pay | Admitting: Clinical

## 2015-05-07 LAB — COMPLETE METABOLIC PANEL WITH GFR
ALT: 10 U/L (ref 6–29)
AST: 18 U/L (ref 10–35)
Albumin: 4 g/dL (ref 3.6–5.1)
Alkaline Phosphatase: 177 U/L — ABNORMAL HIGH (ref 33–130)
BILIRUBIN TOTAL: 0.8 mg/dL (ref 0.2–1.2)
BUN: 10 mg/dL (ref 7–25)
CO2: 21 mmol/L (ref 20–31)
Calcium: 9.5 mg/dL (ref 8.6–10.4)
Chloride: 108 mmol/L (ref 98–110)
Creat: 1.23 mg/dL — ABNORMAL HIGH (ref 0.50–1.05)
GFR, EST AFRICAN AMERICAN: 58 mL/min — AB (ref >=60)
GFR, Est Non African American: 50 mL/min — ABNORMAL LOW (ref >=60)
Glucose, Bld: 87 mg/dL (ref 65–99)
Potassium: 3.7 mmol/L (ref 3.5–5.3)
Sodium: 143 mmol/L (ref 135–146)
TOTAL PROTEIN: 5.8 g/dL — AB (ref 6.1–8.1)

## 2015-05-07 LAB — CBC WITH DIFFERENTIAL/PLATELET
Basophils Absolute: 0 10*3/uL (ref 0.0–0.1)
Basophils Relative: 0 % (ref 0–1)
EOS ABS: 0 10*3/uL (ref 0.0–0.7)
EOS PCT: 0 % (ref 0–5)
HCT: 38.4 % (ref 36.0–46.0)
Hemoglobin: 11.9 g/dL — ABNORMAL LOW (ref 12.0–15.0)
LYMPHS ABS: 1.6 10*3/uL (ref 0.7–4.0)
Lymphocytes Relative: 20 % (ref 12–46)
MCH: 24.6 pg — AB (ref 26.0–34.0)
MCHC: 31 g/dL (ref 30.0–36.0)
MCV: 79.3 fL (ref 78.0–100.0)
MONOS PCT: 9 % (ref 3–12)
Monocytes Absolute: 0.7 10*3/uL (ref 0.1–1.0)
Neutro Abs: 5.8 10*3/uL (ref 1.7–7.7)
Neutrophils Relative %: 71 % (ref 43–77)
Platelets: 172 10*3/uL (ref 150–400)
RBC: 4.84 MIL/uL (ref 3.87–5.11)
RDW: 15.4 % (ref 11.5–15.5)
WBC: 8.1 10*3/uL (ref 4.0–10.5)

## 2015-05-07 LAB — URINALYSIS, COMPLETE
BACTERIA UA: NONE SEEN [HPF]
Casts: NONE SEEN [LPF]
Crystals: NONE SEEN [HPF]
GLUCOSE, UA: NEGATIVE
Ketones, ur: NEGATIVE
NITRITE: NEGATIVE
PH: 5.5 (ref 5.0–8.0)
Specific Gravity, Urine: 1.023 (ref 1.001–1.035)
Yeast: NONE SEEN [HPF]

## 2015-05-07 LAB — VITAMIN D 25 HYDROXY (VIT D DEFICIENCY, FRACTURES): VIT D 25 HYDROXY: 18 ng/mL — AB (ref 30–100)

## 2015-05-07 NOTE — Telephone Encounter (Signed)
Left HIPPA-compliant message to return call to Denia Mcvicar from [employer] at [phone].  

## 2015-05-13 ENCOUNTER — Other Ambulatory Visit: Payer: Medicaid Other

## 2015-05-25 ENCOUNTER — Telehealth: Payer: Self-pay | Admitting: *Deleted

## 2015-05-25 NOTE — Telephone Encounter (Signed)
-----   Message from Quentin Angst, MD sent at 05/07/2015  5:27 PM EDT ----- Please inform patient that her laboratory test results are largely stable when compared to previous results, vitamin D level is still low advised patient to continue vitamin D as prescribed. White Cell count is now normal.

## 2015-05-25 NOTE — Telephone Encounter (Signed)
Verified name and date of birth and told patient that her laboratory test results are largely stable when compared to previous results, vitamin D level is still low advised patient to continue vitamin D as prescribed. White Cell count is now normal.  Patient confirmed she had an appointment to see Dr. Hyman Hopes on 9/18 and that she would discuss results with him then.

## 2015-06-03 ENCOUNTER — Ambulatory Visit: Payer: Medicaid Other | Admitting: Internal Medicine

## 2015-06-28 ENCOUNTER — Ambulatory Visit: Payer: Medicaid Other | Attending: Internal Medicine | Admitting: Internal Medicine

## 2015-06-28 VITALS — BP 105/74 | HR 54 | Temp 98.3°F | Resp 18 | Ht 61.0 in | Wt 145.6 lb

## 2015-06-28 DIAGNOSIS — Z91013 Allergy to seafood: Secondary | ICD-10-CM | POA: Insufficient documentation

## 2015-06-28 DIAGNOSIS — M797 Fibromyalgia: Secondary | ICD-10-CM | POA: Insufficient documentation

## 2015-06-28 DIAGNOSIS — R569 Unspecified convulsions: Secondary | ICD-10-CM | POA: Insufficient documentation

## 2015-06-28 DIAGNOSIS — E785 Hyperlipidemia, unspecified: Secondary | ICD-10-CM | POA: Diagnosis not present

## 2015-06-28 DIAGNOSIS — Z1231 Encounter for screening mammogram for malignant neoplasm of breast: Secondary | ICD-10-CM

## 2015-06-28 DIAGNOSIS — G43809 Other migraine, not intractable, without status migrainosus: Secondary | ICD-10-CM

## 2015-06-28 DIAGNOSIS — G43909 Migraine, unspecified, not intractable, without status migrainosus: Secondary | ICD-10-CM | POA: Insufficient documentation

## 2015-06-28 DIAGNOSIS — K219 Gastro-esophageal reflux disease without esophagitis: Secondary | ICD-10-CM | POA: Diagnosis not present

## 2015-06-28 DIAGNOSIS — Z1211 Encounter for screening for malignant neoplasm of colon: Secondary | ICD-10-CM

## 2015-06-28 DIAGNOSIS — M549 Dorsalgia, unspecified: Secondary | ICD-10-CM | POA: Insufficient documentation

## 2015-06-28 DIAGNOSIS — J8410 Pulmonary fibrosis, unspecified: Secondary | ICD-10-CM | POA: Diagnosis not present

## 2015-06-28 DIAGNOSIS — Z79899 Other long term (current) drug therapy: Secondary | ICD-10-CM | POA: Insufficient documentation

## 2015-06-28 MED ORDER — RIZATRIPTAN BENZOATE 10 MG PO TABS: 10.0000 mg | ORAL_TABLET | Freq: Every day | ORAL | Status: DC | PRN

## 2015-06-28 MED ORDER — PANTOPRAZOLE SODIUM 40 MG PO TBEC: 40.0000 mg | DELAYED_RELEASE_TABLET | Freq: Three times a day (TID) | ORAL | Status: DC

## 2015-06-28 MED ORDER — TOPIRAMATE 25 MG PO TABS: 75.0000 mg | ORAL_TABLET | Freq: Every day | ORAL | Status: DC

## 2015-06-28 MED ORDER — ALBUTEROL SULFATE HFA 108 (90 BASE) MCG/ACT IN AERS: 2.0000 | INHALATION_SPRAY | Freq: Four times a day (QID) | RESPIRATORY_TRACT | Status: DC | PRN

## 2015-06-28 MED ORDER — LEVETIRACETAM 500 MG PO TABS: 500.0000 mg | ORAL_TABLET | Freq: Two times a day (BID) | ORAL | Status: DC

## 2015-06-28 MED ORDER — B-12 2000 MCG PO TABS: 2000.0000 ug | ORAL_TABLET | Freq: Every day | ORAL | Status: DC

## 2015-06-28 MED ORDER — FOLIC ACID 1 MG PO TABS
ORAL_TABLET | ORAL | Status: DC
Start: 2015-06-28 — End: 2016-02-17

## 2015-06-28 MED ORDER — ATORVASTATIN CALCIUM 40 MG PO TABS: 40.0000 mg | ORAL_TABLET | Freq: Every day | ORAL | Status: DC

## 2015-06-28 NOTE — Progress Notes (Signed)
Patient ID: [name] [name], female   DOB: 1961-04-30, 54 y.o.   MRN: [id number]   [name] [name], is a 54 y.o. female  [id number]  [id number]  DOB - 10/23/1960  Chief Complaint  Patient presents with  . Back Pain        Subjective:   [name] [name] is a 54 y.o. 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 Patient complains of pain at a 7 in lower back, chronic. Shoulder and neck pain is worse. She is requesting refill of Vitamin B-12 and D, folic acid, Keppra, Protonix and albuterol inhaler. Patient taking Protonix three times a day instead of the 2 times prescribed. She has not had seizure since childhood but continued on Keppra by Neurologist. Patient has No headache today, No chest pain, No abdominal pain - No Nausea, No new weakness tingling or numbness. She is due for mammogram, pap smear and colonoscopy.  Problem  Other Type of Migraine  Colon Cancer Screening  Encounter for Screening Mammogram for Breast Cancer  Dyslipidemia    ALLERGIES: Allergies  Allergen Reactions  . Shellfish Allergy Anaphylaxis  . Gabapentin     rash    PAST MEDICAL HISTORY: Past Medical History  Diagnosis Date  . Fibromyalgia   . Chronic pain   . Seizures   . Migraine   . Pneumonia   . Pulmonary fibrosis   . Migraine without aura, with intractable migraine, so stated, without mention of status migrainosus 05/18/2014    MEDICATIONS AT HOME: Prior to Admission medications   Medication Sig Start Date End Date Taking? Authorizing Provider  albuterol (PROVENTIL HFA;VENTOLIN HFA) 108 (90 BASE) MCG/ACT inhaler Inhale 2 puffs into the lungs every 6 (six) hours as needed for wheezing or shortness of breath. 06/28/15  Yes Quentin Angst, MD  atorvastatin (LIPITOR) 40 MG tablet Take 1 tablet (40 mg total) by mouth daily. 06/28/15  Yes  Quentin Angst, MD  Cyanocobalamin (B-12) 2000 MCG TABS Take 2,000 mcg by mouth daily. 06/28/15  Yes Quentin Angst, MD  etodolac (LODINE) 500 MG tablet Take 1 tablet (500 mg total) by mouth 2 (two) times daily. 04/28/14  Yes Nyoka Cowden, MD  fentaNYL (DURAGESIC - DOSED MCG/HR) 50 MCG/HR Place 1 patch (50 mcg total) onto the skin every 3 (three) days. 04/28/14  Yes Nyoka Cowden, MD  folic acid (FOLVITE) 1 MG tablet Take 1 tablet daily. 06/28/15  Yes Quentin Angst, MD  levETIRAcetam (KEPPRA) 500 MG tablet Take 1 tablet (500 mg total) by mouth 2 (two) times daily. 06/28/15  Yes Quentin Angst, MD  pantoprazole (PROTONIX) 40 MG tablet Take 1 tablet (40 mg total) by mouth 3 (three) times daily. 06/28/15  Yes Quentin Angst, MD  polyethylene glycol (MIRALAX / GLYCOLAX) packet Take 17 g by mouth daily as needed for mild constipation or moderate constipation. 03/06/14  Yes Ambrose Finland, NP  pregabalin (LYRICA) 300 MG capsule Take 300 mg by mouth 2 (two) times daily.   Yes Historical Provider, MD  rizatriptan (MAXALT) 10 MG tablet Take 1 tablet (10 mg total) by mouth daily as needed for migraine. May repeat in 2 hours if needed 06/28/15  Yes Jerra Huckeby E Hyman Hopes, MD  topiramate (TOPAMAX) 25 MG tablet Take 3 tablets (75 mg total) by mouth at bedtime. 06/28/15  Yes Quentin Angst, MD  traMADol (ULTRAM) 50 MG  tablet Take 1-2 tablets (50-100 mg total) by mouth every 4 (four) hours as needed (cough). 04/28/14  Yes Nyoka Cowden, MD  Vitamin D, Ergocalciferol, (DRISDOL) 50000 UNITS CAPS capsule Take 1 capsule (50,000 Units total) by mouth every 7 (seven) days. Take on Saturday 12/14/14  Yes Ajia Chadderdon Annitta Needs, MD  furosemide (LASIX) 20 MG tablet Take 20 mg by mouth daily. 04/20/14 04/20/15  Historical Provider, MD  predniSONE (DELTASONE) 20 MG tablet Take 2 tablets (40 mg total) by mouth daily with breakfast. Patient not taking: Reported on 06/28/2015 03/03/14   Nyoka Cowden, MD      Objective:   Filed Vitals:   06/28/15 1141  BP: 105/74  Pulse: 54  Temp: 98.3 F (36.8 C)  TempSrc: Oral  Resp: 18  Height:  (1.549 m)  Weight: 145 lb 9.6 oz (66.044 kg)  SpO2: 100%    Exam General appearance : Awake, alert, not in any distress. Speech Clear. Not toxic looking, wheelchair  HEENT: Atraumatic and Normocephalic, pupils equally reactive to light and accomodation Neck: supple, no JVD. No cervical lymphadenopathy.  Chest:Good air entry bilaterally, no added sounds  CVS: S1 S2 regular, no murmurs.  Abdomen: Bowel sounds present, Non tender and not distended with no gaurding, rigidity or rebound. Extremities: B/L Lower Ext shows no edema, both legs are warm to touch Neurology: Awake alert, and oriented X 3, CN II-XII intact, Non focal Skin:No Rash  Data Review Lab Results  Component Value Date   HGBA1C 5.6 09/10/2014     Assessment & Plan   1. Fibromyalgia  - Cyanocobalamin (B-12) 2000 MCG TABS; Take 2,000 mcg by mouth daily.  Dispense: 90 tablet; Refill: 3  2. Seizures (HCC)  - folic acid (FOLVITE) 1 MG tablet; Take 1 tablet daily.  Dispense: 90 tablet; Refill: 3 - levETIRAcetam (KEPPRA) 500 MG tablet; Take 1 tablet (500 mg total) by mouth 2 (two) times daily.  Dispense: 180 tablet; Refill: 3 - topiramate (TOPAMAX) 25 MG tablet; Take 3 tablets (75 mg total) by mouth at bedtime.  Dispense: 90 tablet; Refill: 3  3. Gastroesophageal reflux disease without esophagitis  - pantoprazole (PROTONIX) 40 MG tablet; Take 1 tablet (40 mg total) by mouth 3 (three) times daily.  Dispense: 90 tablet; Refill: 6  4. Dyslipidemia  - atorvastatin (LIPITOR) 40 MG tablet; Take 1 tablet (40 mg total) by mouth daily.  Dispense: 90 tablet; Refill: 3  To address this please limit saturated fat to no more than 7% of your calories, limit cholesterol to 200 mg/day, increase fiber and exercise as tolerated. If needed we may add another cholesterol lowering medication to  your regimen.   5. Encounter for screening mammogram for breast cancer  - MM Digital Diagnostic Bilat; Future - Ambulatory referral to Gynecology  6. Colon cancer screening  - Ambulatory referral to Gastroenterology  7. Other type of migraine  - rizatriptan (MAXALT) 10 MG tablet; Take 1 tablet (10 mg total) by mouth daily as needed for migraine. May repeat in 2 hours if needed  Dispense: 10 tablet; Refill: 3  Patient have been counseled extensively about nutrition and exercise  Return in about 3 months (around 09/28/2015) for Follow up Pain and comorbidities, Routine Follow Up, Dyslipidemia.  The patient was given clear instructions to go to ER or return to medical center if symptoms don't improve, worsen or new problems develop. The patient verbalized understanding. The patient was told to call to get lab results if they haven't heard anything  in the next week.   This note has been created with Education officer, environmental. Any transcriptional errors are unintentional.    Jeanann Lewandowsky, MD, MHA, FACP, FAAP, CPE [facility] [place], [place] [phone]   06/28/2015, 12:52 PM

## 2015-06-28 NOTE — Progress Notes (Signed)
Patient here for Sciatic pain.  Patient complains of pain at a 7 in lower back. Shoulder and neck pain is worse.  Refill Vitamin B-12 and D, folic acid, Keppra, Protonix and albuterol inhaler.  Patient taking Protonix three times a day

## 2015-07-01 ENCOUNTER — Encounter: Payer: Self-pay | Admitting: Obstetrics & Gynecology

## 2015-07-06 ENCOUNTER — Telehealth: Payer: Self-pay | Admitting: Clinical

## 2015-07-06 NOTE — Telephone Encounter (Signed)
F/u with [name] [name], she is concerned about her mother, also a pt at [facility], [name] [name], whose legs have been swelling in the past week, worsening over the weekend. [name] [name] says she has elevated her mothers' legs, which has helped "a little"; she wants advice on what to do about that; she is aware she will receive a call back with advise.

## 2015-07-15 ENCOUNTER — Telehealth: Payer: Self-pay | Admitting: Internal Medicine

## 2015-07-15 NOTE — Telephone Encounter (Signed)
Pt. Called requesting to speak to PCP. Please f/u.

## 2015-07-28 ENCOUNTER — Encounter: Payer: Medicaid Other | Admitting: Obstetrics & Gynecology

## 2015-08-11 ENCOUNTER — Encounter: Payer: Medicaid Other | Admitting: Obstetrics and Gynecology

## 2015-09-03 ENCOUNTER — Telehealth: Payer: Self-pay | Admitting: Internal Medicine

## 2015-09-03 ENCOUNTER — Other Ambulatory Visit: Payer: Self-pay | Admitting: Internal Medicine

## 2015-09-03 MED ORDER — PANTOPRAZOLE SODIUM 40 MG PO TBEC: DELAYED_RELEASE_TABLET | ORAL | Status: DC

## 2015-09-03 NOTE — Telephone Encounter (Signed)
Pharmacy states they do not have the prescription. Prescription resent.

## 2015-09-03 NOTE — Telephone Encounter (Signed)
Patient called requesting a medication refill for pantropozale. Patient stated she called he pharmacy and they didn't have the prescription

## 2015-10-30 ENCOUNTER — Other Ambulatory Visit: Payer: Self-pay | Admitting: Internal Medicine

## 2015-11-01 ENCOUNTER — Telehealth: Payer: Self-pay | Admitting: Internal Medicine

## 2015-11-01 ENCOUNTER — Other Ambulatory Visit: Payer: Self-pay | Admitting: *Deleted

## 2015-11-01 DIAGNOSIS — R569 Unspecified convulsions: Secondary | ICD-10-CM

## 2015-11-01 MED ORDER — TOPIRAMATE 25 MG PO TABS: 75.0000 mg | ORAL_TABLET | Freq: Every day | ORAL | Status: DC

## 2015-11-01 NOTE — Telephone Encounter (Signed)
Patients Topamax was refilled with 3 additional refills.

## 2015-11-01 NOTE — Telephone Encounter (Signed)
Pt. Called requesting a med refill on topiramate (TOPAMAX) 25 MG tablet. Please f/u with pt.

## 2015-11-01 NOTE — Telephone Encounter (Signed)
Patient last seen and refilled on 06/28/15. Patients Topamax was refilled with 3 additional refills.

## 2015-11-02 ENCOUNTER — Ambulatory Visit: Payer: Medicaid Other | Admitting: Adult Health

## 2016-02-17 ENCOUNTER — Ambulatory Visit: Payer: Medicaid Other | Attending: Internal Medicine | Admitting: Internal Medicine

## 2016-02-17 ENCOUNTER — Telehealth: Payer: Self-pay | Admitting: Internal Medicine

## 2016-02-17 ENCOUNTER — Encounter: Payer: Self-pay | Admitting: Internal Medicine

## 2016-02-17 VITALS — BP 112/75 | HR 78 | Temp 98.4°F | Resp 18 | Ht 61.0 in | Wt 128.4 lb

## 2016-02-17 DIAGNOSIS — M797 Fibromyalgia: Secondary | ICD-10-CM | POA: Diagnosis not present

## 2016-02-17 DIAGNOSIS — Z1231 Encounter for screening mammogram for malignant neoplasm of breast: Secondary | ICD-10-CM

## 2016-02-17 DIAGNOSIS — J8489 Other specified interstitial pulmonary diseases: Secondary | ICD-10-CM

## 2016-02-17 DIAGNOSIS — Z1211 Encounter for screening for malignant neoplasm of colon: Secondary | ICD-10-CM | POA: Diagnosis not present

## 2016-02-17 DIAGNOSIS — E785 Hyperlipidemia, unspecified: Secondary | ICD-10-CM | POA: Diagnosis not present

## 2016-02-17 DIAGNOSIS — J8410 Pulmonary fibrosis, unspecified: Secondary | ICD-10-CM | POA: Insufficient documentation

## 2016-02-17 DIAGNOSIS — G8929 Other chronic pain: Secondary | ICD-10-CM

## 2016-02-17 DIAGNOSIS — M545 Low back pain, unspecified: Secondary | ICD-10-CM

## 2016-02-17 DIAGNOSIS — R569 Unspecified convulsions: Secondary | ICD-10-CM | POA: Insufficient documentation

## 2016-02-17 DIAGNOSIS — Z124 Encounter for screening for malignant neoplasm of cervix: Secondary | ICD-10-CM

## 2016-02-17 DIAGNOSIS — Z91013 Allergy to seafood: Secondary | ICD-10-CM | POA: Insufficient documentation

## 2016-02-17 DIAGNOSIS — Z79899 Other long term (current) drug therapy: Secondary | ICD-10-CM | POA: Insufficient documentation

## 2016-02-17 DIAGNOSIS — G43909 Migraine, unspecified, not intractable, without status migrainosus: Secondary | ICD-10-CM | POA: Diagnosis not present

## 2016-02-17 DIAGNOSIS — R1013 Epigastric pain: Secondary | ICD-10-CM

## 2016-02-17 MED ORDER — PANTOPRAZOLE SODIUM 40 MG PO TBEC: DELAYED_RELEASE_TABLET | ORAL | Status: DC

## 2016-02-17 MED ORDER — VITAMIN D (ERGOCALCIFEROL) 1.25 MG (50000 UNIT) PO CAPS: 50000.0000 [IU] | ORAL_CAPSULE | ORAL | Status: DC

## 2016-02-17 MED ORDER — FOLIC ACID 1 MG PO TABS: ORAL_TABLET | ORAL | Status: DC

## 2016-02-17 MED ORDER — B-12 2000 MCG PO TABS: 2000.0000 ug | ORAL_TABLET | Freq: Every day | ORAL | Status: DC

## 2016-02-17 MED ORDER — ATORVASTATIN CALCIUM 40 MG PO TABS: 40.0000 mg | ORAL_TABLET | Freq: Every day | ORAL | Status: DC

## 2016-02-17 NOTE — Progress Notes (Signed)
Patient is here for 3 month FU  Patient complains of aching all over. Patient had a recent fall on 02/11/16.  Patient has taken medication and patient has eaten today.

## 2016-02-17 NOTE — Telephone Encounter (Signed)
error 

## 2016-02-17 NOTE — Progress Notes (Signed)
Patient ID: [name] [name], female   DOB: [birth year], 55 y.o.   MRN: [id number]   [name] [name], is a 55 y.o. female  [id number]  [id number]  DOB - 11-Sep-1961  Chief Complaint  Patient presents with  . Follow-up    3 month        Subjective:   [name] [name] is a 55 y.o. 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 Patient currently had a PFT done at [facility] pulmonology clinic, she reported a near syncopal episode resulted in a fall with possible head injury. Patient said she was taken to the emergency room, CT head was negative for acute findings. Initial evaluation showed reactive hypoglycemia, she was treated appropriately. Patient is very disappointed with the care she received from the PFT center and the emergency room. She ended up staying in a hotel overnight because she was delayed for more than 8 hours in the emergency room before she could get her CT done. She requests refill of her medications today. Patient also follows up with neurologist. She claims she does not have any adult episode of seizure, her last seizure was in her childhood. She is however on Keppra and Topamax. She has very severe GERD, she takes Protonix 3 times a day instead of 2 times prescribed because of the severity. She is due for colonoscopy, mammogram and Pap smear. She is requesting referral to gynecologist for Pap smear. Patient has No headache, No chest pain, No abdominal pain - No Nausea, No new weakness tingling or numbness.  Problem  Abdominal Pain, Chronic, Epigastric    ALLERGIES: Allergies  Allergen Reactions  . Shellfish Allergy Anaphylaxis  . Gabapentin     rash    PAST MEDICAL HISTORY: Past Medical History  Diagnosis Date  . Fibromyalgia   . Chronic pain   . Seizures (HCC)   . Migraine   . Pneumonia   . Pulmonary fibrosis (HCC)   . Migraine  without aura, with intractable migraine, so stated, without mention of status migrainosus 05/18/2014    MEDICATIONS AT HOME: Prior to Admission medications   Medication Sig Start Date End Date Taking? Authorizing Provider  albuterol (PROVENTIL HFA;VENTOLIN HFA) 108 (90 BASE) MCG/ACT inhaler Inhale 2 puffs into the lungs every 6 (six) hours as needed for wheezing or shortness of breath. 06/28/15  Yes Quentin Angst, MD  atorvastatin (LIPITOR) 40 MG tablet Take 1 tablet (40 mg total) by mouth daily. 02/17/16  Yes Quentin Angst, MD  Cyanocobalamin (B-12) 2000 MCG TABS Take 2,000 mcg by mouth daily. 02/17/16  Yes Quentin Angst, MD  etodolac (LODINE) 500 MG tablet Take 1 tablet (500 mg total) by mouth 2 (two) times daily. 04/28/14  Yes Nyoka Cowden, MD  fentaNYL (DURAGESIC - DOSED MCG/HR) 50 MCG/HR Place 1 patch (50 mcg total) onto the skin every 3 (three) days. 04/28/14  Yes Nyoka Cowden, MD  folic acid (FOLVITE) 1 MG tablet Take 1 tablet daily. 02/17/16  Yes Quentin Angst, MD  levETIRAcetam (KEPPRA) 500 MG tablet Take 1 tablet (500 mg total) by mouth 2 (two) times daily. 06/28/15  Yes Quentin Angst, MD  pantoprazole (PROTONIX) 40 MG tablet TAKE 1 TABLET(40 MG) BY MOUTH TWICE DAILY 02/17/16  Yes Quentin Angst, MD  polyethylene glycol (MIRALAX / GLYCOLAX) packet Take 17 g by mouth daily as needed for mild constipation or moderate constipation.  03/06/14  Yes Ambrose FinlandValerie A Keck, NP  pregabalin (LYRICA) 300 MG capsule Take 300 mg by mouth 2 (two) times daily.   Yes Historical Provider, MD  rizatriptan (MAXALT) 10 MG tablet Take 1 tablet (10 mg total) by mouth daily as needed for migraine. May repeat in 2 hours if needed 06/28/15  Yes Jermarion Poffenberger E Hyman HopesJegede, MD  topiramate (TOPAMAX) 25 MG tablet Take 3 tablets (75 mg total) by mouth at bedtime. 11/01/15  Yes Quentin Angstlugbemiga E Ronnette Rump, MD  traMADol (ULTRAM) 50 MG tablet Take 1-2 tablets (50-100 mg total) by mouth every 4 (four) hours as needed  (cough). 04/28/14  Yes Nyoka CowdenMichael B Wert, MD  Vitamin D, Ergocalciferol, (DRISDOL) 50000 units CAPS capsule Take 1 capsule (50,000 Units total) by mouth every 7 (seven) days. 02/17/16  Yes Quentin Angstlugbemiga E Marquinn Meschke, MD  furosemide (LASIX) 20 MG tablet Take 20 mg by mouth daily. 04/20/14 04/20/15  Historical Provider, MD     Objective:   Filed Vitals:   02/17/16 1731  BP: 112/75  Pulse: 78  Temp: 98.4 F (36.9 C)  TempSrc: Oral  Resp: 18  Height: 5\' 1"  (1.549 m)  Weight: 128 lb 6.4 oz (58.242 kg)  SpO2: 100%   Exam General appearance : Awake, alert, not in any distress. Speech Clear. Not toxic looking, walks with walker HEENT: Atraumatic and Normocephalic, pupils equally reactive to light and accomodation Neck: supple, no JVD. No cervical lymphadenopathy.  Chest: Good air entry bilaterally, no added sounds  CVS: S1 S2 regular, no murmurs.  Abdomen: Bowel sounds present, Non tender and not distended with no gaurding, rigidity or rebound. Extremities: B/L Lower Ext shows no edema, both legs are warm to touch Neurology: Awake alert, and oriented X 3, CN II-XII intact, Non focal Skin: No Rash  Data Review Lab Results  Component Value Date   HGBA1C 5.6 09/10/2014     Assessment & Plan   1. Dyslipidemia Refill - atorvastatin (LIPITOR) 40 MG tablet; Take 1 tablet (40 mg total) by mouth daily.  Dispense: 90 tablet; Refill: 3  To address this please limit saturated fat to no more than 7% of your calories, limit cholesterol to 200 mg/day, increase fiber and exercise as tolerated. If needed we may add another cholesterol lowering medication to your regimen.   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 Midline low back pain without sciatica  - Continue pain medications - Follow up with pain clinic  4. Encounter for screening mammogram for  breast cancer  - MM Digital Screening; Future  5. Colon cancer screening  - Ambulatory referral to Gastroenterology  6. Abdominal pain, chronic, epigastric Refill - pantoprazole (PROTONIX) 40 MG tablet; TAKE 1 TABLET(40 MG) BY MOUTH TWICE DAILY  Dispense: 180 tablet; Refill: 3 - folic acid (FOLVITE) 1 MG tablet; Take 1 tablet daily.  Dispense: 90 tablet; Refill: 3 - Vitamin D, Ergocalciferol, (DRISDOL) 50000 units CAPS capsule; Take 1 capsule (50,000 Units total) by mouth every 7 (seven) days.  Dispense: 12 capsule; Refill: 1 - Cyanocobalamin (B-12) 2000 MCG TABS; Take 2,000 mcg by mouth daily.  Dispense: 90 tablet; Refill: 3  7. Pap smear for cervical cancer screening  - Ambulatory referral to Gynecology  Patient have been counseled extensively about nutrition and exercise  Return in about 3 months (around 05/19/2016) for Follow up Pain and comorbidities, Heart Failure and Hypertension.  The  patient was given clear instructions to go to ER or return to medical center if symptoms don't improve, worsen or new problems develop. The patient verbalized understanding. The patient was told to call to get lab results if they haven't heard anything in the next week.   This note has been created with Education officer, environmental. Any transcriptional errors are unintentional.    Jeanann Lewandowsky, MD, MHA, FACP, FAAP, CPE [facility] [place], [place] [phone]   02/17/2016, 6:28 PM

## 2016-02-18 ENCOUNTER — Encounter: Payer: Self-pay | Admitting: Gastroenterology

## 2016-02-18 ENCOUNTER — Other Ambulatory Visit: Payer: Self-pay | Admitting: Internal Medicine

## 2016-02-18 DIAGNOSIS — Z1231 Encounter for screening mammogram for malignant neoplasm of breast: Secondary | ICD-10-CM

## 2016-02-24 ENCOUNTER — Ambulatory Visit: Payer: Medicaid Other | Admitting: Pharmacist

## 2016-02-29 ENCOUNTER — Ambulatory Visit: Payer: Medicaid Other

## 2016-03-01 ENCOUNTER — Encounter: Payer: Self-pay | Admitting: Adult Health

## 2016-03-01 ENCOUNTER — Ambulatory Visit (INDEPENDENT_AMBULATORY_CARE_PROVIDER_SITE_OTHER): Payer: Medicaid Other | Admitting: Adult Health

## 2016-03-01 VITALS — BP 110/80 | HR 66 | Resp 16 | Ht 61.0 in | Wt 125.2 lb

## 2016-03-01 DIAGNOSIS — G43809 Other migraine, not intractable, without status migrainosus: Secondary | ICD-10-CM | POA: Diagnosis not present

## 2016-03-01 DIAGNOSIS — R569 Unspecified convulsions: Secondary | ICD-10-CM | POA: Diagnosis not present

## 2016-03-01 MED ORDER — LEVETIRACETAM 500 MG PO TABS: 500.0000 mg | ORAL_TABLET | Freq: Two times a day (BID) | ORAL | Status: DC

## 2016-03-01 MED ORDER — RIZATRIPTAN BENZOATE 10 MG PO TABS: 10.0000 mg | ORAL_TABLET | Freq: Every day | ORAL | Status: DC | PRN

## 2016-03-01 MED ORDER — TOPIRAMATE 25 MG PO TABS: 100.0000 mg | ORAL_TABLET | Freq: Every day | ORAL | Status: DC

## 2016-03-01 NOTE — Progress Notes (Addendum)
PATIENT: [name] [name] DOB: Jan 29, 1961  REASON FOR VISIT: follow up- migraines, seizures HISTORY FROM: patient  HISTORY OF PRESENT ILLNESS: [name] [name] is a 55 year old [name] with a history of migraines and seizures. She returns today for follow-up. She has not followed up in over 2 years. She states that her mom has cancer and she has been dealing with this. She states that her headache frequency has increased. She has approximately 3 headaches a week. Her headaches are located across the forehead. She does have photophobia, phonophobia, nausea but denies vomiting. She states that putting lemon on her tongue treats her nausea. She continues to take Topamax 75 mg daily. She uses Maxalt for her headaches and usually has to repeat the dose. It typically takes approximately 4 hours for her headache to improve or resolve. She reports that she has not had any seizure events. She takes Keppra 500 mg twice a day. Patient is being seen at [facility]- she reports that she did have a fall during pulmonary rehab- had a CT scan that was unremarkable.She denies any new neurological symptoms. She returns today for an evaluation.  HISTORY 08/24/14:[name] [name] is a 55 year old [name] with a history of migraines and seizures. She returns today for follow-up. The patient has been taking Topamax 75 mg at bedtime and Maxalt for her headaches. She states that her headaches have been doing well up until she fell last Thursday night. She fell forward and hit the top of her head on the edge of her nightstand. She states that it hurts to touch and everytime she moves her head she hears a "clicking sound." She states that since then she has had a headache. States that the headache comes and goes just about everyday. The patient did not go to the emergency room. She has had nausea since Thursday night but no vomiting. Patient states that her headache is a 9/10. She has been having blurry vision off and on since the fall and occasional  double vision. She states these headaches are different from what she normally has. She was started on a medication from [facility] unsure of the name that causes her diarrhea maybe cellcept?Marland Kitchen She denies being on any blood thinners. The patient continues to take Keppra for seizures. She is not had any recent seizures. Her last seizure was over 15 years ago.   HISTORY 05/18/14: [name] [name] with a history of migraine headaches since she was in high school. The patient indicates that usually she would have on average one headache a week, but she has been under stress recently after she was given the diagnosis of pulmonary fibrosis in March of 2015. Since that time, the headaches have become more frequent, occurring 3 or 4 times a week. The patient has taken Maxalt for headaches with some benefit, and she indicates that the duration of the headache is about 3-4 hours with the Maxalt. The patient indicates that the headaches sometimes will begin on one side or the other and then generalize, but usually the headaches begin throughout the head, associated with a pressure and a throbbing sensation. The patient will have some nausea and vomiting at times, and she will have some blurring of vision with occasional loss of vision. She reports that she has significant phonophobia and photophobia. Alcohol, chocolate, weather changes, and perfumes will bring on the headache. She has undergone MRI evaluation of the brain in February 2015 showing a chronic left parietal white matter change. The patient has  a history of seizures again since high school, and she has been on various medications such as Dilantin, Trileptal, and more recently Keppra for the seizures. The last seizure event was 15 years ago. The patient has been using a walker since the diagnosis of pulmonary fibrosis. She is sent to this office for an evaluation.   REVIEW OF SYSTEMS: Out of a complete 14 system review of symptoms,  the patient complains only of the following symptoms, and all other reviewed systems are negative.  Chills, fatigue, ear pain, ringing in ears, light sensitivity, loss of vision, blurred vision, cough, wheezing, shortness of breath, chest pain, insomnia, snoring, sleep talking, nausea, constipation, abdominal pain, cold intolerance, environmental allergies, difficulty urinating, painful urination, joint pain, back pain, aching muscles, walking difficulty, neck pain, depression, weakness, speech difficulty, headache, dizziness, memory loss, anemia  ALLERGIES: Allergies  Allergen Reactions  . Shellfish Allergy Anaphylaxis  . Gabapentin     rash    HOME MEDICATIONS: Outpatient Prescriptions Prior to Visit  Medication Sig Dispense Refill  . albuterol (PROVENTIL HFA;VENTOLIN HFA) 108 (90 BASE) MCG/ACT inhaler Inhale 2 puffs into the lungs every 6 (six) hours as needed for wheezing or shortness of breath. 1 Inhaler 2  . atorvastatin (LIPITOR) 40 MG tablet Take 1 tablet (40 mg total) by mouth daily. 90 tablet 3  . Cyanocobalamin (B-12) 2000 MCG TABS Take 2,000 mcg by mouth daily. 90 tablet 3  . etodolac (LODINE) 500 MG tablet Take 1 tablet (500 mg total) by mouth 2 (two) times daily. 30 tablet 0  . fentaNYL (DURAGESIC - DOSED MCG/HR) 50 MCG/HR Place 1 patch (50 mcg total) onto the skin every 3 (three) days. 5 patch 0  . folic acid (FOLVITE) 1 MG tablet Take 1 tablet daily. 90 tablet 3  . pantoprazole (PROTONIX) 40 MG tablet TAKE 1 TABLET(40 MG) BY MOUTH TWICE DAILY 180 tablet 3  . polyethylene glycol (MIRALAX / GLYCOLAX) packet Take 17 g by mouth daily as needed for mild constipation or moderate constipation. 14 each 0  . pregabalin (LYRICA) 300 MG capsule Take 300 mg by mouth 2 (two) times daily.    . traMADol (ULTRAM) 50 MG tablet Take 1-2 tablets (50-100 mg total) by mouth every 4 (four) hours as needed (cough). 84 tablet 0  . Vitamin D, Ergocalciferol, (DRISDOL) 50000 units CAPS capsule Take 1  capsule (50,000 Units total) by mouth every 7 (seven) days. 12 capsule 1  . levETIRAcetam (KEPPRA) 500 MG tablet Take 1 tablet (500 mg total) by mouth 2 (two) times daily. 180 tablet 3  . rizatriptan (MAXALT) 10 MG tablet Take 1 tablet (10 mg total) by mouth daily as needed for migraine. May repeat in 2 hours if needed 10 tablet 3  . topiramate (TOPAMAX) 25 MG tablet Take 3 tablets (75 mg total) by mouth at bedtime. 90 tablet 3  . furosemide (LASIX) 20 MG tablet Take 20 mg by mouth daily.     No facility-administered medications prior to visit.    PAST MEDICAL HISTORY: Past Medical History  Diagnosis Date  . Fibromyalgia   . Chronic pain   . Seizures (HCC)   . Migraine   . Pneumonia   . Pulmonary fibrosis (HCC)   . Migraine without aura, with intractable migraine, so stated, without mention of status migrainosus 05/18/2014    PAST SURGICAL HISTORY: Past Surgical History  Procedure Laterality Date  . Video bronchoscopy N/A 11/21/2013    Procedure: VIDEO BRONCHOSCOPY;  Surgeon: Delight Ovens, MD;  Location: [facility] OR;  Service: Thoracic;  Laterality: N/A;  . Video assisted thoracoscopy Left 11/21/2013    Procedure: VIDEO ASSISTED THORACOSCOPY;  Surgeon: Delight OvensEdward B Gerhardt, MD;  [facility];  Service: Thoracic;  Laterality: Left;  . Lung biopsy Left 11/21/2013    Procedure: LUNG BIOPSY;  Surgeon: Delight OvensEdward B Gerhardt, MD;  [facility];  Service: Thoracic;  Laterality: Left;  . Abdominal hysterectomy      FAMILY HISTORY: Family History  Problem Relation Age of Onset  . Hypertension Mother   . Hepatitis C Mother   . Diabetes Father   . Hypertension Father   . Bipolar disorder Brother   . Multiple sclerosis Sister     SOCIAL HISTORY: Social History   Social History  . Marital Status: Single    Spouse Name: N/A  . Number of Children: 0  . Years of Education: college 3   Occupational History  . unemployed    Social History Main Topics  . Smoking status: Former Smoker --  0.00 packs/day for 10 years    Types: Cigarettes    Quit date: 09/18/2004  . Smokeless tobacco: Never Used  . Alcohol Use: No  . Drug Use: No  . Sexual Activity: No   Other Topics Concern  . Not on file   Social History Narrative      PHYSICAL EXAM  Filed Vitals:   03/01/16 1500  BP: 110/80  Pulse: 66  Resp: 16  Height: 5\' 1"  (1.549 m)  Weight: 125 lb 3.2 oz (56.79 kg)   Body mass index is 23.67 kg/(m^2).  Generalized: Well developed, in no acute distress   Neurological examination  Mentation: Alert oriented to time, place, history taking. Follows all commands speech and language fluent Cranial nerve II-XII: Pupils were equal round reactive to light. Extraocular movements were full, visual field were full on confrontational test. Facial sensation and strength were normal. Uvula tongue midline. Head turning and shoulder shrug  were normal and symmetric. Motor: The motor testing reveals 5 over 5 strength of all 4 extremities. Good symmetric motor tone is noted throughout.  Sensory: Sensory testing is intact to soft touch on all 4 extremities. No evidence of extinction is noted.  Coordination: Cerebellar testing reveals good finger-nose-finger and heel-to-shin bilaterally.  Gait and station: Patient uses a rolling walker when ambulating. Tandem gait not attempted. Reflexes: Deep tendon reflexes are symmetric and normal bilaterally.   DIAGNOSTIC DATA (LABS, IMAGING, TESTING) - I reviewed patient records, labs, notes, testing and imaging myself where available.  Lab Results  Component Value Date   WBC 8.1 05/06/2015   HGB 11.9* 05/06/2015   HCT 38.4 05/06/2015   MCV 79.3 05/06/2015   PLT 172 05/06/2015      Component Value Date/Time   NA 143 05/06/2015 1540   K 3.7 05/06/2015 1540   CL 108 05/06/2015 1540   CO2 21 05/06/2015 1540   GLUCOSE 87 05/06/2015 1540   BUN 10 05/06/2015 1540   CREATININE 1.23* 05/06/2015 1540   CREATININE 1.3* 03/03/2014 1252   CALCIUM  9.5 05/06/2015 1540   PROT 5.8* 05/06/2015 1540   ALBUMIN 4.0 05/06/2015 1540   AST 18 05/06/2015 1540   ALT 10 05/06/2015 1540   ALKPHOS 177* 05/06/2015 1540   BILITOT 0.8 05/06/2015 1540   GFRNONAA 50* 05/06/2015 1540   GFRNONAA >90 11/27/2013 0620   GFRAA 58* 05/06/2015 1540   GFRAA >90 11/27/2013 0620       ASSESSMENT AND PLAN 54 y.o.  year old [name]  has a past medical history of Fibromyalgia; Chronic pain; Seizures (HCC); Migraine; Pneumonia; Pulmonary fibrosis (HCC); and Migraine without aura, with intractable migraine, so stated, without mention of status migrainosus (05/18/2014). here with:  1. Migraine headache 2. Seizure  The patient's seizures have been controlled with Keppra. She will continue on Keppra 500 mg twice a day. The patient's migraine frequency has increased. We will increase Topamax 200 mg at bedtime. She will continue using Maxalt. Patient advised that she has any new symptoms she should let us know. She will follow-up in 6 months or sooner if needed.     Butch Penny, MSN, NP-C 03/01/2016, 3:36 PM [facility] [address], [address] [place], [place] [address] [phone]   I reviewed the above note and documentation by the Nurse Practitioner and agree with the history, physical exam, assessment and plan as outlined above. I was immediately available for face-to-face consultation. Huston Foley, MD, PhD [facility])

## 2016-03-01 NOTE — Patient Instructions (Signed)
Increase Topamax to 100 mg at bedtime (4 tablets) Continue Keppra and maxalt If your symptoms worsen or you develop new symptoms please let us know.

## 2016-03-02 NOTE — Telephone Encounter (Signed)
Patient called stating that she has to have an oxygen test done. Pt. Sp[facility] with Judeth [facility]Stephanie [facility] and  She stated that pt. Has to do the oxygen during her sleep at her home. Please f/u with pt.

## 2016-03-06 ENCOUNTER — Telehealth: Payer: Self-pay | Admitting: Adult Health

## 2016-03-06 NOTE — Telephone Encounter (Signed)
I spoke to pt.   She wants to have copy of her ofv note, that states she has not had seizures (since over 659yrs ago.).

## 2016-03-06 NOTE — Telephone Encounter (Signed)
Pt called in about wrong information on her paper work. Please call and advise

## 2016-03-07 NOTE — Telephone Encounter (Signed)
Pt last office note mailed out 03/07/2016 to pt home address.

## 2016-03-09 NOTE — Telephone Encounter (Signed)
PC[facility]

## 2016-03-22 ENCOUNTER — Telehealth: Payer: Self-pay | Admitting: *Deleted

## 2016-03-22 NOTE — Telephone Encounter (Signed)
Notes received and placed in MM/NP in box.

## 2016-04-17 ENCOUNTER — Encounter: Payer: Self-pay | Admitting: Gastroenterology

## 2016-04-17 ENCOUNTER — Ambulatory Visit (INDEPENDENT_AMBULATORY_CARE_PROVIDER_SITE_OTHER): Payer: Medicaid Other | Admitting: Gastroenterology

## 2016-04-17 VITALS — BP 136/62 | HR 72 | Ht 61.0 in | Wt 124.0 lb

## 2016-04-17 DIAGNOSIS — K5900 Constipation, unspecified: Secondary | ICD-10-CM

## 2016-04-17 DIAGNOSIS — K219 Gastro-esophageal reflux disease without esophagitis: Secondary | ICD-10-CM

## 2016-04-17 DIAGNOSIS — K921 Melena: Secondary | ICD-10-CM | POA: Diagnosis not present

## 2016-04-17 DIAGNOSIS — R1033 Periumbilical pain: Secondary | ICD-10-CM

## 2016-04-17 MED ORDER — METOCLOPRAMIDE HCL 10 MG PO TABS: ORAL_TABLET | ORAL | 0 refills | Status: DC

## 2016-04-17 MED ORDER — NA SULFATE-K SULFATE-MG SULF 17.5-3.13-1.6 GM/177ML PO SOLN: 1.0000 | Freq: Once | ORAL | 0 refills | Status: AC

## 2016-04-17 NOTE — Patient Instructions (Signed)
We have sent the following medications to your pharmacy for you to pick up at your convenience:Reglan and Suprep.   Take your Miralax 3-4 x daily every day leading up to your procedures. Especially the week prior to your procedure to make sure your bowels are regulated.   You have been scheduled for an endoscopy and colonoscopy. Please follow the written instructions given to you at your visit today. Please pick up your prep supplies at the pharmacy within the next 1-3 days. If you use inhalers (even only as needed), please bring them with you on the day of your procedure. Your physician has requested that you go to www.startemmi.com and enter the access code given to you at your visit today. This web site gives a general overview about your procedure. However, you should still follow specific instructions given to you by our office regarding your preparation for the procedure.  Thank you for choosing me and [facility].  Venita Lick. Pleas Koch., MD., Clementeen Graham

## 2016-04-17 NOTE — Progress Notes (Signed)
    History of Present Illness: This is a 55 year old female referred by Quentin Angst, MD for the evaluation of rectal bleeding, chronic constipation, GERD and chronic periumbilical abdominal pain. She relates problems with constipation and abdominal pain since about age 32. She has had intermittent rectal bleeding for several years. She states she was evaluated in [place] about 10 years ago and underwent EGD and an incomplete colonoscopy. She is unaware the findings but thinks they were normal. Her constipation has gradually worsened over the years and she requires laxatives to have regular bowel movements. Most recently she has used MiraLAX 2-3 times a day when necessary. CT from 2015 below. She takes pantoprazole for management of GERD which she feels well well. Her abdominal pain is generally in her periumbilical region and comes and goes occasionally related to food or bowel movements. CBC from 04/2015 showed Hb=11.9. Denies weight loss, diarrhea, change in stool caliber, melena, hematochezia, nausea, vomiting, dysphagia, chest pain.  Abd/pevic CT 10/25/2013 IMPRESSION: 1. Gas within the bladder, and suggestion of some perivesical stranding, most compatible with acute urinary infection unless there has been recent bladder catheterization.  2. Hyperdense fluid within the bladder, with dense bladder contrast noted on 10/15/2013. Suspect today's density is excreted contrast rather than hematuria. Clinical correlation recommended. 3. Nephrolithiasis but no evidence of obstructive uropathy. 4. 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 Flow is reconstituted at the left common femoral artery.  Review of Systems: Pertinent positive and negative review of systems were noted in the above HPI section. All other review of systems were otherwise negative.  Current Medications, Allergies, Past Medical History, Past Surgical History, Family History and Social History were reviewed in  [facility] record.  Physical Exam: General: Well developed, well nourished, no acute distress Head: Normocephalic and atraumatic Eyes:  sclerae anicteric, EOMI Ears: Normal auditory acuity Mouth: No deformity or lesions Neck: Supple, no masses or thyromegaly Lungs: Clear throughout to auscultation Heart: Regular rate and rhythm; no murmurs, rubs or bruits Abdomen: Soft, minimal mid abdominal tenderness and non distended. No masses, hepatosplenomegaly or hernias noted. Normal Bowel sounds Rectal: deferred to colonoscopy Musculoskeletal: Symmetrical with no gross deformities  Skin: No lesions on visible extremities Pulses:  Normal pulses noted Extremities: No clubbing, cyanosis, edema or deformities noted Neurological: Alert oriented x 4, grossly nonfocal Cervical Nodes:  No significant cervical adenopathy Inguinal Nodes: No significant inguinal adenopathy Psychological:  Alert and cooperative. Normal mood and affect  Assessment and Recommendations:  1.  Hematochezia, periumbilical abdominal pain, GERD and chronic constipation. R/O colorectal neoplasms, hemorrhoids, ulcer. Suspect her abdominal pain is related to constipation. Miralax 2-4 times daily titrated for an adequate bowel movement daily. The importance of maintaining MiraLAX on a daily basis and not when necessary was stressed. Schedule colonoscopy and EGD. If colonoscopy is incomplete will proceed with ACBE. Continue pantoprazole for suspected GERD. The risks (including bleeding, perforation, infection, missed lesions, medication reactions and possible hospitalization or surgery if complications occur), benefits, and alternatives to colonoscopy with possible biopsy and possible polypectomy were discussed with the patient and they consent to proceed. The risks (including bleeding, perforation, infection, missed lesions, medication reactions and possible hospitalization or surgery if complications occur),  benefits, and alternatives to endoscopy with possible biopsy and possible dilation were discussed with the patient and they consent to proceed.     cc: Quentin Angst, MD [address] [place], [place] [address]

## 2016-05-03 ENCOUNTER — Ambulatory Visit (AMBULATORY_SURGERY_CENTER): Payer: Medicaid Other | Admitting: Gastroenterology

## 2016-05-03 ENCOUNTER — Encounter: Payer: Self-pay | Admitting: Gastroenterology

## 2016-05-03 VITALS — BP 97/54 | HR 50 | Temp 97.8°F | Resp 17 | Ht 61.0 in | Wt 124.0 lb

## 2016-05-03 DIAGNOSIS — R1033 Periumbilical pain: Secondary | ICD-10-CM

## 2016-05-03 DIAGNOSIS — K219 Gastro-esophageal reflux disease without esophagitis: Secondary | ICD-10-CM

## 2016-05-03 DIAGNOSIS — K208 Other esophagitis: Secondary | ICD-10-CM | POA: Diagnosis not present

## 2016-05-03 DIAGNOSIS — K921 Melena: Secondary | ICD-10-CM | POA: Diagnosis not present

## 2016-05-03 MED ORDER — SODIUM CHLORIDE 0.9 % IV SOLN: 500.0000 mL | INTRAVENOUS | Status: DC

## 2016-05-03 NOTE — Progress Notes (Signed)
To recovery awake alert VSS report to RN 

## 2016-05-03 NOTE — Op Note (Signed)
[facility] Patient Name: [name] [name] Procedure Date: 05/03/2016 2:03 PM MRN: [id number] Endoscopist: Meryl DareMalcolm T Navy Belay , MD Age: [age] Referring MD:  Date of Birth: 05/28/1961 Gender: Female Account #: [id number] Procedure:                Colonoscopy Indications:              Evaluation of unexplained GI bleeding,                            Periumbilical abdominal pain, Hematochezia Medicines:                Monitored Anesthesia Care Procedure:                Pre-Anesthesia Assessment:                           - Prior to the procedure, a History and Physical                            was performed, and patient medications and                            allergies were reviewed. The patient's tolerance of                            previous anesthesia was also reviewed. The risks                            and benefits of the procedure and the sedation                            options and risks were discussed with the patient.                            All questions were answered, and informed consent                            was obtained. Prior Anticoagulants: The patient has                            taken no previous anticoagulant or antiplatelet                            agents. ASA Grade Assessment: III - A patient with                            severe systemic disease. After reviewing the risks                            and benefits, the patient was deemed in                            satisfactory condition to undergo the procedure.  After obtaining informed consent, the colonoscope                            was passed under direct vision. Throughout the                            procedure, the patient's blood pressure, pulse, and                            oxygen saturations were monitored continuously. The                            Model PCF-H190L [id number]) scope was introduced                            through the anus and  advanced to the the terminal                            ileum. The terminal ileum, ileocecal valve,                            appendiceal orifice, and rectum were photographed.                            The quality of the bowel preparation was good. The                            patient tolerated the procedure well. The                            colonoscopy was somewhat difficult due to a                            tortuous colon. Successful completion of the                            procedure was aided by using manual pressure,                            withdrawing and reinserting the scope and                            straightening and shortening the scope to obtain                            bowel loop reduction. Scope In: 2:27:08 PM Scope Out: 2:40:15 PM Scope Withdrawal Time: 0 hours 7 minutes 57 seconds  Total Procedure Duration: 0 hours 13 minutes 7 seconds  Findings:                 External hemorrhoids were found during                            retroflexion. The hemorrhoids were small.  The exam was otherwise without abnormality on                            direct and retroflexion views.                           The terminal ileum appeared normal. Complications:            No immediate complications. Estimated blood loss:                            None. Estimated Blood Loss:     Estimated blood loss: none. Impression:               - External hemorrhoids.                           - The examination was otherwise normal on direct                            and retroflexion views.                           - The examined portion of the ileum was normal.                           - No specimens collected. Recommendation:           - Repeat colonoscopy in 10 years for screening                            purposes.                           - Patient has a contact number available for                            emergencies. The signs and  symptoms of potential                            delayed complications were discussed with the                            patient. Return to normal activities tomorrow.                            Written discharge instructions were provided to the                            patient.                           - Resume previous diet.                           - Continue present medications.                           -  Continue treatment for constipation                           - OTC Preparation H cream bid as needed for                            hemorrhoidal symptoms                           - Return to PCP for ongoing care Meryl Dare, MD 05/03/2016 2:45:24 PM This report has been signed electronically.

## 2016-05-03 NOTE — Op Note (Signed)
[facility] Patient Name: [name] [name] Lipsett Procedure Date: 05/03/2016 2:01 PM MRN: [id number] Endoscopist: Meryl DareMalcolm T Gaelan Glennon , MD Age: [age] Referring MD:  Date of Birth: 02/20/1961 Gender: Female Account #: [id number] Procedure:                Upper GI endoscopy Indications:              Periumbilical abdominal pain, Suspected                            gastro-esophageal reflux disease Medicines:                Monitored Anesthesia Care Procedure:                Pre-Anesthesia Assessment:                           - Prior to the procedure, a History and Physical                            was performed, and patient medications and                            allergies were reviewed. The patient's tolerance of                            previous anesthesia was also reviewed. The risks                            and benefits of the procedure and the sedation                            options and risks were discussed with the patient.                            All questions were answered, and informed consent                            was obtained. Prior Anticoagulants: The patient has                            taken no previous anticoagulant or antiplatelet                            agents. ASA Grade Assessment: III - A patient with                            severe systemic disease. After reviewing the risks                            and benefits, the patient was deemed in                            satisfactory condition to undergo the procedure.  After obtaining informed consent, the endoscope was                            passed under direct vision. Throughout the                            procedure, the patient's blood pressure, pulse, and                            oxygen saturations were monitored continuously. The                            Model GIF-HQ190 [id number] scope was introduced                            through the mouth, and  advanced to the second part                            of duodenum. The upper GI endoscopy was                            accomplished without difficulty. The patient                            tolerated the procedure well. Scope In: Scope Out: Findings:                 Localized focal mild inflammation characterized by                            granularity was found in the distal esophagus just                            proximal to the EGJ. Biopsies were taken with a                            cold forceps for histology.                           The exam of the esophagus was otherwise normal.                           The entire examined stomach was normal.                           The cardia and gastric fundus were normal on                            retroflexion.                           The duodenal bulb and second portion of the                            duodenum were normal. Complications:  No immediate complications. Estimated Blood Loss:     Estimated blood loss was minimal. Impression:               - Esophageal mucosal changes were present,                            including granularity. Findings are suggestive of                            inflammation. Biopsied.                           - Normal stomach.                           - Normal duodenal bulb and second portion of the                            duodenum. Recommendation:           - Patient has a contact number available for                            emergencies. The signs and symptoms of potential                            delayed complications were discussed with the                            patient. Return to normal activities tomorrow.                            Written discharge instructions were provided to the                            patient.                           - Resume previous diet.                           - Continue present medications.                           -  Await pathology results. Meryl DareMalcolm T Zeddie Njie, MD 05/03/2016 2:26:05 PM This report has been signed electronically.

## 2016-05-03 NOTE — Patient Instructions (Signed)
YOU HAD AN ENDOSCOPIC PROCEDURE TODAY AT [facility]:   Refer to the procedure report that was given to you for any specific questions about what was found during the examination.  If the procedure report does not answer your questions, please call your gastroenterologist to clarify.  If you requested that your care partner not be given the details of your procedure findings, then the procedure report has been included in a sealed envelope for you to review at your convenience later.  YOU SHOULD EXPECT: Some feelings of bloating in the abdomen. Passage of more gas than usual.  Walking can help get rid of the air that was put into your GI tract during the procedure and reduce the bloating. If you had a lower endoscopy (such as a colonoscopy or flexible sigmoidoscopy) you may notice spotting of blood in your stool or on the toilet paper. If you underwent a bowel prep for your procedure, you may not have a normal bowel movement for a few days.  Please Note:  You might notice some irritation and congestion in your nose or some drainage.  This is from the oxygen used during your procedure.  There is no need for concern and it should clear up in a day or so.  SYMPTOMS TO REPORT IMMEDIATELY:   Following lower endoscopy (colonoscopy or flexible sigmoidoscopy):  Excessive amounts of blood in the stool  Significant tenderness or worsening of abdominal pains  Swelling of the abdomen that is new, acute  Fever of 100F or higher   Following upper endoscopy (EGD)  Vomiting of blood or coffee ground material  New chest pain or pain under the shoulder blades  Painful or persistently difficult swallowing  New shortness of breath  Fever of 100F or higher  Black, tarry-looking stools  For urgent or emergent issues, a gastroenterologist can be reached at any hour by calling [phone].   DIET:  We do recommend a small meal at first, but then you may proceed to your regular diet.  Drink  plenty of fluids but you should avoid alcoholic beverages for 24 hours.  ACTIVITY:  You should plan to take it easy for the rest of today and you should NOT DRIVE or use heavy machinery until tomorrow (because of the sedation medicines used during the test).    FOLLOW UP: Our staff will call the number listed on your records the next business day following your procedure to check on you and address any questions or concerns that you may have regarding the information given to you following your procedure. If we do not reach you, we will leave a message.  However, if you are feeling well and you are not experiencing any problems, there is no need to return our call.  We will assume that you have returned to your regular daily activities without incident.  If any biopsies were taken you will be contacted by phone or by letter within the next 1-3 weeks.  Please call us at [phone] if you have not heard about the biopsies in 3 weeks.    SIGNATURES/CONFIDENTIALITY: You and/or your care partner have signed paperwork which will be entered into your electronic medical record.  These signatures attest to the fact that that the information above on your After Visit Summary has been reviewed and is understood.  Full responsibility of the confidentiality of this discharge information lies with you and/or your care-partner.    Handouts were given to your care partner on hemorrhoids  and GERD. Use OTC Preparation H 2 x daily as needed for hemorrhoidal symptomys You may resume your current medications today. Await biopsy results. Please call if any questions or concerns.

## 2016-05-03 NOTE — Progress Notes (Signed)
No problems noted in the recovery room. maw 

## 2016-05-03 NOTE — Progress Notes (Signed)
Called to room for pathology. 

## 2016-05-04 ENCOUNTER — Telehealth: Payer: Self-pay | Admitting: *Deleted

## 2016-05-04 NOTE — Telephone Encounter (Signed)
  Follow up Call-  Call back number 05/03/2016  Post procedure Call Back phone  # [phone]  Permission to leave phone message Yes  Some recent data might be hidden     Patient questions:  Do you have a fever, pain , or abdominal swelling? No. Pain Score  0 *  Have you tolerated food without any problems? Yes.    Have you been able to return to your normal activities? Yes.    Do you have any questions about your discharge instructions: Diet   No. Medications  No. Follow up visit  No.  Do you have questions or concerns about your Care? No.  Actions: * If pain score is 4 or above: No action needed, pain <4.

## 2016-05-11 ENCOUNTER — Encounter: Payer: Self-pay | Admitting: Gastroenterology

## 2016-05-19 ENCOUNTER — Telehealth: Payer: Self-pay | Admitting: Gastroenterology

## 2016-05-19 NOTE — Telephone Encounter (Signed)
Reviewed results with the patient.  She states that she still has reflux symptoms and pain on pantoprazole.  Is there an alternative PPI to try ? [name] [name] she has tried Prevacid in the past without relief

## 2016-05-23 NOTE — Telephone Encounter (Signed)
I don't think her symptoms are reflux related. She as taking pantoprazole tid without good results. She can try omeprazole 40 mg po bid. If symptoms not improved PCP will need to look for other causes of her symptoms.

## 2016-05-23 NOTE — Telephone Encounter (Signed)
Left message for patient to call back  

## 2016-05-25 ENCOUNTER — Telehealth: Payer: Self-pay | Admitting: Gastroenterology

## 2016-05-25 MED ORDER — OMEPRAZOLE 40 MG PO CPDR: 40.0000 mg | DELAYED_RELEASE_CAPSULE | Freq: Two times a day (BID) | ORAL | 3 refills | Status: DC

## 2016-05-25 NOTE — Telephone Encounter (Signed)
Patient notified

## 2016-05-25 NOTE — Telephone Encounter (Signed)
done

## 2016-05-25 NOTE — Telephone Encounter (Signed)
Left message for patient to call back  

## 2016-06-08 ENCOUNTER — Encounter: Payer: Self-pay | Admitting: Internal Medicine

## 2016-06-08 ENCOUNTER — Ambulatory Visit: Payer: Medicaid Other | Attending: Internal Medicine | Admitting: Internal Medicine

## 2016-06-08 VITALS — BP 102/71 | HR 63 | Temp 98.3°F | Resp 18 | Ht 61.0 in | Wt 122.2 lb

## 2016-06-08 DIAGNOSIS — R569 Unspecified convulsions: Secondary | ICD-10-CM | POA: Diagnosis not present

## 2016-06-08 DIAGNOSIS — J8489 Other specified interstitial pulmonary diseases: Secondary | ICD-10-CM | POA: Diagnosis not present

## 2016-06-08 DIAGNOSIS — M797 Fibromyalgia: Secondary | ICD-10-CM | POA: Diagnosis not present

## 2016-06-08 DIAGNOSIS — K219 Gastro-esophageal reflux disease without esophagitis: Secondary | ICD-10-CM | POA: Insufficient documentation

## 2016-06-08 DIAGNOSIS — E785 Hyperlipidemia, unspecified: Secondary | ICD-10-CM | POA: Diagnosis not present

## 2016-06-08 DIAGNOSIS — G8929 Other chronic pain: Secondary | ICD-10-CM | POA: Insufficient documentation

## 2016-06-08 DIAGNOSIS — H538 Other visual disturbances: Secondary | ICD-10-CM | POA: Insufficient documentation

## 2016-06-08 DIAGNOSIS — J8410 Pulmonary fibrosis, unspecified: Secondary | ICD-10-CM | POA: Diagnosis not present

## 2016-06-08 DIAGNOSIS — J449 Chronic obstructive pulmonary disease, unspecified: Secondary | ICD-10-CM | POA: Insufficient documentation

## 2016-06-08 DIAGNOSIS — G43909 Migraine, unspecified, not intractable, without status migrainosus: Secondary | ICD-10-CM | POA: Insufficient documentation

## 2016-06-08 MED ORDER — LEVETIRACETAM 500 MG PO TABS: 500.0000 mg | ORAL_TABLET | Freq: Two times a day (BID) | ORAL | 5 refills | Status: DC

## 2016-06-08 NOTE — Progress Notes (Signed)
[name] [name], is a 55 y.o. female  [id number]  [id number]  DOB - 10/24/1960  Chief Complaint  Patient presents with  . Follow-up        Subjective:   [name] [name] is a 55 y.o. female here today for a follow up visit. Patient has No headache, No chest pain, No abdominal pain - No Nausea, No new weakness tingling or numbness, No Cough - SOB.  Problem  Blurry Vision, Bilateral    ALLERGIES: Allergies  Allergen Reactions  . Shellfish Allergy Anaphylaxis  . Gabapentin     rash    PAST MEDICAL HISTORY: Past Medical History:  Diagnosis Date  . Chronic pain   . COPD (chronic obstructive pulmonary disease) (HCC)   . Fibromyalgia   . Migraine   . Migraine without aura, with intractable migraine, so stated, without mention of status migrainosus 05/18/2014  . Pneumonia   . Pulmonary fibrosis (HCC)   . Seizures (HCC)     MEDICATIONS AT HOME: Prior to Admission medications   Medication Sig Start Date End Date Taking? Authorizing Provider  albuterol (PROVENTIL HFA;VENTOLIN HFA) 108 (90 Base) MCG/ACT inhaler Inhale 2 puffs into the lungs every 6 (six) hours as needed.   Yes Historical Provider, MD  atorvastatin (LIPITOR) 40 MG tablet Take 1 tablet (40 mg total) by mouth daily. 02/17/16  Yes Quentin Angstlugbemiga E Mace Weinberg, MD  atorvastatin (LIPITOR) 40 MG tablet Take 40 mg by mouth daily. 10/06/14  Yes Historical Provider, MD  Cyanocobalamin (B-12) 2000 MCG TABS Take 2,000 mcg by mouth daily. 02/17/16  Yes Quentin Angstlugbemiga E Davante Gerke, MD  Cyanocobalamin (B-12) 2000 MCG TABS Take 1 tablet by mouth daily. 04/28/14  Yes Historical Provider, MD  etodolac (LODINE) 500 MG tablet Take 1 tablet (500 mg total) by mouth 2 (two) times daily. 04/28/14  Yes Nyoka CowdenMichael B Wert, MD  fentaNYL (DURAGESIC - DOSED MCG/HR) 50 MCG/HR Place 1 patch (50 mcg total) onto the skin every 3 (three) days. 04/28/14  Yes Nyoka CowdenMichael B Wert, MD  fentaNYL (DURAGESIC - DOSED MCG/HR) 50 MCG/HR Place 50 mcg onto the skin every 3 (three) days.    Yes Historical Provider, MD  folic acid (FOLVITE) 1 MG tablet Take 1 tablet daily. 02/17/16  Yes Quentin Angstlugbemiga E Sybel Standish, MD  folic acid (FOLVITE) 1 MG tablet Take 1 mg by mouth daily.   Yes Historical Provider, MD  levETIRAcetam (KEPPRA) 500 MG tablet Take 1 tablet (500 mg total) by mouth 2 (two) times daily. 06/08/16  Yes Quentin Angstlugbemiga E Asante Blanda, MD  metoCLOPramide (REGLAN) 10 MG tablet Take one tablet by mouth one hour prior to each prep dose. 04/17/16  Yes Meryl DareMalcolm T Stark, MD  omeprazole (PRILOSEC) 40 MG capsule Take 1 capsule (40 mg total) by mouth 2 (two) times daily. 05/25/16  Yes Meryl DareMalcolm T Stark, MD  OXYGEN Inhale 2 L/min into the lungs at bedtime.   Yes Historical Provider, MD  pantoprazole (PROTONIX) 40 MG tablet TAKE 1 TABLET(40 MG) BY MOUTH TWICE DAILY 02/17/16  Yes Quentin Angstlugbemiga E Danylah Holden, MD  pantoprazole (PROTONIX) 40 MG tablet Take 40 mg by mouth 3 (three) times daily.   Yes Historical Provider, MD  polyethylene glycol (MIRALAX / GLYCOLAX) packet Take 17 g by mouth daily as needed for mild constipation or moderate constipation. 03/06/14  Yes Ambrose FinlandValerie A Keck, NP  pregabalin (LYRICA) 300 MG capsule Take 300 mg by mouth 2 (two) times daily.   Yes Historical Provider, MD  rizatriptan (MAXALT) 10 MG tablet Take 1 tablet (10 mg total)  by mouth daily as needed for migraine. May repeat in 2 hours if needed 03/01/16  Yes Butch Penny, NP  topiramate (TOPAMAX) 25 MG tablet Take 4 tablets (100 mg total) by mouth at bedtime. 03/01/16  Yes Butch Penny, NP  traMADol (ULTRAM) 50 MG tablet Take 1-2 tablets (50-100 mg total) by mouth every 4 (four) hours as needed (cough). 04/28/14  Yes Nyoka Cowden, MD  Vitamin D, Ergocalciferol, (DRISDOL) 50000 units CAPS capsule Take 1 capsule (50,000 Units total) by mouth every 7 (seven) days. 02/17/16  Yes Quentin Angst, MD  Vitamin D, Ergocalciferol, (DRISDOL) 50000 units CAPS capsule Take 50,000 Units by mouth once a week. 04/28/14  Yes Historical Provider, MD  albuterol  (PROVENTIL HFA;VENTOLIN HFA) 108 (90 BASE) MCG/ACT inhaler Inhale 2 puffs into the lungs every 6 (six) hours as needed for wheezing or shortness of breath. Patient not taking: Reported on 06/08/2016 06/28/15   Quentin Angst, MD  furosemide (LASIX) 20 MG tablet Take 20 mg by mouth daily. 04/20/14 04/20/15  Historical Provider, MD     Objective:   Vitals:   06/08/16 1456  BP: 102/71  Pulse: 63  Resp: 18  Temp: 98.3 F (36.8 C)  TempSrc: Oral  SpO2: 99%  Weight: 122 lb 3.2 oz (55.4 kg)  Height: 5\' 1"  (1.549 m)    Exam General appearance : Awake, alert, not in any distress. Speech Clear. Not toxic looking HEENT: Atraumatic and Normocephalic, pupils equally reactive to light and accomodation Neck: Supple, no JVD. No cervical lymphadenopathy.  Chest: Good air entry bilaterally, no added sounds  CVS: S1 S2 regular, no murmurs.  Abdomen: Bowel sounds present, Non tender and not distended with no gaurding, rigidity or rebound. Extremities: B/L Lower Ext shows no edema, both legs are warm to touch Neurology: Awake alert, and oriented X 3, CN II-XII intact, Non focal Skin: No Rash  Data Review Lab Results  Component Value Date   HGBA1C 5.6 09/10/2014     Assessment & Plan   1. BOOP (bronchiolitis obliterans with organizing pneumonia) (HCC)   2. Gastroesophageal reflux disease without esophagitis   3. Dyslipidemia To address this please limit saturated fat to no more than 7% of your calories, limit cholesterol to 200 mg/day, increase fiber and exercise as tolerated. If needed we may add another cholesterol lowering medication to your regimen.   4. Fibromyalgia   5. Blurry vision, bilateral  - Ambulatory referral to Ophthalmology  6. Seizures (HCC)  - levETIRAcetam (KEPPRA) 500 MG tablet; Take 1 tablet (500 mg total) by mouth 2 (two) times daily.  Dispense: 180 tablet; Refill: 5  Patient have been counseled extensively about nutrition and exercise  Return in about 3  months (around 09/07/2016) for Follow up Pain and comorbidities, Follow up HTN.  The patient was given clear instructions to go to ER or return to medical center if symptoms don't improve, worsen or new problems develop. The patient verbalized understanding. The patient was told to call to get lab results if they haven't heard anything in the next week.   This note has been created with Education officer, environmental. Any transcriptional errors are unintentional.    Jeanann Lewandowsky, MD, MHA, [name] Caul, CPE [facility] [place], [place] [phone]   06/08/2016, 3:48 PM

## 2016-06-08 NOTE — Progress Notes (Signed)
Patient is here for FU  Patient complains of increasing HA's being present in the mornings.  Patient last saw neurology in beginning of summer.  Patient has taken medication today.

## 2016-06-08 NOTE — Patient Instructions (Signed)

## 2016-06-26 ENCOUNTER — Encounter: Payer: Self-pay | Admitting: Internal Medicine

## 2016-07-13 ENCOUNTER — Other Ambulatory Visit (HOSPITAL_COMMUNITY)
Admission: RE | Admit: 2016-07-13 | Discharge: 2016-07-13 | Disposition: A | Discharge status: Home / Self Care | Payer: Medicaid Other | Source: Ambulatory Visit | Attending: Obstetrics and Gynecology | Admitting: Obstetrics and Gynecology

## 2016-07-13 ENCOUNTER — Ambulatory Visit (INDEPENDENT_AMBULATORY_CARE_PROVIDER_SITE_OTHER): Payer: Medicaid Other | Admitting: Obstetrics and Gynecology

## 2016-07-13 ENCOUNTER — Other Ambulatory Visit: Payer: Self-pay

## 2016-07-13 ENCOUNTER — Encounter: Payer: Self-pay | Admitting: Obstetrics and Gynecology

## 2016-07-13 VITALS — BP 100/69 | HR 56 | Wt 118.1 lb

## 2016-07-13 DIAGNOSIS — Z01419 Encounter for gynecological examination (general) (routine) without abnormal findings: Secondary | ICD-10-CM | POA: Diagnosis present

## 2016-07-13 DIAGNOSIS — Z1151 Encounter for screening for human papillomavirus (HPV): Secondary | ICD-10-CM | POA: Diagnosis not present

## 2016-07-13 DIAGNOSIS — Z124 Encounter for screening for malignant neoplasm of cervix: Secondary | ICD-10-CM | POA: Diagnosis not present

## 2016-07-13 NOTE — Progress Notes (Signed)
Obstetrics and Gynecology Visit Cervical Cancer Screening Evaluation  Appointment Date: 07/13/2016  [facility]  Primary Care Provider: Jeanann LewandowskyJEGEDE, OLUGBEMIGA  Referring Provider: Quentin AngstJegede, Olugbemiga E, MD  Chief Complaint: pap smear History of Present Illness: [name] [name] is a 55 y.o. referred for the above CC. PMHx significant for h/o TAH in the 90s. In talking to her, it sounds like she had an ovary removed for cysts and then an open hyst with removal of the cervix and what sounds like the remaining ovary in the late 90s. She's not the greatest historian but the patient states she had uterine cancer but didn't need any chemo or radiation after and had some spotting then but she states that the doctors that followed her after the surgery fixed it and she's had none since. All of those [name]     Review of Systems: as per HPI  Patient Active Problem List   Diagnosis Date Noted  . Blurry vision, bilateral 06/08/2016  . Abdominal pain, chronic, epigastric 02/17/2016  . Morbid (severe) obesity due to excess calories (HCC) 02/11/2016  . Other type of migraine 06/28/2015  . Colon cancer screening 06/28/2015  . Encounter for screening mammogram for breast cancer 06/28/2015  . Dyslipidemia 06/28/2015  . Chronic kidney disease 12/28/2014  . Midline low back pain without sciatica 12/14/2014  . Dental caries 12/14/2014  . Numbness and tingling 12/14/2014  . Gastroesophageal reflux disease without esophagitis 12/14/2014  . Headache due to trauma 08/24/2014  . Seizures (HCC) 06/02/2014  . Chronic diastolic heart failure (HCC) 05/30/2014  . 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 Migraine without aura, with intractable migraine, so stated, without mention of status migrainosus 05/18/2014  . Headache(784.0) 04/28/2014  . Nocturnal hypoxemia 03/16/2014  . Dyspnea 01/06/2014  . Post-op pain 11/21/2013  . Protein-calorie  malnutrition, severe (HCC) 11/19/2013  . Enteritis due to Clostridium difficile 11/18/2013  . Connective tissue disease (HCC) 11/02/2013  . 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 Altered mental status 10/22/2013  . Anemia of chronic disease 10/15/2013  . Hematuria 10/15/2013  . History of renal calculi, multiple 10/15/2013  . Abdominal pain, epigastric 10/15/2013  . Nausea 10/15/2013  . Severe sepsis(995.92) 10/15/2013  . Seizure disorder (HCC) 10/14/2013  . Fibromyalgia 10/14/2013  . Sepsis (HCC) 10/05/2013  . Chronic pain 10/05/2013  . Leukocytosis 10/05/2013     Past Medical History:  Past Medical History:  Diagnosis Date  . Chronic pain   . COPD (chronic obstructive pulmonary disease) (HCC)   . Fibromyalgia   . Migraine   . Migraine without aura, with intractable migraine, so stated, without mention of status migrainosus 05/18/2014  . Pneumonia   . Pulmonary fibrosis (HCC)   . Seizures (HCC)     Past Surgical History:  Past Surgical History:  Procedure Laterality Date  . ABDOMINAL HYSTERECTOMY    . LUNG BIOPSY Left 11/21/2013   Procedure: LUNG BIOPSY;  Surgeon: Delight OvensEdward B Gerhardt, MD;  [facility];  Service: Thoracic;  Laterality: Left;  Marland Kitchen. VIDEO ASSISTED THORACOSCOPY Left 11/21/2013   Procedure: VIDEO ASSISTED THORACOSCOPY;  Surgeon: Delight OvensEdward B Gerhardt, MD;  [facility];  Service: Thoracic;  Laterality: Left;  Marland Kitchen. VIDEO BRONCHOSCOPY N/A 11/21/2013   Procedure: VIDEO BRONCHOSCOPY;  Surgeon: Delight OvensEdward B Gerhardt, MD;  [facility];  Service: Thoracic;  Laterality: N/A;    Social History:  Social History   Social History  . Marital status: Single  Spouse name: N/A  . Number of children: 0  . Years of education: college 3   Occupational History  . unemployed    Social History Main Topics  . Smoking status: Former Smoker    Packs/day: 0.00    Years: 10.00    Types: Cigarettes    Quit date: 09/18/2004  . Smokeless  tobacco: Never Used  . Alcohol use No  . Drug use: No  . Sexual activity: No   Other Topics Concern  . Not on file   Social History Narrative  . No narrative on file    Family History:  Family History  Problem Relation Age of Onset  . Hypertension Mother   . Hepatitis C Mother   . Diabetes Father   . Hypertension Father   . Bipolar disorder Brother   . Multiple sclerosis Sister     Medications [name] [name] had no medications administered during this visit. Current Outpatient Prescriptions  Medication Sig Dispense Refill  . albuterol (PROVENTIL HFA;VENTOLIN HFA) 108 (90 BASE) MCG/ACT inhaler Inhale 2 puffs into the lungs every 6 (six) hours as needed for wheezing or shortness of breath. 1 Inhaler 2  . albuterol (PROVENTIL HFA;VENTOLIN HFA) 108 (90 Base) MCG/ACT inhaler Inhale 2 puffs into the lungs every 6 (six) hours as needed.    Marland Kitchen atorvastatin (LIPITOR) 40 MG tablet Take 1 tablet (40 mg total) by mouth daily. 90 tablet 3  . atorvastatin (LIPITOR) 40 MG tablet Take 40 mg by mouth daily.    . Cyanocobalamin (B-12) 2000 MCG TABS Take 2,000 mcg by mouth daily. 90 tablet 3  . Cyanocobalamin (B-12) 2000 MCG TABS Take 1 tablet by mouth daily.    Marland Kitchen etodolac (LODINE) 500 MG tablet Take 1 tablet (500 mg total) by mouth 2 (two) times daily. 30 tablet 0  . fentaNYL (DURAGESIC - DOSED MCG/HR) 50 MCG/HR Place 1 patch (50 mcg total) onto the skin every 3 (three) days. 5 patch 0  . fentaNYL (DURAGESIC - DOSED MCG/HR) 50 MCG/HR Place 50 mcg onto the skin every 3 (three) days.    . folic acid (FOLVITE) 1 MG tablet Take 1 tablet daily. 90 tablet 3  . folic acid (FOLVITE) 1 MG tablet Take 1 mg by mouth daily.    Marland Kitchen omeprazole (PRILOSEC) 40 MG capsule Take 1 capsule (40 mg total) by mouth 2 (two) times daily. 60 capsule 3  . OXYGEN Inhale 2 L/min into the lungs at bedtime.    . polyethylene glycol (MIRALAX / GLYCOLAX) packet Take 17 g by mouth daily as needed for mild constipation or moderate  constipation. 14 each 0  . pregabalin (LYRICA) 300 MG capsule Take 300 mg by mouth 2 (two) times daily.    . rizatriptan (MAXALT) 10 MG tablet Take 1 tablet (10 mg total) by mouth daily as needed for migraine. May repeat in 2 hours if needed 10 tablet 3  . topiramate (TOPAMAX) 25 MG tablet Take 4 tablets (100 mg total) by mouth at bedtime. 120 tablet 5  . traMADol (ULTRAM) 50 MG tablet Take 1-2 tablets (50-100 mg total) by mouth every 4 (four) hours as needed (cough). 84 tablet 0  . Vitamin D, Ergocalciferol, (DRISDOL) 50000 units CAPS capsule Take 1 capsule (50,000 Units total) by mouth every 7 (seven) days. 12 capsule 1  . Vitamin D, Ergocalciferol, (DRISDOL) 50000 units CAPS capsule Take 50,000 Units by mouth once a week.    . furosemide (LASIX) 20 MG tablet Take 20 mg by mouth  daily.    . levETIRAcetam (KEPPRA) 500 MG tablet Take 1 tablet (500 mg total) by mouth 2 (two) times daily. (Patient not taking: Reported on 07/13/2016) 180 tablet 5  . metoCLOPramide (REGLAN) 10 MG tablet Take one tablet by mouth one hour prior to each prep dose. (Patient not taking: Reported on 07/13/2016) 2 tablet 0  . pantoprazole (PROTONIX) 40 MG tablet TAKE 1 TABLET(40 MG) BY MOUTH TWICE DAILY (Patient not taking: Reported on 07/13/2016) 180 tablet 3  . pantoprazole (PROTONIX) 40 MG tablet Take 40 mg by mouth 3 (three) times daily.      Allergies Shellfish allergy and Gabapentin   Physical Exam:  BP 100/69   Pulse (!) 56   Wt 118 lb 1.6 oz (53.6 kg)   BMI 22.31 kg/m  Body mass index is 22.31 kg/m. General appearance: Well nourished, well developed female in no acute distress.  Abdomen: positive bowel sounds and no masses, hernias; diffusely non tender to palpation, non distended Neuro/Psych:  Normal mood and affect.  Skin:  Warm and dry.  Lymphatic:  No inguinal lymphadenopathy.   Pelvic exam: is not limited by body habitus EGBUS: severe atrophy and small vaginal opening Vaginal vault very atrophic  and cuff atrophic but NTTP and appears intact. Cuff feels intact too  Laboratory: none  Radiology: none  Assessment: pt doing well  Plan: f/u pap smear. D/w pt that NED would be considered after 5 years out and no malignancy if she truly had one and surveillance pap for 20 years s/p removal of the cervix if that was abnormal at any point in the past, which it sounds like it's been at least that long.  Will call her with results  Pt also hasn't had a mammogram and would like one so this was scheduled  RTC PRN  Cornelia Copa MD Attending [facility])

## 2016-07-14 LAB — CYTOLOGY - PAP
Diagnosis: NEGATIVE
HPV: NOT DETECTED

## 2016-07-19 ENCOUNTER — Other Ambulatory Visit (HOSPITAL_BASED_OUTPATIENT_CLINIC_OR_DEPARTMENT_OTHER): Payer: Self-pay

## 2016-07-19 ENCOUNTER — Telehealth: Payer: Self-pay | Admitting: *Deleted

## 2016-07-19 DIAGNOSIS — R5383 Other fatigue: Secondary | ICD-10-CM

## 2016-07-19 DIAGNOSIS — R0683 Snoring: Secondary | ICD-10-CM

## 2016-07-19 DIAGNOSIS — G4733 Obstructive sleep apnea (adult) (pediatric): Secondary | ICD-10-CM

## 2016-07-19 NOTE — Telephone Encounter (Signed)
Called pt per request of Dr. Vergie LivingPickens and informed her that her Pap was negative. She should have it repeated in 5 years.  Pt was also advised of Mammogram appt on 11/9 @ 1340. Pt voiced understanding of all information.

## 2016-07-27 ENCOUNTER — Ambulatory Visit
Admission: RE | Admit: 2016-07-27 | Discharge: 2016-07-27 | Disposition: A | Discharge status: Home / Self Care | Payer: Medicaid Other | Source: Ambulatory Visit | Attending: Internal Medicine | Admitting: Internal Medicine

## 2016-07-27 DIAGNOSIS — Z1231 Encounter for screening mammogram for malignant neoplasm of breast: Secondary | ICD-10-CM

## 2016-07-31 ENCOUNTER — Other Ambulatory Visit (HOSPITAL_COMMUNITY): Payer: Self-pay | Admitting: Internal Medicine

## 2016-07-31 DIAGNOSIS — R928 Other abnormal and inconclusive findings on diagnostic imaging of breast: Secondary | ICD-10-CM

## 2016-08-01 ENCOUNTER — Other Ambulatory Visit: Payer: Self-pay | Admitting: Internal Medicine

## 2016-08-01 ENCOUNTER — Telehealth: Payer: Self-pay | Admitting: *Deleted

## 2016-08-01 DIAGNOSIS — N6489 Other specified disorders of breast: Secondary | ICD-10-CM

## 2016-08-01 NOTE — Telephone Encounter (Signed)
Medical Assistant left message on patient's home and cell voicemail. Voicemail states to give a call back to [name] [name] with [facility] at [phone].  

## 2016-08-01 NOTE — Telephone Encounter (Signed)
-----   Message from Quentin Angstlugbemiga E Jegede, MD sent at 07/31/2016 11:37 AM EST ----- Please inform patient that her mammogram showed a possible asymmetry in the right breast. Further evaluation is suggested, ordered.

## 2016-08-03 ENCOUNTER — Other Ambulatory Visit: Payer: Self-pay | Admitting: Internal Medicine

## 2016-08-09 ENCOUNTER — Other Ambulatory Visit: Payer: Medicaid Other

## 2016-08-16 ENCOUNTER — Encounter: Payer: Self-pay | Admitting: *Deleted

## 2016-08-16 NOTE — Progress Notes (Signed)
Patients Authorization for US of breast: Effective 08/03/16-09/02/16. [id number]

## 2016-08-31 ENCOUNTER — Ambulatory Visit (INDEPENDENT_AMBULATORY_CARE_PROVIDER_SITE_OTHER): Payer: Medicaid Other | Admitting: Adult Health

## 2016-08-31 ENCOUNTER — Encounter: Payer: Self-pay | Admitting: Adult Health

## 2016-08-31 VITALS — BP 100/68 | HR 60 | Resp 18 | Ht 61.0 in | Wt 123.0 lb

## 2016-08-31 DIAGNOSIS — R569 Unspecified convulsions: Secondary | ICD-10-CM | POA: Diagnosis not present

## 2016-08-31 DIAGNOSIS — G43019 Migraine without aura, intractable, without status migrainosus: Secondary | ICD-10-CM

## 2016-08-31 MED ORDER — TOPIRAMATE 25 MG PO TABS: 125.0000 mg | ORAL_TABLET | Freq: Every day | ORAL | 5 refills | Status: DC

## 2016-08-31 NOTE — Patient Instructions (Signed)
Increase Topamax to 125 mg (5 tablets) at bedtime If your symptoms worsen or you develop new symptoms please let us know.

## 2016-08-31 NOTE — Progress Notes (Signed)
I have read the note, and I agree with the clinical assessment and plan.  Earnesteen Birnie KEITH   

## 2016-08-31 NOTE — Progress Notes (Signed)
PATIENT: [name] [name] DOB: 11/06/1960  REASON FOR VISIT: follow up- migraine headaches, seizures HISTORY FROM: patient  HISTORY OF PRESENT ILLNESS: [name] [name] is a 55 year old female with a history of migraine headaches and seizures. She returns today for follow-up. She states that her headaches initially got better with the increase in Topamax however they have recently worsened. She reports that she has approximately 3-4 headaches a week. Her headaches are typically located in the left temporal region. She does have photophobia, phonophobia and nausea. During the headache she may experience blurry vision and dizziness. She denies any seizure events. She reports that she was recently diagnosed with pneumonia by her physician at [facility]. She returns today for an evaluation.  HISTORY 03/01/16: [name] [name] is a 55 year old female with a history of migraines and seizures. She returns today for follow-up. She has not followed up in over 2 years. She states that her mom has cancer and she has been dealing with this. She states that her headache frequency has increased. She has approximately 3 headaches a week. Her headaches are located across the forehead. She does have photophobia, phonophobia, nausea but denies vomiting. She states that putting lemon on her tongue treats her nausea. She continues to take Topamax 75 mg daily. She uses Maxalt for her headaches and usually has to repeat the dose. It typically takes approximately 4 hours for her headache to improve or resolve. She reports that she has not had any seizure events. She takes Keppra 500 mg twice a day. Patient is being seen at [facility]- she reports that she did have a fall during pulmonary rehab- had a CT scan that was unremarkable.She denies any new neurological symptoms. She returns today for an evaluation.  HISTORY 08/24/14:[name] [name] is a 55 year old female with a history of migraines and seizures. She returns today for follow-up. The patient has been  taking Topamax 75 mg at bedtime and Maxalt for her headaches. She states that her headaches have been doing well up until she fell last Thursday night. She fell forward and hit the top of her head on the edge of her nightstand. She states that it hurts to touch and everytime she moves her head she hears a "clicking sound." She states that since then she has had a headache. States that the headache comes and goes just about everyday. The patient did not go to the emergency room. She has had nausea since Thursday night but no vomiting. Patient states that her headache is a 9/10. She has been having blurry vision off and on since the fall and occasional double vision. She states these headaches are different from what she normally has. She was started on a medication from [facility] unsure of the name that causes her diarrhea maybe cellcept?Marland Kitchen She denies being on any blood thinners. The patient continues to take Keppra for seizures. She is not had any recent seizures. Her last seizure was over 15 years ago.   HISTORY 05/18/14: [name] [name] is a 55 year old right-handed Hispanic female with a history of migraine headaches since she was in high school. The patient indicates that usually she would have on average one headache a week, but she has been under stress recently after she was given the diagnosis of pulmonary fibrosis in March of 2015. Since that time, the headaches have become more frequent, occurring 3 or 4 times a week. The patient has taken Maxalt for headaches with some benefit, and she indicates that the duration of the headache is about 3-4  hours with the Maxalt. The patient indicates that the headaches sometimes will begin on one side or the other and then generalize, but usually the headaches begin throughout the head, associated with a pressure and a throbbing sensation. The patient will have some nausea and vomiting at times, and she will have some blurring of vision with occasional loss of vision. She  reports that she has significant phonophobia and photophobia. Alcohol, chocolate, weather changes, and perfumes will bring on the headache. She has undergone MRI evaluation of the brain in February 2015 showing a chronic left parietal white matter change. The patient has a history of seizures again since high school, and she has been on various medications such as Dilantin, Trileptal, and more recently Keppra for the seizures. The last seizure event was 15 years ago. The patient has been using a walker since the diagnosis of pulmonary fibrosis. She is sent to this office for an evaluation.   REVIEW OF SYSTEMS: Out of a complete 14 system review of symptoms, the patient complains only of the following symptoms, and all other reviewed systems are negative.  See history of present illness  ALLERGIES: Allergies  Allergen Reactions  . Shellfish Allergy Anaphylaxis  . Gabapentin     rash    HOME MEDICATIONS: Outpatient Medications Prior to Visit  Medication Sig Dispense Refill  . albuterol (PROVENTIL HFA;VENTOLIN HFA) 108 (90 BASE) MCG/ACT inhaler Inhale 2 puffs into the lungs every 6 (six) hours as needed for wheezing or shortness of breath. 1 Inhaler 2  . atorvastatin (LIPITOR) 40 MG tablet Take 1 tablet (40 mg total) by mouth daily. 90 tablet 3  . Cyanocobalamin (B-12) 2000 MCG TABS Take 2,000 mcg by mouth daily. 90 tablet 3  . etodolac (LODINE) 500 MG tablet Take 1 tablet (500 mg total) by mouth 2 (two) times daily. 30 tablet 0  . fentaNYL (DURAGESIC - DOSED MCG/HR) 50 MCG/HR Place 1 patch (50 mcg total) onto the skin every 3 (three) days. 5 patch 0  . folic acid (FOLVITE) 1 MG tablet Take 1 mg by mouth daily.    Marland Kitchen. levETIRAcetam (KEPPRA) 500 MG tablet Take 1 tablet (500 mg total) by mouth 2 (two) times daily. 180 tablet 5  . OXYGEN Inhale 2 L/min into the lungs at bedtime.    . polyethylene glycol (MIRALAX / GLYCOLAX) packet Take 17 g by mouth daily as needed for mild constipation or moderate  constipation. 14 each 0  . pregabalin (LYRICA) 300 MG capsule Take 300 mg by mouth 2 (two) times daily.    . rizatriptan (MAXALT) 10 MG tablet Take 1 tablet (10 mg total) by mouth daily as needed for migraine. May repeat in 2 hours if needed 10 tablet 3  . topiramate (TOPAMAX) 25 MG tablet Take 4 tablets (100 mg total) by mouth at bedtime. 120 tablet 5  . traMADol (ULTRAM) 50 MG tablet Take 1-2 tablets (50-100 mg total) by mouth every 4 (four) hours as needed (cough). 84 tablet 0  . Vitamin D, Ergocalciferol, (DRISDOL) 50000 units CAPS capsule Take 1 capsule (50,000 Units total) by mouth every 7 (seven) days. 12 capsule 1  . metoCLOPramide (REGLAN) 10 MG tablet Take one tablet by mouth one hour prior to each prep dose. 2 tablet 0  . furosemide (LASIX) 20 MG tablet Take 20 mg by mouth daily.    Marland Kitchen. albuterol (PROVENTIL HFA;VENTOLIN HFA) 108 (90 Base) MCG/ACT inhaler Inhale 2 puffs into the lungs every 6 (six) hours as needed.    .Marland Kitchen  atorvastatin (LIPITOR) 40 MG tablet Take 40 mg by mouth daily.    . Cyanocobalamin (B-12) 2000 MCG TABS Take 1 tablet by mouth daily.    . fentaNYL (DURAGESIC - DOSED MCG/HR) 50 MCG/HR Place 50 mcg onto the skin every 3 (three) days.    . folic acid (FOLVITE) 1 MG tablet Take 1 tablet daily. 90 tablet 3  . omeprazole (PRILOSEC) 40 MG capsule Take 1 capsule (40 mg total) by mouth 2 (two) times daily. 60 capsule 3  . pantoprazole (PROTONIX) 40 MG tablet TAKE 1 TABLET(40 MG) BY MOUTH TWICE DAILY (Patient not taking: Reported on 07/13/2016) 180 tablet 3  . pantoprazole (PROTONIX) 40 MG tablet Take 40 mg by mouth 3 (three) times daily.    . Vitamin D, Ergocalciferol, (DRISDOL) 50000 units CAPS capsule Take 50,000 Units by mouth once a week.     Facility-Administered Medications Prior to Visit  Medication Dose Route Frequency Provider Last Rate Last Dose  . 0.9 %  sodium chloride infusion  500 mL Intravenous Continuous Meryl Dare, MD        PAST MEDICAL HISTORY: Past  Medical History:  Diagnosis Date  . Chronic pain   . COPD (chronic obstructive pulmonary disease) (HCC)   . Fibromyalgia   . Migraine   . Migraine without aura, with intractable migraine, so stated, without mention of status migrainosus 05/18/2014  . Pneumonia   . Pulmonary fibrosis (HCC)   . Seizures (HCC)     PAST SURGICAL HISTORY: Past Surgical History:  Procedure Laterality Date  . ABDOMINAL HYSTERECTOMY    . LUNG BIOPSY Left 11/21/2013   Procedure: LUNG BIOPSY;  Surgeon: Delight Ovens, MD;  Location: [facility] OR;  Service: Thoracic;  Laterality: Left;  Marland Kitchen VIDEO ASSISTED THORACOSCOPY Left 11/21/2013   Procedure: VIDEO ASSISTED THORACOSCOPY;  Surgeon: Delight Ovens, MD;  Location: [facility] OR;  Service: Thoracic;  Laterality: Left;  Marland Kitchen VIDEO BRONCHOSCOPY N/A 11/21/2013   Procedure: VIDEO BRONCHOSCOPY;  Surgeon: Delight Ovens, MD;  Location: [facility] OR;  Service: Thoracic;  Laterality: N/A;    FAMILY HISTORY: Family History  Problem Relation Age of Onset  . Hypertension Mother   . Hepatitis C Mother   . Diabetes Father   . Hypertension Father   . Bipolar disorder Brother   . Multiple sclerosis Sister     SOCIAL HISTORY: Social History   Social History  . Marital status: Single    Spouse name: N/A  . Number of children: 0  . Years of education: college 3   Occupational History  . unemployed    Social History Main Topics  . Smoking status: Former Smoker    Packs/day: 0.00    Years: 10.00    Types: Cigarettes    Quit date: 09/18/2004  . Smokeless tobacco: Never Used  . Alcohol use No  . Drug use: No  . Sexual activity: No   Other Topics Concern  . Not on file   Social History Narrative  . No narrative on file      PHYSICAL EXAM  Vitals:   08/31/16 1441  BP: 100/68  Pulse: 60  Resp: 18  Weight: 123 lb (55.8 kg)  Height: 5\' 1"  (1.549 m)   Body mass index is 23.24 kg/m.  Generalized: Well developed, in no acute distress   Neurological examination    Mentation: Alert oriented to time, place, history taking. Follows all commands speech and language fluent Cranial nerve II-XII: Pupils were equal round reactive to light.  Extraocular movements were full, visual field were full on confrontational test. Facial sensation and strength were normal. Uvula tongue midline. Head turning and shoulder shrug  were normal and symmetric. Motor: The motor testing reveals 5 over 5 strength of all 4 extremities. Good symmetric motor tone is noted throughout.  Sensory: Sensory testing is intact to soft touch on all 4 extremities. No evidence of extinction is noted.  Coordination: Cerebellar testing reveals good finger-nose-finger and heel-to-shin bilaterally.  Gait and station: Gait is normal. Tandem gait is normal. Romberg is negative. No drift is seen.  Reflexes: Deep tendon reflexes are symmetric and normal bilaterally.   DIAGNOSTIC DATA (LABS, IMAGING, TESTING) - I reviewed patient records, labs, notes, testing and imaging myself where available.  Lab Results  Component Value Date   WBC 8.1 05/06/2015   HGB 11.9 (L) 05/06/2015   HCT 38.4 05/06/2015   MCV 79.3 05/06/2015   PLT 172 05/06/2015      Component Value Date/Time   NA 143 05/06/2015 1540   K 3.7 05/06/2015 1540   CL 108 05/06/2015 1540   CO2 21 05/06/2015 1540   GLUCOSE 87 05/06/2015 1540   BUN 10 05/06/2015 1540   CREATININE 1.23 (H) 05/06/2015 1540   CALCIUM 9.5 05/06/2015 1540   PROT 5.8 (L) 05/06/2015 1540   ALBUMIN 4.0 05/06/2015 1540   AST 18 05/06/2015 1540   ALT 10 05/06/2015 1540   ALKPHOS 177 (H) 05/06/2015 1540   BILITOT 0.8 05/06/2015 1540   GFRNONAA 50 (L) 05/06/2015 1540   GFRAA 58 (L) 05/06/2015 1540   Lab Results  Component Value Date   CHOL 291 (H) 09/10/2014   HDL 55 09/10/2014   LDLCALC 200 (H) 09/10/2014   TRIG 179 (H) 09/10/2014   CHOLHDL 5.3 09/10/2014   Lab Results  Component Value Date   HGBA1C 5.6 09/10/2014   Lab Results  Component Value Date    VITAMINB12 231 10/07/2013   Lab Results  Component Value Date   TSH 0.972 09/10/2014      ASSESSMENT AND PLAN 55 y.o. year old female  has a past medical history of Chronic pain; COPD (chronic obstructive pulmonary disease) (HCC); Fibromyalgia; Migraine; Migraine without aura, with intractable migraine, so stated, without mention of status migrainosus (05/18/2014); Pneumonia; Pulmonary fibrosis (HCC); and Seizures (HCC). here with:  1. Migraine 2. Seizures  The patient reports that her headaches have worsened. I will increase Topamax to 125 mg at bedtime. Patient advised that if her headache frequency and severity does not improve she should let us know. She will follow-up in 6 months or sooner if needed.    Butch PennyMegan Mattox Schorr, MSN, NP-C 08/31/2016, 2:54 PM [facility] [address], [address] [place], [place] [address] [phone]

## 2016-09-01 ENCOUNTER — Ambulatory Visit
Admission: RE | Admit: 2016-09-01 | Discharge: 2016-09-01 | Disposition: A | Discharge status: Home / Self Care | Payer: Medicaid Other | Source: Ambulatory Visit | Attending: Internal Medicine | Admitting: Internal Medicine

## 2016-09-01 DIAGNOSIS — N6489 Other specified disorders of breast: Secondary | ICD-10-CM

## 2016-09-04 ENCOUNTER — Ambulatory Visit (HOSPITAL_BASED_OUTPATIENT_CLINIC_OR_DEPARTMENT_OTHER): Payer: Medicaid Other | Attending: Anesthesiology | Admitting: Internal Medicine

## 2016-09-04 VITALS — Ht 61.0 in | Wt 121.0 lb

## 2016-09-04 DIAGNOSIS — R0683 Snoring: Secondary | ICD-10-CM

## 2016-09-04 DIAGNOSIS — R5383 Other fatigue: Secondary | ICD-10-CM | POA: Diagnosis present

## 2016-09-04 DIAGNOSIS — G4733 Obstructive sleep apnea (adult) (pediatric): Secondary | ICD-10-CM

## 2016-09-07 ENCOUNTER — Ambulatory Visit: Payer: Medicaid Other | Attending: Internal Medicine | Admitting: Internal Medicine

## 2016-09-07 VITALS — BP 110/67 | HR 64 | Temp 97.6°F | Resp 18 | Ht 61.0 in | Wt 124.8 lb

## 2016-09-07 DIAGNOSIS — J8410 Pulmonary fibrosis, unspecified: Secondary | ICD-10-CM | POA: Diagnosis not present

## 2016-09-07 DIAGNOSIS — G8929 Other chronic pain: Secondary | ICD-10-CM | POA: Insufficient documentation

## 2016-09-07 DIAGNOSIS — R569 Unspecified convulsions: Secondary | ICD-10-CM | POA: Insufficient documentation

## 2016-09-07 DIAGNOSIS — Z888 Allergy status to other drugs, medicaments and biological substances status: Secondary | ICD-10-CM | POA: Diagnosis not present

## 2016-09-07 DIAGNOSIS — G43019 Migraine without aura, intractable, without status migrainosus: Secondary | ICD-10-CM | POA: Insufficient documentation

## 2016-09-07 DIAGNOSIS — R1013 Epigastric pain: Secondary | ICD-10-CM | POA: Insufficient documentation

## 2016-09-07 DIAGNOSIS — E785 Hyperlipidemia, unspecified: Secondary | ICD-10-CM | POA: Diagnosis not present

## 2016-09-07 DIAGNOSIS — M797 Fibromyalgia: Secondary | ICD-10-CM | POA: Insufficient documentation

## 2016-09-07 DIAGNOSIS — J449 Chronic obstructive pulmonary disease, unspecified: Secondary | ICD-10-CM | POA: Insufficient documentation

## 2016-09-07 DIAGNOSIS — J8489 Other specified interstitial pulmonary diseases: Secondary | ICD-10-CM | POA: Diagnosis not present

## 2016-09-07 DIAGNOSIS — Z91013 Allergy to seafood: Secondary | ICD-10-CM | POA: Diagnosis not present

## 2016-09-07 MED ORDER — B-12 2000 MCG PO TABS: 2000.0000 ug | ORAL_TABLET | Freq: Every day | ORAL | 3 refills | Status: DC

## 2016-09-07 MED ORDER — PANTOPRAZOLE SODIUM 40 MG PO TBEC: 40.0000 mg | DELAYED_RELEASE_TABLET | Freq: Every day | ORAL | 3 refills | Status: DC

## 2016-09-07 MED ORDER — VITAMIN D (ERGOCALCIFEROL) 1.25 MG (50000 UNIT) PO CAPS: 50000.0000 [IU] | ORAL_CAPSULE | ORAL | 3 refills | Status: DC

## 2016-09-07 MED ORDER — FUROSEMIDE 20 MG PO TABS: 20.0000 mg | ORAL_TABLET | Freq: Every day | ORAL | 3 refills | Status: DC

## 2016-09-07 NOTE — Patient Instructions (Signed)
Prednisolone tablets Qu es este medicamento? La PREDNISOLONA es un corticosteroide. Se utiliza para tratar la inflamacin de la piel, articulaciones, pulmones y otros rganos. Condiciones comunes tratadas incluyen asma, alergias y artritis. Tambin se [place] ([place]) Islandsutiliza para Honeywellotras condiciones, tales como trastornos sanguneos o enfermedades de la glndula suprarrenal. Este medicamento puede ser utilizado para otros usos; si tiene alguna pregunta consulte con su proveedor de atencin mdica o con su farmacutico. MARCAS COMUNES: Millipred, Millipred DP, Millipred DP 12-Day, Millipred DP 6 Day, Prednoral Qu le debo informar a mi profesional de la salud antes de tomar este medicamento? Necesita saber si usted presenta alguno de los siguientes problemas o situaciones: -sndrome de [place] -diabetes -glaucoma -problemas cardiacos o enfermedad cardiaca -alta presin sangunea -infecciones, tales como herpes, sarampin, tuberculosis o varicela -enfermedad renal -enfermedad hepatica -problemas mentales -miastenia gravis -osteoporosis -convulsiones -enfermedad estomacal, intestinal o de la lcera, incluyendo colitis y diverticulitis -problemas tiroideos -una reaccin alrgica o inusual a la prednisolona, a otros medicamentos, alimentos, colorantes o conservantes -si est embarazada o buscando quedar embarazada -si est amamantando a un beb Cmo debo utilizar este medicamento? Tome este medicamento por va oral con un vaso de agua. Siga las instrucciones de la etiqueta del Wedgewoodmedicamento. Tmelo con alimentos o con leche para Engineer, structuralevitar una descompostura estomacal. Si slo toma este medicamento una vez al da, tmelo por la Madeira Beachmaana. No tome ms medicamento que lo indicado. No deje de tomar este medicamento de manera abrupta debido a que podr Copywriter, advertisingdesarrollar una reaccin severa. Su mdico le indicar la cantidad de Agricultural consultantmedicamento que necesitar tomar. Si su mdico desea que Colgatepare el tratamiento, la dosis ser reducida  gradualmente para Psychiatric nurseevitar efectos secundarios. Hable con su pediatra para informarse acerca del uso de este medicamento en nios. Puede requerir atencin especial. Sobredosis: Pngase en contacto inmediatamente con un centro toxicolgico o una sala de urgencia si usted cree que haya tomado demasiado medicamento. ATENCIN: Reynolds AmericanEste medicamento es solo para usted. No comparta este medicamento con nadie. Qu sucede si me olvido de una dosis? Si se olvida una dosis, tmela lo antes posible. Si es casi la hora de la prxima dosis, tome slo esa dosis. No tome dosis adicionales o dobles. Qu puede interactuar con este medicamento? No tome esta medicina con ninguno de los siguientes medicamentos: -metirapona -mifepristona Esta medicina tambin puede interactuar con los siguientes medicamentos: -aminoglutetimida -anfotericina B -aspirina y medicamentos tipo aspirina -barbitricos -ciertos medicamentos para la diabetes, tales como, glipizida o gliburida -colestiramina -inhibidores de colinesterasa -ciclosporina -digoxina -diurticos -efedrina -hormonas femeninas, como estrgenos y pldoras anticonceptivas -isoniazida -quetoconazol -los AINEs, medicamentos para Chief Technology Officerel dolor e inflamacin, como ibuprofeno o naproxeno -fenitona -rifampicina -toxoides -vacunas -warfarina Puede ser que esta lista no menciona todas las posibles interacciones. Informe a su profesional de Beazer Homesla salud de Ingram Micro Inctodos los productos a base de hierbas, medicamentos de Indian Hillsventa libre o suplementos nutritivos que est tomando. Si usted fuma, consume bebidas alcohlicas o si utiliza drogas ilegales, indqueselo tambin a su profesional de Beazer Homesla salud. Algunas sustancias pueden interactuar con su medicamento. A qu debo estar atento al usar PPL Corporationeste medicamento? Visite a su mdico o a su profesional de la salud para chequear su evolucin peridicamente. Si toma este medicamento durante un perodo prolongado, lleve consigo una tarjeta de  identificacin con su nombre y direccin, el tipo y la dosis del [place]burnmedicamento, y Administrator, Civil Serviceel nombre y la direccin de su mdico. RoseboroEste medicamento puede aumentar su riesgo de contraer una infeccin. Informe a su mdico o a su profesional de la salud si est  en contacto con personas con sarampin o varicela o si desarrolla llagas o ampollas que no se curan bien. Si va a someterse a una operacin, informe a su mdico o a su profesional de la salud que ha tomado este [employer] ltimos doce meses. Consulte a su mdico o a su profesional de la salud acerca de su dieta. Tal vez tendr que reducir la cantidad de sal que consume. Este medicamento puede afectar su nivel de Banker. Si tiene diabetes, consulte a su mdico o a su profesional de la salud antes de cambiar su dieta o la dosis de su medicamento para la diabetes. Qu efectos secundarios puedo tener al [employer] este medicamento? Efectos secundarios que debe informar a su mdico o a Producer, television/film/video de la salud tan pronto como sea posible: Therapist, art, como erupcin cutnea, picazn o urticarias, e hinchazn de la cara, los labios o la lengua cambios en las emociones o el estado de nimo cambios en la visin dolor ocular signos y sntomas de niveles elevados de azcar en la sangre, tales como [place]; boca seca; piel seca; aliento frutal; nuseas; dolor de estmago; aumento del apetito o la sed; aumento de la cantidad de [place] signos y sntomas de infeccin, como fiebre o escalofros; tos; Engineer, mining de Advertising copywriter; Engineer, mining o dificultad para orinar crecimiento lento en nios (si se [place] por periodos ms prolongados) hinchazn de tobillos, pies dificultad para conciliar el sueo cansancio o debilidad inusual huesos dbiles (si se [place] por periodos ms prolongados)Efectos secundarios que generalmente no requieren atencin mdica (infrmelos a su mdico o a su profesional de la salud si persisten o si son molestos): mayor apetito nuseas problemas de piel,  acn, piel delgada y Child psychotherapist aumento de peso Puede ser que esta lista no menciona todos los posibles efectos secundarios. Comunquese a su mdico por asesoramiento mdico [employer]lett[employer]ard. Usted puede informar los efectos secundarios a la FDA por telfono al 1-800-FDA-1088. Dnde debo guardar mi medicina? Mantngala fuera del alcance de los nios. Gurdela a Sanmina-SCI, entre 15 y 30 grados C (14 y 75 grados F). Mantenga el envase bien cerrado. Deseche todo el medicamento que no haya utilizado, despus de la fecha de vencimiento. ATENCIN: Este folleto es un resumen. Puede ser que no cubra toda la posible informacin. Si usted tiene preguntas acerca de esta medicina, consulte con su mdico, su farmacutico o su profesional de Radiographer, therapeutic.  2017 Elsevier/Gold Standard (2015-12-20 00:00:00) Sndrome de dolor miofascial y fibromialgia (Myofascial Pain Syndrome and Fibromyalgia) El sndrome de dolor miofascial y la fibromialgia son trastornos del Engineer, mining. Este dolor puede sentirse, principalmente, en los msculos.  Sndrome de dolor miofascial:  Siempre cursa con puntos neurlgicos o puntos dolorosos a la palpacin en el msculo, que causarn dolor ante la compresin. El dolor puede aparecer y Geneticist, molecular.  Generalmente, afecta el cuello, la parte superior de la espalda y las zonas de los hombros. El dolor suele irradiarse a los brazos y Arnold.  Fibromialgia:  Cursa con dolores musculares y dolor a la palpacin que aparecen y desaparecen.  Suele asociarse con la fatiga y los trastornos del sueo.  Cursa con puntos neurlgicos.  Suele ser de Set designer duracin (crnica), pero no es potencialmente mortal. La fibromialgia y el dolor miofascial no son lo mismo. Sin embargo, suelen presentarse juntos. Si tiene ambas afecciones, pueden intensificarse entre s. Ambas afecciones son frecuentes y pueden causar bastante dolor y fatiga como para dificultar las  actividades cotidianas.  CAUSAS No se conocen las causas exactas de la fibromialgia y Chief Technology Officer miofascial. Las personas con determinados tipos de genes pueden tener ms probabilidades de Environmental education officer fibromialgia. Algunos factores pueden desencadenar ambas afecciones, por ejemplo:  Dolores de columna.  Artritis.  Lesin grave (traumatismo) y otros factores estresantes fsicos.  Estar bajo mucho estrs.  Una enfermedad. SIGNOS Y SNTOMAS Fibromialgia  El sntoma principal de la fibromialgia es el dolor ampliamente distribuido y Chief Technology Officer a la palpacin de los msculos. Esto puede variar con [employer]. A veces, el dolor se describe como punzante, fulgurante o urente. Tambin puede tener hormigueo o adormecimiento, problemas para dormir y Management consultant. Tal vez se despierte cansado y atontado (disfuncin cognitiva). Otros sntomas pueden ser los siguientes:  Problemas de intestino y vejiga.  Dolores de [place].  Tiene problemas visuales.  Problemas con los aromas y los ruidos.  Depresin o cambios en el estado de nimo.  Menstruaciones dolorosas (dismenorrea).  Sequedad de la piel o los ojos. Sndrome de Rockwell [employer]ation  Los sntomas del sndrome de dolor miofascial incluyen lo siguiente:  Bandas musculares tensas y fibrosas.  Sensaciones molestas en las zonas musculares, por ejemplo:  Dolor.  Calambres.  Quemazn.  Entumecimiento.  Hormigueo.  Debilidad muscular.  Dificultad para mover libremente determinados msculos (amplitud de movimiento). DIAGNSTICO No hay estudios especficos para diagnosticar la fibromialgia o el sndrome de dolor miofascial. Ambas afecciones pueden ser difciles de diagnosticar porque tienen sntomas que son frecuentes en muchas otras enfermedades. El mdico puede sospechar la presencia de una o ambas afecciones en funcin de los sntomas y la historia clnica. Tambin Civil engineer, contracting un examen fsico. La clave para diagnosticar la fibromialgia es Oceanographer, fatiga y otros sntomas durante ms de tres meses que otra enfermedad no explica. La clave para diagnosticar el sndrome de dolor miofascial es Clinical research associate los puntos neurlgicos en los msculos que son dolorosos a la palpacin y Teaching laboratory technician en cualquier otra parte del cuerpo (dolor referido). TRATAMIENTO El tratamiento para la fibromialgia y el sndrome de dolor miofascial a menudo requiere la participacin de un equipo de mdicos. Generalmente, comienza con el mdico de cabecera y un fisioterapeuta. Es posible que tambin le resulte til trabajar con profesionales alternativos, como masoterapeutas o acupunturistas. El tratamiento para la fibromialgia puede incluir medicamentos, entre ellos, antiinflamatorios no esteroides (AINE) junto con otros frmacos. El tratamiento para el dolor miofascial tambin puede incluir lo siguiente:  Antiinflamatorios no esteroides.  Relajacin y elongacin de los msculos.  Inyecciones en los puntos neurlgicos.  Tratamientos con ondas de sonido (ultrasonido) para [employer]. INSTRUCCIONES PARA EL CUIDADO EN EL HOGAR  Tome los medicamentos solamente como se lo haya indicado el mdico.  Haga ejercicio como se lo haya indicado el fisioterapeuta o su mdico.  Tratar de evitar las situaciones estresantes.  Practique tcnicas de relajacin para mantener el estrs bajo control. Tal vez deba intentar lo siguiente:  Biorretroalimentacin.  Formacin de imgenes visuales.  Hipnosis.  Relajacin muscular.  Yoga.  Meditacin.  Hable con el Enterprise Products tratamientos alternativos, por ejemplo, acupuntura o [place].  Lleve un estilo de vida saludable. Esto incluye consumir una dieta saludable y dormir lo suficiente.  Considere la posibilidad de Advertising account planner en un grupo de apoyo.  No realice actividades que le generen tensin o sobrecarga muscular. Eso incluye hacer movimientos repetitivos y levantar objetos pesados. SOLICITE ATENCIN  MDICA SI:  Aparecen nuevos sntomas.  Los sntomas empeoran.  Los Toys ''R[employer] Us causan BB&T Corporation.  Tiene dificultad  para dormir.  La afeccin le causa depresin o ansiedad. Irven ShellingPARA OBTENER MS INFORMACIN  Asociacin Nacional de Fibromialgia (National Fibromyalgia Association): www.fmaware.org  Fundacin contra la Artritis (Arthritis Foundation): www.arthritis.org  Asociacin Estadounidense del IT sales professionalDolor Crnico (American Chronic Pain Association): GumSearch.nlwww.theacpa.org/condition/myofascial-pain Esta informacin no tiene Theme park managercomo fin reemplazar el consejo del mdico. Asegrese de hacerle al mdico cualquier pregunta que tenga. Document Released: 09/04/2005 Document Revised: 12/27/2015 Document Reviewed: 06/10/2014 Elsevier Interactive Patient Education  2017 ArvinMeritorElsevier Inc.

## 2016-09-07 NOTE — Progress Notes (Signed)
[name] [name], is a 55 y.o. female  ION:[id number]  [id number]  DOB - 09/30/1960  Chief Complaint  Patient presents with  . Pain       Subjective:   [name] [name] is a 55 y.o. 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 [facility], she was seen recently and was scheduled to have some lab tests done but she does not know what type of test. Patient is not a very good historian. She claims she is doing well, has no significant complaint today. She is here today with her mother a sister. Patient has No headache, No chest pain, No abdominal pain - No Nausea, No new weakness tingling or numbness. She needs refill on some of her medications.   No problems updated.  ALLERGIES: Allergies  Allergen Reactions  . Shellfish Allergy Anaphylaxis  . Gabapentin     rash    PAST MEDICAL HISTORY: Past Medical History:  Diagnosis Date  . Chronic pain   . COPD (chronic obstructive pulmonary disease) (HCC)   . Fibromyalgia   . Migraine   . Migraine without aura, with intractable migraine, so stated, without mention of status migrainosus 05/18/2014  . Pneumonia   . Pulmonary fibrosis (HCC)   . Seizures (HCC)     MEDICATIONS AT HOME: Prior to Admission medications   Medication Sig Start Date End Date Taking? Authorizing Provider  albuterol (PROVENTIL HFA;VENTOLIN HFA) 108 (90 BASE) MCG/ACT inhaler Inhale 2 puffs into the lungs every 6 (six) hours as needed for wheezing or shortness of breath. 06/28/15   Quentin Angstlugbemiga E Chari Parmenter, MD  atorvastatin (LIPITOR) 40 MG tablet Take 1 tablet (40 mg total) by mouth daily. 02/17/16   Quentin Angstlugbemiga E Lively Haberman, MD  azithromycin (ZITHROMAX) 250 MG tablet Take by mouth daily.    Historical Provider, MD  Cyanocobalamin (B-12) 2000 MCG TABS Take 2,000 mcg by mouth daily. 09/07/16   Quentin Angstlugbemiga E Fayetta Sorenson, MD  etodolac  (LODINE) 500 MG tablet Take 1 tablet (500 mg total) by mouth 2 (two) times daily. 04/28/14   Nyoka CowdenMichael B Wert, MD  fentaNYL (DURAGESIC - DOSED MCG/HR) 50 MCG/HR Place 1 patch (50 mcg total) onto the skin every 3 (three) days. 04/28/14   Nyoka CowdenMichael B Wert, MD  folic acid (FOLVITE) 1 MG tablet Take 1 mg by mouth daily.    Historical Provider, MD  furosemide (LASIX) 20 MG tablet Take 1 tablet (20 mg total) by mouth daily. 09/07/16 09/07/17  Quentin Angstlugbemiga E Maison Agrusa, MD  levETIRAcetam (KEPPRA) 500 MG tablet Take 1 tablet (500 mg total) by mouth 2 (two) times daily. 06/08/16   Quentin Angstlugbemiga E Loree Shehata, MD  omeprazole (PRILOSEC) 40 MG capsule Take by mouth. 05/25/16   Historical Provider, MD  OXYGEN Inhale 2 L/min into the lungs at bedtime.    Historical Provider, MD  pantoprazole (PROTONIX) 40 MG tablet Take 1 tablet (40 mg total) by mouth daily. 09/07/16   Quentin Angstlugbemiga E Fredonia Casalino, MD  polyethylene glycol (MIRALAX / GLYCOLAX) packet Take 17 g by mouth daily as needed for mild constipation or moderate constipation. 03/06/14   Ambrose FinlandValerie A Keck, NP  pregabalin (LYRICA) 300 MG capsule Take 300 mg by mouth 2 (two) times daily.    Historical Provider, MD  rizatriptan (MAXALT) 10 MG tablet Take 1 tablet (10 mg total) by mouth daily as needed for migraine. May repeat in 2 hours if needed 03/01/16  Butch PennyMegan Millikan, NP  topiramate (TOPAMAX) 25 MG tablet Take 5 tablets (125 mg total) by mouth at bedtime. 08/31/16   Butch PennyMegan Millikan, NP  traMADol (ULTRAM) 50 MG tablet Take 1-2 tablets (50-100 mg total) by mouth every 4 (four) hours as needed (cough). 04/28/14   Nyoka CowdenMichael B Wert, MD  Vitamin D, Ergocalciferol, (DRISDOL) 50000 units CAPS capsule Take 1 capsule (50,000 Units total) by mouth every 7 (seven) days. 09/07/16   Quentin Angstlugbemiga E Aliveah Gallant, MD    Objective:   Vitals:   09/07/16 1552  BP: 110/67  Pulse: 64  Resp: 18  Temp: 97.6 F (36.4 C)  TempSrc: Oral  SpO2: 100%  Weight: 124 lb 12.8 oz (56.6 kg)  Height: 5\' 1"  (1.549 m)    Exam General appearance : Awake, alert, not in any distress. Speech Clear. Not toxic but chronically ill-looking with temporal wasting HEENT: Atraumatic and Normocephalic, pupils equally reactive to light and accomodation Neck: Supple, no JVD. No cervical lymphadenopathy.  Chest: Good air entry bilaterally, no added sounds  CVS: S1 S2 regular, no murmurs.  Abdomen: Bowel sounds present, Non tender and not distended with no gaurding, rigidity or rebound. Extremities: B/L Lower Ext shows no edema, both legs are warm to touch Neurology: Awake alert, and oriented X 3, CN II-XII intact, Non focal Skin: No Rash  Data Review Lab Results  Component Value Date   HGBA1C 5.6 09/10/2014    Assessment & Plan   1. BOOP (bronchiolitis obliterans with organizing pneumonia) (HCC)  - [facility], discussed with patient - Start the prednisone and Bactrim as prescribed - Continue follow up with Pulmonologist at [facility] - furosemide (LASIX) 20 MG tablet; Take 1 tablet (20 mg total) by mouth daily.  Dispense: 90 tablet; Refill: 3  2. Dyslipidemia  To address this please limit saturated fat to no more than 7% of your calories, limit cholesterol to 200 mg/day, increase fiber and exercise as tolerated. If needed we may add another cholesterol lowering medication to your regimen.   3. Fibromyalgia  - pantoprazole (PROTONIX) 40 MG tablet; Take 1 tablet (40 mg total) by mouth daily.  Dispense: 180 tablet; Refill: 3  4. Abdominal pain, chronic, epigastric  Refill - pantoprazole (PROTONIX) 40 MG tablet; Take 1 tablet (40 mg total) by mouth daily.  Dispense: 180 tablet; Refill: 3 - Cyanocobalamin (B-12) 2000 MCG TABS; Take 2,000 mcg by mouth daily.  Dispense: 90 tablet; Refill: 3 - Vitamin D, Ergocalciferol, (DRISDOL) 50000 units CAPS capsule; Take 1 capsule (50,000 Units total) by mouth every 7 (seven) days.  Dispense: 12 capsule; Refill: 3  Patient have been counseled  extensively about nutrition and exercise as tolerated. Other issues discussed during this visit include: low cholesterol diet, importance of adherence with medications and regular follow-up.   Return in about 3 months (around 12/06/2016) for BOOP, Dyslipidemia.  The patient was given clear instructions to go to ER or return to medical center if symptoms don't improve, worsen or new problems develop. The patient verbalized understanding. The patient was told to call to get lab results if they haven't heard anything in the next week.   This note has been created with Education officer, environmentalDragon speech recognition software and smart phrase technology. Any transcriptional errors are unintentional.    Jeanann LewandowskyJEGEDE, Jaxon Mynhier, MD, MHA, FACP, FAAP, CPE [facility] [place], [place] [phone]   09/07/2016, 4:32 PM

## 2016-09-08 NOTE — Progress Notes (Signed)
Please inform patient of mammogram showing no evidence of cancer. The concerned areas represent normal tissue in both breast. Recommend to have another screening in one year.

## 2016-09-15 ENCOUNTER — Telehealth: Payer: Self-pay

## 2016-09-15 NOTE — Telephone Encounter (Signed)
Patient Verify date of birth.  Patient is aware that mammogram showing no evidence of cancer, and reccommended screening in one year.  I let the patient know that we received lab requests from [facility] and offer her to come for blood work, but she said she was going to call back to scheduled the appointment.

## 2016-09-15 NOTE — Telephone Encounter (Signed)
-----   Message from Margaretmary LombardNubia K [place], [place]MA sent at 09/08/2016 11:57 AM EST ----- Please inform patient of mammogram showing no evidence of cancer. The concerned areas represent normal tissue in both breast. Recommend to have another screening in one year.

## 2016-09-16 DIAGNOSIS — R0683 Snoring: Secondary | ICD-10-CM | POA: Diagnosis not present

## 2016-09-16 NOTE — Procedures (Signed)
   Patient Name: [name] [name], [name] Study Date: 09/04/2016 Gender: Female D.O.B: [birth year] Age (years): [age] Referring Provider: Blenda BridegroomKwadwo Dakwa Height (inches): 61 Interpreting Physician: Jetty Duhamellinton Young MD, ABSM Weight (lbs): 121 RPSGT: [name], [name] BMI: 23 MRN: [id number] Neck Size: 12.00 CLINICAL INFORMATION Sleep Study Type: NPSG  Indication for sleep study: Fatigue, OSA, Snoring  Epworth Sleepiness Score: 14  SLEEP STUDY TECHNIQUE As per the AASM Manual for the Scoring of Sleep and Associated Events v2.3 (April 2016) with a hypopnea requiring 4% desaturations.  The channels recorded and monitored were frontal, central and occipital EEG, electrooculogram (EOG), submentalis EMG (chin), nasal and oral airflow, thoracic and abdominal wall motion, anterior tibialis EMG, snore microphone, electrocardiogram, and pulse oximetry.  MEDICATIONS Medications self-administered by patient taken the night of the study : none reported  SLEEP ARCHITECTURE The study was initiated at 10:18:45 PM and ended at 4:43:57 AM.  Sleep onset time was 70.4 minutes and the sleep efficiency was 36.7%. The total sleep time was 141.3 minutes.  Stage REM latency was 249.5 minutes.  The patient spent 15.58% of the night in stage N1 sleep, 71.68% in stage N2 sleep, 0.00% in stage N3 and 12.74% in REM.  Alpha intrusion was absent.  Supine sleep was 12.04%.  RESPIRATORY PARAMETERS The overall apnea/hypopnea index (AHI) was 0.0 per hour. There were 0 total apneas, including 0 obstructive, 0 central and 0 mixed apneas. There were 0 hypopneas and 0 RERAs.  The AHI during Stage REM sleep was 0.0 per hour.  AHI while supine was 0.0 per hour.  The mean oxygen saturation was 96.23%. The minimum SpO2 during sleep was 93.00%. Patient wore O2 2l as at home.  Moderate snoring was noted during this study.  CARDIAC DATA The 2 lead EKG demonstrated sinus rhythm. The mean heart rate was 45.07 beats per minute. Other EKG  findings include: None.  LEG MOVEMENT DATA The total PLMS were 0 with a resulting PLMS index of 0.00. Associated arousal with leg movement index was 0.0 .  IMPRESSIONS - No significant obstructive sleep apnea occurred during this study (AHI = 0.0/h). - No significant central sleep apnea occurred during this study (CAI = 0.0/h). - No oxygen desaturation was noted during this study (Min O2 = 93.00%). Patient wore O2 2L as at home. Frequent artifact from dislodged oximeter. - The patient snored with Moderate snoring volume. - No cardiac abnormalities were noted during this study. - Clinically significant periodic limb movements did not occur during sleep. No significant associated arousals. - Patient had significant difficulty initiating and maintaining sleep- insomnia.  DIAGNOSIS - Nocturnal Hypoxemia (327.26 [G47.36 ICD-10]) - Insomnia - Primary Snoring (786.09 [R06.83 ICD-10])  RECOMMENDATIONS - Consider if difficulty initiating and maintaining sleep is typical problem for which treatment can be considered. - Sleep hygiene should be reviewed to assess factors that may improve sleep quality. - Weight management and regular exercise should be initiated or continued if appropriate.  [Electronically signed] 09/16/2016 10:47 AM  Jetty Duhamellinton Young MD, ABSM Diplomate, American Board of Sleep Medicine   NPI: [id number]  Waymon BudgeYOUNG,CLINTON D Diplomate, American Board of Sleep Medicine  ELECTRONICALLY SIGNED ON:  09/16/2016, 10:40 AM [facility] PH: [phone]   FX: [phone] ACCREDITED BY THE AMERICAN ACADEMY OF SLEEP MEDICINE

## 2016-09-21 ENCOUNTER — Telehealth: Payer: Self-pay | Admitting: Internal Medicine

## 2016-09-21 NOTE — Telephone Encounter (Signed)
Patient states that physician from [facility] called and left a voicemail to request labs but never received a call to schedule labs.( Dr. Andria Meuseraft at [phone]). [phone] is his fax number. His nurse's name is Dewayne Hatchnn.

## 2016-09-22 NOTE — Telephone Encounter (Signed)
Patient stated she wanted the labs completed at her PCP's office during her last visit on 09/15/16. Patient was made aware of orders being placed and stated she needed to call back when her sister was available to schedule the appointment. Patient has not returned a phone call to make a lab appointment.

## 2016-09-25 ENCOUNTER — Other Ambulatory Visit: Payer: Self-pay | Admitting: Internal Medicine

## 2016-09-25 DIAGNOSIS — J849 Interstitial pulmonary disease, unspecified: Secondary | ICD-10-CM

## 2016-09-26 NOTE — Telephone Encounter (Signed)
I spoke with patient today and she has scheduled the lab visit for tomorrow at 10:00am

## 2016-09-26 NOTE — Telephone Encounter (Signed)
Patient called stating that she has been waiting for a call from the nurse since last week. Patient was told that nurse called her on the 5th and patient denied speaking to the nurse. Patient is very upset stating that  The orders have been faxed twice already. Please f/u with patient for clarification.

## 2016-09-27 ENCOUNTER — Ambulatory Visit: Payer: Medicaid Other | Attending: Internal Medicine

## 2016-09-27 DIAGNOSIS — J849 Interstitial pulmonary disease, unspecified: Secondary | ICD-10-CM | POA: Diagnosis present

## 2016-09-27 LAB — COMPLETE METABOLIC PANEL WITH GFR
ALT: 5 U/L — AB (ref 6–29)
AST: 15 U/L (ref 10–35)
Albumin: 4.3 g/dL (ref 3.6–5.1)
Alkaline Phosphatase: 116 U/L (ref 33–130)
BUN: 15 mg/dL (ref 7–25)
CHLORIDE: 111 mmol/L — AB (ref 98–110)
CO2: 21 mmol/L (ref 20–31)
CREATININE: 1.27 mg/dL — AB (ref 0.50–1.05)
Calcium: 10 mg/dL (ref 8.6–10.4)
GFR, Est African American: 55 mL/min — ABNORMAL LOW (ref >=60)
GFR, Est Non African American: 48 mL/min — ABNORMAL LOW (ref >=60)
Glucose, Bld: 73 mg/dL (ref 65–99)
Potassium: 4.7 mmol/L (ref 3.5–5.3)
Sodium: 142 mmol/L (ref 135–146)
Total Bilirubin: 0.6 mg/dL (ref 0.2–1.2)
Total Protein: 7 g/dL (ref 6.1–8.1)

## 2016-09-27 LAB — CBC WITH DIFFERENTIAL/PLATELET
BASOS PCT: 0 %
Basophils Absolute: 0 cells/uL (ref 0–200)
EOS ABS: 0 {cells}/uL — AB (ref 15–500)
Eosinophils Relative: 0 %
HEMATOCRIT: 41.7 % (ref 35.0–45.0)
HEMOGLOBIN: 13.2 g/dL (ref 11.7–15.5)
LYMPHS ABS: 3116 {cells}/uL (ref 850–3900)
Lymphocytes Relative: 41 %
MCH: 27.8 pg (ref 27.0–33.0)
MCHC: 31.7 g/dL — ABNORMAL LOW (ref 32.0–36.0)
MCV: 88 fL (ref 80.0–100.0)
MONO ABS: 608 {cells}/uL (ref 200–950)
MPV: 12.6 fL — AB (ref 7.5–12.5)
Monocytes Relative: 8 %
NEUTROS PCT: 51 %
Neutro Abs: 3876 cells/uL (ref 1500–7800)
Platelets: 184 10*3/uL (ref 140–400)
RBC: 4.74 MIL/uL (ref 3.80–5.10)
RDW: 14.8 % (ref 11.0–15.0)
WBC: 7.6 10*3/uL (ref 3.8–10.8)

## 2016-09-27 NOTE — Progress Notes (Signed)
Patient here for lab visit only 

## 2016-09-28 LAB — ANTI-NUCLEAR AB-TITER (ANA TITER)

## 2016-09-28 LAB — ANA: ANA: POSITIVE — AB

## 2016-09-28 LAB — C-REACTIVE PROTEIN: CRP: <0.2 mg/L (ref <8.0)

## 2016-09-28 LAB — MPO/PR-3 (ANCA) ANTIBODIES: MYELOPEROXIDASE ABS: 2 — AB

## 2016-09-28 LAB — SEDIMENTATION RATE: Sed Rate: 1 mm/hr (ref 0–30)

## 2016-10-03 NOTE — Telephone Encounter (Signed)
Patient verified DOB Patient is aware of an autoimmune being present based on lab work. Patient is aware of lab results being faxed to [facility]. Patient also requested a copy to be mailed to her which the MA obliged. No further questions at this time.

## 2016-10-30 ENCOUNTER — Telehealth: Payer: Self-pay | Admitting: Internal Medicine

## 2016-10-30 NOTE — Telephone Encounter (Signed)
Pt called states missed a call from our office, no note in system

## 2016-11-20 ENCOUNTER — Other Ambulatory Visit: Payer: Self-pay | Admitting: Adult Health

## 2016-11-20 ENCOUNTER — Telehealth: Payer: Self-pay | Admitting: Internal Medicine

## 2016-11-20 DIAGNOSIS — R569 Unspecified convulsions: Secondary | ICD-10-CM

## 2016-11-20 NOTE — Telephone Encounter (Signed)
Do you approve this request?

## 2016-11-20 NOTE — Telephone Encounter (Signed)
PT requesting a refill of pantoprazole (PROTONIX) 40 MG tablet   Pt requesting instructions to be changed to 2-3 tablets by mouth instead of just one tablet. And is requesting the dispense quantity to be changed to 60 tablets due to insurance not allowing 180 to be filled

## 2016-11-21 ENCOUNTER — Other Ambulatory Visit: Payer: Self-pay | Admitting: Internal Medicine

## 2016-11-21 DIAGNOSIS — M797 Fibromyalgia: Secondary | ICD-10-CM

## 2016-11-21 DIAGNOSIS — R1013 Epigastric pain: Secondary | ICD-10-CM

## 2016-11-21 DIAGNOSIS — G8929 Other chronic pain: Secondary | ICD-10-CM

## 2016-11-21 MED ORDER — PANTOPRAZOLE SODIUM 40 MG PO TBEC: 40.0000 mg | DELAYED_RELEASE_TABLET | Freq: Every day | ORAL | 3 refills | Status: DC

## 2016-11-21 NOTE — Telephone Encounter (Signed)
Yes, Refilled

## 2016-11-21 NOTE — Telephone Encounter (Signed)
Patient is aware of refill being placed and dosage being change. Medical Assistant left message on patient's home and cell voicemail. Voicemail states to give a call back to [place] [facility] at [phone].

## 2016-11-30 ENCOUNTER — Telehealth: Payer: Self-pay | Admitting: Internal Medicine

## 2016-11-30 NOTE — Telephone Encounter (Signed)
Patient called the office to speak with nurse regarding her medication. Pt stated that [facility] and her prescription for pantoprazole (PROTONIX) 40 MG tablet hasn't been called in to either pharmacy. Pt also asking that [facility]. The amount is incorrect. Patient stated that she takes more than one pill a day. She takes 2-3 a day and that 45 pills should be prescribed a month. Please follow up. Pt is completely out of medication.   Thank you.

## 2016-12-11 ENCOUNTER — Other Ambulatory Visit: Payer: Self-pay | Admitting: Internal Medicine

## 2016-12-11 DIAGNOSIS — G8929 Other chronic pain: Secondary | ICD-10-CM

## 2016-12-11 DIAGNOSIS — R1013 Epigastric pain: Secondary | ICD-10-CM

## 2016-12-11 DIAGNOSIS — M797 Fibromyalgia: Secondary | ICD-10-CM

## 2016-12-11 MED ORDER — PANTOPRAZOLE SODIUM 40 MG PO TBEC: 40.0000 mg | DELAYED_RELEASE_TABLET | Freq: Two times a day (BID) | ORAL | 11 refills | Status: DC

## 2016-12-12 NOTE — Telephone Encounter (Signed)
Refilled, and I spoke to the patient about it.

## 2017-01-03 ENCOUNTER — Other Ambulatory Visit: Payer: Self-pay | Admitting: Adult Health

## 2017-01-03 DIAGNOSIS — R569 Unspecified convulsions: Secondary | ICD-10-CM

## 2017-03-05 ENCOUNTER — Encounter: Payer: Self-pay | Admitting: Neurology

## 2017-03-05 ENCOUNTER — Ambulatory Visit (INDEPENDENT_AMBULATORY_CARE_PROVIDER_SITE_OTHER): Payer: Medicaid Other | Admitting: Neurology

## 2017-03-05 VITALS — BP 95/75 | HR 76 | Ht 61.0 in | Wt 114.5 lb

## 2017-03-05 DIAGNOSIS — G43019 Migraine without aura, intractable, without status migrainosus: Secondary | ICD-10-CM | POA: Diagnosis not present

## 2017-03-05 DIAGNOSIS — R569 Unspecified convulsions: Secondary | ICD-10-CM

## 2017-03-05 MED ORDER — RIZATRIPTAN BENZOATE 10 MG PO TABS: 10.0000 mg | ORAL_TABLET | Freq: Every day | ORAL | 5 refills | Status: DC | PRN

## 2017-03-05 MED ORDER — VENLAFAXINE HCL ER 37.5 MG PO CP24: ORAL_CAPSULE | ORAL | 3 refills | Status: DC

## 2017-03-05 NOTE — Progress Notes (Signed)
Reason for visit: Headaches  [name] [name] is an 56 y.o. female  History of present illness:  [name] [name] is a 56 year old right-handed Hispanic female with a history of migraine headaches and a history of seizures. The patient has done well with her seizures, she is on Keppra for this and she has not had seizures in many years. The patient has had ongoing headaches, the headaches worsened after the death of her mother in February 2018. The patient is having about 3 headache days a week, the headaches when she gets them may be associated with nausea and vomiting and may last a day. The patient takes Maxalt for the headache with some benefit. She is on Topamax taking 125 mg daily, but she believes that at this dose that she has significant blurring of vision, she has difficulty reading. She has not been to see her eye doctor in greater than 14 months. The patient does also have low back pain, she is followed through the [facility]. She is on Lyrica for her back pain. She returns to this office for an evaluation.  Past Medical History:  Diagnosis Date  . Chronic pain   . COPD (chronic obstructive pulmonary disease) (HCC)   . Fibromyalgia   . Migraine   . Migraine without aura, with intractable migraine, so stated, without mention of status migrainosus 05/18/2014  . Pneumonia   . Pulmonary fibrosis (HCC)   . Seizures (HCC)     Past Surgical History:  Procedure Laterality Date  . ABDOMINAL HYSTERECTOMY    . LUNG BIOPSY Left 11/21/2013   Procedure: LUNG BIOPSY;  Surgeon: Delight OvensEdward B Gerhardt, MD;  [facility];  Service: Thoracic;  Laterality: Left;  Marland Kitchen. VIDEO ASSISTED THORACOSCOPY Left 11/21/2013   Procedure: VIDEO ASSISTED THORACOSCOPY;  Surgeon: Delight OvensEdward B Gerhardt, MD;  [facility];  Service: Thoracic;  Laterality: Left;  Marland Kitchen. VIDEO BRONCHOSCOPY N/A 11/21/2013   Procedure: VIDEO BRONCHOSCOPY;  Surgeon: Delight OvensEdward B Gerhardt, MD;  [facility];  Service: Thoracic;  Laterality: N/A;     Family History  Problem Relation Age of Onset  . Hypertension Mother   . Hepatitis C Mother   . Diabetes Father   . Hypertension Father   . Bipolar disorder Brother   . Multiple sclerosis Sister     Social history:  reports that she quit smoking about 12 years ago. Her smoking use included Cigarettes. She smoked 0.00 packs per day for 10.00 years. She has never used smokeless tobacco. She reports that she does not drink alcohol or use drugs.    Allergies  Allergen Reactions  . Shellfish Allergy Anaphylaxis  . Gabapentin     rash    Medications:  Prior to Admission medications   Medication Sig Start Date End Date Taking? Authorizing Provider  atorvastatin (LIPITOR) 40 MG tablet Take 1 tablet (40 mg total) by mouth daily. 02/17/16  Yes Jegede, Phylliss Blakeslugbemiga E, MD  Cyanocobalamin (B-12) 2000 MCG TABS Take 2,000 mcg by mouth daily. 09/07/16  Yes Quentin AngstJegede, Olugbemiga E, MD  etodolac (LODINE) 500 MG tablet Take 1 tablet (500 mg total) by mouth 2 (two) times daily. 04/28/14  Yes Nyoka CowdenWert, Michael B, MD  fentaNYL (DURAGESIC - DOSED MCG/HR) 50 MCG/HR Place 1 patch (50 mcg total) onto the skin every 3 (three) days. 04/28/14  Yes Nyoka CowdenWert, Michael B, MD  folic acid (FOLVITE) 1 MG tablet Take 1 mg by mouth daily.   Yes [provider]  furosemide (LASIX) 20 MG tablet Take  1 tablet (20 mg total) by mouth daily. 09/07/16 09/07/17 Yes Quentin Angst, MD  levETIRAcetam (KEPPRA) 500 MG tablet Take 1 tablet (500 mg total) by mouth 2 (two) times daily. 06/08/16  Yes Jegede, Phylliss Blakes, MD  OXYGEN Inhale 2 L/min into the lungs at bedtime.   Yes [provider]  pantoprazole (PROTONIX) 40 MG tablet Take 1 tablet (40 mg total) by mouth 2 (two) times daily. 12/11/16  Yes Quentin Angst, MD  polyethylene glycol (MIRALAX / GLYCOLAX) packet Take 17 g by mouth daily as needed for mild constipation or moderate constipation. 03/06/14  Yes Ambrose Finland, NP  pregabalin (LYRICA) 300 MG capsule  Take 300 mg by mouth 2 (two) times daily.   Yes [provider]  rizatriptan (MAXALT) 10 MG tablet Take 1 tablet (10 mg total) by mouth daily as needed for migraine. May repeat in 2 hours if needed 03/01/16  Yes Butch Penny, NP  SYMBICORT 80-4.5 MCG/ACT inhaler Inhale 2 puffs into the lungs 2 (two) times daily.  02/09/17  Yes [provider]  topiramate (TOPAMAX) 25 MG tablet Take 5 tablets (125 mg total) by mouth at bedtime. 08/31/16  Yes Butch Penny, NP  traMADol (ULTRAM) 50 MG tablet Take 1-2 tablets (50-100 mg total) by mouth every 4 (four) hours as needed (cough). 04/28/14  Yes Nyoka Cowden, MD  Vitamin D, Ergocalciferol, (DRISDOL) 50000 units CAPS capsule Take 1 capsule (50,000 Units total) by mouth every 7 (seven) days. 09/07/16  Yes Quentin Angst, MD    ROS:  Out of a complete 14 system review of symptoms, the patient complains only of the following symptoms, and all other reviewed systems are negative. Headache Low back pain   Blood pressure 95/75, pulse 76, height 5\' 1"  (1.549 m), weight 114 lb 8 oz (51.9 kg).  Physical Exam  General: The patient is alert and cooperative at the time of the examination.  Skin: 1+ edema at the ankles is seen bilaterally.   Neurologic Exam  Mental status: The patient is alert and oriented x 3 at the time of the examination. The patient has apparent normal recent and remote memory, with an apparently normal attention span and concentration ability.   Cranial nerves: Facial symmetry is present. Speech is normal, no aphasia or dysarthria is noted. Extraocular movements are full. Visual fields are full.  Motor: The patient has good strength in all 4 extremities.  Sensory examination: Soft touch sensation is symmetric on the face, arms, and legs.  Coordination: The patient has good finger-nose-finger and heel-to-shin bilaterally.  Gait and station: The patient has a wide-based, unsteady gait. The patient normally  walks with a walker. Tandem gait is very unsteady. Romberg is negative, but is unsteady. No drift is seen.  Reflexes: Deep tendon reflexes are symmetric.   Assessment/Plan:  1. Migraine headache  2. Chronic low back pain  3. History of seizures  4. Gait disorder  The patient continues to have frequent migraine headache. The Topamax is not well-tolerated at the current level, she will be tapered off by one tablet every 5 days until she stops the medication. We will start Effexor taking 37.5 mg daily for 2 weeks and then go to 2 daily. She will call for any dose adjustments. The patient will follow-up through this office in 4 months. A prescription was sent in for the Maxalt. The patient will remain on Keppra.  Marlan Palau MD 03/05/2017 2:50 PM  [facility] [address]  [place], [place] [address]  Phone [phone] Fax [phone]

## 2017-03-05 NOTE — Patient Instructions (Signed)
   With the Topamax 25 mg tablets, lower the dose by one tablet every 5 days until off the medication. We will start effexor 37.5 mg taking one daily for 2 weeks, then take 2 a day.

## 2017-03-22 ENCOUNTER — Telehealth: Payer: Self-pay | Admitting: Neurology

## 2017-03-22 NOTE — Telephone Encounter (Signed)
I spoke with Joe who is a Advice workerphysician assistant [facility] The patient is undergoing a cardiac stress test. As the patient was not able to increase the heart rate enough with exercises medication is being considered. I reviewed the chart. She is on Keppra for a seizure disorder and has not had any seizures for many years. Therefore, I feel it is safe for her to proceed with this

## 2017-04-05 ENCOUNTER — Telehealth: Payer: Self-pay | Admitting: Internal Medicine

## 2017-04-05 ENCOUNTER — Telehealth: Payer: Self-pay | Admitting: Neurology

## 2017-04-05 MED ORDER — TOPIRAMATE 25 MG PO TABS: ORAL_TABLET | ORAL | 5 refills | Status: DC

## 2017-04-05 NOTE — Telephone Encounter (Signed)
Pt calling to request advice before having a medical procedure. Needs to have a return call within 24 hours so she can schedule procedure after PCP Jegede's recommendation. Please f/u. Thank you.

## 2017-04-05 NOTE — Telephone Encounter (Signed)
Patient called office in reference to venlafaxine XR (EFFEXOR XR) 37.5 MG 24 hr capsule.  After reading the side effects one stating "coma" patient said this medication is not for her and has not started medication.

## 2017-04-05 NOTE — Telephone Encounter (Signed)
A prescription will be called in for the Topamax. The patient does not wish to take the Effexor.

## 2017-04-05 NOTE — Telephone Encounter (Signed)
The patient apparently does not wish to take Effexor, we will keep her on Topamax for now.

## 2017-04-05 NOTE — Telephone Encounter (Signed)
Patient called office requesting a refill for Topamax 25mg  5 times daily due to not starting Effexor 37.5mg .  Pharmacy-  [facility] [place]

## 2017-04-05 NOTE — Addendum Note (Signed)
Addended by: York SpanielWILLIS, CHARLES K on: 04/05/2017 05:24 PM   Modules accepted: Orders

## 2017-04-06 ENCOUNTER — Telehealth: Payer: Self-pay | Admitting: Neurology

## 2017-04-06 NOTE — Telephone Encounter (Signed)
pateint stated she requested a topamax refill many times last week, has not heard back....has not tried effexor after she had read it causes COMA !  I found dr Staci Acostawillises prescription from yesterday , advised her of this .

## 2017-04-06 NOTE — Telephone Encounter (Signed)
I have discussed the planned procedure with procedure (scheduled for April 27, 2017). All questions answered, patient will go ahead with surgery and will schedule follow up appointment after.

## 2017-05-23 ENCOUNTER — Encounter: Payer: Self-pay | Admitting: Internal Medicine

## 2017-05-23 ENCOUNTER — Ambulatory Visit: Payer: Medicaid Other | Attending: Internal Medicine | Admitting: Internal Medicine

## 2017-05-23 VITALS — BP 93/66 | HR 68 | Temp 98.6°F | Resp 16 | Wt 113.8 lb

## 2017-05-23 DIAGNOSIS — Z79899 Other long term (current) drug therapy: Secondary | ICD-10-CM | POA: Insufficient documentation

## 2017-05-23 DIAGNOSIS — G8929 Other chronic pain: Secondary | ICD-10-CM | POA: Insufficient documentation

## 2017-05-23 DIAGNOSIS — R1013 Epigastric pain: Secondary | ICD-10-CM | POA: Diagnosis not present

## 2017-05-23 DIAGNOSIS — E785 Hyperlipidemia, unspecified: Secondary | ICD-10-CM | POA: Insufficient documentation

## 2017-05-23 DIAGNOSIS — M797 Fibromyalgia: Secondary | ICD-10-CM | POA: Diagnosis not present

## 2017-05-23 DIAGNOSIS — R630 Anorexia: Secondary | ICD-10-CM | POA: Diagnosis not present

## 2017-05-23 DIAGNOSIS — J449 Chronic obstructive pulmonary disease, unspecified: Secondary | ICD-10-CM | POA: Diagnosis not present

## 2017-05-23 DIAGNOSIS — Z79891 Long term (current) use of opiate analgesic: Secondary | ICD-10-CM | POA: Insufficient documentation

## 2017-05-23 DIAGNOSIS — J8489 Other specified interstitial pulmonary diseases: Secondary | ICD-10-CM | POA: Diagnosis not present

## 2017-05-23 DIAGNOSIS — Z9981 Dependence on supplemental oxygen: Secondary | ICD-10-CM | POA: Diagnosis not present

## 2017-05-23 MED ORDER — PANTOPRAZOLE SODIUM 40 MG PO TBEC: 40.0000 mg | DELAYED_RELEASE_TABLET | Freq: Two times a day (BID) | ORAL | 11 refills | Status: DC

## 2017-05-23 MED ORDER — ATORVASTATIN CALCIUM 40 MG PO TABS: 40.0000 mg | ORAL_TABLET | Freq: Every day | ORAL | 3 refills | Status: DC

## 2017-05-23 MED ORDER — B-12 2000 MCG PO TABS: 2000.0000 ug | ORAL_TABLET | Freq: Every day | ORAL | 3 refills | Status: DC

## 2017-05-23 MED ORDER — VITAMIN D (ERGOCALCIFEROL) 1.25 MG (50000 UNIT) PO CAPS: 50000.0000 [IU] | ORAL_CAPSULE | ORAL | 3 refills | Status: DC

## 2017-05-23 MED ORDER — MEGESTROL ACETATE 400 MG/10ML PO SUSP: 400.0000 mg | Freq: Every day | ORAL | 1 refills | Status: DC

## 2017-05-23 MED ORDER — FUROSEMIDE 20 MG PO TABS: 20.0000 mg | ORAL_TABLET | Freq: Every day | ORAL | 3 refills | Status: DC

## 2017-05-23 NOTE — Patient Instructions (Signed)
Myofascial Pain Syndrome and Fibromyalgia Myofascial pain syndrome and fibromyalgia are both pain disorders. This pain may be felt mainly in your muscles.  Myofascial pain syndrome: ? Always has trigger points or tender points in the muscle that will cause pain when pressed. The pain may come and go. ? Usually affects your neck, upper back, and shoulder areas. The pain often radiates into your arms and hands.  Fibromyalgia: ? Has muscle pains and tenderness that come and go. ? Is often associated with fatigue and sleep disturbances. ? Has trigger points. ? Tends to be long-lasting (chronic), but is not life-threatening.  Fibromyalgia and myofascial pain are not the same. However, they often occur together. If you have both conditions, each can make the other worse. Both are common and can cause enough pain and fatigue to make day-to-day activities difficult. What are the causes? The exact causes of fibromyalgia and myofascial pain are not known. People with certain gene types may be more likely to develop fibromyalgia. Some factors can be triggers for both conditions, such as:  Spine disorders.  Arthritis.  Severe injury (trauma) and other physical stressors.  Being under a lot of stress.  A medical illness.  What are the signs or symptoms? Fibromyalgia The main symptom of fibromyalgia is widespread pain and tenderness in your muscles. This can vary over time. Pain is sometimes described as stabbing, shooting, or burning. You may have tingling or numbness, too. You may also have sleep problems and fatigue. You may wake up feeling tired and groggy (fibro fog). Other symptoms may include:  Bowel and bladder problems.  Headaches.  Visual problems.  Problems with odors and noises.  Depression or mood changes.  Painful menstrual periods (dysmenorrhea).  Dry skin or eyes.  Myofascial pain syndrome Symptoms of myofascial pain syndrome include:  Tight, ropy bands of  muscle.  Uncomfortable sensations in muscular areas, such as: ? Aching. ? Cramping. ? Burning. ? Numbness. ? Tingling. ? Muscle weakness.  Trouble moving certain muscles freely (range of motion).  How is this diagnosed? There are no specific tests to diagnose fibromyalgia or myofascial pain syndrome. Both can be hard to diagnose because their symptoms are common in many other conditions. Your health care provider may suspect one or both of these conditions based on your symptoms and medical history. Your health care provider will also do a physical exam. The key to diagnosing fibromyalgia is having pain, fatigue, and other symptoms for more than three months that cannot be explained by another condition. The key to diagnosing myofascial pain syndrome is finding trigger points in muscles that are tender and cause pain elsewhere in your body (referred pain). How is this treated? Treating fibromyalgia and myofascial pain often requires a team of health care providers. This usually starts with your primary provider and a physical therapist. You may also find it helpful to work with alternative health care providers, such as massage therapists or acupuncturists. Treatment for fibromyalgia may include medicines. This may include nonsteroidal anti-inflammatory drugs (NSAIDs), along with other medicines. Treatment for myofascial pain may also include:  NSAIDs.  Cooling and stretching of muscles.  Trigger point injections.  Sound wave (ultrasound) treatments to stimulate muscles.  Follow these instructions at home:  Take medicines only as directed by your health care provider.  Exercise as directed by your health care provider or physical therapist.  Try to avoid stressful situations.  Practice relaxation techniques to control your stress. You may want to try: ? Biofeedback. ? Visual   imagery. ? Hypnosis. ? Muscle relaxation. ? Yoga. ? Meditation.  Talk to your health care provider  about alternative treatments, such as acupuncture or massage treatment.  Maintain a healthy lifestyle. This includes eating a healthy diet and getting enough sleep.  Consider joining a support group.  Do not do activities that stress or strain your muscles. That includes repetitive motions and heavy lifting. Where to find more information:  National Fibromyalgia Association: www.fmaware.org  Arthritis Foundation: www.arthritis.org  American Chronic Pain Association: www.theacpa.org/condition/myofascial-pain Contact a health care provider if:  You have new symptoms.  Your symptoms get worse.  You have side effects from your medicines.  You have trouble sleeping.  Your condition is causing depression or anxiety. This information is not intended to replace advice given to you by your health care provider. Make sure you discuss any questions you have with your health care provider. Document Released: 09/04/2005 Document Revised: 02/10/2016 Document Reviewed: 06/10/2014 Elsevier Interactive Patient Education  2018 [employer].  

## 2017-05-23 NOTE — Progress Notes (Signed)
[name] [name], is a 56 y.o. female  HGD:[id number]  DQQ:[id number]  DOB - Oct 28, 1960  Chief Complaint  Patient presents with  . Follow-up       Subjective:   [name] [name] is a 56 y.o. 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 Patient states that her mother passed away in 2016-12-13. Says she still feels sad, but is taking life day by day. She also admits having a poor appetite and fatigue. She continues to follow-up with pulmonology, neurology, cardiolody and the pain management clinic. Today, she admits to generalized pain but states that she hasn't taken any pain medicine this morning. She'll occasionally have chest tightness sometimes occurring at rest, usually last about 30 seconds to 1 minute, sometimes radiates to her left side of, occasionally associated with shortness of breath. She is on home oxygen, she needs a new prescription still advise home care for home oxygen tank. She is on Keppra for seizure, no recent seizure activity. She would like to try some medication for her poor appetite and ongoing weight loss. No new weakness tingling or numbness.  Problem  Poor Appetite    ALLERGIES: Allergies  Allergen Reactions  . Shellfish Allergy Anaphylaxis  . Gabapentin     rash    PAST MEDICAL HISTORY: Past Medical History:  Diagnosis Date  . Chronic pain   . COPD (chronic obstructive pulmonary disease) ([place])   . Fibromyalgia   . Migraine   . Migraine without aura, with intractable migraine, so stated, without mention of status migrainosus 05/18/2014  . Pneumonia   . Pulmonary fibrosis ([place])   . Seizures ([place])     MEDICATIONS AT HOME: Prior to Admission medications   Medication Sig Start Date End Date Taking? Authorizing Provider  atorvastatin (LIPITOR) 40 MG tablet Take 1 tablet (40 mg total) by mouth daily. 05/23/17   Tresa Garter, MD  Cyanocobalamin (B-12) 2000 MCG TABS Take 2,000 mcg by mouth daily. 05/23/17   Tresa Garter, MD  etodolac (LODINE) 500 MG tablet Take 1 tablet (500 mg total) by mouth 2 (two) times daily. 04/28/14   Tanda Rockers, MD  fentaNYL (DURAGESIC - DOSED MCG/HR) 50 MCG/HR Place 1 patch (50 mcg total) onto the skin every 3 (three) days. 04/28/14   Tanda Rockers, MD  folic acid (FOLVITE) 1 MG tablet Take 1 mg by mouth daily.    [provider]  furosemide (LASIX) 20 MG tablet Take 1 tablet (20 mg total) by mouth daily. 05/23/17 05/23/18  Tresa Garter, MD  levETIRAcetam (KEPPRA) 500 MG tablet Take 1 tablet (500 mg total) by mouth 2 (two) times daily. 06/08/16   Tresa Garter, MD  megestrol (MEGACE) 400 MG/10ML suspension Take 10 mLs (400 mg total) by mouth daily. 05/23/17   Tresa Garter, MD  OXYGEN Inhale 2 L/min into the lungs at bedtime.    [provider]  pantoprazole (PROTONIX) 40 MG tablet Take 1 tablet (40 mg total) by mouth 2 (two) times daily. 05/23/17   Tresa Garter, MD  polyethylene glycol (MIRALAX / GLYCOLAX) packet Take 17 g by mouth daily as needed for mild constipation or moderate constipation. 03/06/14   Lance Bosch, NP  pregabalin (LYRICA) 300 MG capsule Take 300 mg by mouth 2 (two) times daily.    [provider]  rizatriptan (MAXALT) 10 MG tablet Take  1 tablet (10 mg total) by mouth daily as needed for migraine. May repeat in 2 hours if needed 03/05/17   Willis, Charles K, MD  SYMBICORT 80-4.5 MCG/ACT inhaler Inhale 2 puffs into the lungs 2 (two) times daily.  02/09/17   [provider]  topiramate (TOPAMAX) 25 MG tablet 5 tablets daily 04/05/17   Willis, Charles K, MD  traMADol (ULTRAM) 50 MG tablet Take 1-2 tablets (50-100 mg total) by mouth every 4 (four) hours as needed (cough). 04/28/14   Wert, Michael B, MD  Vitamin D, Ergocalciferol, (DRISDOL) 50000 units CAPS capsule Take 1 capsule (50,000 Units total) by  mouth every 7 (seven) days. 05/23/17   ,  E, MD    Objective:   Vitals:   05/23/17 1346  BP: 93/66  Pulse: 68  Resp: 16  Temp: 98.6 F (37 C)  TempSrc: Oral  SpO2: 96%  Weight: 113 lb 12.8 oz (51.6 kg)   Exam General appearance : Awake, alert, not in any distress. Speech Clear. Chronically ill-looking but Not toxic looking. Frail. Uses walker to ambulate.  HEENT: Atraumatic and Normocephalic, pupils equally reactive to light and accomodation.  Neck: Supple, no JVD. No cervical lymphadenopathy.  Chest: Good air entry bilaterally, no added sounds  CVS: S1 S2 regular, no murmurs.  Abdomen: Bowel sounds present, Non tender and not distended with no gaurding, rigidity or rebound. Extremities: B/L Lower Ext shows no edema, both legs are warm to touch Neurology: Awake alert, and oriented X 3, CN II-XII intact, Non focal Skin: No Rash  Data Review Lab Results  Component Value Date   HGBA1C 5.6 09/10/2014    Assessment & Plan   1. Dyslipidemia  - atorvastatin (LIPITOR) 40 MG tablet; Take 1 tablet (40 mg total) by mouth daily.  Dispense: 90 tablet; Refill: 3 - Lipid panel - Urinalysis, Complete  To address this please limit saturated fat to no more than 7% of your calories, limit cholesterol to 200 mg/day, increase fiber and exercise as tolerated. If needed we may add another cholesterol lowering medication to your regimen.   2. Abdominal pain, chronic, epigastric  - Cyanocobalamin (B-12) 2000 MCG TABS; Take 2,000 mcg by mouth daily.  Dispense: 90 tablet; Refill: 3 - pantoprazole (PROTONIX) 40 MG tablet; Take 1 tablet (40 mg total) by mouth 2 (two) times daily.  Dispense: 60 tablet; Refill: 11 - Vitamin D, Ergocalciferol, (DRISDOL) 50000 units CAPS capsule; Take 1 capsule (50,000 Units total) by mouth every 7 (seven) days.  Dispense: 12 capsule; Refill: 3  3. BOOP (bronchiolitis obliterans with organizing pneumonia) (HCC) - Follow-up with pulmonology.  -  Continue home oxygen use at the current flow rate - furosemide (LASIX) 20 MG tablet; Take 1 tablet (20 mg total) by mouth daily.  Dispense: 90 tablet; Refill: 3 - CBC with Differential/Platelet - CMP14+EGFR - VITAMIN D 25 Hydroxy (Vit-D Deficiency, Fractures)  4. Fibromyalgia  - Follow up with Rheumatologist - Continue Lyrica  5. Poor appetite - Trial of Megace - megestrol (MEGACE) 400 MG/10ML suspension; Take 10 mLs (400 mg total) by mouth daily.  Dispense: 480 mL; Refill: 1  Patient have been counseled extensively about nutrition and exercise. Other issues discussed during this visit include: low cholesterol diet, weight control and daily exercise  Return in about 3 months (around 08/22/2017) for BOOP.  The patient was given clear instructions to go to ER or return to medical center if symptoms don't improve, worsen or new problems develop. The patient verbalized understanding. The   patient was told to call to get lab results if they haven't heard anything in the next week.   This note has been created with Dragon speech recognition software and smart phrase technology. Any transcriptional errors are unintentional.    , , MD, MHA, FACP, FAAP, CPE [facility] [place], [place] [phone]   05/23/2017, 2:52 PM 

## 2017-05-24 ENCOUNTER — Telehealth: Payer: Self-pay

## 2017-05-24 LAB — CBC WITH DIFFERENTIAL/PLATELET
BASOS ABS: 0 10*3/uL (ref 0.0–0.2)
Basos: 0 %
EOS (ABSOLUTE): 0 10*3/uL (ref 0.0–0.4)
Eos: 0 %
Hematocrit: 41.8 % (ref 34.0–46.6)
Hemoglobin: 13.3 g/dL (ref 11.1–15.9)
IMMATURE GRANS (ABS): 0 10*3/uL (ref 0.0–0.1)
IMMATURE GRANULOCYTES: 0 %
LYMPHS: 40 %
Lymphocytes Absolute: 1.9 10*3/uL (ref 0.7–3.1)
MCH: 25.8 pg — ABNORMAL LOW (ref 26.6–33.0)
MCHC: 31.8 g/dL (ref 31.5–35.7)
MCV: 81 fL (ref 79–97)
MONOS ABS: 0.5 10*3/uL (ref 0.1–0.9)
Monocytes: 11 %
NEUTROS PCT: 49 %
Neutrophils Absolute: 2.3 10*3/uL (ref 1.4–7.0)
PLATELETS: 164 10*3/uL (ref 150–379)
RBC: 5.16 x10E6/uL (ref 3.77–5.28)
RDW: 14.5 % (ref 12.3–15.4)
WBC: 4.8 10*3/uL (ref 3.4–10.8)

## 2017-05-24 LAB — URINALYSIS, COMPLETE
Bilirubin, UA: NEGATIVE
GLUCOSE, UA: NEGATIVE
NITRITE UA: NEGATIVE
RBC, UA: NEGATIVE
Specific Gravity, UA: 1.018 (ref 1.005–1.030)
UUROB: 0.2 mg/dL (ref 0.2–1.0)
pH, UA: 5 (ref 5.0–7.5)

## 2017-05-24 LAB — LIPID PANEL
Chol/HDL Ratio: 4.2 ratio (ref 0.0–4.4)
Cholesterol, Total: 191 mg/dL (ref 100–199)
HDL: 46 mg/dL (ref >39)
LDL Calculated: 122 mg/dL — ABNORMAL HIGH (ref 0–99)
TRIGLYCERIDES: 113 mg/dL (ref 0–149)
VLDL CHOLESTEROL CAL: 23 mg/dL (ref 5–40)

## 2017-05-24 LAB — CMP14+EGFR
A/G RATIO: 1.8 (ref 1.2–2.2)
ALT: 8 IU/L (ref 0–32)
AST: 24 IU/L (ref 0–40)
Albumin: 4.4 g/dL (ref 3.5–5.5)
Alkaline Phosphatase: 117 IU/L (ref 39–117)
BILIRUBIN TOTAL: 0.3 mg/dL (ref 0.0–1.2)
BUN/Creatinine Ratio: 11 (ref 9–23)
BUN: 12 mg/dL (ref 6–24)
CALCIUM: 9.3 mg/dL (ref 8.7–10.2)
CHLORIDE: 105 mmol/L (ref 96–106)
CO2: 24 mmol/L (ref 20–29)
Creatinine, Ser: 1.12 mg/dL — ABNORMAL HIGH (ref 0.57–1.00)
GFR, EST AFRICAN AMERICAN: 64 mL/min/{1.73_m2} (ref >59)
GFR, EST NON AFRICAN AMERICAN: 55 mL/min/{1.73_m2} — AB (ref >59)
GLUCOSE: 79 mg/dL (ref 65–99)
Globulin, Total: 2.4 g/dL (ref 1.5–4.5)
POTASSIUM: 4 mmol/L (ref 3.5–5.2)
Sodium: 141 mmol/L (ref 134–144)
TOTAL PROTEIN: 6.8 g/dL (ref 6.0–8.5)

## 2017-05-24 LAB — VITAMIN D 25 HYDROXY (VIT D DEFICIENCY, FRACTURES): VIT D 25 HYDROXY: 24.9 ng/mL — AB (ref 30.0–100.0)

## 2017-05-24 LAB — MICROSCOPIC EXAMINATION: WBC, UA: >30 /hpf — AB (ref 0–?)

## 2017-05-24 NOTE — Telephone Encounter (Signed)
[facility] at the request of Dr Hyman HopesJegede. He noted that the patient stated [facility]. This CM spoke to OasisAngela who stated that she did not see any documentation that information was needed at this time.  Call placed to the patient and informed her that as [facility], no documentation is needed at this time. She stated that [facility] asking her to speak to her doctor about documentation for re-certifying her O2. Instructed her to have [facility] call this office if they call her and ask her to contact her doctor about information for her O2.

## 2017-05-25 ENCOUNTER — Other Ambulatory Visit: Payer: Self-pay | Admitting: Internal Medicine

## 2017-05-25 ENCOUNTER — Telehealth (INDEPENDENT_AMBULATORY_CARE_PROVIDER_SITE_OTHER): Payer: Self-pay | Admitting: *Deleted

## 2017-05-25 MED ORDER — SULFAMETHOXAZOLE-TRIMETHOPRIM 800-160 MG PO TABS: 1.0000 | ORAL_TABLET | Freq: Two times a day (BID) | ORAL | 0 refills | Status: AC

## 2017-05-25 NOTE — Telephone Encounter (Signed)
-----   Message from Quentin Angstlugbemiga E Jegede, MD sent at 05/25/2017 10:38 AM EDT ----- Please inform the patient: Vitamin D has improved, cholesterol is improving, kidney function is stable. Urine showed possible infection brewing, will send antibiotics to the pharmacy for pick-up.

## 2017-05-25 NOTE — Telephone Encounter (Signed)
Patient verified DOB Patient is aware of cholesterol and vitamin d level improving. Patient is aware of an antibiotic being sent to the [facility] for possible infection starting. Patient had no further questions at this time.

## 2017-06-05 ENCOUNTER — Other Ambulatory Visit: Payer: Self-pay | Admitting: Adult Health

## 2017-06-05 DIAGNOSIS — R569 Unspecified convulsions: Secondary | ICD-10-CM

## 2017-06-05 NOTE — Telephone Encounter (Signed)
Spoke with patient and informed her that NP refilled Keppra x 6 months. Advised she let this RN know if she would like this office to continue to refill or her PCP refill. She stated she would like Dr Anne Hahn to continue to refill her Keppra and migraine medications. This RN advised she would note that. Patient verbalized understanding, appreciation of call back.

## 2017-06-06 ENCOUNTER — Telehealth: Payer: Self-pay

## 2017-06-06 NOTE — Telephone Encounter (Signed)
Pt contacted the office and stated for the nurse to look at her record that she just came from [facility] the dr made some specifications that she needs blood work every 2 weeks and she would like a call to see when to start getting blood work. Pt stats she will be starting the antibiotics and prednisone tomorrow. Pt states when she gets blood drawn we will need to fax results to [facility]. Please f/u

## 2017-06-07 NOTE — Telephone Encounter (Signed)
Authorized.

## 2017-06-07 NOTE — Telephone Encounter (Signed)
Please authorize bi weekly blood draw visits for the patient per [facility].

## 2017-06-08 NOTE — Telephone Encounter (Signed)
Please schedule the patient for blood draw, ask her to bring the requested orders with her from [facility] so the lab tech knows what to draw.

## 2017-06-14 ENCOUNTER — Telehealth: Payer: Self-pay | Admitting: Internal Medicine

## 2017-06-14 NOTE — Telephone Encounter (Signed)
Patient called requesting status of lab orders for [facility] , pt states she does not have orders with her. Pt is upset because she has been waiting with no response. Pt would like a follow up call. Please f/up

## 2017-06-14 NOTE — Telephone Encounter (Signed)
Phone:[phone] Fax-[phone] Pt called to give information to obtain blood test from [facility].

## 2017-06-14 NOTE — Telephone Encounter (Signed)
Patient verified DOB Patient reports the [facility] provider told her the lab request will be in the notes for PCP to review. MA informed patient of no paperwork being received via fax. MA advised patient to bring the orders with her and patient reports not being given any paperwork. MA is going to contact the provider at [facility] and attempt to gain some clarity.

## 2017-06-15 NOTE — Telephone Encounter (Signed)
MA has been in contact with Joetta Manners on 06/14/17 and is awaiting a return phone call with clarity on the labs being requested by [facility].

## 2017-06-20 ENCOUNTER — Ambulatory Visit: Payer: Medicaid Other | Attending: Internal Medicine

## 2017-06-20 DIAGNOSIS — Z5181 Encounter for therapeutic drug level monitoring: Secondary | ICD-10-CM | POA: Diagnosis present

## 2017-06-20 NOTE — Progress Notes (Signed)
Patient here for lab visit  

## 2017-06-20 NOTE — Addendum Note (Signed)
Addended by: Margaretmary Lombard on: 06/20/2017 01:47 PM   Modules accepted: Orders

## 2017-06-21 LAB — CBC WITH DIFFERENTIAL/PLATELET
Basophils Absolute: 0 10*3/uL (ref 0.0–0.2)
Basos: 0 %
EOS (ABSOLUTE): 0 10*3/uL (ref 0.0–0.4)
Eos: 0 %
Hematocrit: 43 % (ref 34.0–46.6)
Hemoglobin: 13.4 g/dL (ref 11.1–15.9)
Immature Grans (Abs): 0 10*3/uL (ref 0.0–0.1)
Immature Granulocytes: 0 %
Lymphocytes Absolute: 1.2 10*3/uL (ref 0.7–3.1)
Lymphs: 15 %
MCH: 26.2 pg — ABNORMAL LOW (ref 26.6–33.0)
MCHC: 31.2 g/dL — ABNORMAL LOW (ref 31.5–35.7)
MCV: 84 fL (ref 79–97)
Monocytes Absolute: 0.3 10*3/uL (ref 0.1–0.9)
Monocytes: 4 %
Neutrophils Absolute: 6.3 10*3/uL (ref 1.4–7.0)
Neutrophils: 81 %
Platelets: 147 10*3/uL — ABNORMAL LOW (ref 150–379)
RBC: 5.12 x10E6/uL (ref 3.77–5.28)
RDW: 14.8 % (ref 12.3–15.4)
WBC: 7.9 10*3/uL (ref 3.4–10.8)

## 2017-06-21 LAB — COMPREHENSIVE METABOLIC PANEL
ALK PHOS: 73 IU/L (ref 39–117)
ALT: 8 IU/L (ref 0–32)
AST: 17 IU/L (ref 0–40)
Albumin/Globulin Ratio: 2.1 (ref 1.2–2.2)
Albumin: 4.4 g/dL (ref 3.5–5.5)
BILIRUBIN TOTAL: 0.5 mg/dL (ref 0.0–1.2)
BUN/Creatinine Ratio: 10 (ref 9–23)
BUN: 10 mg/dL (ref 6–24)
CHLORIDE: 108 mmol/L — AB (ref 96–106)
CO2: 20 mmol/L (ref 20–29)
CREATININE: 1.05 mg/dL — AB (ref 0.57–1.00)
Calcium: 8.9 mg/dL (ref 8.7–10.2)
GFR calc Af Amer: 69 mL/min/{1.73_m2} (ref >59)
GFR calc non Af Amer: 60 mL/min/{1.73_m2} (ref >59)
GLOBULIN, TOTAL: 2.1 g/dL (ref 1.5–4.5)
GLUCOSE: 85 mg/dL (ref 65–99)
Potassium: 3.6 mmol/L (ref 3.5–5.2)
SODIUM: 144 mmol/L (ref 134–144)
Total Protein: 6.5 g/dL (ref 6.0–8.5)

## 2017-06-25 NOTE — Telephone Encounter (Signed)
Patient verified DOB Patient was informed that her lab results are stable. Patient was informed to return in 2 weeks for lab work again.

## 2017-06-25 NOTE — Telephone Encounter (Signed)
-----   Message from Quentin Angst, MD sent at 06/25/2017 11:57 AM EDT ----- Lab results are stable.

## 2017-07-04 ENCOUNTER — Other Ambulatory Visit: Payer: Medicaid Other

## 2017-07-05 ENCOUNTER — Ambulatory Visit (INDEPENDENT_AMBULATORY_CARE_PROVIDER_SITE_OTHER): Payer: Medicaid Other | Admitting: Adult Health

## 2017-07-05 ENCOUNTER — Encounter: Payer: Self-pay | Admitting: Adult Health

## 2017-07-05 VITALS — BP 119/73 | HR 61 | Wt 109.0 lb

## 2017-07-05 DIAGNOSIS — R569 Unspecified convulsions: Secondary | ICD-10-CM | POA: Diagnosis not present

## 2017-07-05 DIAGNOSIS — G43009 Migraine without aura, not intractable, without status migrainosus: Secondary | ICD-10-CM

## 2017-07-05 MED ORDER — TOPIRAMATE 25 MG PO TABS: ORAL_TABLET | ORAL | 11 refills | Status: DC

## 2017-07-05 NOTE — Progress Notes (Signed)
PATIENT: [name] [name] DOB: [birth year]  REASON FOR VISIT: follow up- migraine headaches, seizures HISTORY FROM: patient  HISTORY OF PRESENT ILLNESS: Today 07/05/17 [name] [name] is a 56 year old female with a history of migraine headaches and seizures. [name] [name] returns today for follow-up. [name] [name] remains on Keppra 500 mg twice a day. [name] [name] denies any seizure events. [name] [name] is able to complete all ADLs independently.  At the last visit with Dr. Anne Hahn her headaches had worsened and [name] [name] was unable to tolerate Topamax. [name] [name] was instructed to wean off Topamax and start Effexor. However [name] [name] never started this medication due to potential side effects. [name] [name] remains on Topamax 125 mg daily. [name] [name] reports that her headaches are under good control at this time. [name] [name] reports that [name] [name] has 2 headaches a week. The headaches typically occur on the right side in the temporal region. [name] [name] does have photophobia, phonophobia as well as nausea. [name] [name] states that [name] [name] can take Maxalt and the headache resolves within 30 minutes. [name] [name] states that [name] [name]'s been seeing a rheumatologist and pulmonologist at [facility]. [name] [name] reports that [name] [name]'s been diagnosed with lupus and pulmonary fibrosis. [name] [name] returns today for follow-up.   HISTORY [name] [name] is a 56 year old right-handed Hispanic female with a history of migraine headaches and a history of seizures. The patient has done well with her seizures, [name] [name] is on Keppra for this and [name] [name] has not had seizures in many years. The patient has had ongoing headaches, the headaches worsened after the death of her mother in 2016-11-17. The patient is having about 3 headache days a week, the headaches when [name] [name] gets them may be associated with nausea and vomiting and may last a day. The patient takes Maxalt for the headache with some benefit. [name] [name] is on Topamax taking 125 mg daily, but [name] [name] believes that at this dose that [name] [name] has significant blurring of vision, [name] [name] has difficulty reading. [name] [name] has not been to see her eye doctor in  greater than 14 months. The patient does also have low back pain, [name] [name] is followed through the [facility]. [name] [name] is on Lyrica for her back pain. [name] [name] returns to this office for an evaluation  REVIEW OF SYSTEMS: Out of a complete 14 system review of symptoms, the patient complains only of the following symptoms, and all other reviewed systems are negative.  See HPI  ALLERGIES: Allergies  Allergen Reactions  . Shellfish Allergy Anaphylaxis  . Gabapentin     rash    HOME MEDICATIONS: Outpatient Medications Prior to Visit  Medication Sig Dispense Refill  . atorvastatin (LIPITOR) 40 MG tablet Take 1 tablet (40 mg total) by mouth daily. 90 tablet 3  . Cyanocobalamin (B-12) 2000 MCG TABS Take 2,000 mcg by mouth daily. 90 tablet 3  . etodolac (LODINE) 500 MG tablet Take 1 tablet (500 mg total) by mouth 2 (two) times daily. 30 tablet 0  . fentaNYL (DURAGESIC - DOSED MCG/HR) 50 MCG/HR Place 1 patch (50 mcg total) onto the skin every 3 (three) days. 5 patch 0  . folic acid (FOLVITE) 1 MG tablet Take 1 mg by mouth daily.    . furosemide (LASIX) 20 MG tablet Take 1 tablet (20 mg total) by mouth daily. 90 tablet 3  . levETIRAcetam (KEPPRA) 500 MG tablet Take 1 tablet (500 mg total) by mouth 2 (two) times daily. 180 tablet 5  . levETIRAcetam (KEPPRA) 500 MG tablet TAKE 1 TABLET(500 MG) BY MOUTH TWICE DAILY 60 tablet 5  . megestrol (MEGACE) 400 MG/10ML  suspension Take 10 mLs (400 mg total) by mouth daily. 480 mL 1  . OXYGEN Inhale 2 L/min into the lungs at bedtime.    . pantoprazole (PROTONIX) 40 MG tablet Take 1 tablet (40 mg total) by mouth 2 (two) times daily. 60 tablet 11  . polyethylene glycol (MIRALAX / GLYCOLAX) packet Take 17 g by mouth daily as needed for mild constipation or moderate constipation. 14 each 0  . pregabalin (LYRICA) 300 MG capsule Take 300 mg by mouth 2 (two) times daily.    . rizatriptan (MAXALT) 10 MG tablet Take 1 tablet (10 mg total) by mouth daily as needed for migraine.  May repeat in 2 hours if needed 10 tablet 5  . SYMBICORT 80-4.5 MCG/ACT inhaler Inhale 2 puffs into the lungs 2 (two) times daily.   1  . topiramate (TOPAMAX) 25 MG tablet 5 tablets daily 150 tablet 5  . traMADol (ULTRAM) 50 MG tablet Take 1-2 tablets (50-100 mg total) by mouth every 4 (four) hours as needed (cough). 84 tablet 0  . Vitamin D, Ergocalciferol, (DRISDOL) 50000 units CAPS capsule Take 1 capsule (50,000 Units total) by mouth every 7 (seven) days. 12 capsule 3   Facility-Administered Medications Prior to Visit  Medication Dose Route Frequency Provider Last Rate Last Dose  . 0.9 %  sodium chloride infusion  500 mL Intravenous Continuous Meryl DareStark, Malcolm T, MD        PAST MEDICAL HISTORY: Past Medical History:  Diagnosis Date  . Chronic pain   . COPD (chronic obstructive pulmonary disease) (HCC)   . Fibromyalgia   . Migraine   . Migraine without aura, with intractable migraine, so stated, without mention of status migrainosus 05/18/2014  . Pneumonia   . Pulmonary fibrosis (HCC)   . Seizures (HCC)     PAST SURGICAL HISTORY: Past Surgical History:  Procedure Laterality Date  . ABDOMINAL HYSTERECTOMY    . LUNG BIOPSY Left 11/21/2013   Procedure: LUNG BIOPSY;  Surgeon: Delight OvensEdward B Gerhardt, MD;  [facility];  Service: Thoracic;  Laterality: Left;  Marland Kitchen. VIDEO ASSISTED THORACOSCOPY Left 11/21/2013   Procedure: VIDEO ASSISTED THORACOSCOPY;  Surgeon: Delight OvensEdward B Gerhardt, MD;  [facility];  Service: Thoracic;  Laterality: Left;  Marland Kitchen. VIDEO BRONCHOSCOPY N/A 11/21/2013   Procedure: VIDEO BRONCHOSCOPY;  Surgeon: Delight OvensEdward B Gerhardt, MD;  [facility];  Service: Thoracic;  Laterality: N/A;    FAMILY HISTORY: Family History  Problem Relation Age of Onset  . Hypertension Mother   . Hepatitis C Mother   . Diabetes Father   . Hypertension Father   . Bipolar disorder Brother   . Multiple sclerosis Sister     SOCIAL HISTORY: Social History   Social History  . Marital status: Single     Spouse name: N/A  . Number of children: 0  . Years of education: college 3   Occupational History  . unemployed    Social History Main Topics  . Smoking status: Former Smoker    Packs/day: 0.00    Years: 10.00    Types: Cigarettes    Quit date: 09/18/2004  . Smokeless tobacco: Never Used  . Alcohol use No  . Drug use: No  . Sexual activity: No   Other Topics Concern  . Not on file   Social History Narrative  . No narrative on file      PHYSICAL EXAM  Vitals:   07/05/17 1338  BP: 119/73  Pulse: 61  Weight: 109 lb (49.4 kg)  Body mass index is 20.6 kg/m.  Generalized: Well developed, in no acute distress   Neurological examination  Mentation: Alert oriented to time, place, history taking. Follows all commands speech and language fluent Cranial nerve II-XII: Pupils were equal round reactive to light. Extraocular movements were full, visual field were full on confrontational test. Facial sensation and strength were normal. Uvula tongue midline. Head turning and shoulder shrug  were normal and symmetric. Motor: The motor testing reveals 5 over 5 strength of all 4 extremities. Good symmetric motor tone is noted throughout.  Sensory: Sensory testing is intact to soft touch on all 4 extremities. No evidence of extinction is noted.  Coordination: Cerebellar testing reveals good finger-nose-finger and heel-to-shin bilaterally.  Gait and station: [name] [name] uses a walker when ambulating.   DIAGNOSTIC DATA (LABS, IMAGING, TESTING) - I reviewed patient records, labs, notes, testing and imaging myself where available.  Lab Results  Component Value Date   WBC 7.9 06/20/2017   HGB 13.4 06/20/2017   HCT 43.0 06/20/2017   MCV 84 06/20/2017   PLT 147 (L) 06/20/2017      Component Value Date/Time   NA 144 06/20/2017 1345   K 3.6 06/20/2017 1345   CL 108 (H) 06/20/2017 1345   CO2 20 06/20/2017 1345   GLUCOSE 85 06/20/2017 1345   GLUCOSE 73 09/27/2016 1006   BUN 10 06/20/2017  1345   CREATININE 1.05 (H) 06/20/2017 1345   CREATININE 1.27 (H) 09/27/2016 1006   CALCIUM 8.9 06/20/2017 1345   PROT 6.5 06/20/2017 1345   ALBUMIN 4.4 06/20/2017 1345   AST 17 06/20/2017 1345   ALT 8 06/20/2017 1345   ALKPHOS 73 06/20/2017 1345   BILITOT 0.5 06/20/2017 1345   GFRNONAA 60 06/20/2017 1345   GFRNONAA 48 (L) 09/27/2016 1006   GFRAA 69 06/20/2017 1345   GFRAA 55 (L) 09/27/2016 1006   Lab Results  Component Value Date   CHOL 191 05/23/2017   HDL 46 05/23/2017   LDLCALC 122 (H) 05/23/2017   TRIG 113 05/23/2017   CHOLHDL 4.2 05/23/2017   Lab Results  Component Value Date   HGBA1C 5.6 09/10/2014   Lab Results  Component Value Date   VITAMINB12 231 10/07/2013   Lab Results  Component Value Date   TSH 0.972 09/10/2014      ASSESSMENT AND PLAN 56 y.o. year old female  has a past medical history of Chronic pain; COPD (chronic obstructive pulmonary disease) (HCC); Fibromyalgia; Migraine; Migraine without aura, with intractable migraine, so stated, without mention of status migrainosus (05/18/2014); Pneumonia; Pulmonary fibrosis (HCC); and Seizures (HCC). here with:  1. Seizures 2. Migraine headaches  Overall the patient has remained stable. [name] [name] will continue on Keppra 500 mg twice a day. [name] [name] will remain on Topamax 125 mg at bedtime. [name] [name] is advised that if [name] [name] has any seizure events [name] [name] should let us know. If her headache frequency or severity increases [name] [name] should also make [place] aware. [name] [name] will follow-up in 6 months or sooner if needed.     Butch Penny, MSN, NP-C 07/05/2017, 11:53 AM [facility] [address] [place], [place] [address] [phone]

## 2017-07-05 NOTE — Patient Instructions (Signed)
Your Plan:  Continue Keppra 500 mg twice a day Continue Topamax 125 mg at bedtime If your symptoms worsen or you develop new symptoms please let us know.  Thank you [facility]. I hope we have been able to provide you high quality care today.  You may receive a patient satisfaction survey over the next few weeks. We would appreciate your feedback and comments so that we may continue to improve ourselves and the health of our patients.

## 2017-07-05 NOTE — Progress Notes (Signed)
I have read the note, and I agree with the clinical assessment and plan.  WILLIS,CHARLES KEITH   

## 2017-07-13 ENCOUNTER — Ambulatory Visit: Payer: Medicaid Other | Attending: Internal Medicine

## 2017-07-13 DIAGNOSIS — I5032 Chronic diastolic (congestive) heart failure: Secondary | ICD-10-CM

## 2017-07-13 NOTE — Addendum Note (Signed)
Addended by: Margaretmary LombardLISBON, NUBIA K on: 07/13/2017 01:46 PM   Modules accepted: Orders

## 2017-07-13 NOTE — Progress Notes (Signed)
Patient here for lab visit only 

## 2017-07-14 LAB — CBC
Hematocrit: 40.2 % (ref 34.0–46.6)
Hemoglobin: 13.3 g/dL (ref 11.1–15.9)
MCH: 26.2 pg — AB (ref 26.6–33.0)
MCHC: 33.1 g/dL (ref 31.5–35.7)
MCV: 79 fL (ref 79–97)
PLATELETS: 172 10*3/uL (ref 150–379)
RBC: 5.07 x10E6/uL (ref 3.77–5.28)
RDW: 15.9 % — AB (ref 12.3–15.4)
WBC: 10.1 10*3/uL (ref 3.4–10.8)

## 2017-07-14 LAB — URINALYSIS, COMPLETE
Bilirubin, UA: NEGATIVE
GLUCOSE, UA: NEGATIVE
Ketones, UA: NEGATIVE
Nitrite, UA: NEGATIVE
SPEC GRAV UA: 1.025 (ref 1.005–1.030)
UUROB: 0.2 mg/dL (ref 0.2–1.0)
pH, UA: 5.5 (ref 5.0–7.5)

## 2017-07-14 LAB — COMPREHENSIVE METABOLIC PANEL
A/G RATIO: 1.9 (ref 1.2–2.2)
ALK PHOS: 58 IU/L (ref 39–117)
ALT: 6 IU/L (ref 0–32)
AST: 14 IU/L (ref 0–40)
Albumin: 4.2 g/dL (ref 3.5–5.5)
BUN / CREAT RATIO: 12 (ref 9–23)
BUN: 14 mg/dL (ref 6–24)
Bilirubin Total: 0.4 mg/dL (ref 0.0–1.2)
CO2: 20 mmol/L (ref 20–29)
CREATININE: 1.17 mg/dL — AB (ref 0.57–1.00)
Calcium: 9.5 mg/dL (ref 8.7–10.2)
Chloride: 106 mmol/L (ref 96–106)
GFR calc Af Amer: 61 mL/min/{1.73_m2} (ref >59)
GFR, EST NON AFRICAN AMERICAN: 53 mL/min/{1.73_m2} — AB (ref >59)
Globulin, Total: 2.2 g/dL (ref 1.5–4.5)
Glucose: 69 mg/dL (ref 65–99)
Potassium: 3.7 mmol/L (ref 3.5–5.2)
SODIUM: 142 mmol/L (ref 134–144)
Total Protein: 6.4 g/dL (ref 6.0–8.5)

## 2017-07-14 LAB — MICROSCOPIC EXAMINATION
Casts: NONE SEEN /lpf
Epithelial Cells (non renal): >10 /hpf — AB (ref 0–10)

## 2017-07-15 LAB — URINE CULTURE

## 2017-08-07 ENCOUNTER — Other Ambulatory Visit: Payer: Medicaid Other

## 2017-08-17 ENCOUNTER — Ambulatory Visit: Payer: Medicaid Other | Attending: Internal Medicine

## 2017-08-17 ENCOUNTER — Other Ambulatory Visit: Payer: Medicaid Other

## 2017-08-17 DIAGNOSIS — I5032 Chronic diastolic (congestive) heart failure: Secondary | ICD-10-CM | POA: Insufficient documentation

## 2017-08-17 NOTE — Progress Notes (Signed)
Patient here for lab visit only 

## 2017-08-18 LAB — COMPREHENSIVE METABOLIC PANEL
A/G RATIO: 1.9 (ref 1.2–2.2)
ALT: 8 IU/L (ref 0–32)
AST: 21 IU/L (ref 0–40)
Albumin: 4.6 g/dL (ref 3.5–5.5)
Alkaline Phosphatase: 72 IU/L (ref 39–117)
BUN/Creatinine Ratio: 9 (ref 9–23)
BUN: 13 mg/dL (ref 6–24)
Bilirubin Total: 0.7 mg/dL (ref 0.0–1.2)
CALCIUM: 10.1 mg/dL (ref 8.7–10.2)
CO2: 18 mmol/L — ABNORMAL LOW (ref 20–29)
Chloride: 104 mmol/L (ref 96–106)
Creatinine, Ser: 1.43 mg/dL — ABNORMAL HIGH (ref 0.57–1.00)
GFR, EST AFRICAN AMERICAN: 48 mL/min/{1.73_m2} — AB (ref >59)
GFR, EST NON AFRICAN AMERICAN: 41 mL/min/{1.73_m2} — AB (ref >59)
GLUCOSE: 153 mg/dL — AB (ref 65–99)
Globulin, Total: 2.4 g/dL (ref 1.5–4.5)
Potassium: 4.1 mmol/L (ref 3.5–5.2)
Sodium: 144 mmol/L (ref 134–144)
TOTAL PROTEIN: 7 g/dL (ref 6.0–8.5)

## 2017-08-18 LAB — CBC WITH DIFFERENTIAL/PLATELET
BASOS: 0 %
Basophils Absolute: 0 10*3/uL (ref 0.0–0.2)
EOS (ABSOLUTE): 0 10*3/uL (ref 0.0–0.4)
Eos: 0 %
Hematocrit: 45.9 % (ref 34.0–46.6)
Hemoglobin: 14.2 g/dL (ref 11.1–15.9)
IMMATURE GRANS (ABS): 0 10*3/uL (ref 0.0–0.1)
IMMATURE GRANULOCYTES: 0 %
LYMPHS: 42 %
Lymphocytes Absolute: 3.3 10*3/uL — ABNORMAL HIGH (ref 0.7–3.1)
MCH: 25.8 pg — ABNORMAL LOW (ref 26.6–33.0)
MCHC: 30.9 g/dL — ABNORMAL LOW (ref 31.5–35.7)
MCV: 84 fL (ref 79–97)
Monocytes Absolute: 0.7 10*3/uL (ref 0.1–0.9)
Monocytes: 9 %
NEUTROS PCT: 49 %
Neutrophils Absolute: 3.8 10*3/uL (ref 1.4–7.0)
PLATELETS: 192 10*3/uL (ref 150–379)
RBC: 5.5 x10E6/uL — ABNORMAL HIGH (ref 3.77–5.28)
RDW: 15.6 % — ABNORMAL HIGH (ref 12.3–15.4)
WBC: 7.9 10*3/uL (ref 3.4–10.8)

## 2017-08-28 ENCOUNTER — Other Ambulatory Visit: Payer: Self-pay | Admitting: Internal Medicine

## 2017-08-28 ENCOUNTER — Other Ambulatory Visit: Payer: Self-pay | Admitting: Obstetrics and Gynecology

## 2017-08-28 DIAGNOSIS — Z1231 Encounter for screening mammogram for malignant neoplasm of breast: Secondary | ICD-10-CM

## 2017-08-29 ENCOUNTER — Encounter: Payer: Self-pay | Admitting: Internal Medicine

## 2017-08-29 ENCOUNTER — Ambulatory Visit: Payer: Medicaid Other | Attending: Internal Medicine | Admitting: Internal Medicine

## 2017-08-29 ENCOUNTER — Telehealth: Payer: Self-pay | Admitting: Internal Medicine

## 2017-08-29 VITALS — BP 90/54 | HR 78 | Temp 98.9°F | Resp 16 | Ht 61.0 in | Wt 108.6 lb

## 2017-08-29 DIAGNOSIS — R1013 Epigastric pain: Secondary | ICD-10-CM | POA: Diagnosis not present

## 2017-08-29 DIAGNOSIS — E569 Vitamin deficiency, unspecified: Secondary | ICD-10-CM | POA: Diagnosis not present

## 2017-08-29 DIAGNOSIS — J8489 Other specified interstitial pulmonary diseases: Secondary | ICD-10-CM | POA: Diagnosis not present

## 2017-08-29 DIAGNOSIS — G8929 Other chronic pain: Secondary | ICD-10-CM | POA: Diagnosis not present

## 2017-08-29 DIAGNOSIS — E785 Hyperlipidemia, unspecified: Secondary | ICD-10-CM | POA: Diagnosis not present

## 2017-08-29 DIAGNOSIS — M797 Fibromyalgia: Secondary | ICD-10-CM | POA: Diagnosis not present

## 2017-08-29 DIAGNOSIS — Z79899 Other long term (current) drug therapy: Secondary | ICD-10-CM | POA: Diagnosis not present

## 2017-08-29 DIAGNOSIS — Z79891 Long term (current) use of opiate analgesic: Secondary | ICD-10-CM | POA: Diagnosis not present

## 2017-08-29 DIAGNOSIS — R630 Anorexia: Secondary | ICD-10-CM | POA: Diagnosis not present

## 2017-08-29 DIAGNOSIS — J449 Chronic obstructive pulmonary disease, unspecified: Secondary | ICD-10-CM | POA: Insufficient documentation

## 2017-08-29 MED ORDER — FOLIC ACID 1 MG PO TABS: 1.0000 mg | ORAL_TABLET | Freq: Every day | ORAL | 3 refills | Status: AC

## 2017-08-29 MED ORDER — VITAMIN D (ERGOCALCIFEROL) 1.25 MG (50000 UNIT) PO CAPS: 50000.0000 [IU] | ORAL_CAPSULE | ORAL | 3 refills | Status: DC

## 2017-08-29 MED ORDER — B-12 2000 MCG PO TABS
2000.0000 ug | ORAL_TABLET | Freq: Every day | ORAL | 3 refills | Status: AC
Start: 2017-08-29 — End: ?

## 2017-08-29 MED ORDER — MEGESTROL ACETATE 400 MG/10ML PO SUSP: 400.0000 mg | Freq: Every day | ORAL | 1 refills | Status: DC

## 2017-08-29 NOTE — Progress Notes (Signed)
Patient is here for a follow up.

## 2017-08-29 NOTE — Telephone Encounter (Signed)
Patient [place] and [place] with [place]. Patient was advised that she will get a phone call with a appointment. Patient also states she had blood in her urine and would like a dipstick performed on Friday during lab visit.

## 2017-08-29 NOTE — Progress Notes (Signed)
[name] [name], is a 56 y.o. female  KAJ:[id number]  MBT:[id number]  DOB - Sep 30, 1960  Chief Complaint  Patient presents with  . Follow-up      Subjective:   [name] [name] is a 56 y.o. 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 Bronchiolitis or RBILD. She is here today for routine follow up, some medication refills and referral to ophthalmologist. She had some lab works done recently and her serologies were notable for + p-ANCA, +MPO Ab, + ANA (1:320, anti-centromere pattern), and ESR 80. She was being treated with Cellcept 2015-2017 then restarted recently. She also has H/o migraines, seizures.  She underwent a LHC that showed very minimal plaque in her RCA, other vessels were normal. LVEDP not performed. Recently seen by pulmonary Dr Blenda Nicely who restarted Cellcept in Sept 566m bid with prednisone taper.   She has pain in her entire body, nerve pain, muscle pain, aches. Has joint pain arthritis in hands at [facility] with swelling at [facility], feels better now. Worse in the morning and in the evening terrible, very stiff in the morning. Has had arthritis for years.  She goes to pain management for fibromyalgia.She was seen by ortho who wants to operate but she didn't want surgery, wore brace for 3 months but didn't help. She was started on hydroxychloroquine, she needs ophthalmologist's evaluation for baseline eye check and subsequent follow up to monitor toxicity.   She denies any suicidal ideation or thoughts.   ALLERGIES: Allergies  Allergen Reactions  . Shellfish Allergy Anaphylaxis  . Gabapentin     rash    PAST MEDICAL HISTORY: Past Medical History:  Diagnosis Date  . Chronic pain   . COPD (chronic  obstructive pulmonary disease) ([place]   . Fibromyalgia   . Migraine   . Migraine without aura, with intractable migraine, so stated, without mention of status migrainosus 05/18/2014  . Pneumonia   . Pulmonary fibrosis ([place]   . Seizures ([place]     MEDICATIONS AT HOME: Prior to Admission medications   Medication Sig Start Date End Date Taking? Authorizing Provider  atorvastatin (LIPITOR) 40 MG tablet Take 1 tablet (40 mg total) by mouth daily. 05/23/17  Yes Yuto Cajuste, OMarlena Clipper MD  Cyanocobalamin (B-12) 2000 MCG TABS Take 2,000 mcg by mouth daily. 08/29/17  Yes JTresa Garter MD  etodolac (LODINE) 500 MG tablet Take 1 tablet (500 mg total) by mouth 2 (two) times daily. 04/28/14  Yes WTanda Rockers MD  fentaNYL (DURAGESIC - DOSED MCG/HR) 50 MCG/HR Place 1 patch (50 mcg total) onto the skin every 3 (three) days. 04/28/14  Yes WTanda Rockers MD  folic acid (FOLVITE) 1 MG tablet Take 1 tablet (1 mg total) by mouth daily. 08/29/17  Yes JTresa Garter MD  furosemide (LASIX) 20 MG tablet Take 1 tablet (20 mg total) by mouth daily. 05/23/17 05/23/18 Yes Andri Prestia E, MD  levETIRAcetam (KEPPRA) 500 MG tablet TAKE 1 TABLET(500 MG) BY MOUTH TWICE DAILY 06/05/17  Yes MWard Givens NP  megestrol (MEGACE) 400 MG/10ML suspension Take 10 mLs (400 mg total) by mouth daily. 08/29/17  Yes JTresa Garter MD  mycophenolate (CELLCEPT) 500 MG tablet 1,000 mg 2 (two) times daily. 06/26/17  Yes [provider]  Naloxone HCl (EVZIO) 0.4 MG/0.4ML SOAJ as  needed.  04/05/15  Yes [provider]  OXYGEN Inhale 2 L/min into the lungs at bedtime.   Yes [provider]  pantoprazole (PROTONIX) 40 MG tablet Take 1 tablet (40 mg total) by mouth 2 (two) times daily. 05/23/17  Yes Tresa Garter, MD  polyethylene glycol (MIRALAX / GLYCOLAX) packet Take 17 g by mouth daily as needed for mild constipation or moderate constipation. 03/06/14  Yes Lance Bosch, NP  predniSONE  (DELTASONE) 10 MG tablet  06/28/17  Yes [provider]  pregabalin (LYRICA) 300 MG capsule Take 300 mg by mouth 2 (two) times daily.   Yes [provider]  rizatriptan (MAXALT) 10 MG tablet Take 1 tablet (10 mg total) by mouth daily as needed for migraine. May repeat in 2 hours if needed 03/05/17  Yes Kathrynn Ducking, MD  sulfamethoxazole-trimethoprim (BACTRIM,SEPTRA) 400-80 MG tablet TK 1 T PO QD 06/28/17  Yes [provider]  SYMBICORT 80-4.5 MCG/ACT inhaler Inhale 2 puffs into the lungs 2 (two) times daily.  02/09/17  Yes [provider]  topiramate (TOPAMAX) 25 MG tablet 5 tablets daily 07/05/17  Yes [place], Jinny Blossom, NP  traMADol (ULTRAM) 50 MG tablet Take 1-2 tablets (50-100 mg total) by mouth every 4 (four) hours as needed (cough). 04/28/14  Yes Tanda Rockers, MD  Vitamin D, Ergocalciferol, (DRISDOL) 50000 units CAPS capsule Take 1 capsule (50,000 Units total) by mouth every 7 (seven) days. 08/29/17  Yes Tresa Garter, MD    Objective:   Vitals:   08/29/17 1223  BP: (!) 90/54  Pulse: 78  Resp: 16  Temp: 98.9 F (37.2 C)  TempSrc: Oral  SpO2: 97%  Weight: 108 lb 9.6 oz (49.3 kg)  Height: '5\' 1"'  (1.549 m)   Exam General appearance : Awake, alert, not in any distress. Speech Clear. Not toxic looking HEENT: Atraumatic and Normocephalic, pupils equally reactive to light and accomodation Neck: Supple, no JVD. No cervical lymphadenopathy.  Chest: Good air entry bilaterally, no added sounds  CVS: S1 S2 regular, no murmurs.  Abdomen: Bowel sounds present, Non tender and not distended with no gaurding, rigidity or rebound. Extremities: B/L Lower Ext shows no edema, both legs are warm to touch Neurology: Awake alert, and oriented X 3, CN II-XII intact, Non focal Skin: No Rash  Data Review Lab Results  Component Value Date   HGBA1C 5.6 09/10/2014    Assessment & Plan   1. BOOP (bronchiolitis obliterans with organizing pneumonia)  ([place])  - Ambulatory referral to Ophthalmology for evaluation, patient is now on Hydroxychloroquine for Inflammatory Arthritis, autoimmune  2. Dyslipidemia  To address this please limit saturated fat to no more than 7% of your calories, limit cholesterol to 200 mg/day, increase fiber and exercise as tolerated. If needed we may add another cholesterol lowering medication to your regimen.   3. Fibromyalgia  - Continue follow up with pain specialist and Rheumatologist  4. Poor appetite  - megestrol (MEGACE) 400 MG/10ML suspension; Take 10 mLs (400 mg total) by mouth daily.  Dispense: 480 mL; Refill: 1  5. Hypovitaminosis D  - Vitamin D, Ergocalciferol, (DRISDOL) 50000 units CAPS capsule; Take 1 capsule (50,000 Units total) by mouth every 7 (seven) days.  Dispense: 12 capsule; Refill: 3  Patient have been counseled extensively about nutrition and exercise. Other issues discussed during this visit include: low cholesterol diet, importance of adherence with medications and regular follow-up. We also discussed long term complications of uncontrolled hypertension.   Return in  about 3 months (around 11/27/2017) for Follow up Pain and comorbidities.  The patient was given clear instructions to go to ER or return to medical center if symptoms don't improve, worsen or new problems develop. The patient verbalized understanding. The patient was told to call to get lab results if they haven't heard anything in the next week.   This note has been created with Surveyor, quantity. Any transcriptional errors are unintentional.    Angelica Chessman, MD, [place], Karilyn Cota, [place] and [place], [place]   08/29/2017, 1:05 PM

## 2017-08-29 NOTE — Patient Instructions (Signed)
Myofascial Pain Syndrome and Fibromyalgia Myofascial pain syndrome and fibromyalgia are both pain disorders. This pain may be felt mainly in your muscles.  Myofascial pain syndrome: ? Always has trigger points or tender points in the muscle that will cause pain when pressed. The pain may come and go. ? Usually affects your neck, upper back, and shoulder areas. The pain often radiates into your arms and hands.  Fibromyalgia: ? Has muscle pains and tenderness that come and go. ? Is often associated with fatigue and sleep disturbances. ? Has trigger points. ? Tends to be long-lasting (chronic), but is not life-threatening.  Fibromyalgia and myofascial pain are not the same. However, they often occur together. If you have both conditions, each can make the other worse. Both are common and can cause enough pain and fatigue to make day-to-day activities difficult. What are the causes? The exact causes of fibromyalgia and myofascial pain are not known. People with certain gene types may be more likely to develop fibromyalgia. Some factors can be triggers for both conditions, such as:  Spine disorders.  Arthritis.  Severe injury (trauma) and other physical stressors.  Being under a lot of stress.  A medical illness.  What are the signs or symptoms? Fibromyalgia The main symptom of fibromyalgia is widespread pain and tenderness in your muscles. This can vary over time. Pain is sometimes described as stabbing, shooting, or burning. You may have tingling or numbness, too. You may also have sleep problems and fatigue. You may wake up feeling tired and groggy (fibro fog). Other symptoms may include:  Bowel and bladder problems.  Headaches.  Visual problems.  Problems with odors and noises.  Depression or mood changes.  Painful menstrual periods (dysmenorrhea).  Dry skin or eyes.  Myofascial pain syndrome Symptoms of myofascial pain syndrome include:  Tight, ropy bands of  muscle.  Uncomfortable sensations in muscular areas, such as: ? Aching. ? Cramping. ? Burning. ? Numbness. ? Tingling. ? Muscle weakness.  Trouble moving certain muscles freely (range of motion).  How is this diagnosed? There are no specific tests to diagnose fibromyalgia or myofascial pain syndrome. Both can be hard to diagnose because their symptoms are common in many other conditions. Your health care provider may suspect one or both of these conditions based on your symptoms and medical history. Your health care provider will also do a physical exam. The key to diagnosing fibromyalgia is having pain, fatigue, and other symptoms for more than three months that cannot be explained by another condition. The key to diagnosing myofascial pain syndrome is finding trigger points in muscles that are tender and cause pain elsewhere in your body (referred pain). How is this treated? Treating fibromyalgia and myofascial pain often requires a team of health care providers. This usually starts with your primary provider and a physical therapist. You may also find it helpful to work with alternative health care providers, such as massage therapists or acupuncturists. Treatment for fibromyalgia may include medicines. This may include nonsteroidal anti-inflammatory drugs (NSAIDs), along with other medicines. Treatment for myofascial pain may also include:  NSAIDs.  Cooling and stretching of muscles.  Trigger point injections.  Sound wave (ultrasound) treatments to stimulate muscles.  Follow these instructions at home:  Take medicines only as directed by your health care provider.  Exercise as directed by your health care provider or physical therapist.  Try to avoid stressful situations.  Practice relaxation techniques to control your stress. You may want to try: ? Biofeedback. ? Visual   imagery. ? Hypnosis. ? Muscle relaxation. ? Yoga. ? Meditation.  Talk to your health care provider  about alternative treatments, such as acupuncture or massage treatment.  Maintain a healthy lifestyle. This includes eating a healthy diet and getting enough sleep.  Consider joining a support group.  Do not do activities that stress or strain your muscles. That includes repetitive motions and heavy lifting. Where to find more information:  National Fibromyalgia Association: www.fmaware.org  Arthritis Foundation: www.arthritis.org  American Chronic Pain Association: www.theacpa.org/condition/myofascial-pain Contact a health care provider if:  You have new symptoms.  Your symptoms get worse.  You have side effects from your medicines.  You have trouble sleeping.  Your condition is causing depression or anxiety. This information is not intended to replace advice given to you by your health care provider. Make sure you discuss any questions you have with your health care provider. Document Released: 09/04/2005 Document Revised: 02/10/2016 Document Reviewed: 06/10/2014 Elsevier Interactive Patient Education  2018 [employer].  

## 2017-08-31 ENCOUNTER — Ambulatory Visit: Payer: Medicaid Other | Attending: Internal Medicine

## 2017-08-31 DIAGNOSIS — I5032 Chronic diastolic (congestive) heart failure: Secondary | ICD-10-CM | POA: Diagnosis not present

## 2017-08-31 NOTE — Progress Notes (Signed)
Patient here for lab visit only 

## 2017-09-01 LAB — COMPREHENSIVE METABOLIC PANEL
ALK PHOS: 56 IU/L (ref 39–117)
ALT: 8 IU/L (ref 0–32)
AST: 13 IU/L (ref 0–40)
Albumin/Globulin Ratio: 2.5 — ABNORMAL HIGH (ref 1.2–2.2)
Albumin: 4.2 g/dL (ref 3.5–5.5)
BILIRUBIN TOTAL: 0.5 mg/dL (ref 0.0–1.2)
BUN/Creatinine Ratio: 8 — ABNORMAL LOW (ref 9–23)
BUN: 9 mg/dL (ref 6–24)
CO2: 23 mmol/L (ref 20–29)
Calcium: 9.2 mg/dL (ref 8.7–10.2)
Chloride: 105 mmol/L (ref 96–106)
Creatinine, Ser: 1.14 mg/dL — ABNORMAL HIGH (ref 0.57–1.00)
GFR calc Af Amer: 63 mL/min/{1.73_m2} (ref >59)
GFR calc non Af Amer: 54 mL/min/{1.73_m2} — ABNORMAL LOW (ref >59)
GLUCOSE: 81 mg/dL (ref 65–99)
Globulin, Total: 1.7 g/dL (ref 1.5–4.5)
POTASSIUM: 3.8 mmol/L (ref 3.5–5.2)
Sodium: 142 mmol/L (ref 134–144)
Total Protein: 5.9 g/dL — ABNORMAL LOW (ref 6.0–8.5)

## 2017-09-01 LAB — CBC WITH DIFFERENTIAL/PLATELET
BASOS: 0 %
Basophils Absolute: 0 10*3/uL (ref 0.0–0.2)
EOS (ABSOLUTE): 0 10*3/uL (ref 0.0–0.4)
EOS: 0 %
Hematocrit: 42.6 % (ref 34.0–46.6)
Hemoglobin: 13.7 g/dL (ref 11.1–15.9)
IMMATURE GRANULOCYTES: 0 %
Immature Grans (Abs): 0 10*3/uL (ref 0.0–0.1)
Lymphocytes Absolute: 1.4 10*3/uL (ref 0.7–3.1)
Lymphs: 17 %
MCH: 25.8 pg — AB (ref 26.6–33.0)
MCHC: 32.2 g/dL (ref 31.5–35.7)
MCV: 80 fL (ref 79–97)
MONOCYTES: 6 %
Monocytes Absolute: 0.5 10*3/uL (ref 0.1–0.9)
NEUTROS PCT: 77 %
Neutrophils Absolute: 6.1 10*3/uL (ref 1.4–7.0)
PLATELETS: 181 10*3/uL (ref 150–379)
RBC: 5.31 x10E6/uL — AB (ref 3.77–5.28)
RDW: 15.2 % (ref 12.3–15.4)
WBC: 8 10*3/uL (ref 3.4–10.8)

## 2017-09-04 ENCOUNTER — Telehealth: Payer: Self-pay | Admitting: *Deleted

## 2017-09-04 NOTE — Telephone Encounter (Signed)
Patient verified DOB Patient is aware of labs being stable.  Patient is also aware of waiting for eye referral to be scheduled.

## 2017-09-04 NOTE — Telephone Encounter (Signed)
-----   Message from Quentin Angstlugbemiga E Jegede, MD sent at 09/04/2017  1:39 PM EST ----- Please inform patient that her lab results are stable

## 2017-09-13 ENCOUNTER — Ambulatory Visit: Payer: Medicaid Other | Attending: Internal Medicine

## 2017-09-13 DIAGNOSIS — I5032 Chronic diastolic (congestive) heart failure: Secondary | ICD-10-CM | POA: Diagnosis present

## 2017-09-13 NOTE — Progress Notes (Signed)
Patient here for lab visit only 

## 2017-09-14 ENCOUNTER — Other Ambulatory Visit: Payer: Medicaid Other

## 2017-09-14 LAB — COMPREHENSIVE METABOLIC PANEL
A/G RATIO: 2.4 — AB (ref 1.2–2.2)
ALK PHOS: 55 IU/L (ref 39–117)
ALT: 8 IU/L (ref 0–32)
AST: 10 IU/L (ref 0–40)
Albumin: 4 g/dL (ref 3.5–5.5)
BILIRUBIN TOTAL: 0.4 mg/dL (ref 0.0–1.2)
BUN/Creatinine Ratio: 10 (ref 9–23)
BUN: 10 mg/dL (ref 6–24)
CALCIUM: 9.4 mg/dL (ref 8.7–10.2)
CHLORIDE: 109 mmol/L — AB (ref 96–106)
CO2: 22 mmol/L (ref 20–29)
Creatinine, Ser: 1.05 mg/dL — ABNORMAL HIGH (ref 0.57–1.00)
GFR calc Af Amer: 69 mL/min/{1.73_m2} (ref >59)
GFR calc non Af Amer: 60 mL/min/{1.73_m2} (ref >59)
Globulin, Total: 1.7 g/dL (ref 1.5–4.5)
Glucose: 79 mg/dL (ref 65–99)
POTASSIUM: 4 mmol/L (ref 3.5–5.2)
Sodium: 144 mmol/L (ref 134–144)
Total Protein: 5.7 g/dL — ABNORMAL LOW (ref 6.0–8.5)

## 2017-09-14 LAB — CBC WITH DIFFERENTIAL/PLATELET
BASOS: 0 %
Basophils Absolute: 0 10*3/uL (ref 0.0–0.2)
EOS (ABSOLUTE): 0 10*3/uL (ref 0.0–0.4)
Eos: 0 %
Hematocrit: 40.6 % (ref 34.0–46.6)
Hemoglobin: 12.6 g/dL (ref 11.1–15.9)
IMMATURE GRANULOCYTES: 0 %
Immature Grans (Abs): 0 10*3/uL (ref 0.0–0.1)
LYMPHS ABS: 1.6 10*3/uL (ref 0.7–3.1)
Lymphs: 19 %
MCH: 25.8 pg — AB (ref 26.6–33.0)
MCHC: 31 g/dL — AB (ref 31.5–35.7)
MCV: 83 fL (ref 79–97)
MONOS ABS: 0.5 10*3/uL (ref 0.1–0.9)
Monocytes: 5 %
NEUTROS PCT: 76 %
Neutrophils Absolute: 6.6 10*3/uL (ref 1.4–7.0)
PLATELETS: 192 10*3/uL (ref 150–379)
RBC: 4.88 x10E6/uL (ref 3.77–5.28)
RDW: 15.4 % (ref 12.3–15.4)
WBC: 8.8 10*3/uL (ref 3.4–10.8)

## 2017-09-20 ENCOUNTER — Telehealth: Payer: Self-pay | Admitting: Internal Medicine

## 2017-09-20 NOTE — Telephone Encounter (Signed)
MA unable to reach [place] with [phone]. Customer service rep transferred me to "Angelica ChessmanMandy" at [phone]. MA still unable to reach rep. !!!Please inform of small amount of blood still being noted. A recheck will be completed at the next blood draw!!!

## 2017-09-20 NOTE — Telephone Encounter (Signed)
[facility] nurse call to get the urine result for this pt, but is was not done, please call her back at [phone] up#3 and up#2 please follow up

## 2017-09-26 ENCOUNTER — Ambulatory Visit
Admission: RE | Admit: 2017-09-26 | Discharge: 2017-09-26 | Disposition: A | Discharge status: Home / Self Care | Payer: Medicaid Other | Source: Ambulatory Visit | Attending: Internal Medicine | Admitting: Internal Medicine

## 2017-09-26 DIAGNOSIS — Z1231 Encounter for screening mammogram for malignant neoplasm of breast: Secondary | ICD-10-CM

## 2017-09-27 ENCOUNTER — Telehealth: Payer: Self-pay | Admitting: *Deleted

## 2017-09-27 ENCOUNTER — Ambulatory Visit: Payer: Medicaid Other | Attending: Internal Medicine

## 2017-09-27 DIAGNOSIS — I5032 Chronic diastolic (congestive) heart failure: Secondary | ICD-10-CM

## 2017-09-27 NOTE — Progress Notes (Signed)
Patient here for lab visit only 

## 2017-09-27 NOTE — Telephone Encounter (Signed)
Patient is aware of mammogram showing no evidence of malignancy and recommended screening be completed in one year.

## 2017-09-27 NOTE — Telephone Encounter (Signed)
-----   Message from Quentin Angstlugbemiga E Jegede, MD sent at 09/26/2017  4:58 PM EST ----- Please inform patient that her screening mammogram shows no evidence of malignancy. Recommend screening mammogram in one year

## 2017-09-28 LAB — CBC WITH DIFFERENTIAL/PLATELET
BASOS ABS: 0 10*3/uL (ref 0.0–0.2)
Basos: 0 %
EOS (ABSOLUTE): 0 10*3/uL (ref 0.0–0.4)
Eos: 0 %
HEMOGLOBIN: 11.8 g/dL (ref 11.1–15.9)
Hematocrit: 38.5 % (ref 34.0–46.6)
IMMATURE GRANS (ABS): 0 10*3/uL (ref 0.0–0.1)
Immature Granulocytes: 0 %
LYMPHS ABS: 1.1 10*3/uL (ref 0.7–3.1)
LYMPHS: 17 %
MCH: 25.2 pg — ABNORMAL LOW (ref 26.6–33.0)
MCHC: 30.6 g/dL — AB (ref 31.5–35.7)
MCV: 82 fL (ref 79–97)
MONOCYTES: 7 %
Monocytes Absolute: 0.4 10*3/uL (ref 0.1–0.9)
NEUTROS ABS: 4.8 10*3/uL (ref 1.4–7.0)
Neutrophils: 76 %
Platelets: 174 10*3/uL (ref 150–379)
RBC: 4.69 x10E6/uL (ref 3.77–5.28)
RDW: 15.5 % — ABNORMAL HIGH (ref 12.3–15.4)
WBC: 6.4 10*3/uL (ref 3.4–10.8)

## 2017-09-28 LAB — COMPREHENSIVE METABOLIC PANEL
ALBUMIN: 4 g/dL (ref 3.5–5.5)
ALK PHOS: 56 IU/L (ref 39–117)
ALT: 11 IU/L (ref 0–32)
AST: 15 IU/L (ref 0–40)
Albumin/Globulin Ratio: 2.1 (ref 1.2–2.2)
BILIRUBIN TOTAL: 0.5 mg/dL (ref 0.0–1.2)
BUN / CREAT RATIO: 9 (ref 9–23)
BUN: 9 mg/dL (ref 6–24)
CHLORIDE: 106 mmol/L (ref 96–106)
CO2: 22 mmol/L (ref 20–29)
Calcium: 9 mg/dL (ref 8.7–10.2)
Creatinine, Ser: 0.97 mg/dL (ref 0.57–1.00)
GFR calc non Af Amer: 66 mL/min/{1.73_m2} (ref >59)
GFR, EST AFRICAN AMERICAN: 76 mL/min/{1.73_m2} (ref >59)
GLUCOSE: 83 mg/dL (ref 65–99)
Globulin, Total: 1.9 g/dL (ref 1.5–4.5)
Potassium: 3.7 mmol/L (ref 3.5–5.2)
SODIUM: 142 mmol/L (ref 134–144)
TOTAL PROTEIN: 5.9 g/dL — AB (ref 6.0–8.5)

## 2017-10-01 ENCOUNTER — Telehealth: Payer: Self-pay | Admitting: *Deleted

## 2017-10-01 NOTE — Telephone Encounter (Signed)
Pt states she would like results of microalbumin/ POCT U/A. Please advise.

## 2017-10-11 ENCOUNTER — Ambulatory Visit: Payer: Medicaid Other | Attending: Internal Medicine

## 2017-10-11 DIAGNOSIS — I5032 Chronic diastolic (congestive) heart failure: Secondary | ICD-10-CM | POA: Diagnosis present

## 2017-10-11 NOTE — Progress Notes (Signed)
Patient here for lab visit  

## 2017-10-12 LAB — COMPREHENSIVE METABOLIC PANEL
ALT: 11 IU/L (ref 0–32)
AST: 19 IU/L (ref 0–40)
Albumin/Globulin Ratio: 2.1 (ref 1.2–2.2)
Albumin: 4.4 g/dL (ref 3.5–5.5)
Alkaline Phosphatase: 69 IU/L (ref 39–117)
BUN/Creatinine Ratio: 11 (ref 9–23)
BUN: 13 mg/dL (ref 6–24)
Bilirubin Total: 1.1 mg/dL (ref 0.0–1.2)
CALCIUM: 9.5 mg/dL (ref 8.7–10.2)
CO2: 17 mmol/L — AB (ref 20–29)
CREATININE: 1.2 mg/dL — AB (ref 0.57–1.00)
Chloride: 104 mmol/L (ref 96–106)
GFR calc Af Amer: 59 mL/min/{1.73_m2} — ABNORMAL LOW (ref >59)
GFR, EST NON AFRICAN AMERICAN: 51 mL/min/{1.73_m2} — AB (ref >59)
GLOBULIN, TOTAL: 2.1 g/dL (ref 1.5–4.5)
Glucose: 77 mg/dL (ref 65–99)
Potassium: 3.6 mmol/L (ref 3.5–5.2)
SODIUM: 143 mmol/L (ref 134–144)
Total Protein: 6.5 g/dL (ref 6.0–8.5)

## 2017-10-12 LAB — CBC WITH DIFFERENTIAL/PLATELET
Basophils Absolute: 0 10*3/uL (ref 0.0–0.2)
Basos: 0 %
EOS (ABSOLUTE): 0 10*3/uL (ref 0.0–0.4)
EOS: 0 %
HEMATOCRIT: 42.9 % (ref 34.0–46.6)
Hemoglobin: 12.9 g/dL (ref 11.1–15.9)
IMMATURE GRANULOCYTES: 0 %
Immature Grans (Abs): 0 10*3/uL (ref 0.0–0.1)
LYMPHS: 12 %
Lymphocytes Absolute: 2.7 10*3/uL (ref 0.7–3.1)
MCH: 25.4 pg — ABNORMAL LOW (ref 26.6–33.0)
MCHC: 30.1 g/dL — ABNORMAL LOW (ref 31.5–35.7)
MCV: 84 fL (ref 79–97)
MONOCYTES: 6 %
MONOS ABS: 1.4 10*3/uL — AB (ref 0.1–0.9)
NEUTROS PCT: 82 %
Neutrophils Absolute: 17.9 10*3/uL — ABNORMAL HIGH (ref 1.4–7.0)
Platelets: 173 10*3/uL (ref 150–379)
RBC: 5.08 x10E6/uL (ref 3.77–5.28)
RDW: 15.4 % (ref 12.3–15.4)
WBC: 22 10*3/uL — AB (ref 3.4–10.8)

## 2017-10-23 ENCOUNTER — Telehealth: Payer: Self-pay | Admitting: Internal Medicine

## 2017-10-23 NOTE — Telephone Encounter (Signed)
Pt Requested a call back to speak with her PCP, to express her gratitude and congratulate his promotion Please follow up

## 2017-10-25 ENCOUNTER — Other Ambulatory Visit: Payer: Medicaid Other

## 2017-11-23 ENCOUNTER — Other Ambulatory Visit: Payer: Self-pay | Admitting: Internal Medicine

## 2017-11-23 DIAGNOSIS — R630 Anorexia: Secondary | ICD-10-CM

## 2017-11-27 ENCOUNTER — Ambulatory Visit: Payer: Medicaid Other | Admitting: Family Medicine

## 2017-11-28 ENCOUNTER — Ambulatory Visit: Payer: Medicaid Other | Admitting: Internal Medicine

## 2017-12-12 ENCOUNTER — Encounter: Payer: Self-pay | Admitting: Family Medicine

## 2017-12-12 ENCOUNTER — Ambulatory Visit: Payer: PRIVATE HEALTH INSURANCE | Attending: Family Medicine | Admitting: Family Medicine

## 2017-12-12 VITALS — BP 121/76 | HR 54 | Temp 97.5°F | Ht 61.0 in | Wt 108.0 lb

## 2017-12-12 DIAGNOSIS — Z7952 Long term (current) use of systemic steroids: Secondary | ICD-10-CM | POA: Insufficient documentation

## 2017-12-12 DIAGNOSIS — Z9889 Other specified postprocedural states: Secondary | ICD-10-CM | POA: Diagnosis not present

## 2017-12-12 DIAGNOSIS — J84116 Cryptogenic organizing pneumonia: Secondary | ICD-10-CM | POA: Diagnosis not present

## 2017-12-12 DIAGNOSIS — E559 Vitamin D deficiency, unspecified: Secondary | ICD-10-CM | POA: Insufficient documentation

## 2017-12-12 DIAGNOSIS — Z90710 Acquired absence of both cervix and uterus: Secondary | ICD-10-CM | POA: Insufficient documentation

## 2017-12-12 DIAGNOSIS — M797 Fibromyalgia: Secondary | ICD-10-CM | POA: Diagnosis not present

## 2017-12-12 DIAGNOSIS — M329 Systemic lupus erythematosus, unspecified: Secondary | ICD-10-CM | POA: Diagnosis not present

## 2017-12-12 DIAGNOSIS — G8929 Other chronic pain: Secondary | ICD-10-CM | POA: Insufficient documentation

## 2017-12-12 DIAGNOSIS — Z79891 Long term (current) use of opiate analgesic: Secondary | ICD-10-CM | POA: Insufficient documentation

## 2017-12-12 DIAGNOSIS — Z91013 Allergy to seafood: Secondary | ICD-10-CM | POA: Insufficient documentation

## 2017-12-12 DIAGNOSIS — R300 Dysuria: Secondary | ICD-10-CM | POA: Insufficient documentation

## 2017-12-12 DIAGNOSIS — Z888 Allergy status to other drugs, medicaments and biological substances status: Secondary | ICD-10-CM | POA: Diagnosis not present

## 2017-12-12 DIAGNOSIS — J8410 Pulmonary fibrosis, unspecified: Secondary | ICD-10-CM | POA: Diagnosis not present

## 2017-12-12 DIAGNOSIS — K219 Gastro-esophageal reflux disease without esophagitis: Secondary | ICD-10-CM | POA: Diagnosis present

## 2017-12-12 DIAGNOSIS — J449 Chronic obstructive pulmonary disease, unspecified: Secondary | ICD-10-CM | POA: Insufficient documentation

## 2017-12-12 DIAGNOSIS — J849 Interstitial pulmonary disease, unspecified: Secondary | ICD-10-CM

## 2017-12-12 DIAGNOSIS — M3219 Other organ or system involvement in systemic lupus erythematosus: Secondary | ICD-10-CM

## 2017-12-12 DIAGNOSIS — Z79899 Other long term (current) drug therapy: Secondary | ICD-10-CM | POA: Diagnosis not present

## 2017-12-12 DIAGNOSIS — R1013 Epigastric pain: Secondary | ICD-10-CM

## 2017-12-12 HISTORY — DX: Systemic lupus erythematosus, unspecified: M32.9

## 2017-12-12 LAB — POCT URINALYSIS DIPSTICK
BILIRUBIN UA: NEGATIVE
Glucose, UA: NEGATIVE
KETONES UA: NEGATIVE
Leukocytes, UA: NEGATIVE
Nitrite, UA: NEGATIVE
PH UA: 6 (ref 5.0–8.0)
Protein, UA: NEGATIVE
Spec Grav, UA: 1.01 (ref 1.010–1.025)
UROBILINOGEN UA: 0.2 U/dL

## 2017-12-12 NOTE — Progress Notes (Signed)
Subjective:  Patient ID: [name] [name], female    DOB: 08/08/1961  Age: 57 y.o. 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 Doreene Burke.  Her SLE is managed by Rheumatology, Dr. Stann Mainland at [facility] with her last visit on 10/29/17 -plan was to continue Myfortic, Plaquenil and she was placed on oral fluconazole for thrush, treated for UTI, referred to [facility] spine [facility] due to right lower extremity weakness. Also seen by pulmonary, Dr. Blenda Nicely for management of interstitial lung disease on prednisone was reduced to 5 mg daily.  She presents today complaining of dysuria, orange urine but no flank pain or fever.  She has had a slight abdominal pain since she commence Plaquenil.  Does have chronic ankle swelling for which she is on Lasix. Her reflux symptoms are controlled and she is tolerating her PPI. She would like to have blood work today to check her kidney and liver functions.  Past Medical History:  Diagnosis Date  . Chronic pain   . COPD (chronic obstructive pulmonary disease) ([place])   . Fibromyalgia   . Migraine   . Migraine without aura, with intractable migraine, so stated, without mention of status migrainosus 05/18/2014  . Pneumonia   . Pulmonary fibrosis ([place])   . Seizures ([place])     Past Surgical History:  Procedure Laterality Date  . ABDOMINAL HYSTERECTOMY    . LUNG BIOPSY Left 11/21/2013   Procedure: LUNG BIOPSY;  Surgeon: Grace Isaac, MD;  Location: [place];  Service: Thoracic;  Laterality: Left;  Marland Kitchen VIDEO ASSISTED THORACOSCOPY Left 11/21/2013   Procedure: VIDEO ASSISTED THORACOSCOPY;  Surgeon: Grace Isaac, MD;  Location: [place];  Service: Thoracic;  Laterality: Left;  Marland Kitchen VIDEO  BRONCHOSCOPY N/A 11/21/2013   Procedure: VIDEO BRONCHOSCOPY;  Surgeon: Grace Isaac, MD;  Location[facility] OR;  Service: Thoracic;  Laterality: N/A;    Allergies  Allergen Reactions  . Shellfish Allergy Anaphylaxis  . Gabapentin     rash     Outpatient Medications Prior to Visit  Medication Sig Dispense Refill  . atorvastatin (LIPITOR) 40 MG tablet Take 1 tablet (40 mg total) by mouth daily. 90 tablet 3  . Cyanocobalamin (B-12) 2000 MCG TABS Take 2,000 mcg by mouth daily. 30 tablet 3  . etodolac (LODINE) 500 MG tablet Take 1 tablet (500 mg total) by mouth 2 (two) times daily. 30 tablet 0  . fentaNYL (DURAGESIC - DOSED MCG/HR) 50 MCG/HR Place 1 patch (50 mcg total) onto the skin every 3 (three) days. 5 patch 0  . folic acid (FOLVITE) 1 MG tablet Take 1 tablet (1 mg total) by mouth daily. 90 tablet 3  . furosemide (LASIX) 20 MG tablet Take 1 tablet (20 mg total) by mouth daily. 90 tablet 3  . hydroxychloroquine (PLAQUENIL) 200 MG tablet 254m (1 pill) alternating with 4057m(2 pills) every other day    . levETIRAcetam (KEPPRA) 500 MG tablet TAKE 1 TABLET(500 MG) BY MOUTH TWICE DAILY 60 tablet 5  . megestrol (MEGACE) 40 MG/ML suspension SHAKE LIQUID AND TAKE 10 ML(400 MG) BY MOUTH DAILY 480 mL 0  . mycophenolate (CELLCEPT) 500 MG tablet 1,000 mg 2 (two) times daily.  11  . mycophenolate (MYFORTIC) 360 MG TBEC EC tablet Take  by mouth.    . Naloxone HCl (EVZIO) 0.4 MG/0.4ML SOAJ as needed.     . OXYGEN Inhale 2 L/min into the lungs at bedtime.    . pantoprazole (PROTONIX) 40 MG tablet Take 1 tablet (40 mg total) by mouth 2 (two) times daily. 60 tablet 11  . polyethylene glycol (MIRALAX / GLYCOLAX) packet Take 17 g by mouth daily as needed for mild constipation or moderate constipation. 14 each 0  . predniSONE (DELTASONE) 10 MG tablet   3  . pregabalin (LYRICA) 300 MG capsule Take 300 mg by mouth 2 (two) times daily.    . rizatriptan (MAXALT) 10 MG tablet Take 1 tablet (10 mg total) by mouth  daily as needed for migraine. May repeat in 2 hours if needed 10 tablet 5  . sulfamethoxazole-trimethoprim (BACTRIM,SEPTRA) 400-80 MG tablet TK 1 T PO QD  6  . SYMBICORT 80-4.5 MCG/ACT inhaler Inhale 2 puffs into the lungs 2 (two) times daily.   1  . topiramate (TOPAMAX) 25 MG tablet 5 tablets daily 150 tablet 11  . traMADol (ULTRAM) 50 MG tablet Take 1-2 tablets (50-100 mg total) by mouth every 4 (four) hours as needed (cough). 84 tablet 0  . Vitamin D, Ergocalciferol, (DRISDOL) 50000 units CAPS capsule Take 1 capsule (50,000 Units total) by mouth every 7 (seven) days. 12 capsule 3   Facility-Administered Medications Prior to Visit  Medication Dose Route Frequency Provider Last Rate Last Dose  . 0.9 %  sodium chloride infusion  500 mL Intravenous Continuous Ladene Artist, MD        ROS Review of Systems  Constitutional: Negative for activity change, appetite change and fatigue.  HENT: Negative for congestion, sinus pressure and sore throat.   Eyes: Negative for visual disturbance.  Respiratory: Negative for cough, chest tightness, shortness of breath and wheezing.   Cardiovascular: Negative for chest pain and palpitations.  Gastrointestinal: Positive for abdominal pain. Negative for abdominal distention and constipation.  Endocrine: Negative for polydipsia.  Genitourinary: Positive for dysuria. Negative for frequency.  Musculoskeletal: Negative for arthralgias and back pain.  Skin: Negative for rash.  Neurological: Positive for weakness. Negative for tremors, light-headedness and numbness.  Hematological: Does not bruise/bleed easily.  Psychiatric/Behavioral: Negative for agitation and behavioral problems.    Objective:  BP 121/76   Pulse (!) 54   Temp (!) 97.5 F (36.4 C) (Oral)   Ht _0  (1.549 m)   Wt 108 lb (49 kg)   SpO2 100%   BMI 20.41 kg/m   BP/Weight 12/12/2017 08/29/2017 64/33/2951  Systolic BP 884 90 166  Diastolic BP 76 54 73  Wt. (Lbs) 108 108.6 109  BMI  20.41 20.52 20.6      Physical Exam  Constitutional: She is oriented to person, place, and time. She appears well-developed and well-nourished.  Cardiovascular: Normal rate, normal heart sounds and intact distal pulses.  No murmur heard. Pulmonary/Chest: Effort normal and breath sounds normal. She has no wheezes. She has no rales. She exhibits no tenderness.  Abdominal: Soft. Bowel sounds are normal. She exhibits no distension and no mass. There is no tenderness.  Musculoskeletal: Normal range of motion. She exhibits edema (2+ non pitting pedal edema).  RLE slightly reduced strength  Neurological: She is alert and oriented to person, place, and time.  Skin: Skin is warm and dry.  Psychiatric: She has a normal mood and affect.     Assessment & Plan:   1. Gastroesophageal reflux disease without esophagitis Stable Continue  PPI  2. Fibromyalgia Currently on Lyrica  3. Interstitial lung disease ([place]) Closely followed by pulmonary - Dr Blenda Nicely - mycophenolate (MYFORTIC) 360 MG TBEC EC tablet; Take by mouth.  4. Abdominal pain, chronic, epigastric She attributes this to Plaquenil Cannot exclude GERD vs gastritis as underlying etiology Avoid late meals, avoid recumbency up to 2 hours post meals  5. Other systemic lupus erythematosus with other organ involvement [facility]) Doing well on Plaquenil Keep appointment with rheumatology - [facility]e [facility]col[facility] at [facility] - hydroxychloroquine (PLAQUENIL) 200 MG tablet; 231m (1 pill) alternating with 4081m(2 pills) every other day - mycophenolate (MYFORTIC) 360 MG TBEC EC tablet; Take by mouth. - CMP14+EGFR  6. Vitamin D deficiency Previously on vitamin D replacement We will check levels prior to refill - VITAMIN D 25 Hydroxy (Vit-D Deficiency, Fractures)  7. Dysuria UA negative for UTI, positive for trace blood Completed course of antibiotics last month Send of urine culture - POCT urinalysis dipstick - Urine Culture   No  orders of the defined types were placed in this encounter.   Follow-up: Return in about 3 months (around 03/14/2018) for Follow-up of chronic medical conditions.   EnCharlott RakesD

## 2017-12-13 LAB — CMP14+EGFR
ALK PHOS: 118 IU/L — AB (ref 39–117)
ALT: 9 IU/L (ref 0–32)
AST: 14 IU/L (ref 0–40)
Albumin/Globulin Ratio: 2.2 (ref 1.2–2.2)
Albumin: 3.9 g/dL (ref 3.5–5.5)
BILIRUBIN TOTAL: 0.4 mg/dL (ref 0.0–1.2)
BUN / CREAT RATIO: 11 (ref 9–23)
BUN: 9 mg/dL (ref 6–24)
CHLORIDE: 110 mmol/L — AB (ref 96–106)
CO2: 19 mmol/L — ABNORMAL LOW (ref 20–29)
Calcium: 8.7 mg/dL (ref 8.7–10.2)
Creatinine, Ser: 0.83 mg/dL (ref 0.57–1.00)
GFR calc Af Amer: 92 mL/min/{1.73_m2} (ref >59)
GFR calc non Af Amer: 80 mL/min/{1.73_m2} (ref >59)
GLOBULIN, TOTAL: 1.8 g/dL (ref 1.5–4.5)
GLUCOSE: 63 mg/dL — AB (ref 65–99)
Potassium: 3 mmol/L — ABNORMAL LOW (ref 3.5–5.2)
SODIUM: 147 mmol/L — AB (ref 134–144)
Total Protein: 5.7 g/dL — ABNORMAL LOW (ref 6.0–8.5)

## 2017-12-13 LAB — VITAMIN D 25 HYDROXY (VIT D DEFICIENCY, FRACTURES): Vit D, 25-Hydroxy: 14.6 ng/mL — ABNORMAL LOW (ref 30.0–100.0)

## 2017-12-13 MED ORDER — POTASSIUM CHLORIDE ER 10 MEQ PO TBCR: 10.0000 meq | EXTENDED_RELEASE_TABLET | Freq: Every day | ORAL | 1 refills | Status: DC

## 2017-12-13 MED ORDER — VITAMIN D (ERGOCALCIFEROL) 1.25 MG (50000 UNIT) PO CAPS: 50000.0000 [IU] | ORAL_CAPSULE | ORAL | 3 refills | Status: DC

## 2017-12-14 ENCOUNTER — Telehealth: Payer: Self-pay | Admitting: Adult Health

## 2017-12-14 ENCOUNTER — Other Ambulatory Visit: Payer: Self-pay | Admitting: Adult Health

## 2017-12-14 DIAGNOSIS — R569 Unspecified convulsions: Secondary | ICD-10-CM

## 2017-12-14 LAB — URINE CULTURE: ORGANISM ID, BACTERIA: NO GROWTH

## 2017-12-14 NOTE — Telephone Encounter (Signed)
Error

## 2018-01-03 ENCOUNTER — Encounter: Payer: Self-pay | Admitting: Adult Health

## 2018-01-03 ENCOUNTER — Ambulatory Visit: Payer: Medicaid Other | Admitting: Adult Health

## 2018-01-03 VITALS — BP 88/62 | HR 68 | Ht 61.0 in | Wt 110.0 lb

## 2018-01-03 DIAGNOSIS — R29898 Other symptoms and signs involving the musculoskeletal system: Secondary | ICD-10-CM | POA: Diagnosis not present

## 2018-01-03 DIAGNOSIS — R569 Unspecified convulsions: Secondary | ICD-10-CM

## 2018-01-03 MED ORDER — LEVETIRACETAM 500 MG PO TABS: ORAL_TABLET | ORAL | 11 refills | Status: DC

## 2018-01-03 NOTE — Progress Notes (Signed)
I have read the note, and I agree with the clinical assessment and plan.  Charles K Willis   

## 2018-01-03 NOTE — Progress Notes (Signed)
PATIENT: [name] [name] DOB: 1961-08-31  REASON FOR VISIT: follow up HISTORY FROM: patient  HISTORY OF PRESENT ILLNESS: Today 01/03/18 [name] [name] is a 57 year old female with a history of migraine headaches and seizures.  She returns today for follow-up.  She continues on Keppra.  She denies any seizure events.  She reports that her headaches are under good control with Topamax.  She states that she had 2 headaches a week.  The location can vary.  She reports that she does have photophobia, phonophobia and nausea but denies vomiting.  The patient states that she had an appointment with her rheumatologist in January.  He ordered a x-ray of the neck and nerve conduction study with EMG for right leg weakness.  She states that she did have x-rays but she was unable to go to [facility] for EMG.  She is requesting our office to complete this test if possible.  She states that her right leg weakness started within the last 6 months.  She states that it is not gotten worse but has also not improved.  He denies any changes with bowel or bladder.  She had an MRI of the lumbar spine in 2016 however I have not reviewed these results.  She returns today for an evaluation.  HISTORY 07/05/17:  [name] [name] is a 57 year old female with a history of migraine headaches and seizures. She returns today for follow-up. She remains on Keppra 500 mg twice a day. She denies any seizure events. She is able to complete all ADLs independently.  At the last visit with Dr. Anne Hahn her headaches had worsened and she was unable to tolerate Topamax. She was instructed to wean off Topamax and start Effexor. However she never started this medication due to potential side effects. She remains on Topamax 125 mg daily. She reports that her headaches are under good control at this time. She reports that she has 2 headaches a week. The headaches typically occur on the right side in the temporal region. She does have photophobia, phonophobia as well as  nausea. She states that she can take Maxalt and the headache resolves within 30 minutes. She states that she's been seeing a rheumatologist and pulmonologist at [facility]. She reports that she's been diagnosed with lupus and pulmonary fibrosis. She returns today for follow-up.   REVIEW OF SYSTEMS: Out of a complete 14 system review of symptoms, the patient complains only of the following symptoms, and all other reviewed systems are negative.  See HPI  ALLERGIES: Allergies  Allergen Reactions  . Shellfish Allergy Anaphylaxis  . Gabapentin     rash    HOME MEDICATIONS: Outpatient Medications Prior to Visit  Medication Sig Dispense Refill  . atorvastatin (LIPITOR) 40 MG tablet Take 1 tablet (40 mg total) by mouth daily. 90 tablet 3  . Cyanocobalamin (B-12) 2000 MCG TABS Take 2,000 mcg by mouth daily. 30 tablet 3  . etodolac (LODINE) 500 MG tablet Take 1 tablet (500 mg total) by mouth 2 (two) times daily. 30 tablet 0  . fentaNYL (DURAGESIC - DOSED MCG/HR) 50 MCG/HR Place 1 patch (50 mcg total) onto the skin every 3 (three) days. 5 patch 0  . folic acid (FOLVITE) 1 MG tablet Take 1 tablet (1 mg total) by mouth daily. 90 tablet 3  . furosemide (LASIX) 20 MG tablet Take 1 tablet (20 mg total) by mouth daily. 90 tablet 3  . hydroxychloroquine (PLAQUENIL) 200 MG tablet 200mg  (1 pill) alternating with 400mg  (2 pills) every other  day    . levETIRAcetam (KEPPRA) 500 MG tablet TAKE 1 TABLET(500 MG) BY MOUTH TWICE DAILY 60 tablet 0  . megestrol (MEGACE) 40 MG/ML suspension SHAKE LIQUID AND TAKE 10 ML(400 MG) BY MOUTH DAILY 480 mL 0  . mycophenolate (CELLCEPT) 500 MG tablet 1,000 mg 2 (two) times daily.  11  . mycophenolate (MYFORTIC) 360 MG TBEC EC tablet Take by mouth.    . Naloxone HCl (EVZIO) 0.4 MG/0.4ML SOAJ as needed.     . OXYGEN Inhale 2 L/min into the lungs at bedtime.    . pantoprazole (PROTONIX) 40 MG tablet Take 1 tablet (40 mg total) by mouth 2 (two) times daily. 60 tablet 11  .  polyethylene glycol (MIRALAX / GLYCOLAX) packet Take 17 g by mouth daily as needed for mild constipation or moderate constipation. 14 each 0  . potassium chloride (KLOR-CON 10) 10 MEQ tablet Take 1 tablet (10 mEq total) by mouth daily. 30 tablet 1  . predniSONE (DELTASONE) 10 MG tablet   3  . pregabalin (LYRICA) 300 MG capsule Take 300 mg by mouth 2 (two) times daily.    . rizatriptan (MAXALT) 10 MG tablet Take 1 tablet (10 mg total) by mouth daily as needed for migraine. May repeat in 2 hours if needed 10 tablet 5  . sulfamethoxazole-trimethoprim (BACTRIM,SEPTRA) 400-80 MG tablet TK 1 T PO QD  6  . SYMBICORT 80-4.5 MCG/ACT inhaler Inhale 2 puffs into the lungs 2 (two) times daily.   1  . topiramate (TOPAMAX) 25 MG tablet 5 tablets daily 150 tablet 11  . traMADol (ULTRAM) 50 MG tablet Take 1-2 tablets (50-100 mg total) by mouth every 4 (four) hours as needed (cough). 84 tablet 0  . Vitamin D, Ergocalciferol, (DRISDOL) 50000 units CAPS capsule Take 1 capsule (50,000 Units total) by mouth every 7 (seven) days. 12 capsule 3   Facility-Administered Medications Prior to Visit  Medication Dose Route Frequency Provider Last Rate Last Dose  . 0.9 %  sodium chloride infusion  500 mL Intravenous Continuous Meryl Dare, MD        PAST MEDICAL HISTORY: Past Medical History:  Diagnosis Date  . Chronic pain   . COPD (chronic obstructive pulmonary disease) (HCC)   . Fibromyalgia   . Migraine   . Migraine without aura, with intractable migraine, so stated, without mention of status migrainosus 05/18/2014  . Pneumonia   . Pulmonary fibrosis (HCC)   . Seizures (HCC)     PAST SURGICAL HISTORY: Past Surgical History:  Procedure Laterality Date  . ABDOMINAL HYSTERECTOMY    . LUNG BIOPSY Left 11/21/2013   Procedure: LUNG BIOPSY;  Surgeon: Delight Ovens, MD;  Location: [facility] OR;  Service: Thoracic;  Laterality: Left;  Marland Kitchen VIDEO ASSISTED THORACOSCOPY Left 11/21/2013   Procedure: VIDEO ASSISTED  THORACOSCOPY;  Surgeon: Delight Ovens, MD;  Location: [facility] OR;  Service: Thoracic;  Laterality: Left;  Marland Kitchen VIDEO BRONCHOSCOPY N/A 11/21/2013   Procedure: VIDEO BRONCHOSCOPY;  Surgeon: Delight Ovens, MD;  Location: [facility] OR;  Service: Thoracic;  Laterality: N/A;    FAMILY HISTORY: Family History  Problem Relation Age of Onset  . Hypertension Mother   . Hepatitis C Mother   . Diabetes Father   . Hypertension Father   . Bipolar disorder Brother   . Multiple sclerosis Sister     SOCIAL HISTORY: Social History   Socioeconomic History  . Marital status: Single    Spouse name: Not on file  . Number of children:  0  . Years of education: college 3  . Highest education level: Not on file  Occupational History  . Occupation: unemployed  Social Needs  . Financial resource strain: Not on file  . Food insecurity:    Worry: Not on file    Inability: Not on file  . Transportation needs:    Medical: Not on file    Non-medical: Not on file  Tobacco Use  . Smoking status: Former Smoker    Packs/day: 0.00    Years: 10.00    Pack years: 0.00    Types: Cigarettes    Last attempt to quit: 09/18/2004    Years since quitting: 13.3  . Smokeless tobacco: Never Used  Substance and Sexual Activity  . Alcohol use: No  . Drug use: No  . Sexual activity: Never  Lifestyle  . Physical activity:    Days per week: Not on file    Minutes per session: Not on file  . Stress: Not on file  Relationships  . Social connections:    Talks on phone: Not on file    Gets together: Not on file    Attends religious service: Not on file    Active member of club or organization: Not on file    Attends meetings of clubs or organizations: Not on file    Relationship status: Not on file  . Intimate partner violence:    Fear of current or ex partner: Not on file    Emotionally abused: Not on file    Physically abused: Not on file    Forced sexual activity: Not on file  Other Topics Concern  . Not on file    Social History Narrative  . Not on file      PHYSICAL EXAM  Vitals:   01/03/18 1323  BP: (!) 88/62  Pulse: 68  Weight: 110 lb (49.9 kg)  Height: 5\' 1"  (1.549 m)   Body mass index is 20.78 kg/m.  Generalized: Well developed, in no acute distress   Neurological examination  Mentation: Alert oriented to time, place, history taking. Follows all commands speech and language fluent Cranial nerve II-XII: Pupils were equal round reactive to light. Extraocular movements were full, visual field were full on confrontational test. Facial sensation and strength were normal. Uvula tongue midline. Head turning and shoulder shrug  were normal and symmetric. Motor: The motor testing reveals 5 over 5 strength of all 4 extremities except 3/4 strength in RLE. Good symmetric motor tone is noted throughout.  Sensory: Sensory testing is intact to soft touch on all 4 extremities. No evidence of extinction is noted.  Coordination: Cerebellar testing reveals good finger-nose-finger and heel-to-shin bilaterally.  Gait and station: Patient uses a walker when ambulating Reflexes: Deep tendon reflexes are symmetric and normal bilaterally.   DIAGNOSTIC DATA (LABS, IMAGING, TESTING) - I reviewed patient records, labs, notes, testing and imaging myself where available.  Lab Results  Component Value Date   WBC 22.0 (HH) 10/11/2017   HGB 12.9 10/11/2017   HCT 42.9 10/11/2017   MCV 84 10/11/2017   PLT 173 10/11/2017      Component Value Date/Time   NA 147 (H) 12/12/2017 1441   K 3.0 (L) 12/12/2017 1441   CL 110 (H) 12/12/2017 1441   CO2 19 (L) 12/12/2017 1441   GLUCOSE 63 (L) 12/12/2017 1441   GLUCOSE 73 09/27/2016 1006   BUN 9 12/12/2017 1441   CREATININE 0.83 12/12/2017 1441   CREATININE 1.27 (H) 09/27/2016 1006  CALCIUM 8.7 12/12/2017 1441   PROT 5.7 (L) 12/12/2017 1441   ALBUMIN 3.9 12/12/2017 1441   AST 14 12/12/2017 1441   ALT 9 12/12/2017 1441   ALKPHOS 118 (H) 12/12/2017 1441    BILITOT 0.4 12/12/2017 1441   GFRNONAA 80 12/12/2017 1441   GFRNONAA 48 (L) 09/27/2016 1006   GFRAA 92 12/12/2017 1441   GFRAA 55 (L) 09/27/2016 1006   Lab Results  Component Value Date   CHOL 191 05/23/2017   HDL 46 05/23/2017   LDLCALC 122 (H) 05/23/2017   TRIG 113 05/23/2017   CHOLHDL 4.2 05/23/2017   Lab Results  Component Value Date   HGBA1C 5.6 09/10/2014   Lab Results  Component Value Date   VITAMINB12 231 10/07/2013   Lab Results  Component Value Date   TSH 0.972 09/10/2014      ASSESSMENT AND PLAN 57 y.o. year old female  has a past medical history of Chronic pain, COPD (chronic obstructive pulmonary disease) (HCC), Fibromyalgia, Migraine, Migraine without aura, with intractable migraine, so stated, without mention of status migrainosus (05/18/2014), Pneumonia, Pulmonary fibrosis (HCC), and Seizures (HCC). here with:  1. Seizures 2. Migraine headache 3. Right leg weakness  Overall the patient is doing well.  She will continue on Keppra for seizure prevention and Topamax for migraine prevention.  Patient is complaining of right leg weakness that started approximately 6 months ago.  Her rheumatologist ordered nerve conduction study with EMG but she would rather have it here so she does not have to travel.  I will order that today.  Patient advised that if her symptoms worsen or she develops new symptoms she should let us know.  She will follow-up in 6 months or sooner if needed.   I spent 15 minutes with the patient. 50% of this time was spent   Butch PennyMegan Jamarie Mussa, MSN, NP-C 01/03/2018, 1:25 PM [facility] [address], [address] [place], [place] [address] [phone]

## 2018-01-03 NOTE — Patient Instructions (Signed)
Your Plan:  Continue Keppra and Topamax Nerve conduction study with EMG ordered If your symptoms worsen or you develop new symptoms please let us know.   Thank you for [facility] see [facility] at [facility]. I hope we have been able to provide you high quality care today.  You may receive a patient satisfaction survey over the next few weeks. We would appreciate your feedback and comments so that we may continue to improve ourselves and the health of our patients.

## 2018-02-06 ENCOUNTER — Encounter: Payer: Medicaid Other | Admitting: Neurology

## 2018-02-21 ENCOUNTER — Other Ambulatory Visit: Payer: Self-pay | Admitting: Nephrology

## 2018-02-21 DIAGNOSIS — N179 Acute kidney failure, unspecified: Secondary | ICD-10-CM

## 2018-02-21 DIAGNOSIS — G8929 Other chronic pain: Secondary | ICD-10-CM

## 2018-02-21 DIAGNOSIS — E663 Overweight: Secondary | ICD-10-CM

## 2018-02-21 DIAGNOSIS — R609 Edema, unspecified: Secondary | ICD-10-CM

## 2018-02-27 ENCOUNTER — Encounter: Payer: PRIVATE HEALTH INSURANCE | Admitting: Neurology

## 2018-03-01 ENCOUNTER — Other Ambulatory Visit: Payer: PRIVATE HEALTH INSURANCE

## 2018-03-08 ENCOUNTER — Other Ambulatory Visit: Payer: PRIVATE HEALTH INSURANCE

## 2018-03-12 ENCOUNTER — Other Ambulatory Visit: Payer: Self-pay | Admitting: Family Medicine

## 2018-03-13 ENCOUNTER — Ambulatory Visit (INDEPENDENT_AMBULATORY_CARE_PROVIDER_SITE_OTHER): Payer: Medicaid Other | Admitting: Neurology

## 2018-03-13 ENCOUNTER — Ambulatory Visit: Payer: Self-pay | Admitting: Neurology

## 2018-03-13 ENCOUNTER — Encounter: Payer: Self-pay | Admitting: Neurology

## 2018-03-13 DIAGNOSIS — R29898 Other symptoms and signs involving the musculoskeletal system: Secondary | ICD-10-CM

## 2018-03-13 DIAGNOSIS — M797 Fibromyalgia: Secondary | ICD-10-CM

## 2018-03-13 NOTE — Procedures (Signed)
     HISTORY:  [name] [name] is a 57 year old patient with a history of lupus who has developed some weakness of the right lower extremity.  The patient uses a walker for ambulation, she denies any recent falls.  She is followed through rheumatology for her lupus.  She does have chronic low back pain.  Prior MRI of the lumbar spine done in 2016 did not show definite nerve root impingement or significant spinal stenosis.  NERVE CONDUCTION STUDIES:  Nerve conduction studies were performed on the right upper extremity. The distal motor latencies and motor amplitudes for the median and ulnar nerves were within normal limits. The nerve conduction velocities for these nerves were also normal. The sensory latencies for the median and ulnar nerves were normal. The F wave latency for the ulnar nerve was within normal limits.  Nerve conduction studies were performed on both lower extremities. The distal motor latencies and motor amplitudes for the posterior tibial nerves were within normal limits.  The distal motor latencies for the peroneal nerves were normal bilaterally with low motor amplitudes bilaterally.  The nerve conduction velocities for these nerves were also normal. The sensory latencies for the peroneal and sural nerves were within normal limits. The F wave latencies for the posterior tibial nerves were within normal limits.    EMG STUDIES:  EMG study was performed on the right lower extremity:  The tibialis anterior muscle reveals 2 to 4K motor units with full recruitment. No fibrillations or positive waves were seen. The peroneus tertius muscle reveals 2 to 4K motor units with full recruitment. No fibrillations or positive waves were seen. The medial gastrocnemius muscle reveals 1 to 3K motor units with full recruitment. No fibrillations or positive waves were seen. The vastus lateralis muscle reveals 2 to 4K motor units with full recruitment. No fibrillations or positive waves were  seen. The iliopsoas muscle reveals 2 to 4K motor units with full recruitment. No fibrillations or positive waves were seen. The biceps femoris muscle (long head) reveals 2 to 4K motor units with full recruitment. No fibrillations or positive waves were seen. The lumbosacral paraspinal muscles were tested at 3 levels, and revealed no abnormalities of insertional activity at all 3 levels tested. There was good relaxation.   IMPRESSION:  Nerve conduction studies done on the right upper extremity and both lower extremities were relatively unremarkable with exception of some lowering of motor amplitudes of the peroneal nerves bilaterally which is of unclear clinical significance.  No clear evidence of a neuropathy is seen.  EMG evaluation of the right lower extremity was unremarkable, without evidence of a lumbar radiculopathy or a myopathic disorder.  Marlan Palau. Keith Suleiman Finigan MD 03/13/2018 3:22 PM  [facility] [address] [place], [place] [address]  Phone [phone] Fax [phone]

## 2018-03-13 NOTE — Progress Notes (Signed)
Please refer to EMG and nerve conduction study procedure note. 

## 2018-03-14 NOTE — Progress Notes (Signed)
[facility]    Nerve / Sites Muscle Latency Ref. Amplitude Ref. Rel Amp Segments Distance Velocity Ref. Area    ms ms mV mV %  cm m/s m/s mVms  R Median - APB     Wrist APB 3.5 ?4.4 6.5 ?4.0 100 Wrist - APB 7   28.1     Upper arm APB 7.3  5.5  83.3 Upper arm - Wrist 21 56 ?49 25.4  R Ulnar - ADM     Wrist ADM 2.8 ?3.3 7.3 ?6.0 100 Wrist - ADM 7   21.7     B.Elbow ADM 5.9  6.8  93.5 B.Elbow - Wrist 17 54 ?49 21.6     A.Elbow ADM 8.1  6.8  99.9 A.Elbow - B.Elbow 12 55 ?49 22.0         A.Elbow - Wrist      R Peroneal - EDB     Ankle EDB 4.5 ?6.5 1.4 ?2.0 100 Ankle - EDB 9   6.9     Fib head EDB 10.6  1.1  79.2 Fib head - Ankle 28 46 ?44 6.2     Pop fossa EDB 12.8  0.9  82.8 Pop fossa - Fib head 10 45 ?44 5.2         Pop fossa - Ankle      L Peroneal - EDB     Ankle EDB 4.9 ?6.5 1.7 ?2.0 100 Ankle - EDB 9   8.5     Fib head EDB 11.1  1.4  82.4 Fib head - Ankle 28 45 ?44 8.5     Pop fossa EDB 13.3  1.1  80 Pop fossa - Fib head 10 46 ?44 5.0         Pop fossa - Ankle      R Tibial - AH     Ankle AH 3.9 ?5.8 14.6 ?4.0 100 Ankle - AH 9   29.5     Pop fossa AH 11.6  9.6  65.9 Pop fossa - Ankle 34 44 ?41 24.4  L Tibial - AH     Ankle AH 4.2 ?5.8 16.5 ?4.0 100 Ankle - AH 9   37.7     Pop fossa AH 12.2  11.5  69.6 Pop fossa - Ankle 34 42 ?41 33.1                 SNC    Nerve / Sites Rec. Site Peak Lat Ref.  Amp Ref. Segments Distance    ms ms V V  cm  R Sural - Ankle (Calf)     Calf Ankle 3.4 ?4.4 7 ?6 Calf - Ankle 14  L Sural - Ankle (Calf)     Calf Ankle 3.9 ?4.4 10 ?6 Calf - Ankle 14  R Superficial peroneal - Ankle     Lat leg Ankle 4.0 ?4.4 9 ?6 Lat leg - Ankle 14  L Superficial peroneal - Ankle     Lat leg Ankle 3.5 ?4.4 9 ?6 Lat leg - Ankle 14  R Median - Orthodromic (Dig II, Mid palm)     Dig II Wrist 3.2 ?3.4 18 ?10 Dig II - Wrist 13  R Ulnar - Orthodromic, (Dig V, Mid palm)     Dig V Wrist 2.8 ?3.1 7 ?5 Dig V - Wrist 59                  F  Wave    Nerve F Lat Ref.  ms ms  R  Tibial - AH 47.2 ?56.0  L Tibial - AH 46.6 ?56.0  R Ulnar - ADM 26.6 ?32.0

## 2018-03-19 ENCOUNTER — Ambulatory Visit: Payer: Medicaid Other | Attending: Family Medicine | Admitting: Family Medicine

## 2018-03-19 ENCOUNTER — Telehealth: Payer: Self-pay | Admitting: *Deleted

## 2018-03-19 ENCOUNTER — Encounter: Payer: Self-pay | Admitting: Family Medicine

## 2018-03-19 VITALS — BP 96/64 | HR 53 | Temp 97.8°F | Ht 61.0 in | Wt 110.2 lb

## 2018-03-19 DIAGNOSIS — K219 Gastro-esophageal reflux disease without esophagitis: Secondary | ICD-10-CM | POA: Diagnosis not present

## 2018-03-19 DIAGNOSIS — Z90710 Acquired absence of both cervix and uterus: Secondary | ICD-10-CM | POA: Insufficient documentation

## 2018-03-19 DIAGNOSIS — E559 Vitamin D deficiency, unspecified: Secondary | ICD-10-CM | POA: Insufficient documentation

## 2018-03-19 DIAGNOSIS — J8410 Pulmonary fibrosis, unspecified: Secondary | ICD-10-CM | POA: Diagnosis not present

## 2018-03-19 DIAGNOSIS — J449 Chronic obstructive pulmonary disease, unspecified: Secondary | ICD-10-CM | POA: Diagnosis not present

## 2018-03-19 DIAGNOSIS — R399 Unspecified symptoms and signs involving the genitourinary system: Secondary | ICD-10-CM | POA: Diagnosis not present

## 2018-03-19 DIAGNOSIS — Z79899 Other long term (current) drug therapy: Secondary | ICD-10-CM | POA: Diagnosis not present

## 2018-03-19 DIAGNOSIS — G8929 Other chronic pain: Secondary | ICD-10-CM | POA: Insufficient documentation

## 2018-03-19 DIAGNOSIS — R531 Weakness: Secondary | ICD-10-CM | POA: Insufficient documentation

## 2018-03-19 DIAGNOSIS — M797 Fibromyalgia: Secondary | ICD-10-CM

## 2018-03-19 DIAGNOSIS — Z9889 Other specified postprocedural states: Secondary | ICD-10-CM | POA: Diagnosis not present

## 2018-03-19 DIAGNOSIS — Z91013 Allergy to seafood: Secondary | ICD-10-CM | POA: Diagnosis not present

## 2018-03-19 DIAGNOSIS — M3219 Other organ or system involvement in systemic lupus erythematosus: Secondary | ICD-10-CM

## 2018-03-19 DIAGNOSIS — Z1159 Encounter for screening for other viral diseases: Secondary | ICD-10-CM | POA: Diagnosis not present

## 2018-03-19 DIAGNOSIS — M329 Systemic lupus erythematosus, unspecified: Secondary | ICD-10-CM | POA: Diagnosis present

## 2018-03-19 DIAGNOSIS — Z888 Allergy status to other drugs, medicaments and biological substances status: Secondary | ICD-10-CM | POA: Diagnosis not present

## 2018-03-19 DIAGNOSIS — N390 Urinary tract infection, site not specified: Secondary | ICD-10-CM | POA: Diagnosis not present

## 2018-03-19 DIAGNOSIS — Z79891 Long term (current) use of opiate analgesic: Secondary | ICD-10-CM | POA: Diagnosis not present

## 2018-03-19 DIAGNOSIS — Z118 Encounter for screening for other infectious and parasitic diseases: Secondary | ICD-10-CM | POA: Diagnosis not present

## 2018-03-19 DIAGNOSIS — R1310 Dysphagia, unspecified: Secondary | ICD-10-CM | POA: Diagnosis not present

## 2018-03-19 LAB — POCT URINALYSIS DIPSTICK
Bilirubin, UA: NEGATIVE
Glucose, UA: NEGATIVE
KETONES UA: NEGATIVE
Leukocytes, UA: NEGATIVE
NITRITE UA: NEGATIVE
PH UA: 7 (ref 5.0–8.0)
PROTEIN UA: POSITIVE — AB
Spec Grav, UA: 1.025 (ref 1.010–1.025)
UROBILINOGEN UA: 1 U/dL

## 2018-03-19 MED ORDER — POTASSIUM CHLORIDE ER 10 MEQ PO TBCR: EXTENDED_RELEASE_TABLET | ORAL | 3 refills | Status: DC

## 2018-03-19 NOTE — Telephone Encounter (Signed)
-----   Message from Butch PennyMegan Millikan, NP sent at 03/19/2018  9:07 AM EDT ----- No clear neuropathy seen. EMG unremarkable. Please call patient.

## 2018-03-19 NOTE — Telephone Encounter (Signed)
Spoke with pt. and reviewed below EMG/NCV results.  She verbalized understanding of same and thanked me for the call/fim

## 2018-03-19 NOTE — Patient Instructions (Signed)
Food Choices for Gastroesophageal Reflux Disease, Adult When you have gastroesophageal reflux disease (GERD), the foods you eat and your eating habits are very important. Choosing the right foods can help ease your discomfort. What guidelines do I need to follow?  Choose fruits, vegetables, whole grains, and low-fat dairy products.  Choose low-fat meat, fish, and poultry.  Limit fats such as oils, salad dressings, butter, nuts, and avocado.  Keep a food diary. This helps you identify foods that cause symptoms.  Avoid foods that cause symptoms. These may be different for everyone.  Eat small meals often instead of 3 large meals a day.  Eat your meals slowly, in a place where you are relaxed.  Limit fried foods.  Cook foods using methods other than frying.  Avoid drinking alcohol.  Avoid drinking large amounts of liquids with your meals.  Avoid bending over or lying down until 2-3 hours after eating. What foods are not recommended? These are some foods and drinks that may make your symptoms worse: Vegetables  Tomatoes. Tomato juice. Tomato and spaghetti sauce. Chili peppers. Onion and garlic. Horseradish. Fruits  Oranges, grapefruit, and lemon (fruit and juice). Meats  High-fat meats, fish, and poultry. This includes hot dogs, ribs, ham, sausage, salami, and bacon. Dairy  Whole milk and chocolate milk. Sour cream. Cream. Butter. Ice cream. Cream cheese. Drinks  Coffee and tea. Bubbly (carbonated) drinks or energy drinks. Condiments  Hot sauce. Barbecue sauce. Sweets/Desserts  Chocolate and cocoa. Donuts. Peppermint and spearmint. Fats and Oils  High-fat foods. This includes French fries and potato chips. Other  Vinegar. Strong spices. This includes black pepper, white pepper, red pepper, cayenne, curry powder, cloves, ginger, and chili powder. The items listed above may not be a complete list of foods and drinks to avoid. Contact your dietitian for more information.    This information is not intended to replace advice given to you by your health care provider. Make sure you discuss any questions you have with your health care provider. Document Released: 03/05/2012 Document Revised: 02/10/2016 Document Reviewed: 07/09/2013 Elsevier Interactive Patient Education  2017 [employer]  

## 2018-03-20 ENCOUNTER — Encounter: Payer: Self-pay | Admitting: Family Medicine

## 2018-03-20 ENCOUNTER — Other Ambulatory Visit: Payer: Self-pay | Admitting: Family Medicine

## 2018-03-20 DIAGNOSIS — R1013 Epigastric pain: Principal | ICD-10-CM

## 2018-03-20 DIAGNOSIS — G8929 Other chronic pain: Secondary | ICD-10-CM

## 2018-03-20 LAB — CMP14+EGFR
A/G RATIO: 2.2 (ref 1.2–2.2)
ALBUMIN: 4.2 g/dL (ref 3.5–5.5)
ALT: 6 IU/L (ref 0–32)
AST: 16 IU/L (ref 0–40)
Alkaline Phosphatase: 120 IU/L — ABNORMAL HIGH (ref 39–117)
BILIRUBIN TOTAL: 0.4 mg/dL (ref 0.0–1.2)
BUN/Creatinine Ratio: 6 — ABNORMAL LOW (ref 9–23)
BUN: 6 mg/dL (ref 6–24)
CO2: 21 mmol/L (ref 20–29)
CREATININE: 0.97 mg/dL (ref 0.57–1.00)
Calcium: 8.7 mg/dL (ref 8.7–10.2)
Chloride: 108 mmol/L — ABNORMAL HIGH (ref 96–106)
GFR, EST AFRICAN AMERICAN: 76 mL/min/{1.73_m2} (ref >59)
GFR, EST NON AFRICAN AMERICAN: 66 mL/min/{1.73_m2} (ref >59)
GLOBULIN, TOTAL: 1.9 g/dL (ref 1.5–4.5)
Glucose: 93 mg/dL (ref 65–99)
Potassium: 3.1 mmol/L — ABNORMAL LOW (ref 3.5–5.2)
Sodium: 144 mmol/L (ref 134–144)
Total Protein: 6.1 g/dL (ref 6.0–8.5)

## 2018-03-20 LAB — VITAMIN D 25 HYDROXY (VIT D DEFICIENCY, FRACTURES): Vit D, 25-Hydroxy: 12.5 ng/mL — ABNORMAL LOW (ref 30.0–100.0)

## 2018-03-20 LAB — HEPATITIS C ANTIBODY (REFLEX)

## 2018-03-20 LAB — HCV COMMENT:

## 2018-03-20 MED ORDER — VITAMIN D (ERGOCALCIFEROL) 1.25 MG (50000 UNIT) PO CAPS: 50000.0000 [IU] | ORAL_CAPSULE | ORAL | 3 refills | Status: AC

## 2018-03-20 MED ORDER — POTASSIUM CHLORIDE ER 20 MEQ PO TBCR: 20.0000 meq | EXTENDED_RELEASE_TABLET | Freq: Once | ORAL | 3 refills | Status: DC

## 2018-03-20 NOTE — Progress Notes (Signed)
Subjective:  Patient ID: [name] [name], female    DOB: [birth year]  Age: 57 y.o. MRN: [id number]  CC: Gynecologic Exam   HPI Kailene Steinhart is a 57 year old female with a history of Fibromyalgia,systemic lupus erythematosus with interstitial lung disease, fibromyalgia, GERD .Her SLE is managed by Rheumatology, Dr. Stann Mainland at [facility] she is followed by nephrology-Dr. Moshe Cipro. She recently had a visit with neurology for right leg weakness and EMG with [facility] revealed no clear evidence of neuropathy. She complains of dark urine and some dysuria and informs me she completed a course of amoxicillin which she received from her nephrologist for treatment of UTI.  She denies flank pain. She has trouble swallowing and dyspepsia and remains on Protonix for her GERD. Also being followed by pain management.  Past Medical History:  Diagnosis Date  . Chronic pain   . COPD (chronic obstructive pulmonary disease) ([place])   . Fibromyalgia   . Migraine   . Migraine without aura, with intractable migraine, so stated, without mention of status migrainosus 05/18/2014  . Pneumonia   . Pulmonary fibrosis ([place])   . Seizures ([place])     Past Surgical History:  Procedure Laterality Date  . ABDOMINAL HYSTERECTOMY    . LUNG BIOPSY Left 11/21/2013   Procedure: LUNG BIOPSY;  Surgeon: Grace Isaac, MD;  Location: [place];  Service: Thoracic;  Laterality: Left;  Marland Kitchen VIDEO ASSISTED THORACOSCOPY Left 11/21/2013   Procedure: VIDEO ASSISTED THORACOSCOPY;  Surgeon: Grace Isaac, MD;  Location: [place];  Service: Thoracic;  Laterality: Left;  Marland Kitchen VIDEO BRONCHOSCOPY N/A 11/21/2013   Procedure: VIDEO BRONCHOSCOPY;  Surgeon: Grace Isaac, MD;  Location: [facility] OR;  Service: Thoracic;  Laterality: N/A;    Allergies  Allergen Reactions  . Shellfish Allergy Anaphylaxis  . Gabapentin     rash     Outpatient Medications Prior to Visit  Medication Sig Dispense Refill  .  atorvastatin (LIPITOR) 40 MG tablet Take 1 tablet (40 mg total) by mouth daily. 90 tablet 3  . Cyanocobalamin (B-12) 2000 MCG TABS Take 2,000 mcg by mouth daily. 30 tablet 3  . etodolac (LODINE) 500 MG tablet Take 1 tablet (500 mg total) by mouth 2 (two) times daily. 30 tablet 0  . fentaNYL (DURAGESIC - DOSED MCG/HR) 50 MCG/HR Place 1 patch (50 mcg total) onto the skin every 3 (three) days. 5 patch 0  . folic acid (FOLVITE) 1 MG tablet Take 1 tablet (1 mg total) by mouth daily. 90 tablet 3  . furosemide (LASIX) 20 MG tablet Take 1 tablet (20 mg total) by mouth daily. 90 tablet 3  . hydroxychloroquine (PLAQUENIL) 200 MG tablet 237m (1 pill) alternating with 4071m(2 pills) every other day    . levETIRAcetam (KEPPRA) 500 MG tablet TAKE 1 TABLET(500 MG) BY MOUTH TWICE DAILY 60 tablet 11  . megestrol (MEGACE) 40 MG/ML suspension SHAKE LIQUID AND TAKE 10 ML(400 MG) BY MOUTH DAILY 480 mL 0  . mycophenolate (CELLCEPT) 500 MG tablet 1,000 mg 2 (two) times daily.  11  . mycophenolate (MYFORTIC) 360 MG TBEC EC tablet Take by mouth.    . Naloxone HCl (EVZIO) 0.4 MG/0.4ML SOAJ as needed.     . OXYGEN Inhale 2 L/min into the lungs at bedtime.    . pantoprazole (PROTONIX) 40 MG tablet Take 1 tablet (40 mg total) by mouth 2 (two) times daily. 60 tablet 11  . polyethylene glycol (MIRALAX / GLYCOLAX) packet Take 17 g by mouth  daily as needed for mild constipation or moderate constipation. 14 each 0  . predniSONE (DELTASONE) 10 MG tablet   3  . pregabalin (LYRICA) 300 MG capsule Take 300 mg by mouth 2 (two) times daily.    . rizatriptan (MAXALT) 10 MG tablet Take 1 tablet (10 mg total) by mouth daily as needed for migraine. May repeat in 2 hours if needed 10 tablet 5  . SYMBICORT 80-4.5 MCG/ACT inhaler Inhale 2 puffs into the lungs 2 (two) times daily.   1  . topiramate (TOPAMAX) 25 MG tablet 5 tablets daily 150 tablet 11  . traMADol (ULTRAM) 50 MG tablet Take 1-2 tablets (50-100 mg total) by mouth every 4 (four)  hours as needed (cough). 84 tablet 0  . potassium chloride (K-DUR) 10 MEQ tablet TAKE 1 TABLET(10 MEQ) BY MOUTH DAILY 30 tablet 0  . Vitamin D, Ergocalciferol, (DRISDOL) 50000 units CAPS capsule Take 1 capsule (50,000 Units total) by mouth every 7 (seven) days. 12 capsule 3   Facility-Administered Medications Prior to Visit  Medication Dose Route Frequency Provider Last Rate Last Dose  . 0.9 %  sodium chloride infusion  500 mL Intravenous Continuous Ladene Artist, MD        ROS Review of Systems  Constitutional: Negative for activity change, appetite change and fatigue.  HENT: Negative for congestion, sinus pressure and sore throat.   Eyes: Negative for visual disturbance.  Respiratory: Negative for cough, chest tightness, shortness of breath and wheezing.   Cardiovascular: Positive for leg swelling. Negative for chest pain and palpitations.  Gastrointestinal: Negative for abdominal distention, abdominal pain and constipation.  Endocrine: Negative for polydipsia.  Genitourinary: Negative for dysuria and frequency.  Musculoskeletal: Positive for gait problem. Negative for arthralgias and back pain.  Skin: Negative for rash.  Neurological: Positive for weakness. Negative for tremors, light-headedness and numbness.  Hematological: Does not bruise/bleed easily.  Psychiatric/Behavioral: Negative for agitation and behavioral problems.    Objective:  BP 96/64   Pulse (!) 53   Temp 97.8 F (36.6 C) (Oral)   Ht _0  (1.549 m)   Wt 110 lb 3.2 oz (50 kg)   SpO2 100%   BMI 20.82 kg/m   BP/Weight 03/19/2018 01/03/2018 0/99/8338  Systolic BP 96 88 250  Diastolic BP 64 62 76  Wt. (Lbs) 110.2 110 108  BMI 20.82 20.78 20.41      Physical Exam  Constitutional: She is oriented to person, place, and time.  Thin  Cardiovascular: Normal heart sounds and intact distal pulses. Tachycardia present.  No murmur heard. Pulmonary/Chest: Effort normal and breath sounds normal. She has no  wheezes. She has no rales. She exhibits no tenderness.  Abdominal: Soft. Bowel sounds are normal. She exhibits no distension and no mass. There is no tenderness.  Musculoskeletal: Normal range of motion. She exhibits edema (2+ b/l pitting pedal edema).  Neurological: She is alert and oriented to person, place, and time.  Skin: Skin is warm and dry.  Psychiatric: She has a normal mood and affect.     Assessment & Plan:   1. Other systemic lupus erythematosus with other organ involvement ([place]) Continue Plaquenil Followed by [facility]  2. Fibromyalgia Stable Followed by pain management  3. Urinary symptom or sign - Urine Culture - CMP14+EGFR - POCT urinalysis dipstick  4. Gastroesophageal reflux disease without esophagitis Doing well on Protonix  5. Vitamin D deficiency Currently on Drisdol - VITAMIN D 25 Hydroxy (Vit-D Deficiency, Fractures) - Hepatitis c antibody (  reflex)  6. Need for hepatitis C screening test Hepatitis C ordered   Meds ordered this encounter  Medications  . DISCONTD: potassium chloride (K-DUR) 10 MEQ tablet    Sig: TAKE 1 TABLET(10 MEQ) BY MOUTH DAILY    Dispense:  30 tablet    Refill:  3    Follow-up: Return in about 3 months (around 06/19/2018) for follow up of chronic medical conditions.   Charlott Rakes MD

## 2018-03-21 LAB — URINE CULTURE

## 2018-04-13 ENCOUNTER — Other Ambulatory Visit: Payer: Self-pay | Admitting: Internal Medicine

## 2018-04-15 MED ORDER — POTASSIUM CHLORIDE ER 20 MEQ PO TBCR: 20.0000 meq | EXTENDED_RELEASE_TABLET | Freq: Every day | ORAL | 3 refills | Status: DC

## 2018-06-17 ENCOUNTER — Telehealth: Payer: Self-pay | Admitting: Family Medicine

## 2018-06-17 ENCOUNTER — Other Ambulatory Visit: Payer: Self-pay | Admitting: Internal Medicine

## 2018-06-17 DIAGNOSIS — M797 Fibromyalgia: Secondary | ICD-10-CM

## 2018-06-17 DIAGNOSIS — G8929 Other chronic pain: Secondary | ICD-10-CM

## 2018-06-17 DIAGNOSIS — R1013 Epigastric pain: Secondary | ICD-10-CM

## 2018-06-17 NOTE — Telephone Encounter (Signed)
1) Medication(s) Requested (by name): Protonix 40 mg 2) Pharmacy of Choice: [facility] on [address] 3) Special Requests:   Approved medications will be sent to the pharmacy, we will reach out if there is an issue.  Requests made after 3pm may not be addressed until the following business day!  If a patient is unsure of the name of the medication(s) please note and ask patient to call back when they are able to provide all info, do not send to responsible party until all information is available!

## 2018-06-17 NOTE — Telephone Encounter (Signed)
RX was sent to her pharmacy earlier this AM.

## 2018-06-19 ENCOUNTER — Ambulatory Visit: Payer: Medicaid Other | Admitting: Family Medicine

## 2018-07-10 ENCOUNTER — Other Ambulatory Visit: Payer: Self-pay | Admitting: Adult Health

## 2018-07-16 ENCOUNTER — Ambulatory Visit: Payer: Medicaid Other | Admitting: Adult Health

## 2018-07-16 ENCOUNTER — Encounter: Payer: Self-pay | Admitting: Adult Health

## 2018-07-16 VITALS — BP 95/67 | HR 66 | Ht 61.0 in | Wt 106.8 lb

## 2018-07-16 DIAGNOSIS — G43009 Migraine without aura, not intractable, without status migrainosus: Secondary | ICD-10-CM

## 2018-07-16 DIAGNOSIS — R569 Unspecified convulsions: Secondary | ICD-10-CM | POA: Diagnosis not present

## 2018-07-16 MED ORDER — TOPIRAMATE 100 MG PO TABS: 150.0000 mg | ORAL_TABLET | Freq: Every day | ORAL | 5 refills | Status: DC

## 2018-07-16 MED ORDER — RIZATRIPTAN BENZOATE 10 MG PO TABS: ORAL_TABLET | ORAL | 5 refills | Status: DC

## 2018-07-16 NOTE — Patient Instructions (Addendum)
Your Plan:  Continue Keppra for seizures Increase Topamax for headaches to 150 mg daily Continue maxalt if needed for headache If your symptoms worsen or you develop new symptoms please let us know.   Thank you for coming to see [place] at [facility]. I hope we have been able to provide you high quality care today.  You may receive a patient satisfaction survey over the next few weeks. We would appreciate your feedback and comments so that we may continue to improve ourselves and the health of our patients.

## 2018-07-16 NOTE — Progress Notes (Signed)
PATIENT: [name] [name] DOB: 1961-02-02  REASON FOR VISIT: follow up HISTORY FROM: patient  HISTORY OF PRESENT ILLNESS: Today 07/16/18:  [name] [name] is a 57 year old female with a history of migraine headaches and seizures.  She returns today for follow-up.  She remains on Keppra.  She denies any seizure events.  She is currently on Topamax 125 mg at bedtime.  She reports that she has 2-3 headaches a month.  Her migraine headaches typically resolve within 1 hour after taking Maxalt.  She sometimes will have a dull headache that typically does not resolve for half a day.  She denies any new neurological symptoms.  She returns today for evaluation.  HISTORY 01/03/18 [name] [name] is a 57 year old female with a history of migraine headaches and seizures.  She returns today for follow-up.  She continues on Keppra.  She denies any seizure events.  She reports that her headaches are under good control with Topamax.  She states that she had 2 headaches a week.  The location can vary.  She reports that she does have photophobia, phonophobia and nausea but denies vomiting.  The patient states that she had an appointment with her rheumatologist in January.  He ordered a x-ray of the neck and nerve conduction study with EMG for right leg weakness.  She states that she did have x-rays but she was unable to go to [facility] for EM[facility].  She is requesting our office to complete this test if possible.  She states that her right leg weakness started within the last 6 months.  She states that it is not gotten worse but has also not improved.  He denies any changes with bowel or bladder.  She had an MRI of the lumbar spine in 2016 however I have not reviewed these results.  She returns today for an evaluation.   REVIEW OF SYSTEMS: Out of a complete 14 system review of symptoms, the patient complains only of the following symptoms, and all other reviewed systems are negative.  Activity change, appetite change, fatigue, light  sensitivity, blurred vision, cough, wheezing, trouble swallowing, leg swelling, cold intolerance, abdominal pain, nausea with joint pain, back pain, aching muscles, muscle cramps, walking difficulty, neck pain, depression  ALLERGIES: Allergies  Allergen Reactions  . Shellfish Allergy Anaphylaxis  . Gabapentin     rash    HOME MEDICATIONS: Outpatient Medications Prior to Visit  Medication Sig Dispense Refill  . atorvastatin (LIPITOR) 40 MG tablet Take 1 tablet (40 mg total) by mouth daily. 90 tablet 3  . Cyanocobalamin (B-12) 2000 MCG TABS Take 2,000 mcg by mouth daily. 30 tablet 3  . etodolac (LODINE) 500 MG tablet Take 1 tablet (500 mg total) by mouth 2 (two) times daily. 30 tablet 0  . fentaNYL (DURAGESIC - DOSED MCG/HR) 50 MCG/HR Place 1 patch (50 mcg total) onto the skin every 3 (three) days. 5 patch 0  . folic acid (FOLVITE) 1 MG tablet Take 1 tablet (1 mg total) by mouth daily. 90 tablet 3  . hydroxychloroquine (PLAQUENIL) 200 MG tablet 200mg  (1 pill) alternating with 400mg  (2 pills) every other day    . levETIRAcetam (KEPPRA) 500 MG tablet TAKE 1 TABLET(500 MG) BY MOUTH TWICE DAILY 60 tablet 11  . mycophenolate (MYFORTIC) 360 MG TBEC EC tablet Take by mouth.    . Naloxone HCl (EVZIO) 0.4 MG/0.4ML SOAJ as needed.     . OXYGEN Inhale 2 L/min into the lungs at bedtime.    . pantoprazole (PROTONIX) 40  MG tablet TAKE 1 TABLET(40 MG) BY MOUTH TWICE DAILY 60 tablet 0  . polyethylene glycol (MIRALAX / GLYCOLAX) packet Take 17 g by mouth daily as needed for mild constipation or moderate constipation. 14 each 0  . Potassium Chloride ER 20 MEQ TBCR Take 20 mEq by mouth daily. 30 tablet 3  . predniSONE (DELTASONE) 10 MG tablet   3  . pregabalin (LYRICA) 300 MG capsule Take 300 mg by mouth 2 (two) times daily.    . rizatriptan (MAXALT) 10 MG tablet Take 1 tablet (10 mg total) by mouth daily as needed for migraine. May repeat in 2 hours if needed 10 tablet 5  . SYMBICORT 80-4.5 MCG/ACT inhaler  Inhale 2 puffs into the lungs 2 (two) times daily.   1  . topiramate (TOPAMAX) 25 MG tablet TAKE 5 TABLETS BY MOUTH DAILY 150 tablet 0  . traMADol (ULTRAM) 50 MG tablet Take 1-2 tablets (50-100 mg total) by mouth every 4 (four) hours as needed (cough). 84 tablet 0  . Vitamin D, Ergocalciferol, (DRISDOL) 50000 units CAPS capsule Take 1 capsule (50,000 Units total) by mouth every 7 (seven) days. 12 capsule 3  . furosemide (LASIX) 20 MG tablet Take 1 tablet (20 mg total) by mouth daily. 90 tablet 3  . megestrol (MEGACE) 40 MG/ML suspension SHAKE LIQUID AND TAKE 10 ML(400 MG) BY MOUTH DAILY (Patient not taking: Reported on 07/16/2018) 480 mL 0  . mycophenolate (CELLCEPT) 500 MG tablet 1,000 mg 2 (two) times daily.  11   Facility-Administered Medications Prior to Visit  Medication Dose Route Frequency Provider Last Rate Last Dose  . 0.9 %  sodium chloride infusion  500 mL Intravenous Continuous Meryl Dare, MD        PAST MEDICAL HISTORY: Past Medical History:  Diagnosis Date  . Chronic pain   . COPD (chronic obstructive pulmonary disease) ([facility])   . Fibromyalgia   . Migraine   . Migraine without aura, with intractable migraine, so stated, without mention of status migrainosus 05/18/2014  . Pneumonia   . Pulmonary fibrosis ([facility])   . Seizures ([facility])     PAST SURGICAL HISTORY: Past Surgical History:  Procedure Laterality Date  . ABDOMINAL HYSTERECTOMY    . LUNG BIOPSY Left 11/21/2013   Procedure: LUNG BIOPSY;  Surgeon: Delight Ovens, MD;  Location: [facility] OR;  Service: Thoracic;  Laterality: Left;  Marland Kitchen VIDEO ASSISTED THORACOSCOPY Left 11/21/2013   Procedure: VIDEO ASSISTED THORACOSCOPY;  Surgeon: Delight Ovens, MD;  Location[facility] OR;  Service: Thoracic;  Laterality: Left;  Marland Kitchen VIDEO BRONCHOSCOPY N/A 11/21/2013   Procedure: VIDEO BRONCHOSCOPY;  Surgeon: Delight Ovens, MD;  Location: [facility] OR;  Service: Thoracic;  Laterality: N/A;    FAMILY HISTORY: Family History  Problem Relation Age of  Onset  . Hypertension Mother   . Hepatitis C Mother   . Diabetes Father   . Hypertension Father   . Bipolar disorder Brother   . Multiple sclerosis Sister     SOCIAL HISTORY: Social History   Socioeconomic History  . Marital status: Single    Spouse name: Not on file  . Number of children: 0  . Years of education: college 3  . Highest education level: Not on file  Occupational History  . Occupation: unemployed  Social Needs  . Financial resource strain: Not on file  . Food insecurity:    Worry: Not on file    Inability: Not on file  . Transportation needs:    Medical:  Not on file    Non-medical: Not on file  Tobacco Use  . Smoking status: Former Smoker    Packs/day: 0.00    Years: 10.00    Pack years: 0.00    Types: Cigarettes    Last attempt to quit: 09/18/2004    Years since quitting: 13.8  . Smokeless tobacco: Never Used  Substance and Sexual Activity  . Alcohol use: No  . Drug use: No  . Sexual activity: Never  Lifestyle  . Physical activity:    Days per week: Not on file    Minutes per session: Not on file  . Stress: Not on file  Relationships  . Social connections:    Talks on phone: Not on file    Gets together: Not on file    Attends religious service: Not on file    Active member of club or organization: Not on file    Attends meetings of clubs or organizations: Not on file    Relationship status: Not on file  . Intimate partner violence:    Fear of current or ex partner: Not on file    Emotionally abused: Not on file    Physically abused: Not on file    Forced sexual activity: Not on file  Other Topics Concern  . Not on file  Social History Narrative  . Not on file      PHYSICAL EXAM  Vitals:   07/16/18 1341  BP: 95/67  Pulse: 66  Weight: 106 lb 12.8 oz (48.4 kg)  Height: 5\' 1"  (1.549 m)   Body mass index is 20.18 kg/m.  Generalized: Well developed, in no acute distress   Neurological examination  Mentation: Alert oriented to  time, place, history taking. Follows all commands speech and language fluent Cranial nerve II-XII: Pupils were equal round reactive to light. Extraocular movements were full, visual field were full on confrontational test. Facial sensation and strength were normal. Uvula tongue midline. Head turning and shoulder shrug  were normal and symmetric. Motor: The motor testing reveals 5 over 5 strength of all 4 extremities. Good symmetric motor tone is noted throughout.  Sensory: Sensory testing is intact to soft touch on all 4 extremities. No evidence of extinction is noted.  Coordination: Cerebellar testing reveals good finger-nose-finger and heel-to-shin bilaterally.  Gait and station: Patient uses a walker when ambulating.  Tandem gait not attempted. Reflexes: Deep tendon reflexes are symmetric and normal bilaterally.   DIAGNOSTIC DATA (LABS, IMAGING, TESTING) - I reviewed patient records, labs, notes, testing and imaging myself where available.  Lab Results  Component Value Date   WBC 22.0 (HH) 10/11/2017   HGB 12.9 10/11/2017   HCT 42.9 10/11/2017   MCV 84 10/11/2017   PLT 173 10/11/2017      Component Value Date/Time   NA 144 03/19/2018 1719   K 3.1 (L) 03/19/2018 1719   CL 108 (H) 03/19/2018 1719   CO2 21 03/19/2018 1719   GLUCOSE 93 03/19/2018 1719   GLUCOSE 73 09/27/2016 1006   BUN 6 03/19/2018 1719   CREATININE 0.97 03/19/2018 1719   CREATININE 1.27 (H) 09/27/2016 1006   CALCIUM 8.7 03/19/2018 1719   PROT 6.1 03/19/2018 1719   ALBUMIN 4.2 03/19/2018 1719   AST 16 03/19/2018 1719   ALT 6 03/19/2018 1719   ALKPHOS 120 (H) 03/19/2018 1719   BILITOT 0.4 03/19/2018 1719   GFRNONAA 66 03/19/2018 1719   GFRNONAA 48 (L) 09/27/2016 1006   GFRAA 76 03/19/2018 1719  GFRAA 55 (L) 09/27/2016 1006   Lab Results  Component Value Date   CHOL 191 05/23/2017   HDL 46 05/23/2017   LDLCALC 122 (H) 05/23/2017   TRIG 113 05/23/2017   CHOLHDL 4.2 05/23/2017   Lab Results  Component  Value Date   HGBA1C 5.6 09/10/2014   Lab Results  Component Value Date   VITAMINB12 231 10/07/2013   Lab Results  Component Value Date   TSH 0.972 09/10/2014      ASSESSMENT AND PLAN 57 y.o. year old female  has a past medical history of Chronic pain, COPD (chronic obstructive pulmonary disease) ([facility]), Fibromyalgia, Migraine, Migraine without aura, with intractable migraine, so stated, without mention of status migrainosus (05/18/2014), Pneumonia, Pulmonary fibrosis ([facility]), and Seizures ([facility]). here with :  1.  Migraine headaches 2.  Seizures  Overall the patient has remained stable.  She will continue on Keppra for seizures.  I will increase Topamax to 150 mg daily to see if this gives her better benefit with her headaches.  She will continue Maxalt as needed.  She is advised that if her symptoms worsen or she develops new symptoms she should let us know.  She will follow-up in 6 months or sooner if needed.  Butch Penny, MSN, NP-C 07/16/2018, 1:51 PM [facility] [address], [address] [place], [place] [address] [phone]

## 2018-07-16 NOTE — Progress Notes (Signed)
I have read the note, and I agree with the clinical assessment and plan.  Tamyrah Burbage K Desire Fulp   

## 2018-08-06 ENCOUNTER — Ambulatory Visit: Payer: Medicaid Other | Attending: Family Medicine | Admitting: Family Medicine

## 2018-08-06 ENCOUNTER — Encounter: Payer: Self-pay | Admitting: Family Medicine

## 2018-08-06 VITALS — BP 117/78 | HR 52 | Temp 97.4°F | Ht 61.0 in | Wt 103.8 lb

## 2018-08-06 DIAGNOSIS — Z888 Allergy status to other drugs, medicaments and biological substances status: Secondary | ICD-10-CM | POA: Insufficient documentation

## 2018-08-06 DIAGNOSIS — J8410 Pulmonary fibrosis, unspecified: Secondary | ICD-10-CM | POA: Insufficient documentation

## 2018-08-06 DIAGNOSIS — G8929 Other chronic pain: Secondary | ICD-10-CM | POA: Diagnosis not present

## 2018-08-06 DIAGNOSIS — M329 Systemic lupus erythematosus, unspecified: Secondary | ICD-10-CM | POA: Insufficient documentation

## 2018-08-06 DIAGNOSIS — Z79899 Other long term (current) drug therapy: Secondary | ICD-10-CM | POA: Insufficient documentation

## 2018-08-06 DIAGNOSIS — M3219 Other organ or system involvement in systemic lupus erythematosus: Secondary | ICD-10-CM

## 2018-08-06 DIAGNOSIS — G43909 Migraine, unspecified, not intractable, without status migrainosus: Secondary | ICD-10-CM | POA: Diagnosis not present

## 2018-08-06 DIAGNOSIS — J449 Chronic obstructive pulmonary disease, unspecified: Secondary | ICD-10-CM | POA: Insufficient documentation

## 2018-08-06 DIAGNOSIS — R1013 Epigastric pain: Secondary | ICD-10-CM | POA: Diagnosis not present

## 2018-08-06 DIAGNOSIS — R569 Unspecified convulsions: Secondary | ICD-10-CM

## 2018-08-06 DIAGNOSIS — J849 Interstitial pulmonary disease, unspecified: Secondary | ICD-10-CM | POA: Diagnosis not present

## 2018-08-06 DIAGNOSIS — M797 Fibromyalgia: Secondary | ICD-10-CM | POA: Diagnosis not present

## 2018-08-06 DIAGNOSIS — E785 Hyperlipidemia, unspecified: Secondary | ICD-10-CM | POA: Diagnosis not present

## 2018-08-06 DIAGNOSIS — I5032 Chronic diastolic (congestive) heart failure: Secondary | ICD-10-CM

## 2018-08-06 DIAGNOSIS — K219 Gastro-esophageal reflux disease without esophagitis: Secondary | ICD-10-CM | POA: Insufficient documentation

## 2018-08-06 MED ORDER — ATORVASTATIN CALCIUM 40 MG PO TABS: 40.0000 mg | ORAL_TABLET | Freq: Every day | ORAL | 6 refills | Status: DC

## 2018-08-06 MED ORDER — PANTOPRAZOLE SODIUM 40 MG PO TBEC: 40.0000 mg | DELAYED_RELEASE_TABLET | Freq: Two times a day (BID) | ORAL | 6 refills | Status: DC

## 2018-08-06 MED ORDER — POTASSIUM CHLORIDE ER 20 MEQ PO TBCR: 20.0000 meq | EXTENDED_RELEASE_TABLET | Freq: Every day | ORAL | 6 refills | Status: DC

## 2018-08-07 NOTE — Progress Notes (Signed)
Subjective:  Patient ID: [name] [name], female    DOB: 06-Apr-1961  Age: 57 y.o. MRN: [id number]  CC: Fibromyalgia   HPI [name] [name] is a 57 year old female with a history of Fibromyalgia,systemic lupus erythematosus with interstitial lung disease, fibromyalgia, GERD, seizures, migraines.Her SLE is managed by Rheumatology, Dr. Aundria Rud at [facility] she is followed by nephrology-Dr. Kathrene Bongo.  With regards to her lupus she is currently on Plaquenil, Myfortic and CellCept and her last eye exam was earlier this year. She was recently seen by GI at [facility] 1 week ago due to persisting epigastric pain and had ranitidine added to her Protonix regimen with plans for upper endoscopy. Her interstitial lung disease is managed by pulmonary-Dr. Marisa Sprinkles. Due to her frequent UTIs and history of kidney stones she was scheduled an appointment with urology at [facility] and this comes up next month.  Overall she feels the same, denies urinary symptoms epigastric pain is still present and she despite taking ranitidine and Protonix.  She denies nausea, vomiting, diarrhea constipation. Also sees pain management. She has not had any recent seizures and has been compliant with Topamax and Keppra and her migraines have also been controlled. Labs from [facility] reviewed from care everywhere.  Last potassium was 3.4 and she remains on potassium replacement.  Past Medical History:  Diagnosis Date  . Chronic pain   . COPD (chronic obstructive pulmonary disease) (HCC)   . Fibromyalgia   . Migraine   . Migraine without aura, with intractable migraine, so stated, without mention of status migrainosus 05/18/2014  . Pneumonia   . Pulmonary fibrosis (HCC)   . Seizures (HCC)     Past Surgical History:  Procedure Laterality Date  . ABDOMINAL HYSTERECTOMY    . LUNG BIOPSY Left 11/21/2013   Procedure: LUNG BIOPSY;  Surgeon: Delight Ovens, MD;  Location: [facility] [place] OR;  Service: Thoracic;  Laterality: Left;  Marland Kitchen VIDEO ASSISTED  THORACOSCOPY Left 11/21/2013   Procedure: VIDEO ASSISTED THORACOSCOPY;  Surgeon: Delight Ovens, MD;  Location: [facility] OR;  Service: Thoracic;  Laterality: Left;  Marland Kitchen VIDEO BRONCHOSCOPY N/A 11/21/2013   Procedure: VIDEO BRONCHOSCOPY;  Surgeon: Delight Ovens, MD;  Location: [facility] OR;  Service: Thoracic;  Laterality: N/A;    Allergies  Allergen Reactions  . Shellfish Allergy Anaphylaxis  . Gabapentin     rash  . Pregabalin Swelling    Able to tolerate Lyrica (brand name only)     Outpatient Medications Prior to Visit  Medication Sig Dispense Refill  . Cyanocobalamin (B-12) 2000 MCG TABS Take 2,000 mcg by mouth daily. 30 tablet 3  . etodolac (LODINE) 500 MG tablet Take 1 tablet (500 mg total) by mouth 2 (two) times daily. 30 tablet 0  . fentaNYL (DURAGESIC - DOSED MCG/HR) 50 MCG/HR Place 1 patch (50 mcg total) onto the skin every 3 (three) days. 5 patch 0  . folic acid (FOLVITE) 1 MG tablet Take 1 tablet (1 mg total) by mouth daily. 90 tablet 3  . hydroxychloroquine (PLAQUENIL) 200 MG tablet 200mg  (1 pill) alternating with 400mg  (2 pills) every other day    . levETIRAcetam (KEPPRA) 500 MG tablet TAKE 1 TABLET(500 MG) BY MOUTH TWICE DAILY 60 tablet 11  . mycophenolate (CELLCEPT) 500 MG tablet 1,000 mg 2 (two) times daily.  11  . Naloxone HCl (EVZIO) 0.4 MG/0.4ML SOAJ as needed.     . OXYGEN Inhale 2 L/min into the lungs at bedtime.    . polyethylene glycol (MIRALAX / GLYCOLAX) packet Take  17 g by mouth daily as needed for mild constipation or moderate constipation. 14 each 0  . predniSONE (DELTASONE) 10 MG tablet   3  . pregabalin (LYRICA) 300 MG capsule Take 300 mg by mouth 2 (two) times daily.    . rizatriptan (MAXALT) 10 MG tablet Take 1 tab at the onset of migraine.May repeat in 2 hours if needed. Not to exceed 2tabs/24 hours 10 tablet 5  . SYMBICORT 80-4.5 MCG/ACT inhaler Inhale 2 puffs into the lungs 2 (two) times daily.   1  . topiramate (TOPAMAX) 100 MG tablet Take 1.5 tablets (150 mg  total) by mouth daily. 45 tablet 5  . traMADol (ULTRAM) 50 MG tablet Take 1-2 tablets (50-100 mg total) by mouth every 4 (four) hours as needed (cough). 84 tablet 0  . Vitamin D, Ergocalciferol, (DRISDOL) 50000 units CAPS capsule Take 1 capsule (50,000 Units total) by mouth every 7 (seven) days. 12 capsule 3  . atorvastatin (LIPITOR) 40 MG tablet Take 1 tablet (40 mg total) by mouth daily. 90 tablet 3  . pantoprazole (PROTONIX) 40 MG tablet TAKE 1 TABLET(40 MG) BY MOUTH TWICE DAILY 60 tablet 0  . Potassium Chloride ER 20 MEQ TBCR Take 20 mEq by mouth daily. 30 tablet 3  . furosemide (LASIX) 20 MG tablet Take 1 tablet (20 mg total) by mouth daily. 90 tablet 3  . megestrol (MEGACE) 40 MG/ML suspension SHAKE LIQUID AND TAKE 10 ML(400 MG) BY MOUTH DAILY (Patient not taking: Reported on 07/16/2018) 480 mL 0   Facility-Administered Medications Prior to Visit  Medication Dose Route Frequency Provider Last Rate Last Dose  . 0.9 %  sodium chloride infusion  500 mL Intravenous Continuous Meryl DareStark, Malcolm T, MD        ROS Review of Systems  Constitutional: Negative for activity change, appetite change and fatigue.  HENT: Negative for congestion, sinus pressure and sore throat.   Eyes: Negative for visual disturbance.  Respiratory: Negative for cough, chest tightness, shortness of breath and wheezing.   Cardiovascular: Negative for chest pain and palpitations.  Gastrointestinal: Positive for abdominal pain. Negative for abdominal distention and constipation.  Endocrine: Negative for polydipsia.  Genitourinary: Negative for dysuria and frequency.  Musculoskeletal: Positive for arthralgias. Negative for back pain.  Skin: Negative for rash.  Neurological: Negative for tremors, light-headedness and numbness.  Hematological: Does not bruise/bleed easily.  Psychiatric/Behavioral: Negative for agitation and behavioral problems.    Objective:  BP 117/78   Pulse (!) 52   Temp (!) 97.4 F (36.3 C) (Oral)    Ht 5\' 1"  (1.549 m)   Wt 103 lb 12.8 oz (47.1 kg)   SpO2 100%   BMI 19.61 kg/m   BP/Weight 08/06/2018 07/16/2018 03/19/2018  Systolic BP 117 95 96  Diastolic BP 78 67 64  Wt. (Lbs) 103.8 106.8 110.2  BMI 19.61 20.18 20.82      Physical Exam  Constitutional: She is oriented to person, place, and time. She appears well-developed and well-nourished.  Chronically ill looking  Cardiovascular: Normal rate, normal heart sounds and intact distal pulses.  No murmur heard. Pulmonary/Chest: Effort normal and breath sounds normal. She has no wheezes. She has no rales. She exhibits no tenderness.  Abdominal: Soft. Bowel sounds are normal. She exhibits no distension and no mass. There is no tenderness.  Musculoskeletal: Normal range of motion.  1+ bilateral nonpitting pedal edema up to the ankles  Neurological: She is alert and oriented to person, place, and time.  Skin: Skin is  warm and dry.  Psychiatric: She has a normal mood and affect.     Assessment & Plan:   1. Fibromyalgia Currently under the care of pain management She is on Lyrica but informs me she is unable to tolerate the brand name as she has had allergic reactions to the generic in the form of edema of her lip and throat  2. Abdominal pain, chronic, epigastric Secondary to GERD Continue ranitidine and pantoprazole Keep appointment for upcoming upper endoscopy Followed by [facility] - pantoprazole (PROTONIX) 40 MG tablet; Take 1 tablet (40 mg total) by mouth 2 (two) times daily.  Dispense: 60 tablet; Refill: 6  3. Dyslipidemia Stable - Lipid panel; Future - atorvastatin (LIPITOR) 40 MG tablet; Take 1 tablet (40 mg total) by mouth daily.  Dispense: 30 tablet; Refill: 6  4. Seizures (HCC) No recent seizures Continue Topamax and Keppra  5. Other systemic lupus erythematosus with other organ involvement (HCC) Currently on CellCept, Myfortic and Plaquenil Management as per rheumatology  6. Interstitial lung disease  (HCC) Continue CellCept, Myfortic and Plaquenil Followed by pulmonary   Meds ordered this encounter  Medications  . pantoprazole (PROTONIX) 40 MG tablet    Sig: Take 1 tablet (40 mg total) by mouth 2 (two) times daily.    Dispense:  60 tablet    Refill:  6  . Potassium Chloride ER 20 MEQ TBCR    Sig: Take 20 mEq by mouth daily.    Dispense:  30 tablet    Refill:  6  . atorvastatin (LIPITOR) 40 MG tablet    Sig: Take 1 tablet (40 mg total) by mouth daily.    Dispense:  30 tablet    Refill:  6    Follow-up: Return in about 6 months (around 02/04/2019) for follow up of chronic medical conditions.   Hoy Register MD

## 2018-08-08 NOTE — Addendum Note (Signed)
Addended by: Paschal DoppWHITE, Rober Skeels J on: 08/08/2018 11:22 AM   Modules accepted: Orders

## 2018-08-08 NOTE — Addendum Note (Signed)
Addended by: Paschal DoppWHITE, Carlon Davidson J on: 08/08/2018 11:42 AM   Modules accepted: Orders

## 2018-08-08 NOTE — Addendum Note (Signed)
Addended by: Paschal DoppWHITE, VANESSA J on: 08/08/2018 11:20 AM   Modules accepted: Orders

## 2018-08-09 LAB — LIPID PANEL
CHOL/HDL RATIO: 3.5 ratio (ref 0.0–4.4)
CHOLESTEROL TOTAL: 181 mg/dL (ref 100–199)
HDL: 52 mg/dL (ref >39)
LDL CALC: 110 mg/dL — AB (ref 0–99)
Triglycerides: 94 mg/dL (ref 0–149)
VLDL CHOLESTEROL CAL: 19 mg/dL (ref 5–40)

## 2018-08-10 ENCOUNTER — Other Ambulatory Visit: Payer: Self-pay | Admitting: Family Medicine

## 2018-08-10 DIAGNOSIS — G8929 Other chronic pain: Secondary | ICD-10-CM

## 2018-08-10 DIAGNOSIS — R1013 Epigastric pain: Secondary | ICD-10-CM

## 2018-08-10 DIAGNOSIS — M797 Fibromyalgia: Secondary | ICD-10-CM

## 2018-08-10 LAB — SPECIMEN STATUS REPORT

## 2018-08-10 LAB — POTASSIUM: POTASSIUM: 3.7 mmol/L (ref 3.5–5.2)

## 2018-08-13 ENCOUNTER — Other Ambulatory Visit: Payer: Self-pay | Admitting: Internal Medicine

## 2018-08-13 DIAGNOSIS — J8489 Other specified interstitial pulmonary diseases: Secondary | ICD-10-CM

## 2018-08-14 ENCOUNTER — Telehealth: Payer: Self-pay

## 2018-08-14 NOTE — Telephone Encounter (Signed)
Patient was called and informed of lab results. 

## 2018-08-14 NOTE — Telephone Encounter (Signed)
-----   Message from Hoy RegisterEnobong Newlin, MD sent at 08/09/2018 10:43 AM EST ----- Please inform the patient that labs are normal. Thank you.

## 2018-08-14 NOTE — Telephone Encounter (Signed)
-----   Message from Enobong Newlin, MD sent at 08/09/2018 10:43 AM EST ----- Please inform the patient that labs are normal. Thank you. 

## 2018-09-29 ENCOUNTER — Emergency Department (HOSPITAL_COMMUNITY): Payer: Medicaid Other

## 2018-09-29 ENCOUNTER — Inpatient Hospital Stay (HOSPITAL_COMMUNITY)
Admission: EM | Admit: 2018-09-29 | Discharge: 2018-10-05 | DRG: 854 | Disposition: A | Discharge status: Home Health | Payer: Medicaid Other | Attending: Internal Medicine | Admitting: Internal Medicine

## 2018-09-29 ENCOUNTER — Other Ambulatory Visit: Payer: Self-pay

## 2018-09-29 ENCOUNTER — Encounter (HOSPITAL_COMMUNITY): Payer: Self-pay

## 2018-09-29 DIAGNOSIS — J849 Interstitial pulmonary disease, unspecified: Secondary | ICD-10-CM | POA: Diagnosis present

## 2018-09-29 DIAGNOSIS — Z87442 Personal history of urinary calculi: Secondary | ICD-10-CM | POA: Diagnosis present

## 2018-09-29 DIAGNOSIS — N12 Tubulo-interstitial nephritis, not specified as acute or chronic: Secondary | ICD-10-CM

## 2018-09-29 DIAGNOSIS — K219 Gastro-esophageal reflux disease without esophagitis: Secondary | ICD-10-CM | POA: Diagnosis present

## 2018-09-29 DIAGNOSIS — Z7952 Long term (current) use of systemic steroids: Secondary | ICD-10-CM

## 2018-09-29 DIAGNOSIS — I5032 Chronic diastolic (congestive) heart failure: Secondary | ICD-10-CM | POA: Diagnosis present

## 2018-09-29 DIAGNOSIS — Z8249 Family history of ischemic heart disease and other diseases of the circulatory system: Secondary | ICD-10-CM

## 2018-09-29 DIAGNOSIS — Z833 Family history of diabetes mellitus: Secondary | ICD-10-CM

## 2018-09-29 DIAGNOSIS — Z820 Family history of epilepsy and other diseases of the nervous system: Secondary | ICD-10-CM

## 2018-09-29 DIAGNOSIS — E785 Hyperlipidemia, unspecified: Secondary | ICD-10-CM | POA: Diagnosis present

## 2018-09-29 DIAGNOSIS — Z79899 Other long term (current) drug therapy: Secondary | ICD-10-CM

## 2018-09-29 DIAGNOSIS — Z87440 Personal history of urinary (tract) infections: Secondary | ICD-10-CM

## 2018-09-29 DIAGNOSIS — G40909 Epilepsy, unspecified, not intractable, without status epilepticus: Secondary | ICD-10-CM

## 2018-09-29 DIAGNOSIS — A419 Sepsis, unspecified organism: Secondary | ICD-10-CM | POA: Diagnosis present

## 2018-09-29 DIAGNOSIS — N132 Hydronephrosis with renal and ureteral calculous obstruction: Secondary | ICD-10-CM | POA: Diagnosis present

## 2018-09-29 DIAGNOSIS — Z7982 Long term (current) use of aspirin: Secondary | ICD-10-CM

## 2018-09-29 DIAGNOSIS — I9589 Other hypotension: Secondary | ICD-10-CM | POA: Diagnosis present

## 2018-09-29 DIAGNOSIS — N136 Pyonephrosis: Secondary | ICD-10-CM | POA: Diagnosis present

## 2018-09-29 DIAGNOSIS — Z7951 Long term (current) use of inhaled steroids: Secondary | ICD-10-CM

## 2018-09-29 DIAGNOSIS — M329 Systemic lupus erythematosus, unspecified: Secondary | ICD-10-CM | POA: Diagnosis present

## 2018-09-29 DIAGNOSIS — J449 Chronic obstructive pulmonary disease, unspecified: Secondary | ICD-10-CM | POA: Diagnosis present

## 2018-09-29 DIAGNOSIS — J8410 Pulmonary fibrosis, unspecified: Secondary | ICD-10-CM | POA: Diagnosis present

## 2018-09-29 DIAGNOSIS — J8489 Other specified interstitial pulmonary diseases: Secondary | ICD-10-CM | POA: Diagnosis present

## 2018-09-29 DIAGNOSIS — G8929 Other chronic pain: Secondary | ICD-10-CM | POA: Diagnosis present

## 2018-09-29 DIAGNOSIS — N10 Acute tubulo-interstitial nephritis: Secondary | ICD-10-CM | POA: Diagnosis present

## 2018-09-29 DIAGNOSIS — Z91013 Allergy to seafood: Secondary | ICD-10-CM

## 2018-09-29 DIAGNOSIS — Z90710 Acquired absence of both cervix and uterus: Secondary | ICD-10-CM

## 2018-09-29 DIAGNOSIS — Z818 Family history of other mental and behavioral disorders: Secondary | ICD-10-CM

## 2018-09-29 DIAGNOSIS — Z87891 Personal history of nicotine dependence: Secondary | ICD-10-CM

## 2018-09-29 DIAGNOSIS — Z888 Allergy status to other drugs, medicaments and biological substances status: Secondary | ICD-10-CM

## 2018-09-29 DIAGNOSIS — E559 Vitamin D deficiency, unspecified: Secondary | ICD-10-CM | POA: Diagnosis present

## 2018-09-29 DIAGNOSIS — I130 Hypertensive heart and chronic kidney disease with heart failure and stage 1 through stage 4 chronic kidney disease, or unspecified chronic kidney disease: Secondary | ICD-10-CM | POA: Diagnosis present

## 2018-09-29 DIAGNOSIS — M797 Fibromyalgia: Secondary | ICD-10-CM | POA: Diagnosis present

## 2018-09-29 DIAGNOSIS — R509 Fever, unspecified: Secondary | ICD-10-CM

## 2018-09-29 DIAGNOSIS — G43909 Migraine, unspecified, not intractable, without status migrainosus: Secondary | ICD-10-CM | POA: Diagnosis present

## 2018-09-29 DIAGNOSIS — N189 Chronic kidney disease, unspecified: Secondary | ICD-10-CM | POA: Diagnosis present

## 2018-09-29 HISTORY — DX: Heart failure, unspecified: I50.9

## 2018-09-29 HISTORY — DX: Chronic kidney disease, unspecified: N18.9

## 2018-09-29 HISTORY — DX: Personal history of urinary calculi: Z87.442

## 2018-09-29 HISTORY — DX: Peripheral vascular disease, unspecified: I73.9

## 2018-09-29 HISTORY — DX: Systemic lupus erythematosus, unspecified: M32.9

## 2018-09-29 HISTORY — DX: Urinary tract infection, site not specified: N39.0

## 2018-09-29 LAB — CBC WITH DIFFERENTIAL/PLATELET
Abs Immature Granulocytes: 0.04 10*3/uL (ref 0.00–0.07)
BASOS PCT: 0 %
Basophils Absolute: 0 10*3/uL (ref 0.0–0.1)
Eosinophils Absolute: 0.2 10*3/uL (ref 0.0–0.5)
Eosinophils Relative: 2 %
HCT: 39.1 % (ref 36.0–46.0)
Hemoglobin: 11.5 g/dL — ABNORMAL LOW (ref 12.0–15.0)
Immature Granulocytes: 0 %
Lymphocytes Relative: 4 %
Lymphs Abs: 0.3 10*3/uL — ABNORMAL LOW (ref 0.7–4.0)
MCH: 24.8 pg — ABNORMAL LOW (ref 26.0–34.0)
MCHC: 29.4 g/dL — ABNORMAL LOW (ref 30.0–36.0)
MCV: 84.4 fL (ref 80.0–100.0)
MONO ABS: 0.6 10*3/uL (ref 0.1–1.0)
Monocytes Relative: 7 %
NEUTROS ABS: 8 10*3/uL — AB (ref 1.7–7.7)
Neutrophils Relative %: 87 %
Platelets: 153 10*3/uL (ref 150–400)
RBC: 4.63 MIL/uL (ref 3.87–5.11)
RDW: 15.4 % (ref 11.5–15.5)
WBC: 9.1 10*3/uL (ref 4.0–10.5)
nRBC: 0 % (ref 0.0–0.2)

## 2018-09-29 LAB — URINALYSIS, ROUTINE W REFLEX MICROSCOPIC
Bacteria, UA: NONE SEEN
Bilirubin Urine: NEGATIVE
Glucose, UA: NEGATIVE mg/dL
Ketones, ur: NEGATIVE mg/dL
Nitrite: NEGATIVE
Protein, ur: 30 mg/dL — AB
Specific Gravity, Urine: 1.014 (ref 1.005–1.030)
WBC, UA: >50 WBC/hpf — ABNORMAL HIGH (ref 0–5)
pH: 7 (ref 5.0–8.0)

## 2018-09-29 LAB — COMPREHENSIVE METABOLIC PANEL
ALT: 8 U/L (ref 0–44)
AST: 17 U/L (ref 15–41)
Albumin: 3.6 g/dL (ref 3.5–5.0)
Alkaline Phosphatase: 97 U/L (ref 38–126)
Anion gap: 9 (ref 5–15)
BUN: 15 mg/dL (ref 6–20)
CO2: 19 mmol/L — ABNORMAL LOW (ref 22–32)
CREATININE: 1.02 mg/dL — AB (ref 0.44–1.00)
Calcium: 8.3 mg/dL — ABNORMAL LOW (ref 8.9–10.3)
Chloride: 112 mmol/L — ABNORMAL HIGH (ref 98–111)
GFR calc non Af Amer: >60 mL/min (ref >60)
Glucose, Bld: 128 mg/dL — ABNORMAL HIGH (ref 70–99)
Potassium: 3.3 mmol/L — ABNORMAL LOW (ref 3.5–5.1)
Sodium: 140 mmol/L (ref 135–145)
Total Bilirubin: 0.5 mg/dL (ref 0.3–1.2)
Total Protein: 6.1 g/dL — ABNORMAL LOW (ref 6.5–8.1)

## 2018-09-29 LAB — LIPASE, BLOOD: Lipase: 25 U/L (ref 11–51)

## 2018-09-29 LAB — TROPONIN I

## 2018-09-29 MED ORDER — SODIUM CHLORIDE 0.9 % IV SOLN
1.0000 g | Freq: Once | INTRAVENOUS | Status: AC
  Administered 2018-09-29: 1 g via INTRAVENOUS
  Filled 2018-09-29: qty 10

## 2018-09-29 MED ORDER — SODIUM CHLORIDE 0.9 % IV BOLUS
1000.0000 mL | Freq: Once | INTRAVENOUS | Status: AC
  Administered 2018-09-29: 1000 mL via INTRAVENOUS

## 2018-09-29 MED ORDER — FENTANYL CITRATE (PF) 100 MCG/2ML IJ SOLN
50.0000 ug | Freq: Once | INTRAMUSCULAR | Status: AC
  Administered 2018-09-29: 50 ug via INTRAVENOUS
  Filled 2018-09-29: qty 2

## 2018-09-29 NOTE — ED Notes (Signed)
Bed: BW38 Expected date:  Expected time:  Means of arrival:  Comments: fever

## 2018-09-29 NOTE — ED Notes (Signed)
Urine Culture tube sent with UA

## 2018-09-29 NOTE — ED Triage Notes (Signed)
Patient presented to ed with c/o generalized weakness and fever x 3 days. Patient was seen at [facility] for cardiac procedure but they never did it due to hypotension and fever. Patient state she been running a fever for about a week but fever progressive got worse in the last 3 days.

## 2018-09-29 NOTE — ED Provider Notes (Signed)
[facility]-EMERGENCY DEPT Provider Note   CSN: [id number] Arrival date & time: 09/29/18  1800     History   Chief Complaint No chief complaint on file.   HPI [name] [name] is a 58 y.o. female.  Fever, general malaise, back pain, dysuria, nausea, cough for 1 week, worse the past several days.  [facility] fibrosis, COPD, fibromyalgia, chronic pain.  Severity of symptoms is moderate.  Nothing makes sxs better or worse     Past Medical History:  Diagnosis Date  . Chronic pain   . COPD (chronic obstructive pulmonary disease) (HCC)   . Fibromyalgia   . Migraine   . Migraine without aura, with intractable migraine, so stated, without mention of status migrainosus 05/18/2014  . Pneumonia   . Pulmonary fibrosis (HCC)   . Seizures [facility](HCC)     Patient Active Problem List   Diagnosis Date Noted  . Vitamin D deficiency 03/19/2018  . SLE (systemic lupus erythematosus) (HCC) 12/12/2017  . Poor appetite 05/23/2017  . Blurry vision, bilateral 06/08/2016  . Abdominal pain, chronic, epigastric 02/17/2016  . Other type of migraine 06/28/2015  . Colon cancer screening 06/28/2015  . Encounter for screening mammogram for breast cancer 06/28/2015  . Dyslipidemia 06/28/2015  . Chronic kidney disease 12/28/2014  . Midline low back pain without sciatica 12/14/2014  . Dental caries 12/14/2014  . Numbness and tingling 12/14/2014  . Gastroesophageal reflux disease without esophagitis 12/14/2014  . Headache due to trauma 08/24/2014  . Seizures (HCC) 06/02/2014  . Chronic diastolic heart failure (HCC) 05/30/2014  . 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 Common migraine with intractable migraine 05/18/2014  . Headache(784.0) 04/28/2014  . Nocturnal hypoxemia 03/16/2014  . Dyspnea 01/06/2014  . Post-op pain 11/21/2013  . Protein-calorie malnutrition, severe (HCC) 11/19/2013  . Enteritis due to Clostridium difficile  11/18/2013  . Interstitial lung disease (HCC) 11/02/2013  . 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 Altered mental status 10/22/2013  . Anemia of chronic disease 10/15/2013  . Hematuria 10/15/2013  . History of renal calculi, multiple 10/15/2013  . Abdominal pain, epigastric 10/15/2013  . Nausea 10/15/2013  . Severe sepsis(995.92) 10/15/2013  . Seizure disorder (HCC) 10/14/2013  . Fibromyalgia 10/14/2013  . Sepsis (HCC) 10/05/2013  . Chronic pain 10/05/2013  . Leukocytosis 10/05/2013    Past Surgical History:  Procedure Laterality Date  . ABDOMINAL HYSTERECTOMY    . LUNG BIOPSY Left 11/21/2013   Procedure: LUNG BIOPSY;  Surgeon: Delight OvensEdward B Gerhardt, MD;  [facility];  Service: Thoracic;  Laterality: Left;  Marland Kitchen. VIDEO ASSISTED THORACOSCOPY Left 11/21/2013   Procedure: VIDEO ASSISTED THORACOSCOPY;  Surgeon: Delight OvensEdward B Gerhardt, MD;  [facility];  Service: Thoracic;  Laterality: Left;  Marland Kitchen. VIDEO BRONCHOSCOPY N/A 11/21/2013   Procedure: VIDEO BRONCHOSCOPY;  Surgeon: Delight OvensEdward B Gerhardt, MD;  [facility];  Service: Thoracic;  Laterality: N/A;     OB History   No obstetric history on file.      Home Medications    Prior to Admission medications   Medication Sig Start Date End Date Taking? Authorizing Provider  aspirin EC 81 MG tablet Take 81 mg by mouth daily. 09/09/18 09/09/19 Yes [provider]  atorvastatin (LIPITOR) 40 MG tablet Take 1 tablet (40 mg total) by mouth daily. 08/06/18  Yes Hoy RegisterNewlin, Enobong, MD  Cyanocobalamin (B-12) 2000 MCG TABS Take 2,000 mcg by mouth daily. 08/29/17  Yes Quentin AngstJegede, Olugbemiga E, MD  etodolac (LODINE) 500 MG tablet Take 1 tablet (500 mg total) by mouth 2 (two) times daily. 04/28/14  Yes Nyoka Cowden, MD  fentaNYL (DURAGESIC - DOSED MCG/HR) 50 MCG/HR Place 1 patch (50 mcg total) onto the skin every 3 (three) days. 04/28/14  Yes Nyoka Cowden, MD  folic acid (FOLVITE) 1 MG tablet Take 1 tablet (1 mg  total) by mouth daily. 08/29/17  Yes Quentin Angst, MD  furosemide (LASIX) 20 MG tablet TAKE 1 TABLET(20 MG) BY MOUTH DAILY 08/13/18  Yes Hoy Register, MD  hydroxychloroquine (PLAQUENIL) 200 MG tablet 200mg  (1 pill) alternating with 400mg  (2 pills) every other day 10/29/17  Yes [provider]  levETIRAcetam (KEPPRA) 500 MG tablet TAKE 1 TABLET(500 MG) BY MOUTH TWICE DAILY 01/03/18  Yes Butch Penny, NP  mycophenolate (CELLCEPT) 500 MG tablet 1,000 mg 2 (two) times daily. 06/26/17  Yes [provider]  mycophenolate (MYFORTIC) 360 MG TBEC EC tablet Take 360 mg by mouth See admin instructions. 720mg  in the am and 360mg  in pm 08/12/18  Yes [provider]  Naloxone HCl (EVZIO) 0.4 MG/0.4ML SOAJ as needed.  04/05/15  Yes [provider]  nepafenac (ILEVRO) 0.3 % ophthalmic suspension Apply 1 drop to eye every other day. 07/11/18  Yes [provider]  OXYGEN Inhale 2 L/min into the lungs at bedtime.   Yes [provider]  pantoprazole (PROTONIX) 40 MG tablet Take 1 tablet (40 mg total) by mouth 2 (two) times daily. 08/06/18  Yes Hoy Register, MD  polyethylene glycol (MIRALAX / GLYCOLAX) packet Take 17 g by mouth daily as needed for mild constipation or moderate constipation. 03/06/14  Yes Ambrose Finland, NP  Potassium Chloride ER 20 MEQ TBCR Take 20 mEq by mouth daily. 08/06/18  Yes Hoy Register, MD  prednisoLONE acetate (PRED FORTE) 1 % ophthalmic suspension Place 1 drop into the left eye 4 (four) times daily. 09/12/18  Yes [provider]  predniSONE (DELTASONE) 10 MG tablet  06/28/17  Yes [provider]  pregabalin (LYRICA) 300 MG capsule Take 300 mg by mouth 2 (two) times daily.   Yes [provider]  ranitidine (ZANTAC) 150 MG capsule Take 150 mg by mouth 2 (two) times daily. 07/31/18 07/31/19 Yes [provider]  rizatriptan (MAXALT) 10 MG tablet Take 1 tab at the onset of migraine.May repeat  in 2 hours if needed. Not to exceed 2tabs/24 hours 07/16/18  Yes Butch Penny, NP  SYMBICORT 80-4.5 MCG/ACT inhaler Inhale 2 puffs into the lungs 2 (two) times daily.  02/09/17  Yes [provider]  topiramate (TOPAMAX) 100 MG tablet Take 1.5 tablets (150 mg total) by mouth daily. 07/16/18  Yes Butch Penny, NP  traMADol (ULTRAM) 50 MG tablet Take 1-2 tablets (50-100 mg total) by mouth every 4 (four) hours as needed (cough). 04/28/14  Yes Nyoka Cowden, MD  Vitamin D, Ergocalciferol, (DRISDOL) 50000 units CAPS capsule Take 1 capsule (50,000 Units total) by mouth every 7 (seven) days. 03/20/18  Yes Hoy Register, MD  megestrol (MEGACE) 40 MG/ML suspension SHAKE LIQUID AND TAKE 10 ML(400 MG) BY MOUTH DAILY Patient not taking: Reported on 07/16/2018 11/23/17   Quentin Angst, MD    Family History Family History  Problem Relation Age of Onset  . Hypertension Mother   . Hepatitis C Mother   . Diabetes Father   . Hypertension Father   . Bipolar disorder Brother   . Multiple sclerosis Sister     Social  History Social History   Tobacco Use  . Smoking status: Former Smoker    Packs/day: 0.00    Years: 10.00    Pack years: 0.00    Types: Cigarettes    Last attempt to quit: 09/18/2004    Years since quitting: 14.0  . Smokeless tobacco: Never Used  Substance Use Topics  . Alcohol use: No  . Drug use: No     Allergies   Shellfish allergy; Gabapentin; and Pregabalin   Review of Systems Review of Systems  All other systems reviewed and are negative.    Physical Exam Updated Vital Signs BP 103/64   Pulse (!) 101   Temp (!) 101.2 F (38.4 C) (Oral)   Resp 19   Ht 5\' 1"  (1.549 m)   Wt 49.9 kg   SpO2 99%   BMI 20.78 kg/m   Physical Exam Vitals signs and nursing note reviewed.  Constitutional:      Appearance: She is well-developed.     Comments: Febrile, pale  HENT:     Head: Normocephalic and atraumatic.  Eyes:     Conjunctiva/sclera: Conjunctivae  normal.  Neck:     Musculoskeletal: Neck supple.     Comments: No meningeal signs. Cardiovascular:     Rate and Rhythm: Normal rate and regular rhythm.  Pulmonary:     Effort: Pulmonary effort is normal.     Breath sounds: Normal breath sounds.  Abdominal:     General: Bowel sounds are normal.     Palpations: Abdomen is soft.  Genitourinary:    Comments: Minimal bilateral flank tenderness Musculoskeletal: Normal range of motion.  Skin:    General: Skin is warm and dry.  Neurological:     Mental Status: She is alert and oriented to person, place, and time.  Psychiatric:        Behavior: Behavior normal.      ED Treatments / Results  Labs (all labs ordered are listed, but only abnormal results are displayed) Labs Reviewed  CBC WITH DIFFERENTIAL/PLATELET - Abnormal; Notable for the following components:      Result Value   Hemoglobin 11.5 (*)    MCH 24.8 (*)    MCHC 29.4 (*)    Neutro Abs 8.0 (*)    Lymphs Abs 0.3 (*)    All other components within normal limits  COMPREHENSIVE METABOLIC PANEL - Abnormal; Notable for the following components:   Potassium 3.3 (*)    Chloride 112 (*)    CO2 19 (*)    Glucose, Bld 128 (*)    Creatinine, Ser 1.02 (*)    Calcium 8.3 (*)    Total Protein 6.1 (*)    All other components within normal limits  URINALYSIS, ROUTINE W REFLEX MICROSCOPIC - Abnormal; Notable for the following components:   APPearance HAZY (*)    Hgb urine dipstick SMALL (*)    Protein, ur 30 (*)    Leukocytes, UA MODERATE (*)    WBC, UA >50 (*)    All other components within normal limits  URINE CULTURE  LIPASE, BLOOD  TROPONIN I    EKG EKG Interpretation  Date/Time:  Sunday September 29 2018 19:29:00 EST Ventricular Rate:  82 PR Interval:    QRS Duration: 94 QT Interval:  522 QTC Calculation: 610 R Axis:   67 Text Interpretation:  Sinus rhythm Prolonged QT interval Confirmed by Donnetta Hutchingook, Mari Battaglia ([id number]) on 09/29/2018 8:06:35 PM   Radiology Dg Chest 2  View  Result Date: 09/29/2018 CLINICAL DATA:  Weakness, fever EXAM: CHEST - 2 VIEW COMPARISON:  CTA chest dated 03/04/2014 FINDINGS: Postsurgical changes in the left upper/mid lung and lateral left lung base. No focal consolidation. Right lung is clear. No pleural effusion or pneumothorax. The heart is normal in size. Visualized osseous structures are within normal limits. IMPRESSION: Postsurgical changes in the left hemithorax, as above. No evidence of acute cardiopulmonary disease. Electronically Signed   By: Charline Bills M.D.   On: 09/29/2018 19:39    Procedures Procedures (including critical care time)  Medications Ordered in ED Medications  sodium chloride 0.9 % bolus 1,000 mL (0 mLs Intravenous Stopped 09/29/18 2150)  sodium chloride 0.9 % bolus 1,000 mL (1,000 mLs Intravenous New Bag/Given 09/29/18 2154)  cefTRIAXone (ROCEPHIN) 1 g in sodium chloride 0.9 % 100 mL IVPB (1 g Intravenous New Bag/Given 09/29/18 2156)  fentaNYL (SUBLIMAZE) injection 50 mcg (50 mcg Intravenous Given 09/29/18 2151)     Initial Impression / Assessment and Plan / ED Course  I have reviewed the triage vital signs and the nursing notes.  Pertinent labs & imaging results that were available during my care of the patient were reviewed by me and considered in my medical decision making (see chart for details).     Patient presents with fever, malaise, dysuria.  White count within normal limits.  Urinalysis grossly infected.  IV fluids, IV Rocephin, urine culture, admit.  Final Clinical Impressions(s) / ED Diagnoses   Final diagnoses:  Pyelonephritis    ED Discharge Orders    None       Donnetta Hutching, MD 09/29/18 2311

## 2018-09-30 ENCOUNTER — Encounter (HOSPITAL_COMMUNITY): Payer: Self-pay | Admitting: Internal Medicine

## 2018-09-30 DIAGNOSIS — N10 Acute tubulo-interstitial nephritis: Secondary | ICD-10-CM | POA: Diagnosis not present

## 2018-09-30 DIAGNOSIS — J849 Interstitial pulmonary disease, unspecified: Secondary | ICD-10-CM | POA: Diagnosis present

## 2018-09-30 DIAGNOSIS — Z820 Family history of epilepsy and other diseases of the nervous system: Secondary | ICD-10-CM | POA: Diagnosis not present

## 2018-09-30 DIAGNOSIS — I130 Hypertensive heart and chronic kidney disease with heart failure and stage 1 through stage 4 chronic kidney disease, or unspecified chronic kidney disease: Secondary | ICD-10-CM | POA: Diagnosis present

## 2018-09-30 DIAGNOSIS — D899 Disorder involving the immune mechanism, unspecified: Secondary | ICD-10-CM | POA: Diagnosis not present

## 2018-09-30 DIAGNOSIS — M797 Fibromyalgia: Secondary | ICD-10-CM | POA: Diagnosis present

## 2018-09-30 DIAGNOSIS — G40909 Epilepsy, unspecified, not intractable, without status epilepticus: Secondary | ICD-10-CM | POA: Diagnosis present

## 2018-09-30 DIAGNOSIS — M329 Systemic lupus erythematosus, unspecified: Secondary | ICD-10-CM | POA: Diagnosis present

## 2018-09-30 DIAGNOSIS — Z90710 Acquired absence of both cervix and uterus: Secondary | ICD-10-CM | POA: Diagnosis not present

## 2018-09-30 DIAGNOSIS — R509 Fever, unspecified: Secondary | ICD-10-CM | POA: Diagnosis not present

## 2018-09-30 DIAGNOSIS — J8410 Pulmonary fibrosis, unspecified: Secondary | ICD-10-CM | POA: Diagnosis present

## 2018-09-30 DIAGNOSIS — N390 Urinary tract infection, site not specified: Secondary | ICD-10-CM

## 2018-09-30 DIAGNOSIS — A419 Sepsis, unspecified organism: Secondary | ICD-10-CM | POA: Diagnosis present

## 2018-09-30 DIAGNOSIS — Z8249 Family history of ischemic heart disease and other diseases of the circulatory system: Secondary | ICD-10-CM | POA: Diagnosis not present

## 2018-09-30 DIAGNOSIS — K219 Gastro-esophageal reflux disease without esophagitis: Secondary | ICD-10-CM | POA: Diagnosis present

## 2018-09-30 DIAGNOSIS — G894 Chronic pain syndrome: Secondary | ICD-10-CM | POA: Diagnosis not present

## 2018-09-30 DIAGNOSIS — Z7982 Long term (current) use of aspirin: Secondary | ICD-10-CM | POA: Diagnosis not present

## 2018-09-30 DIAGNOSIS — J449 Chronic obstructive pulmonary disease, unspecified: Secondary | ICD-10-CM | POA: Diagnosis present

## 2018-09-30 DIAGNOSIS — N132 Hydronephrosis with renal and ureteral calculous obstruction: Secondary | ICD-10-CM | POA: Diagnosis not present

## 2018-09-30 DIAGNOSIS — G8929 Other chronic pain: Secondary | ICD-10-CM | POA: Diagnosis present

## 2018-09-30 DIAGNOSIS — Z7952 Long term (current) use of systemic steroids: Secondary | ICD-10-CM | POA: Diagnosis not present

## 2018-09-30 DIAGNOSIS — N136 Pyonephrosis: Secondary | ICD-10-CM | POA: Diagnosis present

## 2018-09-30 DIAGNOSIS — N189 Chronic kidney disease, unspecified: Secondary | ICD-10-CM | POA: Diagnosis present

## 2018-09-30 DIAGNOSIS — Z79899 Other long term (current) drug therapy: Secondary | ICD-10-CM | POA: Diagnosis not present

## 2018-09-30 DIAGNOSIS — N12 Tubulo-interstitial nephritis, not specified as acute or chronic: Secondary | ICD-10-CM | POA: Diagnosis present

## 2018-09-30 DIAGNOSIS — Z7951 Long term (current) use of inhaled steroids: Secondary | ICD-10-CM | POA: Diagnosis not present

## 2018-09-30 DIAGNOSIS — Z818 Family history of other mental and behavioral disorders: Secondary | ICD-10-CM | POA: Diagnosis not present

## 2018-09-30 DIAGNOSIS — E559 Vitamin D deficiency, unspecified: Secondary | ICD-10-CM | POA: Diagnosis present

## 2018-09-30 DIAGNOSIS — Z833 Family history of diabetes mellitus: Secondary | ICD-10-CM | POA: Diagnosis not present

## 2018-09-30 DIAGNOSIS — G43909 Migraine, unspecified, not intractable, without status migrainosus: Secondary | ICD-10-CM | POA: Diagnosis present

## 2018-09-30 DIAGNOSIS — I5032 Chronic diastolic (congestive) heart failure: Secondary | ICD-10-CM | POA: Diagnosis present

## 2018-09-30 HISTORY — DX: Urinary tract infection, site not specified: N39.0

## 2018-09-30 LAB — BASIC METABOLIC PANEL
Anion gap: 8 (ref 5–15)
BUN: 13 mg/dL (ref 6–20)
CHLORIDE: 114 mmol/L — AB (ref 98–111)
CO2: 16 mmol/L — AB (ref 22–32)
Calcium: 7.6 mg/dL — ABNORMAL LOW (ref 8.9–10.3)
Creatinine, Ser: 1.04 mg/dL — ABNORMAL HIGH (ref 0.44–1.00)
GFR calc Af Amer: >60 mL/min (ref >60)
GFR calc non Af Amer: >60 mL/min (ref >60)
Glucose, Bld: 114 mg/dL — ABNORMAL HIGH (ref 70–99)
Potassium: 3.1 mmol/L — ABNORMAL LOW (ref 3.5–5.1)
Sodium: 138 mmol/L (ref 135–145)

## 2018-09-30 LAB — CBC WITH DIFFERENTIAL/PLATELET
Abs Immature Granulocytes: 0.33 10*3/uL — ABNORMAL HIGH (ref 0.00–0.07)
BASOS ABS: 0 10*3/uL (ref 0.0–0.1)
Basophils Relative: 0 %
Eosinophils Absolute: 0 10*3/uL (ref 0.0–0.5)
Eosinophils Relative: 0 %
HCT: 34.8 % — ABNORMAL LOW (ref 36.0–46.0)
HEMOGLOBIN: 10 g/dL — AB (ref 12.0–15.0)
Immature Granulocytes: 2 %
LYMPHS ABS: 0.4 10*3/uL — AB (ref 0.7–4.0)
Lymphocytes Relative: 2 %
MCH: 25.1 pg — ABNORMAL LOW (ref 26.0–34.0)
MCHC: 28.7 g/dL — AB (ref 30.0–36.0)
MCV: 87.4 fL (ref 80.0–100.0)
Monocytes Absolute: 0.8 10*3/uL (ref 0.1–1.0)
Monocytes Relative: 4 %
Neutro Abs: 17.4 10*3/uL — ABNORMAL HIGH (ref 1.7–7.7)
Neutrophils Relative %: 92 %
Platelets: 104 10*3/uL — ABNORMAL LOW (ref 150–400)
RBC: 3.98 MIL/uL (ref 3.87–5.11)
RDW: 15.5 % (ref 11.5–15.5)
WBC: 18.9 10*3/uL — ABNORMAL HIGH (ref 4.0–10.5)
nRBC: 0 % (ref 0.0–0.2)

## 2018-09-30 LAB — CREATININE, SERUM
CREATININE: 1 mg/dL (ref 0.44–1.00)
GFR calc Af Amer: >60 mL/min (ref >60)
GFR calc non Af Amer: >60 mL/min (ref >60)

## 2018-09-30 LAB — CBC
HCT: 31.5 % — ABNORMAL LOW (ref 36.0–46.0)
Hemoglobin: 9 g/dL — ABNORMAL LOW (ref 12.0–15.0)
MCH: 25.1 pg — AB (ref 26.0–34.0)
MCHC: 28.6 g/dL — ABNORMAL LOW (ref 30.0–36.0)
MCV: 88 fL (ref 80.0–100.0)
Platelets: 99 10*3/uL — ABNORMAL LOW (ref 150–400)
RBC: 3.58 MIL/uL — ABNORMAL LOW (ref 3.87–5.11)
RDW: 15.3 % (ref 11.5–15.5)
WBC: 14.5 10*3/uL — ABNORMAL HIGH (ref 4.0–10.5)
nRBC: 0 % (ref 0.0–0.2)

## 2018-09-30 MED ORDER — MYCOPHENOLATE SODIUM 180 MG PO TBEC: 360.0000 mg | DELAYED_RELEASE_TABLET | ORAL | Status: DC

## 2018-09-30 MED ORDER — TRAMADOL HCL 50 MG PO TABS
100.0000 mg | ORAL_TABLET | Freq: Once | ORAL | Status: AC
  Administered 2018-09-30: 100 mg via ORAL
  Filled 2018-09-30: qty 2

## 2018-09-30 MED ORDER — LEVETIRACETAM 500 MG PO TABS
500.0000 mg | ORAL_TABLET | Freq: Two times a day (BID) | ORAL | Status: DC
  Administered 2018-09-30 – 2018-10-05 (×12): 500 mg via ORAL
  Filled 2018-09-30 (×12): qty 1

## 2018-09-30 MED ORDER — PANTOPRAZOLE SODIUM 20 MG PO TBEC
20.0000 mg | DELAYED_RELEASE_TABLET | Freq: Two times a day (BID) | ORAL | Status: DC
  Administered 2018-09-30: 20 mg via ORAL
  Filled 2018-09-30 (×3): qty 1

## 2018-09-30 MED ORDER — PREGABALIN 50 MG PO CAPS: 300.0000 mg | ORAL_CAPSULE | Freq: Two times a day (BID) | ORAL | Status: DC

## 2018-09-30 MED ORDER — ACETAMINOPHEN 325 MG PO TABS
650.0000 mg | ORAL_TABLET | Freq: Four times a day (QID) | ORAL | Status: DC | PRN
  Administered 2018-09-30 – 2018-10-02 (×3): 650 mg via ORAL
  Filled 2018-09-30 (×3): qty 2

## 2018-09-30 MED ORDER — TOPIRAMATE 25 MG PO TABS
150.0000 mg | ORAL_TABLET | Freq: Every day | ORAL | Status: DC
  Administered 2018-09-30 – 2018-10-04 (×5): 150 mg via ORAL
  Filled 2018-09-30 (×4): qty 2
  Filled 2018-09-30: qty 1
  Filled 2018-09-30: qty 2

## 2018-09-30 MED ORDER — HYDROXYCHLOROQUINE SULFATE 200 MG PO TABS: 200.0000 mg | ORAL_TABLET | Freq: Every day | ORAL | Status: DC

## 2018-09-30 MED ORDER — ONDANSETRON HCL 4 MG PO TABS: 4.0000 mg | ORAL_TABLET | Freq: Four times a day (QID) | ORAL | Status: DC | PRN

## 2018-09-30 MED ORDER — PREDNISONE 20 MG PO TABS
10.0000 mg | ORAL_TABLET | Freq: Every day | ORAL | Status: DC
  Administered 2018-09-30 – 2018-10-02 (×3): 10 mg via ORAL
  Filled 2018-09-30 (×3): qty 1

## 2018-09-30 MED ORDER — PREGABALIN 100 MG PO CAPS
300.0000 mg | ORAL_CAPSULE | Freq: Two times a day (BID) | ORAL | Status: DC
  Administered 2018-09-30 (×3): 300 mg via ORAL
  Filled 2018-09-30: qty 6
  Filled 2018-09-30 (×2): qty 3

## 2018-09-30 MED ORDER — ACETAMINOPHEN 500 MG PO TABS
1000.0000 mg | ORAL_TABLET | Freq: Once | ORAL | Status: AC
  Administered 2018-09-30: 1000 mg via ORAL
  Filled 2018-09-30: qty 2

## 2018-09-30 MED ORDER — PREDNISOLONE ACETATE 1 % OP SUSP
1.0000 [drp] | Freq: Four times a day (QID) | OPHTHALMIC | Status: DC
  Administered 2018-09-30 – 2018-10-05 (×13): 1 [drp] via OPHTHALMIC
  Filled 2018-09-30: qty 5

## 2018-09-30 MED ORDER — HYDROXYCHLOROQUINE SULFATE 200 MG PO TABS
200.0000 mg | ORAL_TABLET | ORAL | Status: DC
  Administered 2018-09-30 – 2018-10-04 (×3): 200 mg via ORAL
  Filled 2018-09-30 (×3): qty 1

## 2018-09-30 MED ORDER — ENOXAPARIN SODIUM 40 MG/0.4ML ~~LOC~~ SOLN
40.0000 mg | SUBCUTANEOUS | Status: DC
  Administered 2018-10-01 – 2018-10-04 (×3): 40 mg via SUBCUTANEOUS
  Filled 2018-09-30 (×4): qty 0.4

## 2018-09-30 MED ORDER — SODIUM CHLORIDE 0.9 % IV BOLUS
1000.0000 mL | Freq: Once | INTRAVENOUS | Status: AC
  Administered 2018-09-30: 1000 mL via INTRAVENOUS

## 2018-09-30 MED ORDER — SODIUM CHLORIDE 0.9 % IV SOLN
1.0000 g | Freq: Two times a day (BID) | INTRAVENOUS | Status: DC
  Administered 2018-09-30 – 2018-10-02 (×5): 1 g via INTRAVENOUS
  Filled 2018-09-30 (×4): qty 1
  Filled 2018-09-30: qty 10
  Filled 2018-09-30: qty 1

## 2018-09-30 MED ORDER — FENTANYL 50 MCG/HR TD PT72
50.0000 ug | MEDICATED_PATCH | TRANSDERMAL | Status: DC
  Administered 2018-10-02 – 2018-10-05 (×2): 50 ug via TRANSDERMAL
  Filled 2018-09-30 (×2): qty 1

## 2018-09-30 MED ORDER — MYCOPHENOLATE SODIUM 180 MG PO TBEC
360.0000 mg | DELAYED_RELEASE_TABLET | Freq: Every day | ORAL | Status: DC
  Administered 2018-09-30 – 2018-10-04 (×5): 360 mg via ORAL
  Filled 2018-09-30 (×5): qty 2

## 2018-09-30 MED ORDER — PANTOPRAZOLE SODIUM 40 MG PO TBEC: 40.0000 mg | DELAYED_RELEASE_TABLET | Freq: Two times a day (BID) | ORAL | Status: DC

## 2018-09-30 MED ORDER — MYCOPHENOLATE SODIUM 180 MG PO TBEC
720.0000 mg | DELAYED_RELEASE_TABLET | Freq: Every morning | ORAL | Status: DC
  Administered 2018-09-30 – 2018-10-05 (×6): 720 mg via ORAL
  Filled 2018-09-30 (×6): qty 4

## 2018-09-30 MED ORDER — MOMETASONE FURO-FORMOTEROL FUM 100-5 MCG/ACT IN AERO
2.0000 | INHALATION_SPRAY | Freq: Two times a day (BID) | RESPIRATORY_TRACT | Status: DC
  Administered 2018-09-30 – 2018-10-05 (×8): 2 via RESPIRATORY_TRACT
  Filled 2018-09-30 (×2): qty 8.8

## 2018-09-30 MED ORDER — TRAMADOL HCL 50 MG PO TABS
50.0000 mg | ORAL_TABLET | ORAL | Status: DC | PRN
  Administered 2018-09-30 – 2018-10-01 (×4): 50 mg via ORAL
  Administered 2018-10-01: 100 mg via ORAL
  Administered 2018-10-02: 50 mg via ORAL
  Administered 2018-10-02: 100 mg via ORAL
  Administered 2018-10-02: 50 mg via ORAL
  Administered 2018-10-03 (×2): 100 mg via ORAL
  Filled 2018-09-30 (×5): qty 1
  Filled 2018-09-30: qty 2
  Filled 2018-09-30 (×2): qty 1
  Filled 2018-09-30 (×3): qty 2
  Filled 2018-09-30: qty 1

## 2018-09-30 MED ORDER — HYDROXYCHLOROQUINE SULFATE 200 MG PO TABS
400.0000 mg | ORAL_TABLET | ORAL | Status: DC
  Administered 2018-10-01 – 2018-10-05 (×3): 400 mg via ORAL
  Filled 2018-09-30 (×3): qty 2

## 2018-09-30 MED ORDER — ONDANSETRON HCL 4 MG/2ML IJ SOLN
4.0000 mg | Freq: Four times a day (QID) | INTRAMUSCULAR | Status: DC | PRN
  Administered 2018-10-03: 4 mg via INTRAVENOUS
  Filled 2018-09-30: qty 2

## 2018-09-30 MED ORDER — FAMOTIDINE 20 MG PO TABS
20.0000 mg | ORAL_TABLET | Freq: Every day | ORAL | Status: DC
  Administered 2018-09-30 – 2018-10-04 (×6): 20 mg via ORAL
  Filled 2018-09-30 (×6): qty 1

## 2018-09-30 NOTE — Progress Notes (Signed)
Admitted earlier today.  Continue antibiotics.  Follow cultures.  As per H&P done earlier today - "[name] [name] is a 58 y.o. female  with SLE, ILD/pulm fibrosis, fibromylagia, hx of seizures and migraines who presented with 2 weeks of dysuria and progressive malaise and lower L flank pain. Patient reports having UTIs in the past and had actually contacted her outpatient rheumatologist at [facility] who had reportedly sent a prescription for ciprofloxacin this past week. However patient was unable to fill the prescription and has not been able to take any antibiotics. Has been feeling chills x 3-4 days. Reports persistent burning sensation with urination for several days as well as increased frequency. Associated mild to moderate nausea. No emesis. At baseline has intermittent non productive cough but denies new or worsening respiratory sx from baseline".

## 2018-09-30 NOTE — Progress Notes (Signed)
Ms Kawahara. Has concerns about the meds that have been ordered for her. I sent a message to Dr Dartha Lodge to look at her meds in the am.. She states that she is upset that she is not scribe the meds that she takes at home.

## 2018-09-30 NOTE — Progress Notes (Signed)
Lyrica 30 mg 19 capsules taken to the pharmacy for them to dispense patient  Own medication BID

## 2018-09-30 NOTE — Progress Notes (Signed)
Patient does not take blood products-per her preference, not related to religion

## 2018-09-30 NOTE — ED Notes (Signed)
ED TO INPATIENT HANDOFF REPORT  Name/Age/Gender [name] [name] 58 y.o. female  Code Status    Code Status Orders  (From admission, onward)         Start     Ordered   09/30/18 0010  Full code  Continuous     09/30/18 0013        Code Status History    Date Active Date Inactive Code Status Order ID Comments User Context   11/21/2013 1510 11/28/2013 1807 Full Code [id number]  Newt [facility]ukes Inpatient   11/18/2013 1528 11/21/2013 1510 Full Code [id number]  Ky Barban, MD Inpatient   11/02/2013 0537 11/06/2013 1905 Full Code [id number]  Baltazar Apo, MD Inpatient   10/15/2013 0308 10/28/2013 2012 Full Code [id number]  Rozell Searing [facility] Inpatient   10/05/2013 2141 10/10/2013 1848 Full Code [id number]  Ky Barban, MD Inpatient      Home/SNF/Other Home  Chief Complaint Fever/Fatigue  Level of Care/Admitting Diagnosis ED Disposition    ED Disposition Condition Comment   Admit  Hospital Area: [facility] [[id number]]  Level of Care: Med-Surg [16]  Diagnosis: UTI (urinary tract infection) [557322]  Admitting Physician: Ike Bene [[id number]]  Attending Physician: Ike Bene [[id number]]  Estimated length of stay: past midnight tomorrow  Certification:: I certify this patient will need inpatient services for at least 2 midnights  PT Class (Do Not Modify): Inpatient [101]  PT Acc Code (Do Not Modify): Private [1]       Medical History Past Medical History:  Diagnosis Date  . Chronic pain   . COPD (chronic obstructive pulmonary disease) (HCC)   . Fibromyalgia   . Migraine   . Migraine without aura, with intractable migraine, so stated, without mention of status migrainosus 05/18/2014  . Pneumonia   . Pulmonary fibrosis (HCC)   . Seizures (HCC)   . SLE (systemic lupus erythematosus) (HCC) 12/12/2017    Allergies Allergies  Allergen Reactions  . Shellfish Allergy Anaphylaxis  . Gabapentin     rash  . Pregabalin Swelling    Able to tolerate Lyrica (brand name only)    IV Location/Drains/Wounds Patient Lines/Drains/Airways Status   Active Line/Drains/Airways    Name:   Placement date:   Placement time:   Site:   Days:   Peripheral IV 09/29/18 Left Antecubital   09/29/18    -    Antecubital   1   Incision (Closed) 11/21/13 Other (Comment)   11/21/13    1229     1774          Labs/Imaging Results for orders placed or performed during the hospital encounter of 09/29/18 (from the past 48 hour(s))  CBC with Differential     Status: Abnormal   Collection Time: 09/29/18  7:06 PM  Result Value Ref Range   WBC 9.1 4.0 - 10.5 K/uL   RBC 4.63 3.87 - 5.11 MIL/uL   Hemoglobin 11.5 (L) 12.0 - 15.0 g/dL   HCT 31.5 17.6 - 16.0 %   MCV 84.4 80.0 - 100.0 fL   MCH 24.8 (L) 26.0 - 34.0 pg   MCHC 29.4 (L) 30.0 - 36.0 g/dL   RDW 73.7 10.6 - 26.9 %   Platelets 153 150 - 400 K/uL   nRBC 0.0 0.0 - 0.2 %   Neutrophils Relative % 87 %   Neutro Abs 8.0 (H) 1.7 - 7.7 K/uL   Lymphocytes Relative 4 %   Lymphs Abs 0.3 (L) 0.7 - 4.0  K/uL   Monocytes Relative 7 %   Monocytes Absolute 0.6 0.1 - 1.0 K/uL   Eosinophils Relative 2 %   Eosinophils Absolute 0.2 0.0 - 0.5 K/uL   Basophils Relative 0 %   Basophils Absolute 0.0 0.0 - 0.1 K/uL   Immature Granulocytes 0 %   Abs Immature Granulocytes 0.04 0.00 - 0.07 K/uL    Comment: Performed at [facility], [address] [place], [place] [address]  Comprehensive metabolic panel     Status: Abnormal   Collection Time: 09/29/18  7:06 PM  Result Value Ref Range   Sodium 140 135 - 145 mmol/L   Potassium 3.3 (L) 3.5 - 5.1 mmol/L   Chloride 112 (H) 98 - 111 mmol/L   CO2 19 (L) 22 - 32 mmol/L   Glucose, Bld 128 (H) 70 - 99 mg/dL   BUN 15 6 - 20 mg/dL   Creatinine, Ser 8.411.02 (H) 0.44 - 1.00 mg/dL   Calcium 8.3 (L) 8.9 - 10.3 mg/dL   Total Protein 6.1 (L) 6.5 - 8.1 g/dL   Albumin 3.6 3.5 - 5.0 g/dL   AST 17 15 - 41 U/L   ALT 8 0 - 44 U/L   Alkaline Phosphatase  97 38 - 126 U/L   Total Bilirubin 0.5 0.3 - 1.2 mg/dL   GFR calc non Af Amer >60 >60 mL/min   GFR calc Af Amer >60 >60 mL/min   Anion gap 9 5 - 15    Comment: Performed at [facility], [address] [place], [place] [address]  Lipase, blood     Status: None   Collection Time: 09/29/18  7:06 PM  Result Value Ref Range   Lipase 25 11 - 51 U/L    Comment: Performed at [facility], [address] [place], [place] [address]  Troponin I - Once     Status: None   Collection Time: 09/29/18  7:06 PM  Result Value Ref Range   Troponin I <0.03 <0.03 ng/mL    Comment: Performed at [facility], [address] [place], [place] [address]  Urinalysis, Routine w reflex microscopic     Status: Abnormal   Collection Time: 09/29/18  8:27 PM  Result Value Ref Range   Color, Urine YELLOW YELLOW   APPearance HAZY (A) CLEAR   Specific Gravity, Urine 1.014 1.005 - 1.030   pH 7.0 5.0 - 8.0   Glucose, UA NEGATIVE NEGATIVE mg/dL   Hgb urine dipstick SMALL (A) NEGATIVE   Bilirubin Urine NEGATIVE NEGATIVE   Ketones, ur NEGATIVE NEGATIVE mg/dL   Protein, ur 30 (A) NEGATIVE mg/dL   Nitrite NEGATIVE NEGATIVE   Leukocytes, UA MODERATE (A) NEGATIVE   RBC / HPF 21-50 0 - 5 RBC/hpf   WBC, UA >50 (H) 0 - 5 WBC/hpf   Bacteria, UA NONE SEEN NONE SEEN   Mucus PRESENT     Comment: Performed at [facility], [address] [place], [place] [address]  CBC     Status: Abnormal   Collection Time: 09/30/18 12:18 AM  Result Value Ref Range   WBC 14.5 (H) 4.0 - 10.5 K/uL   RBC 3.58 (L) 3.87 - 5.11 MIL/uL   Hemoglobin 9.0 (L) 12.0 - 15.0 g/dL   HCT 42.531.5 (L) 95.636.0 - 38.746.0 %   MCV 88.0 80.0 - 100.0 fL   MCH 25.1 (L) 26.0 - 34.0 pg   MCHC 28.6 (L) 30.0 - 36.0 g/dL  RDW 15.3 11.5 - 15.5 %   Platelets 99 (L) 150 - 400 K/uL    Comment: REPEATED TO VERIFY PLATELET COUNT CONFIRMED BY SMEAR SPECIMEN CHECKED FOR CLOTS Immature Platelet Fraction  may be clinically indicated, consider ordering this additional test [id number]    nRBC 0.0 0.0 - 0.2 %    Comment: Performed at [facility], [address] [place], [place] [address]  Creatinine, serum     Status: None   Collection Time: 09/30/18 12:18 AM  Result Value Ref Range   Creatinine, Ser 1.00 0.44 - 1.00 mg/dL   GFR calc non Af Amer >60 >60 mL/min   GFR calc Af Amer >60 >60 mL/min    Comment: Performed at [facility], [address] [place], [place] [address]  Basic metabolic panel     Status: Abnormal   Collection Time: 09/30/18  5:28 AM  Result Value Ref Range   Sodium 138 135 - 145 mmol/L   Potassium 3.1 (L) 3.5 - 5.1 mmol/L   Chloride 114 (H) 98 - 111 mmol/L   CO2 16 (L) 22 - 32 mmol/L   Glucose, Bld 114 (H) 70 - 99 mg/dL   BUN 13 6 - 20 mg/dL   Creatinine, Ser 7.82 (H) 0.44 - 1.00 mg/dL   Calcium 7.6 (L) 8.9 - 10.3 mg/dL   GFR calc non Af Amer >60 >60 mL/min   GFR calc Af Amer >60 >60 mL/min   Anion gap 8 5 - 15    Comment: Performed at [facility], [address], [place], [place] [address]  CBC with Differential/Platelet     Status: Abnormal   Collection Time: 09/30/18  5:28 AM  Result Value Ref Range   WBC 18.9 (H) 4.0 - 10.5 K/uL   RBC 3.98 3.87 - 5.11 MIL/uL   Hemoglobin 10.0 (L) 12.0 - 15.0 g/dL   HCT 30.8 (L) 65.7 - 84.6 %   MCV 87.4 80.0 - 100.0 fL   MCH 25.1 (L) 26.0 - 34.0 pg   MCHC 28.7 (L) 30.0 - 36.0 g/dL   RDW 96.2 95.2 - 84.1 %   Platelets 104 (L) 150 - 400 K/uL    Comment: Immature Platelet Fraction may be clinically indicated, consider ordering this additional test [id number] CONSISTENT WITH PREVIOUS RESULT    nRBC 0.0 0.0 - 0.2 %   Neutrophils Relative % 92 %   Neutro Abs 17.4 (H) 1.7 - 7.7 K/uL   Lymphocytes Relative 2 %   Lymphs Abs 0.4 (L) 0.7 - 4.0 K/uL   Monocytes Relative 4 %   Monocytes Absolute 0.8 0.1 - 1.0 K/uL   Eosinophils Relative 0 %   Eosinophils Absolute 0.0  0.0 - 0.5 K/uL   Basophils Relative 0 %   Basophils Absolute 0.0 0.0 - 0.1 K/uL   Immature Granulocytes 2 %   Abs Immature Granulocytes 0.33 (H) 0.00 - 0.07 K/uL    Comment: Performed at [facility], [address] [place], [place] [address]   Dg Chest 2 View  Result Date: 09/29/2018 CLINICAL DATA:  Weakness, fever EXAM: CHEST - 2 VIEW COMPARISON:  CTA chest dated 03/04/2014 FINDINGS: Postsurgical changes in the left upper/mid lung and lateral left lung base. No focal consolidation. Right lung is clear. No pleural effusion or pneumothorax. The heart is normal in size. Visualized osseous structures are within normal limits. IMPRESSION: Postsurgical changes in the left hemithorax, as above. No evidence of acute cardiopulmonary disease. Electronically  Signed   By: Charline BillsSriyesh  Krishnan M.D.   On: 09/29/2018 19:39    Pending Labs Unresulted Labs (From admission, onward)    Start     Ordered   10/07/18 0500  Creatinine, serum  (enoxaparin (LOVENOX)    CrCl >/= 30 ml/min)  Weekly,   R    Comments:  while on enoxaparin therapy    09/30/18 0013   10/01/18 0500  Magnesium  Daily,   R     09/30/18 0032   09/30/18 0500  Basic metabolic panel  Daily,   R     09/30/18 0032   09/30/18 0500  CBC with Differential/Platelet  Daily,   R     09/30/18 0032   09/29/18 2140  Urine culture  ONCE - STAT,   STAT     09/29/18 2139          Vitals/Pain Today's Vitals   09/30/18 0615 09/30/18 0630 09/30/18 0645 09/30/18 0700  BP: (!) 107/59 110/60 103/60 105/60  Pulse: 80 87 89 89  Resp: (!) 21 (!) 22 (!) 30 (!) 25  Temp:      TempSrc:      SpO2: 100% 100% 100% 100%  Weight:      Height:        Isolation Precautions No active isolations  Medications Medications  fentaNYL (DURAGESIC - dosed mcg/hr) 50 mcg (has no administration in time range)  hydroxychloroquine (PLAQUENIL) tablet 200-400 mg (has no administration in time range)  levETIRAcetam (KEPPRA) tablet 500 mg (500 mg  Oral Given 09/30/18 0328)  mycophenolate (MYFORTIC) EC tablet 360-720 mg (has no administration in time range)  prednisoLONE acetate (PRED FORTE) 1 % ophthalmic suspension 1 drop (has no administration in time range)  predniSONE (DELTASONE) tablet 10 mg (has no administration in time range)  traMADol (ULTRAM) tablet 50-100 mg (has no administration in time range)  topiramate (TOPAMAX) tablet 150 mg (150 mg Oral Not Given 09/30/18 0405)  mometasone-formoterol (DULERA) 100-5 MCG/ACT inhaler 2 puff (has no administration in time range)  enoxaparin (LOVENOX) injection 40 mg (has no administration in time range)  cefTRIAXone (ROCEPHIN) 1 g in sodium chloride 0.9 % 100 mL IVPB (has no administration in time range)  ondansetron (ZOFRAN) tablet 4 mg (has no administration in time range)    Or  ondansetron (ZOFRAN) injection 4 mg (has no administration in time range)  famotidine (PEPCID) tablet 20 mg (20 mg Oral Given 09/30/18 0328)  pantoprazole (PROTONIX) EC tablet 20 mg (has no administration in time range)  acetaminophen (TYLENOL) tablet 650 mg (has no administration in time range)  pregabalin (LYRICA) capsule 300 mg (300 mg Oral Given 09/30/18 0412)  sodium chloride 0.9 % bolus 1,000 mL (0 mLs Intravenous Stopped 09/29/18 2150)  sodium chloride 0.9 % bolus 1,000 mL (0 mLs Intravenous Stopped 09/30/18 0024)  cefTRIAXone (ROCEPHIN) 1 g in sodium chloride 0.9 % 100 mL IVPB (0 g Intravenous Stopped 09/29/18 2230)  fentaNYL (SUBLIMAZE) injection 50 mcg (50 mcg Intravenous Given 09/29/18 2151)  traMADol (ULTRAM) tablet 100 mg (100 mg Oral Given 09/30/18 0328)  sodium chloride 0.9 % bolus 1,000 mL (0 mLs Intravenous Stopped 09/30/18 0230)  acetaminophen (TYLENOL) tablet 1,000 mg (1,000 mg Oral Given 09/30/18 0328)    Mobility walks

## 2018-09-30 NOTE — H&P (Addendum)
History and Physical  [name] [name] YSH:[id number] DOB: 1961-03-30 DOA: 09/29/2018 1806  Referring physician: Shelly Coss PCP: Hoy Register, MD  Outpatient Specialists: Justin Mend [facility]undria Rud ) and [facility]  HISTORY   Chief Complaint: dysuria, malaise, flank pain  HPI: [name] [name] is a 58 y.o. female  with SLE, ILD/pulm fibrosis, fibromylagia, hx of seizures and migraines who presented with 2 weeks of dysuria and progressive malaise and lower L flank pain. Patient reports having UTIs in the past and had actually contacted her outpatient rheumatologist at [facility] who had reportedly sent a prescription for ciprofloxacin this past week. However patient was unable to fill the prescription and has not been able to take any antibiotics. Has been feeling chills x 3-4 days. Reports persistent burning sensation with urination for several days as well as increased frequency. Associated mild to moderate nausea. No emesis. At baseline has intermittent non productive cough but denies new or worsening respiratory sx from baseline.   Review of Systems: + chills, dysuria + increased frequency, L flank pain + chronic cough and chronic pain - no chest pain, dyspnea on exertion - no edema, PND, orthopnea - no nausea/vomiting; no tarry, melanotic or bloody stools - no dysuria, increased urinary frequency - no weight changes  Rest of systems reviewed are negative, except as per above history.   ED course:  Vitals Blood pressure 100/60, pulse 81, temperature (!) 101.2 F (38.4 C), temperature source Oral, resp. rate 20, height 5\' 1"  (1.549 m), weight 49.9 kg, SpO2 100 %. Received NS bolus x 2; ceftriaxone 1gm x 1; fentanyl 50 mcg x 1  Past Medical History:  Diagnosis Date  . Chronic pain   . COPD (chronic obstructive pulmonary disease) (HCC)   . Fibromyalgia   . Migraine   . Migraine without aura, with intractable migraine, so stated, without mention of status migrainosus 05/18/2014  . Pneumonia   .  Pulmonary fibrosis (HCC)   . Seizures (HCC)   . SLE (systemic lupus erythematosus) (HCC) 12/12/2017   Past Surgical History:  Procedure Laterality Date  . ABDOMINAL HYSTERECTOMY    . LUNG BIOPSY Left 11/21/2013   Procedure: LUNG BIOPSY;  Surgeon: Delight Ovens, MD;  Location: [facility] OR;  Service: Thoracic;  Laterality: Left;  Marland Kitchen VIDEO ASSISTED THORACOSCOPY Left 11/21/2013   Procedure: VIDEO ASSISTED THORACOSCOPY;  Surgeon: Delight Ovens, MD;  Location: [facility] OR;  Service: Thoracic;  Laterality: Left;  Marland Kitchen VIDEO BRONCHOSCOPY N/A 11/21/2013   Procedure: VIDEO BRONCHOSCOPY;  Surgeon: Delight Ovens, MD;  Location: [facility] OR;  Service: Thoracic;  Laterality: N/A;    Social History:  reports that she quit smoking about 14 years ago. Her smoking use included cigarettes. She smoked 0.00 packs per day for 10.00 years. She has never used smokeless tobacco. She reports that she does not drink alcohol or use drugs.  Allergies  Allergen Reactions  . Shellfish Allergy Anaphylaxis  . Gabapentin     rash  . Pregabalin Swelling    Able to tolerate Lyrica (brand name only)    Family History  Problem Relation Age of Onset  . Hypertension Mother   . Hepatitis C Mother   . Diabetes Father   . Hypertension Father   . Bipolar disorder Brother   . Multiple sclerosis Sister       Prior to Admission medications   Medication Sig Start Date End Date Taking? Authorizing Provider  aspirin EC 81 MG tablet Take 81 mg by mouth daily. 09/09/18 09/09/19  Yes [provider]  atorvastatin (LIPITOR) 40 MG tablet Take 1 tablet (40 mg total) by mouth daily. 08/06/18  Yes Hoy Register, MD  Cyanocobalamin (B-12) 2000 MCG TABS Take 2,000 mcg by mouth daily. 08/29/17  Yes Quentin Angst, MD  etodolac (LODINE) 500 MG tablet Take 1 tablet (500 mg total) by mouth 2 (two) times daily. 04/28/14  Yes Nyoka Cowden, MD  fentaNYL (DURAGESIC - DOSED MCG/HR) 50 MCG/HR Place 1 patch (50 mcg total) onto the skin every 3  (three) days. 04/28/14  Yes Nyoka Cowden, MD  folic acid (FOLVITE) 1 MG tablet Take 1 tablet (1 mg total) by mouth daily. 08/29/17  Yes Quentin Angst, MD  furosemide (LASIX) 20 MG tablet TAKE 1 TABLET(20 MG) BY MOUTH DAILY 08/13/18  Yes Hoy Register, MD  hydroxychloroquine (PLAQUENIL) 200 MG tablet 200mg  (1 pill) alternating with 400mg  (2 pills) every other day 10/29/17  Yes [provider]  levETIRAcetam (KEPPRA) 500 MG tablet TAKE 1 TABLET(500 MG) BY MOUTH TWICE DAILY 01/03/18  Yes Butch Penny, NP  mycophenolate (CELLCEPT) 500 MG tablet 1,000 mg 2 (two) times daily. 06/26/17  Yes [provider]  mycophenolate (MYFORTIC) 360 MG TBEC EC tablet Take 360 mg by mouth See admin instructions. 720mg  in the am and 360mg  in pm 08/12/18  Yes [provider]  Naloxone HCl (EVZIO) 0.4 MG/0.4ML SOAJ as needed.  04/05/15  Yes [provider]  nepafenac (ILEVRO) 0.3 % ophthalmic suspension Apply 1 drop to eye every other day. 07/11/18  Yes [provider]  OXYGEN Inhale 2 L/min into the lungs at bedtime.   Yes [provider]  pantoprazole (PROTONIX) 40 MG tablet Take 1 tablet (40 mg total) by mouth 2 (two) times daily. 08/06/18  Yes Hoy Register, MD  polyethylene glycol (MIRALAX / GLYCOLAX) packet Take 17 g by mouth daily as needed for mild constipation or moderate constipation. 03/06/14  Yes Ambrose Finland, NP  Potassium Chloride ER 20 MEQ TBCR Take 20 mEq by mouth daily. 08/06/18  Yes Hoy Register, MD  prednisoLONE acetate (PRED FORTE) 1 % ophthalmic suspension Place 1 drop into the left eye 4 (four) times daily. 09/12/18  Yes [provider]  predniSONE (DELTASONE) 10 MG tablet  06/28/17  Yes [provider]  pregabalin (LYRICA) 300 MG capsule Take 300 mg by mouth 2 (two) times daily.   Yes [provider]  ranitidine (ZANTAC) 150 MG capsule Take 150 mg by mouth 2 (two) times daily. 07/31/18 07/31/19 Yes  [provider]  rizatriptan (MAXALT) 10 MG tablet Take 1 tab at the onset of migraine.May repeat in 2 hours if needed. Not to exceed 2tabs/24 hours 07/16/18  Yes Butch Penny, NP  SYMBICORT 80-4.5 MCG/ACT inhaler Inhale 2 puffs into the lungs 2 (two) times daily.  02/09/17  Yes [provider]  topiramate (TOPAMAX) 100 MG tablet Take 1.5 tablets (150 mg total) by mouth daily. 07/16/18  Yes Butch Penny, NP  traMADol (ULTRAM) 50 MG tablet Take 1-2 tablets (50-100 mg total) by mouth every 4 (four) hours as needed (cough). 04/28/14  Yes Nyoka Cowden, MD  Vitamin D, Ergocalciferol, (DRISDOL) 50000 units CAPS capsule Take 1 capsule (50,000 Units total) by mouth every 7 (seven) days. 03/20/18  Yes Hoy Register, MD  megestrol (MEGACE) 40 MG/ML suspension SHAKE LIQUID AND TAKE 10 ML(400 MG) BY MOUTH DAILY Patient not taking: Reported on 07/16/2018 11/23/17   Quentin Angst, MD    PHYSICAL EXAM  Temp:  [101.2 F (38.4 C)] 101.2 F (38.4 C) (01/12 1813) Pulse Rate:  [77-102] 81 (01/13 0145) Resp:  [16-29] 20 (01/13 0145) BP: (80-128)/(31-80) 100/60 (01/13 0145) SpO2:  [96 %-100 %] 100 % (01/13 0145) Weight:  [49.9 kg] 49.9 kg (01/12 1821)  BP 100/60   Pulse 81   Temp (!) 101.2 F (38.4 C) (Oral)   Resp 20   Ht 5\' 1"  (1.549 m)   Wt 49.9 kg   SpO2 100%   BMI 20.78 kg/m    GEN thin middle-aged hispanic female; resting in bed  HEENT NCAT EOM intact PERRL; clear oropharynx, no cervical LAD; moist mucus membranes  JVP estimated 5 cm H2O above RA; no HJR ; no carotid bruits b/l ;  CV regular normal rate; normal S1 and S2; no m/r/g or S3/S4; PMI non displaced; no parasternal heave  RESP diminished at bases; no wheezing or crackles; breathing unlabored and symmetric  ABD soft NT ND +normoactive BS; well healed abodminal scar EXT warm throughout b/l; no peripheral edema b/l  R groin puncture site is well-healed: clean and dry PULSES  DP and radials 2+ intact b/l    SKIN/MSK no rashes or lesions  NEURO/PSYCH AAOx4; no focal deficits   DATA   LABS ON ADMISSION:  Basic Metabolic Panel: Recent Labs  Lab 09/29/18 1906 09/30/18 0018  NA 140  --   K 3.3*  --   CL 112*  --   CO2 19*  --   GLUCOSE 128*  --   BUN 15  --   CREATININE 1.02* 1.00  CALCIUM 8.3*  --    CBC: Recent Labs  Lab 09/29/18 1906 09/30/18 0018  WBC 9.1 14.5*  NEUTROABS 8.0*  --   HGB 11.5* 9.0*  HCT 39.1 31.5*  MCV 84.4 88.0  PLT 153 99*   Liver Function Tests: Recent Labs  Lab 09/29/18 1906  AST 17  ALT 8  ALKPHOS 97  BILITOT 0.5  PROT 6.1*  ALBUMIN 3.6   Recent Labs  Lab 09/29/18 1906  LIPASE 25   No results for input(s): AMMONIA in the last 168 hours. Coagulation:  Lab Results  Component Value Date   INR 1.26 11/20/2013   INR 1.43 11/18/2013   No results found for: PTT Lactic Acid, Venous:     Component Value Date/Time   LATICACIDVEN 1.93 11/18/2013 0958   Cardiac Enzymes: Recent Labs  Lab 09/29/18 1906  TROPONINI <0.03   Urinalysis:    Component Value Date/Time   COLORURINE YELLOW 09/29/2018 2027   APPEARANCEUR HAZY (A) 09/29/2018 2027   APPEARANCEUR Cloudy (A) 07/13/2017 1354   LABSPEC 1.014 09/29/2018 2027   PHURINE 7.0 09/29/2018 2027   GLUCOSEU NEGATIVE 09/29/2018 2027   HGBUR SMALL (A) 09/29/2018 2027   BILIRUBINUR NEGATIVE 09/29/2018 2027   BILIRUBINUR negative 03/19/2018 1717   BILIRUBINUR Negative 07/13/2017 1354   KETONESUR NEGATIVE 09/29/2018 2027   PROTEINUR 30 (A) 09/29/2018 2027   UROBILINOGEN 1.0 03/19/2018 1717   UROBILINOGEN 0.2 09/10/2014 1142   NITRITE NEGATIVE 09/29/2018 2027   LEUKOCYTESUR MODERATE (A) 09/29/2018 2027   LEUKOCYTESUR 2+ (A) 07/13/2017 1354    BNP (last 3 results) No results for input(s): PROBNP in the last 8760 hours. CBG: No results for input(s): GLUCAP in the last 168 hours.  Radiological Exams on Admission: Dg Chest 2 View  Result Date: 09/29/2018 CLINICAL DATA:  Weakness,  fever EXAM: CHEST - 2 VIEW COMPARISON:  CTA chest dated 03/04/2014 FINDINGS: Postsurgical changes in  the left upper/mid lung and lateral left lung base. No focal consolidation. Right lung is clear. No pleural effusion or pneumothorax. The heart is normal in size. Visualized osseous structures are within normal limits. IMPRESSION: Postsurgical changes in the left hemithorax, as above. No evidence of acute cardiopulmonary disease. Electronically Signed   By: Charline BillsSriyesh  Krishnan M.D.   On: 09/29/2018 19:39    EKG: Independently reviewed. NSR with non specific ST-T changes  I have reviewed the patient's previous electronic chart records, labs, and other data.   ASSESSMENT AND PLAN   Assessment: [name] [name] is a 58 y.o. female with SLE, ILD, fibromylagia, hx of seizures and migraines who presented with dysuria, malaise, L sided lower back/flank pain with UA suggestive of infection. Febrile and initially hypotensive, fluid responsive. Will treat for ascending UTI/pyelonephritis. Of note, patient is on cellcept and chronic prednisone and [facility] shows that patient's blood pressures have ranged from 80-90s systolics.   Active Problems:   UTI (urinary tract infection)   Plan:   # UTI concern for ascending infection/pyelonephritis; immunocompromised - urine cultures pending - continue ceftriaxone 1gm q12h  -pain control as below with home regimen - tylenol 1gm x 1 overnight for fever - low threshold for renal ultrasound especially if renal function worsens - zofran prn nausea - s/p NS bolus x 3 - NOTE: chronic hypotension with baseline systolics 80-100s (see Care Everwhere)  # Lupus and fibromyalgia (chronic) - resume home cellcept and plaquenil (alternating doses) - resume home prednisone 10mg  daily - pred-forte eye drops resumed - resume home pain regimen: lyrica and tramadol - resume fentanyl patch (due on 1/15 Wed)  # Hx of GERD - resume PPI and H2 blocker  (non formulary)  # Hx of ILD and prior episode of COP/BOOP > no active ILD flare - resume inhalers (note: started dulera here because non formulary) - prednisone as above  # Hx of seizures and migraines > no recent seizure events - resume topamax and keppra     DVT Prophylaxis: lovenox Code Status:  Full Code Family Communication: discussed plan with patient and sister  Disposition Plan: admit to floor for UTI infection    Patient contact: Extended Emergency Contact Information Primary Emergency Contact: Verdene LennertAudaz,Sandra  [place] Home Phone: [phone] Relation: Sister  Time spent: > 35 mins  Ike Benearolyn J Dondra Rhett, MD [facility] Pager [phone]  If 7PM-7AM, please contact night-coverage [email or web address] Password [email or web address] 09/30/2018, 2:02 AM

## 2018-10-01 DIAGNOSIS — N12 Tubulo-interstitial nephritis, not specified as acute or chronic: Secondary | ICD-10-CM

## 2018-10-01 DIAGNOSIS — D899 Disorder involving the immune mechanism, unspecified: Secondary | ICD-10-CM

## 2018-10-01 DIAGNOSIS — M329 Systemic lupus erythematosus, unspecified: Secondary | ICD-10-CM

## 2018-10-01 LAB — BASIC METABOLIC PANEL
ANION GAP: 8 (ref 5–15)
BUN: 21 mg/dL — ABNORMAL HIGH (ref 6–20)
CO2: 20 mmol/L — ABNORMAL LOW (ref 22–32)
Calcium: 8.3 mg/dL — ABNORMAL LOW (ref 8.9–10.3)
Chloride: 114 mmol/L — ABNORMAL HIGH (ref 98–111)
Creatinine, Ser: 1.09 mg/dL — ABNORMAL HIGH (ref 0.44–1.00)
GFR calc non Af Amer: 57 mL/min — ABNORMAL LOW (ref >60)
Glucose, Bld: 130 mg/dL — ABNORMAL HIGH (ref 70–99)
Potassium: 3.8 mmol/L (ref 3.5–5.1)
Sodium: 142 mmol/L (ref 135–145)

## 2018-10-01 LAB — CBC WITH DIFFERENTIAL/PLATELET
Abs Immature Granulocytes: 0.1 10*3/uL — ABNORMAL HIGH (ref 0.00–0.07)
BASOS ABS: 0 10*3/uL (ref 0.0–0.1)
Basophils Relative: 0 %
Eosinophils Absolute: 0 10*3/uL (ref 0.0–0.5)
Eosinophils Relative: 0 %
HCT: 32.9 % — ABNORMAL LOW (ref 36.0–46.0)
Hemoglobin: 9.5 g/dL — ABNORMAL LOW (ref 12.0–15.0)
Immature Granulocytes: 1 %
Lymphocytes Relative: 5 %
Lymphs Abs: 0.7 10*3/uL (ref 0.7–4.0)
MCH: 25.7 pg — ABNORMAL LOW (ref 26.0–34.0)
MCHC: 28.9 g/dL — ABNORMAL LOW (ref 30.0–36.0)
MCV: 88.9 fL (ref 80.0–100.0)
Monocytes Absolute: 0.8 10*3/uL (ref 0.1–1.0)
Monocytes Relative: 6 %
Neutro Abs: 12.3 10*3/uL — ABNORMAL HIGH (ref 1.7–7.7)
Neutrophils Relative %: 88 %
Platelets: 96 10*3/uL — ABNORMAL LOW (ref 150–400)
RBC: 3.7 MIL/uL — ABNORMAL LOW (ref 3.87–5.11)
RDW: 15.9 % — AB (ref 11.5–15.5)
WBC: 13.9 10*3/uL — AB (ref 4.0–10.5)
nRBC: 0 % (ref 0.0–0.2)

## 2018-10-01 LAB — MAGNESIUM: Magnesium: 1.9 mg/dL (ref 1.7–2.4)

## 2018-10-01 LAB — URINE CULTURE

## 2018-10-01 MED ORDER — IBUPROFEN 200 MG PO TABS
400.0000 mg | ORAL_TABLET | Freq: Once | ORAL | Status: DC
  Filled 2018-10-01: qty 2

## 2018-10-01 MED ORDER — SODIUM CHLORIDE 0.9 % IV BOLUS
500.0000 mL | Freq: Once | INTRAVENOUS | Status: AC
  Administered 2018-10-02: 500 mL via INTRAVENOUS

## 2018-10-01 MED ORDER — PREGABALIN 100 MG PO CAPS
300.0000 mg | ORAL_CAPSULE | Freq: Two times a day (BID) | ORAL | Status: DC
  Administered 2018-10-01 – 2018-10-05 (×9): 300 mg via ORAL

## 2018-10-01 MED ORDER — PANTOPRAZOLE SODIUM 40 MG PO TBEC
40.0000 mg | DELAYED_RELEASE_TABLET | Freq: Two times a day (BID) | ORAL | Status: DC
  Administered 2018-10-01 – 2018-10-05 (×9): 40 mg via ORAL
  Filled 2018-10-01 (×9): qty 1

## 2018-10-01 NOTE — Progress Notes (Signed)
Temp is 102.9. Vs are stable. I notified Dr Dartha Lodge. He has placed orders for blood cultures. Tylenol will be administered

## 2018-10-01 NOTE — Progress Notes (Signed)
PROGRESS NOTE    [name] [name]  AVW:[id number] DOB: 01/21/1961 DOA: 09/29/2018 PCP: Hoy RegisterNewlin, Enobong, MD  Outpatient Specialists:   Brief Narrative:  [name] Flashieves Audazis a 58 y.o.femalewith SLE, ILD/pulm fibrosis, fibromylagia, hx of seizures and migraines who presented with 2 weeks of dysuria and progressive malaise and lower L flank pain.  Apparently, [facility] that revealed positive nitrite on 09/19/2018, but the urine culture grew mixed flora.  [facility], but patient never filled the prescription.  Urine culture done of this hospital stay pending.  Patient is currently on IV Rocephin.  Patient's urinary symptoms are improving.  No documented fever overnight.  Dysuria has also improved.  Will follow pending cultures.   Assessment & Plan:   Active Problems:   UTI (urinary tract infection)  UTI concern for ascending infection/pyelonephritis; immunocompromised (complicated): - urine cultures pending - continue ceftriaxone 1gm q12h  -pain control as below with home regimen - low threshold for renal ultrasound especially if renal function worsens - zofran prn nausea - NOTE: chronic hypotension with baseline systolics 80-100s (see [facility])  Lupus and fibromyalgia (chronic): - Continue home cellcept and plaquenil (alternating doses) - Continue home prednisone 10mg  daily - pred-forte eye drops resumed -Continue home pain regimen: lyrica and tramadol -Continue fentanyl patch (due on 1/15 Wed)  Hx of GERD: -Continue PPI and H2 blocker (non formulary)  Hx of ILD and prior episode of COP/BOOP: No active ILD flare - resume inhalers (note: started dulera here because non formulary) - prednisone as above  Hx of seizures and migraines: No recent seizure events - resume topamax and keppra     DVT Prophylaxis: lovenox Code Status:  Full Code Family Communication: discussed plan  with patient and sister  Disposition Plan: admit to floor for UTI infection     Consultants:   None  Procedures:   None  Antimicrobials:   IV Rocephin   Subjective: No fever or chills Dysuria has improved  Objective: Vitals:   09/30/18 2043 09/30/18 2202 10/01/18 0521 10/01/18 0939  BP: (!) 74/46  (!) 102/54   Pulse: 60  68   Resp: 18  17   Temp: 97.8 F (36.6 C)  98.1 F (36.7 C)   TempSrc: Oral  Oral   SpO2: 100% 99% 98% 96%  Weight:      Height:        Intake/Output Summary (Last 24 hours) at 10/01/2018 1047 Last data filed at 10/01/2018 0400 Gross per 24 hour  Intake 440 ml  Output 600 ml  Net -160 ml   Filed Weights   09/29/18 1821 09/30/18 0815  Weight: 49.9 kg 53.3 kg    Examination:  General exam: Appears calm and comfortable  Respiratory system: Clear to auscultation. Respiratory effort normal. Cardiovascular system: S1 & S2  Gastrointestinal system: Abdomen is nondistended, soft and nontender. No organomegaly or masses felt. Normal bowel sounds heard. Central nervous system: Alert and oriented.   Data Reviewed: I have personally reviewed following labs and imaging studies  CBC: Recent Labs  Lab 09/29/18 1906 09/30/18 0018 09/30/18 0528 10/01/18 0601  WBC 9.1 14.5* 18.9* 13.9*  NEUTROABS 8.0*  --  17.4* 12.3*  HGB 11.5* 9.0* 10.0* 9.5*  HCT 39.1 31.5* 34.8* 32.9*  MCV 84.4 88.0 87.4 88.9  PLT 153 99* 104* 96*   Basic Metabolic Panel: Recent Labs  Lab 09/29/18 1906 09/30/18 0018 09/30/18 0528 10/01/18 0601  NA 140  --  138  142  K 3.3*  --  3.1* 3.8  CL 112*  --  114* 114*  CO2 19*  --  16* 20*  GLUCOSE 128*  --  114* 130*  BUN 15  --  13 21*  CREATININE 1.02* 1.00 1.04* 1.09*  CALCIUM 8.3*  --  7.6* 8.3*  MG  --   --   --  1.9   GFR: Estimated Creatinine Clearance: 43.5 mL/min (A) (by C-G formula based on SCr of 1.09 mg/dL (H)). Liver Function Tests: Recent Labs  Lab 09/29/18 1906  AST 17  ALT 8  ALKPHOS 97    BILITOT 0.5  PROT 6.1*  ALBUMIN 3.6   Recent Labs  Lab 09/29/18 1906  LIPASE 25   No results for input(s): AMMONIA in the last 168 hours. Coagulation Profile: No results for input(s): INR, PROTIME in the last 168 hours. Cardiac Enzymes: Recent Labs  Lab 09/29/18 1906  TROPONINI <0.03   BNP (last 3 results) No results for input(s): PROBNP in the last 8760 hours. HbA1C: No results for input(s): HGBA1C in the last 72 hours. CBG: No results for input(s): GLUCAP in the last 168 hours. Lipid Profile: No results for input(s): CHOL, HDL, LDLCALC, TRIG, CHOLHDL, LDLDIRECT in the last 72 hours. Thyroid Function Tests: No results for input(s): TSH, T4TOTAL, FREET4, T3FREE, THYROIDAB in the last 72 hours. Anemia Panel: No results for input(s): VITAMINB12, FOLATE, FERRITIN, TIBC, IRON, RETICCTPCT in the last 72 hours. Urine analysis:    Component Value Date/Time   COLORURINE YELLOW 09/29/2018 2027   APPEARANCEUR HAZY (A) 09/29/2018 2027   APPEARANCEUR Cloudy (A) 07/13/2017 1354   LABSPEC 1.014 09/29/2018 2027   PHURINE 7.0 09/29/2018 2027   GLUCOSEU NEGATIVE 09/29/2018 2027   HGBUR SMALL (A) 09/29/2018 2027   BILIRUBINUR NEGATIVE 09/29/2018 2027   BILIRUBINUR negative 03/19/2018 1717   BILIRUBINUR Negative 07/13/2017 1354   KETONESUR NEGATIVE 09/29/2018 2027   PROTEINUR 30 (A) 09/29/2018 2027   UROBILINOGEN 1.0 03/19/2018 1717   UROBILINOGEN 0.2 09/10/2014 1142   NITRITE NEGATIVE 09/29/2018 2027   LEUKOCYTESUR MODERATE (A) 09/29/2018 2027   LEUKOCYTESUR 2+ (A) 07/13/2017 1354   Sepsis Labs: @LABRCNTIP (procalcitonin:4,lacticidven:4)  ) Recent Results (from the past 240 hour(s))  Urine culture     Status: None   Collection Time: 09/29/18  9:40 PM  Result Value Ref Range Status   Specimen Description   Final    URINE, RANDOM Performed at [facility], [address] [place], [place] [address]    Special Requests   Final    NONE Performed at  [facility], [address] [place], [place] [address]    Culture   Final    Multiple bacterial morphotypes present, none predominant. Suggest appropriate recollection if clinically indicated.   Report Status 10/01/2018 FINAL  Final         Radiology Studies: Dg Chest 2 View  Result Date: 09/29/2018 CLINICAL DATA:  Weakness, fever EXAM: CHEST - 2 VIEW COMPARISON:  CTA chest dated 03/04/2014 FINDINGS: Postsurgical changes in the left upper/mid lung and lateral left lung base. No focal consolidation. Right lung is clear. No pleural effusion or pneumothorax. The heart is normal in size. Visualized osseous structures are within normal limits. IMPRESSION: Postsurgical changes in the left hemithorax, as above. No evidence of acute cardiopulmonary disease. Electronically Signed   By: Charline Bills M.D.   On: 09/29/2018 19:39        Scheduled Meds: . enoxaparin (LOVENOX) injection  40 mg Subcutaneous Q24H  . famotidine  20 mg Oral QHS  . [START ON 10/02/2018] fentaNYL  50 mcg Transdermal Q72H  . hydroxychloroquine  200 mg Oral Q48H  . hydroxychloroquine  400 mg Oral Q48H  . levETIRAcetam  500 mg Oral BID  . mometasone-formoterol  2 puff Inhalation BID  . mycophenolate  360 mg Oral QHS  . mycophenolate  720 mg Oral q morning - 10a  . pantoprazole  20 mg Oral BID AC  . prednisoLONE acetate  1 drop Left Eye QID  . predniSONE  10 mg Oral Q breakfast  . pregabalin  300 mg Oral BID  . topiramate  150 mg Oral QHS   Continuous Infusions: . cefTRIAXone (ROCEPHIN)  IV 1 g (09/30/18 2242)     LOS: 1 day    Time spent: 35 minutes    Berton Mount, MD  [facility] Pager #: [phone] 7PM-7AM contact night coverage as above

## 2018-10-02 ENCOUNTER — Inpatient Hospital Stay (HOSPITAL_COMMUNITY): Payer: Medicaid Other

## 2018-10-02 DIAGNOSIS — R509 Fever, unspecified: Secondary | ICD-10-CM

## 2018-10-02 LAB — BASIC METABOLIC PANEL
ANION GAP: 9 (ref 5–15)
BUN: 19 mg/dL (ref 6–20)
CO2: 17 mmol/L — ABNORMAL LOW (ref 22–32)
Calcium: 7.6 mg/dL — ABNORMAL LOW (ref 8.9–10.3)
Chloride: 113 mmol/L — ABNORMAL HIGH (ref 98–111)
Creatinine, Ser: 1.08 mg/dL — ABNORMAL HIGH (ref 0.44–1.00)
GFR calc Af Amer: >60 mL/min (ref >60)
GFR calc non Af Amer: 57 mL/min — ABNORMAL LOW (ref >60)
GLUCOSE: 123 mg/dL — AB (ref 70–99)
Potassium: 3.3 mmol/L — ABNORMAL LOW (ref 3.5–5.1)
Sodium: 139 mmol/L (ref 135–145)

## 2018-10-02 LAB — CBC WITH DIFFERENTIAL/PLATELET
Abs Immature Granulocytes: 0.06 10*3/uL (ref 0.00–0.07)
Basophils Absolute: 0 10*3/uL (ref 0.0–0.1)
Basophils Relative: 0 %
EOS PCT: 0 %
Eosinophils Absolute: 0 10*3/uL (ref 0.0–0.5)
HEMATOCRIT: 31.2 % — AB (ref 36.0–46.0)
Hemoglobin: 9.1 g/dL — ABNORMAL LOW (ref 12.0–15.0)
Immature Granulocytes: 1 %
Lymphocytes Relative: 6 %
Lymphs Abs: 0.5 10*3/uL — ABNORMAL LOW (ref 0.7–4.0)
MCH: 25 pg — ABNORMAL LOW (ref 26.0–34.0)
MCHC: 29.2 g/dL — AB (ref 30.0–36.0)
MCV: 85.7 fL (ref 80.0–100.0)
Monocytes Absolute: 0.8 10*3/uL (ref 0.1–1.0)
Monocytes Relative: 8 %
Neutro Abs: 8 10*3/uL — ABNORMAL HIGH (ref 1.7–7.7)
Neutrophils Relative %: 85 %
Platelets: 88 10*3/uL — ABNORMAL LOW (ref 150–400)
RBC: 3.64 MIL/uL — ABNORMAL LOW (ref 3.87–5.11)
RDW: 15.5 % (ref 11.5–15.5)
WBC: 9.4 10*3/uL (ref 4.0–10.5)
nRBC: 0 % (ref 0.0–0.2)

## 2018-10-02 LAB — SEDIMENTATION RATE: Sed Rate: 25 mm/hr — ABNORMAL HIGH (ref 0–22)

## 2018-10-02 LAB — PROCALCITONIN: Procalcitonin: 14.49 ng/mL

## 2018-10-02 LAB — MAGNESIUM: Magnesium: 1.5 mg/dL — ABNORMAL LOW (ref 1.7–2.4)

## 2018-10-02 LAB — C-REACTIVE PROTEIN: CRP: 13.1 mg/dL — ABNORMAL HIGH (ref <1.0)

## 2018-10-02 MED ORDER — SODIUM CHLORIDE 0.9 % IV SOLN
2.0000 g | Freq: Three times a day (TID) | INTRAVENOUS | Status: DC
  Filled 2018-10-02: qty 2

## 2018-10-02 MED ORDER — SODIUM CHLORIDE 0.9 % IV SOLN
2.0000 g | Freq: Two times a day (BID) | INTRAVENOUS | Status: DC
  Administered 2018-10-02 – 2018-10-05 (×6): 2 g via INTRAVENOUS
  Filled 2018-10-02 (×8): qty 2

## 2018-10-02 MED ORDER — SODIUM CHLORIDE 0.9 % IV BOLUS
500.0000 mL | Freq: Once | INTRAVENOUS | Status: AC
  Administered 2018-10-02: 500 mL via INTRAVENOUS

## 2018-10-02 MED ORDER — VANCOMYCIN HCL IN DEXTROSE 750-5 MG/150ML-% IV SOLN
750.0000 mg | INTRAVENOUS | Status: DC
  Administered 2018-10-03: 750 mg via INTRAVENOUS
  Filled 2018-10-02: qty 150

## 2018-10-02 MED ORDER — SODIUM CHLORIDE 0.9 % IV BOLUS
1000.0000 mL | Freq: Once | INTRAVENOUS | Status: AC
  Administered 2018-10-02: 1000 mL via INTRAVENOUS

## 2018-10-02 MED ORDER — MAGNESIUM SULFATE 2 GM/50ML IV SOLN
2.0000 g | Freq: Once | INTRAVENOUS | Status: AC
  Administered 2018-10-02: 2 g via INTRAVENOUS
  Filled 2018-10-02: qty 50

## 2018-10-02 MED ORDER — KETOROLAC TROMETHAMINE 30 MG/ML IJ SOLN
30.0000 mg | Freq: Once | INTRAMUSCULAR | Status: AC
  Administered 2018-10-02: 30 mg via INTRAVENOUS
  Filled 2018-10-02: qty 1

## 2018-10-02 MED ORDER — POTASSIUM CHLORIDE CRYS ER 20 MEQ PO TBCR
40.0000 meq | EXTENDED_RELEASE_TABLET | Freq: Once | ORAL | Status: AC
  Administered 2018-10-02: 40 meq via ORAL
  Filled 2018-10-02: qty 2

## 2018-10-02 MED ORDER — ACETAMINOPHEN 325 MG PO TABS: 650.0000 mg | ORAL_TABLET | ORAL | Status: DC | PRN

## 2018-10-02 MED ORDER — HYDROCORTISONE NA SUCCINATE PF 100 MG IJ SOLR
50.0000 mg | Freq: Four times a day (QID) | INTRAMUSCULAR | Status: DC
  Administered 2018-10-02 – 2018-10-05 (×11): 50 mg via INTRAVENOUS
  Filled 2018-10-02 (×10): qty 2

## 2018-10-02 MED ORDER — VANCOMYCIN HCL 10 G IV SOLR
1250.0000 mg | Freq: Once | INTRAVENOUS | Status: AC
  Administered 2018-10-02: 1250 mg via INTRAVENOUS
  Filled 2018-10-02: qty 1250

## 2018-10-02 MED FILL — Fentanyl TD Patch 72HR 50 MCG/HR: TRANSDERMAL | Qty: 1 | Status: AC

## 2018-10-02 NOTE — Progress Notes (Signed)
Pt's temp was 103.1, TRH ordered a bolus of 500 ml & ibuprofen. Pt complained that she could not take ibuprofen because it causes stomach pain and she had already had her protonix and tramadol at that time and could not get more. After bolus, temp was still 103. TRH notified.

## 2018-10-02 NOTE — Progress Notes (Signed)
PROGRESS NOTE    [name] [name]  [id number]:[id number] DOB: 25-Jan-1961 DOA: 09/29/2018 PCP: Charlott Rakes, MD   Brief Narrative:  58 year old with past medical history relevant for systemic lupus erythematosus, HLD, chronic pain, hyperlipidemia, interstitial lung disease/pulmonary fibrosis, seizure disorder, migraines, fibromyalgia who presented with 2 weeks of dysuria, progressive malaise and left lower flank pain thought to be secondary to UTI and his now developed a fever of unclear etiology.   Assessment & Plan:   Active Problems:   UTI (urinary tract infection)   #) Fever/malaise/fatigue: Unclear etiology.  Initially this was attributed to possible UTI but her culture at [facility] grew out mixed flora and a culture here is unremarkable.  At this time the differential includes infection versus possible flare of lupus -Continue IV ceftriaxone 1 g daily started 09/29/2018 -Follow blood cultures ordered 10/01/2018 -Follow-up blood cultures ordered 10/02/2018 - will check inflammatory markers and procalcitonin  #) Lupus: unclear if patient has flare of lupus or not. -We will check ESR, CRP, C3, C4, anti-double-stranded DNA -Continue mycophenolate 720 mg every morning and 360 mg every afternoon -Continue hydroxychloroquine 200 mg 4 times daily, 40 mg 4 times daily -Continue present 10 mg daily, low threshold to start stress dose steroids if patient decompensates  #) Hyperlipidemia: -Continue aspirin 81 mg -Daily atorvastatin 40 mg daily  #) Migraine: -Continue topiramate 250 mg nightly -Continue PRN triptan  #) Seizure disorder: -Continue levetiracetam 5 mg twice daily  #) Pain/psych: -Continue pregabalin 300 mg twice daily -Continue fentanyl patch  Tolerating p.o. Electrolytes: Monitor and supplement Nutrition: Heart healthy diet  Prophylaxis: Enoxaparin  Disposition: Ending resolution of fevers  Full code    Consultants:   None  Procedures:   None  Antimicrobials:    IV ceftriaxone started 09/30/2018   Subjective: This morning the patient reports feeling slightly better.  She denies any nausea, vomiting, diarrhea, cough, congestion.  Last night patient had a fever.  Objective: Vitals:   10/01/18 2154 10/02/18 0138 10/02/18 0548 10/02/18 0811  BP: 105/67  96/64   Pulse: (!) 109  97   Resp: 20  20   Temp: (!) 103.1 F (39.5 C) (!) 103 F (39.4 C) (!) 102.6 F (39.2 C)   TempSrc: Oral Oral Oral   SpO2: 98%  100% 100%  Weight:      Height:        Intake/Output Summary (Last 24 hours) at 10/02/2018 1126 Last data filed at 10/02/2018 1117 Gross per 24 hour  Intake 1420.61 ml  Output 1501 ml  Net -80.39 ml   Filed Weights   09/29/18 1821 09/30/18 0815  Weight: 49.9 kg 53.3 kg    Examination:  General exam: Appears calm and comfortable  Respiratory system: No increased work of breathing, dry crackles bilaterally, no wheezes or rhonchi Cardiovascular system: Regular rate and rhythm, no murmurs Gastrointestinal system: Soft, nondistended, no rebound or guarding, plus bowel sounds. Central nervous system: Alert and oriented.  Grossly intact, moving all extremities Extremities: Trace lower extremity edema Skin: No rashes over visible skin Psychiatry: Judgement and insight appear normal. Mood & affect appropriate.     Data Reviewed: I have personally reviewed following labs and imaging studies  CBC: Recent Labs  Lab 09/29/18 1906 09/30/18 0018 09/30/18 0528 10/01/18 0601 10/02/18 0319  WBC 9.1 14.5* 18.9* 13.9* 9.4  NEUTROABS 8.0*  --  17.4* 12.3* 8.0*  HGB 11.5* 9.0* 10.0* 9.5* 9.1*  HCT 39.1 31.5* 34.8* 32.9* 31.2*  MCV 84.4 88.0 87.4 88.9 85.7  PLT 153 99* 104* 96* 88*   Basic Metabolic Panel: Recent Labs  Lab 09/29/18 1906 09/30/18 0018 09/30/18 0528 10/01/18 0601 10/02/18 0319  NA 140  --  138 142 139  K 3.3*  --  3.1* 3.8 3.3*  CL 112*  --  114* 114* 113*  CO2 19*  --  16* 20* 17*  GLUCOSE 128*  --  114* 130*  123*  BUN 15  --  13 21* 19  CREATININE 1.02* 1.00 1.04* 1.09* 1.08*  CALCIUM 8.3*  --  7.6* 8.3* 7.6*  MG  --   --   --  1.9 1.5*   GFR: Estimated Creatinine Clearance: 43.9 mL/min (A) (by C-G formula based on SCr of 1.08 mg/dL (H)). Liver Function Tests: Recent Labs  Lab 09/29/18 1906  AST 17  ALT 8  ALKPHOS 97  BILITOT 0.5  PROT 6.1*  ALBUMIN 3.6   Recent Labs  Lab 09/29/18 1906  LIPASE 25   No results for input(s): AMMONIA in the last 168 hours. Coagulation Profile: No results for input(s): INR, PROTIME in the last 168 hours. Cardiac Enzymes: Recent Labs  Lab 09/29/18 1906  TROPONINI <0.03   BNP (last 3 results) No results for input(s): PROBNP in the last 8760 hours. HbA1C: No results for input(s): HGBA1C in the last 72 hours. CBG: No results for input(s): GLUCAP in the last 168 hours. Lipid Profile: No results for input(s): CHOL, HDL, LDLCALC, TRIG, CHOLHDL, LDLDIRECT in the last 72 hours. Thyroid Function Tests: No results for input(s): TSH, T4TOTAL, FREET4, T3FREE, THYROIDAB in the last 72 hours. Anemia Panel: No results for input(s): VITAMINB12, FOLATE, FERRITIN, TIBC, IRON, RETICCTPCT in the last 72 hours. Sepsis Labs: No results for input(s): PROCALCITON, LATICACIDVEN in the last 168 hours.  Recent Results (from the past 240 hour(s))  Urine culture     Status: None   Collection Time: 09/29/18  9:40 PM  Result Value Ref Range Status   Specimen Description   Final    URINE, RANDOM Performed at [facility] [address] [place], [place] [address]    Special Requests   Final    NONE Performed at [facility], [place] [address] [place], [place] [address]    Culture   Final    Multiple bacterial morphotypes present, none predominant. Suggest appropriate recollection if clinically indicated.   Report Status 10/01/2018 FINAL  Final  Culture, blood (routine x 2)     Status: None (Preliminary result)   Collection  Time: 10/01/18  5:07 PM  Result Value Ref Range Status   Specimen Description   Final    BLOOD RIGHT ANTECUBITAL Performed at [facility] [address], [place], [place] [address]    Special Requests   Final    BOTTLES DRAWN AEROBIC AND ANAEROBIC Blood Culture adequate volume Performed at [facility] [address], [place], [place] [address]    Culture   Final    NO GROWTH < 24 HOURS Performed at [facility] [address], [place], [place] [address]    Report Status PENDING  Incomplete  Culture, blood (routine x 2)     Status: None (Preliminary result)   Collection Time: 10/01/18  5:12 PM  Result Value Ref Range Status   Specimen Description   Final    BLOOD RIGHT ARM Performed at [facility] [address] [place], [place] [address]    Special Requests  Final    BOTTLES DRAWN AEROBIC ONLY Blood Culture adequate volume Performed at [facility] [address] [place], [place] [address]    Culture   Final    NO GROWTH < 24 HOURS Performed at [facility] [address] [place], [place] [address]    Report Status PENDING  Incomplete         Radiology Studies: No results found.      Scheduled Meds: . enoxaparin (LOVENOX) injection  40 mg Subcutaneous Q24H  . famotidine  20 mg Oral QHS  . fentaNYL  50 mcg Transdermal Q72H  . hydroxychloroquine  200 mg Oral Q48H  . hydroxychloroquine  400 mg Oral Q48H  . ibuprofen  400 mg Oral Once  . levETIRAcetam  500 mg Oral BID  . mometasone-formoterol  2 puff Inhalation BID  . mycophenolate  360 mg Oral QHS  . mycophenolate  720 mg Oral q morning - 10a  . pantoprazole  40 mg Oral BID  . prednisoLONE acetate  1 drop Left Eye QID  . predniSONE  10 mg Oral Q breakfast  . pregabalin  300 mg Oral BID  . topiramate  150 mg Oral QHS   Continuous Infusions: . cefTRIAXone (ROCEPHIN)  IV 1 g (10/02/18 0010)     LOS: 2 days     Time spent: [place], MD [facility]  If 7PM-7AM, please contact night-coverage [email or web address] Password [facility] 10/02/2018, 11:26 AM

## 2018-10-02 NOTE — Progress Notes (Addendum)
Pharmacy Antibiotic Note  [name] [name] is a 58 y.o. female with an extenseive pmh including SLE admitted on 09/29/2018 with lower flank pain and fever of unknown etiology.  Pharmacy has been consulted for cefepime and vancomycin dosing. PCT elevated on day 3 abx (CTX).   Plan: Cefepime 2 g iv q 12 hours.   Vancomycin 1250 mg iv once followed by 750 mg iv q 24h. AUC 479 IBW/TBW.   F/U renal function, culture results, plans for abx.   Height: 5\' 1"  (154.9 cm) Weight: 117 lb 8.1 oz (53.3 kg) IBW/kg (Calculated) : 47.8  Temp (24hrs), Avg:102.1 F (38.9 C), Min:98.4 F (36.9 C), Max:103.1 F (39.5 C)  Recent Labs  Lab 09/29/18 1906 09/30/18 0018 09/30/18 0528 10/01/18 0601 10/02/18 0319  WBC 9.1 14.5* 18.9* 13.9* 9.4  CREATININE 1.02* 1.00 1.04* 1.09* 1.08*    Estimated Creatinine Clearance: 43.9 mL/min (A) (by C-G formula based on SCr of 1.08 mg/dL (H)).    Allergies  Allergen Reactions  . Shellfish Allergy Anaphylaxis  . Pregabalin Swelling    Able to tolerate Lyrica (brand name only)  . Gabapentin Rash    Antimicrobials this admission: 1/12 CTX >> 1/15 1/15 cefepime >>  1/15 vanc >>   Dose adjustments this admission:   Microbiology results: 1/14 BCx: NGTD 1/15 BCx:  1/12 UCx: ngf   Thank you for allowing pharmacy to be a part of this patient's care.  Luisa Hart D 10/02/2018 4:24 PM

## 2018-10-02 NOTE — Progress Notes (Signed)
Pt. blood pressure low 81/50, 73/49, recheck manual 80/49. Pt. denies complaints of chest pain, dizziness, no shortness of breath noted. Afebrile. Pt. states she does have some generalized pain she rates a 6 after pain medication given.  MD notified, new orders received.

## 2018-10-03 ENCOUNTER — Inpatient Hospital Stay (HOSPITAL_COMMUNITY): Payer: Medicaid Other | Admitting: Registered Nurse

## 2018-10-03 ENCOUNTER — Inpatient Hospital Stay (HOSPITAL_COMMUNITY): Payer: Medicaid Other

## 2018-10-03 ENCOUNTER — Encounter (HOSPITAL_COMMUNITY): Payer: Self-pay | Admitting: Registered Nurse

## 2018-10-03 ENCOUNTER — Encounter (HOSPITAL_COMMUNITY)
Admission: EM | Disposition: A | Discharge status: Home Health | Payer: Self-pay | Source: Home / Self Care | Attending: Internal Medicine

## 2018-10-03 HISTORY — PX: CYSTOSCOPY W/ URETERAL STENT PLACEMENT: SHX1429

## 2018-10-03 LAB — CBC
HCT: 36.1 % (ref 36.0–46.0)
Hemoglobin: 10.8 g/dL — ABNORMAL LOW (ref 12.0–15.0)
MCH: 24.8 pg — ABNORMAL LOW (ref 26.0–34.0)
MCHC: 29.9 g/dL — ABNORMAL LOW (ref 30.0–36.0)
MCV: 83 fL (ref 80.0–100.0)
Platelets: 68 10*3/uL — ABNORMAL LOW (ref 150–400)
RBC: 4.35 MIL/uL (ref 3.87–5.11)
RDW: 16 % — ABNORMAL HIGH (ref 11.5–15.5)
WBC: 6.9 10*3/uL (ref 4.0–10.5)
nRBC: 0 % (ref 0.0–0.2)

## 2018-10-03 LAB — COMPREHENSIVE METABOLIC PANEL
ALT: 21 U/L (ref 0–44)
AST: 31 U/L (ref 15–41)
Albumin: 3 g/dL — ABNORMAL LOW (ref 3.5–5.0)
Alkaline Phosphatase: 62 U/L (ref 38–126)
Anion gap: 10 (ref 5–15)
BUN: 16 mg/dL (ref 6–20)
CO2: 15 mmol/L — ABNORMAL LOW (ref 22–32)
Calcium: 8.1 mg/dL — ABNORMAL LOW (ref 8.9–10.3)
Chloride: 115 mmol/L — ABNORMAL HIGH (ref 98–111)
Creatinine, Ser: 0.91 mg/dL (ref 0.44–1.00)
GFR calc non Af Amer: >60 mL/min (ref >60)
Glucose, Bld: 157 mg/dL — ABNORMAL HIGH (ref 70–99)
Potassium: 5 mmol/L (ref 3.5–5.1)
Sodium: 140 mmol/L (ref 135–145)
Total Bilirubin: 0.6 mg/dL (ref 0.3–1.2)
Total Protein: 5.8 g/dL — ABNORMAL LOW (ref 6.5–8.1)

## 2018-10-03 LAB — PROCALCITONIN: Procalcitonin: 7.65 ng/mL

## 2018-10-03 LAB — COMPREHENSIVE METABOLIC PANEL WITH GFR: GFR calc Af Amer: >60 mL/min (ref >60)

## 2018-10-03 LAB — C4 COMPLEMENT: Complement C4, Body Fluid: 25 mg/dL (ref 14–44)

## 2018-10-03 LAB — MAGNESIUM: Magnesium: 2.6 mg/dL — ABNORMAL HIGH (ref 1.7–2.4)

## 2018-10-03 LAB — ANTI-DNA ANTIBODY, DOUBLE-STRANDED: ds DNA Ab: <1 IU/mL (ref 0–9)

## 2018-10-03 LAB — C3 COMPLEMENT: C3 Complement: 107 mg/dL (ref 82–167)

## 2018-10-03 SURGERY — CYSTOSCOPY, WITH RETROGRADE PYELOGRAM AND URETERAL STENT INSERTION
Anesthesia: General | Laterality: Left

## 2018-10-03 MED ORDER — ONDANSETRON HCL 4 MG/2ML IJ SOLN: 4.0000 mg | Freq: Once | INTRAMUSCULAR | Status: DC | PRN

## 2018-10-03 MED ORDER — IOHEXOL 300 MG/ML  SOLN
100.0000 mL | Freq: Once | INTRAMUSCULAR | Status: AC | PRN
  Administered 2018-10-03: 100 mL via INTRAVENOUS

## 2018-10-03 MED ORDER — EPHEDRINE 5 MG/ML INJ
INTRAVENOUS | Status: AC
  Filled 2018-10-03: qty 10

## 2018-10-03 MED ORDER — FENTANYL CITRATE (PF) 100 MCG/2ML IJ SOLN
INTRAMUSCULAR | Status: AC
  Filled 2018-10-03: qty 2

## 2018-10-03 MED ORDER — PROPOFOL 10 MG/ML IV BOLUS
INTRAVENOUS | Status: AC
  Filled 2018-10-03: qty 20

## 2018-10-03 MED ORDER — PROPOFOL 10 MG/ML IV BOLUS
INTRAVENOUS | Status: DC | PRN
  Administered 2018-10-03: 80 mg via INTRAVENOUS

## 2018-10-03 MED ORDER — MIDAZOLAM HCL 2 MG/2ML IJ SOLN
INTRAMUSCULAR | Status: AC
  Filled 2018-10-03: qty 2

## 2018-10-03 MED ORDER — SODIUM CHLORIDE (PF) 0.9 % IJ SOLN
INTRAMUSCULAR | Status: AC
  Filled 2018-10-03: qty 50

## 2018-10-03 MED ORDER — OXYCODONE HCL 5 MG/5ML PO SOLN: 5.0000 mg | Freq: Once | ORAL | Status: DC | PRN

## 2018-10-03 MED ORDER — OXYCODONE HCL 5 MG PO TABS: 5.0000 mg | ORAL_TABLET | Freq: Once | ORAL | Status: DC | PRN

## 2018-10-03 MED ORDER — PHENYLEPHRINE HCL 10 MG/ML IJ SOLN
INTRAMUSCULAR | Status: AC
  Filled 2018-10-03: qty 1

## 2018-10-03 MED ORDER — SODIUM CHLORIDE 0.9 % IV SOLN
INTRAVENOUS | Status: DC | PRN
  Administered 2018-10-03: 21:00:00 via INTRAVENOUS

## 2018-10-03 MED ORDER — DEXAMETHASONE SODIUM PHOSPHATE 10 MG/ML IJ SOLN
INTRAMUSCULAR | Status: DC | PRN
  Administered 2018-10-03: 5 mg via INTRAVENOUS

## 2018-10-03 MED ORDER — FENTANYL CITRATE (PF) 100 MCG/2ML IJ SOLN
INTRAMUSCULAR | Status: DC | PRN
  Administered 2018-10-03: 25 ug via INTRAVENOUS
  Administered 2018-10-03: 50 ug via INTRAVENOUS
  Administered 2018-10-03: 25 ug via INTRAVENOUS

## 2018-10-03 MED ORDER — DEXAMETHASONE SODIUM PHOSPHATE 10 MG/ML IJ SOLN
INTRAMUSCULAR | Status: AC
  Filled 2018-10-03: qty 1

## 2018-10-03 MED ORDER — LIDOCAINE HCL (CARDIAC) PF 50 MG/5ML IV SOSY
PREFILLED_SYRINGE | INTRAVENOUS | Status: DC | PRN
  Administered 2018-10-03: 50 mg via INTRAVENOUS

## 2018-10-03 MED ORDER — SODIUM CHLORIDE 0.9 % IV SOLN
INTRAVENOUS | Status: DC | PRN
  Administered 2018-10-03: 25 ug/min via INTRAVENOUS

## 2018-10-03 MED ORDER — ONDANSETRON HCL 4 MG/2ML IJ SOLN
INTRAMUSCULAR | Status: DC | PRN
  Administered 2018-10-03: 4 mg via INTRAVENOUS

## 2018-10-03 MED ORDER — IOHEXOL 300 MG/ML  SOLN: 30.0000 mL | Freq: Once | INTRAMUSCULAR | Status: DC | PRN

## 2018-10-03 MED ORDER — EPHEDRINE SULFATE 50 MG/ML IJ SOLN
INTRAMUSCULAR | Status: DC | PRN
  Administered 2018-10-03: 5 mg via INTRAVENOUS

## 2018-10-03 MED ORDER — SODIUM CHLORIDE 0.9 % IR SOLN
Status: DC | PRN
  Administered 2018-10-03: 3000 mL via INTRAVESICAL

## 2018-10-03 MED ORDER — FENTANYL CITRATE (PF) 100 MCG/2ML IJ SOLN
25.0000 ug | INTRAMUSCULAR | Status: DC | PRN
  Administered 2018-10-03 (×2): 25 ug via INTRAVENOUS

## 2018-10-03 MED ORDER — IOHEXOL 300 MG/ML  SOLN
INTRAMUSCULAR | Status: DC | PRN
  Administered 2018-10-03: 3 mL via URETHRAL

## 2018-10-03 MED ORDER — SODIUM CHLORIDE 0.9 % IV SOLN
INTRAVENOUS | Status: DC | PRN
  Administered 2018-10-03: 250 mL via INTRAVENOUS
  Administered 2018-10-03 – 2018-10-04 (×3): 1000 mL via INTRAVENOUS

## 2018-10-03 MED ORDER — ONDANSETRON HCL 4 MG/2ML IJ SOLN
INTRAMUSCULAR | Status: AC
  Filled 2018-10-03: qty 2

## 2018-10-03 MED ORDER — MIDAZOLAM HCL 5 MG/5ML IJ SOLN
INTRAMUSCULAR | Status: DC | PRN
  Administered 2018-10-03: 1 mg via INTRAVENOUS

## 2018-10-03 SURGICAL SUPPLY — 15 items
BAG URO CATCHER STRL LF (MISCELLANEOUS) ×3 IMPLANT
BASKET ZERO TIP NITINOL 2.4FR (BASKET) IMPLANT
CATH INTERMIT  6FR 70CM (CATHETERS) ×2 IMPLANT
CLOTH BEACON ORANGE TIMEOUT ST (SAFETY) ×3 IMPLANT
COVER WAND RF STERILE (DRAPES) IMPLANT
GLOVE BIOGEL M STRL SZ7.5 (GLOVE) ×7 IMPLANT
GOWN STRL REUS W/TWL LRG LVL3 (GOWN DISPOSABLE) ×5 IMPLANT
GOWN STRL REUS W/TWL XL LVL3 (GOWN DISPOSABLE) ×1 IMPLANT
GUIDEWIRE ANG ZIPWIRE 038X150 (WIRE) IMPLANT
GUIDEWIRE STR DUAL SENSOR (WIRE) ×3 IMPLANT
MANIFOLD NEPTUNE II (INSTRUMENTS) ×3 IMPLANT
PACK CYSTO (CUSTOM PROCEDURE TRAY) ×3 IMPLANT
STENT CONTOUR 6FRX24X.038 (STENTS) ×2 IMPLANT
TUBING CONNECTING 10 (TUBING) ×2 IMPLANT
TUBING CONNECTING 10' (TUBING) ×1

## 2018-10-03 NOTE — Anesthesia Preprocedure Evaluation (Addendum)
Anesthesia Evaluation  Patient identified by MRN, date of birth, ID band Patient awake    Reviewed: Allergy & Precautions, NPO status , Patient's Chart, lab work & pertinent test results  Airway Mallampati: II  TM Distance: >3 FB Neck ROM: Full    Dental  (+) Teeth Intact, Dental Advisory Given   Pulmonary former smoker,    breath sounds clear to auscultation       Cardiovascular  Rhythm:Regular Rate:Normal     Neuro/Psych    GI/Hepatic   Endo/Other    Renal/GU      Musculoskeletal   Abdominal   Peds  Hematology   Anesthesia Other Findings   Reproductive/Obstetrics                            Anesthesia Physical Anesthesia Plan  ASA: III and emergent  Anesthesia Plan: General   Post-op Pain Management:    Induction: Intravenous  PONV Risk Score and Plan: Ondansetron and Dexamethasone  Airway Management Planned: LMA  Additional Equipment:   Intra-op Plan:   Post-operative Plan:   Informed Consent: I have reviewed the patients History and Physical, chart, labs and discussed the procedure including the risks, benefits and alternatives for the proposed anesthesia with the patient or authorized representative who has indicated his/her understanding and acceptance.     Dental advisory given  Plan Discussed with: CRNA and Anesthesiologist  Anesthesia Plan Comments:         Anesthesia Quick Evaluation

## 2018-10-03 NOTE — Anesthesia Procedure Notes (Signed)
Procedure Name: LMA Insertion Date/Time: 10/03/2018 9:19 PM Performed by: Illene Silver, CRNA Pre-anesthesia Checklist: Patient identified, Emergency Drugs available, Suction available and Patient being monitored Patient Re-evaluated:Patient Re-evaluated prior to induction Oxygen Delivery Method: Circle system utilized Preoxygenation: Pre-oxygenation with 100% oxygen Induction Type: IV induction Ventilation: Mask ventilation without difficulty LMA: LMA with gastric port inserted LMA Size: 4.0 Tube type: Oral Number of attempts: 1 Placement Confirmation: ETT inserted through vocal cords under direct vision,  positive ETCO2 and breath sounds checked- equal and bilateral Tube secured with: Tape Dental Injury: Teeth and Oropharynx as per pre-operative assessment

## 2018-10-03 NOTE — Op Note (Signed)
Preoperative diagnosis: Urinary tract infection, left proximal ureteral stone Postoperative diagnosis: Same  Procedure: Cystoscopy, left retrograde pyelogram, left ureteral stent placement  Surgeon: Mena Goes  Anesthesia: General  Indication for procedure: 58 year old female with a history of dysuria and flank pain.  CT revealed a 12 mm left proximal ureteral stone causing mild hydronephrosis.  She has had fevers and hypotension.  She was brought for urgent stenting once a CT scan was obtained and read.  Findings: On exam under anesthesia the vaginal canal was short consistent with prior hysterectomy.  This was done because on cystoscopy there appeared to be possibility of fistula in the posterior bladder.  I did probe it with the wire and it was blind-ending.  Ureteral orifice ease were in the normal orthotopic position with clear efflux.  No stone or foreign body in the bladder.  Left retrograde pyelogram-on scout imaging the stone was noted in the left proximal ureter.  Left retrograde injection of contrast revealed a single ureter single collecting system unit with a large filling defect in the proximal ureter and mild proximal hydronephrosis of the collecting system.  Scription of procedure: After consent was obtained patient brought to the operating room.  After adequate anesthesia she is placed in lithotomy position and prepped and draped in the usual sterile fashion.  A timeout was performed to confirm the patient and procedure.  The cystoscope was passed per urethra and the bladder inspected.  There was a small cellule posteriorly with some surrounding erythema which look like possible fistula.  Vaginal canal was blind-ending and not connected.  I probed it with the wire and it seemed blind-ending.  A 6 French open-ended catheter was used to cannulate the left ureteral orifice and left retrograde injection of contrast was performed.  A sensor wire was advanced past the stone into the  collecting system and then a 624 stent advanced.  The wire was removed with a good coil noted in the left collecting system and a good coil in the bladder.  It looks like the stone was pushed back into the bladder.  The scope was removed and a 16 [place] Foley was placed in left to gravity drainage to max drain the system.  She was awakened and taken to the recovery room in stable condition.  Complications: None  Blood loss: Minimal  Specimens: None  Drains: 6 x 24 cm left ureteral stent  Disposition: PACU in stable condition

## 2018-10-03 NOTE — Discharge Instructions (Signed)
Ureteral Stent Implantation, Care After Refer to this sheet in the next few weeks. These instructions provide you with information about caring for yourself after your procedure. Your health care provider may also give you more specific instructions. Your treatment has been planned according to current medical practices, but problems sometimes occur. Call your health care provider if you have any problems or questions after your procedure.  Removal of the stent/stone: Be sure to follow-up with Dr. Mena Goes for removal of the stent/stone  What can I expect after the procedure? After the procedure, it is common to have:  Nausea.  Mild pain when you urinate. You may feel this pain in your lower back or lower abdomen. Pain should stop within a few minutes after you urinate. This may last for up to 1 week.  A small amount of blood in your urine for several days. Follow these instructions at home:  Medicines  Take over-the-counter and prescription medicines only as told by your health care provider.  If you were prescribed an antibiotic medicine, take it as told by your health care provider. Do not stop taking the antibiotic even if you start to feel better.  Do not drive for 24 hours if you received a sedative.  Do not drive or operate heavy machinery while taking prescription pain medicines. Activity  Return to your normal activities as told by your health care provider. Ask your health care provider what activities are safe for you.  Do not lift anything that is heavier than 10 lb (4.5 kg). Follow this limit for 1 week after your procedure, or for as long as told by your health care provider. General instructions  Watch for any blood in your urine. Call your health care provider if the amount of blood in your urine increases.  If you have a catheter: ? Follow instructions from your health care provider about taking care of your catheter and collection bag. ? Do not take baths, swim,  or use a hot tub until your health care provider approves.  Drink enough fluid to keep your urine clear or pale yellow.  Keep all follow-up visits as told by your health care provider. This is important. Contact a health care provider if:  You have pain that gets worse or does not get better with medicine, especially pain when you urinate.  You have difficulty urinating.  You feel nauseous or you vomit repeatedly during a period of more than 2 days after the procedure. Get help right away if:  Your urine is dark red or has blood clots in it.  You are leaking urine (have incontinence).  The end of the stent comes out of your urethra.  You cannot urinate.  You have sudden, sharp, or severe pain in your abdomen or lower back.  You have a fever. This information is not intended to replace advice given to you by your health care provider. Make sure you discuss any questions you have with your health care provider. Document Released: 05/07/2013 Document Revised: 02/10/2016 Document Reviewed: 03/19/2015 Elsevier Interactive Patient Education  2019 [employer].

## 2018-10-03 NOTE — Transfer of Care (Signed)
Immediate Anesthesia Transfer of Care Note  Patient: [name] [name]  Procedure(s) Performed: CYSTOSCOPY WITH RETROGRADE PYELOGRAM/URETERAL STENT PLACEMENT (Left )  Patient Location: PACU  Anesthesia Type:General  Level of Consciousness: awake, alert , oriented and patient cooperative  Airway & Oxygen Therapy: Patient Spontanous Breathing and Patient connected to face mask oxygen  Post-op Assessment: Report given to RN, Post -op Vital signs reviewed and stable and Patient moving all extremities X 4  Post vital signs: stable  Last Vitals:  Vitals Value Taken Time  BP 126/72 10/03/2018  9:52 PM  Temp    Pulse 68 10/03/2018  9:56 PM  Resp 19 10/03/2018  9:56 PM  SpO2 100 % 10/03/2018  9:56 PM  Vitals shown include unvalidated device data.  Last Pain:  Vitals:   10/03/18 2011  TempSrc: Oral  PainSc:       Patients Stated Pain Goal: 0 (10/02/18 1404)  Complications: No apparent anesthesia complications

## 2018-10-03 NOTE — Consult Note (Addendum)
Consult: left proximal ureteral stone, UTI, sepsis  Requested by: Dr. Shrey Purohit  History of Present Illness: 58 y.o. female  with SLE, ILD/pulm fibrosis, fibromylagia, hx of seizures and migraines who presented with 2 weeks of dysuria and progressive malaise and lower L flank pain. She's been hypotensiveLauretta Chester and required fluid boluses.   She's known about the stones and seen GU and [facility]. She also sees Rheum for SLE and Pulm for ILD/[facility]ul[facility]. She has h/o elevated PVR and CIC.   Past Medical History:  Diagnosis Date  . CHF (congestive heart failure) (HCC)   . Chronic kidney disease   . Chronic pain   . COPD (chronic obstructive pulmonary disease) (HCC)   . Fibromyalgia   . History of kidney stones   . Migraine   . Migraine without aura, with intractable migraine, so stated, without mention of status migrainosus 05/18/2014  . Peripheral vascular disease (HCC)   . Pneumonia   . Pulmonary fibrosis (HCC)   . Seizures (HCC)   . SLE (systemic lupus erythematosus) (HCC) 12/12/2017   Past Surgical History:  Procedure Laterality Date  . ABDOMINAL HYSTERECTOMY    . CHOLECYSTECTOMY    . Kidney stome surgery  2012  . Left lung biopsy  2015  . LUNG BIOPSY Left 11/21/2013   Procedure: LUNG BIOPSY;  Surgeon: Delight OvensEdward B Gerhardt, MD;  [facility];  Service: Thoracic;  Laterality: Left;  Marland Kitchen. VIDEO ASSISTED THORACOSCOPY Left 11/21/2013   Procedure: VIDEO ASSISTED THORACOSCOPY;  Surgeon: Delight OvensEdward B Gerhardt, MD;  [facility];  Service: Thoracic;  Laterality: Left;  Marland Kitchen. VIDEO BRONCHOSCOPY N/A 11/21/2013   Procedure: VIDEO BRONCHOSCOPY;  Surgeon: Delight OvensEdward B Gerhardt, MD;  [facility];  Service: Thoracic;  Laterality: N/A;    Home Medications:  Facility-Administered Medications Prior to Admission  Medication Dose Route Frequency Provider Last Rate Last Dose  . 0.9 %  sodium chloride infusion  500 mL Intravenous Continuous Meryl DareStark, Malcolm T, MD       Medications Prior to Admission   Medication Sig Dispense Refill Last Dose  . aspirin EC 81 MG tablet Take 81 mg by mouth daily.   Past Week at Unknown time  . atorvastatin (LIPITOR) 80 MG tablet Take 80 mg by mouth daily.   Past Week at Unknown time  . Cyanocobalamin (B-12) 2000 MCG TABS Take 2,000 mcg by mouth daily. 30 tablet 3 Past Week at Unknown time  . etodolac (LODINE) 500 MG tablet Take 1 tablet (500 mg total) by mouth 2 (two) times daily. 30 tablet 0 Past Week at Unknown time  . fentaNYL (DURAGESIC - DOSED MCG/HR) 50 MCG/HR Place 1 patch (50 mcg total) onto the skin every 3 (three) days. 5 patch 0 09/29/2018 at Unknown time  . folic acid (FOLVITE) 1 MG tablet Take 1 tablet (1 mg total) by mouth daily. 90 tablet 3 Past Week at Unknown time  . furosemide (LASIX) 20 MG tablet TAKE 1 TABLET(20 MG) BY MOUTH DAILY (Patient taking differently: Take 20 mg by mouth every Monday, Wednesday, and Friday. ) 90 tablet 0 Past Week at Unknown time  . hydroxychloroquine (PLAQUENIL) 200 MG tablet Take 200-400 mg by mouth See admin instructions. Take 200 mg by mouth every other day alternating with 400 mg (2 tablets) every other day   Past Week at Unknown time  . levETIRAcetam (KEPPRA) 500 MG tablet TAKE 1 TABLET(500 MG) BY MOUTH TWICE DAILY (Patient taking differently: Take 500 mg by mouth 2 (  two) times daily. ) 60 tablet 11 Past Week at Unknown time  . mycophenolate (MYFORTIC) 360 MG TBEC EC tablet Take 360-720 mg by mouth See admin instructions. Take 720mg  in the am and 360mg  in pm   09/28/2018 at Unknown time  . Naloxone HCl (EVZIO) 0.4 MG/0.4ML SOAJ Inject 0.4 mLs into the muscle as needed (overdose).    Never needed  . nepafenac (ILEVRO) 0.3 % ophthalmic suspension Place 1 drop into both eyes every other day.    Past Week at Unknown time  . OXYGEN Inhale 2 L/min into the lungs at bedtime.   09/29/2018 at Unknown time  . pantoprazole (PROTONIX) 40 MG tablet Take 1 tablet (40 mg total) by mouth 2 (two) times daily. 60 tablet 6 Past Week at  Unknown time  . polyethylene glycol (MIRALAX / GLYCOLAX) packet Take 17 g by mouth daily as needed for mild constipation or moderate constipation. 14 each 0 Past Week at Unknown time  . Potassium Chloride ER 20 MEQ TBCR Take 20 mEq by mouth daily. 30 tablet 6 Past Week at Unknown time  . prednisoLONE acetate (PRED FORTE) 1 % ophthalmic suspension Place 1 drop into the left eye 4 (four) times daily.   Past Month at Unknown time  . predniSONE (DELTASONE) 10 MG tablet Take 10 mg by mouth daily.   3 Past Week at Unknown time  . pregabalin (LYRICA) 300 MG capsule Take 300 mg by mouth 2 (two) times daily.   09/28/2018 at Unknown time  . ranitidine (ZANTAC) 150 MG capsule Take 150 mg by mouth 2 (two) times daily.   09/28/2018 at Unknown time  . rizatriptan (MAXALT) 10 MG tablet Take 1 tab at the onset of migraine.May repeat in 2 hours if needed. Not to exceed 2tabs/24 hours (Patient taking differently: Take 10 mg by mouth See admin instructions. Take 10 mg at the onset of migraine. May repeat in 2 hours if needed. Not to exceed 2 tabs/24 hours) 10 tablet 5 Past Month at Unknown time  . SYMBICORT 80-4.5 MCG/ACT inhaler Inhale 2 puffs into the lungs 2 (two) times daily.   1 Past Week at Unknown time  . topiramate (TOPAMAX) 100 MG tablet Take 1.5 tablets (150 mg total) by mouth daily. (Patient taking differently: Take 150 mg by mouth at bedtime. ) 45 tablet 5 09/28/2018 at Unknown time  . traMADol (ULTRAM) 50 MG tablet Take 1-2 tablets (50-100 mg total) by mouth every 4 (four) hours as needed (cough). (Patient taking differently: Take 50-100 mg by mouth every 6 (six) hours as needed for moderate pain. ) 84 tablet 0 09/28/2018 at Unknown time  . Vitamin D, Ergocalciferol, (DRISDOL) 50000 units CAPS capsule Take 1 capsule (50,000 Units total) by mouth every 7 (seven) days. (Patient taking differently: Take 50,000 Units by mouth every Saturday. ) 12 capsule 3 09/28/2018 at Unknown time  . atorvastatin (LIPITOR) 40 MG  tablet Take 1 tablet (40 mg total) by mouth daily. (Patient not taking: Reported on 10/01/2018) 30 tablet 6 Not Taking at Unknown time  . megestrol (MEGACE) 40 MG/ML suspension SHAKE LIQUID AND TAKE 10 ML(400 MG) BY MOUTH DAILY (Patient not taking: Reported on 07/16/2018) 480 mL 0 Not Taking at Unknown time   Allergies:  Allergies  Allergen Reactions  . Shellfish Allergy Anaphylaxis  . Pregabalin Swelling    Able to tolerate Lyrica (brand name only)  . Gabapentin Rash    Family History  Problem Relation Age of Onset  . Hypertension Mother   .  Hepatitis C Mother   . Diabetes Father   . Hypertension Father   . Bipolar disorder Brother   . Multiple sclerosis Sister    Social History:  reports that she quit smoking about 13 years ago. Her smoking use included cigarettes. She has a 40.00 pack-year smoking history. She has never used smokeless tobacco. She reports that she does not drink alcohol or use drugs.  ROS: A complete review of systems was performed.  All systems are negative except for pertinent findings as noted. Review of Systems  Genitourinary: Positive for dysuria.  All other systems reviewed and are negative.   Physical Exam:  Vital signs in last 24 hours: Temp:  [97.6 F (36.4 C)-98 F (36.7 C)] 98 F (36.7 C) (01/16 1407) Pulse Rate:  [55-64] 59 (01/16 1407) Resp:  [16-20] 16 (01/16 1407) BP: (81-121)/(50-72) 121/69 (01/16 1407) SpO2:  [99 %-100 %] 100 % (01/16 1407) General:  Alert and oriented, No acute distress HEENT: Normocephalic, atraumatic Cardiovascular: Regular rate and rhythm Lungs: Regular rate and effort Abdomen: Soft, nontender, nondistended, no abdominal masses Back: No CVA tenderness Extremities: No edema Neurologic: Grossly intact  Laboratory Data:  Results for orders placed or performed during the hospital encounter of 09/29/18 (from the past 24 hour(s))  CBC     Status: Abnormal   Collection Time: 10/03/18  3:31 AM  Result Value Ref  Range   WBC 6.9 4.0 - 10.5 K/uL   RBC 4.35 3.87 - 5.11 MIL/uL   Hemoglobin 10.8 (L) 12.0 - 15.0 g/dL   HCT 16.1 09.6 - 04.5 %   MCV 83.0 80.0 - 100.0 fL   MCH 24.8 (L) 26.0 - 34.0 pg   MCHC 29.9 (L) 30.0 - 36.0 g/dL   RDW 40.9 (H) 81.1 - 91.4 %   Platelets 68 (L) 150 - 400 K/uL   nRBC 0.0 0.0 - 0.2 %  Comprehensive metabolic panel     Status: Abnormal   Collection Time: 10/03/18  3:31 AM  Result Value Ref Range   Sodium 140 135 - 145 mmol/L   Potassium 5.0 3.5 - 5.1 mmol/L   Chloride 115 (H) 98 - 111 mmol/L   CO2 15 (L) 22 - 32 mmol/L   Glucose, Bld 157 (H) 70 - 99 mg/dL   BUN 16 6 - 20 mg/dL   Creatinine, Ser 7.82 0.44 - 1.00 mg/dL   Calcium 8.1 (L) 8.9 - 10.3 mg/dL   Total Protein 5.8 (L) 6.5 - 8.1 g/dL   Albumin 3.0 (L) 3.5 - 5.0 g/dL   AST 31 15 - 41 U/L   ALT 21 0 - 44 U/L   Alkaline Phosphatase 62 38 - 126 U/L   Total Bilirubin 0.6 0.3 - 1.2 mg/dL   GFR calc non Af Amer >60 >60 mL/min   GFR calc Af Amer >60 >60 mL/min   Anion gap 10 5 - 15  Magnesium     Status: Abnormal   Collection Time: 10/03/18  3:31 AM  Result Value Ref Range   Magnesium 2.6 (H) 1.7 - 2.4 mg/dL  Procalcitonin     Status: None   Collection Time: 10/03/18  3:31 AM  Result Value Ref Range   Procalcitonin 7.65 ng/mL   Recent Results (from the past 240 hour(s))  Urine culture     Status: None   Collection Time: 09/29/18  9:40 PM  Result Value Ref Range Status   Specimen Description   Final    URINE, RANDOM Performed at [facility], [address], [place], [place] [address]    Special Requests   Final    NONE Performed at [facility], [address] [place], [place] [address]    Culture   Final    Multiple bacterial morphotypes present, none predominant. Suggest appropriate recollection if clinically indicated.   Report Status 10/01/2018 FINAL  Final  Culture, blood (routine x 2)     Status: None (Preliminary result)   Collection Time: 10/01/18   5:07 PM  Result Value Ref Range Status   Specimen Description   Final    BLOOD RIGHT ANTECUBITAL Performed at [facility], [address], [place], [place] [address]    Special Requests   Final    BOTTLES DRAWN AEROBIC AND ANAEROBIC Blood Culture adequate volume Performed at [facility], [address] [place], [place] [address]    Culture   Final    NO GROWTH 2 DAYS Performed at [facility], [address], [place], [place] [address]    Report Status PENDING  Incomplete  Culture, blood (routine x 2)     Status: None (Preliminary result)   Collection Time: 10/01/18  5:12 PM  Result Value Ref Range Status   Specimen Description   Final    BLOOD RIGHT ARM Performed at [facility], [address], [place], [place] [address]    Special Requests   Final    BOTTLES DRAWN AEROBIC ONLY Blood Culture adequate volume Performed at [facility], [address] [place], [place] [address]    Culture   Final    NO GROWTH 2 DAYS Performed at [facility], [address], [place], [place] [address]    Report Status PENDING  Incomplete  Culture, blood (routine x 2)     Status: None (Preliminary result)   Collection Time: 10/02/18  7:55 AM  Result Value Ref Range Status   Specimen Description   Final    BLOOD LEFT ANTECUBITAL Performed at [facility], [address], [place], [place] [address]    Special Requests   Final    BOTTLES DRAWN AEROBIC AND ANAEROBIC Blood Culture adequate volume Performed at [facility], [address], [place], [place] [address]    Culture   Final    NO GROWTH < 24 HOURS Performed at [facility], [address] [place], [place] [address]    Report Status PENDING  Incomplete  Culture, blood (routine x 2)     Status: None (Preliminary result)   Collection Time: 10/02/18  8:01 AM  Result Value Ref Range Status   Specimen  Description   Final    BLOOD LEFT HAND Performed at [facility], [address] [place], [place] [address]    Special Requests   Final    BOTTLES DRAWN AEROBIC AND ANAEROBIC Blood Culture adequate volume Performed at [facility], [address] [place], [place] [address]    Culture   Final    NO GROWTH < 24 HOURS Performed at [facility], [address] [place], [place] [address]    Report Status PENDING  Incomplete   Creatinine: Recent Labs    09/29/18 1906 09/30/18 0018 09/30/18 0528 10/01/18 0601 10/02/18 0319 10/03/18 0331  CREATININE 1.02* 1.00 1.04* 1.09* 1.08* 0.91    Impression/Assessment/plan:  Left proximal ureteral stone/UTI- I discussed with the patient the nature,  potential benefits, risks and alternatives to cystoscopy, left RGP, left ureteral stent placement, including side effects of the proposed treatment, the likelihood of the patient achieving the goals of the procedure, and any potential problems that might occur during the procedure or recuperation. Discussed rationale for staged procedure. All questions answered. Patient elects to proceed.    Jerilee Field 10/03/2018, 8:04 PM

## 2018-10-03 NOTE — Anesthesia Postprocedure Evaluation (Signed)
Anesthesia Post Note  Patient: [name] [name]  Procedure(s) Performed: CYSTOSCOPY WITH RETROGRADE PYELOGRAM/URETERAL STENT PLACEMENT (Left )     Patient location during evaluation: PACU Anesthesia Type: General Level of consciousness: awake and alert Pain management: pain level controlled Vital Signs Assessment: post-procedure vital signs reviewed and stable Respiratory status: spontaneous breathing, nonlabored ventilation, respiratory function stable and patient connected to nasal cannula oxygen Cardiovascular status: blood pressure returned to baseline and stable Postop Assessment: no apparent nausea or vomiting Anesthetic complications: no    Last Vitals:  Vitals:   10/03/18 2152 10/03/18 2200  BP: 126/72 123/65  Pulse: 66 60  Resp: 15 14  Temp:  36.6 C  SpO2: 100% 100%    Last Pain:  Vitals:   10/03/18 2011  TempSrc: Oral  PainSc:                  Edeline Greening COKER

## 2018-10-03 NOTE — Progress Notes (Signed)
PROGRESS NOTE    [name] [name]  FXJ:[id number] DOB: 08-28-1961 DOA: 09/29/2018 PCP: Charlott Rakes, MD   Brief Narrative:  58 year old with past medical history relevant for systemic lupus erythematosus, HLD, chronic pain, hyperlipidemia, interstitial lung disease/pulmonary fibrosis, seizure disorder, migraines, fibromyalgia who presented with 2 weeks of dysuria, progressive malaise and left lower flank pain thought to be secondary to UTI and his now developed a fever of unclear etiology.   Assessment & Plan:   Active Problems:   UTI (urinary tract infection)   #) Fever/malaise/fatigue: Elevated procalcitonin strongly suggests bacterial infection rather than rheumatologic process underlying this.  She was hypotensive yesterday and so was given IV boluses and her antibiotics were broadened out. -Discontinue IV ceftriaxone 1 g daily started 09/29/2018 2 10/02/2018 -Continue IV vancomycin and cefepime started 10/02/2018 -Follow blood cultures ordered 10/01/2018 -Follow-up blood cultures ordered 10/02/2018 -Continue IV stress dose steroids 50 mg every 6 hours, will wean tomorrow -CT abdomen pelvis ordered due to vague abdominal pain  #) Lupus: Patient does not appear to be having flare.  ESR is only minimally elevated but CRP is quite elevated more in line with possible infection.  C3 and C4 pending. - C3, C4, anti-double-stranded DNA pending -Continue mycophenolate 720 mg every morning and 360 mg every afternoon -Continue hydroxychloroquine 200 mg 4 times daily, 40 mg 4 times daily -Discontinue home prednisone 10 mg daily -Started on IV stress dose steroids 50 mg every 6 hours  #) Hyperlipidemia: -Continue aspirin 81 mg -Daily atorvastatin 40 mg daily  #) Migraine: -Continue topiramate 250 mg nightly -Continue PRN triptan  #) Seizure disorder: -Continue levetiracetam 5 mg twice daily  #) Pain/psych: -Continue pregabalin 300 mg twice daily -Continue fentanyl patch  Tolerating  p.o. Electrolytes: Monitor and supplement Nutrition: Heart healthy diet  Prophylaxis: Enoxaparin  Disposition: Ending resolution of fevers  Full code    Consultants:   None  Procedures:   None  Antimicrobials:   IV ceftriaxone started 09/30/2018   Subjective: This morning the patient reports feeling much better.  She denies any nausea, vomiting, diarrhea but does have diffuse abdominal pain.  Her appetite is quite poor.  She is not any cough or congestion.  She does not have any chest pain.  She is not in any diarrhea.  Objective: Vitals:   10/02/18 2105 10/02/18 2219 10/02/18 2221 10/03/18 0514  BP:  (!) 86/50 (!) 81/59 108/72  Pulse:  (!) 58 64 (!) 55  Resp:  20  20  Temp:  97.6 F (36.4 C)  97.6 F (36.4 C)  TempSrc:  Oral  Oral  SpO2: 99% 100% 100% 100%  Weight:      Height:        Intake/Output Summary (Last 24 hours) at 10/03/2018 1027 Last data filed at 10/03/2018 0750 Gross per 24 hour  Intake 1587.44 ml  Output 852 ml  Net 735.44 ml   Filed Weights   09/29/18 1821 09/30/18 0815  Weight: 49.9 kg 53.3 kg    Examination:  General exam: Appears calm and comfortable  Respiratory system: No increased work of breathing, dry crackles bilaterally, no wheezes or rhonchi Cardiovascular system: Regular rate and rhythm, no murmurs Gastrointestinal system: Soft, nondistended, mildly tender to palpation, no rebound or guarding, plus bowel sounds. Central nervous system: Alert and oriented.  Grossly intact, moving all extremities Extremities: Trace lower extremity edema Skin: No rashes over visible skin Psychiatry: Judgement and insight appear normal. Mood & affect appropriate.     Data Reviewed: I  have personally reviewed following labs and imaging studies  CBC: Recent Labs  Lab 09/29/18 1906 09/30/18 0018 09/30/18 0528 10/01/18 0601 10/02/18 0319 10/03/18 0331  WBC 9.1 14.5* 18.9* 13.9* 9.4 6.9  NEUTROABS 8.0*  --  17.4* 12.3* 8.0*  --   HGB  11.5* 9.0* 10.0* 9.5* 9.1* 10.8*  HCT 39.1 31.5* 34.8* 32.9* 31.2* 36.1  MCV 84.4 88.0 87.4 88.9 85.7 83.0  PLT 153 99* 104* 96* 88* 68*   Basic Metabolic Panel: Recent Labs  Lab 09/29/18 1906 09/30/18 0018 09/30/18 0528 10/01/18 0601 10/02/18 0319 10/03/18 0331  NA 140  --  138 142 139 140  K 3.3*  --  3.1* 3.8 3.3* 5.0  CL 112*  --  114* 114* 113* 115*  CO2 19*  --  16* 20* 17* 15*  GLUCOSE 128*  --  114* 130* 123* 157*  BUN 15  --  13 21* 19 16  CREATININE 1.02* 1.00 1.04* 1.09* 1.08* 0.91  CALCIUM 8.3*  --  7.6* 8.3* 7.6* 8.1*  MG  --   --   --  1.9 1.5* 2.6*   GFR: Estimated Creatinine Clearance: 52.1 mL/min (by C-G formula based on SCr of 0.91 mg/dL). Liver Function Tests: Recent Labs  Lab 09/29/18 1906 10/03/18 0331  AST 17 31  ALT 8 21  ALKPHOS 97 62  BILITOT 0.5 0.6  PROT 6.1* 5.8*  ALBUMIN 3.6 3.0*   Recent Labs  Lab 09/29/18 1906  LIPASE 25   No results for input(s): AMMONIA in the last 168 hours. Coagulation Profile: No results for input(s): INR, PROTIME in the last 168 hours. Cardiac Enzymes: Recent Labs  Lab 09/29/18 1906  TROPONINI <0.03   BNP (last 3 results) No results for input(s): PROBNP in the last 8760 hours. HbA1C: No results for input(s): HGBA1C in the last 72 hours. CBG: No results for input(s): GLUCAP in the last 168 hours. Lipid Profile: No results for input(s): CHOL, HDL, LDLCALC, TRIG, CHOLHDL, LDLDIRECT in the last 72 hours. Thyroid Function Tests: No results for input(s): TSH, T4TOTAL, FREET4, T3FREE, THYROIDAB in the last 72 hours. Anemia Panel: No results for input(s): VITAMINB12, FOLATE, FERRITIN, TIBC, IRON, RETICCTPCT in the last 72 hours. Sepsis Labs: Recent Labs  Lab 10/02/18 1201 10/03/18 0331  PROCALCITON 14.49 7.65    Recent Results (from the past 240 hour(s))  Urine culture     Status: None   Collection Time: 09/29/18  9:40 PM  Result Value Ref Range Status   Specimen Description   Final    URINE,  RANDOM Performed at [facility] [address] [place], [place] [address]    Special Requests   Final    NONE Performed at [facility], [place] [address] [place], [place] [address]    Culture   Final    Multiple bacterial morphotypes present, none predominant. Suggest appropriate recollection if clinically indicated.   Report Status 10/01/2018 FINAL  Final  Culture, blood (routine x 2)     Status: None (Preliminary result)   Collection Time: 10/01/18  5:07 PM  Result Value Ref Range Status   Specimen Description   Final    BLOOD RIGHT ANTECUBITAL Performed at [facility] [address], [place], [place] [address]    Special Requests   Final    BOTTLES DRAWN AEROBIC AND ANAEROBIC Blood Culture adequate volume Performed at [facility] [address] [place], [place] [address]    Culture  Final    NO GROWTH 2 DAYS Performed at [facility], [place] [address] [place], [place] [address]    Report Status PENDING  Incomplete  Culture, blood (routine x 2)     Status: None (Preliminary result)   Collection Time: 10/01/18  5:12 PM  Result Value Ref Range Status   Specimen Description   Final    BLOOD RIGHT ARM Performed at [facility] [address], [place], [place] [address]    Special Requests   Final    BOTTLES DRAWN AEROBIC ONLY Blood Culture adequate volume Performed at [facility] [address], [place], [place] [address]    Culture   Final    NO GROWTH 2 DAYS Performed at [facility] [address], [place], [place] [address]    Report Status PENDING  Incomplete  Culture, blood (routine x 2)     Status: None (Preliminary result)   Collection Time: 10/02/18  7:55 AM  Result Value Ref Range Status   Specimen Description   Final    BLOOD LEFT ANTECUBITAL Performed at [facility] [address], [place], [place]  [address]    Special Requests   Final    BOTTLES DRAWN AEROBIC AND ANAEROBIC Blood Culture adequate volume Performed at [facility] [address], [place], [place] [address]    Culture   Final    NO GROWTH < 24 HOURS Performed at [facility] [address], [place], [place] [address]    Report Status PENDING  Incomplete  Culture, blood (routine x 2)     Status: None (Preliminary result)   Collection Time: 10/02/18  8:01 AM  Result Value Ref Range Status   Specimen Description   Final    BLOOD LEFT HAND Performed at [facility] [address] [place], [place] [address]    Special Requests   Final    BOTTLES DRAWN AEROBIC AND ANAEROBIC Blood Culture adequate volume Performed at [facility] [address], [place], [place] [address]    Culture   Final    NO GROWTH < 24 HOURS Performed at [facility] [address], [place], [place] [address]    Report Status PENDING  Incomplete         Radiology Studies: Dg Chest Port 1 View  Result Date: 10/02/2018 CLINICAL DATA:  Acute onset of fever. Personal history of LEFT lung biopsy. EXAM: PORTABLE CHEST 1 VIEW COMPARISON:  09/29/2018 and earlier, including CTA chest 03/04/2014. FINDINGS: Cardiomediastinal silhouette unremarkable and unchanged. Stable mild hyperinflation. Surgical suture material at the LEFT lung base and in the LEFT UPPER LOBE. Lungs clear. Bronchovascular markings normal. Pulmonary vascularity normal. No visible pleural effusions. No pneumothorax. IMPRESSION: No acute cardiopulmonary disease. Electronically Signed   By: Evangeline Dakin M.D.   On: 10/02/2018 11:42        Scheduled Meds: . enoxaparin (LOVENOX) injection  40 mg Subcutaneous Q24H  . famotidine  20 mg Oral QHS  . fentaNYL  50 mcg Transdermal Q72H  . hydrocortisone sod succinate (SOLU-CORTEF) inj  50 mg Intravenous Q6H  . hydroxychloroquine  200 mg Oral Q48H  .  hydroxychloroquine  400 mg Oral Q48H  . ibuprofen  400 mg Oral Once  . levETIRAcetam  500 mg Oral BID  . mometasone-formoterol  2 puff Inhalation BID  . mycophenolate  360 mg Oral QHS  . mycophenolate  720 mg  Oral q morning - 10a  . pantoprazole  40 mg Oral BID  . prednisoLONE acetate  1 drop Left Eye QID  . pregabalin  300 mg Oral BID  . topiramate  150 mg Oral QHS   Continuous Infusions: . sodium chloride 250 mL (10/03/18 0526)  . ceFEPime (MAXIPIME) IV 2 g (10/03/18 0529)  . vancomycin       LOS: 3 days    Time spent: [place], MD [facility]  If 7PM-7AM, please contact night-coverage [email or web address] Password [facility] 10/03/2018, 10:27 AM

## 2018-10-04 ENCOUNTER — Encounter (HOSPITAL_COMMUNITY): Payer: Self-pay | Admitting: Urology

## 2018-10-04 LAB — CBC
HCT: 34.6 % — ABNORMAL LOW (ref 36.0–46.0)
Hemoglobin: 10 g/dL — ABNORMAL LOW (ref 12.0–15.0)
MCH: 24.8 pg — ABNORMAL LOW (ref 26.0–34.0)
MCHC: 28.9 g/dL — ABNORMAL LOW (ref 30.0–36.0)
MCV: 85.9 fL (ref 80.0–100.0)
Platelets: 110 10*3/uL — ABNORMAL LOW (ref 150–400)
RBC: 4.03 MIL/uL (ref 3.87–5.11)
RDW: 16.2 % — ABNORMAL HIGH (ref 11.5–15.5)
WBC: 6.1 K/uL (ref 4.0–10.5)
nRBC: 0 % (ref 0.0–0.2)

## 2018-10-04 LAB — PROCALCITONIN: Procalcitonin: 5.43 ng/mL

## 2018-10-04 LAB — BASIC METABOLIC PANEL
Anion gap: 9 (ref 5–15)
BUN: 16 mg/dL (ref 6–20)
CO2: 18 mmol/L — ABNORMAL LOW (ref 22–32)
Calcium: 8.5 mg/dL — ABNORMAL LOW (ref 8.9–10.3)
Creatinine, Ser: 0.77 mg/dL (ref 0.44–1.00)
GFR calc non Af Amer: >60 mL/min (ref >60)
Glucose, Bld: 126 mg/dL — ABNORMAL HIGH (ref 70–99)
Potassium: 3.9 mmol/L (ref 3.5–5.1)
Sodium: 138 mmol/L (ref 135–145)

## 2018-10-04 LAB — BASIC METABOLIC PANEL WITH GFR
Chloride: 111 mmol/L (ref 98–111)
GFR calc Af Amer: >60 mL/min (ref >60)

## 2018-10-04 MED ORDER — SODIUM CHLORIDE 0.9 % IV BOLUS
500.0000 mL | Freq: Once | INTRAVENOUS | Status: AC
  Administered 2018-10-04: 500 mL via INTRAVENOUS

## 2018-10-04 MED ORDER — TRAMADOL HCL 50 MG PO TABS
50.0000 mg | ORAL_TABLET | ORAL | Status: DC | PRN
  Administered 2018-10-04 – 2018-10-05 (×4): 100 mg via ORAL
  Filled 2018-10-04 (×4): qty 2

## 2018-10-04 NOTE — Progress Notes (Signed)
Pharmacy Antibiotic Note  [name] [name] is a 59 y.o. female with an extenseive pmh including SLE admitted on 09/29/2018 with lower flank pain and fever of unknown etiology.  Pharmacy has been consulted for cefepime and vancomycin dosing. PCT elevated on day 3 abx (CTX).   Plan:  Continue Cefepime 2 g iv q 12 hours  Pharmacy will sign off, following peripherally for renal adjustments and culture results  Consider narrowing to PO tomorrow based on Urology recommendations  Height: 5\' 1"  (154.9 cm) Weight: 117 lb 8.1 oz (53.3 kg) IBW/kg (Calculated) : 47.8  Temp (24hrs), Avg:97.8 F (36.6 C), Min:97.5 F (36.4 C), Max:98 F (36.7 C)  Recent Labs  Lab 09/30/18 0528 10/01/18 0601 10/02/18 0319 10/03/18 0331 10/04/18 0321  WBC 18.9* 13.9* 9.4 6.9 6.1  CREATININE 1.04* 1.09* 1.08* 0.91 0.77    Estimated Creatinine Clearance: 59.3 mL/min (by C-G formula based on SCr of 0.77 mg/dL).    Allergies  Allergen Reactions  . Shellfish Allergy Anaphylaxis  . Pregabalin Swelling    Able to tolerate Lyrica (brand name only)  . Gabapentin Rash    Antimicrobials this admission: 1/12 CTX >> 1/15 1/15 cefepime >>  1/15 vanc >> 1/17 Dose adjustments this admission: Microbiology results: 1/14 BCx: NGTD 1/15 BCx: NGTD 1/12 UCx: ngf    Thank you for allowing pharmacy to be a part of this patient's care.  Bernadene Person, PharmD, BCPS [phone] 10/04/2018, 2:10 PM

## 2018-10-04 NOTE — Progress Notes (Addendum)
PT Cancellation Note  Patient Details Name: [name] [name] MRN: [id number] DOB: Dec 03, 1960   Cancelled Treatment:    Reason Eval/Treat Not Completed: Spoke with pt who declined to participate with PT at this time. Will check back as schedule allows.    Rebeca Alert, PT Acute Rehabilitation Services Pager: [phone] Office: [phone]

## 2018-10-04 NOTE — Progress Notes (Signed)
1 Day Post-Op Subjective: Patient reports Some bladder pressure.  Otherwise feels well.  Finished breakfast. Objective: Vital signs in last 24 hours: Temp:  [97.5 F (36.4 C)-98 F (36.7 C)] 97.9 F (36.6 C) (01/17 0503) Pulse Rate:  [48-66] 53 (01/17 0611) Resp:  [12-16] 12 (01/17 0503) BP: (99-126)/(60-74) 116/65 (01/17 0611) SpO2:  [96 %-100 %] 99 % (01/17 0907)  Intake/Output from previous day: 01/16 0701 - 01/17 0700 In: 1838 [P.O.:480; I.V.:1059.3; IV Piggyback:298.7] Out: 886 [Urine:881; Blood:5] Intake/Output this shift: Total I/O In: 240 [P.O.:240] Out: 150 [Urine:150]  Physical Exam:  NAD Watching TV Urine clear in tubing - light pink in bag   Lab Results: Recent Labs    10/02/18 0319 10/03/18 0331 10/04/18 0321  HGB 9.1* 10.8* 10.0*  HCT 31.2* 36.1 34.6*   BMET Recent Labs    10/03/18 0331 10/04/18 0321  NA 140 138  K 5.0 3.9  CL 115* 111  CO2 15* 18*  GLUCOSE 157* 126*  BUN 16 16  CREATININE 0.91 0.77  CALCIUM 8.1* 8.5*   No results for input(s): LABPT, INR in the last 72 hours. No results for input(s): LABURIN in the last 72 hours. Results for orders placed or performed during the hospital encounter of 09/29/18  Urine culture     Status: None   Collection Time: 09/29/18  9:40 PM  Result Value Ref Range Status   Specimen Description   Final    URINE, RANDOM Performed at [facility], [address] [place], [place] [address]    Special Requests   Final    NONE Performed at [facility], [address], [place], [place] [address]    Culture   Final    Multiple bacterial morphotypes present, none predominant. Suggest appropriate recollection if clinically indicated.   Report Status 10/01/2018 FINAL  Final  Culture, blood (routine x 2)     Status: None (Preliminary result)   Collection Time: 10/01/18  5:07 PM  Result Value Ref Range Status   Specimen Description   Final    BLOOD RIGHT  ANTECUBITAL Performed at [facility], [address], [place], [place] [address]    Special Requests   Final    BOTTLES DRAWN AEROBIC AND ANAEROBIC Blood Culture adequate volume Performed at [facility], [address] [place], [place] [address]    Culture   Final    NO GROWTH 3 DAYS Performed at [facility], [address], [place], [place] [address]    Report Status PENDING  Incomplete  Culture, blood (routine x 2)     Status: None (Preliminary result)   Collection Time: 10/01/18  5:12 PM  Result Value Ref Range Status   Specimen Description   Final    BLOOD RIGHT ARM Performed at [facility], [address] [place], [place] [address]    Special Requests   Final    BOTTLES DRAWN AEROBIC ONLY Blood Culture adequate volume Performed at [facility], [address] [place], [place] [address]    Culture   Final    NO GROWTH 3 DAYS Performed at [facility], [address] [place] [place], [place] [address]    Report Status PENDING  Incomplete  Culture, blood (routine x 2)     Status: None (Preliminary result)   Collection Time: 10/02/18  7:55 AM  Result Value Ref Range Status   Specimen Description   Final  BLOOD LEFT ANTECUBITAL Performed at [facility], [address] [place], [place] [address]    Special Requests   Final    BOTTLES DRAWN AEROBIC AND ANAEROBIC Blood Culture adequate volume Performed at [facility], [address] [place], [place] [address]    Culture   Final    NO GROWTH 2 DAYS Performed at [facility], [address] [place], [place] [address]    Report Status PENDING  Incomplete  Culture, blood (routine x 2)     Status: None (Preliminary result)   Collection Time: 10/02/18  8:01 AM  Result Value Ref Range Status   Specimen Description   Final    BLOOD LEFT HAND Performed at [facility], [address] [place], [place] [address]    Special Requests   Final    BOTTLES DRAWN AEROBIC AND ANAEROBIC Blood Culture adequate volume Performed at [facility], [address] [place], [place] [address]    Culture   Final    NO GROWTH 2 DAYS Performed at [facility], [address] [place], [place] [address]    Report Status PENDING  Incomplete    Studies/Results: Ct Abdomen Pelvis W Contrast  Result Date: 10/03/2018 CLINICAL DATA:  Lupus. Hyperlipidemia. Two weeks of dysuria. Left-sided pain. Fever and presumed urinary tract infection. Abscess suspected. EXAM: CT ABDOMEN AND PELVIS WITH CONTRAST TECHNIQUE: Multidetector CT imaging of the abdomen and pelvis was performed using the standard protocol following bolus administration of intravenous contrast. CONTRAST:  100mL OMNIPAQUE IOHEXOL 300 MG/ML  SOLN COMPARISON:  10/13/2016 abdominal ultrasound. Most recent CT 10/25/2013. FINDINGS: Lower chest: Left lung base scarring with surgical sutures. Normal heart size with trace bilateral pleural fluid. Hepatobiliary: Normal liver. Cholecystectomy, without biliary ductal dilatation. Pancreas: Normal, without mass or ductal dilatation. Spleen: Normal in size, without focal abnormality. Adrenals/Urinary Tract: Normal adrenal glands. Bilateral renal collecting system calculi on the order of 4-5 mm. Well-circumscribed low-density left renal lesions are likely cysts. Mild caliectasis secondary to a stone at the proximal left ureter which measures 9x12 mm on image 25/2 and image 35 coronal. Concurrent heterogeneous left renal enhancement, most apparent on delayed images. No distal hydroureter. Foley catheter decompresses the urinary bladder. Stomach/Bowel: Normal stomach, without wall thickening. Colonic stool burden suggests constipation. Normal terminal ileum and appendix. Normal small bowel. Vascular/Lymphatic: Advanced aortic and branch vessel atherosclerosis. Left common iliac  artery occlusion, including on image 38/2. This is chronic and the left femoral is reconstituted via collaterals. No abdominopelvic adenopathy. Reproductive: Hysterectomy.  No adnexal mass. Other: Small volume cul-de-sac fluid. No free intraperitoneal air. Mild anasarca. Musculoskeletal: Osteopenia. Mild superior endplate compression deformities at L4 and L1 are new since 2015. No ventral canal encroachment. IMPRESSION: 1. 9 x 12 mm stone at the left proximal ureter causing mild left-sided hydronephrosis. 2. Heterogeneous enhancement throughout the left kidney, suspicious for concurrent pyelonephritis. 3. Trace bilateral pleural effusions and small volume cul-de-sac fluid. Question fluid overload. 4. Aortic Atherosclerosis (ICD10-I70.0). Chronic occlusion of the left common iliac artery. Electronically Signed   By: Jeronimo GreavesKyle  Talbot M.D.   On: 10/03/2018 15:34   Dg Chest Port 1 View  Result Date: 10/02/2018 CLINICAL DATA:  Acute onset of fever. Personal history of LEFT lung biopsy. EXAM: PORTABLE CHEST 1 VIEW COMPARISON:  09/29/2018 and earlier, including CTA chest 03/04/2014. FINDINGS: Cardiomediastinal silhouette unremarkable and unchanged. Stable mild hyperinflation. Surgical suture material at the LEFT lung base and in the  LEFT UPPER LOBE. Lungs clear. Bronchovascular markings normal. Pulmonary vascularity normal. No visible pleural effusions. No pneumothorax. IMPRESSION: No acute cardiopulmonary disease. Electronically Signed   By: Hulan Saashomas  Lawrence M.D.   On: 10/02/2018 11:42   Dg C-arm 1-60 Min-no Report  Result Date: 10/03/2018 Fluoroscopy was utilized by the requesting physician.  No radiographic interpretation.    Assessment/Plan: 1) Left renal stone-placing the stent pushed the stone out of the proximal ureter back into the kidney.  I'll sign off, but set her up for cystoscopy, left ureteroscopy, laser lithotripsy and stent exchange in a few weeks.  Follow up  information on the chart for the  patient. Can d/c foley when UOP monitoring no longer needed.  2) UTI-urine culture mixed growth.  I'm OK with any of the empiric antibiotics such as cephalexin or Bactrim for 7-10 days .   LOS: 4 days   Jerilee FieldMatthew Olney Monier 10/04/2018, 9:31 AM

## 2018-10-04 NOTE — Evaluation (Signed)
Physical Therapy Evaluation Patient Details Name: [name] [name] MRN: [id number] DOB: [birth year] Today's Date: 10/04/2018   History of Present Illness  58 yo female admitted with UTI. S/P cystoscopy, L ureteral stent placement 1/16. Hx of fibromyalgia, Sz, migraines, ILF, pulm fibrosis, COPD, SLE.   Clinical Impression  On eval, pt required Min assist for mobility. She walked ~10 feet with a RW. Moderate pain with activity. Discussed d/c plan-pt stated she plans to return home with family assisting as needed. Will follow during hospital stay.     Follow Up Recommendations Home health PT;Supervision/Assistance - 24 hour    Equipment Recommendations  None recommended by PT    Recommendations for Other Services       Precautions / Restrictions Precautions Precautions: Fall Restrictions Weight Bearing Restrictions: No      Mobility  Bed Mobility Overal bed mobility: Needs Assistance Bed Mobility: Supine to Sit;Sit to Supine     Supine to sit: Min guard Sit to supine: Min assist   General bed mobility comments: Assist for LEs. Increased time.   Transfers Overall transfer level: Needs assistance Equipment used: Rolling walker (2 wheeled) Transfers: Sit to/from Stand Sit to Stand: From elevated surface;Min assist         General transfer comment: Assist to rise, stabilize, control descent. VCs safety, technique, hand placement. Increased time.   Ambulation/Gait Ambulation/Gait assistance: Min assist Gait Distance (Feet): 10 Feet Assistive device: Rolling walker (2 wheeled) Gait Pattern/deviations: Step-through pattern;Decreased stride length     General Gait Details: Slow gait speed. Assist to steady throughout distance. Limited distance due to pain  Stairs            Wheelchair Mobility    Modified Rankin (Stroke Patients Only)       Balance Overall balance assessment: Needs assistance         Standing balance support: Bilateral upper extremity  supported Standing balance-Leahy Scale: Poor                               Pertinent Vitals/Pain Pain Assessment: Faces Faces Pain Scale: Hurts even more Pain Location: abdomen Pain Descriptors / Indicators: Discomfort;Grimacing;Guarding Pain Intervention(s): Limited activity within patient's tolerance;Repositioned    Home Living Family/patient expects to be discharged to:: Private residence Living Arrangements: Other relatives;Alone(lives with sister and family) Available Help at Discharge: Family;Available PRN/intermittently Type of Home: House Home Access: Level entry     Home Layout: One level Home Equipment: Walker - 2 wheels;Cane - single point(O2)      Prior Function Level of Independence: Needs assistance   Gait / Transfers Assistance Needed: uses RW for ambulation     Comments: wears O2 @ night     Hand Dominance        Extremity/Trunk Assessment   Upper Extremity Assessment Upper Extremity Assessment: Generalized weakness    Lower Extremity Assessment Lower Extremity Assessment: Generalized weakness    Cervical / Trunk Assessment Cervical / Trunk Assessment: Kyphotic  Communication   Communication: No difficulties  Cognition Arousal/Alertness: Awake/alert Behavior During Therapy: WFL for tasks assessed/performed Overall Cognitive Status: Within Functional Limits for tasks assessed                                        General Comments      Exercises     Assessment/Plan    PT  Assessment Patient needs continued PT services  PT Problem List Decreased strength;Decreased balance;Decreased mobility;Decreased activity tolerance;Pain       PT Treatment Interventions DME instruction;Gait training;Functional mobility training;Therapeutic activities;Balance training;Patient/family education;Therapeutic exercise    PT Goals (Current goals can be found in the Care Plan section)  Acute Rehab PT Goals Patient Stated  Goal: less pain. home.  PT Goal Formulation: With patient Time For Goal Achievement: 10/18/18 Potential to Achieve Goals: Good    Frequency Min 3X/week   Barriers to discharge        Co-evaluation               AM-PAC PT "6 Clicks" Mobility  Outcome Measure Help needed turning from your back to your side while in a flat bed without using bedrails?: A Little Help needed moving from lying on your back to sitting on the side of a flat bed without using bedrails?: A Little Help needed moving to and from a bed to a chair (including a wheelchair)?: A Little Help needed standing up from a chair using your arms (e.g., wheelchair or bedside chair)?: A Little Help needed to walk in hospital room?: A Little Help needed climbing 3-5 steps with a railing? : Total 6 Click Score: 16    End of Session   Activity Tolerance: Patient limited by fatigue;Patient limited by pain Patient left: in bed;with call bell/phone within reach;with bed alarm set   PT Visit Diagnosis: Muscle weakness (generalized) (M62.81);Difficulty in walking, not elsewhere classified (R26.2);Pain Pain - part of body: (abdomen)    Time: 3295-1884 PT Time Calculation (min) (ACUTE ONLY): 20 min   Charges:   PT Evaluation $PT Eval Moderate Complexity: 1 Mod           Rebeca Alert, PT Acute Rehabilitation Services Pager: [phone] Office: [phone]

## 2018-10-04 NOTE — Progress Notes (Signed)
PROGRESS NOTE    [name] [name]  QIO:[id number] DOB: 1960-12-22 DOA: 09/29/2018 PCP: Charlott Rakes, MD   Brief Narrative:  58 year old with past medical history relevant for systemic lupus erythematosus, HLD, chronic pain, hyperlipidemia, interstitial lung disease/pulmonary fibrosis, seizure disorder, migraines, fibromyalgia who presented with 2 weeks of dysuria, progressive malaise and left lower flank pain thought to be secondary to UTI and his now developed a fever due to obstructed pyelonephritis.   Assessment & Plan:   Active Problems:   UTI (urinary tract infection)   #) Sepsis due to nephrolithiasis/hydronephrosis: Patient did have a stent placed on the left after CT scan came back positive for obstructing stone.  Patient is status post cystoscopy, left retrograde pyelogram and left ureteral stent placement on 10/03/2018. -Continue IVcefepime started 10/02/2018 -Discontinue IV vancomycin 10/02/2020 10/04/2018 - blood cultures ordered 10/01/2018 no growth to date -blood cultures ordered 10/02/2018 no growth to date -Wean IV stress dose steroids 20m every 8 hours, will wean tomorrow -Appreciate urology help  #) Lupus: Patient does not appear to be having flare.  ESR is only minimally elevated but CRP is quite elevated more in line with possible infection.   - C3, C4, anti-double-stranded DNA all normal -Continue mycophenolate 720 mg every morning and 360 mg every afternoon -Continue hydroxychloroquine 200 mg 4 times daily, 40 mg 4 times daily -Discontinue home prednisone 10 mg daily -Continue weaning IV stress to steroids  #) Hyperlipidemia: -Continue aspirin 81 mg -Daily atorvastatin 40 mg daily  #) Migraine: -Continue topiramate 250 mg nightly -Continue PRN triptan  #) Seizure disorder: -Continue levetiracetam 5 mg twice daily  #) Pain/psych: -Continue pregabalin 300 mg twice daily -Continue fentanyl patch  Tolerating p.o. Electrolytes: Monitor and  supplement Nutrition: Heart healthy diet  Prophylaxis: Enoxaparin  Disposition: Pending physical therapy evaluation  Full code    Consultants:   Urology  Procedures:   10/03/2018:Procedure: Cystoscopy, left retrograde pyelogram, left ureteral stent placement  Antimicrobials:   IV ceftriaxone started 09/30/2018 2 10/02/2018  IV vancomycin 10/02/2020 10/04/2018  IV cefepime 10/02/2020 ongoing   Subjective: This morning the patient reports feeling significantly better.  She does have some bloody drainage from her Foley but otherwise denies any nausea, vomiting, abdominal pain, cough, congestion.  Objective: Vitals:   10/03/18 2250 10/04/18 0503 10/04/18 0611 10/04/18 0907  BP: 106/60 99/67 116/65   Pulse: (!) 59 (!) 48 (!) 53   Resp: 14 12    Temp: (!) 97.5 F (36.4 C) 97.9 F (36.6 C)    TempSrc:  Oral    SpO2: 96% 100%  99%  Weight:      Height:        Intake/Output Summary (Last 24 hours) at 10/04/2018 1027 Last data filed at 10/04/2018 03244Gross per 24 hour  Intake 1837.96 ml  Output 885 ml  Net 952.96 ml   Filed Weights   09/29/18 1821 09/30/18 0815  Weight: 49.9 kg 53.3 kg    Examination:  General exam: Appears calm and comfortable  Respiratory system: No increased work of breathing, dry crackles bilaterally, no wheezes or rhonchi Cardiovascular system: Regular rate and rhythm, no murmurs Gastrointestinal system: Soft, nondistended, mildly tender to palpation, no rebound or guarding, plus bowel sounds. Central nervous system: Alert and oriented.  Grossly intact, moving all extremities Extremities: Trace lower extremity edema Skin: No rashes over visible skin Psychiatry: Judgement and insight appear normal. Mood & affect appropriate.     Data Reviewed: I have personally reviewed following labs and imaging studies  CBC: Recent Labs  Lab 09/29/18 1906  09/30/18 0528 10/01/18 0601 10/02/18 0319 10/03/18 0331 10/04/18 0321  WBC 9.1   < > 18.9*  13.9* 9.4 6.9 6.1  NEUTROABS 8.0*  --  17.4* 12.3* 8.0*  --   --   HGB 11.5*   < > 10.0* 9.5* 9.1* 10.8* 10.0*  HCT 39.1   < > 34.8* 32.9* 31.2* 36.1 34.6*  MCV 84.4   < > 87.4 88.9 85.7 83.0 85.9  PLT 153   < > 104* 96* 88* 68* 110*   < > = values in this interval not displayed.   Basic Metabolic Panel: Recent Labs  Lab 09/30/18 0528 10/01/18 0601 10/02/18 0319 10/03/18 0331 10/04/18 0321  NA 138 142 139 140 138  K 3.1* 3.8 3.3* 5.0 3.9  CL 114* 114* 113* 115* 111  CO2 16* 20* 17* 15* 18*  GLUCOSE 114* 130* 123* 157* 126*  BUN 13 21* '19 16 16  ' CREATININE 1.04* 1.09* 1.08* 0.91 0.77  CALCIUM 7.6* 8.3* 7.6* 8.1* 8.5*  MG  --  1.9 1.5* 2.6*  --    GFR: Estimated Creatinine Clearance: 59.3 mL/min (by C-G formula based on SCr of 0.77 mg/dL). Liver Function Tests: Recent Labs  Lab 09/29/18 1906 10/03/18 0331  AST 17 31  ALT 8 21  ALKPHOS 97 62  BILITOT 0.5 0.6  PROT 6.1* 5.8*  ALBUMIN 3.6 3.0*   Recent Labs  Lab 09/29/18 1906  LIPASE 25   No results for input(s): AMMONIA in the last 168 hours. Coagulation Profile: No results for input(s): INR, PROTIME in the last 168 hours. Cardiac Enzymes: Recent Labs  Lab 09/29/18 1906  TROPONINI <0.03   BNP (last 3 results) No results for input(s): PROBNP in the last 8760 hours. HbA1C: No results for input(s): HGBA1C in the last 72 hours. CBG: No results for input(s): GLUCAP in the last 168 hours. Lipid Profile: No results for input(s): CHOL, HDL, LDLCALC, TRIG, CHOLHDL, LDLDIRECT in the last 72 hours. Thyroid Function Tests: No results for input(s): TSH, T4TOTAL, FREET4, T3FREE, THYROIDAB in the last 72 hours. Anemia Panel: No results for input(s): VITAMINB12, FOLATE, FERRITIN, TIBC, IRON, RETICCTPCT in the last 72 hours. Sepsis Labs: Recent Labs  Lab 10/02/18 1201 10/03/18 0331 10/04/18 0321  PROCALCITON 14.49 7.65 5.43    Recent Results (from the past 240 hour(s))  Urine culture     Status: None    Collection Time: 09/29/18  9:40 PM  Result Value Ref Range Status   Specimen Description   Final    URINE, RANDOM Performed at [facility] [address] [place], [place] [address]    Special Requests   Final    NONE Performed at [facility], [place] [address] [place], [place] [address]    Culture   Final    Multiple bacterial morphotypes present, none predominant. Suggest appropriate recollection if clinically indicated.   Report Status 10/01/2018 FINAL  Final  Culture, blood (routine x 2)     Status: None (Preliminary result)   Collection Time: 10/01/18  5:07 PM  Result Value Ref Range Status   Specimen Description   Final    BLOOD RIGHT ANTECUBITAL Performed at [facility] [address], [place], [place] [address]    Special Requests   Final    BOTTLES DRAWN AEROBIC AND ANAEROBIC Blood Culture adequate volume Performed at [facility] [address] [place], [place] [address]  Culture   Final    NO GROWTH 3 DAYS Performed at [facility], [place] [address], [place], [place] [address]    Report Status PENDING  Incomplete  Culture, blood (routine x 2)     Status: None (Preliminary result)   Collection Time: 10/01/18  5:12 PM  Result Value Ref Range Status   Specimen Description   Final    BLOOD RIGHT ARM Performed at [facility] [address], [place], [place] [address]    Special Requests   Final    BOTTLES DRAWN AEROBIC ONLY Blood Culture adequate volume Performed at [facility] [address], [place], [place] [address]    Culture   Final    NO GROWTH 3 DAYS Performed at [facility], [place] [address] [place], [place] [address]    Report Status PENDING  Incomplete  Culture, blood (routine x 2)     Status: None (Preliminary result)   Collection Time: 10/02/18  7:55 AM  Result Value Ref Range Status   Specimen Description   Final     BLOOD LEFT ANTECUBITAL Performed at [facility] [address] [place], [place] [address]    Special Requests   Final    BOTTLES DRAWN AEROBIC AND ANAEROBIC Blood Culture adequate volume Performed at [facility] [address], [place], [place] [address]    Culture   Final    NO GROWTH 2 DAYS Performed at [facility] [address] [place], [place] [address]    Report Status PENDING  Incomplete  Culture, blood (routine x 2)     Status: None (Preliminary result)   Collection Time: 10/02/18  8:01 AM  Result Value Ref Range Status   Specimen Description   Final    BLOOD LEFT HAND Performed at [facility] [address], [place], [place] [address]    Special Requests   Final    BOTTLES DRAWN AEROBIC AND ANAEROBIC Blood Culture adequate volume Performed at [facility] [address], [place], [place] [address]    Culture   Final    NO GROWTH 2 DAYS Performed at [facility] [address] [place], [place] [address]    Report Status PENDING  Incomplete         Radiology Studies: Ct Abdomen Pelvis W Contrast  Result Date: 10/03/2018 CLINICAL DATA:  Lupus. Hyperlipidemia. Two weeks of dysuria. Left-sided pain. Fever and presumed urinary tract infection. Abscess suspected. EXAM: CT ABDOMEN AND PELVIS WITH CONTRAST TECHNIQUE: Multidetector CT imaging of the abdomen and pelvis was performed using the standard protocol following bolus administration of intravenous contrast. CONTRAST:  151m OMNIPAQUE IOHEXOL 300 MG/ML  SOLN COMPARISON:  10/13/2016 abdominal ultrasound. Most recent CT 10/25/2013. FINDINGS: Lower chest: Left lung base scarring with surgical sutures. Normal heart size with trace bilateral pleural fluid. Hepatobiliary: Normal liver. Cholecystectomy, without biliary ductal dilatation. Pancreas: Normal, without mass or ductal dilatation. Spleen: Normal in size, without focal  abnormality. Adrenals/Urinary Tract: Normal adrenal glands. Bilateral renal collecting system calculi on the order of 4-5 mm. Well-circumscribed low-density left renal lesions are likely cysts. Mild caliectasis secondary to a stone at the proximal left ureter which measures 9x12 mm on image 25/2 and image 35 coronal. Concurrent heterogeneous left renal enhancement, most apparent on delayed images. No distal hydroureter. Foley catheter decompresses the urinary bladder. Stomach/Bowel: Normal stomach, without wall thickening. Colonic stool burden suggests constipation. Normal  terminal ileum and appendix. Normal small bowel. Vascular/Lymphatic: Advanced aortic and branch vessel atherosclerosis. 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 No abdominopelvic adenopathy. Reproductive: Hysterectomy.  No adnexal mass. Other: Small volume cul-de-sac fluid. No free intraperitoneal air. Mild anasarca. Musculoskeletal: Osteopenia. Mild superior endplate compression deformities at L4 and L1 are new since 2015. No ventral canal encroachment. IMPRESSION: 1. 9 x 12 mm stone at the left proximal ureter causing mild left-sided hydronephrosis. 2. Heterogeneous enhancement throughout the left kidney, suspicious for concurrent pyelonephritis. 3. Trace bilateral pleural effusions and small volume cul-de-sac fluid. Question fluid overload. 4. Aortic Atherosclerosis (ICD10-I70.0). Chronic occlusion of the left common iliac artery. Electronically Signed   By: Abigail Miyamoto M.D.   On: 10/03/2018 15:34   Dg Chest Port 1 View  Result Date: 10/02/2018 CLINICAL DATA:  Acute onset of fever. Personal history of LEFT lung biopsy. EXAM: PORTABLE CHEST 1 VIEW COMPARISON:  09/29/2018 and earlier, including CTA chest 03/04/2014. FINDINGS: Cardiomediastinal silhouette unremarkable and unchanged. Stable mild hyperinflation. Surgical suture material at the LEFT lung  base and in the LEFT UPPER LOBE. Lungs clear. Bronchovascular markings normal. Pulmonary vascularity normal. No visible pleural effusions. No pneumothorax. IMPRESSION: No acute cardiopulmonary disease. Electronically Signed   By: Evangeline Dakin M.D.   On: 10/02/2018 11:42   Dg C-arm 1-60 Min-no Report  Result Date: 10/03/2018 Fluoroscopy was utilized by the requesting physician.  No radiographic interpretation.        Scheduled Meds: . enoxaparin (LOVENOX) injection  40 mg Subcutaneous Q24H  . famotidine  20 mg Oral QHS  . fentaNYL  50 mcg Transdermal Q72H  . hydrocortisone sod succinate (SOLU-CORTEF) inj  50 mg Intravenous Q6H  . hydroxychloroquine  200 mg Oral Q48H  . hydroxychloroquine  400 mg Oral Q48H  . ibuprofen  400 mg Oral Once  . levETIRAcetam  500 mg Oral BID  . mometasone-formoterol  2 puff Inhalation BID  . mycophenolate  360 mg Oral QHS  . mycophenolate  720 mg Oral q morning - 10a  . pantoprazole  40 mg Oral BID  . prednisoLONE acetate  1 drop Left Eye QID  . pregabalin  300 mg Oral BID  . topiramate  150 mg Oral QHS   Continuous Infusions: . sodium chloride 10 mL/hr at 10/04/18 0400  . ceFEPime (MAXIPIME) IV 2 g (10/04/18 0513)     LOS: 4 days    Time spent: [place], MD [facility]  If 7PM-7AM, please contact night-coverage [email or web address] Password [facility] 10/04/2018, 10:27 AM

## 2018-10-05 ENCOUNTER — Encounter (HOSPITAL_COMMUNITY): Payer: Self-pay | Admitting: Internal Medicine

## 2018-10-05 DIAGNOSIS — M3219 Other organ or system involvement in systemic lupus erythematosus: Secondary | ICD-10-CM

## 2018-10-05 DIAGNOSIS — G894 Chronic pain syndrome: Secondary | ICD-10-CM

## 2018-10-05 DIAGNOSIS — N132 Hydronephrosis with renal and ureteral calculous obstruction: Secondary | ICD-10-CM

## 2018-10-05 DIAGNOSIS — G40909 Epilepsy, unspecified, not intractable, without status epilepticus: Secondary | ICD-10-CM

## 2018-10-05 DIAGNOSIS — N10 Acute tubulo-interstitial nephritis: Secondary | ICD-10-CM

## 2018-10-05 LAB — BASIC METABOLIC PANEL
Anion gap: 9 (ref 5–15)
BUN: 22 mg/dL — ABNORMAL HIGH (ref 6–20)
CO2: 18 mmol/L — ABNORMAL LOW (ref 22–32)
Calcium: 8.6 mg/dL — ABNORMAL LOW (ref 8.9–10.3)
Creatinine, Ser: 1.14 mg/dL — ABNORMAL HIGH (ref 0.44–1.00)
GFR calc Af Amer: >60 mL/min (ref >60)
GFR calc non Af Amer: 54 mL/min — ABNORMAL LOW (ref >60)
Glucose, Bld: 125 mg/dL — ABNORMAL HIGH (ref 70–99)

## 2018-10-05 LAB — CBC
HCT: 37.2 % (ref 36.0–46.0)
Hemoglobin: 10.7 g/dL — ABNORMAL LOW (ref 12.0–15.0)
MCH: 24.5 pg — ABNORMAL LOW (ref 26.0–34.0)
MCHC: 28.8 g/dL — ABNORMAL LOW (ref 30.0–36.0)
MCV: 85.1 fL (ref 80.0–100.0)
Platelets: 146 10*3/uL — ABNORMAL LOW (ref 150–400)
RBC: 4.37 MIL/uL (ref 3.87–5.11)
RDW: 16.4 % — ABNORMAL HIGH (ref 11.5–15.5)
WBC: 5.3 10*3/uL (ref 4.0–10.5)
nRBC: 0 % (ref 0.0–0.2)

## 2018-10-05 LAB — BASIC METABOLIC PANEL WITH GFR
Chloride: 114 mmol/L — ABNORMAL HIGH (ref 98–111)
Potassium: 3.9 mmol/L (ref 3.5–5.1)
Sodium: 141 mmol/L (ref 135–145)

## 2018-10-05 MED ORDER — CEPHALEXIN 500 MG PO CAPS: 500.0000 mg | ORAL_CAPSULE | Freq: Four times a day (QID) | ORAL | 0 refills | Status: AC

## 2018-10-05 MED FILL — Fentanyl TD Patch 72HR 50 MCG/HR: TRANSDERMAL | Qty: 1 | Status: AC

## 2018-10-05 NOTE — Discharge Summary (Signed)
Physician Discharge Summary  [name] [name] ZOX:[id number] DOB: Feb 05, 1961 DOA: 09/29/2018  PCP: Hoy Register, MD  Admit date: 09/29/2018 Discharge date: 10/05/2018  Admitted From: home  Disposition: home with HHPT  Recommendations for Outpatient Follow-up:  1. Follow up with PCP in 4 weeks; follow up with urology Dr. Mena Goes in 2-3 weeks for cystoscopy, left ureteroscopy, laser lithotripsy and stent exchange and voiding trial  2. 7 more days of po Keflex  Home Health:yes Equipment/Devices:no Discharge Condition:stable CODE STATUS:full Diet recommendation: regular   Brief/Interim Summary: 58 year old with past medical history relevant for systemic lupus erythematosus, HLD, chronic pain and fibromyalgia (see pain clinic), hyperlipidemia, interstitial lung disease/pulmonary fibrosis, seizure disorder, migraines, who presented with 2 weeks of dysuria, progressive malaise and left lower flank pain. She's found to have sepsis due to acute pyelonephritis. CT abd/pelvis showed 9 x 12 mm stone at the left proximal ureter causing mild left-sided hydronephrosis; Heterogeneous enhancement throughout the left kidney, suspicious for concurrent pyelonephritis. Empirically started pt on iv Cefepime. So far both blood and urine culture remain negative since admission. Urology consulted,  placing the stent pushed the stone out of the proximal ureter back into the kidney. She will be set up for cystoscopy, left ureteroscopy, laser lithotripsy and stent exchange in a few weeks. Pt preferred to go home with foley, so she will need voiding trial at urology office as well. She will need 7 more days of po Keflex.  Pt has pain management appointment next Wednesday.  Pt is physically deconditioned and will go home with HHPT. She already has a walker and wheelchair at home.   Discharge Diagnoses:  Principal Problem:   Acute pyelonephritis Active Problems:   Chronic pain   Seizure disorder (HCC)   Fibromyalgia    History of renal calculi, multiple   BOOP (bronchiolitis obliterans with organizing pneumonia) (HCC)   SLE (systemic lupus erythematosus) (HCC)   Hydronephrosis with renal and ureteral calculus obstruction    Discharge Instructions  Discharge Instructions    Call MD for:  severe uncontrolled pain   Complete by:  As directed    Call MD for:  temperature >100.4   Complete by:  As directed    Diet - low sodium heart healthy   Complete by:  As directed    Discharge instructions   Complete by:  As directed    Routine foley catheter care at home; keep foley until you see urologist in office 7 more days of oral antibiotics Oral hydration at home   Increase activity slowly   Complete by:  As directed      Allergies as of 10/05/2018      Reactions   Shellfish Allergy Anaphylaxis   Pregabalin Swelling   Able to tolerate Lyrica (brand name only)   Gabapentin Rash      Medication List    TAKE these medications   aspirin EC 81 MG tablet Take 81 mg by mouth daily.   atorvastatin 40 MG tablet Commonly known as:  LIPITOR Take 1 tablet (40 mg total) by mouth daily. What changed:  Another medication with the same name was removed. Continue taking this medication, and follow the directions you see here.   B-12 2000 MCG Tabs Take 2,000 mcg by mouth daily.   cephALEXin 500 MG capsule Commonly known as:  KEFLEX Take 1 capsule (500 mg total) by mouth 4 (four) times daily for 7 days.   etodolac 500 MG tablet Commonly known as:  LODINE Take 1 tablet (500 mg total)  by mouth 2 (two) times daily.   EVZIO 0.4 MG/0.4ML Soaj Generic drug:  Naloxone HCl Inject 0.4 mLs into the muscle as needed (overdose).   fentaNYL 50 MCG/HR Commonly known as:  DURAGESIC Place 1 patch (50 mcg total) onto the skin every 3 (three) days.   folic acid 1 MG tablet Commonly known as:  FOLVITE Take 1 tablet (1 mg total) by mouth daily.   furosemide 20 MG tablet Commonly known as:  LASIX TAKE 1 TABLET(20  MG) BY MOUTH DAILY What changed:  See the new instructions.   ILEVRO 0.3 % ophthalmic suspension Generic drug:  nepafenac Place 1 drop into both eyes every other day.   levETIRAcetam 500 MG tablet Commonly known as:  KEPPRA TAKE 1 TABLET(500 MG) BY MOUTH TWICE DAILY What changed:    how much to take  how to take this  when to take this  additional instructions   megestrol 40 MG/ML suspension Commonly known as:  MEGACE SHAKE LIQUID AND TAKE 10 ML(400 MG) BY MOUTH DAILY   mycophenolate 360 MG Tbec EC tablet Commonly known as:  MYFORTIC Take 360-720 mg by mouth See admin instructions. Take 720mg  in the am and 360mg  in pm   OXYGEN Inhale 2 L/min into the lungs at bedtime.   pantoprazole 40 MG tablet Commonly known as:  PROTONIX Take 1 tablet (40 mg total) by mouth 2 (two) times daily.   PLAQUENIL 200 MG tablet Generic drug:  hydroxychloroquine Take 200-400 mg by mouth See admin instructions. Take 200 mg by mouth every other day alternating with 400 mg (2 tablets) every other day   polyethylene glycol packet Commonly known as:  MIRALAX / GLYCOLAX Take 17 g by mouth daily as needed for mild constipation or moderate constipation.   Potassium Chloride ER 20 MEQ Tbcr Take 20 mEq by mouth daily.   prednisoLONE acetate 1 % ophthalmic suspension Commonly known as:  PRED FORTE Place 1 drop into the left eye 4 (four) times daily.   predniSONE 10 MG tablet Commonly known as:  DELTASONE Take 10 mg by mouth daily.   pregabalin 300 MG capsule Commonly known as:  LYRICA Take 300 mg by mouth 2 (two) times daily.   ranitidine 150 MG capsule Commonly known as:  ZANTAC Take 150 mg by mouth 2 (two) times daily.   rizatriptan 10 MG tablet Commonly known as:  MAXALT Take 1 tab at the onset of migraine.May repeat in 2 hours if needed. Not to exceed 2tabs/24 hours What changed:    how much to take  how to take this  when to take this  additional instructions   SYMBICORT  80-4.5 MCG/ACT inhaler Generic drug:  budesonide-formoterol Inhale 2 puffs into the lungs 2 (two) times daily.   topiramate 100 MG tablet Commonly known as:  TOPAMAX Take 1.5 tablets (150 mg total) by mouth daily. What changed:  when to take this   traMADol 50 MG tablet Commonly known as:  ULTRAM Take 1-2 tablets (50-100 mg total) by mouth every 4 (four) hours as needed (cough). What changed:    when to take this  reasons to take this   Vitamin D (Ergocalciferol) 1.25 MG (50000 UT) Caps capsule Commonly known as:  DRISDOL Take 1 capsule (50,000 Units total) by mouth every 7 (seven) days. What changed:  when to take this      Follow-up Information    Call Jerilee FieldEskridge, Matthew, MD.   Specialty:  Urology Contact information: [address] [place] [id number] [phone]          Allergies  Allergen Reactions  . Shellfish Allergy Anaphylaxis  . Pregabalin Swelling    Able to tolerate Lyrica (brand name only)  . Gabapentin Rash    Consultations:  Urology Dr. Jerilee FieldMatthew Eskridge    Procedures/Studies: Dg Chest 2 View  Result Date: 09/29/2018 CLINICAL DATA:  Weakness, fever EXAM: CHEST - 2 VIEW COMPARISON:  CTA chest dated 03/04/2014 FINDINGS: Postsurgical changes in the left upper/mid lung and lateral left lung base. No focal consolidation. Right lung is clear. No pleural effusion or pneumothorax. The heart is normal in size. Visualized osseous structures are within normal limits. IMPRESSION: Postsurgical changes in the left hemithorax, as above. No evidence of acute cardiopulmonary disease. Electronically Signed   By: Charline BillsSriyesh  Krishnan M.D.   On: 09/29/2018 19:39   Ct Abdomen Pelvis W Contrast  Result Date: 10/03/2018 CLINICAL DATA:  Lupus. Hyperlipidemia. Two weeks of dysuria. Left-sided pain. Fever and presumed urinary tract infection. Abscess suspected. EXAM: CT ABDOMEN AND PELVIS WITH CONTRAST TECHNIQUE: Multidetector CT imaging of the abdomen and pelvis was  performed using the standard protocol following bolus administration of intravenous contrast. CONTRAST:  100mL OMNIPAQUE IOHEXOL 300 MG/ML  SOLN COMPARISON:  10/13/2016 abdominal ultrasound. Most recent CT 10/25/2013. FINDINGS: Lower chest: Left lung base scarring with surgical sutures. Normal heart size with trace bilateral pleural fluid. Hepatobiliary: Normal liver. Cholecystectomy, without biliary ductal dilatation. Pancreas: Normal, without mass or ductal dilatation. Spleen: Normal in size, without focal abnormality. Adrenals/Urinary Tract: Normal adrenal glands. Bilateral renal collecting system calculi on the order of 4-5 mm. Well-circumscribed low-density left renal lesions are likely cysts. Mild caliectasis secondary to a stone at the proximal left ureter which measures 9x12 mm on image 25/2 and image 35 coronal. Concurrent heterogeneous left renal enhancement, most apparent on delayed images. No distal hydroureter. Foley catheter decompresses the urinary bladder. Stomach/Bowel: Normal stomach, without wall thickening. Colonic stool burden suggests constipation. Normal terminal ileum and appendix. Normal small bowel. Vascular/Lymphatic: Advanced aortic and branch vessel atherosclerosis. 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 No abdominopelvic adenopathy. Reproductive: Hysterectomy.  No adnexal mass. Other: Small volume cul-de-sac fluid. No free intraperitoneal air. Mild anasarca. Musculoskeletal: Osteopenia. Mild superior endplate compression deformities at L4 and L1 are new since 2015. No ventral canal encroachment. IMPRESSION: 1. 9 x 12 mm stone at the left proximal ureter causing mild left-sided hydronephrosis. 2. Heterogeneous enhancement throughout the left kidney, suspicious for concurrent pyelonephritis. 3. Trace bilateral pleural effusions and small volume cul-de-sac fluid. Question fluid overload. 4. Aortic  Atherosclerosis (ICD10-I70.0). Chronic occlusion of the left common iliac artery. Electronically Signed   By: Jeronimo GreavesKyle  Talbot M.D.   On: 10/03/2018 15:34   Dg Chest Port 1 View  Result Date: 10/02/2018 CLINICAL DATA:  Acute onset of fever. Personal history of LEFT lung biopsy. EXAM: PORTABLE CHEST 1 VIEW COMPARISON:  09/29/2018 and earlier, including CTA chest 03/04/2014. FINDINGS: Cardiomediastinal silhouette unremarkable and unchanged. Stable mild hyperinflation. Surgical suture material at the LEFT lung base and in the LEFT UPPER LOBE. Lungs clear. Bronchovascular markings normal. Pulmonary vascularity normal. No visible pleural effusions. No pneumothorax. IMPRESSION: No acute cardiopulmonary disease. Electronically Signed   By: Hulan Saashomas  Lawrence M.D.   On: 10/02/2018 11:42   Dg C-arm 1-60 Min-no Report  Result Date: 10/03/2018 Fluoroscopy was utilized by the requesting physician.  No radiographic interpretation.       Subjective:  Discharge Exam: Vitals:   10/04/18 2137 10/05/18 0444  BP: 102/62 115/73  Pulse:  (!) 54  Resp:  20  Temp:  97.8 F (36.6 C)  SpO2:  94%   Vitals:   10/04/18 2010 10/04/18 2056 10/04/18 2137 10/05/18 0444  BP:  (!) 89/56 102/62 115/73  Pulse:  72  (!) 54  Resp:  18  20  Temp:  97.9 F (36.6 C)  97.8 F (36.6 C)  TempSrc:  Oral  Oral  SpO2: 97% 98%  94%  Weight:      Height:        General: Pt is alert, awake, not in acute distress Cardiovascular: RRR, S1/S2 +, no rubs, no gallops Respiratory: CTA bilaterally, no wheezing, no rhonchi Abdominal: Soft, NT, ND, bowel sounds + Extremities: no edema, no cyanosis    The results of significant diagnostics from this hospitalization (including imaging, microbiology, ancillary and laboratory) are listed below for reference.     Microbiology: Recent Results (from the past 240 hour(s))  Urine culture     Status: None   Collection Time: 09/29/18  9:40 PM  Result Value Ref Range Status   Specimen  Description   Final    URINE, RANDOM Performed at [facility], [address], [place], [place] [address]    Special Requests   Final    NONE Performed at [facility], [address], [place], [place] [address]    Culture   Final    Multiple bacterial morphotypes present, none predominant. Suggest appropriate recollection if clinically indicated.   Report Status 10/01/2018 FINAL  Final  Culture, blood (routine x 2)     Status: None (Preliminary result)   Collection Time: 10/01/18  5:07 PM  Result Value Ref Range Status   Specimen Description   Final    BLOOD RIGHT ANTECUBITAL Performed at [facility], [address] [place], [place] [address]    Special Requests   Final    BOTTLES DRAWN AEROBIC AND ANAEROBIC Blood Culture adequate volume Performed at [facility], [address], [place], [place] [address]    Culture   Final    NO GROWTH 3 DAYS Performed at [facility], [address], [place], [place] [address]    Report Status PENDING  Incomplete  Culture, blood (routine x 2)     Status: None (Preliminary result)   Collection Time: 10/01/18  5:12 PM  Result Value Ref Range Status   Specimen Description   Final    BLOOD RIGHT ARM Performed at [facility], [address], [place], [place] [address]    Special Requests   Final    BOTTLES DRAWN AEROBIC ONLY Blood Culture adequate volume Performed at [facility], [address], [place], [place] [address]    Culture   Final    NO GROWTH 3 DAYS Performed at [facility], [address], [place], [place] [address]    Report Status PENDING  Incomplete  Culture, blood (routine x 2)     Status: None (Preliminary result)   Collection Time: 10/02/18  7:55 AM  Result Value Ref Range Status   Specimen Description   Final    BLOOD LEFT ANTECUBITAL Performed at [facility], [address], [place], [place] [address]    Special Requests   Final    BOTTLES DRAWN AEROBIC AND ANAEROBIC Blood Culture adequate volume Performed at [facility], [address] [place], [place] [address]    Culture   Final    NO GROWTH 2 DAYS Performed at [facility], [address] [place], [place] [address]    Report Status PENDING  Incomplete  Culture, blood (routine x 2)     Status: None (Preliminary result)   Collection Time: 10/02/18  8:01 AM  Result Value Ref Range Status   Specimen Description   Final    BLOOD LEFT HAND Performed at [facility], [address], [place], [place] [address]    Special Requests   Final    BOTTLES DRAWN AEROBIC AND ANAEROBIC Blood Culture adequate volume Performed at [facility], [address], [place], [place] [address]    Culture   Final    NO GROWTH 2 DAYS Performed at [facility], [address], [place], [place] [address]    Report Status PENDING  Incomplete     Labs: BNP (last 3 results) No results for input(s): BNP in the last 8760 hours. Basic Metabolic Panel: Recent Labs  Lab 10/01/18 0601 10/02/18 0319 10/03/18 0331 10/04/18 0321 10/05/18 0303  NA 142 139 140 138 141  K 3.8 3.3* 5.0 3.9 3.9  CL 114* 113* 115* 111 114*  CO2 20* 17* 15* 18* 18*  GLUCOSE 130* 123* 157* 126* 125*  BUN 21* 19 16 16  22*  CREATININE 1.09* 1.08* 0.91 0.77 1.14*  CALCIUM 8.3* 7.6* 8.1* 8.5* 8.6*  MG 1.9 1.5* 2.6*  --   --    Liver Function Tests: Recent Labs  Lab 09/29/18 1906 10/03/18 0331  AST 17 31  ALT 8 21  ALKPHOS 97 62  BILITOT 0.5 0.6  PROT 6.1* 5.8*  ALBUMIN 3.6 3.0*   Recent Labs  Lab 09/29/18 1906  LIPASE 25   No results for input(s): AMMONIA in the last 168 hours. CBC: Recent Labs  Lab 09/29/18 1906  09/30/18 0528 10/01/18 0601 10/02/18 0319 10/03/18 0331 10/04/18 0321 10/05/18 0303  WBC 9.1   < > 18.9* 13.9* 9.4 6.9 6.1 5.3   NEUTROABS 8.0*  --  17.4* 12.3* 8.0*  --   --   --   HGB 11.5*   < > 10.0* 9.5* 9.1* 10.8* 10.0* 10.7*  HCT 39.1   < > 34.8* 32.9* 31.2* 36.1 34.6* 37.2  MCV 84.4   < > 87.4 88.9 85.7 83.0 85.9 85.1  PLT 153   < > 104* 96* 88* 68* 110* 146*   < > = values in this interval not displayed.   Cardiac Enzymes: Recent Labs  Lab 09/29/18 1906  TROPONINI <0.03   BNP: Invalid input(s): POCBNP CBG: No results for input(s): GLUCAP in the last 168 hours. D-Dimer No results for input(s): DDIMER in the last 72 hours. Hgb A1c No results for input(s): HGBA1C in the last 72 hours. Lipid Profile No results for input(s): CHOL, HDL, LDLCALC, TRIG, CHOLHDL, LDLDIRECT in the last 72 hours. Thyroid function studies No results for input(s): TSH, T4TOTAL, T3FREE, THYROIDAB in the last 72 hours.  Invalid input(s): FREET3 Anemia work up No results for input(s): VITAMINB12, FOLATE, FERRITIN, TIBC, IRON, RETICCTPCT in the last 72 hours. Urinalysis    Component Value Date/Time   COLORURINE YELLOW 09/29/2018 2027   APPEARANCEUR HAZY (A) 09/29/2018 2027   APPEARANCEUR Cloudy (A) 07/13/2017 1354   LABSPEC 1.014 09/29/2018 2027   PHURINE 7.0 09/29/2018 2027   GLUCOSEU NEGATIVE 09/29/2018 2027  HGBUR SMALL (A) 09/29/2018 2027   BILIRUBINUR NEGATIVE 09/29/2018 2027   BILIRUBINUR negative 03/19/2018 1717   BILIRUBINUR Negative 07/13/2017 1354   KETONESUR NEGATIVE 09/29/2018 2027   PROTEINUR 30 (A) 09/29/2018 2027   UROBILINOGEN 1.0 03/19/2018 1717   UROBILINOGEN 0.2 09/10/2014 1142   NITRITE NEGATIVE 09/29/2018 2027   LEUKOCYTESUR MODERATE (A) 09/29/2018 2027   LEUKOCYTESUR 2+ (A) 07/13/2017 1354   Sepsis Labs Invalid input(s): PROCALCITONIN,  WBC,  LACTICIDVEN Microbiology Recent Results (from the past 240 hour(s))  Urine culture     Status: None   Collection Time: 09/29/18  9:40 PM  Result Value Ref Range Status   Specimen Description   Final    URINE, RANDOM Performed at [facility], [address] [place], [place] [address]    Special Requests   Final    NONE Performed at [facility], [address] [place], [place] [address]    Culture   Final    Multiple bacterial morphotypes present, none predominant. Suggest appropriate recollection if clinically indicated.   Report Status 10/01/2018 FINAL  Final  Culture, blood (routine x 2)     Status: None (Preliminary result)   Collection Time: 10/01/18  5:07 PM  Result Value Ref Range Status   Specimen Description   Final    BLOOD RIGHT ANTECUBITAL Performed at [facility], [address] [place], [place] [address]    Special Requests   Final    BOTTLES DRAWN AEROBIC AND ANAEROBIC Blood Culture adequate volume Performed at [facility], [address], [place], [place] [address]    Culture   Final    NO GROWTH 3 DAYS Performed at [facility], [address] [place], [place] [address]    Report Status PENDING  Incomplete  Culture, blood (routine x 2)     Status: None (Preliminary result)   Collection Time: 10/01/18  5:12 PM  Result Value Ref Range Status   Specimen Description   Final    BLOOD RIGHT ARM Performed at [facility], [address] [place], [place] [address]    Special Requests   Final    BOTTLES DRAWN AEROBIC ONLY Blood Culture adequate volume Performed at [facility], [address] [place], [place] [address]    Culture   Final    NO GROWTH 3 DAYS Performed at [facility], [address], [place], [place] [address]    Report Status PENDING  Incomplete  Culture, blood (routine x 2)     Status: None (Preliminary result)   Collection Time: 10/02/18  7:55 AM  Result Value Ref Range Status   Specimen Description   Final    BLOOD LEFT ANTECUBITAL Performed at [facility], [address], [place], [place] [address]    Special Requests   Final     BOTTLES DRAWN AEROBIC AND ANAEROBIC Blood Culture adequate volume Performed at [facility], [address], [place], [place] [address]    Culture   Final    NO GROWTH 2 DAYS Performed at [facility], [address], [place], [place] [address]    Report Status PENDING  Incomplete  Culture, blood (routine x 2)     Status: None (Preliminary result)   Collection Time: 10/02/18  8:01 AM  Result Value Ref Range Status   Specimen Description   Final    BLOOD LEFT HAND Performed at  [facility], [address], [place], [place] [address]    Special Requests   Final    BOTTLES DRAWN AEROBIC AND ANAEROBIC Blood Culture adequate volume Performed at [facility], [address] [place], [place] [address]    Culture   Final    NO GROWTH 2 DAYS Performed at [facility], [address], [place], [place] [address]    Report Status PENDING  Incomplete     Time coordinating discharge: 41 minutes  SIGNED:   Gala Murdoch, MD  [facility] 10/05/2018, 11:51 AM Pager   If 7PM-7AM, please contact night-coverage [email or web address] Password [email or web address]

## 2018-10-06 LAB — CULTURE, BLOOD (ROUTINE X 2)
Culture: NO GROWTH
Culture: NO GROWTH
Special Requests: ADEQUATE
Special Requests: ADEQUATE

## 2018-10-07 LAB — CULTURE, BLOOD (ROUTINE X 2)
Culture: NO GROWTH
Culture: NO GROWTH
Special Requests: ADEQUATE
Special Requests: ADEQUATE

## 2018-10-15 ENCOUNTER — Other Ambulatory Visit: Payer: Self-pay | Admitting: Urology

## 2018-11-01 NOTE — Patient Instructions (Addendum)
[name] [name]  11/01/2018   Your procedure is scheduled on: 11/05/2018   Report to [facility]  Entrance  Report to admitting at   1115 AM    Call this number if you have problems the morning of surgery [phone]   Remember: Do not eat food :After Midnight. BRUSH YOUR TEETH MORNING OF SURGERY AND RINSE YOUR MOUTH OUT, NO CHEWING GUM CANDY OR MINTS.  May have clear liquids from 12 midnite until 0700am morning of surgery then nothing by mouth.     CLEAR LIQUID DIET   Foods Allowed                                                                     Foods Excluded  Coffee and tea, regular and decaf                             liquids that you cannot  Plain Jell-O in any flavor                                             see through such as: Fruit ices (not with fruit pulp)                                     milk, soups, orange juice  Iced Popsicles                                    All solid food Carbonated beverages, regular and diet                                    Cranberry, grape and apple juices Sports drinks like Gatorade Lightly seasoned clear broth or consume(fat free) Sugar, honey syrup  Sample Menu Breakfast                                Lunch                                     Supper Cranberry juice                    Beef broth                            Chicken broth Jell-O                                     Grape juice  Apple juice Coffee or tea                        Jell-O                                      Popsicle                                                Coffee or tea                        Coffee or tea  _____________________________________________________________________     Take these medicines the morning of surgery with A SIP OF WATER: Keppra, Lyrica, Protonix, eye drops as usual, Inhalers as usual and bring , Myfortic, Plaquenil                                 You may not have any  metal on your body including hair pins and              piercings  Do not wear jewelry, make-up, lotions, powders or perfumes, deodorant             Do not wear nail polish.  Do not shave  48 hours prior to surgery.                Do not bring valuables to the hospital. [facility] IS NOT             RESPONSIBLE   FOR VALUABLES.  Contacts, dentures or bridgework may not be worn into surgery.      Patients discharged the day of surgery will not be allowed to drive home. IF YOU ARE HAVING SURGERY AND GOING HOME THE SAME DAY, YOU MUST HAVE AN ADULT TO DRIVE YOU HOME AND BE WITH YOU FOR 24 HOURS. YOU MAY GO HOME BY TAXI OR UBER OR ORTHERWISE, BUT AN ADULT MUST ACCOMPANY YOU HOME AND STAY WITH YOU FOR 24 HOURS.  Name and phone number of your driver:               Please read over the following fact sheets you were given: _____________________________________________________________________             [facility] - Preparing for Surgery Before surgery, you can play an important role.  Because skin is not sterile, your skin needs to be as free of germs as possible.  You can reduce the number of germs on your skin by washing with CHG (chlorahexidine gluconate) soap before surgery.  CHG is an antiseptic cleaner which kills germs and bonds with the skin to continue killing germs even after washing. Please DO NOT use if you have an allergy to CHG or antibacterial soaps.  If your skin becomes reddened/irritated stop using the CHG and inform your nurse when you arrive at Short Stay. Do not shave (including legs and underarms) for at least 48 hours prior to the first CHG shower.  You may shave your face/neck. Please follow these instructions carefully:  1.  Shower with CHG Soap the night before surgery and the  morning of Surgery.  2.  If you choose  to wash your hair, wash your hair first as usual with your  normal  shampoo.  3.  After you shampoo, rinse your hair and body thoroughly to remove the   shampoo.                           4.  Use CHG as you would any other liquid soap.  You can apply chg directly  to the skin and wash                       Gently with a scrungie or clean washcloth.  5.  Apply the CHG Soap to your body ONLY FROM THE NECK DOWN.   Do not use on face/ open                           Wound or open sores. Avoid contact with eyes, ears mouth and genitals (private parts).                       Wash face,  Genitals (private parts) with your normal soap.             6.  Wash thoroughly, paying special attention to the area where your surgery  will be performed.  7.  Thoroughly rinse your body with warm water from the neck down.  8.  DO NOT shower/wash with your normal soap after using and rinsing off  the CHG Soap.                9.  Pat yourself dry with a clean towel.            10.  Wear clean pajamas.            11.  Place clean sheets on your bed the night of your first shower and do not  sleep with pets. Day of Surgery : Do not apply any lotions/deodorants the morning of surgery.  Please wear clean clothes to the hospital/surgery center.  FAILURE TO FOLLOW THESE INSTRUCTIONS MAY RESULT IN THE CANCELLATION OF YOUR SURGERY PATIENT SIGNATURE_________________________________  NURSE SIGNATURE__________________________________  ________________________________________________________________________ --------

## 2018-11-04 ENCOUNTER — Encounter (HOSPITAL_COMMUNITY)
Admission: RE | Admit: 2018-11-04 | Discharge: 2018-11-04 | Disposition: A | Discharge status: Home / Self Care | Payer: Medicaid Other | Source: Ambulatory Visit | Attending: Urology | Admitting: Urology

## 2018-11-04 ENCOUNTER — Encounter (HOSPITAL_COMMUNITY): Payer: Self-pay

## 2018-11-04 ENCOUNTER — Other Ambulatory Visit: Payer: Self-pay

## 2018-11-04 DIAGNOSIS — N200 Calculus of kidney: Secondary | ICD-10-CM | POA: Insufficient documentation

## 2018-11-04 DIAGNOSIS — Z01812 Encounter for preprocedural laboratory examination: Secondary | ICD-10-CM | POA: Diagnosis not present

## 2018-11-04 HISTORY — DX: Anemia, unspecified: D64.9

## 2018-11-04 HISTORY — DX: Other specified postprocedural states: Z98.890

## 2018-11-04 HISTORY — DX: Other specified postprocedural states: R11.2

## 2018-11-04 HISTORY — DX: Presence of other specified devices: Z97.8

## 2018-11-04 HISTORY — DX: Dependence on supplemental oxygen: Z99.81

## 2018-11-04 HISTORY — DX: Presence of urogenital implants: Z96.0

## 2018-11-04 LAB — BASIC METABOLIC PANEL
ANION GAP: 6 (ref 5–15)
BUN: 18 mg/dL (ref 6–20)
CO2: 24 mmol/L (ref 22–32)
Calcium: 9.1 mg/dL (ref 8.9–10.3)
Chloride: 111 mmol/L (ref 98–111)
Creatinine, Ser: 0.83 mg/dL (ref 0.44–1.00)
GFR calc Af Amer: >60 mL/min (ref >60)
GFR calc non Af Amer: >60 mL/min (ref >60)
Glucose, Bld: 81 mg/dL (ref 70–99)
POTASSIUM: 3.8 mmol/L (ref 3.5–5.1)
Sodium: 141 mmol/L (ref 135–145)

## 2018-11-04 LAB — CBC
HCT: 38.4 % (ref 36.0–46.0)
HEMOGLOBIN: 10.9 g/dL — AB (ref 12.0–15.0)
MCH: 25.7 pg — ABNORMAL LOW (ref 26.0–34.0)
MCHC: 28.4 g/dL — ABNORMAL LOW (ref 30.0–36.0)
MCV: 90.6 fL (ref 80.0–100.0)
Platelets: 238 10*3/uL (ref 150–400)
RBC: 4.24 MIL/uL (ref 3.87–5.11)
RDW: 16.5 % — ABNORMAL HIGH (ref 11.5–15.5)
WBC: 8 10*3/uL (ref 4.0–10.5)
nRBC: 0 % (ref 0.0–0.2)

## 2018-11-04 LAB — NO BLOOD PRODUCTS

## 2018-11-04 NOTE — Anesthesia Preprocedure Evaluation (Addendum)
Anesthesia Evaluation  Patient identified by MRN, date of birth, ID band Patient awake    Reviewed: Allergy & Precautions, NPO status , Patient's Chart, lab work & pertinent test results  Airway Mallampati: II  TM Distance: >3 FB Neck ROM: Full    Dental  (+) Dental Advisory Given   Pulmonary shortness of breath, pneumonia (BOOP), COPD, former smoker,    breath sounds clear to auscultation       Cardiovascular + Peripheral Vascular Disease and +CHF   Rhythm:Regular Rate:Normal     Neuro/Psych  Headaches, Seizures -,     GI/Hepatic Neg liver ROS, GERD  ,  Endo/Other  negative endocrine ROS  Renal/GU Renal disease     Musculoskeletal  (+) Fibromyalgia -SLE   Abdominal   Peds  Hematology  (+) anemia ,   Anesthesia Other Findings   Reproductive/Obstetrics                            Lab Results  Component Value Date   WBC 8.0 11/04/2018   HGB 10.9 (L) 11/04/2018   HCT 38.4 11/04/2018   MCV 90.6 11/04/2018   PLT 238 11/04/2018   Lab Results  Component Value Date   CREATININE 0.83 11/04/2018   BUN 18 11/04/2018   NA 141 11/04/2018   K 3.8 11/04/2018   CL 111 11/04/2018   CO2 24 11/04/2018    Anesthesia Physical Anesthesia Plan  ASA: III  Anesthesia Plan: General   Post-op Pain Management:    Induction: Intravenous  PONV Risk Score and Plan: 3 and Dexamethasone, Ondansetron and Treatment may vary due to age or medical condition  Airway Management Planned: LMA  Additional Equipment:   Intra-op Plan:   Post-operative Plan:   Informed Consent: I have reviewed the patients History and Physical, chart, labs and discussed the procedure including the risks, benefits and alternatives for the proposed anesthesia with the patient or authorized representative who has indicated his/her understanding and acceptance.     Dental advisory given  Plan Discussed with:  CRNA  Anesthesia Plan Comments:        Anesthesia Quick Evaluation

## 2018-11-04 NOTE — Progress Notes (Addendum)
EKG-09/30/18-epic  CXR-1V-epic- 10/02/18 Patient with hx of pulmonary fibrosis, lupus and PVD seen on preop.  Patient followed by DR Lynnea Ferrier ( Vasc Surg)  at [facility]- LOV- 09/19/18-epic- [facility]- PVD- needs either aortofemoral or femoral femoral bypass on left.   Followed by Dr at [facility] for lupus and Cardiologist- Dr Tami Ribas.   Followed by pulmonary- [facility] for pulmonary fibrosis. - Dr Marisa Sprinkles  [name], PA made aware of patient status at preop with sat 100% on room air, afebrile and issues above. Patient in no distress at preop appt.

## 2018-11-04 NOTE — Progress Notes (Signed)
Anesthesia Chart Review   Case:  [id number] Date/Time:  11/05/18 1300   Procedure:  CYSTOSCOPY/URETEROSCOPY/HOLMIUM LASER/STENT EXCHANGE (Left ) - ONLY NEEDS 60 MIN   Anesthesia type:  General   Pre-op diagnosis:  LEFT RENAL STONE   Location:  [facility] ROOM 05 / [facility]   Surgeon:  Jerilee Field, MD      DISCUSSION: 58 yo former smoker (40 pack years, quit 04/17/05) with h/o PONV, COPD (2L O2 at night prn), CHF, CKD, siezures, SLE, anemia, PVD, left renal stone scheduled for above procedure 11/05/18 with Dr. Jerilee Field.   Pt with left lower extremity claudication that will require either aortobifemoral bypass grafting or femorofemoral bypass.  Followed by Dr. Verne Grain with vascular surgery.  Per his last OV note on 10/09/18 pt would have to have kidney issues addressed prior to proceeding.  Pt hospitalized for sepsis due to pyelonephritis 09/29/18-10/05/18.    Clearance from pulmonologist, Dr. Marisa Sprinkles (on [facility]) states, "I estimate that her risk of post-operative pulmonary complications is somewhere between 6-13%. This would include hypoxemia, pneumonia, respiratory failure, and exacerbation of ILD or asthma. Fortunately her asthma and ILD symptoms have been stable and she has been on stable doses of prednisone and cellcept. I don't think there is anything pre-op I would recommend to further optimize her pulmonary status. Peri-op, the literature suggests avoidance of long acting neuromuscular blockade and avoidance of prolonged general anesthesia (less than 3 hours if possible). To the extent these are possible in this operation, they could be considered. To the extent avoidance of general anesthesia is possible too, in favor of a regional approach, that might also be considered.  Post-op I recommend aggressive pulmonary toileting (incentive spirometer, a cappela device prn), early mobility as tolerated, pharmacologic DVT prophylaxis, and attention to oxygenation (goal SpO2 92-95%). She has a  history of diastolic heart failure and would avoid aggressive fluid administration unless hypovolemic."  Pt can proceed with planned procedure barring acute status change.  VS: BP (!) 115/58   Pulse (!) 51   Temp 36.6 C (Oral)   Resp 16   Ht 5\' 1"  (1.549 m)   Wt 48.5 kg   BMI 20.22 kg/m   PROVIDERS: Hoy Register, MD is PCP   Anthoney Harada, MD is Vascular Surgeon  Gayleen Orem, MD is pulmonologist   Doss, Virgie Dad, MD is Cardiologist  LABS: Labs reviewed: Acceptable for surgery. (all labs ordered are listed, but only abnormal results are displayed)  Labs Reviewed  CBC - Abnormal; Notable for the following components:      Result Value   Hemoglobin 10.9 (*)    MCH 25.7 (*)    MCHC 28.4 (*)    RDW 16.5 (*)    All other components within normal limits  BASIC METABOLIC PANEL  NO BLOOD PRODUCTS     IMAGES: CT Abdomen Pelvis 10/03/2018 IMPRESSION: 1. 9 x 12 mm stone at the left proximal ureter causing mild left-sided hydronephrosis. 2. Heterogeneous enhancement throughout the left kidney, suspicious for concurrent pyelonephritis. 3. Trace bilateral pleural effusions and small volume cul-de-sac fluid. Question fluid overload. 4. Aortic Atherosclerosis (ICD10-I70.0). Chronic occlusion of the left common iliac artery.  EKG: 09/30/2018 Rate 93 bpm Sinus rhythm  Nonspecific intraventricular conduction delay   CV: Echo 11/03/2013 Study Conclusions  - Left ventricle: The cavity size was normal. There was mild focal basal hypertrophy of the septum. Systolic function was vigorous. The estimated ejection fraction was in the range of 65% to 70%. Wall motion  was normal; there were no regional wall motion abnormalities. Left ventricular diastolic function parameters were normal. - Aortic root: The aortic root was normal in size. - Mitral valve: No regurgitation. - Left atrium: The atrium was normal in size. - Right ventricle:  Systolic function was normal. - Right atrium: The atrium was normal in size. - Tricuspid valve: Moderate regurgitation. - Pulmonic valve: Mild regurgitation. - Pulmonary arteries: Systolic pressure was within the normal range. PA peak pressure: 31mm Hg (S). - Inferior vena cava: The vessel was normal in size. - Pericardium, extracardiac: There was no pericardial effusion. Past Medical History:  Diagnosis Date  . Anemia   . CHF (congestive heart failure) (HCC)   . Chronic kidney disease   . Chronic pain   . COPD (chronic obstructive pulmonary disease) (HCC)   . Fibromyalgia   . Foley catheter in place   . History of kidney stones   . Migraine   . Migraine without aura, with intractable migraine, so stated, without mention of status migrainosus 05/18/2014  . Oxygen dependent    as needed 2L at hs per patient   . Peripheral vascular disease (HCC)   . Pneumonia   . PONV (postoperative nausea and vomiting)   . Pulmonary fibrosis (HCC)   . Seizures (HCC)   . SLE (systemic lupus erythematosus) (HCC) 12/12/2017  . UTI (urinary tract infection) 09/30/2018    Past Surgical History:  Procedure Laterality Date  . ABDOMINAL HYSTERECTOMY    . CHOLECYSTECTOMY    . CYSTOSCOPY W/ URETERAL STENT PLACEMENT Left 10/03/2018   Procedure: CYSTOSCOPY WITH RETROGRADE PYELOGRAM/URETERAL STENT PLACEMENT;  Surgeon: Jerilee FieldEskridge, Matthew, MD;  Location: [facility];  Service: Urology;  Laterality: Left;  . EYE SURGERY     bilateral cataract surgery   . Kidney stome surgery  2012  . Left lung biopsy  2015  . LUNG BIOPSY Left 11/21/2013   Procedure: LUNG BIOPSY;  Surgeon: Delight OvensEdward B Gerhardt, MD;  [facility];  Service: Thoracic;  Laterality: Left;  Marland Kitchen. VIDEO ASSISTED THORACOSCOPY Left 11/21/2013   Procedure: VIDEO ASSISTED THORACOSCOPY;  Surgeon: Delight OvensEdward B Gerhardt, MD;  [facility];  Service: Thoracic;  Laterality: Left;  Marland Kitchen. VIDEO BRONCHOSCOPY N/A 11/21/2013   Procedure: VIDEO BRONCHOSCOPY;  Surgeon: Delight OvensEdward B  Gerhardt, MD;  [facility];  Service: Thoracic;  Laterality: N/A;    MEDICATIONS: . aspirin EC 81 MG tablet  . atorvastatin (LIPITOR) 40 MG tablet  . Cyanocobalamin (B-12) 2000 MCG TABS  . etodolac (LODINE) 500 MG tablet  . fentaNYL (DURAGESIC - DOSED MCG/HR) 50 MCG/HR  . folic acid (FOLVITE) 1 MG tablet  . furosemide (LASIX) 20 MG tablet  . hydroxychloroquine (PLAQUENIL) 200 MG tablet  . levETIRAcetam (KEPPRA) 500 MG tablet  . megestrol (MEGACE) 40 MG/ML suspension  . mycophenolate (MYFORTIC) 360 MG TBEC EC tablet  . Naloxone HCl (EVZIO) 0.4 MG/0.4ML SOAJ  . nepafenac (ILEVRO) 0.3 % ophthalmic suspension  . OXYGEN  . pantoprazole (PROTONIX) 40 MG tablet  . polyethylene glycol (MIRALAX / GLYCOLAX) packet  . Potassium Chloride ER 20 MEQ TBCR  . prednisoLONE acetate (PRED FORTE) 1 % ophthalmic suspension  . predniSONE (DELTASONE) 10 MG tablet  . pregabalin (LYRICA) 300 MG capsule  . ranitidine (ZANTAC) 150 MG capsule  . rizatriptan (MAXALT) 10 MG tablet  . SYMBICORT 80-4.5 MCG/ACT inhaler  . topiramate (TOPAMAX) 100 MG tablet  . traMADol (ULTRAM) 50 MG tablet  . Vitamin D, Ergocalciferol, (DRISDOL) 50000 units CAPS capsule   No current  facility-administered medications for this encounter.     Janey Genta [facility] Pre-Surgical Testing [phone] 11/04/18 4:00 PM

## 2018-11-04 NOTE — Progress Notes (Signed)
Blood Refusal consent form on chart and faxed to Dr Mena Goes. Also received fax confirmation and faxed to [facility] with confirmation

## 2018-11-04 NOTE — Progress Notes (Signed)
BMP and CBC done 11/04/2018 routed via epic to Dr Mena Goes.

## 2018-11-05 ENCOUNTER — Ambulatory Visit (HOSPITAL_COMMUNITY): Payer: Medicaid Other | Admitting: Physician Assistant

## 2018-11-05 ENCOUNTER — Encounter (HOSPITAL_COMMUNITY)
Admission: RE | Disposition: A | Discharge status: Home / Self Care | Payer: Self-pay | Source: Home / Self Care | Attending: Urology

## 2018-11-05 ENCOUNTER — Ambulatory Visit (HOSPITAL_COMMUNITY): Payer: Medicaid Other | Admitting: Certified Registered"

## 2018-11-05 ENCOUNTER — Encounter (HOSPITAL_COMMUNITY): Payer: Self-pay

## 2018-11-05 ENCOUNTER — Ambulatory Visit (HOSPITAL_COMMUNITY)
Admission: RE | Admit: 2018-11-05 | Discharge: 2018-11-05 | Disposition: A | Discharge status: Home / Self Care | Payer: Medicaid Other | Attending: Urology | Admitting: Urology

## 2018-11-05 ENCOUNTER — Ambulatory Visit (HOSPITAL_COMMUNITY): Payer: Medicaid Other

## 2018-11-05 DIAGNOSIS — Z79899 Other long term (current) drug therapy: Secondary | ICD-10-CM | POA: Insufficient documentation

## 2018-11-05 DIAGNOSIS — I509 Heart failure, unspecified: Secondary | ICD-10-CM | POA: Diagnosis not present

## 2018-11-05 DIAGNOSIS — M797 Fibromyalgia: Secondary | ICD-10-CM | POA: Insufficient documentation

## 2018-11-05 DIAGNOSIS — M329 Systemic lupus erythematosus, unspecified: Secondary | ICD-10-CM | POA: Insufficient documentation

## 2018-11-05 DIAGNOSIS — J449 Chronic obstructive pulmonary disease, unspecified: Secondary | ICD-10-CM | POA: Diagnosis not present

## 2018-11-05 DIAGNOSIS — N202 Calculus of kidney with calculus of ureter: Secondary | ICD-10-CM | POA: Insufficient documentation

## 2018-11-05 DIAGNOSIS — J8410 Pulmonary fibrosis, unspecified: Secondary | ICD-10-CM | POA: Insufficient documentation

## 2018-11-05 DIAGNOSIS — I739 Peripheral vascular disease, unspecified: Secondary | ICD-10-CM | POA: Diagnosis not present

## 2018-11-05 DIAGNOSIS — Z9981 Dependence on supplemental oxygen: Secondary | ICD-10-CM | POA: Insufficient documentation

## 2018-11-05 DIAGNOSIS — Z7982 Long term (current) use of aspirin: Secondary | ICD-10-CM | POA: Diagnosis not present

## 2018-11-05 DIAGNOSIS — R569 Unspecified convulsions: Secondary | ICD-10-CM | POA: Diagnosis not present

## 2018-11-05 DIAGNOSIS — N189 Chronic kidney disease, unspecified: Secondary | ICD-10-CM | POA: Diagnosis not present

## 2018-11-05 DIAGNOSIS — G43909 Migraine, unspecified, not intractable, without status migrainosus: Secondary | ICD-10-CM | POA: Diagnosis not present

## 2018-11-05 DIAGNOSIS — N200 Calculus of kidney: Secondary | ICD-10-CM

## 2018-11-05 HISTORY — PX: CYSTOSCOPY/URETEROSCOPY/HOLMIUM LASER/STENT PLACEMENT: SHX6546

## 2018-11-05 SURGERY — CYSTOSCOPY/URETEROSCOPY/HOLMIUM LASER/STENT PLACEMENT
Anesthesia: General | Laterality: Left

## 2018-11-05 MED ORDER — PHENYLEPHRINE 40 MCG/ML (10ML) SYRINGE FOR IV PUSH (FOR BLOOD PRESSURE SUPPORT)
PREFILLED_SYRINGE | INTRAVENOUS | Status: DC | PRN
  Administered 2018-11-05 (×3): 80 ug via INTRAVENOUS

## 2018-11-05 MED ORDER — FENTANYL CITRATE (PF) 100 MCG/2ML IJ SOLN
25.0000 ug | INTRAMUSCULAR | Status: DC | PRN
  Administered 2018-11-05 (×3): 50 ug via INTRAVENOUS

## 2018-11-05 MED ORDER — ACETAMINOPHEN 10 MG/ML IV SOLN
INTRAVENOUS | Status: AC
  Administered 2018-11-05: 1000 mg via INTRAVENOUS
  Filled 2018-11-05: qty 100

## 2018-11-05 MED ORDER — PROPOFOL 10 MG/ML IV BOLUS
INTRAVENOUS | Status: DC | PRN
  Administered 2018-11-05: 100 mg via INTRAVENOUS

## 2018-11-05 MED ORDER — ONDANSETRON HCL 4 MG/2ML IJ SOLN
INTRAMUSCULAR | Status: DC | PRN
  Administered 2018-11-05: 4 mg via INTRAVENOUS

## 2018-11-05 MED ORDER — DEXAMETHASONE SODIUM PHOSPHATE 10 MG/ML IJ SOLN
INTRAMUSCULAR | Status: DC | PRN
  Administered 2018-11-05: 10 mg via INTRAVENOUS

## 2018-11-05 MED ORDER — MIDAZOLAM HCL 2 MG/2ML IJ SOLN
INTRAMUSCULAR | Status: AC
  Filled 2018-11-05: qty 2

## 2018-11-05 MED ORDER — 0.9 % SODIUM CHLORIDE (POUR BTL) OPTIME
TOPICAL | Status: DC | PRN
  Administered 2018-11-05: 1000 mL

## 2018-11-05 MED ORDER — FENTANYL CITRATE (PF) 100 MCG/2ML IJ SOLN
INTRAMUSCULAR | Status: AC
  Filled 2018-11-05: qty 2

## 2018-11-05 MED ORDER — KETOROLAC TROMETHAMINE 15 MG/ML IJ SOLN
INTRAMUSCULAR | Status: AC
  Filled 2018-11-05: qty 1

## 2018-11-05 MED ORDER — LACTATED RINGERS IV SOLN
INTRAVENOUS | Status: DC
  Administered 2018-11-05: 12:00:00 via INTRAVENOUS

## 2018-11-05 MED ORDER — FENTANYL CITRATE (PF) 100 MCG/2ML IJ SOLN
INTRAMUSCULAR | Status: AC
  Administered 2018-11-05: 50 ug via INTRAVENOUS
  Filled 2018-11-05: qty 2

## 2018-11-05 MED ORDER — OXYCODONE HCL 5 MG PO TABS: 5.0000 mg | ORAL_TABLET | Freq: Once | ORAL | Status: DC

## 2018-11-05 MED ORDER — FENTANYL CITRATE (PF) 100 MCG/2ML IJ SOLN
INTRAMUSCULAR | Status: DC | PRN
  Administered 2018-11-05 (×2): 50 ug via INTRAVENOUS

## 2018-11-05 MED ORDER — KETOROLAC TROMETHAMINE 15 MG/ML IJ SOLN
15.0000 mg | Freq: Once | INTRAMUSCULAR | Status: AC
  Administered 2018-11-05: 15 mg via INTRAVENOUS

## 2018-11-05 MED ORDER — SODIUM CHLORIDE 0.9 % IR SOLN
Status: DC | PRN
  Administered 2018-11-05: 3000 mL via INTRAVESICAL

## 2018-11-05 MED ORDER — CEFAZOLIN SODIUM-DEXTROSE 2-4 GM/100ML-% IV SOLN
2.0000 g | Freq: Once | INTRAVENOUS | Status: AC
  Administered 2018-11-05: 2 g via INTRAVENOUS
  Filled 2018-11-05: qty 100

## 2018-11-05 MED ORDER — LIDOCAINE HCL (CARDIAC) PF 100 MG/5ML IV SOSY
PREFILLED_SYRINGE | INTRAVENOUS | Status: DC | PRN
  Administered 2018-11-05: 60 mg via INTRATRACHEAL

## 2018-11-05 MED ORDER — PROPOFOL 10 MG/ML IV BOLUS
INTRAVENOUS | Status: AC
  Filled 2018-11-05: qty 20

## 2018-11-05 MED ORDER — PHENYLEPHRINE 40 MCG/ML (10ML) SYRINGE FOR IV PUSH (FOR BLOOD PRESSURE SUPPORT)
PREFILLED_SYRINGE | INTRAVENOUS | Status: AC
  Filled 2018-11-05: qty 10

## 2018-11-05 MED ORDER — PROMETHAZINE HCL 25 MG/ML IJ SOLN: 6.2500 mg | INTRAMUSCULAR | Status: DC | PRN

## 2018-11-05 MED ORDER — ACETAMINOPHEN 10 MG/ML IV SOLN
1000.0000 mg | Freq: Once | INTRAVENOUS | Status: AC
  Administered 2018-11-05: 1000 mg via INTRAVENOUS

## 2018-11-05 MED ORDER — MIDAZOLAM HCL 2 MG/2ML IJ SOLN
INTRAMUSCULAR | Status: DC | PRN
  Administered 2018-11-05: 2 mg via INTRAVENOUS

## 2018-11-05 SURGICAL SUPPLY — 20 items
BAG URO CATCHER STRL LF (MISCELLANEOUS) ×3 IMPLANT
BASKET ZERO TIP NITINOL 2.4FR (BASKET) ×3 IMPLANT
CATH INTERMIT  6FR 70CM (CATHETERS) ×3 IMPLANT
CATH URET 5FR 28IN CONE TIP (BALLOONS)
CATH URET 5FR 70CM CONE TIP (BALLOONS) IMPLANT
CLOTH BEACON ORANGE TIMEOUT ST (SAFETY) IMPLANT
COVER WAND RF STERILE (DRAPES) IMPLANT
FIBER LASER TRAC TIP (UROLOGICAL SUPPLIES) ×3 IMPLANT
GLOVE BIO SURGEON STRL SZ7.5 (GLOVE) ×3 IMPLANT
GOWN STRL REUS W/TWL XL LVL3 (GOWN DISPOSABLE) ×3 IMPLANT
GUIDEWIRE STR DUAL SENSOR (WIRE) ×3 IMPLANT
GUIDEWIRE ZIPWRE .038 STRAIGHT (WIRE) ×3 IMPLANT
MANIFOLD NEPTUNE II (INSTRUMENTS) ×3 IMPLANT
PACK CYSTO (CUSTOM PROCEDURE TRAY) ×3 IMPLANT
SHEATH URETERAL 12FRX28CM (UROLOGICAL SUPPLIES) ×3 IMPLANT
SHEATH URETERAL 12FRX35CM (MISCELLANEOUS) IMPLANT
STENT URET 6FRX24 CONTOUR (STENTS) ×3 IMPLANT
TUBING CONNECTING 10 (TUBING) ×2 IMPLANT
TUBING CONNECTING 10' (TUBING) ×1
WIRE COONS/BENSON .038X145CM (WIRE) IMPLANT

## 2018-11-05 NOTE — Anesthesia Procedure Notes (Addendum)
Procedure Name: LMA Insertion Date/Time: 11/05/2018 1:37 PM Performed by: Donna Bernard, CRNA Pre-anesthesia Checklist: Patient identified, Emergency Drugs available, Suction available, Patient being monitored and Timeout performed Patient Re-evaluated:Patient Re-evaluated prior to induction Oxygen Delivery Method: Circle system utilized Preoxygenation: Pre-oxygenation with 100% oxygen Induction Type: IV induction Ventilation: Mask ventilation without difficulty LMA: LMA inserted LMA Size: 3.0 Tube secured with: Tape Dental Injury: Teeth and Oropharynx as per pre-operative assessment

## 2018-11-05 NOTE — H&P (Signed)
H&P  Chief Complaint: left proximal stone   History of Present Illness: [name] is a 58 year old female who underwent urgent left ureteral stent January 16 for a large 11 mm left proximal stone.  At the time she had dysuria and was hypotensive requiring boluses.  There was delayed enhancement of the left kidney.  Urine cultures ended up growing mixed growth.  She is been on Bactrim nightly given the stent and she also was discharged with a Foley catheter.  Urine is clear today.  She had no fever.  Appreciate her pulmonologist evaluation and clearance.  Past Medical History:  Diagnosis Date  . Anemia   . CHF (congestive heart failure) (HCC)   . Chronic kidney disease   . Chronic pain   . COPD (chronic obstructive pulmonary disease) (HCC)   . Fibromyalgia   . Foley catheter in place   . History of kidney stones   . Migraine   . Migraine without aura, with intractable migraine, so stated, without mention of status migrainosus 05/18/2014  . Oxygen dependent    as needed 2L at hs per patient   . Peripheral vascular disease (HCC)   . Pneumonia   . PONV (postoperative nausea and vomiting)   . Pulmonary fibrosis (HCC)   . Seizures (HCC)   . SLE (systemic lupus erythematosus) (HCC) 12/12/2017  . UTI (urinary tract infection) 09/30/2018   Past Surgical History:  Procedure Laterality Date  . ABDOMINAL HYSTERECTOMY    . CHOLECYSTECTOMY    . CYSTOSCOPY W/ URETERAL STENT PLACEMENT Left 10/03/2018   Procedure: CYSTOSCOPY WITH RETROGRADE PYELOGRAM/URETERAL STENT PLACEMENT;  Surgeon: Jerilee Field, MD;  Location: [facility];  Service: Urology;  Laterality: Left;  . EYE SURGERY     bilateral cataract surgery   . Kidney stome surgery  2012  . Left lung biopsy  2015  . LUNG BIOPSY Left 11/21/2013   Procedure: LUNG BIOPSY;  Surgeon: Delight Ovens, MD;  Location: [facility];  Service: Thoracic;  Laterality: Left;  Marland Kitchen VIDEO ASSISTED THORACOSCOPY Left 11/21/2013   Procedure: VIDEO ASSISTED THORACOSCOPY;   Surgeon: Delight Ovens, MD;  Location: [facility] OR;  Service: Thoracic;  Laterality: Left;  Marland Kitchen VIDEO BRONCHOSCOPY N/A 11/21/2013   Procedure: VIDEO BRONCHOSCOPY;  Surgeon: Delight Ovens, MD;  Location: [facility] OR;  Service: Thoracic;  Laterality: N/A;    Home Medications:  Medications Prior to Admission  Medication Sig Dispense Refill Last Dose  . atorvastatin (LIPITOR) 40 MG tablet Take 1 tablet (40 mg total) by mouth daily. 30 tablet 6 11/03/2018 at Unknown time  . Cyanocobalamin (B-12) 2000 MCG TABS Take 2,000 mcg by mouth daily. 30 tablet 3 Past Week at Unknown time  . etodolac (LODINE) 500 MG tablet Take 1 tablet (500 mg total) by mouth 2 (two) times daily. 30 tablet 0 Past Week at Unknown time  . fentaNYL (DURAGESIC - DOSED MCG/HR) 50 MCG/HR Place 1 patch (50 mcg total) onto the skin every 3 (three) days. 5 patch 0 Past Week at Unknown time  . folic acid (FOLVITE) 1 MG tablet Take 1 tablet (1 mg total) by mouth daily. 90 tablet 3 Past Week at Unknown time  . furosemide (LASIX) 20 MG tablet TAKE 1 TABLET(20 MG) BY MOUTH DAILY (Patient taking differently: Take 20 mg by mouth every Monday, Wednesday, and Friday. ) 90 tablet 0 Past Week at Unknown time  . hydroxychloroquine (PLAQUENIL) 200 MG tablet Take 200-400 mg by mouth See admin instructions. Take 200 mg by mouth every  other day alternating with 400 mg (2 tablets) every other day   11/04/2018 at Unknown time  . levETIRAcetam (KEPPRA) 500 MG tablet TAKE 1 TABLET(500 MG) BY MOUTH TWICE DAILY (Patient taking differently: Take 500 mg by mouth 2 (two) times daily. ) 60 tablet 11 11/05/2018 at 0600  . mycophenolate (MYFORTIC) 360 MG TBEC EC tablet Take 360-720 mg by mouth See admin instructions. Take 720mg  in the am and 360mg  in pm   11/05/2018 at 0600  . OXYGEN Inhale 2 L/min into the lungs at bedtime.   11/04/2018 at Unknown time  . pantoprazole (PROTONIX) 40 MG tablet Take 1 tablet (40 mg total) by mouth 2 (two) times daily. 60 tablet 6 11/04/2018 at  Unknown time  . polyethylene glycol (MIRALAX / GLYCOLAX) packet Take 17 g by mouth daily as needed for mild constipation or moderate constipation. 14 each 0 Past Week at Unknown time  . Potassium Chloride ER 20 MEQ TBCR Take 20 mEq by mouth daily. 30 tablet 6 Past Week at Unknown time  . prednisoLONE acetate (PRED FORTE) 1 % ophthalmic suspension Place 1 drop into the left eye 4 (four) times daily.   Past Week at Unknown time  . predniSONE (DELTASONE) 10 MG tablet Take 10 mg by mouth daily.   3 Past Week at Unknown time  . pregabalin (LYRICA) 300 MG capsule Take 300 mg by mouth 2 (two) times daily.   11/05/2018 at 0600  . ranitidine (ZANTAC) 150 MG capsule Take 150 mg by mouth 2 (two) times daily.   11/04/2018 at Unknown time  . rizatriptan (MAXALT) 10 MG tablet Take 1 tab at the onset of migraine.May repeat in 2 hours if needed. Not to exceed 2tabs/24 hours (Patient taking differently: Take 10 mg by mouth See admin instructions. Take 10 mg at the onset of migraine. May repeat in 2 hours if needed. Not to exceed 2 tabs/24 hours) 10 tablet 5 Past Month at Unknown time  . SYMBICORT 80-4.5 MCG/ACT inhaler Inhale 2 puffs into the lungs 2 (two) times daily.   1 11/04/2018 at Unknown time  . topiramate (TOPAMAX) 100 MG tablet Take 1.5 tablets (150 mg total) by mouth daily. (Patient taking differently: Take 150 mg by mouth at bedtime. ) 45 tablet 5 11/04/2018 at Unknown time  . traMADol (ULTRAM) 50 MG tablet Take 1-2 tablets (50-100 mg total) by mouth every 4 (four) hours as needed (cough). (Patient taking differently: Take 50-100 mg by mouth every 6 (six) hours as needed for moderate pain. ) 84 tablet 0 11/04/2018 at Unknown time  . Vitamin D, Ergocalciferol, (DRISDOL) 50000 units CAPS capsule Take 1 capsule (50,000 Units total) by mouth every 7 (seven) days. (Patient taking differently: Take 50,000 Units by mouth every Saturday. ) 12 capsule 3 Past Week at Unknown time  . aspirin EC 81 MG tablet Take 81 mg by  mouth daily.   10/31/2018  . megestrol (MEGACE) 40 MG/ML suspension SHAKE LIQUID AND TAKE 10 ML(400 MG) BY MOUTH DAILY (Patient not taking: Reported on 07/16/2018) 480 mL 0 Not Taking at Unknown time  . Naloxone HCl (EVZIO) 0.4 MG/0.4ML SOAJ Inject 0.4 mLs into the muscle as needed (overdose).    Never needed  . nepafenac (ILEVRO) 0.3 % ophthalmic suspension Place 1 drop into both eyes every other day.    Past Week at Unknown time   Allergies:  Allergies  Allergen Reactions  . Shellfish Allergy Anaphylaxis  . Other     Blood Product Refusal   .  Pregabalin Swelling    Able to tolerate Lyrica (brand name only)  . Gabapentin Rash    Family History  Problem Relation Age of Onset  . Hypertension Mother   . Hepatitis C Mother   . Diabetes Father   . Hypertension Father   . Bipolar disorder Brother   . Multiple sclerosis Sister    Social History:  reports that she quit smoking about 13 years ago. Her smoking use included cigarettes. She has a 40.00 pack-year smoking history. She has never used smokeless tobacco. She reports that she does not drink alcohol or use drugs.  ROS: A complete review of systems was performed.  All systems are negative except for pertinent findings as noted. Review of Systems  Genitourinary: Positive for hematuria.  All other systems reviewed and are negative.    Physical Exam:  Vital signs in last 24 hours: Temp:  [98.5 F (36.9 C)] 98.5 F (36.9 C) (02/18 1112) Pulse Rate:  [63] 63 (02/18 1112) Resp:  [15] 15 (02/18 1112) BP: (82-118)/(58-72) 118/72 (02/18 1229) SpO2:  [100 %] 100 % (02/18 1112) Weight:  [48.5 kg] 48.5 kg (02/18 1210) General:  Alert and oriented, No acute distress HEENT: Normocephalic, atraumatic Cardiovascular: Regular rate and rhythm Lungs: Regular rate and effort Abdomen: Soft, nontender, nondistended, no abdominal masses Back: No CVA tenderness Extremities: No edema Neurologic: Grossly intact  Laboratory Data:  No results  found for this or any previous visit (from the past 24 hour(s)). No results found for this or any previous visit (from the past 240 hour(s)). Creatinine: Recent Labs    11/04/18 1148  CREATININE 0.83    Impression/Assessment:  Left proximal ureteral stone and left renal stone-  Plan:  I discussed with the patient and her daughter the nature, potential benefits, risks and alternatives to cystoscopy, left ureteroscopy with laser lithotripsy and stent exchange, including side effects of the proposed treatment, the likelihood of the patient achieving the goals of the procedure, and any potential problems that might occur during the procedure or recuperation. All questions answered. Patient elects to proceed.    Jerilee Field 11/05/2018, 1:22 PM

## 2018-11-05 NOTE — Anesthesia Postprocedure Evaluation (Signed)
Anesthesia Post Note  Patient: [name] [name]  Procedure(s) Performed: CYSTOSCOPY/URETEROSCOPY/HOLMIUM LASER/STENT EXCHANGE (Left )     Patient location during evaluation: PACU Anesthesia Type: General Level of consciousness: awake and alert Pain management: pain level controlled Vital Signs Assessment: post-procedure vital signs reviewed and stable Respiratory status: spontaneous breathing, nonlabored ventilation, respiratory function stable and patient connected to nasal cannula oxygen Cardiovascular status: blood pressure returned to baseline and stable Postop Assessment: no apparent nausea or vomiting Anesthetic complications: no    Last Vitals:  Vitals:   11/05/18 1545 11/05/18 1600  BP: 119/67 116/62  Pulse: (!) 51 (!) 55  Resp: 14 14  Temp:  36.7 C  SpO2: 100% 100%    Last Pain:  Vitals:   11/05/18 1600  TempSrc:   PainSc: 8                  Kennieth Rad

## 2018-11-05 NOTE — Op Note (Signed)
Preoperative diagnosis: Left proximal ureteral stone, left renal stone Postoperative diagnosis: Left renal stones  Procedure: Cystoscopy with left ureteroscopy holmium laser lithotripsy, stone basket extraction and left ureteral stent exchange  Surgeon: Mena Goes  Anesthesia: General  Indication for procedure: [name] is a 58 year old female who underwent an urgent left ureteral stent for an 11 mm left proximal stone.  She was brought back today for definitive stone management.  Findings: On fluoroscopy the stent had knocked the stone into the left lower pole.  It was adjacent to the smaller left lower pole stone.  On ureteroscopy the stone was located in the left lower pole, the smaller more posterior stone could not be visualized with the scope either because it was at the edge of the calyx or may have been covered by mucosa.  The scope was max flexed and torqued and the stone was simply too posterior or not visible.  Otherwise the 11 mm stone dusted well and the largest fragments were removed.  Description of procedure: After consent was obtained patient brought to the operating room.  After adequate anesthesia she was placed lithotomy position.  The Foley catheter was removed.  She was prepped and draped in the usual sterile fashion.  A timeout was performed to confirm the patient and procedure.  The cystoscope was passed per urethra and the bladder irrigated several times.  The left ureteral stent was grasped and removed through the urethral meatus and a sensor wire was advanced and coiled in the collecting system.  I then used a short access sheath to place 2 wires and went adjacent to the Glidewire as the safety wire.  The dual channel digital ureteroscope was advanced to the kidney and the collecting system inspected.  It was quite a 180 turn to get to the lower pole so I basketed the large 11 mm stone and dropped it in the upper pole for the ease of fragmentation.  I looked down and tried to  find the other stone in the left lower pole but could not.  I move the basket around and ran some irrigation.  I then went back to the upper pole and used a 200 m laser fiber and at a setting of 0.3 and 50 and 0.8 and 10 the stone was dusted.  Several of the largest fragments were removed and collected.  The remaining fragments were dusted to 1 to 2 mm fragments.  The collecting system was inspected no other stones or large fragments were noted.  I tried to manipulate the lower pole again and the scope to find the other stone and could not.  Again collecting system inspected as well as the renal pelvis.  The access sheath and scope were backed out together and the ureter inspected noted to be without injury and no stone fragments.  The wire was backloaded on the cystoscope and a 6 x 24 cm stent was advanced.  The wire was removed with a partial coil seen in the upper pole calyx and a good coil in the bladder.  The bladder was drained and the scope removed.  The string was secured to the patient.  She was awakened and taken to the recovery room in stable condition.  Complications: None  Blood loss: Minimal  Specimens to patient/office lab-Stone fragments  Drains: 6 x 24 cm stent with string  Disposition: Patient stable to PACU

## 2018-11-05 NOTE — Discharge Instructions (Addendum)
Clean Intermittent Catheterization, Female  Clean intermittent catheterization (CIC) is a procedure to remove urine from the bladder by placing a small, flexible tube (catheter) into the bladder though the urethra. The urethra is a tube in the body that carries urine from the bladder out of the body. CIC may be done when:  You cannot completely empty your bladder on your own. This may be due to a blockage in the bladder or urethra.  Your bladder leaks urine. This may happen when the muscles or nerves near the bladder are not working normally, so the bladder overflows. Your health care provider will show you how to perform CIC and will help you to feel comfortable performing this procedure at home. Your health care provider will also help you to get the home care supplies that are needed for this procedure. What supplies will I need?  Germ-free (sterile), water-based lubricant.  A container for urine collection. You may also use the toilet to dispose of urine from the catheter.  A catheter. Your health care provider will determine the best size for you.  Sterile gloves.  Sterile gauze.  Medicated sterile swabs. How do I perform the procedure? Most people need CIC at least 4 times per dayto adequately empty the bladder. Your health care provider will tell you how often you should perform CIC. To perform CIC, follow these steps: 1. Wash your hands with soap and water. If soap and water are not available, use hand sanitizer. 2. Prepare the supplies that you will use during the procedure. Open the catheter pack, the lubricant, and the pack of medicated sterile swabs. If you have been told to keep the procedure sterile, do not touch your supplies until you are wearing gloves. 3. Get in a comfortable position. It may be helpful to use a handheld mirror to look at the opening of your urethra. Possible positions include: ? Sitting on a toilet, a chair, or the edge of a bed. ? Standing next to a  toilet with one foot on the toilet rim. ? Lying down with your head raised on pillows and your knees pointing to the ceiling. 4. If you are using a urine collection container, position it between your legs. 5. Urinate, if you are able. 6. Put on gloves. 7. Apply lubricant to about 2 inches (5 cm) of the tip of the catheter. 8. Set the catheter down on a clean, dry surface within reach. 9. Gently spread the folds of skin around your vagina (labia) with your non-dominant hand. For example, if you are right-handed, use your left hand to do this. With your other hand, clean the area around your urethra with medicated sterile swabs as told by your health care provider. 10. While keeping your labia spread apart, slowly insert the lubricated catheter 2-3 inches (5-8 cm) straight into your urethra until urine flows freely. Allow urine to drain into the toilet or the urine collection container. 11. When urine starts to flow freely, insert the catheter 1 inch (3 cm) more. 12. When urine stops flowing, slowly remove the catheter. 13. Note the color, amount, and odor of the urine. 14. Clean your labia using soap and water. 15. Wash your hands with soap and water. 16. Follow package instructions about how to clean the catheter after each use. What should I do at home? How Often Should I Perform CIC?  Do CIC to empty your bladder every 4-6 hours or as often as told by your health care provider.  If you  have symptoms of too much urine in your bladder (overdistension) and you are not able to urinate, perform CIC. Symptoms of overdistension may include: ? Restlessness. ? Sweating or chills. ? Headache. ? Flushedor pale skin. ? Cold limbs. ? Bloated lower abdomen. What Are Some Steps That I Can Take to Avoid Problems?  Drink enough fluid to keep your urine clear or pale yellow.  Avoid caffeine. Caffeine may make you urinate more frequently and more urgently.  Dispose of a multiple-use catheter when  it becomes dry, brittle, or cloudy. This usually happens after you use the catheter for 1 week.  Take over-the-counter and prescription medicines only as told by your health care provider.  Keep all follow-up visits as told by your health care provider. This is important. Contact a health care provider if:  You have difficulty performing CIC.  You have urine leaking during CIC.  You have: ? Dark or cloudy urine. ? Blood in your urine or in your catheter. ? A change in the smell of your urine or discharge. ? A burning feeling while you urinate.  You feel nauseous or you vomit.  You have pain in your abdomen, your back, or your sides below your ribs.  You have swelling or redness around the opening of your urethra.  You develop a rash or sores on your skin. Get help right away if:  You have a fever.  You have symptoms that do not go away after 3 days.  You have symptoms that suddenly get worse.  You have severe pain.  The amount of urine that drains from your bladder decreases. This information is not intended to replace advice given to you by your health care provider. Make sure you discuss any questions you have with your health care provider. Document Released: 10/07/2010 Document Revised: 08/17/2017 Document Reviewed: 03/19/2015 Elsevier Interactive Patient Education  2019 [employer]   Indwelling Urinary Catheter Care, Adult  Remove foley catheter and stent on Friday.  Perform in and out self catheterization as you have done in the past to empty your bladder.  An indwelling urinary catheter is a thin tube that is put into your bladder. The tube helps to drain pee (urine) out of your body. The tube goes in through your urethra. Your urethra is where pee comes out of your body. Your pee will come out through the catheter, then it will go into a bag (drainage bag). Take good care of your catheter so it will work well. How to wear your catheter and bag Supplies  needed  Sticky tape (adhesive tape) or a leg strap.  Alcohol wipe or soap and water (if you use tape).  A clean towel (if you use tape).  Large overnight bag.  Smaller bag (leg bag). Wearing your catheter Attach your catheter to your leg with tape or a leg strap.  Make sure the catheter is not pulled tight.  If a leg strap gets wet, take it off and put on a dry strap.  If you use tape to hold the bag on your leg: 1. Use an alcohol wipe or soap and water to wash your skin where the tape made it sticky before. 2. Use a clean towel to pat-dry that skin. 3. Use new tape to make the bag stay on your leg. Wearing your bags You should have been given a large overnight bag.  You may wear the overnight bag in the day or night.  Always have the overnight bag lower than your  bladder.  Do not let the bag touch the floor.  Before you go to sleep, put a clean plastic bag in a wastebasket. Then hang the overnight bag inside the wastebasket. You should also have a smaller leg bag that fits under your clothes.  Always wear the leg bag below your knee.  Do not wear your leg bag at night. How to care for your skin and catheter Supplies needed  A clean washcloth.  Water and mild soap.  A clean towel. Caring for your skin and catheter      Clean the skin around your catheter every day: ? Wash your hands with soap and water. ? Wet a clean washcloth in warm water and mild soap. ? Clean the skin around your urethra. ? If you are female: ? Gently spread the folds of skin around your vagina (labia). ? With the washcloth in your other hand, wipe the inner side of your labia on each side. Wipe from front to back. ? If you are female: ? Pull back any skin that covers the end of your penis (foreskin). ? With the washcloth in your other hand, wipe your penis in small circles. Start wiping at the tip of your penis, then move away from the catheter. ? With your free hand, hold the catheter  close to where it goes into your body. ? Keep holding the catheter during cleaning so it does not get pulled out. ? With the washcloth in your other hand, clean the catheter. ? Only wipe downward on the catheter. ? Do not wipe upward toward your body. Doing this may push germs into your urethra and cause infection. ? Use a clean towel to pat-dry the catheter and the skin around it. Make sure to wipe off all soap. ? Wash your hands with soap and water.  Shower every day. Do not take baths.  Do not use cream, ointment, or lotion on the area where the catheter goes into your body, unless your doctor tells you to.  Do not use powders, sprays, or lotions on your genital area.  Check your skin around the catheter every day for signs of infection. Check for: ? Redness, swelling, or pain. ? Fluid or blood. ? Warmth. ? Pus or a bad smell. How to empty the bag Supplies needed  Rubbing alcohol.  Gauze pad or cotton ball.  Tape or a leg strap. Emptying the bag Pour the pee out of your bag when it is ?- full, or at least 2-3 times a day. Do this for your overnight bag and your leg bag. 1. Wash your hands with soap and water. 2. Separate (detach) the bag from your leg. 3. Hold the bag over the toilet or a clean pail. Keep the bag lower than your hips and bladder. This is so the pee (urine) does not go back into the tube. 4. Open the pour spout. It is at the bottom of the bag. 5. Empty the pee into the toilet or pail. Do not let the pour spout touch any surface. 6. Put rubbing alcohol on a gauze pad or cotton ball. 7. Use the gauze pad or cotton ball to clean the pour spout. 8. Close the pour spout. 9. Attach the bag to your leg with tape or a leg strap. 10. Wash your hands with soap and water. Follow instructions for cleaning the drainage bag:  From the product maker.  As told by your doctor. How to change the bag Supplies needed  Alcohol  wipes.  A clean bag.  Tape or a leg  strap. Changing the bag Replace your bag with a clean bag once a month. If it starts to leak, smell bad, or look dirty, change it sooner. 1. Wash your hands with soap and water. 2. Separate the dirty bag from your leg. 3. Pinch the catheter with your fingers so that pee does not spill out. 4. Separate the catheter tube from the bag tube where these tubes connect (at the connection valve). Do not let the tubes touch any surface. 5. Clean the end of the catheter tube with an alcohol wipe. Use a different alcohol wipe to clean the end of the bag tube. 6. Connect the catheter tube to the tube of the clean bag. 7. Attach the clean bag to your leg with tape or a leg strap. Do not make the bag tight on your leg. 8. Wash your hands with soap and water. General rules   Never pull on your catheter. Never try to take it out. Doing that can hurt you.  Always wash your hands before and after you touch your catheter or bag. Use a mild, fragrance-free soap. If you do not have soap and water, use hand sanitizer.  Always make sure there are no twists or bends (kinks) in the catheter tube.  Always make sure there are no leaks in the catheter or bag.  Drink enough fluid to keep your pee pale yellow.  Do not take baths, swim, or use a hot tub.  If you are female, wipe from front to back after you poop (have a bowel movement). Contact a doctor if:  Your pee is cloudy.  Your pee smells worse than usual.  Your catheter gets clogged.  Your catheter leaks.  Your bladder feels full. Get help right away if:  You have redness, swelling, or pain where the catheter goes into your body.  You have fluid, blood, pus, or a bad smell coming from the area where the catheter goes into your body.  Your skin feels warm where the catheter goes into your body.  You have a fever.  You have pain in your: ? Belly (abdomen). ? Legs. ? Lower back. ? Bladder.  You see blood in the catheter.  Your pee is  pink or red.  You feel sick to your stomach (nauseous).  You throw up (vomit).  You have chills.  Your pee is not draining into the bag.  Your catheter gets pulled out. Summary  An indwelling urinary catheter is a thin tube that is placed into the bladder to help drain pee (urine) out of the body.  The catheter is placed into the part of the body that drains pee from the bladder (urethra).  Taking good care of your catheter will keep it working properly and help prevent problems.  Always wash your hands before and after touching your catheter or bag.  Never pull on your catheter or try to take it out. This information is not intended to replace advice given to you by your health care provider. Make sure you discuss any questions you have with your health care provider. Document Released: 12/30/2012 Document Revised: 02/25/2018 Document Reviewed: 04/20/2017 Elsevier Interactive Patient Education  2019 [employer] Ureteral Stent Implantation, Care After Refer to this sheet in the next few weeks. These instructions provide you with information about caring for yourself after your procedure. Your health care provider may also give you more specific instructions. Your treatment has been planned  according to current medical practices, but problems sometimes occur. Call your health care provider if you have any problems or questions after your procedure.  Removal of the stent: remove the stent by pulling the string with slow steady pressure on Friday morning, November 08, 2018  What can I expect after the procedure? After the procedure, it is common to have:  Nausea.  Mild pain when you urinate. You may feel this pain in your lower back or lower abdomen. Pain should stop within a few minutes after you urinate. This may last for up to 1 week.  A small amount of blood in your urine for several days. Follow these instructions at home:  Medicines  Take over-the-counter and  prescription medicines only as told by your health care provider.  If you were prescribed an antibiotic medicine, take it as told by your health care provider. Do not stop taking the antibiotic even if you start to feel better.  Do not drive for 24 hours if you received a sedative.  Do not drive or operate heavy machinery while taking prescription pain medicines. Activity  Return to your normal activities as told by your health care provider. Ask your health care provider what activities are safe for you.  Do not lift anything that is heavier than 10 lb (4.5 kg). Follow this limit for 1 week after your procedure, or for as long as told by your health care provider. General instructions  Watch for any blood in your urine. Call your health care provider if the amount of blood in your urine increases.  If you have a catheter: ? Follow instructions from your health care provider about taking care of your catheter and collection bag. ? Do not take baths, swim, or use a hot tub until your health care provider approves.  Drink enough fluid to keep your urine clear or pale yellow.  Keep all follow-up visits as told by your health care provider. This is important. Contact a health care provider if:  You have pain that gets worse or does not get better with medicine, especially pain when you urinate.  You have difficulty urinating.  You feel nauseous or you vomit repeatedly during a period of more than 2 days after the procedure. Get help right away if:  Your urine is dark red or has blood clots in it.  You are leaking urine (have incontinence).  The end of the stent comes out of your urethra.  You cannot urinate.  You have sudden, sharp, or severe pain in your abdomen or lower back.  You have a fever. This information is not intended to replace advice given to you by your health care provider. Make sure you discuss any questions you have with your health care provider. Document  Released: 05/07/2013 Document Revised: 02/10/2016 Document Reviewed: 03/19/2015 Elsevier Interactive Patient Education  2019 [employer]. Cystoscopy  Cystoscopy is a procedure that is used to help diagnose and sometimes treat conditions that affect that lower urinary tract. The lower urinary tract includes the bladder and the tube that drains urine from the bladder out of the body (urethra). Cystoscopy is performed with a thin, tube-shaped instrument with a light and camera at the end (cystoscope). The cystoscope may be hard (rigid) or flexible, depending on the goal of the procedure.The cystoscope is inserted through the urethra, into the bladder. Cystoscopy may be recommended if you have:  Urinary tractinfections that keep coming back (recurring).  Blood in the urine (hematuria).  Loss of  bladder control (urinary incontinence) or an overactive bladder.  Unusual cells found in a urine sample.  A blockage in the urethra.  Painful urination.  An abnormality in the bladder found during an intravenous pyelogram (IVP) or CT scan. Cystoscopy may also be done to remove a sample of tissue to be examined under a microscope (biopsy). Tell a health care provider about:  Any allergies you have.  All medicines you are taking, including vitamins, herbs, eye drops, creams, and over-the-counter medicines.  Any problems you or family members have had with anesthetic medicines.  Any blood disorders you have.  Any surgeries you have had.  Any medical conditions you have.  Whether you are pregnant or may be pregnant. What are the risks? Generally, this is a safe procedure. However, problems may occur, including:  Infection.  Bleeding.  Allergic reactions to medicines.  Damage to other structures or organs. What happens before the procedure?  Ask your health care provider about: ? Changing or stopping your regular medicines. This is especially important if you are taking diabetes  medicines or blood thinners. ? Taking medicines such as aspirin and ibuprofen. These medicines can thin your blood. Do not take these medicines before your procedure if your health care provider instructs you not to.  Follow instructions from your health care provider about eating or drinking restrictions.  You may be given antibiotic medicine to help prevent infection.  You may have an exam or testing, such as X-rays of the bladder, urethra, or kidneys.  You may have urine tests to check for signs of infection.  Plan to have someone take you home after the procedure. What happens during the procedure?  To reduce your risk of infection,your health care team will wash or sanitize their hands.  You will be given one or more of the following: ? A medicine to help you relax (sedative). ? A medicine to numb the area (local anesthetic).  The area around the opening of your urethra will be cleaned.  The cystoscope will be passed through your urethra into your bladder.  Germ-free (sterile)fluid will flow through the cystoscope to fill your bladder. The fluid will stretch your bladder so that your surgeon can clearly examine your bladder walls.  The cystoscope will be removed and your bladder will be emptied. The procedure may vary among health care providers and hospitals. What happens after the procedure?  You may have some soreness or pain in your abdomen and urethra. Medicines will be available to help you.  You may have some blood in your urine.  Do not drive for 24 hours if you received a sedative. This information is not intended to replace advice given to you by your health care provider. Make sure you discuss any questions you have with your health care provider. Document Released: 09/01/2000 Document Revised: 06/15/2017 Document Reviewed: 07/22/2015 Elsevier Interactive Patient Education  2019 [employer].

## 2018-11-05 NOTE — Transfer of Care (Signed)
Immediate Anesthesia Transfer of Care Note  Patient: [name] [name]  Procedure(s) Performed: CYSTOSCOPY/URETEROSCOPY/HOLMIUM LASER/STENT EXCHANGE (Left )  Patient Location: PACU  Anesthesia Type:General  Level of Consciousness: awake and patient cooperative  Airway & Oxygen Therapy: Patient connected to face mask oxygen  Post-op Assessment: Report given to RN and Post -op Vital signs reviewed and stable  Post vital signs: stable  Last Vitals:  Vitals Value Taken Time  BP 139/97 11/05/2018  2:40 PM  Temp    Pulse 56 11/05/2018  2:40 PM  Resp 17 11/05/2018  2:40 PM  SpO2 87 % 11/05/2018  2:40 PM  Vitals shown include unvalidated device data.  Last Pain:  Vitals:   11/05/18 1210  TempSrc:   PainSc: 9       Patients Stated Pain Goal: 3 (11/05/18 1210)  Complications: No apparent anesthesia complications

## 2018-11-06 ENCOUNTER — Encounter (HOSPITAL_COMMUNITY): Payer: Self-pay | Admitting: Urology

## 2018-12-11 ENCOUNTER — Ambulatory Visit: Payer: Medicaid Other | Attending: Family Medicine | Admitting: Family Medicine

## 2018-12-11 ENCOUNTER — Other Ambulatory Visit: Payer: Self-pay

## 2018-12-11 DIAGNOSIS — N3000 Acute cystitis without hematuria: Secondary | ICD-10-CM

## 2018-12-11 MED ORDER — CEPHALEXIN 500 MG PO CAPS: 500.0000 mg | ORAL_CAPSULE | Freq: Four times a day (QID) | ORAL | 0 refills | Status: DC

## 2018-12-11 NOTE — Progress Notes (Signed)
Subjective:  Patient ID: [name] [name], female    DOB: 1960-11-20  Age: 58 y.o. MRN: [id number]  CC: Urinary symptoms  HPI [name] [name] is a 58 year old female with a history of Fibromyalgia,systemic lupus erythematosus with interstitial lung disease, fibromyalgia, GERD, seizures, migraines who , recently underwent cystoscopy, left ureteroscopy with laser lithotripsy and stent exchange last month seen today for telephone encounter. Her SLE is managed by Rheumatology, Dr. Aundria Rud at [facility] she is followed by nephrology-Dr. Kathrene Bongo.  With regards to her lupus she is currently on Plaquenil, Myfortic and CellCept.  She complains of dysuria and presence of sediment in her urine with associated 'acid-like' smell ever since her Foley catheter was removed.  She states she had a Foley catheter in for 1 month post surgery and has been to see her surgeon here in [place] but does not like him and would like to follow-up with her urologist at [facility]. She denies nausea, vomiting, abdominal pain, flank pain.  Past Medical History:  Diagnosis Date  . Anemia   . CHF (congestive heart failure) (HCC)   . Chronic kidney disease   . Chronic pain   . COPD (chronic obstructive pulmonary disease) (HCC)   . Fibromyalgia   . Foley catheter in place   . History of kidney stones   . Migraine   . Migraine without aura, with intractable migraine, so stated, without mention of status migrainosus 05/18/2014  . Oxygen dependent    as needed 2L at hs per patient   . Peripheral vascular disease (HCC)   . Pneumonia   . PONV (postoperative nausea and vomiting)   . Pulmonary fibrosis (HCC)   . Seizures (HCC)   . SLE (systemic lupus erythematosus) (HCC) 12/12/2017  . UTI (urinary tract infection) 09/30/2018    Past Surgical History:  Procedure Laterality Date  . ABDOMINAL HYSTERECTOMY    . CHOLECYSTECTOMY    . CYSTOSCOPY W/ URETERAL STENT PLACEMENT Left 10/03/2018   Procedure: CYSTOSCOPY WITH  RETROGRADE PYELOGRAM/URETERAL STENT PLACEMENT;  Surgeon: Jerilee Field, MD;  Location: [facility]S;  Service: Urology;  Laterality: Left;  . CYSTOSCOPY/URETEROSCOPY/HOLMIUM LASER/STENT PLACEMENT Left 11/05/2018   Procedure: CYSTOSCOPY/URETEROSCOPY/HOLMIUM LASER/STENT EXCHANGE;  Surgeon: Jerilee Field, MD;  Location: [facility];  Service: Urology;  Laterality: Left;  ONLY NEEDS 60 MIN  . EYE SURGERY     bilateral cataract surgery   . Kidney stome surgery  2012  . Left lung biopsy  2015  . LUNG BIOPSY Left 11/21/2013   Procedure: LUNG BIOPSY;  Surgeon: Delight Ovens, MD;  [facility] OR;  Service: Thoracic;  Laterality: Left;  Marland Kitchen VIDEO ASSISTED THORACOSCOPY Left 11/21/2013   Procedure: VIDEO ASSISTED THORACOSCOPY;  Surgeon: Delight Ovens, MD;  Location: [facility] OR;  Service: Thoracic;  Laterality: Left;  Marland Kitchen VIDEO BRONCHOSCOPY N/A 11/21/2013   Procedure: VIDEO BRONCHOSCOPY;  Surgeon: Delight Ovens, MD;  Location: [facility] OR;  Service: Thoracic;  Laterality: N/A;    Family History  Problem Relation Age of Onset  . Hypertension Mother   . Hepatitis C Mother   . Diabetes Father   . Hypertension Father   . Bipolar disorder Brother   . Multiple sclerosis Sister     Allergies  Allergen Reactions  . Shellfish Allergy Anaphylaxis  . Other     Blood Product Refusal   . Pregabalin Swelling    Able to tolerate Lyrica (brand name only)  . Gabapentin Rash    Outpatient Medications Prior to Visit  Medication Sig Dispense Refill  .  aspirin EC 81 MG tablet Take 81 mg by mouth daily.    Marland Kitchen atorvastatin (LIPITOR) 40 MG tablet Take 1 tablet (40 mg total) by mouth daily. 30 tablet 6  . Cyanocobalamin (B-12) 2000 MCG TABS Take 2,000 mcg by mouth daily. 30 tablet 3  . etodolac (LODINE) 500 MG tablet Take 1 tablet (500 mg total) by mouth 2 (two) times daily. 30 tablet 0  . fentaNYL (DURAGESIC - DOSED MCG/HR) 50 MCG/HR Place 1 patch (50 mcg total) onto the skin every 3 (three) days. 5 patch 0  . folic acid  (FOLVITE) 1 MG tablet Take 1 tablet (1 mg total) by mouth daily. 90 tablet 3  . furosemide (LASIX) 20 MG tablet TAKE 1 TABLET(20 MG) BY MOUTH DAILY (Patient taking differently: Take 20 mg by mouth every Monday, Wednesday, and Friday. ) 90 tablet 0  . hydroxychloroquine (PLAQUENIL) 200 MG tablet Take 200-400 mg by mouth See admin instructions. Take 200 mg by mouth every other day alternating with 400 mg (2 tablets) every other day    . levETIRAcetam (KEPPRA) 500 MG tablet TAKE 1 TABLET(500 MG) BY MOUTH TWICE DAILY (Patient taking differently: Take 500 mg by mouth 2 (two) times daily. ) 60 tablet 11  . megestrol (MEGACE) 40 MG/ML suspension SHAKE LIQUID AND TAKE 10 ML(400 MG) BY MOUTH DAILY (Patient not taking: Reported on 07/16/2018) 480 mL 0  . mycophenolate (MYFORTIC) 360 MG TBEC EC tablet Take 360-720 mg by mouth See admin instructions. Take  in the am and  in pm    . Naloxone HCl (EVZIO) 0.4 MG/0.4ML SOAJ Inject 0.4 mLs into the muscle as needed (overdose).     . nepafenac (ILEVRO) 0.3 % ophthalmic suspension Place 1 drop into both eyes every other day.     . OXYGEN Inhale 2 L/min into the lungs at bedtime.    . pantoprazole (PROTONIX) 40 MG tablet Take 1 tablet (40 mg total) by mouth 2 (two) times daily. 60 tablet 6  . polyethylene glycol (MIRALAX / GLYCOLAX) packet Take 17 g by mouth daily as needed for mild constipation or moderate constipation. 14 each 0  . Potassium Chloride ER 20 MEQ TBCR Take 20 mEq by mouth daily. 30 tablet 6  . prednisoLONE acetate (PRED FORTE) 1 % ophthalmic suspension Place 1 drop into the left eye 4 (four) times daily.    . predniSONE (DELTASONE) 10 MG tablet Take 10 mg by mouth daily.   3  . pregabalin (LYRICA) 300 MG capsule Take 300 mg by mouth 2 (two) times daily.    . ranitidine (ZANTAC) 150 MG capsule Take 150 mg by mouth 2 (two) times daily.    . rizatriptan (MAXALT) 10 MG tablet Take 1 tab at the onset of migraine.May repeat in 2 hours if needed. Not  to exceed 2tabs/24 hours (Patient taking differently: Take 10 mg by mouth See admin instructions. Take 10 mg at the onset of migraine. May repeat in 2 hours if needed. Not to exceed 2 tabs/24 hours) 10 tablet 5  . SYMBICORT 80-4.5 MCG/ACT inhaler Inhale 2 puffs into the lungs 2 (two) times daily.   1  . topiramate (TOPAMAX) 100 MG tablet Take 1.5 tablets (150 mg total) by mouth daily. (Patient taking differently: Take 150 mg by mouth at bedtime. ) 45 tablet 5  . traMADol (ULTRAM) 50 MG tablet Take 1-2 tablets (50-100 mg total) by mouth every 4 (four) hours as needed (cough). (Patient taking differently: Take 50-100 mg by mouth every  6 (six) hours as needed for moderate pain. ) 84 tablet 0  . Vitamin D, Ergocalciferol, (DRISDOL) 50000 units CAPS capsule Take 1 capsule (50,000 Units total) by mouth every 7 (seven) days. (Patient taking differently: Take 50,000 Units by mouth every Saturday. ) 12 capsule 3   No facility-administered medications prior to visit.      ROS Review of Systems  Constitutional: Negative for activity change, appetite change and fatigue.  HENT: Negative for congestion, sinus pressure and sore throat.   Eyes: Negative for visual disturbance.  Respiratory: Negative for cough, chest tightness, shortness of breath and wheezing.   Cardiovascular: Negative for chest pain and palpitations.  Gastrointestinal: Negative for abdominal distention, abdominal pain and constipation.  Endocrine: Negative for polydipsia.  Genitourinary: Positive for dysuria. Negative for frequency.  Musculoskeletal: Negative for arthralgias and back pain.  Skin: Negative for rash.  Neurological: Negative for tremors, light-headedness and numbness.  Hematological: Does not bruise/bleed easily.  Psychiatric/Behavioral: Negative for agitation and behavioral problems.    Objective:  There were no vitals taken for this visit.  BP/Weight 11/05/2018 11/04/2018 10/05/2018  Systolic BP 116 115 115  Diastolic  BP 62 58 73  Wt. (Lbs) 107 107 -  BMI 20.22 20.22 -      Physical Exam AAOX3 Unable to perform physical exam due to telephone encounter  CMP Latest Ref Rng & Units 11/04/2018 10/05/2018 10/04/2018  Glucose 70 - 99 mg/dL 81 096(K) 383(K)  BUN 6 - 20 mg/dL 18 18(M) 16  Creatinine 0.44 - 1.00 mg/dL 0.37 5.43(K) 0.67  Sodium 135 - 145 mmol/L 141 141 138  Potassium 3.5 - 5.1 mmol/L 3.8 3.9 3.9  Chloride 98 - 111 mmol/L 111 114(H) 111  CO2 22 - 32 mmol/L 24 18(L) 18(L)  Calcium 8.9 - 10.3 mg/dL 9.1 7.0(H) 4.0(B)  Total Protein 6.5 - 8.1 g/dL - - -  Total Bilirubin 0.3 - 1.2 mg/dL - - -  Alkaline Phos 38 - 126 U/L - - -  AST 15 - 41 U/L - - -  ALT 0 - 44 U/L - - -    Lipid Panel     Component Value Date/Time   CHOL 181 08/08/2018 1152   TRIG 94 08/08/2018 1152   HDL 52 08/08/2018 1152   CHOLHDL 3.5 08/08/2018 1152   CHOLHDL 5.3 09/10/2014 1142   VLDL 36 09/10/2014 1142   LDLCALC 110 (H) 08/08/2018 1152    CBC    Component Value Date/Time   WBC 8.0 11/04/2018 1148   RBC 4.24 11/04/2018 1148   HGB 10.9 (L) 11/04/2018 1148   HGB 12.9 10/11/2017 1120   HCT 38.4 11/04/2018 1148   HCT 42.9 10/11/2017 1120   PLT 238 11/04/2018 1148   PLT 173 10/11/2017 1120   MCV 90.6 11/04/2018 1148   MCV 84 10/11/2017 1120   MCH 25.7 (L) 11/04/2018 1148   MCHC 28.4 (L) 11/04/2018 1148   RDW 16.5 (H) 11/04/2018 1148   RDW 15.4 10/11/2017 1120   LYMPHSABS 0.5 (L) 10/02/2018 0319   LYMPHSABS 2.7 10/11/2017 1120   MONOABS 0.8 10/02/2018 0319   EOSABS 0.0 10/02/2018 0319   EOSABS 0.0 10/11/2017 1120   BASOSABS 0.0 10/02/2018 0319   BASOSABS 0.0 10/11/2017 1120    Lab Results  Component Value Date   HGBA1C 5.6 09/10/2014    Assessment & Plan:   1. Acute cystitis without hematuria We will treat presumptively for UTI - cephALEXin (KEFLEX) 500 MG capsule; Take 1 capsule (500 mg  total) by mouth 4 (four) times daily.  Dispense: 28 capsule; Refill: 0   Meds ordered this encounter   Medications  . cephALEXin (KEFLEX) 500 MG capsule    Sig: Take 1 capsule (500 mg total) by mouth 4 (four) times daily.    Dispense:  28 capsule    Refill:  0    Follow-up: Advised to call for follow-up visit in 3 months      Hoy Register, MD, FAAFP. [facility] [place], [place] [phone]   12/11/2018, 6:00 PM

## 2018-12-16 ENCOUNTER — Telehealth: Payer: Self-pay | Admitting: Family Medicine

## 2018-12-16 NOTE — Telephone Encounter (Signed)
Pt called back stated that the medication cephALEXin (KEFLEX) 500 MG capsule is working and wanted to let her Dr know

## 2018-12-17 NOTE — Telephone Encounter (Signed)
Will route to PCP 

## 2019-01-14 ENCOUNTER — Telehealth: Payer: Self-pay | Admitting: Adult Health

## 2019-01-14 ENCOUNTER — Other Ambulatory Visit: Payer: Self-pay | Admitting: Adult Health

## 2019-01-14 DIAGNOSIS — R569 Unspecified convulsions: Secondary | ICD-10-CM

## 2019-01-14 MED ORDER — LEVETIRACETAM 500 MG PO TABS: ORAL_TABLET | ORAL | 1 refills | Status: DC

## 2019-01-14 MED ORDER — TOPIRAMATE 100 MG PO TABS: 150.0000 mg | ORAL_TABLET | Freq: Every day | ORAL | 1 refills | Status: DC

## 2019-01-14 NOTE — Telephone Encounter (Signed)
Pt has called to inform that she took her last levETIRAcetam (KEPPRA) 500 MG tablet, pt is in need of a refill. Pt is also asking for a refill on her topiramate (TOPAMAX) 100 MG tablet  Please send to [facility] #[id number]

## 2019-01-14 NOTE — Addendum Note (Signed)
Addended by: Guy Begin on: 01/14/2019 05:03 PM   Modules accepted: Orders

## 2019-02-13 ENCOUNTER — Other Ambulatory Visit: Payer: Self-pay | Admitting: Family Medicine

## 2019-03-12 ENCOUNTER — Telehealth: Payer: Self-pay | Admitting: Adult Health

## 2019-03-12 ENCOUNTER — Other Ambulatory Visit: Payer: Self-pay | Admitting: Neurology

## 2019-03-12 DIAGNOSIS — R569 Unspecified convulsions: Secondary | ICD-10-CM

## 2019-03-12 MED ORDER — LEVETIRACETAM 500 MG PO TABS: ORAL_TABLET | ORAL | 0 refills | Status: DC

## 2019-03-12 NOTE — Telephone Encounter (Signed)
Has appt on 03-18-19.

## 2019-03-12 NOTE — Telephone Encounter (Signed)
Pt is requesting a refill of levETIRAcetam (KEPPRA) 500 MG tablet, to be sent to [id number] - [place], [place] - [address]

## 2019-03-17 ENCOUNTER — Ambulatory Visit: Payer: Medicaid Other | Admitting: Adult Health

## 2019-03-18 ENCOUNTER — Telehealth (INDEPENDENT_AMBULATORY_CARE_PROVIDER_SITE_OTHER): Payer: Medicaid Other | Admitting: Adult Health

## 2019-03-18 DIAGNOSIS — R569 Unspecified convulsions: Secondary | ICD-10-CM

## 2019-03-18 DIAGNOSIS — G43009 Migraine without aura, not intractable, without status migrainosus: Secondary | ICD-10-CM | POA: Diagnosis not present

## 2019-03-18 MED ORDER — TOPIRAMATE 100 MG PO TABS: 150.0000 mg | ORAL_TABLET | Freq: Every day | ORAL | 11 refills | Status: DC

## 2019-03-18 MED ORDER — RIZATRIPTAN BENZOATE 10 MG PO TABS: ORAL_TABLET | ORAL | 5 refills | Status: DC

## 2019-03-18 MED ORDER — LEVETIRACETAM 500 MG PO TABS: ORAL_TABLET | ORAL | 11 refills | Status: DC

## 2019-03-18 NOTE — Progress Notes (Signed)
I have read the note, and I agree with the clinical assessment and plan.  Merrisa Skorupski K Sammuel Blick   

## 2019-03-18 NOTE — Progress Notes (Signed)
  [facility] [address] [place]. [place] [address] [phone]     Virtual Visit via Telephone Note  I connected with [name] [name] on 03/18/19 at 10:30 AM EDT by telephone located remotely at [facility] and verified that I am speaking with the correct person using two identifiers who reports being located at home.    Visit scheduled by Ward Givens. I discussed the limitations, risks, security and privacy concerns of performing an evaluation and management service by telephone and the availability of in person appointments. I also discussed with the patient that there may be a patient responsible charge related to this service. The patient expressed understanding and agreed to proceed. See telephone note for consent and additional scheduling information.    History of Present Illness:  [name] [name] is a 58 y.o. female who has been followed in this office for seizures and migraines. She  was initially scheduled for face-to-face office follow up visit today time but due to [place], visit rescheduled for a video visit but her IPAD would not connect therefore she was converted to a non-face-to-face telephone visit with patients consent. Unable to participate in video visit due to lack of access to device with camera.    TODAY 03/18/19 :  [name] [name] is a 58 year old female with a history of seizures and migraine headaches.  She denies any seizure events.  Remains on Keppra.  She states that she started an allergy shot-she is unsure of the name.  She states that since she started the injection she has been having daily headaches.  She remains on Topamax 125 mg at bedtime.  She continues to use Maxalt with good benefit.   Observations/Objective:    Neurological examination  Mentation: Alert oriented to time, place, history taking.  speech and language fluent  Assessment and Plan:  1. Seizures 2. Migraines  She will continue on Keppra.  She will  continue on Topamax and Maxalt.  I have advised that she should let the provider that prescribed her the allergy shots know that is causing daily headaches.  She voiced understanding.  If her headaches do not improve with adjustment of this medication she should let us know.  She will follow-up in 6 months for another virtual visit.  Follow Up Instructions:    FU in 6 months   I discussed the assessment and treatment plan with the patient.  The patient was provided an opportunity to ask questions and all were answered to their satisfaction. The patient agreed with the plan and verbalized an understanding of the instructions.   I provided 15 minutes of non-face-to-face time during this encounter.    Ward Givens NP-C [facility] [address] [place], [place] [address]  Phone [phone] Fax [phone]

## 2019-06-19 ENCOUNTER — Other Ambulatory Visit: Payer: Self-pay | Admitting: Family Medicine

## 2019-06-19 DIAGNOSIS — G8929 Other chronic pain: Secondary | ICD-10-CM

## 2019-06-20 ENCOUNTER — Telehealth: Payer: Self-pay | Admitting: Family Medicine

## 2019-06-20 ENCOUNTER — Other Ambulatory Visit: Payer: Self-pay

## 2019-06-20 DIAGNOSIS — G8929 Other chronic pain: Secondary | ICD-10-CM

## 2019-06-20 MED ORDER — PANTOPRAZOLE SODIUM 40 MG PO TBEC: 40.0000 mg | DELAYED_RELEASE_TABLET | Freq: Two times a day (BID) | ORAL | 6 refills | Status: DC

## 2019-06-20 NOTE — Telephone Encounter (Signed)
Refill sent to [facility] on file.

## 2019-06-20 NOTE — Telephone Encounter (Signed)
1) Medication(s) Requested (by name): Platonix 2) Pharmacy of Choice:  [facility]  States she has diarrhea and is unable to come in office for an appt. Please follow up.

## 2019-08-21 ENCOUNTER — Telehealth: Payer: Self-pay | Admitting: Family Medicine

## 2019-08-21 NOTE — Telephone Encounter (Signed)
Can you walk the pt thought how to set up mychart on her computer

## 2019-08-25 ENCOUNTER — Ambulatory Visit: Payer: Medicaid Other | Attending: Internal Medicine | Admitting: Internal Medicine

## 2019-08-25 ENCOUNTER — Encounter: Payer: Self-pay | Admitting: Internal Medicine

## 2019-08-25 ENCOUNTER — Other Ambulatory Visit: Payer: Self-pay

## 2019-08-25 DIAGNOSIS — R300 Dysuria: Secondary | ICD-10-CM | POA: Diagnosis not present

## 2019-08-25 NOTE — Progress Notes (Signed)
Fever and pain with urination

## 2019-08-25 NOTE — Progress Notes (Signed)
Virtual Visit via Telephone Note  I connected with [name] [name] on 08/25/19 at  3:30 PM EST by telephone and verified that I am speaking with the correct person using two identifiers.  Location: Patient: home Provider: home   I discussed the limitations, risks, security and privacy concerns of performing an evaluation and management service by telephone and the availability of in person appointments. I also discussed with the patient that there may be a patient responsible charge related to this service. The patient expressed understanding and agreed to proceed.   History of Present Illness: Pt c/o slight discomfort with urination x 2 weeks. Sx intermittent, mild, assoc with dark urination, denies frequency, hematuria, flank pain, abd pain,n/v,  Fever. Has been checking temp, 98.9-99 in the past 4 days. Reports documented temp of 103 was in the past and was assoc with hospitalization for kidney stone. She denies fever with current sx. Reports last time she thought she had uti, her urine was normal.    Observations/Objective: Temp 98.9-99 per pt  Assessment and Plan: Dysuria, dark urine- discussed ddx, sx may be assoc with dehydration, does not have other sx assoc with uti. Admits to not drinking a lot of fluids, advised to increase fluid intake and monitor sx, if sx not improved with above in next 1-2 days, advised to call back for possible abx.  Follow Up Instructions:    I discussed the assessment and treatment plan with the patient. The patient was provided an opportunity to ask questions and all were answered. The patient agreed with the plan and demonstrated an understanding of the instructions.   The patient was advised to call back or seek an in-person evaluation if the symptoms worsen or if the condition fails to improve as anticipated.  I provided 15 minutes of non-face-to-face time during this encounter.   Kimber Relic, MD

## 2019-09-30 ENCOUNTER — Telehealth: Payer: Medicaid Other | Admitting: Adult Health

## 2019-12-26 ENCOUNTER — Telehealth: Payer: Self-pay | Admitting: Family Medicine

## 2019-12-26 NOTE — Telephone Encounter (Signed)
Patient called and requested for a call back regarding the medicaid paperwork, patient was informed of the insurance policies through , she stated that she doesn't understand why she has to fill it out she stated that she was told she should stay with her plan that she currently has and to not select a new one due to her chronic health condition. Please follow up at your earliest convenience.

## 2019-12-26 NOTE — Telephone Encounter (Signed)
Patient called to inform MA that the number is [phone] or [email or web address].

## 2020-01-22 ENCOUNTER — Telehealth: Payer: Self-pay

## 2020-01-22 NOTE — Telephone Encounter (Signed)
Letter has been faxed over. 

## 2020-01-22 NOTE — Telephone Encounter (Signed)
Letter is ready.

## 2020-01-22 NOTE — Telephone Encounter (Signed)
Patient is requesting the letter that was discussed yeterday.  Patient would like to currently stay with her PCP.  Fax letter to [phone]

## 2020-01-30 ENCOUNTER — Telehealth: Payer: Self-pay | Admitting: Family Medicine

## 2020-01-30 DIAGNOSIS — G894 Chronic pain syndrome: Secondary | ICD-10-CM

## 2020-01-30 NOTE — Telephone Encounter (Signed)
Patient called saying she goes to [facility] for pain management and [facility] for her other specialist. Patient has medicaid and her pain management doctor is only taking [facility] medicaid and in [place] they are not going to accept [facility]. Patient would like to know if there is another place where she can go for pain management that accepts [facility]. Patient states she takes the fentanyl patches.  Please f/u

## 2020-02-02 NOTE — Telephone Encounter (Signed)
Thank You  Sent referral to [facility] for [facility] Ph. # [phone] Address [address] [place], [place] [address]. They have different locations in [place] and [place].

## 2020-02-02 NOTE — Telephone Encounter (Signed)
Referral has been placed. 

## 2020-04-09 ENCOUNTER — Telehealth: Payer: Self-pay | Admitting: Adult Health

## 2020-04-09 ENCOUNTER — Other Ambulatory Visit: Payer: Self-pay | Admitting: Adult Health

## 2020-04-09 DIAGNOSIS — R569 Unspecified convulsions: Secondary | ICD-10-CM

## 2020-04-09 MED ORDER — LEVETIRACETAM 500 MG PO TABS: ORAL_TABLET | ORAL | 0 refills | Status: DC

## 2020-04-09 NOTE — Telephone Encounter (Signed)
..   Pt understands that although there may be some limitations with this type of visit, we will take all precautions to reduce any security or privacy concerns.  Pt understands that this will be treated like an in office visit and we will file with pt's insurance, and there may be a patient responsible charge related to this service. ? ?

## 2020-04-09 NOTE — Addendum Note (Signed)
Addended by: Lilla Shook on: 04/09/2020 09:25 AM   Modules accepted: Orders

## 2020-04-09 NOTE — Telephone Encounter (Signed)
Ok per Dr. Terrace Arabia to send in refill. The patient has been informed.

## 2020-04-09 NOTE — Telephone Encounter (Signed)
Pt has called asking if the On Call Dr can call in her levETIRAcetam (KEPPRA) 500 MG tablet to [facility] #[id number] since she took her last pill this morning

## 2020-04-12 NOTE — Telephone Encounter (Signed)
Noted for migraines/ seizures.

## 2020-04-13 ENCOUNTER — Telehealth (INDEPENDENT_AMBULATORY_CARE_PROVIDER_SITE_OTHER): Payer: Medicaid Other | Admitting: Neurology

## 2020-04-13 ENCOUNTER — Encounter: Payer: Self-pay | Admitting: Neurology

## 2020-04-13 DIAGNOSIS — G43019 Migraine without aura, intractable, without status migrainosus: Secondary | ICD-10-CM

## 2020-04-13 DIAGNOSIS — G40909 Epilepsy, unspecified, not intractable, without status epilepticus: Secondary | ICD-10-CM | POA: Diagnosis not present

## 2020-04-13 DIAGNOSIS — R569 Unspecified convulsions: Secondary | ICD-10-CM

## 2020-04-13 MED ORDER — TOPIRAMATE 100 MG PO TABS: 100.0000 mg | ORAL_TABLET | Freq: Every day | ORAL | 3 refills | Status: DC

## 2020-04-13 MED ORDER — RIZATRIPTAN BENZOATE 10 MG PO TABS: ORAL_TABLET | ORAL | 5 refills | Status: DC

## 2020-04-13 MED ORDER — LEVETIRACETAM 500 MG PO TABS: ORAL_TABLET | ORAL | 3 refills | Status: DC

## 2020-04-13 NOTE — Progress Notes (Signed)
    Virtual Visit via Video Note  I connected with [name] [name] on 04/13/20 at  2:15 PM EDT by a video enabled telemedicine application and verified that I am speaking with the correct person using two identifiers.  Location: Patient: at her home Provider: in the office    I discussed the limitations of evaluation and management by telemedicine and the availability of in person appointments. The patient expressed understanding and agreed to proceed.  History of Present Illness: 03/14/2020 SS: [name] [name] is a 59 year old female with history of seizures and migraine headaches.  She is on Keppra, Topamax, and Maxalt. She lowered her dose of Topamax to 100 mg at bedtime, she was having side effect (was previously on 100 mg for migraines). No seizures, has been years since last seizure. Headaches doing well, had a cluster in March/April. Was [facility] to [facility] for interstitial lung disease. Overall, migraines and seizures are well controlled, Maxalt works well.  Presents today for evaluation via virtual visit.  03/18/2019 MM: [name] [name] is a 59 year old female with a history of seizures and migraine headaches.  She denies any seizure events.  Remains on Keppra.  She states that she started an allergy shot-she is unsure of the name.  She states that since she started the injection she has been having daily headaches.  She remains on Topamax 125 mg at bedtime.  She continues to use Maxalt with good benefit.   Observations/Objective: Via virtual visit, is alert and oriented, speech is clear and concise, follows commands  Assessment and Plan: 1.  Seizures 2.  Migraines  She is overall doing well with these issues. She will remain on Keppra, Topamax, and Maxalt.  Refills were sent in.  She will follow-up in 6 months or sooner if needed.  Follow Up Instructions: 6 months 10/20/2020 1:30   I discussed the assessment and treatment plan with the patient. The patient was provided an opportunity to ask  questions and all were answered. The patient agreed with the plan and demonstrated an understanding of the instructions.   The patient was advised to call back or seek an in-person evaluation if the symptoms worsen or if the condition fails to improve as anticipated.  I spent 20 minutes of face-to-face and non-face-to-face time with patient.  This included previsit chart review, lab review, study review, order entry, electronic health record documentation, patient education.  Otila Kluver, DNP  [facility] [address] [place], [place] [address] [phone]

## 2020-04-14 NOTE — Progress Notes (Signed)
I have read the note, and I agree with the clinical assessment and plan.  Lennin Osmond K Adalee Kathan   

## 2020-05-20 ENCOUNTER — Other Ambulatory Visit: Payer: Self-pay | Admitting: Family Medicine

## 2020-05-20 DIAGNOSIS — R1013 Epigastric pain: Secondary | ICD-10-CM

## 2020-06-14 ENCOUNTER — Other Ambulatory Visit: Payer: Self-pay

## 2020-06-14 ENCOUNTER — Ambulatory Visit: Payer: Medicaid Other | Attending: Family Medicine | Admitting: Family Medicine

## 2020-06-14 ENCOUNTER — Encounter: Payer: Self-pay | Admitting: Family Medicine

## 2020-06-14 VITALS — BP 83/56 | HR 83 | Ht 61.0 in | Wt 124.0 lb

## 2020-06-14 DIAGNOSIS — R600 Localized edema: Secondary | ICD-10-CM | POA: Diagnosis not present

## 2020-06-14 DIAGNOSIS — J849 Interstitial pulmonary disease, unspecified: Secondary | ICD-10-CM

## 2020-06-14 DIAGNOSIS — K2100 Gastro-esophageal reflux disease with esophagitis, without bleeding: Secondary | ICD-10-CM

## 2020-06-14 DIAGNOSIS — N3000 Acute cystitis without hematuria: Secondary | ICD-10-CM

## 2020-06-14 DIAGNOSIS — R070 Pain in throat: Secondary | ICD-10-CM | POA: Diagnosis not present

## 2020-06-14 DIAGNOSIS — E785 Hyperlipidemia, unspecified: Secondary | ICD-10-CM | POA: Diagnosis not present

## 2020-06-14 DIAGNOSIS — M3219 Other organ or system involvement in systemic lupus erythematosus: Secondary | ICD-10-CM

## 2020-06-14 LAB — POCT URINALYSIS DIP (CLINITEK)
Blood, UA: NEGATIVE
Glucose, UA: NEGATIVE mg/dL
Ketones, POC UA: NEGATIVE mg/dL
Nitrite, UA: NEGATIVE
POC PROTEIN,UA: NEGATIVE
Spec Grav, UA: 1.015 (ref 1.010–1.025)
Urobilinogen, UA: 0.2 E.U./dL
pH, UA: 5.5 (ref 5.0–8.0)

## 2020-06-14 MED ORDER — SODIUM CHLORIDE 0.9 % IV BOLUS
500.0000 mL | Freq: Once | INTRAVENOUS | Status: AC
  Administered 2020-06-14: 500 mL via INTRAVENOUS

## 2020-06-14 MED ORDER — CEFDINIR 300 MG PO CAPS: 300.0000 mg | ORAL_CAPSULE | Freq: Two times a day (BID) | ORAL | 0 refills | Status: DC

## 2020-06-14 MED ORDER — FUROSEMIDE 20 MG PO TABS: 20.0000 mg | ORAL_TABLET | ORAL | 0 refills | Status: DC

## 2020-06-14 MED ORDER — FLUCONAZOLE 150 MG PO TABS: 150.0000 mg | ORAL_TABLET | Freq: Every day | ORAL | 0 refills | Status: DC

## 2020-06-14 MED ORDER — PANTOPRAZOLE SODIUM 40 MG PO TBEC: DELAYED_RELEASE_TABLET | ORAL | 1 refills | Status: DC

## 2020-06-14 MED ORDER — CEFTRIAXONE SODIUM 1 G IJ SOLR
1.0000 g | Freq: Once | INTRAMUSCULAR | Status: AC
  Administered 2020-06-14: 1 g via INTRAMUSCULAR

## 2020-06-14 MED ORDER — ATORVASTATIN CALCIUM 40 MG PO TABS: 40.0000 mg | ORAL_TABLET | Freq: Every day | ORAL | 1 refills | Status: DC

## 2020-06-14 NOTE — Progress Notes (Signed)
States that last week her tongue was swollen and painful.(she has pictures)  States that she can not swallow, she has tried to use "pink honey" .  States that she feel very weak.

## 2020-06-14 NOTE — Progress Notes (Signed)
Subjective:  Patient ID: [name] [name], female    DOB: December 19, 1960  Age: 59 y.o. MRN: [id number]  CC: Sore Throat   HPI [name] [name]  is a 59 year old female with a history of Fibromyalgia,systemic lupus erythematosus with interstitial lung disease, fibromyalgia, GERD, seizures, migraines who , history of cystoscopy, left ureteroscopy with laser lithotripsy and stent exchange lastseen today for follow-up visit.  Her BP is low today and she states she checked her BP out of the office and it was this low. She has had dysphagia which responded to rose honey. Her whole mouth hurts as well. Her tongue was also whitish and she had to scrape of the white layer but has also noticed bumps on her tongue.  Symptoms have been on for 2 weeks Her urine has been dark, has had a bad odor and she has dysuria. She has been weak; denies presence of fever, flank pain, nausea or vomiting.  Has had migraine at an increased frequency which he attributes to her acute symptoms stated above. Interstitial lung disease is followed by a pulmonologist at [facility] whom she is not happy about that she states her pulmonologist resigned and she has a new pulmonologist.  Past Medical History:  Diagnosis Date  . Anemia   . CHF (congestive heart failure) ([place])   . Chronic kidney disease   . Chronic pain   . COPD (chronic obstructive pulmonary disease) ([place])   . Fibromyalgia   . Foley catheter in place   . History of kidney stones   . Migraine   . Migraine without aura, with intractable migraine, so stated, without mention of status migrainosus 05/18/2014  . Oxygen dependent    as needed 2L at hs per patient   . Peripheral vascular disease ([place])   . Pneumonia   . PONV (postoperative nausea and vomiting)   . Pulmonary fibrosis ([place])   . Seizures ([place])   . SLE (systemic lupus erythematosus) ([place]) 12/12/2017  . UTI (urinary tract infection) 09/30/2018    Past Surgical History:  Procedure Laterality  Date  . ABDOMINAL HYSTERECTOMY    . CHOLECYSTECTOMY    . CYSTOSCOPY W/ URETERAL STENT PLACEMENT Left 10/03/2018   Procedure: CYSTOSCOPY WITH RETROGRADE PYELOGRAM/URETERAL STENT PLACEMENT;  Surgeon: Festus Aloe, MD;  Location: [facility];  Service: Urology;  Laterality: Left;  . CYSTOSCOPY/URETEROSCOPY/HOLMIUM LASER/STENT PLACEMENT Left 11/05/2018   Procedure: CYSTOSCOPY/URETEROSCOPY/HOLMIUM LASER/STENT EXCHANGE;  Surgeon: Festus Aloe, MD;  Location: [facility];  Service: Urology;  Laterality: Left;  ONLY NEEDS 60 MIN  . EYE SURGERY     bilateral cataract surgery   . Kidney stome surgery  2012  . Left lung biopsy  2015  . LUNG BIOPSY Left 11/21/2013   Procedure: LUNG BIOPSY;  Surgeon: Grace Isaac, MD;  Location: [place];  Service: Thoracic;  Laterality: Left;  Marland Kitchen VIDEO ASSISTED THORACOSCOPY Left 11/21/2013   Procedure: VIDEO ASSISTED THORACOSCOPY;  Surgeon: Grace Isaac, MD;  Location: [place];  Service: Thoracic;  Laterality: Left;  Marland Kitchen VIDEO BRONCHOSCOPY N/A 11/21/2013   Procedure: VIDEO BRONCHOSCOPY;  Surgeon: Grace Isaac, MD;  Location: [facility] OR;  Service: Thoracic;  Laterality: N/A;    Family History  Problem Relation Age of Onset  . Hypertension Mother   . Hepatitis C Mother   . Diabetes Father   . Hypertension Father   . Bipolar disorder Brother   . Multiple sclerosis Sister     Allergies  Allergen Reactions  . Shellfish Allergy Anaphylaxis  .  Other     Blood Product Refusal   . Pregabalin Swelling    Able to tolerate Lyrica (brand name only)  . Gabapentin Rash    Outpatient Medications Prior to Visit  Medication Sig Dispense Refill  . Cyanocobalamin (B-12) 2000 MCG TABS Take 2,000 mcg by mouth daily. 30 tablet 3  . etodolac (LODINE) 500 MG tablet Take 1 tablet (500 mg total) by mouth 2 (two) times daily. 30 tablet 0  . fentaNYL (DURAGESIC - DOSED MCG/HR) 50 MCG/HR Place 1 patch (50 mcg total) onto the skin every 3 (three) days. 5 patch 0  . folic acid (FOLVITE) 1  MG tablet Take 1 tablet (1 mg total) by mouth daily. 90 tablet 3  . hydroxychloroquine (PLAQUENIL) 200 MG tablet Take 200-400 mg by mouth See admin instructions. Take 200 mg by mouth every other day alternating with 400 mg (2 tablets) every other day    . levETIRAcetam (KEPPRA) 500 MG tablet TAKE 1 TABLET(500 MG) BY MOUTH TWICE DAILY 180 tablet 3  . mycophenolate (MYFORTIC) 360 MG TBEC EC tablet Take 360-720 mg by mouth See admin instructions. Take 735m in the am and 3627min pm    . Naloxone HCl (EVZIO) 0.4 MG/0.4ML SOAJ Inject 0.4 mLs into the muscle as needed (overdose).     . OXYGEN Inhale 2 L/min into the lungs at bedtime.    . polyethylene glycol (MIRALAX / GLYCOLAX) packet Take 17 g by mouth daily as needed for mild constipation or moderate constipation. 14 each 0  . pregabalin (LYRICA) 300 MG capsule Take 300 mg by mouth 2 (two) times daily.    . rizatriptan (MAXALT) 10 MG tablet Take 1 tab at the onset of migraine.May repeat in 2 hours if needed. Not to exceed 2tabs/24 hours 10 tablet 5  . SYMBICORT 80-4.5 MCG/ACT inhaler Inhale 2 puffs into the lungs 2 (two) times daily.   1  . topiramate (TOPAMAX) 100 MG tablet Take 1 tablet (100 mg total) by mouth daily. 90 tablet 3  . traMADol (ULTRAM) 50 MG tablet Take 1-2 tablets (50-100 mg total) by mouth every 4 (four) hours as needed (cough). (Patient taking differently: Take 50-100 mg by mouth every 6 (six) hours as needed for moderate pain. ) 84 tablet 0  . Vitamin D, Ergocalciferol, (DRISDOL) 50000 units CAPS capsule Take 1 capsule (50,000 Units total) by mouth every 7 (seven) days. (Patient taking differently: Take 50,000 Units by mouth every Saturday. ) 12 capsule 3  . atorvastatin (LIPITOR) 40 MG tablet Take 1 tablet (40 mg total) by mouth daily. 30 tablet 6  . furosemide (LASIX) 20 MG tablet TAKE 1 TABLET(20 MG) BY MOUTH DAILY (Patient taking differently: Take 20 mg by mouth every Monday, Wednesday, and Friday. ) 90 tablet 0  . pantoprazole  (PROTONIX) 40 MG tablet TAKE 1 TABLET(40 MG) BY MOUTH TWICE DAILY 60 tablet 0  . megestrol (MEGACE) 40 MG/ML suspension SHAKE LIQUID AND TAKE 10 ML(400 MG) BY MOUTH DAILY (Patient not taking: Reported on 07/16/2018) 480 mL 0  . nepafenac (ILEVRO) 0.3 % ophthalmic suspension Place 1 drop into both eyes every other day.     . Potassium Chloride ER 20 MEQ TBCR Take 20 mEq by mouth daily. (Patient not taking: Reported on 08/25/2019) 30 tablet 6  . prednisoLONE acetate (PRED FORTE) 1 % ophthalmic suspension Place 1 drop into the left eye 4 (four) times daily.    . predniSONE (DELTASONE) 10 MG tablet Take 7.5 mg by mouth daily.  (  Patient not taking: Reported on 06/14/2020)  3  . ranitidine (ZANTAC) 150 MG capsule Take 150 mg by mouth 2 (two) times daily.    . cephALEXin (KEFLEX) 500 MG capsule Take 1 capsule (500 mg total) by mouth 4 (four) times daily. (Patient not taking: Reported on 08/25/2019) 28 capsule 0   No facility-administered medications prior to visit.     ROS Review of Systems  Constitutional: Positive for appetite change and fatigue. Negative for activity change.  HENT: Positive for sore throat. Negative for congestion and sinus pressure.   Eyes: Negative for visual disturbance.  Respiratory: Negative for cough, chest tightness, shortness of breath and wheezing.   Cardiovascular: Positive for leg swelling. Negative for chest pain and palpitations.  Gastrointestinal: Negative for abdominal distention, abdominal pain and constipation.  Endocrine: Negative for polydipsia.  Genitourinary: Positive for dysuria. Negative for frequency.  Musculoskeletal: Negative for arthralgias and back pain.  Skin: Negative for rash.  Neurological: Negative for tremors, light-headedness and numbness.  Hematological: Does not bruise/bleed easily.  Psychiatric/Behavioral: Negative for agitation and behavioral problems.    Objective:  BP (!) 83/56   Pulse 83   Ht '5\' 1"'  (1.549 m)   Wt 124 lb (56.2 kg)    SpO2 99%   BMI 23.43 kg/m   BP/Weight 06/14/2020 11/05/2018 0/38/8828  Systolic BP 83 003 491  Diastolic BP 56 62 58  Wt. (Lbs) 124 107 107  BMI 23.43 20.22 20.22      Physical Exam Constitutional:      Appearance: She is well-developed.  HENT:     Mouth/Throat:     Tonsils: No tonsillar exudate.     Comments: Papillae which are enlarged on posterior half of tongue Neck:     Vascular: No JVD.  Cardiovascular:     Rate and Rhythm: Normal rate.     Heart sounds: Normal heart sounds. No murmur heard.   Pulmonary:     Effort: Pulmonary effort is normal.     Breath sounds: Normal breath sounds. No wheezing or rales.  Chest:     Chest wall: No tenderness.  Abdominal:     General: Bowel sounds are normal. There is no distension.     Palpations: Abdomen is soft. There is no mass.     Tenderness: There is no abdominal tenderness. There is left CVA tenderness. There is no right CVA tenderness.  Musculoskeletal:        General: Normal range of motion.     Right lower leg: Edema present.     Left lower leg: Edema present.  Neurological:     Mental Status: She is alert and oriented to person, place, and time.  Psychiatric:        Mood and Affect: Mood normal.     CMP Latest Ref Rng & Units 11/04/2018 10/05/2018 10/04/2018  Glucose 70 - 99 mg/dL 81 125(H) 126(H)  BUN 6 - 20 mg/dL 18 22(H) 16  Creatinine 0.44 - 1.00 mg/dL 0.83 1.14(H) 0.77  Sodium 135 - 145 mmol/L 141 141 138  Potassium 3.5 - 5.1 mmol/L 3.8 3.9 3.9  Chloride 98 - 111 mmol/L 111 114(H) 111  CO2 22 - 32 mmol/L 24 18(L) 18(L)  Calcium 8.9 - 10.3 mg/dL 9.1 8.6(L) 8.5(L)  Total Protein 6.5 - 8.1 g/dL - - -  Total Bilirubin 0.3 - 1.2 mg/dL - - -  Alkaline Phos 38 - 126 U/L - - -  AST 15 - 41 U/L - - -  ALT 0 -  44 U/L - - -    Lipid Panel     Component Value Date/Time   CHOL 181 08/08/2018 1152   TRIG 94 08/08/2018 1152   HDL 52 08/08/2018 1152   CHOLHDL 3.5 08/08/2018 1152   CHOLHDL 5.3 09/10/2014 1142    VLDL 36 09/10/2014 1142   LDLCALC 110 (H) 08/08/2018 1152    CBC    Component Value Date/Time   WBC 8.0 11/04/2018 1148   RBC 4.24 11/04/2018 1148   HGB 10.9 (L) 11/04/2018 1148   HGB 12.9 10/11/2017 1120   HCT 38.4 11/04/2018 1148   HCT 42.9 10/11/2017 1120   PLT 238 11/04/2018 1148   PLT 173 10/11/2017 1120   MCV 90.6 11/04/2018 1148   MCV 84 10/11/2017 1120   MCH 25.7 (L) 11/04/2018 1148   MCHC 28.4 (L) 11/04/2018 1148   RDW 16.5 (H) 11/04/2018 1148   RDW 15.4 10/11/2017 1120   LYMPHSABS 0.5 (L) 10/02/2018 0319   LYMPHSABS 2.7 10/11/2017 1120   MONOABS 0.8 10/02/2018 0319   EOSABS 0.0 10/02/2018 0319   EOSABS 0.0 10/11/2017 1120   BASOSABS 0.0 10/02/2018 0319   BASOSABS 0.0 10/11/2017 1120    Lab Results  Component Value Date   HGBA1C 5.6 09/10/2014    Assessment & Plan:  1. Acute cystitis without hematuria Complicated UTI in a patient with hypovolemia.  Other vitals are stable We will proceed with IV fluid administration in the clinic given history of renal calculi she is at risk of progression to sepsis We will administer ceftriaxone in clinic and place on Cefdinir We will send of urine culture Advised that if symptoms do not improve in 24 to 48 hours she needs to present to urgent care - CBC with Differential/Platelet - Urine Culture - cefdinir (OMNICEF) 300 MG capsule; Take 1 capsule (300 mg total) by mouth 2 (two) times daily.  Dispense: 20 capsule; Refill: 0 - POCT URINALYSIS DIP (CLINITEK) - cefTRIAXone (ROCEPHIN) injection 1 g - sodium chloride 0.9 % bolus 500 mL  2. Pedal edema Uncontrolled Lasix will need to be titrated while bearing blood pressure in mind as she is hypotensive - furosemide (LASIX) 20 MG tablet; Take 1 tablet (20 mg total) by mouth every Monday, Wednesday, and Friday.  Dispense: 90 tablet; Refill: 0  3. Dyslipidemia Stable Low-cholesterol diet - CMP14+EGFR - atorvastatin (LIPITOR) 40 MG tablet; Take 1 tablet (40 mg total) by  mouth daily.  Dispense: 90 tablet; Refill: 1 - LP+Non-HDL Cholesterol  4. Gastroesophageal reflux disease with esophagitis without hemorrhage Controlled - pantoprazole (PROTONIX) 40 MG tablet; TAKE 1 TABLET(40 MG) BY MOUTH TWICE DAILY  Dispense: 180 tablet; Refill: 1  5. Throat pain Likely oropharyngeal candidiasis due to immunosuppressant use - fluconazole (DIFLUCAN) 150 MG tablet; Take 1 tablet (150 mg total) by mouth daily.  Dispense: 7 tablet; Refill: 0  6. Other systemic lupus erythematosus with other organ involvement (HCC) Stable Continue CellCept  7. Interstitial lung disease ([place]) See #7 above Follow-up with pulmonology    Meds ordered this encounter  Medications  . furosemide (LASIX) 20 MG tablet    Sig: Take 1 tablet (20 mg total) by mouth every Monday, Wednesday, and Friday.    Dispense:  90 tablet    Refill:  0  . atorvastatin (LIPITOR) 40 MG tablet    Sig: Take 1 tablet (40 mg total) by mouth daily.    Dispense:  90 tablet    Refill:  1  . pantoprazole (PROTONIX) 40 MG  tablet    Sig: TAKE 1 TABLET(40 MG) BY MOUTH TWICE DAILY    Dispense:  180 tablet    Refill:  1  . cefdinir (OMNICEF) 300 MG capsule    Sig: Take 1 capsule (300 mg total) by mouth 2 (two) times daily.    Dispense:  20 capsule    Refill:  0  . fluconazole (DIFLUCAN) 150 MG tablet    Sig: Take 1 tablet (150 mg total) by mouth daily.    Dispense:  7 tablet    Refill:  0  . cefTRIAXone (ROCEPHIN) injection 1 g  . sodium chloride 0.9 % bolus 500 mL    Follow-up: Return in about 3 months (around 09/13/2020) for chronic disease management.       Charlott Rakes, MD, FAAFP. [facility] and [place], [place]   06/14/2020, 1:14 PM

## 2020-06-15 ENCOUNTER — Other Ambulatory Visit: Payer: Self-pay | Admitting: Family Medicine

## 2020-06-15 DIAGNOSIS — E785 Hyperlipidemia, unspecified: Secondary | ICD-10-CM

## 2020-06-15 LAB — CMP14+EGFR
ALT: 5 IU/L (ref 0–32)
AST: 13 IU/L (ref 0–40)
Albumin/Globulin Ratio: 1.8 (ref 1.2–2.2)
Albumin: 4.4 g/dL (ref 3.8–4.9)
Alkaline Phosphatase: 173 IU/L — ABNORMAL HIGH (ref 44–121)
BUN/Creatinine Ratio: 7 — ABNORMAL LOW (ref 9–23)
BUN: 7 mg/dL (ref 6–24)
Bilirubin Total: 0.6 mg/dL (ref 0.0–1.2)
CO2: 24 mmol/L (ref 20–29)
Calcium: 9.1 mg/dL (ref 8.7–10.2)
Chloride: 101 mmol/L (ref 96–106)
Creatinine, Ser: 1 mg/dL (ref 0.57–1.00)
GFR calc Af Amer: 72 mL/min/{1.73_m2} (ref >59)
GFR calc non Af Amer: 62 mL/min/{1.73_m2} (ref >59)
Globulin, Total: 2.5 g/dL (ref 1.5–4.5)
Glucose: 81 mg/dL (ref 65–99)
Potassium: 3.6 mmol/L (ref 3.5–5.2)
Sodium: 141 mmol/L (ref 134–144)
Total Protein: 6.9 g/dL (ref 6.0–8.5)

## 2020-06-15 LAB — LP+NON-HDL CHOLESTEROL
Cholesterol, Total: 224 mg/dL — ABNORMAL HIGH (ref 100–199)
HDL: 44 mg/dL (ref >39)
LDL Chol Calc (NIH): 161 mg/dL — ABNORMAL HIGH (ref 0–99)
Total Non-HDL-Chol (LDL+VLDL): 180 mg/dL — ABNORMAL HIGH (ref 0–129)
Triglycerides: 103 mg/dL (ref 0–149)
VLDL Cholesterol Cal: 19 mg/dL (ref 5–40)

## 2020-06-15 LAB — CBC WITH DIFFERENTIAL/PLATELET
Basophils Absolute: 0 10*3/uL (ref 0.0–0.2)
Basos: 0 %
EOS (ABSOLUTE): 0 10*3/uL (ref 0.0–0.4)
Eos: 0 %
Hematocrit: 50 % — ABNORMAL HIGH (ref 34.0–46.6)
Hemoglobin: 15.8 g/dL (ref 11.1–15.9)
Immature Grans (Abs): 0 10*3/uL (ref 0.0–0.1)
Immature Granulocytes: 0 %
Lymphocytes Absolute: 1.5 10*3/uL (ref 0.7–3.1)
Lymphs: 22 %
MCH: 25.6 pg — ABNORMAL LOW (ref 26.6–33.0)
MCHC: 31.6 g/dL (ref 31.5–35.7)
MCV: 81 fL (ref 79–97)
Monocytes Absolute: 0.7 10*3/uL (ref 0.1–0.9)
Monocytes: 10 %
Neutrophils Absolute: 4.6 10*3/uL (ref 1.4–7.0)
Neutrophils: 68 %
Platelets: 190 10*3/uL (ref 150–450)
RBC: 6.17 x10E6/uL — ABNORMAL HIGH (ref 3.77–5.28)
RDW: 14.1 % (ref 11.7–15.4)
WBC: 6.9 10*3/uL (ref 3.4–10.8)

## 2020-06-15 MED ORDER — ATORVASTATIN CALCIUM 80 MG PO TABS: 80.0000 mg | ORAL_TABLET | Freq: Every day | ORAL | 1 refills | Status: DC

## 2020-06-17 LAB — URINE CULTURE

## 2020-06-18 ENCOUNTER — Telehealth: Payer: Self-pay

## 2020-06-18 NOTE — Telephone Encounter (Signed)
Patient is calling back to let the nurse know she saw the results however she did not understand. CB- [phone]

## 2020-06-18 NOTE — Telephone Encounter (Signed)
-----   Message from Hoy Register, MD sent at 06/15/2020  5:58 PM EDT ----- Labs reveal normal wbc; cholesterol is elevated and I have increased Lipitor to 80mg , advise comply with a low cholesterol diet. Urine culture is still pending

## 2020-06-18 NOTE — Telephone Encounter (Signed)
Called pt made aware of MD note and instructions.

## 2020-06-18 NOTE — Telephone Encounter (Signed)
-----   Message from Hoy Register, MD sent at 06/18/2020  9:19 AM EDT ----- Urine culture is positive for E. coli.  Please advised to complete course of her antibiotics

## 2020-06-18 NOTE — Telephone Encounter (Signed)
Patient has viewed results on mychart and a voicemail was left informing her to call if she has any questions.

## 2020-07-07 ENCOUNTER — Ambulatory Visit: Payer: Medicaid Other | Admitting: Family Medicine

## 2020-09-14 ENCOUNTER — Ambulatory Visit: Payer: Medicaid Other | Attending: Family Medicine | Admitting: Family Medicine

## 2020-09-14 ENCOUNTER — Other Ambulatory Visit: Payer: Self-pay

## 2020-09-14 ENCOUNTER — Ambulatory Visit: Payer: Medicaid Other | Admitting: Family Medicine

## 2020-09-14 DIAGNOSIS — N3000 Acute cystitis without hematuria: Secondary | ICD-10-CM | POA: Diagnosis not present

## 2020-09-14 MED ORDER — CEFDINIR 300 MG PO CAPS: 300.0000 mg | ORAL_CAPSULE | Freq: Two times a day (BID) | ORAL | 0 refills | Status: DC

## 2020-09-14 NOTE — Progress Notes (Signed)
Having painful and burning urination.

## 2020-09-14 NOTE — Progress Notes (Signed)
Virtual Visit via Telephone Note  I connected with [name] [name], on 09/14/2020 at 1:32 PM by telephone due to the COVID-19 pandemic and verified that I am speaking with the correct person using two identifiers.   Consent: I discussed the limitations, risks, security and privacy concerns of performing an evaluation and management service by telephone and the availability of in person appointments. I also discussed with the patient that there may be a patient responsible charge related to this service. The patient expressed understanding and agreed to proceed.   Location of Patient: Home  Location of Provider: Clinic   Persons participating in Telemedicine visit: Bailey Faiella Farrington-CMA Dr. Alvis Lemmings     History of Present Illness: [name] [name] is a 59 year old female with a history of Fibromyalgia,systemic lupus erythematosus with interstitial lung disease, fibromyalgia, GERD, seizures, migraineswho, history of cystoscopy, left ureteroscopy with laser lithotripsy and stent exchange here for an acute visit. She complains of dysuria for greater than 1 week.  She took leftover Cefdinir tablets but is still symptomatic.  Denies presence of abdominal pain, nausea or vomiting but states she does have flank pain.  She has had recurrent UTIs. She sees Urogynecology at [facility] next year to evaluate this.  On 09/09/20 she received the Pfizer vaccine and states she had a fever of 100 which has improved to a temperature of 98 degrees.  Past Medical History:  Diagnosis Date  . Anemia   . CHF (congestive heart failure) (HCC)   . Chronic kidney disease   . Chronic pain   . COPD (chronic obstructive pulmonary disease) (HCC)   . Fibromyalgia   . Foley catheter in place   . History of kidney stones   . Migraine   . Migraine without aura, with intractable migraine, so stated, without mention of status migrainosus 05/18/2014  . Oxygen dependent    as needed 2L at hs per patient   .  Peripheral vascular disease (HCC)   . Pneumonia   . PONV (postoperative nausea and vomiting)   . Pulmonary fibrosis (HCC)   . Seizures (HCC)   . SLE (systemic lupus erythematosus) (HCC) 12/12/2017  . UTI (urinary tract infection) 09/30/2018   Allergies  Allergen Reactions  . Shellfish Allergy Anaphylaxis  . Other     Blood Product Refusal   . Pregabalin Swelling    Able to tolerate Lyrica (brand name only)  . Gabapentin Rash    Current Outpatient Medications on File Prior to Visit  Medication Sig Dispense Refill  . atorvastatin (LIPITOR) 80 MG tablet Take 1 tablet (80 mg total) by mouth daily. 90 tablet 1  . Cyanocobalamin (B-12) 2000 MCG TABS Take 2,000 mcg by mouth daily. 30 tablet 3  . etodolac (LODINE) 500 MG tablet Take 1 tablet (500 mg total) by mouth 2 (two) times daily. 30 tablet 0  . fentaNYL (DURAGESIC - DOSED MCG/HR) 50 MCG/HR Place 1 patch (50 mcg total) onto the skin every 3 (three) days. 5 patch 0  . fluconazole (DIFLUCAN) 150 MG tablet Take 1 tablet (150 mg total) by mouth daily. 7 tablet 0  . folic acid (FOLVITE) 1 MG tablet Take 1 tablet (1 mg total) by mouth daily. 90 tablet 3  . furosemide (LASIX) 20 MG tablet Take 1 tablet (20 mg total) by mouth every Monday, Wednesday, and Friday. 90 tablet 0  . hydroxychloroquine (PLAQUENIL) 200 MG tablet Take 200-400 mg by mouth See admin instructions. Take 200 mg by mouth every other day alternating with 400 mg (  2 tablets) every other day    . levETIRAcetam (KEPPRA) 500 MG tablet TAKE 1 TABLET(500 MG) BY MOUTH TWICE DAILY 180 tablet 3  . mycophenolate (MYFORTIC) 360 MG TBEC EC tablet Take 360-720 mg by mouth See admin instructions. Take 720mg  in the am and 360mg  in pm    . Naloxone HCl 0.4 MG/0.4ML SOAJ Inject 0.4 mLs into the muscle as needed (overdose).     . OXYGEN Inhale 2 L/min into the lungs at bedtime.    . pantoprazole (PROTONIX) 40 MG tablet TAKE 1 TABLET(40 MG) BY MOUTH TWICE DAILY 180 tablet 1  . polyethylene glycol  (MIRALAX / GLYCOLAX) packet Take 17 g by mouth daily as needed for mild constipation or moderate constipation. 14 each 0  . pregabalin (LYRICA) 300 MG capsule Take 300 mg by mouth 2 (two) times daily.    . rizatriptan (MAXALT) 10 MG tablet Take 1 tab at the onset of migraine.May repeat in 2 hours if needed. Not to exceed 2tabs/24 hours 10 tablet 5  . SYMBICORT 80-4.5 MCG/ACT inhaler Inhale 2 puffs into the lungs 2 (two) times daily.   1  . topiramate (TOPAMAX) 100 MG tablet Take 1 tablet (100 mg total) by mouth daily. 90 tablet 3  . traMADol (ULTRAM) 50 MG tablet Take 1-2 tablets (50-100 mg total) by mouth every 4 (four) hours as needed (cough). (Patient taking differently: Take 50-100 mg by mouth every 6 (six) hours as needed for moderate pain.) 84 tablet 0  . Vitamin D, Ergocalciferol, (DRISDOL) 50000 units CAPS capsule Take 1 capsule (50,000 Units total) by mouth every 7 (seven) days. (Patient taking differently: Take 50,000 Units by mouth every Saturday.) 12 capsule 3  . cefdinir (OMNICEF) 300 MG capsule Take 1 capsule (300 mg total) by mouth 2 (two) times daily. (Patient not taking: Reported on 09/14/2020) 20 capsule 0  . megestrol (MEGACE) 40 MG/ML suspension SHAKE LIQUID AND TAKE 10 ML(400 MG) BY MOUTH DAILY (Patient not taking: Reported on 07/16/2018) 480 mL 0  . nepafenac (ILEVRO) 0.3 % ophthalmic suspension Place 1 drop into both eyes every other day.     . Potassium Chloride ER 20 MEQ TBCR Take 20 mEq by mouth daily. (Patient not taking: No sig reported) 30 tablet 6  . prednisoLONE acetate (PRED FORTE) 1 % ophthalmic suspension Place 1 drop into the left eye 4 (four) times daily.    . predniSONE (DELTASONE) 10 MG tablet Take 7.5 mg by mouth daily.  (Patient not taking: No sig reported)  3  . ranitidine (ZANTAC) 150 MG capsule Take 150 mg by mouth 2 (two) times daily.     No current facility-administered medications on file prior to visit.    Observations/Objective: Awake, alert,  oriented x3 Not in acute distress  Assessment and Plan: 1. Acute cystitis without hematuria Recurrent UTIs Prescription for Omnicef sent to pharmacy - cefdinir (OMNICEF) 300 MG capsule; Take 1 capsule (300 mg total) by mouth 2 (two) times daily.  Dispense: 20 capsule; Refill: 0   Follow Up Instructions: Keep previously scheduled appointment.   I discussed the assessment and treatment plan with the patient. The patient was provided an opportunity to ask questions and all were answered. The patient agreed with the plan and demonstrated an understanding of the instructions.   The patient was advised to call back or seek an in-person evaluation if the symptoms worsen or if the condition fails to improve as anticipated.     I provided 12 minutes total of  non-face-to-face time during this encounter including median intraservice time, reviewing previous notes, investigations, ordering medications, medical decision making, coordinating care and patient verbalized understanding at the end of the visit.     Hoy Register, MD, FAAFP. [facility] [place], [place] [phone]   09/14/2020, 1:32 PM

## 2020-10-20 ENCOUNTER — Ambulatory Visit: Payer: Medicaid Other | Admitting: Adult Health

## 2021-02-21 ENCOUNTER — Telehealth: Payer: Self-pay | Admitting: Adult Health

## 2021-02-21 NOTE — Telephone Encounter (Signed)
6/15 OV cancelled due to NP being out of office.

## 2021-03-02 ENCOUNTER — Ambulatory Visit: Payer: Medicaid Other | Admitting: Adult Health

## 2021-04-06 ENCOUNTER — Encounter: Payer: Self-pay | Admitting: Neurology

## 2021-04-06 ENCOUNTER — Ambulatory Visit: Payer: Medicaid Other | Admitting: Neurology

## 2021-04-06 ENCOUNTER — Other Ambulatory Visit: Payer: Self-pay

## 2021-04-06 VITALS — BP 124/82 | HR 59 | Ht 61.0 in | Wt 141.2 lb

## 2021-04-06 DIAGNOSIS — R569 Unspecified convulsions: Secondary | ICD-10-CM

## 2021-04-06 DIAGNOSIS — G40909 Epilepsy, unspecified, not intractable, without status epilepticus: Secondary | ICD-10-CM | POA: Diagnosis not present

## 2021-04-06 DIAGNOSIS — G43019 Migraine without aura, intractable, without status migrainosus: Secondary | ICD-10-CM | POA: Diagnosis not present

## 2021-04-06 MED ORDER — LEVETIRACETAM 500 MG PO TABS: ORAL_TABLET | ORAL | 3 refills | Status: DC

## 2021-04-06 MED ORDER — TOPIRAMATE 100 MG PO TABS: 100.0000 mg | ORAL_TABLET | Freq: Every day | ORAL | 3 refills | Status: DC

## 2021-04-06 MED ORDER — AIMOVIG 140 MG/ML ~~LOC~~ SOAJ: 140.0000 mg | SUBCUTANEOUS | 4 refills | Status: DC

## 2021-04-06 MED ORDER — RIZATRIPTAN BENZOATE 10 MG PO TABS: ORAL_TABLET | ORAL | 5 refills | Status: DC

## 2021-04-06 NOTE — Progress Notes (Signed)
Reason for visit: Seizures, migraine, interstitial lung disease, lupus  [name] [name] is an 60 y.o. female  History of present illness:  [name] [name] is a 60 year old right-handed [place] female with a history of lupus associated with interstitial lung disease.  The patient has seizures that have been well controlled over the years, and she has migraine headaches.  Her headaches have become more frequent recently, she is now having 3 to 4 a week.  She is on Topamax, she has been on gabapentin and Lyrica previously.  The patient takes Keppra as well for her seizures.  The patient gets a lot of photophobia and phonophobia with her headaches, nausea without vomiting.  She oftentimes has to lie down with the headache and cannot function.  She reports that she has had frequent urinary tract infections in the past, she has now on an antibiotic for this reason.  The patient comes back here for further evaluation.  Past Medical History:  Diagnosis Date   Anemia    CHF (congestive heart failure) (HCC)    Chronic kidney disease    Chronic pain    COPD (chronic obstructive pulmonary disease) (HCC)    Fibromyalgia    Foley catheter in place    History of kidney stones    Migraine    Migraine without aura, with intractable migraine, so stated, without mention of status migrainosus 05/18/2014   Oxygen dependent    as needed 2L at hs per patient    Peripheral vascular disease (HCC)    Pneumonia    PONV (postoperative nausea and vomiting)    Pulmonary fibrosis (HCC)    Seizures (HCC)    SLE (systemic lupus erythematosus) (HCC) 12/12/2017   UTI (urinary tract infection) 09/30/2018    Past Surgical History:  Procedure Laterality Date   ABDOMINAL HYSTERECTOMY     CHOLECYSTECTOMY     CYSTOSCOPY W/ URETERAL STENT PLACEMENT Left 10/03/2018   Procedure: CYSTOSCOPY WITH RETROGRADE PYELOGRAM/URETERAL STENT PLACEMENT;  Surgeon: Jerilee Field, MD;  Location: [facility];  Service: Urology;  Laterality:  Left;   CYSTOSCOPY/URETEROSCOPY/HOLMIUM LASER/STENT PLACEMENT Left 11/05/2018   Procedure: CYSTOSCOPY/URETEROSCOPY/HOLMIUM LASER/STENT EXCHANGE;  Surgeon: Jerilee Field, MD;  Location: [facility];  Service: Urology;  Laterality: Left;  ONLY NEEDS 60 MIN   EYE SURGERY     bilateral cataract surgery    Kidney stome surgery  2012   Left lung biopsy  2015   LUNG BIOPSY Left 11/21/2013   Procedure: LUNG BIOPSY;  Surgeon: Delight Ovens, MD;  Location: [facility] OR;  Service: Thoracic;  Laterality: Left;   VIDEO ASSISTED THORACOSCOPY Left 11/21/2013   Procedure: VIDEO ASSISTED THORACOSCOPY;  Surgeon: Delight Ovens, MD;  Location: [facility] OR;  Service: Thoracic;  Laterality: Left;   VIDEO BRONCHOSCOPY N/A 11/21/2013   Procedure: VIDEO BRONCHOSCOPY;  Surgeon: Delight Ovens, MD;  Location: [facility] OR;  Service: Thoracic;  Laterality: N/A;    Family History  Problem Relation Age of Onset   Hypertension Mother    Hepatitis C Mother    Diabetes Father    Hypertension Father    Bipolar disorder Brother    Multiple sclerosis Sister     Social history:  reports that she quit smoking about 15 years ago. Her smoking use included cigarettes. She has a 40.00 pack-year smoking history. She has never used smokeless tobacco. She reports that she does not drink alcohol and does not use drugs.    Allergies  Allergen Reactions   Shellfish Allergy Anaphylaxis  Other     Blood Product Refusal    Pregabalin Swelling    Able to tolerate Lyrica (brand name only)   Gabapentin Rash    Medications:  Prior to Admission medications   Medication Sig Start Date End Date Taking? Authorizing Provider  atorvastatin (LIPITOR) 80 MG tablet Take 1 tablet (80 mg total) by mouth daily. 06/15/20   Hoy Register, MD  cefdinir (OMNICEF) 300 MG capsule Take 1 capsule (300 mg total) by mouth 2 (two) times daily. 09/14/20   Hoy Register, MD  Cyanocobalamin (B-12) 2000 MCG TABS Take 2,000 mcg by mouth daily. 08/29/17   Quentin Angst, MD  etodolac (LODINE) 500 MG tablet Take 1 tablet (500 mg total) by mouth 2 (two) times daily. 04/28/14   Nyoka Cowden, MD  fentaNYL (DURAGESIC - DOSED MCG/HR) 50 MCG/HR Place 1 patch (50 mcg total) onto the skin every 3 (three) days. 04/28/14   Nyoka Cowden, MD  fluconazole (DIFLUCAN) 150 MG tablet Take 1 tablet (150 mg total) by mouth daily. 06/14/20   Hoy Register, MD  folic acid (FOLVITE) 1 MG tablet Take 1 tablet (1 mg total) by mouth daily. 08/29/17   Quentin Angst, MD  furosemide (LASIX) 20 MG tablet Take 1 tablet (20 mg total) by mouth every Monday, Wednesday, and Friday. 06/14/20   Hoy Register, MD  hydroxychloroquine (PLAQUENIL) 200 MG tablet Take 200-400 mg by mouth See admin instructions. Take 200 mg by mouth every other day alternating with 400 mg (2 tablets) every other day 10/29/17   [provider]  levETIRAcetam (KEPPRA) 500 MG tablet TAKE 1 TABLET(500 MG) BY MOUTH TWICE DAILY 04/13/20   Glean Salvo, NP  megestrol (MEGACE) 40 MG/ML suspension SHAKE LIQUID AND TAKE 10 ML(400 MG) BY MOUTH DAILY Patient not taking: Reported on 07/16/2018 11/23/17   Quentin Angst, MD  mycophenolate (MYFORTIC) 360 MG TBEC EC tablet Take 360-720 mg by mouth See admin instructions. Take 720mg  in the am and 360mg  in pm 08/12/18   [provider]  Naloxone HCl 0.4 MG/0.4ML SOAJ Inject 0.4 mLs into the muscle as needed (overdose).  04/05/15   [provider]  nepafenac (ILEVRO) 0.3 % ophthalmic suspension Place 1 drop into both eyes every other day.  07/11/18   [provider]  OXYGEN Inhale 2 L/min into the lungs at bedtime.    [provider]  pantoprazole (PROTONIX) 40 MG tablet TAKE 1 TABLET(40 MG) BY MOUTH TWICE DAILY 06/14/20   07/13/18, MD  polyethylene glycol (MIRALAX / GLYCOLAX) packet Take 17 g by mouth daily as needed for mild constipation or moderate constipation. 03/06/14   Hoy Register, NP  Potassium Chloride  ER 20 MEQ TBCR Take 20 mEq by mouth daily. Patient not taking: No sig reported 08/06/18   Ambrose Finland, MD  prednisoLONE acetate (PRED FORTE) 1 % ophthalmic suspension Place 1 drop into the left eye 4 (four) times daily. 09/12/18   [provider]  predniSONE (DELTASONE) 10 MG tablet Take 7.5 mg by mouth daily.  Patient not taking: No sig reported 06/28/17   [provider]  pregabalin (LYRICA) 300 MG capsule Take 300 mg by mouth 2 (two) times daily.    [provider]  ranitidine (ZANTAC) 150 MG capsule Take 150 mg by mouth 2 (two) times daily. 07/31/18 07/31/19  [provider]  rizatriptan (MAXALT) 10 MG tablet Take 1 tab at the onset of migraine.May repeat in 2 hours if needed.  Not to exceed 2tabs/24 hours 04/13/20   Glean Salvo, NP  SYMBICORT 80-4.5 MCG/ACT inhaler Inhale 2 puffs into the lungs 2 (two) times daily.  02/09/17   [provider]  topiramate (TOPAMAX) 100 MG tablet Take 1 tablet (100 mg total) by mouth daily. 04/13/20   Glean Salvo, NP  traMADol (ULTRAM) 50 MG tablet Take 1-2 tablets (50-100 mg total) by mouth every 4 (four) hours as needed (cough). Patient taking differently: Take 50-100 mg by mouth every 6 (six) hours as needed for moderate pain. 04/28/14   Nyoka Cowden, MD  Vitamin D, Ergocalciferol, (DRISDOL) 50000 units CAPS capsule Take 1 capsule (50,000 Units total) by mouth every 7 (seven) days. Patient taking differently: Take 50,000 Units by mouth every Saturday. 03/20/18   Hoy Register, MD    ROS:  Out of a complete 14 system review of symptoms, the patient complains only of the following symptoms, and all other reviewed systems are negative.  Headache History of seizures Shortness of breath Walking difficulty  Blood pressure 124/82, pulse (!) 59, height 5\' 1"  (1.549 m), weight 141 lb 4 oz (64.1 kg), SpO2 98 %.  Physical Exam  General: The patient is alert and cooperative at the time of the  examination.  Skin: No significant peripheral edema is noted.   Neurologic Exam  Mental status: The patient is alert and oriented x 3 at the time of the examination. The patient has apparent normal recent and remote memory, with an apparently normal attention span and concentration ability.   Cranial nerves: Facial symmetry is present. Speech is normal, no aphasia or dysarthria is noted. Extraocular movements are full. Visual fields are full.  Motor: The patient has good strength in all 4 extremities.  Sensory examination: Soft touch sensation is symmetric on the face, arms, and legs.  Coordination: The patient has good finger-nose-finger and heel-to-shin bilaterally.  Gait and station: The patient has a slightly wide-based gait, unsteady gait.  Patient normally uses a walker for ambulation.  Tandem gait was not attempted.  Romberg is negative but is unsteady.  Reflexes: Deep tendon reflexes are symmetric.   Assessment/Plan:  1.  History of seizures, well controlled  2.  Intractable migraine  The patient is having frequent migraine headaches at this point.  She will be placed on Aimovig 140 mg every 30 days.  The patient was given prescriptions for her Topamax, Keppra, and Maxalt.  She will follow-up here in 6 months.  She will call for any dose adjustments.  In the future, she can be followed by Dr. .  Lucia Gaskins MD 04/06/2021 2:28 PM  [facility] [address] [place], [place]  Phone [phone] Fax [phone]

## 2021-04-25 ENCOUNTER — Telehealth (HOSPITAL_COMMUNITY): Payer: Self-pay | Admitting: *Deleted

## 2021-04-25 NOTE — Telephone Encounter (Signed)
Received referral from Dr. Evelina Dun for this pt to participate in Pulmonary rehab. However medicaid - [place] [facility] does not cover Pulmonary rehab.  Called and spoke to pt extensively regarding medicaid not covering pulmonary rehab and the option for Pulmonary Rehab Maintenance(self pay).  Pt is interested but hesitant due to needing to have "major surgery"  Initially she spoke about a blockage in her heart.  After drilling down and asking questions and reviewing notes in CareEverywhere, I was able to determine that she need an Aortabifem.  I reviewed notes from last year where she was seen by Dr. Delia Heady and surgery was recommended however pt later called and had changed her mind and did not revisit this until now.  Seems that she will need a CT of the chest - she is reluctant since she had CT done last month for monitoring lung nodule and is afraid of having too much radiation. Gave her the phone number for Dr. Harlon Flor office to ask about the CT and get scheduled for an office visit to start the process again for surgery. Asked how far she is able to walk when symptoms occur.  Pt states that when she walks just a few feet she has cramping and heaviness in her legs.  Feel that exercise although is indicated for PAD is not appropriate for her to the severity of he PAD.  Pt agreed and would like to do pulmonary rehab maintenance after her procedure.  Advised pt that she will need support i.e. rehab/snf  upon discharge from the hospital. Caly does not drive, have any family in [place].She lives with sister who works during the day.  Rehab post discharge will help toward independence, increase stamina and strength so that she will be safe in the home by her self for long periods of time. Asked that she keep me updated - contact number provided. Also called Dr. Evelina Dun office and left message regarding this conversation. Alanson Aly, BSN Cardiac and Emergency planning/management officer

## 2021-06-01 ENCOUNTER — Telehealth: Payer: Self-pay | Admitting: Neurology

## 2021-06-01 MED ORDER — AIMOVIG 140 MG/ML ~~LOC~~ SOAJ: 140.0000 mg | SUBCUTANEOUS | 4 refills | Status: DC

## 2021-06-01 NOTE — Telephone Encounter (Signed)
I called the pt back and left a vm per note from Dr. Anne Hahn on 04/06/2021 She will be placed on Aimovig 140 mg every 30 days.   I advised rx was sent on this date and to cb so we could further discuss.

## 2021-06-01 NOTE — Telephone Encounter (Signed)
Pt called asking about an injection medication that was supposed to be called into her pharmacy. Pt requesting a call back.

## 2021-06-01 NOTE — Telephone Encounter (Signed)
Pt called back she sts pharmacy never received the rx from 04/06/21 and she had forgot it was supposed to be sent in till recently.  Pt was advised I would send again to the [facility] on [address] and advised her to CB if she has any issues.  This medication probably requires a PA, will submit a proactive PA for this through [facility] medicaid.

## 2021-06-01 NOTE — Addendum Note (Signed)
Addended by: Ann Maki on: 06/01/2021 03:33 PM   Modules accepted: Orders

## 2021-06-02 NOTE — Telephone Encounter (Signed)
PA has been signed and faxed to [facility], number # [phone]. Confirmation received.

## 2021-06-06 NOTE — Telephone Encounter (Signed)
Pt has called asking that RN reaches out to [facility] for an update re:the status of the PA for her Aimovig.

## 2021-06-06 NOTE — Telephone Encounter (Signed)
Aimovig approved, Confirmation #:[id number] W.  Effective: 06/06/21-09/04/21  Sent patient a mychart message making her aware.

## 2021-06-08 ENCOUNTER — Other Ambulatory Visit: Payer: Self-pay | Admitting: Family Medicine

## 2021-06-08 ENCOUNTER — Other Ambulatory Visit (HOSPITAL_COMMUNITY)
Admission: RE | Admit: 2021-06-08 | Discharge: 2021-06-08 | Disposition: A | Discharge status: Home / Self Care | Payer: Medicaid Other | Source: Ambulatory Visit | Attending: Family Medicine | Admitting: Family Medicine

## 2021-06-08 ENCOUNTER — Ambulatory Visit: Payer: Medicaid Other | Attending: Family Medicine | Admitting: Family Medicine

## 2021-06-08 ENCOUNTER — Other Ambulatory Visit: Payer: Self-pay

## 2021-06-08 ENCOUNTER — Encounter: Payer: Self-pay | Admitting: Family Medicine

## 2021-06-08 VITALS — BP 111/82 | HR 70 | Resp 16 | Wt 167.0 lb

## 2021-06-08 DIAGNOSIS — I745 Embolism and thrombosis of iliac artery: Secondary | ICD-10-CM | POA: Diagnosis not present

## 2021-06-08 DIAGNOSIS — K219 Gastro-esophageal reflux disease without esophagitis: Secondary | ICD-10-CM | POA: Diagnosis not present

## 2021-06-08 DIAGNOSIS — Z23 Encounter for immunization: Secondary | ICD-10-CM | POA: Diagnosis not present

## 2021-06-08 DIAGNOSIS — Z124 Encounter for screening for malignant neoplasm of cervix: Secondary | ICD-10-CM | POA: Diagnosis present

## 2021-06-08 DIAGNOSIS — R569 Unspecified convulsions: Secondary | ICD-10-CM | POA: Insufficient documentation

## 2021-06-08 DIAGNOSIS — M3213 Lung involvement in systemic lupus erythematosus: Secondary | ICD-10-CM | POA: Insufficient documentation

## 2021-06-08 DIAGNOSIS — J984 Other disorders of lung: Secondary | ICD-10-CM | POA: Diagnosis not present

## 2021-06-08 DIAGNOSIS — Z87440 Personal history of urinary (tract) infections: Secondary | ICD-10-CM | POA: Diagnosis not present

## 2021-06-08 DIAGNOSIS — M797 Fibromyalgia: Secondary | ICD-10-CM | POA: Insufficient documentation

## 2021-06-08 DIAGNOSIS — Z79899 Other long term (current) drug therapy: Secondary | ICD-10-CM | POA: Diagnosis not present

## 2021-06-08 DIAGNOSIS — Z0000 Encounter for general adult medical examination without abnormal findings: Secondary | ICD-10-CM | POA: Diagnosis not present

## 2021-06-08 DIAGNOSIS — Z7951 Long term (current) use of inhaled steroids: Secondary | ICD-10-CM | POA: Diagnosis not present

## 2021-06-08 DIAGNOSIS — Z9981 Dependence on supplemental oxygen: Secondary | ICD-10-CM | POA: Insufficient documentation

## 2021-06-08 DIAGNOSIS — Z79891 Long term (current) use of opiate analgesic: Secondary | ICD-10-CM | POA: Insufficient documentation

## 2021-06-08 DIAGNOSIS — Z01419 Encounter for gynecological examination (general) (routine) without abnormal findings: Secondary | ICD-10-CM | POA: Insufficient documentation

## 2021-06-08 DIAGNOSIS — Z90710 Acquired absence of both cervix and uterus: Secondary | ICD-10-CM | POA: Insufficient documentation

## 2021-06-08 DIAGNOSIS — Z888 Allergy status to other drugs, medicaments and biological substances status: Secondary | ICD-10-CM | POA: Diagnosis not present

## 2021-06-08 DIAGNOSIS — Z7952 Long term (current) use of systemic steroids: Secondary | ICD-10-CM | POA: Insufficient documentation

## 2021-06-08 DIAGNOSIS — Z8542 Personal history of malignant neoplasm of other parts of uterus: Secondary | ICD-10-CM | POA: Diagnosis not present

## 2021-06-08 DIAGNOSIS — Z1231 Encounter for screening mammogram for malignant neoplasm of breast: Secondary | ICD-10-CM

## 2021-06-08 DIAGNOSIS — I739 Peripheral vascular disease, unspecified: Secondary | ICD-10-CM | POA: Insufficient documentation

## 2021-06-08 DIAGNOSIS — Z7182 Exercise counseling: Secondary | ICD-10-CM | POA: Insufficient documentation

## 2021-06-08 DIAGNOSIS — K2100 Gastro-esophageal reflux disease with esophagitis, without bleeding: Secondary | ICD-10-CM

## 2021-06-08 NOTE — Patient Instructions (Signed)
Health Maintenance, Female Adopting a healthy lifestyle and getting preventive care are important in promoting health and wellness. Ask your health care provider about: The right schedule for you to have regular tests and exams. Things you can do on your own to prevent diseases and keep yourself healthy. What should I know about diet, weight, and exercise? Eat a healthy diet  Eat a diet that includes plenty of vegetables, fruits, low-fat dairy products, and lean protein. Do not eat a lot of foods that are high in solid fats, added sugars, or sodium. Maintain a healthy weight Body mass index (BMI) is used to identify weight problems. It estimates body fat based on height and weight. Your health care provider can help determine your BMI and help you achieve or maintain a healthy weight. Get regular exercise Get regular exercise. This is one of the most important things you can do for your health. Most adults should: Exercise for at least 150 minutes each week. The exercise should increase your heart rate and make you sweat (moderate-intensity exercise). Do strengthening exercises at least twice a week. This is in addition to the moderate-intensity exercise. Spend less time sitting. Even light physical activity can be beneficial. Watch cholesterol and blood lipids Have your blood tested for lipids and cholesterol at 59 years of age, then have this test every 5 years. Have your cholesterol levels checked more often if: Your lipid or cholesterol levels are high. You are older than 59 years of age. You are at high risk for heart disease. What should I know about cancer screening? Depending on your health history and family history, you may need to have cancer screening at various ages. This may include screening for: Breast cancer. Cervical cancer. Colorectal cancer. Skin cancer. Lung cancer. What should I know about heart disease, diabetes, and high blood pressure? Blood pressure and heart  disease High blood pressure causes heart disease and increases the risk of stroke. This is more likely to develop in people who have high blood pressure readings, are of African descent, or are overweight. Have your blood pressure checked: Every 3-5 years if you are 18-39 years of age. Every year if you are 40 years old or older. Diabetes Have regular diabetes screenings. This checks your fasting blood sugar level. Have the screening done: Once every three years after age 40 if you are at a normal weight and have a low risk for diabetes. More often and at a younger age if you are overweight or have a high risk for diabetes. What should I know about preventing infection? Hepatitis B If you have a higher risk for hepatitis B, you should be screened for this virus. Talk with your health care provider to find out if you are at risk for hepatitis B infection. Hepatitis C Testing is recommended for: Everyone born from 1945 through 1965. Anyone with known risk factors for hepatitis C. Sexually transmitted infections (STIs) Get screened for STIs, including gonorrhea and chlamydia, if: You are sexually active and are younger than 59 years of age. You are older than 59 years of age and your health care provider tells you that you are at risk for this type of infection. Your sexual activity has changed since you were last screened, and you are at increased risk for chlamydia or gonorrhea. Ask your health care provider if you are at risk. Ask your health care provider about whether you are at high risk for HIV. Your health care provider may recommend a prescription medicine   to help prevent HIV infection. If you choose to take medicine to prevent HIV, you should first get tested for HIV. You should then be tested every 3 months for as long as you are taking the medicine. Pregnancy If you are about to stop having your period (premenopausal) and you may become pregnant, seek counseling before you get  pregnant. Take 400 to 800 micrograms (mcg) of folic acid every day if you become pregnant. Ask for birth control (contraception) if you want to prevent pregnancy. Osteoporosis and menopause Osteoporosis is a disease in which the bones lose minerals and strength with aging. This can result in bone fractures. If you are 65 years old or older, or if you are at risk for osteoporosis and fractures, ask your health care provider if you should: Be screened for bone loss. Take a calcium or vitamin D supplement to lower your risk of fractures. Be given hormone replacement therapy (HRT) to treat symptoms of menopause. Follow these instructions at home: Lifestyle Do not use any products that contain nicotine or tobacco, such as cigarettes, e-cigarettes, and chewing tobacco. If you need help quitting, ask your health care provider. Do not use street drugs. Do not share needles. Ask your health care provider for help if you need support or information about quitting drugs. Alcohol use Do not drink alcohol if: Your health care provider tells you not to drink. You are pregnant, may be pregnant, or are planning to become pregnant. If you drink alcohol: Limit how much you use to 0-1 drink a day. Limit intake if you are breastfeeding. Be aware of how much alcohol is in your drink. In the [place] one drink equals one 12 oz bottle of beer (355 mL), one 5 oz glass of wine (148 mL), or one 1 oz glass of hard liquor (44 mL). General instructions Schedule regular health, dental, and eye exams. Stay current with your vaccines. Tell your health care provider if: You often feel depressed. You have ever been abused or do not feel safe at home. Summary Adopting a healthy lifestyle and getting preventive care are important in promoting health and wellness. Follow your health care provider's instructions about healthy diet, exercising, and getting tested or screened for diseases. Follow your health care provider's  instructions on monitoring your cholesterol and blood pressure. This information is not intended to replace advice given to you by your health care provider. Make sure you discuss any questions you have with your health care provider. Document Revised: 11/12/2020 Document Reviewed: 08/28/2018 Elsevier Patient Education  2022 [employer]  

## 2021-06-08 NOTE — Progress Notes (Signed)
Subjective:  Patient ID: [name] [name], female    DOB: Jul 11, 1961  Age: 60 y.o. MRN: [id number]  CC: Gynecologic Exam and Immunizations   HPI [name] [name] is a 60 y.o. year old female with a history of Fibromyalgia,systemic lupus erythematosus with interstitial lung disease, fibromyalgia, GERD, seizures, migraines who , history of cystoscopy, left ureteroscopy with laser lithotripsy and stent exchange, peripheral arterial disease, s/pTAH in the 90s (secondary to ?[place]ometrial [place] in [place]  Interval History: She presents today for complete physical exam and is due for Pap smear and mammogram.  She informs me she had a Pap smear by GYN and this is reflected in her chart in 06/2016 with results revealing NILM, HPV-. she would also like to receive the shingles vaccine.  Up-to-date on colonoscopy. She is currently closely followed by her specialists at [facility], Pulmonary, Rheumatology.  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 Past Medical History:  Diagnosis Date   Anemia    CHF (congestive heart failure) (HCC)    Chronic kidney disease    Chronic pain    COPD (chronic obstructive pulmonary disease) (HCC)    Fibromyalgia    Foley catheter in place    History of kidney stones    Migraine    Migraine without aura, with intractable migraine, so stated, without mention of status migrainosus 05/18/2014   Oxygen dependent    as needed 2L at hs per patient    Peripheral vascular disease (HCC)    Pneumonia    PONV (postoperative nausea and vomiting)    Pulmonary fibrosis (HCC)    Seizures (HCC)    SLE (systemic lupus erythematosus) (HCC) 12/12/2017   UTI (urinary tract infection) 09/30/2018    Past Surgical History:  Procedure Laterality Date   ABDOMINAL  HYSTERECTOMY     CHOLECYSTECTOMY     CYSTOSCOPY W/ URETERAL STENT PLACEMENT Left 10/03/2018   Procedure: CYSTOSCOPY WITH RETROGRADE PYELOGRAM/URETERAL STENT PLACEMENT;  Surgeon: Jerilee Field, MD;  Location: [facility];  Service: Urology;  Laterality: Left;   CYSTOSCOPY/URETEROSCOPY/HOLMIUM LASER/STENT PLACEMENT Left 11/05/2018   Procedure: CYSTOSCOPY/URETEROSCOPY/HOLMIUM LASER/STENT EXCHANGE;  Surgeon: Jerilee Field, MD;  Location: [facility];  Service: Urology;  Laterality: Left;  ONLY NEEDS 60 MIN   EYE SURGERY     bilateral cataract surgery    Kidney stome surgery  2012   Left lung biopsy  2015   LUNG BIOPSY Left 11/21/2013   Procedure: LUNG BIOPSY;  Surgeon: Delight Ovens, MD;  Location: [facility] OR;  Service: Thoracic;  Laterality: Left;   VIDEO ASSISTED THORACOSCOPY Left 11/21/2013   Procedure: VIDEO ASSISTED THORACOSCOPY;  Surgeon: Delight Ovens, MD;  Location: [facility] OR;  Service: Thoracic;  Laterality: Left;   VIDEO BRONCHOSCOPY N/A 11/21/2013   Procedure: VIDEO BRONCHOSCOPY;  Surgeon: Delight Ovens, MD;  Location: [facility] OR;  Service: Thoracic;  Laterality: N/A;    Family History  Problem Relation Age of Onset   Hypertension Mother    Hepatitis C Mother    Diabetes Father    Hypertension Father    Bipolar disorder Brother    Multiple sclerosis Sister     Allergies  Allergen Reactions   Shellfish Allergy Anaphylaxis   Other     Blood Product Refusal    Pregabalin Swelling  Able to tolerate Lyrica (brand name only)   Gabapentin Rash    Outpatient Medications Prior to Visit  Medication Sig Dispense Refill   atorvastatin (LIPITOR) 80 MG tablet Take 1 tablet (80 mg total) by mouth daily. 90 tablet 1   cefdinir (OMNICEF) 300 MG capsule Take 1 capsule (300 mg total) by mouth 2 (two) times daily. 20 capsule 0   Cyanocobalamin (B-12) 2000 MCG TABS Take 2,000 mcg by mouth daily. 30 tablet 3   Erenumab-aooe (AIMOVIG) 140 MG/ML SOAJ Inject 140 mg into the skin every 30 (thirty)  days. 1.12 mL 4   etodolac (LODINE) 500 MG tablet Take 1 tablet (500 mg total) by mouth 2 (two) times daily. 30 tablet 0   fentaNYL (DURAGESIC - DOSED MCG/HR) 50 MCG/HR Place 1 patch (50 mcg total) onto the skin every 3 (three) days. 5 patch 0   folic acid (FOLVITE) 1 MG tablet Take 1 tablet (1 mg total) by mouth daily. 90 tablet 3   furosemide (LASIX) 20 MG tablet Take 1 tablet (20 mg total) by mouth every Monday, Wednesday, and Friday. 90 tablet 0   hydroxychloroquine (PLAQUENIL) 200 MG tablet Take 200-400 mg by mouth See admin instructions. Take 200 mg by mouth every other day alternating with 400 mg (2 tablets) every other day     levETIRAcetam (KEPPRA) 500 MG tablet TAKE 1 TABLET(500 MG) BY MOUTH TWICE DAILY 180 tablet 3   Naloxone HCl 0.4 MG/0.4ML SOAJ Inject 0.4 mLs into the muscle as needed (overdose).      OXYGEN Inhale 2 L/min into the lungs at bedtime.     pantoprazole (PROTONIX) 40 MG tablet TAKE 1 TABLET(40 MG) BY MOUTH TWICE DAILY 180 tablet 1   polyethylene glycol (MIRALAX / GLYCOLAX) packet Take 17 g by mouth daily as needed for mild constipation or moderate constipation. 14 each 0   predniSONE (DELTASONE) 2.5 MG tablet Take 2.5 mg by mouth daily with breakfast.     pregabalin (LYRICA) 300 MG capsule Take 300 mg by mouth 2 (two) times daily.     rizatriptan (MAXALT) 10 MG tablet Take 1 tab at the onset of migraine.May repeat in 2 hours if needed. Not to exceed 2tabs/24 hours 10 tablet 5   SYMBICORT 80-4.5 MCG/ACT inhaler Inhale 2 puffs into the lungs 2 (two) times daily.   1   topiramate (TOPAMAX) 100 MG tablet Take 1 tablet (100 mg total) by mouth daily. 90 tablet 3   traMADol (ULTRAM) 50 MG tablet Take 1-2 tablets (50-100 mg total) by mouth every 4 (four) hours as needed (cough). (Patient taking differently: Take 50-100 mg by mouth every 6 (six) hours as needed for moderate pain.) 84 tablet 0   Vitamin D, Ergocalciferol, (DRISDOL) 50000 units CAPS capsule Take 1 capsule (50,000  Units total) by mouth every 7 (seven) days. (Patient taking differently: Take 50,000 Units by mouth every Saturday.) 12 capsule 3   fluconazole (DIFLUCAN) 150 MG tablet Take 1 tablet (150 mg total) by mouth daily. (Patient not taking: Reported on 06/08/2021) 7 tablet 0   mycophenolate (MYFORTIC) 360 MG TBEC EC tablet Take 360-720 mg by mouth See admin instructions. Take 720mg  in the am and 360mg  in pm (Patient not taking: Reported on 06/08/2021)     ranitidine (ZANTAC) 150 MG capsule Take 150 mg by mouth 2 (two) times daily.     No facility-administered medications prior to visit.     ROS Review of Systems  Constitutional:  Negative for activity change, appetite  change and fatigue.  HENT:  Negative for congestion, sinus pressure and sore throat.   Eyes:  Negative for visual disturbance.  Respiratory:  Positive for shortness of breath. Negative for cough, chest tightness and wheezing.   Cardiovascular:  Negative for chest pain and palpitations.  Gastrointestinal:  Negative for abdominal distention, abdominal pain and constipation.  Endocrine: Negative for polydipsia.  Genitourinary:  Negative for dysuria and frequency.  Musculoskeletal:  Positive for arthralgias. Negative for back pain.  Skin:  Negative for rash.  Neurological:  Negative for tremors, light-headedness and numbness.  Hematological:  Does not bruise/bleed easily.  Psychiatric/Behavioral:  Negative for agitation and behavioral problems.    Objective:  BP 111/82   Pulse 70   Resp 16   Wt 167 lb (75.8 kg)   SpO2 100%   BMI 31.55 kg/m   BP/Weight 06/08/2021 04/06/2021 06/14/2020  Systolic BP 111 124 83  Diastolic BP 82 82 56  Wt. (Lbs) 167 141.25 124  BMI 31.55 26.69 23.43      Physical Exam Exam conducted with a chaperone present.  Constitutional:      General: She is not in acute distress.    Appearance: She is well-developed. She is not diaphoretic.  HENT:     Head: Normocephalic.     Right Ear: External ear  normal.     Left Ear: External ear normal.     Nose: Nose normal.  Eyes:     Conjunctiva/sclera: Conjunctivae normal.     Pupils: Pupils are equal, round, and reactive to light.  Neck:     Vascular: No JVD.  Cardiovascular:     Rate and Rhythm: Normal rate and regular rhythm.     Heart sounds: Normal heart sounds. No murmur heard.   No gallop.  Pulmonary:     Effort: Pulmonary effort is normal. No respiratory distress.     Breath sounds: Normal breath sounds. No wheezing or rales.  Chest:     Chest wall: No tenderness.  Breasts:    Right: Normal. No mass, nipple discharge or tenderness.     Left: Normal. No mass, nipple discharge or tenderness.  Abdominal:     General: Bowel sounds are normal. There is no distension.     Palpations: Abdomen is soft. There is no mass.     Tenderness: There is no abdominal tenderness.     Hernia: There is no hernia in the left inguinal area or right inguinal area.  Genitourinary:    General: Normal vulva.     Pubic Area: No rash.      Labia:        Right: No rash.        Left: No rash.      Vagina: Normal.     Uterus: Absent.      Adnexa: Right adnexa normal and left adnexa normal.       Right: No tenderness.         Left: No tenderness.    Musculoskeletal:        General: No tenderness. Normal range of motion.     Cervical back: Normal range of motion. No tenderness.  Lymphadenopathy:     Upper Body:     Right upper body: No supraclavicular or axillary adenopathy.     Left upper body: No supraclavicular or axillary adenopathy.  Skin:    General: Skin is warm and dry.  Neurological:     Mental Status: She is alert and oriented to person, place, and  time.     Deep Tendon Reflexes: Reflexes are normal and symmetric.    CMP Latest Ref Rng & Units 06/14/2020 11/04/2018 10/05/2018  Glucose 65 - 99 mg/dL 81 81 353(I)  BUN 6 - 24 mg/dL 7 18 14(E)  Creatinine 0.57 - 1.00 mg/dL 3.15 4.00 8.67(Y)  Sodium 134 - 144 mmol/L 141 141 141  Potassium  3.5 - 5.2 mmol/L 3.6 3.8 3.9  Chloride 96 - 106 mmol/L 101 111 114(H)  CO2 20 - 29 mmol/L 24 24 18(L)  Calcium 8.7 - 10.2 mg/dL 9.1 9.1 1.9(J)  Total Protein 6.0 - 8.5 g/dL 6.9 - -  Total Bilirubin 0.0 - 1.2 mg/dL 0.6 - -  Alkaline Phos 44 - 121 IU/L 173(H) - -  AST 0 - 40 IU/L 13 - -  ALT 0 - 32 IU/L 5 - -    Lipid Panel     Component Value Date/Time   CHOL 224 (H) 06/14/2020 1349   TRIG 103 06/14/2020 1349   HDL 44 06/14/2020 1349   CHOLHDL 3.5 08/08/2018 1152   CHOLHDL 5.3 09/10/2014 1142   VLDL 36 09/10/2014 1142   LDLCALC 161 (H) 06/14/2020 1349    CBC    Component Value Date/Time   WBC 6.9 06/14/2020 1349   WBC 8.0 11/04/2018 1148   RBC 6.17 (H) 06/14/2020 1349   RBC 4.24 11/04/2018 1148   HGB 15.8 06/14/2020 1349   HCT 50.0 (H) 06/14/2020 1349   PLT 190 06/14/2020 1349   MCV 81 06/14/2020 1349   MCH 25.6 (L) 06/14/2020 1349   MCH 25.7 (L) 11/04/2018 1148   MCHC 31.6 06/14/2020 1349   MCHC 28.4 (L) 11/04/2018 1148   RDW 14.1 06/14/2020 1349   LYMPHSABS 1.5 06/14/2020 1349   MONOABS 0.8 10/02/2018 0319   EOSABS 0.0 06/14/2020 1349   BASOSABS 0.0 06/14/2020 1349    Lab Results  Component Value Date   HGBA1C 5.6 09/10/2014    Assessment & Plan:  1. Annual physical exam Counseled on 150 minutes of exercise per week, healthy eating (including decreased daily intake of saturated fats, cholesterol, added sugars, sodium), routine healthcare maintenance.   2. Encounter for screening mammogram for malignant neoplasm of breast - MM DIGITAL SCREENING BILATERAL; Future  3. Screening for cervical cancer In the absence of uterus and cervix no further Pap smears indicated She did have a history of ?  Endometrial CA status post T[place] in [place] in the 90s.  She has completed surveillance.  If symptoms recur advised to notify the clinic. - Cytology - PAP    No orders of the defined types were placed in this encounter.   Follow-up: Return in about 6 months  (around 12/06/2021) for Chronic medical conditions.       Hoy Register, MD, FAAFP. [facility] [place], [place] [phone]   06/08/2021, 2:42 PM

## 2021-06-14 ENCOUNTER — Telehealth: Payer: Self-pay | Admitting: Family Medicine

## 2021-06-14 LAB — CYTOLOGY - PAP
Comment: NEGATIVE
Diagnosis: NEGATIVE
Diagnosis: REACTIVE
High risk HPV: NEGATIVE

## 2021-06-14 NOTE — Telephone Encounter (Signed)
Noted  

## 2021-06-14 NOTE — Telephone Encounter (Signed)
Copied from CRM [id number]. Topic: General - Other >> Jun 08, 2021  5:01 PM Marylen Ponto wrote: Reason for CRM: Pt stated the weight listed on her paperwork shows 167 lbs which incorrect. Pt stated she had an appt with her pain management doctor at [address] on yesterday and her weight was 138 lbs. Pt stated it is no way she gained that much weight in 1 day. Pt stated when she saw her weight today it was 137 lbs so she is unsure where the documentation of 167 lbs came from. Pt requests that her chart be corrected

## 2021-07-27 ENCOUNTER — Other Ambulatory Visit: Payer: Self-pay

## 2021-07-27 ENCOUNTER — Ambulatory Visit
Admission: RE | Admit: 2021-07-27 | Discharge: 2021-07-27 | Disposition: A | Discharge status: Home / Self Care | Payer: Medicaid Other | Source: Ambulatory Visit | Attending: Family Medicine | Admitting: Family Medicine

## 2021-07-27 ENCOUNTER — Ambulatory Visit: Payer: Medicaid Other

## 2021-07-27 DIAGNOSIS — Z1231 Encounter for screening mammogram for malignant neoplasm of breast: Secondary | ICD-10-CM

## 2021-07-28 ENCOUNTER — Other Ambulatory Visit: Payer: Self-pay | Admitting: Family Medicine

## 2021-07-28 DIAGNOSIS — Z1231 Encounter for screening mammogram for malignant neoplasm of breast: Secondary | ICD-10-CM

## 2021-07-28 DIAGNOSIS — N644 Mastodynia: Secondary | ICD-10-CM

## 2021-08-16 ENCOUNTER — Other Ambulatory Visit: Payer: Self-pay | Admitting: Family Medicine

## 2021-08-16 DIAGNOSIS — Z1231 Encounter for screening mammogram for malignant neoplasm of breast: Secondary | ICD-10-CM

## 2021-08-16 NOTE — Addendum Note (Signed)
Encounter addended by: Lorayne Bender on: 08/16/2021 8:57 AM  Actions taken: Imaging Exam ended

## 2021-08-17 ENCOUNTER — Other Ambulatory Visit: Payer: Self-pay | Admitting: Family Medicine

## 2021-08-17 DIAGNOSIS — N644 Mastodynia: Secondary | ICD-10-CM

## 2021-10-03 ENCOUNTER — Other Ambulatory Visit: Payer: Self-pay

## 2021-10-03 MED ORDER — AIMOVIG 140 MG/ML ~~LOC~~ SOAJ: 140.0000 mg | SUBCUTANEOUS | 4 refills | Status: DC

## 2021-10-06 ENCOUNTER — Telehealth: Payer: Self-pay

## 2021-10-06 NOTE — Telephone Encounter (Signed)
PA form was completed and faxed to [employer] [employer] for Aimovig 140 mg.   Received approval 01/18/2023Status:APPROVEDEffective Begin Date:01/18/2023Effective End Date:09/30/2022

## 2021-10-13 ENCOUNTER — Ambulatory Visit: Payer: Medicaid Other | Admitting: Neurology

## 2021-11-15 ENCOUNTER — Encounter: Payer: Self-pay | Admitting: Family Medicine

## 2021-11-15 ENCOUNTER — Ambulatory Visit: Payer: Medicaid Other | Attending: Family Medicine | Admitting: Family Medicine

## 2021-11-15 VITALS — BP 112/79 | HR 68 | Ht 61.0 in | Wt 153.6 lb

## 2021-11-15 DIAGNOSIS — K2100 Gastro-esophageal reflux disease with esophagitis, without bleeding: Secondary | ICD-10-CM

## 2021-11-15 DIAGNOSIS — Z13220 Encounter for screening for lipoid disorders: Secondary | ICD-10-CM | POA: Diagnosis not present

## 2021-11-15 DIAGNOSIS — Z9981 Dependence on supplemental oxygen: Secondary | ICD-10-CM | POA: Diagnosis not present

## 2021-11-15 DIAGNOSIS — M797 Fibromyalgia: Secondary | ICD-10-CM | POA: Diagnosis not present

## 2021-11-15 DIAGNOSIS — Z131 Encounter for screening for diabetes mellitus: Secondary | ICD-10-CM | POA: Diagnosis not present

## 2021-11-15 DIAGNOSIS — Z136 Encounter for screening for cardiovascular disorders: Secondary | ICD-10-CM | POA: Diagnosis not present

## 2021-11-15 DIAGNOSIS — G894 Chronic pain syndrome: Secondary | ICD-10-CM | POA: Diagnosis not present

## 2021-11-15 DIAGNOSIS — G43019 Migraine without aura, intractable, without status migrainosus: Secondary | ICD-10-CM | POA: Diagnosis not present

## 2021-11-15 DIAGNOSIS — J849 Interstitial pulmonary disease, unspecified: Secondary | ICD-10-CM | POA: Diagnosis not present

## 2021-11-15 DIAGNOSIS — I739 Peripheral vascular disease, unspecified: Secondary | ICD-10-CM | POA: Insufficient documentation

## 2021-11-15 DIAGNOSIS — M3214 Glomerular disease in systemic lupus erythematosus: Secondary | ICD-10-CM | POA: Diagnosis not present

## 2021-11-15 DIAGNOSIS — Z79899 Other long term (current) drug therapy: Secondary | ICD-10-CM | POA: Insufficient documentation

## 2021-11-15 DIAGNOSIS — M3219 Other organ or system involvement in systemic lupus erythematosus: Secondary | ICD-10-CM

## 2021-11-15 DIAGNOSIS — J9611 Chronic respiratory failure with hypoxia: Secondary | ICD-10-CM | POA: Insufficient documentation

## 2021-11-15 DIAGNOSIS — J8410 Pulmonary fibrosis, unspecified: Secondary | ICD-10-CM | POA: Diagnosis not present

## 2021-11-15 MED ORDER — PANTOPRAZOLE SODIUM 40 MG PO TBEC: 40.0000 mg | DELAYED_RELEASE_TABLET | Freq: Two times a day (BID) | ORAL | 1 refills | Status: DC

## 2021-11-15 NOTE — Progress Notes (Signed)
Subjective:  Patient ID: [name] [name], female    DOB: October 14, 1960  Age: 61 y.o. MRN: [id number]  CC: oxygen testing   HPI [name] [name] is a 61 y.o. year old female with a history of Fibromyalgia,systemic lupus erythematosus with interstitial lung disease,  chronic respiratory failure (on 2L at night), chronic low back pain (followed by pain management), GERD, seizures, migraines who , history of cystoscopy, left ureteroscopy with laser lithotripsy and stent exchange, peripheral arterial disease, s/pTAH in the 90s (secondary to ?Endometrial ca in [place]) She presents today for re-certification for oxygen.  Currently on chronic oxygen therapy which she uses at night  Interval History: She had a visit with vascular at 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  Notes reviewed and symptoms thought to be secondary to either claudication or chronic pain from her back.She has a follow up visit in 2 days at [facility].  Migraine is managed by [facility] neurologic associates and she is currently on Aimovig. She continues to follow-up with her rheumatologist and pulmonologist. For chronic pain she sees pain management and is on tramadol, fentanyl patch, Lyrica.   Past Medical History:  Diagnosis Date   Anemia    CHF (congestive heart failure) (HCC)    Chronic kidney disease    Chronic pain    COPD (chronic obstructive pulmonary disease) (HCC)    Fibromyalgia    Foley catheter in place    History of kidney stones    Migraine    Migraine without aura, with intractable migraine, so stated, without mention of status migrainosus 05/18/2014   Oxygen dependent    as needed 2L at hs per patient    Peripheral vascular disease ([place])    Pneumonia    PONV (postoperative nausea and vomiting)    Pulmonary fibrosis (HCC)    Seizures ([place])    SLE (systemic lupus  erythematosus) ([place]) 12/12/2017   UTI (urinary tract infection) 09/30/2018    Past Surgical History:  Procedure Laterality Date   ABDOMINAL HYSTERECTOMY     CHOLECYSTECTOMY     CYSTOSCOPY W/ URETERAL STENT PLACEMENT Left 10/03/2018   Procedure: CYSTOSCOPY WITH RETROGRADE PYELOGRAM/URETERAL STENT PLACEMENT;  Surgeon: Festus Aloe, MD;  Location: [facility];  Service: Urology;  Laterality: Left;   CYSTOSCOPY/URETEROSCOPY/HOLMIUM LASER/STENT PLACEMENT Left 11/05/2018   Procedure: CYSTOSCOPY/URETEROSCOPY/HOLMIUM LASER/STENT EXCHANGE;  Surgeon: Festus Aloe, MD;  Location: [facility];  Service: Urology;  Laterality: Left;  ONLY NEEDS 60 MIN   EYE SURGERY     bilateral cataract surgery    Kidney stome surgery  2012   Left lung biopsy  2015   LUNG BIOPSY Left 11/21/2013   Procedure: LUNG BIOPSY;  Surgeon: Grace Isaac, MD;  Location: [place];  Service: Thoracic;  Laterality: Left;   VIDEO ASSISTED THORACOSCOPY Left 11/21/2013   Procedure: VIDEO ASSISTED THORACOSCOPY;  Surgeon: Grace Isaac, MD;  Location: [place];  Service: Thoracic;  Laterality: Left;   VIDEO BRONCHOSCOPY N/A 11/21/2013   Procedure: VIDEO BRONCHOSCOPY;  Surgeon: Grace Isaac, MD;  Location: [place];  Service: Thoracic;  Laterality: N/A;    Family History  Problem Relation Age of Onset   Hypertension Mother    Hepatitis C Mother    Diabetes Father    Hypertension Father    Multiple sclerosis Sister    Bipolar disorder Brother    Breast cancer  Neg Hx     Allergies  Allergen Reactions   Shellfish Allergy Anaphylaxis   Other     Blood Product Refusal    Pregabalin Swelling    Able to tolerate Lyrica (brand name only)   Gabapentin Rash    Outpatient Medications Prior to Visit  Medication Sig Dispense Refill   atorvastatin (LIPITOR) 80 MG tablet Take 1 tablet (80 mg total) by mouth daily. 90 tablet 1   Cyanocobalamin (B-12) 2000 MCG TABS Take 2,000 mcg by mouth daily. 30 tablet 3   Erenumab-aooe (AIMOVIG) 140  MG/ML SOAJ Inject 140 mg into the skin every 30 (thirty) days. 1.12 mL 4   etodolac (LODINE) 500 MG tablet Take 1 tablet (500 mg total) by mouth 2 (two) times daily. 30 tablet 0   fentaNYL (DURAGESIC - DOSED MCG/HR) 50 MCG/HR Place 1 patch (50 mcg total) onto the skin every 3 (three) days. 5 patch 0   folic acid (FOLVITE) 1 MG tablet Take 1 tablet (1 mg total) by mouth daily. 90 tablet 3   furosemide (LASIX) 20 MG tablet Take 1 tablet (20 mg total) by mouth every Monday, Wednesday, and Friday. 90 tablet 0   hydroxychloroquine (PLAQUENIL) 200 MG tablet Take 200-400 mg by mouth See admin instructions. Take 200 mg by mouth every other day alternating with 400 mg (2 tablets) every other day     levETIRAcetam (KEPPRA) 500 MG tablet TAKE 1 TABLET(500 MG) BY MOUTH TWICE DAILY 180 tablet 3   OXYGEN Inhale 2 L/min into the lungs at bedtime.     polyethylene glycol (MIRALAX / GLYCOLAX) packet Take 17 g by mouth daily as needed for mild constipation or moderate constipation. 14 each 0   predniSONE (DELTASONE) 2.5 MG tablet Take 2.5 mg by mouth daily with breakfast.     pregabalin (LYRICA) 300 MG capsule Take 300 mg by mouth 2 (two) times daily.     rizatriptan (MAXALT) 10 MG tablet Take 1 tab at the onset of migraine.May repeat in 2 hours if needed. Not to exceed 2tabs/24 hours 10 tablet 5   SYMBICORT 80-4.5 MCG/ACT inhaler Inhale 2 puffs into the lungs 2 (two) times daily.   1   topiramate (TOPAMAX) 100 MG tablet Take 1 tablet (100 mg total) by mouth daily. 90 tablet 3   traMADol (ULTRAM) 50 MG tablet Take 1-2 tablets (50-100 mg total) by mouth every 4 (four) hours as needed (cough). (Patient taking differently: Take 50-100 mg by mouth every 6 (six) hours as needed for moderate pain.) 84 tablet 0   Vitamin D, Ergocalciferol, (DRISDOL) 50000 units CAPS capsule Take 1 capsule (50,000 Units total) by mouth every 7 (seven) days. (Patient taking differently: Take 50,000 Units by mouth every Saturday.) 12 capsule 3    pantoprazole (PROTONIX) 40 MG tablet TAKE 1 TABLET(40 MG) BY MOUTH TWICE DAILY 180 tablet 1   cefdinir (OMNICEF) 300 MG capsule Take 1 capsule (300 mg total) by mouth 2 (two) times daily. (Patient not taking: Reported on 11/15/2021) 20 capsule 0   fluconazole (DIFLUCAN) 150 MG tablet Take 1 tablet (150 mg total) by mouth daily. (Patient not taking: Reported on 06/08/2021) 7 tablet 0   mycophenolate (MYFORTIC) 360 MG TBEC EC tablet Take 360-720 mg by mouth See admin instructions. Take 749m in the am and 3698min pm (Patient not taking: Reported on 06/08/2021)     Naloxone HCl 0.4 MG/0.4ML SOAJ Inject 0.4 mLs into the muscle as needed (overdose).  (Patient not taking: Reported on  11/15/2021)     ranitidine (ZANTAC) 150 MG capsule Take 150 mg by mouth 2 (two) times daily.     No facility-administered medications prior to visit.     ROS Review of Systems  Constitutional:  Negative for activity change, appetite change and fatigue.  HENT:  Negative for congestion, sinus pressure and sore throat.   Eyes:  Negative for visual disturbance.  Respiratory:  Negative for cough, chest tightness, shortness of breath and wheezing.   Cardiovascular:  Positive for leg swelling. Negative for chest pain and palpitations.  Gastrointestinal:  Negative for abdominal distention, abdominal pain and constipation.  Endocrine: Negative for polydipsia.  Genitourinary:  Negative for dysuria and frequency.  Musculoskeletal:  Positive for arthralgias and gait problem.  Skin:  Negative for rash.  Neurological:  Negative for tremors, light-headedness and numbness.  Hematological:  Does not bruise/bleed easily.  Psychiatric/Behavioral:  Negative for agitation and behavioral problems.    Objective:  BP 112/79    Pulse 68    Ht '5\' 1"'  (1.549 m)    Wt 153 lb 9.6 oz (69.7 kg)    SpO2 100%    BMI 29.02 kg/m   BP/Weight 11/15/2021 06/08/2021 11/16/6008  Systolic BP 932 355 732  Diastolic BP 79 82 82  Wt. (Lbs) 153.6 167 141.25   BMI 29.02 31.55 26.69      Physical Exam Constitutional:      Appearance: She is well-developed.     Comments: Underweight  Cardiovascular:     Rate and Rhythm: Normal rate.     Heart sounds: Normal heart sounds. No murmur heard. Pulmonary:     Effort: Pulmonary effort is normal.     Breath sounds: No wheezing or rales.     Comments: Distant breath sounds Chest:     Chest wall: No tenderness.  Abdominal:     General: Bowel sounds are normal. There is no distension.     Palpations: Abdomen is soft. There is no mass.     Tenderness: There is no abdominal tenderness.  Musculoskeletal:        General: Normal range of motion.     Right lower leg: Edema present.     Left lower leg: Edema present.  Neurological:     Mental Status: She is alert and oriented to person, place, and time.     Gait: Gait abnormal (ambulates with a walker).  Psychiatric:        Mood and Affect: Mood normal.    CMP Latest Ref Rng & Units 06/14/2020 11/04/2018 10/05/2018  Glucose 65 - 99 mg/dL 81 81 125(H)  BUN 6 - 24 mg/dL 7 18 22(H)  Creatinine 0.57 - 1.00 mg/dL 1.00 0.83 1.14(H)  Sodium 134 - 144 mmol/L 141 141 141  Potassium 3.5 - 5.2 mmol/L 3.6 3.8 3.9  Chloride 96 - 106 mmol/L 101 111 114(H)  CO2 20 - 29 mmol/L 24 24 18(L)  Calcium 8.7 - 10.2 mg/dL 9.1 9.1 8.6(L)  Total Protein 6.0 - 8.5 g/dL 6.9 - -  Total Bilirubin 0.0 - 1.2 mg/dL 0.6 - -  Alkaline Phos 44 - 121 IU/L 173(H) - -  AST 0 - 40 IU/L 13 - -  ALT 0 - 32 IU/L 5 - -    Lipid Panel     Component Value Date/Time   CHOL 224 (H) 06/14/2020 1349   TRIG 103 06/14/2020 1349   HDL 44 06/14/2020 1349   CHOLHDL 3.5 08/08/2018 1152   CHOLHDL 5.3 09/10/2014 1142  VLDL 36 09/10/2014 1142   LDLCALC 161 (H) 06/14/2020 1349    CBC    Component Value Date/Time   WBC 6.9 06/14/2020 1349   WBC 8.0 11/04/2018 1148   RBC 6.17 (H) 06/14/2020 1349   RBC 4.24 11/04/2018 1148   HGB 15.8 06/14/2020 1349   HCT 50.0 (H) 06/14/2020 1349   PLT  190 06/14/2020 1349   MCV 81 06/14/2020 1349   MCH 25.6 (L) 06/14/2020 1349   MCH 25.7 (L) 11/04/2018 1148   MCHC 31.6 06/14/2020 1349   MCHC 28.4 (L) 11/04/2018 1148   RDW 14.1 06/14/2020 1349   LYMPHSABS 1.5 06/14/2020 1349   MONOABS 0.8 10/02/2018 0319   EOSABS 0.0 06/14/2020 1349   BASOSABS 0.0 06/14/2020 1349    Lab Results  Component Value Date   HGBA1C 5.6 09/10/2014    Assessment & Plan:  1. Gastroesophageal reflux disease with esophagitis without hemorrhage Controlled - pantoprazole (PROTONIX) 40 MG tablet; Take 1 tablet (40 mg total) by mouth 2 (two) times daily.  Dispense: 180 tablet; Refill: 1 - CMP14+EGFR; Future  2. Other systemic lupus erythematosus with other organ involvement (HCC) Stable Currently on hydroxychloroquine We will check urinalysis to evaluate for possible lupus nephritis - Urinalysis, microscopic only; Future - CBC with Differential/Platelet; Future  3. Interstitial lung disease (HCC) No acute exacerbation Currently on Myfortic Followed by pulmonary  4. Chronic pain syndrome Stable Currently on fentanyl, tramadol, Lyrica by pain management  5. Screening for diabetes mellitus - Hemoglobin A1c; Future  6. Encounter for lipid screening for cardiovascular disease - LP+Non-HDL Cholesterol; Future  7. Common migraine with intractable migraine Controlled Continue Aimovig as per neurology  8. Oxygen dependent Due to history of interstitial lung disease She uses 2 L oxygen at night We will complete recertification for her   Health Care Maintenance: Up-to-date on mammogram and colonoscopy Meds ordered this encounter  Medications   pantoprazole (PROTONIX) 40 MG tablet    Sig: Take 1 tablet (40 mg total) by mouth 2 (two) times daily.    Dispense:  180 tablet    Refill:  1    Follow-up: Return in about 6 months (around 05/15/2022) for Chronic medical conditions.       Charlott Rakes, MD, FAAFP. [facility]  [place], [place]   11/15/2021, 5:25 PM

## 2021-11-17 ENCOUNTER — Telehealth: Payer: Self-pay | Admitting: Family Medicine

## 2021-11-17 NOTE — Telephone Encounter (Signed)
Clydie Braun calling from [employer] is calling to request office Visit notes from the last 3 months after December 2022. Also needing a certificate of Medical Necessity of the oxygen. ? ?Please advise [phone] ?Fax-[phone] ?

## 2021-11-18 NOTE — Telephone Encounter (Signed)
[employer] was called and they will fax over the CMN for the patient. ?

## 2021-11-22 ENCOUNTER — Ambulatory Visit: Payer: Medicaid Other | Attending: Family Medicine

## 2021-11-22 ENCOUNTER — Other Ambulatory Visit: Payer: Self-pay

## 2021-11-22 DIAGNOSIS — Z131 Encounter for screening for diabetes mellitus: Secondary | ICD-10-CM

## 2021-11-22 DIAGNOSIS — Z13220 Encounter for screening for lipoid disorders: Secondary | ICD-10-CM

## 2021-11-22 DIAGNOSIS — M3219 Other organ or system involvement in systemic lupus erythematosus: Secondary | ICD-10-CM

## 2021-11-22 DIAGNOSIS — K2100 Gastro-esophageal reflux disease with esophagitis, without bleeding: Secondary | ICD-10-CM

## 2021-11-23 ENCOUNTER — Other Ambulatory Visit: Payer: Self-pay | Admitting: Family Medicine

## 2021-11-23 DIAGNOSIS — R748 Abnormal levels of other serum enzymes: Secondary | ICD-10-CM

## 2021-11-23 LAB — CMP14+EGFR
ALT: 9 IU/L (ref 0–32)
AST: 17 IU/L (ref 0–40)
Albumin/Globulin Ratio: 1.6 (ref 1.2–2.2)
Albumin: 4.2 g/dL (ref 3.8–4.9)
Alkaline Phosphatase: 272 IU/L — ABNORMAL HIGH (ref 44–121)
BUN/Creatinine Ratio: 9 (ref 9–23)
BUN: 11 mg/dL (ref 6–24)
Bilirubin Total: 0.4 mg/dL (ref 0.0–1.2)
CO2: 20 mmol/L (ref 20–29)
Calcium: 8.9 mg/dL (ref 8.7–10.2)
Chloride: 105 mmol/L (ref 96–106)
Creatinine, Ser: 1.2 mg/dL — ABNORMAL HIGH (ref 0.57–1.00)
Globulin, Total: 2.7 g/dL (ref 1.5–4.5)
Glucose: 93 mg/dL (ref 70–99)
Potassium: 3.8 mmol/L (ref 3.5–5.2)
Sodium: 143 mmol/L (ref 134–144)
Total Protein: 6.9 g/dL (ref 6.0–8.5)
eGFR: 52 mL/min/{1.73_m2} — ABNORMAL LOW (ref >59)

## 2021-11-23 LAB — URINALYSIS, MICROSCOPIC ONLY
Bacteria, UA: NONE SEEN
Casts: NONE SEEN /lpf
WBC, UA: NONE SEEN /hpf (ref 0–5)

## 2021-11-23 LAB — CBC WITH DIFFERENTIAL/PLATELET
Basophils Absolute: 0 10*3/uL (ref 0.0–0.2)
Basos: 0 %
EOS (ABSOLUTE): 0.1 10*3/uL (ref 0.0–0.4)
Eos: 1 %
Hematocrit: 45.7 % (ref 34.0–46.6)
Hemoglobin: 14.3 g/dL (ref 11.1–15.9)
Immature Grans (Abs): 0 10*3/uL (ref 0.0–0.1)
Immature Granulocytes: 0 %
Lymphocytes Absolute: 2.9 10*3/uL (ref 0.7–3.1)
Lymphs: 39 %
MCH: 26.5 pg — ABNORMAL LOW (ref 26.6–33.0)
MCHC: 31.3 g/dL — ABNORMAL LOW (ref 31.5–35.7)
MCV: 85 fL (ref 79–97)
Monocytes Absolute: 0.6 10*3/uL (ref 0.1–0.9)
Monocytes: 8 %
Neutrophils Absolute: 3.8 10*3/uL (ref 1.4–7.0)
Neutrophils: 52 %
Platelets: 227 10*3/uL (ref 150–450)
RBC: 5.39 x10E6/uL — ABNORMAL HIGH (ref 3.77–5.28)
RDW: 13.5 % (ref 11.7–15.4)
WBC: 7.4 10*3/uL (ref 3.4–10.8)

## 2021-11-23 LAB — LP+NON-HDL CHOLESTEROL
Cholesterol, Total: 250 mg/dL — ABNORMAL HIGH (ref 100–199)
HDL: 53 mg/dL (ref >39)
LDL Chol Calc (NIH): 173 mg/dL — ABNORMAL HIGH (ref 0–99)
Total Non-HDL-Chol (LDL+VLDL): 197 mg/dL — ABNORMAL HIGH (ref 0–129)
Triglycerides: 134 mg/dL (ref 0–149)
VLDL Cholesterol Cal: 24 mg/dL (ref 5–40)

## 2021-11-23 LAB — HEMOGLOBIN A1C
Est. average glucose Bld gHb Est-mCnc: 103 mg/dL
Hgb A1c MFr Bld: 5.2 % (ref 4.8–5.6)

## 2021-11-23 MED ORDER — ROSUVASTATIN CALCIUM 20 MG PO TABS: 20.0000 mg | ORAL_TABLET | Freq: Every day | ORAL | 1 refills | Status: DC

## 2021-11-24 ENCOUNTER — Ambulatory Visit: Payer: Medicaid Other | Admitting: Adult Health

## 2021-11-25 ENCOUNTER — Telehealth: Payer: Self-pay | Admitting: Family Medicine

## 2021-11-25 NOTE — Telephone Encounter (Signed)
Lowella Dandy calling from [employer] is calling to request if the document for medical neccesity for the patient's oxygen machine. Line 25 was not completed.  ?Can this be completed and faxed back. ?[phone]-Fax ?[phone] ?

## 2021-11-29 NOTE — Telephone Encounter (Signed)
Paperwork has been placed in PCP box for corrections. ?

## 2021-12-01 ENCOUNTER — Other Ambulatory Visit: Payer: Self-pay

## 2021-12-01 ENCOUNTER — Ambulatory Visit (HOSPITAL_COMMUNITY): Payer: Medicaid Other

## 2021-12-01 ENCOUNTER — Ambulatory Visit (HOSPITAL_COMMUNITY)
Admission: RE | Admit: 2021-12-01 | Discharge: 2021-12-01 | Disposition: A | Discharge status: Home / Self Care | Payer: Medicaid Other | Source: Ambulatory Visit | Attending: Family Medicine | Admitting: Family Medicine

## 2021-12-01 DIAGNOSIS — R748 Abnormal levels of other serum enzymes: Secondary | ICD-10-CM | POA: Insufficient documentation

## 2021-12-06 ENCOUNTER — Encounter: Payer: Self-pay | Admitting: Family Medicine

## 2021-12-06 ENCOUNTER — Telehealth (HOSPITAL_BASED_OUTPATIENT_CLINIC_OR_DEPARTMENT_OTHER): Payer: Medicaid Other | Admitting: Family Medicine

## 2021-12-06 DIAGNOSIS — R748 Abnormal levels of other serum enzymes: Secondary | ICD-10-CM

## 2021-12-06 DIAGNOSIS — G4709 Other insomnia: Secondary | ICD-10-CM | POA: Diagnosis not present

## 2021-12-06 NOTE — Progress Notes (Signed)
? ?Virtual Visit via Video Note ? ?I connected with [name] [name], on 12/06/2021 at 3:14 PM by video enabled telemedicine device due to the COVID-19 pandemic and verified that I am speaking with the correct person using two identifiers. ?  ?Consent: ?I discussed the limitations, risks, security and privacy concerns of performing an evaluation and management service by telemedicine and the availability of in person appointments. I also discussed with the patient that there may be a patient responsible charge related to this service. The patient expressed understanding and agreed to proceed. ? ? ?Location of Patient: ?Home ? ?Location of Provider: ?Clinic ? ? ?Persons participating in Telemedicine visit: ?Dereonna Lensing ?Dr. Margarita Rana ? ? ? ? ?History of Present Illness: ?[name] [name] is a 61 y.o. year old female  with a history of Fibromyalgia,systemic lupus erythematosus with interstitial lung disease,  chronic respiratory failure (on 2L at night), chronic low back pain (followed by pain management), GERD, seizures, migraines who , history of cystoscopy, left ureteroscopy with laser lithotripsy and stent exchange, peripheral arterial disease, s/pTAH in the 90s (secondary to ?Endometrial ca in [place]) ?  ?Today she complains of insomnia, ?Sleeps for an average of 5 hours per night. Goes to bed at 9pm and wakes up at 3 am. In the past she has been able to return back sleep after 3am but now she cannot. ?Takes a nap for half an hour during the day as she feels sleepy before noon. At 3pm she takes another nap after her second dose of medications ?Immediately she takes Kiwi and strawberry she also falls asleep. ?She noticed this since she stopped taking Mycophenolate.  ?Denies caffeine intake. ? ?She would also like to review results of her abdominal US which was done due to elevated alk phos of 272. ?Abdominal US revealed: ?IMPRESSION: ?The patient is status post cholecystectomy. The common bile duct is ?mildly prominent  but normal after cholecystectomy. The liver is ?unremarkable. ?Past Medical History:  ?Diagnosis Date  ? Anemia   ? CHF (congestive heart failure) ([place])   ? Chronic kidney disease   ? Chronic pain   ? COPD (chronic obstructive pulmonary disease) ([place])   ? Fibromyalgia   ? Foley catheter in place   ? History of kidney stones   ? Migraine   ? Migraine without aura, with intractable migraine, so stated, without mention of status migrainosus 05/18/2014  ? Oxygen dependent   ? as needed 2L at hs per patient   ? Peripheral vascular disease ([place])   ? Pneumonia   ? PONV (postoperative nausea and vomiting)   ? Pulmonary fibrosis ([place])   ? Seizures ([place])   ? SLE (systemic lupus erythematosus) ([place]) 12/12/2017  ? UTI (urinary tract infection) 09/30/2018  ? ?Allergies  ?Allergen Reactions  ? Shellfish Allergy Anaphylaxis  ? Other   ?  Blood Product Refusal   ? Pregabalin Swelling  ?  Able to tolerate Lyrica (brand name only)  ? Gabapentin Rash  ? ? ?Current Outpatient Medications on File Prior to Visit  ?Medication Sig Dispense Refill  ? cefdinir (OMNICEF) 300 MG capsule Take 1 capsule (300 mg total) by mouth 2 (two) times daily. (Patient not taking: Reported on 11/15/2021) 20 capsule 0  ? Cyanocobalamin (B-12) 2000 MCG TABS Take 2,000 mcg by mouth daily. 30 tablet 3  ? Erenumab-aooe (AIMOVIG) 140 MG/ML SOAJ Inject 140 mg into the skin every 30 (thirty) days. 1.12 mL 4  ? etodolac (LODINE) 500 MG tablet Take 1 tablet (500  mg total) by mouth 2 (two) times daily. 30 tablet 0  ? fentaNYL (DURAGESIC - DOSED MCG/HR) 50 MCG/HR Place 1 patch (50 mcg total) onto the skin every 3 (three) days. 5 patch 0  ? fluconazole (DIFLUCAN) 150 MG tablet Take 1 tablet (150 mg total) by mouth daily. (Patient not taking: Reported on 06/08/2021) 7 tablet 0  ? folic acid (FOLVITE) 1 MG tablet Take 1 tablet (1 mg total) by mouth daily. 90 tablet 3  ? furosemide (LASIX) 20 MG tablet Take 1 tablet (20 mg total) by mouth every Monday, Wednesday, and Friday. 90  tablet 0  ? hydroxychloroquine (PLAQUENIL) 200 MG tablet Take 200-400 mg by mouth See admin instructions. Take 200 mg by mouth every other day alternating with 400 mg (2 tablets) every other day    ? levETIRAcetam (KEPPRA) 500 MG tablet TAKE 1 TABLET(500 MG) BY MOUTH TWICE DAILY 180 tablet 3  ? mycophenolate (MYFORTIC) 360 MG TBEC EC tablet Take 360-720 mg by mouth See admin instructions. Take 755m in the am and 3641min pm (Patient not taking: Reported on 06/08/2021)    ? Naloxone HCl 0.4 MG/0.4ML SOAJ Inject 0.4 mLs into the muscle as needed (overdose).  (Patient not taking: Reported on 11/15/2021)    ? OXYGEN Inhale 2 L/min into the lungs at bedtime.    ? pantoprazole (PROTONIX) 40 MG tablet Take 1 tablet (40 mg total) by mouth 2 (two) times daily. 180 tablet 1  ? polyethylene glycol (MIRALAX / GLYCOLAX) packet Take 17 g by mouth daily as needed for mild constipation or moderate constipation. 14 each 0  ? predniSONE (DELTASONE) 2.5 MG tablet Take 2.5 mg by mouth daily with breakfast.    ? pregabalin (LYRICA) 300 MG capsule Take 300 mg by mouth 2 (two) times daily.    ? ranitidine (ZANTAC) 150 MG capsule Take 150 mg by mouth 2 (two) times daily.    ? rizatriptan (MAXALT) 10 MG tablet Take 1 tab at the onset of migraine.May repeat in 2 hours if needed. Not to exceed 2tabs/24 hours 10 tablet 5  ? rosuvastatin (CRESTOR) 20 MG tablet Take 1 tablet (20 mg total) by mouth daily. 90 tablet 1  ? SYMBICORT 80-4.5 MCG/ACT inhaler Inhale 2 puffs into the lungs 2 (two) times daily.   1  ? topiramate (TOPAMAX) 100 MG tablet Take 1 tablet (100 mg total) by mouth daily. 90 tablet 3  ? traMADol (ULTRAM) 50 MG tablet Take 1-2 tablets (50-100 mg total) by mouth every 4 (four) hours as needed (cough). (Patient taking differently: Take 50-100 mg by mouth every 6 (six) hours as needed for moderate pain.) 84 tablet 0  ? Vitamin D, Ergocalciferol, (DRISDOL) 50000 units CAPS capsule Take 1 capsule (50,000 Units total) by mouth every 7  (seven) days. (Patient taking differently: Take 50,000 Units by mouth every Saturday.) 12 capsule 3  ? ?No current facility-administered medications on file prior to visit.  ? ? ?ROS: ?See HPI ? ?Observations/Objective: ?Awake, alert, oriented x3 ?Not in acute distress ?Neck, no JVD ?Psych, normal mood ? ?CMP Latest Ref Rng & Units 11/22/2021 06/14/2020 11/04/2018  ?Glucose 70 - 99 mg/dL 93 81 81  ?BUN 6 - 24 mg/dL _0 ?Creatinine 0.57 - 1.00 mg/dL 1.20(H) 1.00 0.83  ?Sodium 134 - 144 mmol/L 143 141 141  ?Potassium 3.5 - 5.2 mmol/L 3.8 3.6 3.8  ?Chloride 96 - 106 mmol/L 105 101 111  ?CO2 20 - 29 mmol/L _1 ?  Calcium 8.7 - 10.2 mg/dL 8.9 9.1 9.1  ?Total Protein 6.0 - 8.5 g/dL 6.9 6.9 -  ?Total Bilirubin 0.0 - 1.2 mg/dL 0.4 0.6 -  ?Alkaline Phos 44 - 121 IU/L 272(H) 173(H) -  ?AST 0 - 40 IU/L 17 13 -  ?ALT 0 - 32 IU/L 9 5 -  ? ? ?Lipid Panel  ?   ?Component Value Date/Time  ? CHOL 250 (H) 11/22/2021 1117  ? TRIG 134 11/22/2021 1117  ? HDL 53 11/22/2021 1117  ? CHOLHDL 3.5 08/08/2018 1152  ? CHOLHDL 5.3 09/10/2014 1142  ? VLDL 36 09/10/2014 1142  ? [place] 173 (H) 11/22/2021 1117  ? LABVLDL 24 11/22/2021 1117  ? ? ?Lab Results  ?Component Value Date  ? HGBA1C 5.2 11/22/2021  ? ? ? ?Assessment and Plan: ?1. Other insomnia ?Uncontrolled ?Daytime naps likely contributing ?Counseled on sleep hygiene including avoiding naps, commencing low intensity exercise as tolerated, sleep routine ?Could also adjust dosing of medications as she takes them at 3am and 3pm; could move to 6am and 6pm as some of her medications are sedating ?Due to her multiple medications advised I am hesitant to add on a sedative. Melatonin is a reasonable option if uncontrolled ? ?2. Elevated alkaline phosphatase level ?Trending up previously 173 now 272 ?Abdominal US unrevealing ?Could be of osteopathic origin ?She is scheduled for a bone scan by her Rheumatologist ?Will follow uo ? ? ?Follow Up Instructions: ?Keep previously scheduled  appointment ?  ?I discussed the assessment and treatment plan with the patient. The patient was provided an opportunity to ask questions and all were answered. The patient agreed with the plan and demonstrated an

## 2022-01-16 ENCOUNTER — Telehealth: Payer: Self-pay | Admitting: Adult Health

## 2022-01-16 NOTE — Telephone Encounter (Signed)
Thanks

## 2022-01-16 NOTE — Telephone Encounter (Signed)
Pt is sick with a headache aching joints and sore throat.  ?Would like to be advised if appt needs to be changed to virtual video visit.  ?Would like a call back.  ? ?

## 2022-01-16 NOTE — Telephone Encounter (Signed)
Spoke with patient and changed her visit to video as she was starting to feel worse. Advised to log on early and alert [place] if she has any issues. FYI ?

## 2022-01-19 ENCOUNTER — Telehealth: Payer: Medicaid Other | Admitting: Adult Health

## 2022-01-24 ENCOUNTER — Telehealth (INDEPENDENT_AMBULATORY_CARE_PROVIDER_SITE_OTHER): Payer: Medicaid Other | Admitting: Adult Health

## 2022-01-24 DIAGNOSIS — R569 Unspecified convulsions: Secondary | ICD-10-CM | POA: Diagnosis not present

## 2022-01-24 DIAGNOSIS — G43019 Migraine without aura, intractable, without status migrainosus: Secondary | ICD-10-CM

## 2022-01-24 MED ORDER — LEVETIRACETAM 500 MG PO TABS: ORAL_TABLET | ORAL | 3 refills | Status: DC

## 2022-01-24 MED ORDER — AIMOVIG 140 MG/ML ~~LOC~~ SOAJ: 140.0000 mg | SUBCUTANEOUS | 4 refills | Status: DC

## 2022-01-24 MED ORDER — TOPIRAMATE 100 MG PO TABS: 100.0000 mg | ORAL_TABLET | Freq: Every day | ORAL | 3 refills | Status: DC

## 2022-01-24 NOTE — Progress Notes (Signed)
? ? ? ?PATIENT: [name] [name] ?DOB: 1960-12-03 ? ?REASON FOR VISIT: follow up ?HISTORY FROM: patient ? ?Virtual Visit via Video Note ? ?I connected with [name] [name] on 01/24/22 at  8:00 AM EDT by a video enabled telemedicine application located remotely at [facility] and verified that I am speaking with the correct person using two identifiers who was located at their own home. ?  ?I discussed the limitations of evaluation and management by telemedicine and the availability of in person appointments. The patient expressed understanding and agreed to proceed. ? ? ?PATIENT: [name] [name] ?DOB: 1961-01-10 ? ?REASON FOR VISIT: follow up ?HISTORY FROM: patient ?Primary Neurologist: Dr. Jannifer Franklin will transition to Dr. Jaynee Eagles  ? ?HISTORY OF PRESENT ILLNESS: ?Today 01/24/22: ? ?[name] [name] is a 61 year old female seizures and Migraines. She returns today for follow-up.  ? ?Reports that headaches are better. Continue Aimovig monthly injection. Reports that she is able to take aleve and it helps her headaches. Reports that she has about two headaches a week. Continue on Topamax 100 mg daily. Concerned that maybe something is going on in her brain. Last imaging was done in 2015 ? ?No seizures- continue Keppra 500 mg twice a day. Also on lyrica- but not managed by our office.  ? ?HISTORY [name] [name] is a 61 year old right-handed [name] female with a history of lupus associated with interstitial lung disease.  The patient has seizures that have been well controlled over the years, and she has migraine headaches.  Her headaches have become more frequent recently, she is now having 3 to 4 a week.  She is on Topamax, she has been on gabapentin and Lyrica previously.  The patient takes Keppra as well for her seizures.  The patient gets a lot of photophobia and phonophobia with her headaches, nausea without vomiting.  She oftentimes has to lie down with the headache and cannot function.  She reports that she has had  frequent urinary tract infections in the past, she has now on an antibiotic for this reason.  The patient comes back here for further evaluation. ? ?REVIEW OF SYSTEMS: Out of a complete 14 system review of symptoms, the patient complains only of the following symptoms, and all other reviewed systems are negative. ? ?ALLERGIES: ?Allergies  ?Allergen Reactions  ? Shellfish Allergy Anaphylaxis  ? Other   ?  Blood Product Refusal   ? Pregabalin Swelling  ?  Able to tolerate Lyrica (brand name only)  ? Gabapentin Rash  ? ? ?HOME MEDICATIONS: ?Outpatient Medications Prior to Visit  ?Medication Sig Dispense Refill  ? cefdinir (OMNICEF) 300 MG capsule Take 1 capsule (300 mg total) by mouth 2 (two) times daily. (Patient not taking: Reported on 11/15/2021) 20 capsule 0  ? Cyanocobalamin (B-12) 2000 MCG TABS Take 2,000 mcg by mouth daily. 30 tablet 3  ? Erenumab-aooe (AIMOVIG) 140 MG/ML SOAJ Inject 140 mg into the skin every 30 (thirty) days. 1.12 mL 4  ? etodolac (LODINE) 500 MG tablet Take 1 tablet (500 mg total) by mouth 2 (two) times daily. 30 tablet 0  ? fentaNYL (DURAGESIC - DOSED MCG/HR) 50 MCG/HR Place 1 patch (50 mcg total) onto the skin every 3 (three) days. 5 patch 0  ? fluconazole (DIFLUCAN) 150 MG tablet Take 1 tablet (150 mg total) by mouth daily. (Patient not taking: Reported on 06/08/2021) 7 tablet 0  ? folic acid (FOLVITE) 1 MG tablet Take 1 tablet (1 mg total) by mouth daily. 90 tablet 3  ?  furosemide (LASIX) 20 MG tablet Take 1 tablet (20 mg total) by mouth every Monday, Wednesday, and Friday. 90 tablet 0  ? hydroxychloroquine (PLAQUENIL) 200 MG tablet Take 200-400 mg by mouth See admin instructions. Take 200 mg by mouth every other day alternating with 400 mg (2 tablets) every other day    ? levETIRAcetam (KEPPRA) 500 MG tablet TAKE 1 TABLET(500 MG) BY MOUTH TWICE DAILY 180 tablet 3  ? mycophenolate (MYFORTIC) 360 MG TBEC EC tablet Take 360-720 mg by mouth See admin instructions. Take 720mg  in the am and  360mg  in pm (Patient not taking: Reported on 06/08/2021)    ? Naloxone HCl 0.4 MG/0.4ML SOAJ Inject 0.4 mLs into the muscle as needed (overdose).  (Patient not taking: Reported on 11/15/2021)    ? OXYGEN Inhale 2 L/min into the lungs at bedtime.    ? pantoprazole (PROTONIX) 40 MG tablet Take 1 tablet (40 mg total) by mouth 2 (two) times daily. 180 tablet 1  ? polyethylene glycol (MIRALAX / GLYCOLAX) packet Take 17 g by mouth daily as needed for mild constipation or moderate constipation. 14 each 0  ? predniSONE (DELTASONE) 2.5 MG tablet Take 2.5 mg by mouth daily with breakfast.    ? pregabalin (LYRICA) 300 MG capsule Take 300 mg by mouth 2 (two) times daily.    ? ranitidine (ZANTAC) 150 MG capsule Take 150 mg by mouth 2 (two) times daily.    ? rizatriptan (MAXALT) 10 MG tablet Take 1 tab at the onset of migraine.May repeat in 2 hours if needed. Not to exceed 2tabs/24 hours 10 tablet 5  ? rosuvastatin (CRESTOR) 20 MG tablet Take 1 tablet (20 mg total) by mouth daily. 90 tablet 1  ? SYMBICORT 80-4.5 MCG/ACT inhaler Inhale 2 puffs into the lungs 2 (two) times daily.   1  ? topiramate (TOPAMAX) 100 MG tablet Take 1 tablet (100 mg total) by mouth daily. 90 tablet 3  ? traMADol (ULTRAM) 50 MG tablet Take 1-2 tablets (50-100 mg total) by mouth every 4 (four) hours as needed (cough). (Patient taking differently: Take 50-100 mg by mouth every 6 (six) hours as needed for moderate pain.) 84 tablet 0  ? Vitamin D, Ergocalciferol, (DRISDOL) 50000 units CAPS capsule Take 1 capsule (50,000 Units total) by mouth every 7 (seven) days. (Patient taking differently: Take 50,000 Units by mouth every Saturday.) 12 capsule 3  ? ?No facility-administered medications prior to visit.  ? ? ?PAST MEDICAL HISTORY: ?Past Medical History:  ?Diagnosis Date  ? Anemia   ? CHF (congestive heart failure) ([place])   ? Chronic kidney disease   ? Chronic pain   ? COPD (chronic obstructive pulmonary disease) ([place])   ? Fibromyalgia   ? Foley catheter in place    ? History of kidney stones   ? Migraine   ? Migraine without aura, with intractable migraine, so stated, without mention of status migrainosus 05/18/2014  ? Oxygen dependent   ? as needed 2L at hs per patient   ? Peripheral vascular disease ([place])   ? Pneumonia   ? PONV (postoperative nausea and vomiting)   ? Pulmonary fibrosis ([place])   ? Seizures ([place])   ? SLE (systemic lupus erythematosus) ([place]) 12/12/2017  ? UTI (urinary tract infection) 09/30/2018  ? ? ?PAST SURGICAL HISTORY: ?Past Surgical History:  ?Procedure Laterality Date  ? ABDOMINAL HYSTERECTOMY    ? CHOLECYSTECTOMY    ? CYSTOSCOPY W/ URETERAL STENT PLACEMENT Left 10/03/2018  ? Procedure: CYSTOSCOPY WITH RETROGRADE PYELOGRAM/URETERAL  STENT PLACEMENT;  Surgeon: Festus Aloe, MD;  Location: [facility];  Service: Urology;  Laterality: Left;  ? CYSTOSCOPY/URETEROSCOPY/HOLMIUM LASER/STENT PLACEMENT Left 11/05/2018  ? Procedure: CYSTOSCOPY/URETEROSCOPY/HOLMIUM LASER/STENT EXCHANGE;  Surgeon: Festus Aloe, MD;  Location: [facility];  Service: Urology;  Laterality: Left;  ONLY NEEDS 60 MIN  ? EYE SURGERY    ? bilateral cataract surgery   ? Kidney stome surgery  2012  ? Left lung biopsy  2015  ? LUNG BIOPSY Left 11/21/2013  ? Procedure: LUNG BIOPSY;  Surgeon: Grace Isaac, MD;  Location: [place];  Service: Thoracic;  Laterality: Left;  ? VIDEO ASSISTED THORACOSCOPY Left 11/21/2013  ? Procedure: VIDEO ASSISTED THORACOSCOPY;  Surgeon: Grace Isaac, MD;  Location: [place];  Service: Thoracic;  Laterality: Left;  ? VIDEO BRONCHOSCOPY N/A 11/21/2013  ? Procedure: VIDEO BRONCHOSCOPY;  Surgeon: Grace Isaac, MD;  Location: [place];  Service: Thoracic;  Laterality: N/A;  ? ? ?FAMILY HISTORY: ?Family History  ?Problem Relation Age of Onset  ? Hypertension Mother   ? Hepatitis C Mother   ? Diabetes Father   ? Hypertension Father   ? Multiple sclerosis Sister   ? Bipolar disorder Brother   ? Breast cancer Neg Hx   ? ? ?SOCIAL HISTORY: ?Social History  ? ?Socioeconomic History   ? Marital status: Single  ?  Spouse name: Not on file  ? Number of children: 0  ? Years of education: college 3  ? Highest education level: Not on file  ?Occupational History  ? Occupation: unemployed  ?

## 2022-01-25 ENCOUNTER — Telehealth: Payer: Self-pay | Admitting: Adult Health

## 2022-01-25 NOTE — Telephone Encounter (Signed)
medicaid order sent to [facility], [employer] they will reach out to the patient to schedule.  ?

## 2022-02-07 ENCOUNTER — Other Ambulatory Visit: Payer: Self-pay | Admitting: Family Medicine

## 2022-02-07 DIAGNOSIS — R600 Localized edema: Secondary | ICD-10-CM

## 2022-02-07 NOTE — Telephone Encounter (Signed)
Pt had a virtual appt 12/06/21. Rx was last refilled 06/14/20. Refill if appropriate

## 2022-05-05 ENCOUNTER — Other Ambulatory Visit: Payer: Self-pay | Admitting: Internal Medicine

## 2022-05-05 DIAGNOSIS — R600 Localized edema: Secondary | ICD-10-CM

## 2022-05-23 ENCOUNTER — Encounter (HOSPITAL_COMMUNITY): Payer: Self-pay

## 2022-06-19 ENCOUNTER — Other Ambulatory Visit: Payer: Self-pay | Admitting: Family Medicine

## 2022-06-19 DIAGNOSIS — R600 Localized edema: Secondary | ICD-10-CM

## 2022-06-20 NOTE — Telephone Encounter (Signed)
Requested medications are due for refill today.  yes  Requested medications are on the active medications list.  yes  Last refill. 05/05/2022  Future visit scheduled.   no  Notes to clinic.  Over due Mg lab. Pt needs appt.    Requested Prescriptions  Pending Prescriptions Disp Refills   furosemide (LASIX) 20 MG tablet [Pharmacy Med Name: FUROSEMIDE 20MG  TABLETS] 12 tablet 0    Sig: TAKE 1 TABLET BY MOUTH EVERY MONDAY, WEDNESDAY, FRIDAY     Cardiovascular:  Diuretics - Loop Failed - 06/19/2022  2:19 PM      Failed - K in normal range and within 180 days    Potassium  Date Value Ref Range Status  11/22/2021 3.8 3.5 - 5.2 mmol/L Final         Failed - Ca in normal range and within 180 days    Calcium  Date Value Ref Range Status  11/22/2021 8.9 8.7 - 10.2 mg/dL Final         Failed - Na in normal range and within 180 days    Sodium  Date Value Ref Range Status  11/22/2021 143 134 - 144 mmol/L Final         Failed - Cr in normal range and within 180 days    Creat  Date Value Ref Range Status  09/27/2016 1.27 (H) 0.50 - 1.05 mg/dL Final    Comment:      For patients > or = 60 years of age: The upper reference limit for Creatinine is approximately 13% higher for people identified as African-American.      Creatinine, Ser  Date Value Ref Range Status  11/22/2021 1.20 (H) 0.57 - 1.00 mg/dL Final   Creatinine, Urine  Date Value Ref Range Status  10/26/2013 121.76 mg/dL Final         Failed - Cl in normal range and within 180 days    Chloride  Date Value Ref Range Status  11/22/2021 105 96 - 106 mmol/L Final         Failed - Mg Level in normal range and within 180 days    Magnesium  Date Value Ref Range Status  10/03/2018 2.6 (H) 1.7 - 2.4 mg/dL Final    Comment:    Performed at [facility] [place] [address], [place], [place] [address]         Failed - Valid encounter within last 6 months    Recent Outpatient Visits           6 months  ago Other insomnia   [place], Enobong, MD   7 months ago Other systemic lupus erythematosus with other organ involvement [facility])   [facility] Charlott Rakes, MD   1 year ago Annual physical exam   [place] Charlott Rakes, MD   1 year ago Acute cystitis without hematuria   [place] Charlott Rakes, MD   2 years ago Throat pain   [place], Enobong, MD       Future Appointments             In 2 months Ward Givens, NP [facility]            Passed - Last BP in normal range    BP Readings from Last 1 Encounters:  11/15/21 112/79

## 2022-06-21 ENCOUNTER — Telehealth (HOSPITAL_COMMUNITY): Payer: Self-pay

## 2022-06-21 NOTE — Telephone Encounter (Signed)
No response from pt regarding PR.   Closed referral. 

## 2022-08-09 ENCOUNTER — Telehealth: Payer: Medicaid Other | Admitting: Adult Health

## 2022-08-22 ENCOUNTER — Telehealth (INDEPENDENT_AMBULATORY_CARE_PROVIDER_SITE_OTHER): Payer: Medicaid Other | Admitting: Adult Health

## 2022-08-22 DIAGNOSIS — R569 Unspecified convulsions: Secondary | ICD-10-CM | POA: Diagnosis not present

## 2022-08-22 DIAGNOSIS — G43009 Migraine without aura, not intractable, without status migrainosus: Secondary | ICD-10-CM

## 2022-08-22 NOTE — Progress Notes (Signed)
PATIENT: [name] [name] DOB: 05/11/1961  REASON FOR VISIT: follow up HISTORY FROM: patient  Virtual Visit via Video Note  I connected with [name] [name] on 08/22/22 at  3:00 PM EST by a video enabled telemedicine application [facility] and verified that I am speaking with the correct person using two identifiers who was located at their own home.   I discussed the limitations of evaluation and management by telemedicine and the availability of in person appointments. The patient expressed understanding and agreed to proceed.   PATIENT: [name] [name] DOB: 10/22/1960  REASON FOR VISIT: follow up HISTORY FROM: patient Primary Neurologist: Dr. Anne HahnWillis will transition to Dr. Lucia GaskinsAhern   HISTORY OF PRESENT ILLNESS: Today 08/22/22:  [name] [name] is a 61 year old female with a history of migraine headaches and seizures.  She returns today for follow-up. Reports headaches are better.  Continues Topamax and Aimovig.  Denies any seizure events.  Remains on Keppra.  [facility].  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  5/9/23[name] She returns today for follow-up.   Reports that headaches are better. Continue Aimovig monthly injection. Reports that she is able to take aleve and it helps her headaches. Reports that she has about two headaches a week. Continue on Topamax 100 mg daily. Concerned that maybe something is going on in her brain. Last imaging was done in 2015  No seizures- continue Keppra 500 mg twice a day. Also on lyrica- but not managed by our office.   [name] with a history of lupus associated with interstitial lung disease.  The patient has seizures that have been well controlled over the years, and she has migraine headaches.  Her headaches have  become more frequent recently, she is now having 3 to 4 a week.  She is on Topamax, she has been on gabapentin and Lyrica previously.  The patient takes Keppra as well for her seizures.  The patient gets a lot of photophobia and phonophobia with her headaches, nausea without vomiting.  She oftentimes has to lie down with the headache and cannot function.  She reports that she has had frequent urinary tract infections in the past, she has now on an antibiotic for this reason.  The patient comes back here for further evaluation.  REVIEW OF SYSTEMS: Out of a complete 14 system review of symptoms, the patient complains only of the following symptoms, and all other reviewed systems are negative.  ALLERGIES: Allergies  Allergen Reactions   Shellfish Allergy Anaphylaxis   Other     Blood Product Refusal    Pregabalin Swelling    Able to tolerate Lyrica (brand name only)   Gabapentin Rash    HOME MEDICATIONS: Outpatient Medications Prior to Visit  Medication Sig Dispense Refill   cefdinir (OMNICEF) 300 MG capsule Take 1 capsule (300 mg total) by mouth 2 (two) times daily. (Patient not taking: Reported on 11/15/2021) 20 capsule 0   Cyanocobalamin (B-12) 2000 MCG TABS Take 2,000 mcg by mouth daily. 30 tablet 3   Erenumab-aooe (AIMOVIG) 140 MG/ML SOAJ Inject 140 mg into the skin every 30 (thirty) days. 1.12 mL 4   etodolac (LODINE) 500 MG tablet Take 1 tablet (500 mg total) by mouth 2 (two) times daily. 30 tablet 0   fentaNYL (DURAGESIC - DOSED  MCG/HR) 50 MCG/HR Place 1 patch (50 mcg total) onto the skin every 3 (three) days. 5 patch 0   fluconazole (DIFLUCAN) 150 MG tablet Take 1 tablet (150 mg total) by mouth daily. (Patient not taking: Reported on 06/08/2021) 7 tablet 0   folic acid (FOLVITE) 1 MG tablet Take 1 tablet (1 mg total) by mouth daily. 90 tablet 3   furosemide (LASIX) 20 MG tablet TAKE 1 TABLET BY MOUTH EVERY MONDAY, WEDNESDAY, FRIDAY 12 tablet 0   hydroxychloroquine (PLAQUENIL) 200 MG  tablet Take 200-400 mg by mouth See admin instructions. Take 200 mg by mouth every other day alternating with 400 mg (2 tablets) every other day     levETIRAcetam (KEPPRA) 500 MG tablet TAKE 1 TABLET(500 MG) BY MOUTH TWICE DAILY 180 tablet 3   mycophenolate (MYFORTIC) 360 MG TBEC EC tablet Take 360-720 mg by mouth See admin instructions. Take 720mg  in the am and 360mg  in pm (Patient not taking: Reported on 06/08/2021)     Naloxone HCl 0.4 MG/0.4ML SOAJ Inject 0.4 mLs into the muscle as needed (overdose).  (Patient not taking: Reported on 11/15/2021)     OXYGEN Inhale 2 L/min into the lungs at bedtime.     pantoprazole (PROTONIX) 40 MG tablet Take 1 tablet (40 mg total) by mouth 2 (two) times daily. 180 tablet 1   polyethylene glycol (MIRALAX / GLYCOLAX) packet Take 17 g by mouth daily as needed for mild constipation or moderate constipation. 14 each 0   predniSONE (DELTASONE) 2.5 MG tablet Take 2.5 mg by mouth daily with breakfast.     pregabalin (LYRICA) 300 MG capsule Take 300 mg by mouth 2 (two) times daily.     ranitidine (ZANTAC) 150 MG capsule Take 150 mg by mouth 2 (two) times daily.     rizatriptan (MAXALT) 10 MG tablet Take 1 tab at the onset of migraine.May repeat in 2 hours if needed. Not to exceed 2tabs/24 hours 10 tablet 5   rosuvastatin (CRESTOR) 20 MG tablet Take 1 tablet (20 mg total) by mouth daily. 90 tablet 1   SYMBICORT 80-4.5 MCG/ACT inhaler Inhale 2 puffs into the lungs 2 (two) times daily.   1   topiramate (TOPAMAX) 100 MG tablet Take 1 tablet (100 mg total) by mouth daily. 90 tablet 3   traMADol (ULTRAM) 50 MG tablet Take 1-2 tablets (50-100 mg total) by mouth every 4 (four) hours as needed (cough). (Patient taking differently: Take 50-100 mg by mouth every 6 (six) hours as needed for moderate pain.) 84 tablet 0   Vitamin D, Ergocalciferol, (DRISDOL) 50000 units CAPS capsule Take 1 capsule (50,000 Units total) by mouth every 7 (seven) days. (Patient taking differently: Take  50,000 Units by mouth every Saturday.) 12 capsule 3   No facility-administered medications prior to visit.    PAST MEDICAL HISTORY: Past Medical History:  Diagnosis Date   Anemia    CHF (congestive heart failure) (HCC)    Chronic kidney disease    Chronic pain    COPD (chronic obstructive pulmonary disease) (HCC)    Fibromyalgia    Foley catheter in place    History of kidney stones    Migraine    Migraine without aura, with intractable migraine, so stated, without mention of status migrainosus 05/18/2014   Oxygen dependent    as needed 2L at hs per patient    Peripheral vascular disease (HCC)    Pneumonia    PONV (postoperative nausea and vomiting)    Pulmonary fibrosis (  HCC)    Seizures (HCC)    SLE (systemic lupus erythematosus) (HCC) 12/12/2017   UTI (urinary tract infection) 09/30/2018    PAST SURGICAL HISTORY: Past Surgical History:  Procedure Laterality Date   ABDOMINAL HYSTERECTOMY     CHOLECYSTECTOMY     CYSTOSCOPY W/ URETERAL STENT PLACEMENT Left 10/03/2018   Procedure: CYSTOSCOPY WITH RETROGRADE PYELOGRAM/URETERAL STENT PLACEMENT;  Surgeon: Jerilee Field, MD;  [facility];  Service: Urology;  Laterality: Left;   CYSTOSCOPY/URETEROSCOPY/HOLMIUM LASER/STENT PLACEMENT Left 11/05/2018   Procedure: CYSTOSCOPY/URETEROSCOPY/HOLMIUM LASER/STENT EXCHANGE;  Surgeon: Jerilee Field, MD;  [facility];  Service: Urology;  Laterality: Left;  ONLY NEEDS 60 MIN   EYE SURGERY     bilateral cataract surgery    Kidney stome surgery  2012   Left lung biopsy  2015   LUNG BIOPSY Left 11/21/2013   Procedure: LUNG BIOPSY;  Surgeon: Delight Ovens, MD;  Location: [facility] OR;  Service: Thoracic;  Laterality: Left;   VIDEO ASSISTED THORACOSCOPY Left 11/21/2013   Procedure: VIDEO ASSISTED THORACOSCOPY;  Surgeon: Delight Ovens, MD;  Location: [facility] OR;  Service: Thoracic;  Laterality: Left;   VIDEO BRONCHOSCOPY N/A 11/21/2013   Procedure: VIDEO BRONCHOSCOPY;  Surgeon: Delight Ovens, MD;  Location: [facility] OR;  Service: Thoracic;  Laterality: N/A;    FAMILY HISTORY: Family History  Problem Relation Age of Onset   Hypertension Mother    Hepatitis C Mother    Diabetes Father    Hypertension Father    Multiple sclerosis Sister    Bipolar disorder Brother    Breast cancer Neg Hx     SOCIAL HISTORY: Social History   Socioeconomic History   Marital status: Single    Spouse name: Not on file   Number of children: 0   Years of education: college 3   Highest education level: Not on file  Occupational History   Occupation: unemployed  Tobacco Use   Smoking status: Former    Packs/day: 2.00    Years: 20.00    Total pack years: 40.00    Types: Cigarettes    Quit date: 04/17/2005    Years since quitting: 17.3   Smokeless tobacco: Never  Vaping Use   Vaping Use: Never used  Substance and Sexual Activity   Alcohol use: No   Drug use: No   Sexual activity: Never  Other Topics Concern   Not on file  Social History Narrative   Not on file   Social Determinants of Health   Financial Resource Strain: Not on file  Food Insecurity: Not on file  Transportation Needs: Not on file  Physical Activity: Not on file  Stress: Not on file  Social Connections: Not on file  Intimate Partner Violence: Not on file      PHYSICAL EXAM Generalized: Well developed, in no acute distress   Neurological examination  Mentation: Alert oriented to time, place, history taking. Follows all commands speech and language fluent Cranial nerve II-XII:Extraocular movements were full. Facial symmetry noted. uvula tongue midline. Head turning and shoulder shrug  were normal and symmetric.   DIAGNOSTIC DATA (LABS, IMAGING, TESTING) - I reviewed patient records, labs, notes, testing and imaging myself where available.  Lab Results  Component Value Date   WBC 7.4 11/22/2021   HGB 14.3 11/22/2021   HCT 45.7 11/22/2021   MCV 85 11/22/2021   PLT 227 11/22/2021       Component Value Date/Time   NA 143 11/22/2021 1117   K 3.8 11/22/2021  1117   CL 105 11/22/2021 1117   CO2 20 11/22/2021 1117   GLUCOSE 93 11/22/2021 1117   GLUCOSE 81 11/04/2018 1148   BUN 11 11/22/2021 1117   CREATININE 1.20 (H) 11/22/2021 1117   CREATININE 1.27 (H) 09/27/2016 1006   CALCIUM 8.9 11/22/2021 1117   PROT 6.9 11/22/2021 1117   ALBUMIN 4.2 11/22/2021 1117   AST 17 11/22/2021 1117   ALT 9 11/22/2021 1117   ALKPHOS 272 (H) 11/22/2021 1117   BILITOT 0.4 11/22/2021 1117   GFRNONAA 62 06/14/2020 1349   GFRNONAA 48 (L) 09/27/2016 1006   GFRAA 72 06/14/2020 1349   GFRAA 55 (L) 09/27/2016 1006   Lab Results  Component Value Date   CHOL 250 (H) 11/22/2021   HDL 53 11/22/2021   LDLCALC 173 (H) 11/22/2021   TRIG 134 11/22/2021   CHOLHDL 3.5 08/08/2018   Lab Results  Component Value Date   HGBA1C 5.2 11/22/2021   Lab Results  Component Value Date   VITAMINB12 231 10/07/2013   Lab Results  Component Value Date   TSH 0.972 09/10/2014      ASSESSMENT AND PLAN 61 y.o. year old female  has a past medical history of Anemia, CHF (congestive heart failure) (HCC), Chronic kidney disease, Chronic pain, COPD (chronic obstructive pulmonary disease) (HCC), Fibromyalgia, Foley catheter in place, History of kidney stones, Migraine, Migraine without aura, with intractable migraine, so stated, without mention of status migrainosus (05/18/2014), Oxygen dependent, Peripheral vascular disease (HCC), Pneumonia, PONV (postoperative nausea and vomiting), Pulmonary fibrosis (HCC), Seizures (HCC), SLE (systemic lupus erythematosus) (HCC) (12/12/2017), and UTI (urinary tract infection) (09/30/2018). here with:  Migraines Seizures  Continue Aimovig monthly injection Continue Topamax 100 mg daily Continue Keppra 500 mg BID.  Can try Nurtec or Ubrelvy for abortive therapy. Would avoid triptans due to history of peripheral artery disease FU in 6 months or sooner if needed.     Butch Penny, MSN, NP-C 08/22/2022, 3:06 PM [facility] [address] [place], [place] [address] [phone]

## 2022-10-01 ENCOUNTER — Other Ambulatory Visit: Payer: Self-pay | Admitting: Adult Health

## 2022-10-18 ENCOUNTER — Other Ambulatory Visit (HOSPITAL_COMMUNITY): Payer: Self-pay

## 2022-11-11 ENCOUNTER — Other Ambulatory Visit: Payer: Self-pay | Admitting: Family Medicine

## 2022-11-11 DIAGNOSIS — K2100 Gastro-esophageal reflux disease with esophagitis, without bleeding: Secondary | ICD-10-CM

## 2022-11-13 NOTE — Telephone Encounter (Signed)
Routing to SW

## 2022-11-30 ENCOUNTER — Telehealth: Payer: Self-pay | Admitting: *Deleted

## 2022-12-07 ENCOUNTER — Other Ambulatory Visit (HOSPITAL_COMMUNITY): Payer: Self-pay

## 2022-12-08 ENCOUNTER — Other Ambulatory Visit (HOSPITAL_COMMUNITY): Payer: Self-pay

## 2022-12-08 NOTE — Telephone Encounter (Signed)
Patient Advocate Encounter  Prior Authorization for Aimovig 140 mg has been approved.    PA# [id number] Confirmation #: [id number] W Insurance [place] Medicaid Effective dates: 12/08/2022 through 12/08/2023      Lyndel Safe, [place] Patient Advocate Specialist [place] Patient Advocate Team Direct Number: [phone]  Fax: [phone]

## 2022-12-11 NOTE — Telephone Encounter (Signed)
Sent patient mychart message to make her aware of approval

## 2022-12-13 ENCOUNTER — Ambulatory Visit: Payer: Medicaid Other | Attending: Family Medicine | Admitting: Family Medicine

## 2022-12-13 VITALS — BP 80/60 | HR 87 | Wt 156.2 lb

## 2022-12-13 DIAGNOSIS — R569 Unspecified convulsions: Secondary | ICD-10-CM | POA: Diagnosis not present

## 2022-12-13 DIAGNOSIS — I739 Peripheral vascular disease, unspecified: Secondary | ICD-10-CM | POA: Insufficient documentation

## 2022-12-13 DIAGNOSIS — K0889 Other specified disorders of teeth and supporting structures: Secondary | ICD-10-CM

## 2022-12-13 DIAGNOSIS — I959 Hypotension, unspecified: Secondary | ICD-10-CM | POA: Diagnosis not present

## 2022-12-13 DIAGNOSIS — M3214 Glomerular disease in systemic lupus erythematosus: Secondary | ICD-10-CM | POA: Diagnosis not present

## 2022-12-13 DIAGNOSIS — Z131 Encounter for screening for diabetes mellitus: Secondary | ICD-10-CM | POA: Diagnosis not present

## 2022-12-13 DIAGNOSIS — R609 Edema, unspecified: Secondary | ICD-10-CM | POA: Diagnosis not present

## 2022-12-13 DIAGNOSIS — J849 Interstitial pulmonary disease, unspecified: Secondary | ICD-10-CM

## 2022-12-13 DIAGNOSIS — J9611 Chronic respiratory failure with hypoxia: Secondary | ICD-10-CM | POA: Insufficient documentation

## 2022-12-13 DIAGNOSIS — H9190 Unspecified hearing loss, unspecified ear: Secondary | ICD-10-CM

## 2022-12-13 DIAGNOSIS — G894 Chronic pain syndrome: Secondary | ICD-10-CM

## 2022-12-13 DIAGNOSIS — R600 Localized edema: Secondary | ICD-10-CM

## 2022-12-13 DIAGNOSIS — M3219 Other organ or system involvement in systemic lupus erythematosus: Secondary | ICD-10-CM

## 2022-12-13 MED ORDER — ROSUVASTATIN CALCIUM 20 MG PO TABS: 20.0000 mg | ORAL_TABLET | Freq: Every day | ORAL | 1 refills | Status: DC

## 2022-12-13 MED ORDER — FUROSEMIDE 20 MG PO TABS: ORAL_TABLET | ORAL | 1 refills | Status: DC

## 2022-12-13 MED ORDER — MIDODRINE HCL 5 MG PO TABS: 5.0000 mg | ORAL_TABLET | Freq: Three times a day (TID) | ORAL | 1 refills | Status: DC

## 2022-12-13 NOTE — Patient Instructions (Signed)
Hypotension As your heart beats, it forces blood through your body. This force is called blood pressure. If you have hypotension, you have low blood pressure.  When your blood pressure is too low, you may not get enough blood to your brain or other parts of your body. This may cause you to feel weak, light-headed, have a fast heartbeat, or even faint. Low blood pressure may be harmless, or it may cause serious problems. What are the causes? Blood loss. Not enough water in the body (dehydration). Heart problems. Hormone problems. Pregnancy. A very bad infection. Not having enough of certain nutrients. Very bad allergic reactions. Certain medicines. What increases the risk? Age. The risk increases as you get older. Conditions that affect the heart or the brain and spinal cord (central nervous system). What are the signs or symptoms? Feeling: Weak. Light-headed. Dizzy. Tired (fatigued). Blurred vision. Fast heartbeat. Fainting, in very bad cases. How is this treated? Changing your diet. This may involve drinking more water or including more salt (sodium) in your diet by eating high-salt foods. Taking medicines to raise your blood pressure. Changing how much you take (the dosage) of some of your medicines. Wearing compression stockings. These stockings help to prevent blood clots and reduce swelling in your legs. In some cases, you may need to go to the hospital to: Receive fluids through an IV tube. Receive donated blood through an IV tube (transfusion). Get treated for an infection or heart problems, if this applies. Be monitored while medicines that you are taking wear off. Follow these instructions at home: Eating and drinking  Drink enough fluids to keep your pee (urine) pale yellow. Eat a healthy diet. Follow instructions from your doctor about what you can eat or drink. A healthy diet includes: Fresh fruits and vegetables. Whole grains. Low-fat (lean) meats. Low-fat  dairy products. If told, include more salt in your diet. Do not add extra salt to your diet unless your doctor tells you to. Eat small meals often. Avoid standing up quickly after you eat. Medicines Take over-the-counter and prescription medicines only as told by your doctor. Follow instructions from your doctor about changing how much you take of your medicines, if this applies. Do not stop or change any of your medicines on your own. General instructions  Wear compression stockings as told by your doctor. Get up slowly from lying down or sitting. Avoid hot showers and a lot of heat as told by your doctor. Return to your normal activities when your doctor says that it is safe. Do not smoke or use any products that contain nicotine or tobacco. If you need help quitting, ask your doctor. Keep all follow-up visits. Contact a doctor if: You vomit. You have watery poop (diarrhea). You have a fever for more than 2-3 days. You feel more thirsty than normal. You feel weak and tired. Get help right away if: You have chest pain. You have a fast or uneven heartbeat. You lose feeling (have numbness) in any part of your body. You cannot move your arms or your legs. You have trouble talking. You get sweaty or feel light-headed. You faint. You have trouble breathing. You have trouble staying awake. You feel mixed up (confused). These symptoms may be an emergency. Get help right away. Call 911. Do not wait to see if the symptoms will go away. Do not drive yourself to the hospital. Summary Hypotension is also called low blood pressure. It is when the force of blood pumping through your body   is too weak. Hypotension may be harmless, or it may cause serious problems. Treatment may include changing your diet and medicines, and wearing compression stockings. In very bad cases, you may need to go to the hospital. This information is not intended to replace advice given to you by your health care  provider. Make sure you discuss any questions you have with your health care provider. Document Revised: 04/25/2021 Document Reviewed: 04/25/2021 Elsevier Patient Education  2023 Elsevier Inc.  

## 2022-12-13 NOTE — Progress Notes (Unsigned)
Dental referral

## 2022-12-13 NOTE — Progress Notes (Unsigned)
Subjective:  Patient ID: [name] [name], female    DOB: November 20, 1960  Age: 62 y.o. MRN: [id number]  CC: Dental Pain   HPI [name] [name] is a 62 y.o. year old female with a history of Fibromyalgia,systemic lupus erythematosus with interstitial lung disease,  chronic respiratory failure (on 2L at night), chronic low back pain (followed by pain management), GERD, seizures, migraines who , history of cystoscopy, left ureteroscopy with laser lithotripsy and stent exchange, peripheral arterial disease, s/pTAH in the 90s (secondary to ?Endometrial ca in [place])   Interval History:  She needs recertification for her Oxygen as she does not see her Pulmonologist till 02/2023.  She uses her oxygen just at night.  Sometimes she does not need Symbicort twice daily but rather and uses her MDI as needed as her respiratory symptoms are controlled.  Currently under the care of rheumatology She has upcoming appointment with Vascular Also has a follow up with Ortho for spine issues. Seizures is managed by neurology.  BP is low and she is not on an antihypertensive.  She has furosemide on her list which she takes for pedal edema but has run out of this and is requesting refills.  She endorses sometimes feeling lightheaded and sleepy. She would also like a handicap form for handicap placard. Requests a dental referral due to dental pain. Also feels she has noticed reduced hearing and can hear herself speak louder. Past Medical History:  Diagnosis Date   Anemia    CHF (congestive heart failure) (HCC)    Chronic kidney disease    Chronic pain    COPD (chronic obstructive pulmonary disease) (HCC)    Fibromyalgia    Foley catheter in place    History of kidney stones    Migraine    Migraine without aura, with intractable migraine, so stated, without mention of status migrainosus 05/18/2014   Oxygen dependent    as needed 2L at hs per patient    Peripheral vascular disease ([place])    Pneumonia    PONV  (postoperative nausea and vomiting)    Pulmonary fibrosis (HCC)    Seizures ([place])    SLE (systemic lupus erythematosus) ([place]) 12/12/2017   UTI (urinary tract infection) 09/30/2018    Past Surgical History:  Procedure Laterality Date   ABDOMINAL HYSTERECTOMY     CHOLECYSTECTOMY     CYSTOSCOPY W/ URETERAL STENT PLACEMENT Left 10/03/2018   Procedure: CYSTOSCOPY WITH RETROGRADE PYELOGRAM/URETERAL STENT PLACEMENT;  Surgeon: Festus Aloe, MD;  Location: [facility];  Service: Urology;  Laterality: Left;   CYSTOSCOPY/URETEROSCOPY/HOLMIUM LASER/STENT PLACEMENT Left 11/05/2018   Procedure: CYSTOSCOPY/URETEROSCOPY/HOLMIUM LASER/STENT EXCHANGE;  Surgeon: Festus Aloe, MD;  Location: [facility];  Service: Urology;  Laterality: Left;  ONLY NEEDS 60 MIN   EYE SURGERY     bilateral cataract surgery    Kidney stome surgery  2012   Left lung biopsy  2015   LUNG BIOPSY Left 11/21/2013   Procedure: LUNG BIOPSY;  Surgeon: Grace Isaac, MD;  Location: [facility];  Service: Thoracic;  Laterality: Left;   VIDEO ASSISTED THORACOSCOPY Left 11/21/2013   Procedure: VIDEO ASSISTED THORACOSCOPY;  Surgeon: Grace Isaac, MD;  Location: [facility];  Service: Thoracic;  Laterality: Left;   VIDEO BRONCHOSCOPY N/A 11/21/2013   Procedure: VIDEO BRONCHOSCOPY;  Surgeon: Grace Isaac, MD;  Location: [facility];  Service: Thoracic;  Laterality: N/A;    Family History  Problem Relation Age of Onset   Hypertension Mother    Hepatitis C Mother  Diabetes Father    Hypertension Father    Multiple sclerosis Sister    Bipolar disorder Brother    Breast cancer Neg Hx     Social History   Socioeconomic History   Marital status: Single    Spouse name: Not on file   Number of children: 0   Years of education: college 3   Highest education level: Not on file  Occupational History   Occupation: unemployed  Tobacco Use   Smoking status: Former    Packs/day: 2.00    Years: 20.00    Additional pack years: 0.00    Total pack  years: 40.00    Types: Cigarettes    Quit date: 04/17/2005    Years since quitting: 17.6   Smokeless tobacco: Never  Vaping Use   Vaping Use: Never used  Substance and Sexual Activity   Alcohol use: No   Drug use: No   Sexual activity: Never  Other Topics Concern   Not on file  Social History Narrative   Not on file   Social Determinants of Health   Financial Resource Strain: Not on file  Food Insecurity: Not on file  Transportation Needs: Not on file  Physical Activity: Not on file  Stress: Not on file  Social Connections: Not on file    Allergies  Allergen Reactions   Shellfish Allergy Anaphylaxis   Other     Blood Product Refusal    Pregabalin Swelling    Able to tolerate Lyrica (brand name only)   Gabapentin Rash    Outpatient Medications Prior to Visit  Medication Sig Dispense Refill   Cyanocobalamin (B-12) 2000 MCG TABS Take 2,000 mcg by mouth daily. 30 tablet 3   Erenumab-aooe (AIMOVIG) 140 MG/ML SOAJ ADMINISTER 1 ML UNDER THE SKIN EVERY 30 DAYS 1 mL 4   etodolac (LODINE) 500 MG tablet Take 1 tablet (500 mg total) by mouth 2 (two) times daily. 30 tablet 0   fentaNYL (DURAGESIC - DOSED MCG/HR) 50 MCG/HR Place 1 patch (50 mcg total) onto the skin every 3 (three) days. 5 patch 0   folic acid (FOLVITE) 1 MG tablet Take 1 tablet (1 mg total) by mouth daily. 90 tablet 3   hydroxychloroquine (PLAQUENIL) 200 MG tablet Take 200-400 mg by mouth See admin instructions. Take 200 mg by mouth every other day alternating with 400 mg (2 tablets) every other day     levETIRAcetam (KEPPRA) 500 MG tablet TAKE 1 TABLET(500 MG) BY MOUTH TWICE DAILY 180 tablet 3   mycophenolate (MYFORTIC) 360 MG TBEC EC tablet Take 360-720 mg by mouth See admin instructions. Take 720mg  in the am and 360mg  in pm     Naloxone HCl 0.4 MG/0.4ML SOAJ Inject 0.4 mLs into the muscle as needed (overdose).     OXYGEN Inhale 2 L/min into the lungs at bedtime.     pantoprazole (PROTONIX) 40 MG tablet TAKE 1  TABLET(40 MG) BY MOUTH TWICE DAILY 180 tablet 1   polyethylene glycol (MIRALAX / GLYCOLAX) packet Take 17 g by mouth daily as needed for mild constipation or moderate constipation. 14 each 0   predniSONE (DELTASONE) 2.5 MG tablet Take 2.5 mg by mouth daily with breakfast.     pregabalin (LYRICA) 300 MG capsule Take 300 mg by mouth 2 (two) times daily.     rizatriptan (MAXALT) 10 MG tablet Take 1 tab at the onset of migraine.May repeat in 2 hours if needed. Not to exceed 2tabs/24 hours 10 tablet 5  SYMBICORT 80-4.5 MCG/ACT inhaler Inhale 2 puffs into the lungs 2 (two) times daily.   1   topiramate (TOPAMAX) 100 MG tablet Take 1 tablet (100 mg total) by mouth daily. 90 tablet 3   traMADol (ULTRAM) 50 MG tablet Take 1-2 tablets (50-100 mg total) by mouth every 4 (four) hours as needed (cough). (Patient taking differently: Take 50-100 mg by mouth every 6 (six) hours as needed for moderate pain.) 84 tablet 0   furosemide (LASIX) 20 MG tablet TAKE 1 TABLET BY MOUTH EVERY MONDAY, WEDNESDAY, FRIDAY 12 tablet 0   rosuvastatin (CRESTOR) 20 MG tablet Take 1 tablet (20 mg total) by mouth daily. 90 tablet 1   cefdinir (OMNICEF) 300 MG capsule Take 1 capsule (300 mg total) by mouth 2 (two) times daily. 20 capsule 0   fluconazole (DIFLUCAN) 150 MG tablet Take 1 tablet (150 mg total) by mouth daily. 7 tablet 0   ranitidine (ZANTAC) 150 MG capsule Take 150 mg by mouth 2 (two) times daily.     Vitamin D, Ergocalciferol, (DRISDOL) 50000 units CAPS capsule Take 1 capsule (50,000 Units total) by mouth every 7 (seven) days. (Patient taking differently: Take 50,000 Units by mouth every Saturday.) 12 capsule 3   No facility-administered medications prior to visit.     ROS Review of Systems  Constitutional:  Negative for activity change and appetite change.  HENT:  Positive for dental problem. Negative for sinus pressure and sore throat.   Respiratory:  Negative for chest tightness, shortness of breath and wheezing.    Cardiovascular:  Negative for chest pain and palpitations.  Gastrointestinal:  Negative for abdominal distention, abdominal pain and constipation.  Genitourinary: Negative.   Musculoskeletal: Negative.   Psychiatric/Behavioral:  Negative for behavioral problems and dysphoric mood.     Objective:  BP (!) 80/60   Pulse 87   Wt 156 lb 3.2 oz (70.9 kg)   SpO2 99%   BMI 29.51 kg/m      12/13/2022    4:31 PM 11/15/2021    3:26 PM 06/08/2021    2:12 PM  BP/Weight  Systolic BP 80 [id number]  Diastolic BP 60 79 82  Wt. (Lbs) 156.2 153.6 167  BMI 29.51 kg/m2 29.02 kg/m2 31.55 kg/m2      Physical Exam Constitutional:      Appearance: She is well-developed.  HENT:     Right Ear: Tympanic membrane normal.     Left Ear: Tympanic membrane normal.  Cardiovascular:     Rate and Rhythm: Normal rate.     Heart sounds: Normal heart sounds. No murmur heard. Pulmonary:     Effort: Pulmonary effort is normal.     Breath sounds: Normal breath sounds. No wheezing or rales.  Chest:     Chest wall: No tenderness.  Abdominal:     General: Bowel sounds are normal. There is no distension.     Palpations: Abdomen is soft. There is no mass.     Tenderness: There is no abdominal tenderness.  Musculoskeletal:        General: Normal range of motion.     Right lower leg: Edema present.     Left lower leg: Edema present.  Neurological:     Mental Status: She is alert and oriented to person, place, and time.  Psychiatric:        Mood and Affect: Mood normal.        Latest Ref Rng & Units 11/22/2021   11:17 AM 06/14/2020  1:49 PM 11/04/2018   11:48 AM  CMP  Glucose 70 - 99 mg/dL 93  81  81   BUN 6 - 24 mg/dL 11  7  18    Creatinine 0.57 - 1.00 mg/dL 1.20  1.00  0.83   Sodium 134 - 144 mmol/L 143  141  141   Potassium 3.5 - 5.2 mmol/L 3.8  3.6  3.8   Chloride 96 - 106 mmol/L 105  101  111   CO2 20 - 29 mmol/L 20  24  24    Calcium 8.7 - 10.2 mg/dL 8.9  9.1  9.1   Total Protein 6.0 - 8.5 g/dL  6.9  6.9    Total Bilirubin 0.0 - 1.2 mg/dL 0.4  0.6    Alkaline Phos 44 - 121 IU/L 272  173    AST 0 - 40 IU/L 17  13    ALT 0 - 32 IU/L 9  5      Lipid Panel     Component Value Date/Time   CHOL 250 (H) 11/22/2021 1117   TRIG 134 11/22/2021 1117   HDL 53 11/22/2021 1117   CHOLHDL 3.5 08/08/2018 1152   CHOLHDL 5.3 09/10/2014 1142   VLDL 36 09/10/2014 1142   LDLCALC 173 (H) 11/22/2021 1117    CBC    Component Value Date/Time   WBC 7.4 11/22/2021 1117   WBC 8.0 11/04/2018 1148   RBC 5.39 (H) 11/22/2021 1117   RBC 4.24 11/04/2018 1148   HGB 14.3 11/22/2021 1117   HCT 45.7 11/22/2021 1117   PLT 227 11/22/2021 1117   MCV 85 11/22/2021 1117   MCH 26.5 (L) 11/22/2021 1117   MCH 25.7 (L) 11/04/2018 1148   MCHC 31.3 (L) 11/22/2021 1117   MCHC 28.4 (L) 11/04/2018 1148   RDW 13.5 11/22/2021 1117   LYMPHSABS 2.9 11/22/2021 1117   MONOABS 0.8 10/02/2018 0319   EOSABS 0.1 11/22/2021 1117   BASOSABS 0.0 11/22/2021 1117    Lab Results  Component Value Date   HGBA1C 5.2 11/22/2021    Assessment & Plan:  1. Chronic pain syndrome Currently managed by pain management - CMP14+EGFR  2. Pain, dental - Ambulatory referral to Dentistry  3. Screening for diabetes mellitus - Hemoglobin A1c  4. Hypotension, unspecified hypotension type Soft blood pressure and she is intermittently symptomatic Will place on midodrine - midodrine (PROAMATINE) 5 MG tablet; Take 1 tablet (5 mg total) by mouth 3 (three) times daily with meals.  Dispense: 90 tablet; Refill: 1  5. Pedal edema Advised to only use furosemide if blood pressure is above 110/70 Encouraged to comply with a low-sodium diet, elevate feet, use compression stockings - furosemide (LASIX) 20 MG tablet; 3 times a week as needed for pedal edema.  Do not take if blood pressure is less than 110/70  Dispense: 30 tablet; Refill: 1  6. Other systemic lupus erythematosus with other organ involvement (HCC) Continue Plaquenil Follow-up  with rheumatology  7. Seizures ([place]) Stable Management per neurology  8. Interstitial lung disease (HCC) Stable Currently on mycophenolate Also on Symbicort and MDI Follow-up with pulmonary  9. Hearing loss, unspecified hearing loss type, unspecified laterality No evidence of cerumen impaction in both ears - Ambulatory referral to Audiology  10.  Chronic hypoxic respiratory failure Remains on 2 L of oxygen at night. We have not received any forms from the DME company and have advised the patient that once we receive, we will be in touch with her  Meds ordered  this encounter  Medications   midodrine (PROAMATINE) 5 MG tablet    Sig: Take 1 tablet (5 mg total) by mouth 3 (three) times daily with meals.    Dispense:  90 tablet    Refill:  1   furosemide (LASIX) 20 MG tablet    Sig: 3 times a week as needed for pedal edema.  Do not take if blood pressure is less than 110/70    Dispense:  30 tablet    Refill:  1   rosuvastatin (CRESTOR) 20 MG tablet    Sig: Take 1 tablet (20 mg total) by mouth daily.    Dispense:  90 tablet    Refill:  1    Follow-up: Return in about 6 months (around 06/15/2023).       [place] Rakes, MD, FAAFP. [facility] and [place] [place], [place]   12/13/2022, 5:05 PM

## 2022-12-14 LAB — CMP14+EGFR
ALT: 5 IU/L (ref 0–32)
AST: 15 IU/L (ref 0–40)
Albumin/Globulin Ratio: 1.7 (ref 1.2–2.2)
Albumin: 4.3 g/dL (ref 3.8–4.9)
Alkaline Phosphatase: 298 IU/L — ABNORMAL HIGH (ref 44–121)
BUN/Creatinine Ratio: 9 — ABNORMAL LOW (ref 12–28)
BUN: 12 mg/dL (ref 8–27)
Bilirubin Total: 0.3 mg/dL (ref 0.0–1.2)
CO2: 16 mmol/L — ABNORMAL LOW (ref 20–29)
Calcium: 8.7 mg/dL (ref 8.7–10.3)
Chloride: 111 mmol/L — ABNORMAL HIGH (ref 96–106)
Creatinine, Ser: 1.3 mg/dL — ABNORMAL HIGH (ref 0.57–1.00)
Globulin, Total: 2.5 g/dL (ref 1.5–4.5)
Glucose: 101 mg/dL — ABNORMAL HIGH (ref 70–99)
Potassium: 3.9 mmol/L (ref 3.5–5.2)
Sodium: 144 mmol/L (ref 134–144)
Total Protein: 6.8 g/dL (ref 6.0–8.5)
eGFR: 47 mL/min/{1.73_m2} — ABNORMAL LOW (ref >59)

## 2022-12-14 LAB — HEMOGLOBIN A1C
Est. average glucose Bld gHb Est-mCnc: 103 mg/dL
Hgb A1c MFr Bld: 5.2 % (ref 4.8–5.6)

## 2023-01-02 ENCOUNTER — Other Ambulatory Visit: Payer: Self-pay | Admitting: Adult Health

## 2023-01-16 ENCOUNTER — Telehealth: Payer: Self-pay | Admitting: Family Medicine

## 2023-01-16 NOTE — Telephone Encounter (Unsigned)
Copied from CRM [id number]. Topic: Referral - Status >> Jan 16, 2023  4:45 PM Macon Large wrote: Reason for CRM: Pt stated she has not heard from anyone regarding the referral to a dentist. Cb# [phone]

## 2023-01-17 NOTE — Telephone Encounter (Signed)
Sent patient a mychart message.

## 2023-02-01 ENCOUNTER — Telehealth: Payer: Self-pay | Admitting: Adult Health

## 2023-02-01 DIAGNOSIS — R569 Unspecified convulsions: Secondary | ICD-10-CM

## 2023-02-01 MED ORDER — LEVETIRACETAM 500 MG PO TABS: ORAL_TABLET | ORAL | 0 refills | Status: DC

## 2023-02-01 NOTE — Telephone Encounter (Signed)
Pt called stating that she is needing a fill on her  levETIRAcetam (KEPPRA) 500 MG tablet due to being low and being out of town next week. She would like the request sent in to the [facility] on [address]

## 2023-02-01 NOTE — Telephone Encounter (Signed)
Refill request sent to pharmacy this evening

## 2023-02-21 ENCOUNTER — Other Ambulatory Visit: Payer: Self-pay | Admitting: Family Medicine

## 2023-02-21 DIAGNOSIS — R600 Localized edema: Secondary | ICD-10-CM

## 2023-03-06 ENCOUNTER — Telehealth (INDEPENDENT_AMBULATORY_CARE_PROVIDER_SITE_OTHER): Payer: Medicaid Other | Admitting: Adult Health

## 2023-03-06 DIAGNOSIS — G43009 Migraine without aura, not intractable, without status migrainosus: Secondary | ICD-10-CM | POA: Diagnosis not present

## 2023-03-06 DIAGNOSIS — R569 Unspecified convulsions: Secondary | ICD-10-CM | POA: Diagnosis not present

## 2023-03-06 MED ORDER — NURTEC 75 MG PO TBDP: ORAL_TABLET | ORAL | 5 refills | Status: DC

## 2023-03-06 NOTE — Progress Notes (Signed)
PATIENT: [name] [name] DOB: 1960/10/01  REASON FOR VISIT: follow up HISTORY FROM: patient  Virtual Visit via Video Note  I connected with Maxwell Caul on 03/06/23 at  2:45 PM EDT by a video enabled telemedicine application located remotely at [facility] and verified that I am speaking with the correct person using two identifiers who was located at their own home.   I discussed the limitations of evaluation and management by telemedicine and the availability of in person appointments. The patient expressed understanding and agreed to proceed.   PATIENT: [name] [name] DOB: 18-Feb-1961  REASON FOR VISIT: follow up HISTORY FROM: patient Primary Neurologist:  Dr. Lucia Gaskins   HISTORY OF PRESENT ILLNESS: Today 03/06/23:  [name] [name] is a 62 y.o. female with a history of migraines and seizures. Returns today for follow-up. She continues on Topamax and Aimovig. Works well for her migraines.  Having about 2 headaches a week.  She takes maxalt but its been questionable whether she has PAD. She is now seeing neurosurgery for possible spinal issues that are causing leg symptoms. She is scheduled for CT and MRI in the coming weeks. Remains on Keppra- no seizures.   She states that she has been having some urinary issues.  States that her output does not match her intake.  Reports that she is seeing a physician at [facility] for this.  I do not see any recent blood work in the system.    08/22/22: [name] [name] is a 62 year old female with a history of migraine headaches and seizures.  She returns today for follow-up. Reports headaches are better.  Continues Topamax and Aimovig.  Denies any seizure events.  Remains on Keppra.  Being seen at [facility] for interstitial lung disease.  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  5/9/23Ms. [name] is a 62 year old female seizures and Migraines. She returns today for follow-up.   Reports that  headaches are better. Continue Aimovig monthly injection. Reports that she is able to take aleve and it helps her headaches. Reports that she has about two headaches a week. Continue on Topamax 100 mg daily. Concerned that maybe something is going on in her brain. Last imaging was done in 2015  No seizures- continue Keppra 500 mg twice a day. Also on lyrica- but not managed by our office.   HISTORY [name] [name] is a 62 year old right-handed [place] female with a history of lupus associated with interstitial lung disease.  The patient has seizures that have been well controlled over the years, and she has migraine headaches.  Her headaches have become more frequent recently, she is now having 3 to 4 a week.  She is on Topamax, she has been on gabapentin and Lyrica previously.  The patient takes Keppra as well for her seizures.  The patient gets a lot of photophobia and phonophobia with her headaches, nausea without vomiting.  She oftentimes has to lie down with the headache and cannot function.  She reports that she has had frequent urinary tract infections in the past, she has now on an antibiotic for this reason.  The patient comes back here for further evaluation.  REVIEW OF SYSTEMS: Out of a complete 14 system review of symptoms, the patient complains only of the following symptoms, and all other reviewed systems are negative.  ALLERGIES: Allergies  Allergen Reactions   Shellfish Allergy Anaphylaxis   Other     Blood Product Refusal  Pregabalin Swelling    Able to tolerate Lyrica (brand name only)   Gabapentin Rash    HOME MEDICATIONS: Outpatient Medications Prior to Visit  Medication Sig Dispense Refill   cefdinir (OMNICEF) 300 MG capsule Take 1 capsule (300 mg total) by mouth 2 (two) times daily. 20 capsule 0   Cyanocobalamin (B-12) 2000 MCG TABS Take 2,000 mcg by mouth daily. 30 tablet 3   Erenumab-aooe (AIMOVIG) 140 MG/ML SOAJ ADMINISTER 1 ML UNDER THE SKIN EVERY 30 DAYS 1 mL 4    etodolac (LODINE) 500 MG tablet Take 1 tablet (500 mg total) by mouth 2 (two) times daily. 30 tablet 0   fentaNYL (DURAGESIC - DOSED MCG/HR) 50 MCG/HR Place 1 patch (50 mcg total) onto the skin every 3 (three) days. 5 patch 0   fluconazole (DIFLUCAN) 150 MG tablet Take 1 tablet (150 mg total) by mouth daily. 7 tablet 0   folic acid (FOLVITE) 1 MG tablet Take 1 tablet (1 mg total) by mouth daily. 90 tablet 3   furosemide (LASIX) 20 MG tablet TAKE 1 TABLET BY MOUTH THREE DAYS A WEEK AS NEEDED FOR PEDAL EDEMA. DO NOT TAKE IF BLOOD PRESSURE IS LESS THAN 110/70 30 tablet 1   hydroxychloroquine (PLAQUENIL) 200 MG tablet Take 200-400 mg by mouth See admin instructions. Take 200 mg by mouth every other day alternating with 400 mg (2 tablets) every other day     levETIRAcetam (KEPPRA) 500 MG tablet TAKE 1 TABLET(500 MG) BY MOUTH TWICE DAILY 180 tablet 0   midodrine (PROAMATINE) 5 MG tablet Take 1 tablet (5 mg total) by mouth 3 (three) times daily with meals. 90 tablet 1   mycophenolate (MYFORTIC) 360 MG TBEC EC tablet Take 360-720 mg by mouth See admin instructions. Take 720mg  in the am and 360mg  in pm     Naloxone HCl 0.4 MG/0.4ML SOAJ Inject 0.4 mLs into the muscle as needed (overdose).     OXYGEN Inhale 2 L/min into the lungs at bedtime.     pantoprazole (PROTONIX) 40 MG tablet TAKE 1 TABLET(40 MG) BY MOUTH TWICE DAILY 180 tablet 1   polyethylene glycol (MIRALAX / GLYCOLAX) packet Take 17 g by mouth daily as needed for mild constipation or moderate constipation. 14 each 0   predniSONE (DELTASONE) 2.5 MG tablet Take 2.5 mg by mouth daily with breakfast.     pregabalin (LYRICA) 300 MG capsule Take 300 mg by mouth 2 (two) times daily.     ranitidine (ZANTAC) 150 MG capsule Take 150 mg by mouth 2 (two) times daily.     rizatriptan (MAXALT) 10 MG tablet Take 1 tab at the onset of migraine.May repeat in 2 hours if needed. Not to exceed 2tabs/24 hours 10 tablet 5   rosuvastatin (CRESTOR) 20 MG tablet Take 1  tablet (20 mg total) by mouth daily. 90 tablet 1   SYMBICORT 80-4.5 MCG/ACT inhaler Inhale 2 puffs into the lungs 2 (two) times daily.   1   topiramate (TOPAMAX) 100 MG tablet TAKE 1 TABLET(100 MG) BY MOUTH DAILY 90 tablet 0   traMADol (ULTRAM) 50 MG tablet Take 1-2 tablets (50-100 mg total) by mouth every 4 (four) hours as needed (cough). (Patient taking differently: Take 50-100 mg by mouth every 6 (six) hours as needed for moderate pain.) 84 tablet 0   Vitamin D, Ergocalciferol, (DRISDOL) 50000 units CAPS capsule Take 1 capsule (50,000 Units total) by mouth every 7 (seven) days. (Patient taking differently: Take 50,000 Units by mouth every Saturday.) 12  capsule 3   No facility-administered medications prior to visit.    PAST MEDICAL HISTORY: Past Medical History:  Diagnosis Date   Anemia    CHF (congestive heart failure) (HCC)    Chronic kidney disease    Chronic pain    COPD (chronic obstructive pulmonary disease) (HCC)    Fibromyalgia    Foley catheter in place    History of kidney stones    Migraine    Migraine without aura, with intractable migraine, so stated, without mention of status migrainosus 05/18/2014   Oxygen dependent    as needed 2L at hs per patient    Peripheral vascular disease (HCC)    Pneumonia    PONV (postoperative nausea and vomiting)    Pulmonary fibrosis (HCC)    Seizures (HCC)    SLE (systemic lupus erythematosus) (HCC) 12/12/2017   UTI (urinary tract infection) 09/30/2018    PAST SURGICAL HISTORY: Past Surgical History:  Procedure Laterality Date   ABDOMINAL HYSTERECTOMY     CHOLECYSTECTOMY     CYSTOSCOPY W/ URETERAL STENT PLACEMENT Left 10/03/2018   Procedure: CYSTOSCOPY WITH RETROGRADE PYELOGRAM/URETERAL STENT PLACEMENT;  Surgeon: Jerilee Field, MD;  Location: [facility];  Service: Urology;  Laterality: Left;   CYSTOSCOPY/URETEROSCOPY/HOLMIUM LASER/STENT PLACEMENT Left 11/05/2018   Procedure: CYSTOSCOPY/URETEROSCOPY/HOLMIUM LASER/STENT EXCHANGE;   Surgeon: Jerilee Field, MD;  Location: [facility];  Service: Urology;  Laterality: Left;  ONLY NEEDS 60 MIN   EYE SURGERY     bilateral cataract surgery    Kidney stome surgery  2012   Left lung biopsy  2015   LUNG BIOPSY Left 11/21/2013   Procedure: LUNG BIOPSY;  Surgeon: Delight Ovens, MD;  Location: [facility] OR;  Service: Thoracic;  Laterality: Left;   VIDEO ASSISTED THORACOSCOPY Left 11/21/2013   Procedure: VIDEO ASSISTED THORACOSCOPY;  Surgeon: Delight Ovens, MD;  Location: [facility] OR;  Service: Thoracic;  Laterality: Left;   VIDEO BRONCHOSCOPY N/A 11/21/2013   Procedure: VIDEO BRONCHOSCOPY;  Surgeon: Delight Ovens, MD;  Location: [facility] OR;  Service: Thoracic;  Laterality: N/A;    FAMILY HISTORY: Family History  Problem Relation Age of Onset   Hypertension Mother    Hepatitis C Mother    Diabetes Father    Hypertension Father    Multiple sclerosis Sister    Bipolar disorder Brother    Breast cancer Neg Hx     SOCIAL HISTORY: Social History   Socioeconomic History   Marital status: Single    Spouse name: Not on file   Number of children: 0   Years of education: college 3   Highest education level: Not on file  Occupational History   Occupation: unemployed  Tobacco Use   Smoking status: Former    Packs/day: 2.00    Years: 20.00    Additional pack years: 0.00    Total pack years: 40.00    Types: Cigarettes    Quit date: 04/17/2005    Years since quitting: 17.8   Smokeless tobacco: Never  Vaping Use   Vaping Use: Never used  Substance and Sexual Activity   Alcohol use: No   Drug use: No   Sexual activity: Never  Other Topics Concern   Not on file  Social History Narrative   Not on file   Social Determinants of Health   Financial Resource Strain: Not on file  Food Insecurity: Not on file  Transportation Needs: Not on file  Physical Activity: Not on file  Stress: Not on file  Social Connections: Not  on file  Intimate Partner Violence: Not on file       PHYSICAL EXAM Generalized: Well developed, in no acute distress   Neurological examination  Mentation: Alert oriented to time, place, history taking. Follows all commands speech and language fluent Cranial nerve II-XII: Facial symmetry noted  DIAGNOSTIC DATA (LABS, IMAGING, TESTING) - I reviewed patient records, labs, notes, testing and imaging myself where available.  Lab Results  Component Value Date   WBC 7.4 11/22/2021   HGB 14.3 11/22/2021   HCT 45.7 11/22/2021   MCV 85 11/22/2021   PLT 227 11/22/2021      Component Value Date/Time   NA 144 12/13/2022 1656   K 3.9 12/13/2022 1656   CL 111 (H) 12/13/2022 1656   CO2 16 (L) 12/13/2022 1656   GLUCOSE 101 (H) 12/13/2022 1656   GLUCOSE 81 11/04/2018 1148   BUN 12 12/13/2022 1656   CREATININE 1.30 (H) 12/13/2022 1656   CREATININE 1.27 (H) 09/27/2016 1006   CALCIUM 8.7 12/13/2022 1656   PROT 6.8 12/13/2022 1656   ALBUMIN 4.3 12/13/2022 1656   AST 15 12/13/2022 1656   ALT 5 12/13/2022 1656   ALKPHOS 298 (H) 12/13/2022 1656   BILITOT 0.3 12/13/2022 1656   GFRNONAA 62 06/14/2020 1349   GFRNONAA 48 (L) 09/27/2016 1006   GFRAA 72 06/14/2020 1349   GFRAA 55 (L) 09/27/2016 1006   Lab Results  Component Value Date   CHOL 250 (H) 11/22/2021   HDL 53 11/22/2021   LDLCALC 173 (H) 11/22/2021   TRIG 134 11/22/2021   CHOLHDL 3.5 08/08/2018   Lab Results  Component Value Date   HGBA1C 5.2 12/13/2022   Lab Results  Component Value Date   VITAMINB12 231 10/07/2013   Lab Results  Component Value Date   TSH 0.972 09/10/2014      ASSESSMENT AND PLAN 62 y.o. year old female  has a past medical history of Anemia, CHF (congestive heart failure) (HCC), Chronic kidney disease, Chronic pain, COPD (chronic obstructive pulmonary disease) (HCC), Fibromyalgia, Foley catheter in place, History of kidney stones, Migraine, Migraine without aura, with intractable migraine, so stated, without mention of status migrainosus  (05/18/2014), Oxygen dependent, Peripheral vascular disease (HCC), Pneumonia, PONV (postoperative nausea and vomiting), Pulmonary fibrosis (HCC), Seizures (HCC), SLE (systemic lupus erythematosus) (HCC) (12/12/2017), and UTI (urinary tract infection) (09/30/2018). here with:  Migraines Seizures  Continue Aimovig monthly injection Continue Topamax 100 mg daily Continue Keppra 500 mg BID.  Take Nurtec 75 mg at the onset of migraine. Only 1 tablet in 24 hours. Advised patient that when she sees neurosurgery to keep [place] updated on spinal issues.  Also advised to keep [place] updated on potential urinary issues. FU in 6 months or sooner if needed.     Butch Penny, MSN, NP-C 03/06/2023, 2:37 PM [facility] [address] [place], [place] [address] [phone]

## 2023-03-06 NOTE — Patient Instructions (Signed)
Continue Aimovig monthly injection Continue Topamax 100 mg daily Continue Keppra 500 mg twice a day Try taking Nurtec 75 mg at the onset of a migraine.  Only 1 tablet in 24 hours.  Please read over this medication before starting it Please keep [place] updated on any urinary issues that you get diagnosed with so we can adjust medications if necessary.

## 2023-03-16 ENCOUNTER — Other Ambulatory Visit (HOSPITAL_COMMUNITY): Payer: Self-pay

## 2023-03-20 ENCOUNTER — Telehealth: Payer: Self-pay

## 2023-03-20 ENCOUNTER — Other Ambulatory Visit: Payer: Self-pay | Admitting: Family Medicine

## 2023-03-20 ENCOUNTER — Other Ambulatory Visit (HOSPITAL_COMMUNITY): Payer: Self-pay

## 2023-03-20 NOTE — Telephone Encounter (Signed)
Patient Advocate Encounter   Received notification from [employer] that prior authorization is required for Nurtec 75MG  dispersible tablets   Submitted: n/a Key [id number]  Awaiting clinical questions to generate

## 2023-03-21 NOTE — Telephone Encounter (Signed)
Clinical questions have been submitted-awaiting determination. 

## 2023-03-30 ENCOUNTER — Other Ambulatory Visit (HOSPITAL_COMMUNITY): Payer: Self-pay

## 2023-03-30 NOTE — Telephone Encounter (Signed)
Pharmacy Patient Advocate Encounter   Received notification from  [employer]  that prior authorization for Nurtec 75MG  dispersible tablets is required/requested.   Insurance verification completed.   The patient is insured through  [employer]  .   PA started via CoverMyMeds. KEY [id number] . Waiting for clinical questions to populate.  Received a denial on the previous submission-I have resubmitted.  Clinical questions have now been submitted.

## 2023-04-02 ENCOUNTER — Other Ambulatory Visit (HOSPITAL_COMMUNITY): Payer: Self-pay

## 2023-04-02 NOTE — Telephone Encounter (Signed)
Pharmacy Patient Advocate Encounter  Received notification from  [employer]  that Prior Authorization for Nurtec 75MG  dispersible tablets has been DENIED because see below..    PA #/Case ID/Reference #: PA Case ID: [id number]    Full Denial letter will be uploaded to media tab. Contact for appeals are as follows: Phone: [phone], Fax: [phone]  The reasons above were never addressed in the clinical questions on CMM-I was never asked about these questions-I faxed appeal letter, appeal form, Clinical notes to [phone]. Awaiting decision.

## 2023-04-09 NOTE — Telephone Encounter (Signed)
We receive a fax from [facility] stating the appeal was received on 04/02/23. I faxed this to our PA team as fyi.

## 2023-04-13 ENCOUNTER — Other Ambulatory Visit: Payer: Self-pay | Admitting: Family Medicine

## 2023-04-13 ENCOUNTER — Other Ambulatory Visit: Payer: Self-pay | Admitting: Adult Health

## 2023-04-13 DIAGNOSIS — R569 Unspecified convulsions: Secondary | ICD-10-CM

## 2023-04-13 DIAGNOSIS — R600 Localized edema: Secondary | ICD-10-CM

## 2023-04-13 NOTE — Telephone Encounter (Signed)
Requested Prescriptions  Pending Prescriptions Disp Refills   furosemide (LASIX) 20 MG tablet [Pharmacy Med Name: FUROSEMIDE 20MG  TABLETS] 30 tablet 1    Sig: TAKE 1 TABLET BY MOUTH THREE DAYS A WEEK AS NEEDED FOR PEDAL EDEMA. DO NOT TAKE IF BLOOD PRESSURE IS LESS THAN 110/70     Cardiovascular:  Diuretics - Loop Failed - 04/13/2023  1:22 PM      Failed - Cr in normal range and within 180 days    Creat  Date Value Ref Range Status  09/27/2016 1.27 (H) 0.50 - 1.05 mg/dL Final    Comment:      For patients > or = 61 years of age: The upper reference limit for Creatinine is approximately 13% higher for people identified as African-American.      Creatinine, Ser  Date Value Ref Range Status  12/13/2022 1.30 (H) 0.57 - 1.00 mg/dL Final   Creatinine, Urine  Date Value Ref Range Status  10/26/2013 121.76 mg/dL Final         Failed - Cl in normal range and within 180 days    Chloride  Date Value Ref Range Status  12/13/2022 111 (H) 96 - 106 mmol/L Final         Failed - Mg Level in normal range and within 180 days    Magnesium  Date Value Ref Range Status  10/03/2018 2.6 (H) 1.7 - 2.4 mg/dL Final    Comment:    Performed at [facility], [address], [place], [place] [address]         Failed - Last BP in normal range    BP Readings from Last 1 Encounters:  12/13/22 (!) 80/60         Passed - K in normal range and within 180 days    Potassium  Date Value Ref Range Status  12/13/2022 3.9 3.5 - 5.2 mmol/L Final         Passed - Ca in normal range and within 180 days    Calcium  Date Value Ref Range Status  12/13/2022 8.7 8.7 - 10.3 mg/dL Final         Passed - Na in normal range and within 180 days    Sodium  Date Value Ref Range Status  12/13/2022 144 134 - 144 mmol/L Final         Passed - Valid encounter within last 6 months    Recent Outpatient Visits           4 months ago Pain, dental   [place] [facility] Hoy Register, MD   1 year ago Other insomnia   [facility] Hoy Register, MD   1 year ago Other systemic lupus erythematosus with other organ involvement [facility])   [place] [facility] Hoy Register, MD   1 year ago Annual physical exam   [place] [facility] Hoy Register, MD   2 years ago Acute cystitis without hematuria   [place] [facility] Hoy Register, MD       Future Appointments             In 5 months Butch Penny, NP [facility]

## 2023-04-23 NOTE — Telephone Encounter (Signed)
I called [facility]. Nurtec got filled on 04/16/23.

## 2023-07-18 ENCOUNTER — Other Ambulatory Visit: Payer: Self-pay | Admitting: Family Medicine

## 2023-07-18 DIAGNOSIS — R600 Localized edema: Secondary | ICD-10-CM

## 2023-08-14 ENCOUNTER — Other Ambulatory Visit: Payer: Self-pay | Admitting: Adult Health

## 2023-09-22 ENCOUNTER — Other Ambulatory Visit: Payer: Self-pay | Admitting: Adult Health

## 2023-09-30 NOTE — Progress Notes (Deleted)
 PATIENT: [name] [name] DOB: [birth year]  REASON FOR VISIT: follow up HISTORY FROM: patient  Virtual Visit via Video Note  I connected with [name] [name] on 09/30/23 at  3:00 PM EST by a video enabled telemedicine application located remotely at [facility] and verified that I am speaking with the correct person using two identifiers who was located at their own home.   I discussed the limitations of evaluation and management by telemedicine and the availability of in person appointments. The patient expressed understanding and agreed to proceed.   PATIENT: [name] [name] DOB: 05-Jul-1961  REASON FOR VISIT: follow up HISTORY FROM: patient Primary Neurologist:  Dr. Ines   HISTORY OF PRESENT ILLNESS: Today 09/30/23:  [name] [name] is a 63 y.o. female with a history of Migraines and seizures . Returns today for follow-up.      03/06/23: [name] [name] is a 63 y.o. female with a history of migraines and seizures. Returns today for follow-up. She continues on Topamax  and Aimovig . Works well for her migraines.  Having about 2 headaches a week.  She takes maxalt  but its been questionable whether she has PAD. She is now seeing neurosurgery for possible spinal issues that are causing leg symptoms. She is scheduled for CT and MRI in the coming weeks. Remains on Keppra - no seizures.   She states that she has been having some urinary issues.  States that her output does not match her intake.  Reports that she is seeing a physician at [facility] for this.  I do not see any recent blood work in the system.    08/22/22: [name] [name] is a 63 year old female with a history of migraine headaches and seizures.  She returns today for follow-up. Reports headaches are better.  Continues Topamax  and Aimovig .  Denies any seizure events.  Remains on Keppra .  Being seen at [facility] for interstitial lung disease.  Was seen by vascular surgeon in December for peripheral artery disease and iliac artery  occlusion.  5/9/23Ms. [name] is a 63 year old female seizures and Migraines. She returns today for follow-up.   Reports that headaches are better. Continue Aimovig  monthly injection. Reports that she is able to take aleve and it helps her headaches. Reports that she has about two headaches a week. Continue on Topamax  100 mg daily. Concerned that maybe something is going on in her brain. Last imaging was done in 2015  No seizures- continue Keppra  500 mg twice a day. Also on lyrica - but not managed by our office.   HISTORY [name] [name] is a 63 year old right-handed [place] female with a history of lupus associated with interstitial lung disease.  The patient has seizures that have been well controlled over the years, and she has migraine headaches.  Her headaches have become more frequent recently, she is now having 3 to 4 a week.  She is on Topamax , she has been on gabapentin  and Lyrica  previously.  The patient takes Keppra  as well for her seizures.  The patient gets a lot of photophobia and phonophobia with her headaches, nausea without vomiting.  She oftentimes has to lie down with the headache and cannot function.  She reports that she has had frequent urinary tract infections in the past, she has now on an antibiotic for this reason.  The patient comes back here for further evaluation.  REVIEW OF SYSTEMS: Out of a complete 14 system review of symptoms, the patient complains only of the following symptoms, and all other reviewed systems  are negative.  ALLERGIES: Allergies  Allergen Reactions   Shellfish Allergy Anaphylaxis   Other     Blood Product Refusal    Pregabalin  Swelling    Able to tolerate Lyrica  (brand name only)   Gabapentin  Rash    HOME MEDICATIONS: Outpatient Medications Prior to Visit  Medication Sig Dispense Refill   AIMOVIG  140 MG/ML SOAJ ADMINISTER 1 ML UNDER THE SKIN EVERY 30 DAYS 1 mL 4   cefdinir  (OMNICEF ) 300 MG capsule Take 1 capsule (300 mg total) by mouth 2 (two)  times daily. 20 capsule 0   Cyanocobalamin  (B-12) 2000 MCG TABS Take 2,000 mcg by mouth daily. 30 tablet 3   etodolac  (LODINE ) 500 MG tablet Take 1 tablet (500 mg total) by mouth 2 (two) times daily. 30 tablet 0   fentaNYL  (DURAGESIC  - DOSED MCG/HR) 50 MCG/HR Place 1 patch (50 mcg total) onto the skin every 3 (three) days. 5 patch 0   fluconazole  (DIFLUCAN ) 150 MG tablet Take 1 tablet (150 mg total) by mouth daily. 7 tablet 0   folic acid  (FOLVITE ) 1 MG tablet Take 1 tablet (1 mg total) by mouth daily. 90 tablet 3   furosemide  (LASIX ) 20 MG tablet TAKE 1 TABLET BY MOUTH THREE DAYS A WEEK AS NEEDED FOR PEDAL EDEMA. DO NOT TAKE IF BLOOD PRESSURE IS LESS THAN 110/70 12 tablet 0   hydroxychloroquine  (PLAQUENIL ) 200 MG tablet Take 200-400 mg by mouth See admin instructions. Take 200 mg by mouth every other day alternating with 400 mg (2 tablets) every other day     levETIRAcetam  (KEPPRA ) 500 MG tablet TAKE 1 TABLET(500 MG) BY MOUTH TWICE DAILY 180 tablet 2   midodrine  (PROAMATINE ) 5 MG tablet Take 1 tablet (5 mg total) by mouth 3 (three) times daily with meals. 90 tablet 1   mycophenolate  (MYFORTIC ) 360 MG TBEC EC tablet Take 360-720 mg by mouth See admin instructions. Take 720mg  in the am and 360mg  in pm     Naloxone  HCl 0.4 MG/0.4ML SOAJ Inject 0.4 mLs into the muscle as needed (overdose).     OXYGEN Inhale 2 L/min into the lungs at bedtime.     pantoprazole  (PROTONIX ) 40 MG tablet TAKE 1 TABLET(40 MG) BY MOUTH TWICE DAILY 180 tablet 1   polyethylene glycol (MIRALAX  / GLYCOLAX ) packet Take 17 g by mouth daily as needed for mild constipation or moderate constipation. 14 each 0   predniSONE  (DELTASONE ) 2.5 MG tablet Take 2.5 mg by mouth daily with breakfast.     pregabalin  (LYRICA ) 300 MG capsule Take 300 mg by mouth 2 (two) times daily.     ranitidine (ZANTAC) 150 MG capsule Take 150 mg by mouth 2 (two) times daily.     Rimegepant Sulfate (NURTEC) 75 MG TBDP Take 1 tablet at the onset of migraine. Only  1 tablet in 24 hours. 15 tablet 5   rosuvastatin  (CRESTOR ) 20 MG tablet TAKE 1 TABLET(20 MG) BY MOUTH DAILY 90 tablet 1   SYMBICORT 80-4.5 MCG/ACT inhaler Inhale 2 puffs into the lungs 2 (two) times daily.   1   topiramate  (TOPAMAX ) 100 MG tablet TAKE 1 TABLET(100 MG) BY MOUTH DAILY 90 tablet 0   traMADol  (ULTRAM ) 50 MG tablet Take 1-2 tablets (50-100 mg total) by mouth every 4 (four) hours as needed (cough). (Patient taking differently: Take 50-100 mg by mouth every 6 (six) hours as needed for moderate pain.) 84 tablet 0   Vitamin D , Ergocalciferol , (DRISDOL ) 50000 units CAPS capsule Take 1 capsule (50,000  Units total) by mouth every 7 (seven) days. (Patient taking differently: Take 50,000 Units by mouth every Saturday.) 12 capsule 3   No facility-administered medications prior to visit.    PAST MEDICAL HISTORY: Past Medical History:  Diagnosis Date   Anemia    CHF (congestive heart failure) (HCC)    Chronic kidney disease    Chronic pain    COPD (chronic obstructive pulmonary disease) (HCC)    Fibromyalgia    Foley catheter in place    History of kidney stones    Migraine    Migraine without aura, with intractable migraine, so stated, without mention of status migrainosus 05/18/2014   Oxygen dependent    as needed 2L at hs per patient    Peripheral vascular disease (HCC)    Pneumonia    PONV (postoperative nausea and vomiting)    Pulmonary fibrosis (HCC)    Seizures (HCC)    SLE (systemic lupus erythematosus) (HCC) 12/12/2017   UTI (urinary tract infection) 09/30/2018    PAST SURGICAL HISTORY: Past Surgical History:  Procedure Laterality Date   ABDOMINAL HYSTERECTOMY     CHOLECYSTECTOMY     CYSTOSCOPY W/ URETERAL STENT PLACEMENT Left 10/03/2018   Procedure: CYSTOSCOPY WITH RETROGRADE PYELOGRAM/URETERAL STENT PLACEMENT;  Surgeon: Kynzlee Cough, MD;  Location: [facility];  Service: Urology;  Laterality: Left;   CYSTOSCOPY/URETEROSCOPY/HOLMIUM LASER/STENT PLACEMENT Left 11/05/2018    Procedure: CYSTOSCOPY/URETEROSCOPY/HOLMIUM LASER/STENT EXCHANGE;  Surgeon: Aylssa Cough, MD;  Location: [facility];  Service: Urology;  Laterality: Left;  ONLY NEEDS 60 MIN   EYE SURGERY     bilateral cataract surgery    Kidney stome surgery  2012   Left lung biopsy  2015   LUNG BIOPSY Left 11/21/2013   Procedure: LUNG BIOPSY;  Surgeon: Dallas KATHEE Jude, MD;  Location: [facility] OR;  Service: Thoracic;  Laterality: Left;   VIDEO ASSISTED THORACOSCOPY Left 11/21/2013   Procedure: VIDEO ASSISTED THORACOSCOPY;  Surgeon: Dallas KATHEE Jude, MD;  Location: [facility] OR;  Service: Thoracic;  Laterality: Left;   VIDEO BRONCHOSCOPY N/A 11/21/2013   Procedure: VIDEO BRONCHOSCOPY;  Surgeon: Dallas KATHEE Jude, MD;  Location: [facility] OR;  Service: Thoracic;  Laterality: N/A;    FAMILY HISTORY: Family History  Problem Relation Age of Onset   Hypertension Mother    Hepatitis C Mother    Diabetes Father    Hypertension Father    Multiple sclerosis Sister    Bipolar disorder Brother    Breast cancer Neg Hx     SOCIAL HISTORY: Social History   Socioeconomic History   Marital status: Single    Spouse name: Not on file   Number of children: 0   Years of education: college 3   Highest education level: Not on file  Occupational History   Occupation: unemployed  Tobacco Use   Smoking status: Former    Current packs/day: 0.00    Average packs/day: 2.0 packs/day for 20.0 years (40.0 ttl pk-yrs)    Types: Cigarettes    Start date: 04/17/1985    Quit date: 04/17/2005    Years since quitting: 18.4   Smokeless tobacco: Never  Vaping Use   Vaping status: Never Used  Substance and Sexual Activity   Alcohol use: No   Drug use: No   Sexual activity: Never  Other Topics Concern   Not on file  Social History Narrative   Not on file   Social Drivers of Health   Financial Resource Strain: Medium Risk (02/04/2023)   Received from [facility], [facility]   Overall Financial  Resource Strain (CARDIA)    Difficulty of Paying Living Expenses: Somewhat hard  Food Insecurity: No Food Insecurity (02/04/2023)   Received from [facility], [facility]   Hunger Vital Sign    Worried About Running Out of Food in the Last Year: Never true    Ran Out of Food in the Last Year: Never true  Transportation Needs: No Transportation Needs (02/04/2023)   Received from [facility], [facility]   [facility] - Transportation    In the past 12 months, has lack of transportation kept you from medical appointments or from getting medications?: No    Lack of Transportation (Non-Medical): No  Physical Activity: Not on file  Stress: Not on file  Social Connections: Not on file  Intimate Partner Violence: Not on file      PHYSICAL EXAM Generalized: Well developed, in no acute distress   Neurological examination  Mentation: Alert oriented to time, place, history taking. Follows all commands speech and language fluent Cranial nerve II-XII: Facial symmetry noted  DIAGNOSTIC DATA (LABS, IMAGING, TESTING) - I reviewed patient records, labs, notes, testing and imaging myself where available.  Lab Results  Component Value Date   WBC 7.4 11/22/2021   HGB 14.3 11/22/2021   HCT 45.7 11/22/2021   MCV 85 11/22/2021   PLT 227 11/22/2021      Component Value Date/Time   NA 144 12/13/2022 1656   K 3.9 12/13/2022 1656   CL 111 (H) 12/13/2022 1656   CO2 16 (L) 12/13/2022 1656   GLUCOSE 101 (H) 12/13/2022 1656   GLUCOSE 81 11/04/2018 1148   BUN 12 12/13/2022 1656   CREATININE 1.30 (H) 12/13/2022 1656   CREATININE 1.27 (H) 09/27/2016 1006   CALCIUM  8.7 12/13/2022 1656   PROT 6.8 12/13/2022 1656   ALBUMIN 4.3 12/13/2022 1656   AST 15 12/13/2022 1656   ALT 5 12/13/2022 1656   ALKPHOS 298 (H) 12/13/2022 1656   BILITOT 0.3 12/13/2022 1656   GFRNONAA 62 06/14/2020 1349   GFRNONAA 48 (L) 09/27/2016 1006   GFRAA 72  06/14/2020 1349   GFRAA 55 (L) 09/27/2016 1006   Lab Results  Component Value Date   CHOL 250 (H) 11/22/2021   HDL 53 11/22/2021   LDLCALC 173 (H) 11/22/2021   TRIG 134 11/22/2021   CHOLHDL 3.5 08/08/2018   Lab Results  Component Value Date   HGBA1C 5.2 12/13/2022   Lab Results  Component Value Date   VITAMINB12 231 10/07/2013   Lab Results  Component Value Date   TSH 0.972 09/10/2014      ASSESSMENT AND PLAN 63 y.o. year old female  has a past medical history of Anemia, CHF (congestive heart failure) (HCC), Chronic kidney disease, Chronic pain, COPD (chronic obstructive pulmonary disease) (HCC), Fibromyalgia, Foley catheter in place, History of kidney stones, Migraine, Migraine without aura, with intractable migraine, so stated, without mention of status migrainosus (05/18/2014), Oxygen dependent, Peripheral vascular disease (HCC), Pneumonia, PONV (postoperative nausea and vomiting), Pulmonary fibrosis (HCC), Seizures (HCC), SLE (systemic lupus erythematosus) (HCC) (12/12/2017), and UTI (urinary tract infection) (09/30/2018). here with:  Migraines Seizures  Continue Aimovig  monthly injection Continue Topamax  100 mg daily Continue Keppra  500 mg BID.  Take Nurtec 75 mg at the onset of migraine. Only 1 tablet in 24 hours. Advised patient that when she sees neurosurgery to keep us  updated on spinal issues.  Also advised to keep  us  updated on potential urinary issues. FU in 6 months or sooner if needed.     Duwaine Russell, MSN, NP-C 09/30/2023, 9:06 AM [facility] [address], [address] [place], [place] [address] [phone]

## 2023-10-02 ENCOUNTER — Telehealth: Payer: MEDICAID | Admitting: Adult Health

## 2023-11-12 ENCOUNTER — Telehealth: Payer: Self-pay

## 2023-11-12 NOTE — Telephone Encounter (Signed)
 Patient has been called and scheduled    Copied from CRM [id number]. Topic: Referral - Request for Referral >> Nov 12, 2023  2:17 PM Desma Mcgregor wrote: Did the patient discuss referral with their provider in the last year? No (If No - schedule appointment) (If Yes - send message)  Appointment offered? Yes  Type of order/referral and detailed reason for visit: Otolaryngologist due to the MRI results from her neck.   Preference of office, provider, location: No preference  If referral order, have you been seen by this specialty before? No (If Yes, this issue or another issue? When? Where?  Can we respond through MyChart? Yes >> Nov 12, 2023  2:35 PM Desma Mcgregor wrote: Also asking for a referral for a Endocrinologist

## 2023-11-19 ENCOUNTER — Other Ambulatory Visit: Payer: Self-pay | Admitting: Family Medicine

## 2023-11-19 DIAGNOSIS — K2100 Gastro-esophageal reflux disease with esophagitis, without bleeding: Secondary | ICD-10-CM

## 2023-12-12 ENCOUNTER — Ambulatory Visit: Payer: MEDICAID | Admitting: Family Medicine

## 2023-12-15 ENCOUNTER — Other Ambulatory Visit: Payer: Self-pay | Admitting: Family Medicine

## 2023-12-15 DIAGNOSIS — K2100 Gastro-esophageal reflux disease with esophagitis, without bleeding: Secondary | ICD-10-CM

## 2023-12-31 ENCOUNTER — Telehealth: Payer: Self-pay | Admitting: Adult Health

## 2023-12-31 MED ORDER — TOPIRAMATE 100 MG PO TABS: 100.0000 mg | ORAL_TABLET | Freq: Every day | ORAL | 0 refills | Status: DC

## 2023-12-31 MED ORDER — NURTEC 75 MG PO TBDP: ORAL_TABLET | ORAL | 1 refills | Status: DC

## 2023-12-31 NOTE — Telephone Encounter (Signed)
 Pt request refill for topiramate (TOPAMAX) 100 MG tablet and  Rimegepant Sulfate (NURTEC) 75 MG TBDP [id number]   Have scheduled an appointment on MyChart Video Visit 03/06/24 at 9:15 am

## 2023-12-31 NOTE — Telephone Encounter (Signed)
 Refills sent  Last topamax refill:  Last Nurtec refill:

## 2024-01-11 ENCOUNTER — Telehealth: Payer: Self-pay

## 2024-01-11 ENCOUNTER — Other Ambulatory Visit: Payer: Self-pay | Admitting: Adult Health

## 2024-01-11 ENCOUNTER — Other Ambulatory Visit (HOSPITAL_COMMUNITY): Payer: Self-pay

## 2024-01-11 DIAGNOSIS — R569 Unspecified convulsions: Secondary | ICD-10-CM

## 2024-01-11 NOTE — Telephone Encounter (Signed)
 Pharmacy Patient Advocate Encounter   Received notification from CoverMyMeds that prior authorization for Aimovig  140MG /ML auto-injectors is required/requested.   Insurance verification completed.   The patient is insured through [place] .   Per test claim: PA required; PA submitted to above mentioned insurance via CoverMyMeds Key/confirmation #/EOC [id number] Status is pending

## 2024-01-11 NOTE — Telephone Encounter (Signed)
 Pharmacy Patient Advocate Encounter  Received notification from [facility] that Prior Authorization for Aimovig  140MG /ML auto-injectors has been APPROVED from 01/11/2024 to 01/10/2025. Ran test claim, Copay is $4.00. This test claim was processed through [facility]- copay amounts may vary at other pharmacies due to pharmacy/plan contracts, or as the patient moves through the different stages of their insurance plan.   PA #/Case ID/Reference #: PA Case ID #: [id number]

## 2024-01-14 NOTE — Telephone Encounter (Signed)
 Next appt 03/06/24 Last fills:   One 90-day supply Rx sent to pharmacy.

## 2024-01-18 ENCOUNTER — Other Ambulatory Visit: Payer: Self-pay | Admitting: Family Medicine

## 2024-01-18 DIAGNOSIS — K2100 Gastro-esophageal reflux disease with esophagitis, without bleeding: Secondary | ICD-10-CM

## 2024-01-29 ENCOUNTER — Telehealth (HOSPITAL_BASED_OUTPATIENT_CLINIC_OR_DEPARTMENT_OTHER): Payer: MEDICAID | Admitting: Family Medicine

## 2024-01-29 ENCOUNTER — Encounter: Payer: Self-pay | Admitting: Family Medicine

## 2024-01-29 DIAGNOSIS — E041 Nontoxic single thyroid nodule: Secondary | ICD-10-CM

## 2024-01-29 DIAGNOSIS — R591 Generalized enlarged lymph nodes: Secondary | ICD-10-CM

## 2024-01-29 NOTE — Progress Notes (Signed)
 Virtual Visit via Video Note  I connected with [name] [name], on 01/29/2024 at 8:33 AM by video enabled telemedicine device and verified that I am speaking with the correct person using two identifiers.   Consent: I discussed the limitations, risks, security and privacy concerns of performing an evaluation and management service by telemedicine and the availability of in person appointments. I also discussed with the patient that there may be a patient responsible charge related to this service. The patient expressed understanding and agreed to proceed.   Location of Patient: Home  Location of Provider: Clinic   Persons participating in Telemedicine visit: Alexiea Rossetti Dr. Adan Holms    Discussed the use of AI scribe software for clinical note transcription with the patient, who gave verbal consent to proceed.  History of Present Illness [name] [name] is a 63 year old female with a history of Fibromyalgia,systemic lupus erythematosus with interstitial lung disease,  chronic respiratory failure (on 2L at night), chronic low back pain (followed by pain management), GERD, seizures, migraines who , history of cystoscopy, left ureteroscopy with laser lithotripsy and stent exchange, peripheral arterial disease, s/pTAH in the 90s (secondary to ?[place] who presents with thyroid  nodules and lymphadenopathy.  She experiences neck pain where nodules are present bilaterally. An ultrasound in November showed abnormal lymph nodes and thyroid  nodules. She endorses presence of hoarseness.  Her doctors at [facility] would like her to to be referred to an ENT but she would like one who is local in [place].     Ultrasound Head and Neck from [facility] 07/2023: Impression:   1. Abnormal appearance of bilateral neck lymph nodes described above.  Consider fine-needle aspiration.   2. Three TR-3/4 thyroid  nodules are present none of which meet criteria for  fine-needle aspiration, however,  nodule 2 warrants follow-up in 1 year.     She is also requesting endocrinology referral as recommended by one of her specialist at [facility] but she is uncertain about the need for an endocrinologist referral  and plans to seek clarification.   Past Medical History:  Diagnosis Date   Anemia    CHF (congestive heart failure) (HCC)    Chronic kidney disease    Chronic pain    COPD (chronic obstructive pulmonary disease) (HCC)    Fibromyalgia    Foley catheter in place    History of kidney stones    Migraine    Migraine without aura, with intractable migraine, so stated, without mention of status migrainosus 05/18/2014   Oxygen dependent    as needed 2L at hs per patient    Peripheral vascular disease (HCC)    Pneumonia    PONV (postoperative nausea and vomiting)    Pulmonary fibrosis (HCC)    Seizures (HCC)    SLE (systemic lupus erythematosus) (HCC) 12/12/2017   UTI (urinary tract infection) 09/30/2018   Allergies  Allergen Reactions   Shellfish Allergy Anaphylaxis   Other     Blood Product Refusal    Pregabalin  Swelling    Able to tolerate Lyrica  (brand name only)   Gabapentin  Rash    Current Outpatient Medications on File Prior to Visit  Medication Sig Dispense Refill   AIMOVIG  140 MG/ML SOAJ ADMINISTER 1 ML UNDER THE SKIN EVERY 30 DAYS 1 mL 4   cefdinir  (OMNICEF ) 300 MG capsule Take 1 capsule (300 mg total) by mouth 2 (two) times daily. 20 capsule 0   Cyanocobalamin  (B-12) 2000 MCG TABS Take 2,000 mcg by mouth daily.  30 tablet 3   etodolac  (LODINE ) 500 MG tablet Take 1 tablet (500 mg total) by mouth 2 (two) times daily. 30 tablet 0   fentaNYL  (DURAGESIC  - DOSED MCG/HR) 50 MCG/HR Place 1 patch (50 mcg total) onto the skin every 3 (three) days. 5 patch 0   fluconazole  (DIFLUCAN ) 150 MG tablet Take 1 tablet (150 mg total) by mouth daily. 7 tablet 0   folic acid  (FOLVITE ) 1 MG tablet Take 1 tablet (1 mg total) by mouth daily. 90 tablet 3   furosemide  (LASIX ) 20 MG tablet TAKE 1  TABLET BY MOUTH THREE DAYS A WEEK AS NEEDED FOR PEDAL EDEMA. DO NOT TAKE IF BLOOD PRESSURE IS LESS THAN 110/70 12 tablet 0   hydroxychloroquine  (PLAQUENIL ) 200 MG tablet Take 200-400 mg by mouth See admin instructions. Take 200 mg by mouth every other day alternating with 400 mg (2 tablets) every other day     levETIRAcetam  (KEPPRA ) 500 MG tablet TAKE 1 TABLET(500 MG) BY MOUTH TWICE DAILY 180 tablet 0   midodrine  (PROAMATINE ) 5 MG tablet Take 1 tablet (5 mg total) by mouth 3 (three) times daily with meals. 90 tablet 1   mycophenolate  (MYFORTIC ) 360 MG TBEC EC tablet Take 360-720 mg by mouth See admin instructions. Take 720mg  in the am and 360mg  in pm     Naloxone  HCl 0.4 MG/0.4ML SOAJ Inject 0.4 mLs into the muscle as needed (overdose).     OXYGEN Inhale 2 L/min into the lungs at bedtime.     pantoprazole  (PROTONIX ) 40 MG tablet TAKE 1 TABLET(40 MG) BY MOUTH TWICE DAILY. NEED APPOINTMENT FOR REFILLS 60 tablet 0   polyethylene glycol (MIRALAX  / GLYCOLAX ) packet Take 17 g by mouth daily as needed for mild constipation or moderate constipation. 14 each 0   predniSONE  (DELTASONE ) 2.5 MG tablet Take 2.5 mg by mouth daily with breakfast.     pregabalin  (LYRICA ) 300 MG capsule Take 300 mg by mouth 2 (two) times daily.     ranitidine (ZANTAC) 150 MG capsule Take 150 mg by mouth 2 (two) times daily.     Rimegepant Sulfate (NURTEC) 75 MG TBDP Take 1 tablet at the onset of migraine. Only 1 tablet in 24 hours. 15 tablet 1   rosuvastatin  (CRESTOR ) 20 MG tablet TAKE 1 TABLET(20 MG) BY MOUTH DAILY 90 tablet 1   SYMBICORT 80-4.5 MCG/ACT inhaler Inhale 2 puffs into the lungs 2 (two) times daily.   1   topiramate  (TOPAMAX ) 100 MG tablet Take 1 tablet (100 mg total) by mouth daily. 90 tablet 0   traMADol  (ULTRAM ) 50 MG tablet Take 1-2 tablets (50-100 mg total) by mouth every 4 (four) hours as needed (cough). (Patient taking differently: Take 50-100 mg by mouth every 6 (six) hours as needed for moderate pain.) 84 tablet  0   Vitamin D , Ergocalciferol , (DRISDOL ) 50000 units CAPS capsule Take 1 capsule (50,000 Units total) by mouth every 7 (seven) days. (Patient taking differently: Take 50,000 Units by mouth every Saturday.) 12 capsule 3   No current facility-administered medications on file prior to visit.    ROS: See HPI  Observations/Objective: Awake, alert, oriented x3 Not in acute distress Normal mood      Latest Ref Rng & Units 12/13/2022    4:56 PM 11/22/2021   11:17 AM 06/14/2020    1:49 PM  CMP  Glucose 70 - 99 mg/dL 161  93  81   BUN 8 - 27 mg/dL 12  11  7  Creatinine 0.57 - 1.00 mg/dL 1.61  0.96  0.45   Sodium 134 - 144 mmol/L 144  143  141   Potassium 3.5 - 5.2 mmol/L 3.9  3.8  3.6   Chloride 96 - 106 mmol/L 111  105  101   CO2 20 - 29 mmol/L 16  20  24    Calcium  8.7 - 10.3 mg/dL 8.7  8.9  9.1   Total Protein 6.0 - 8.5 g/dL 6.8  6.9  6.9   Total Bilirubin 0.0 - 1.2 mg/dL 0.3  0.4  0.6   Alkaline Phos 44 - 121 IU/L 298  272  173   AST 0 - 40 IU/L 15  17  13    ALT 0 - 32 IU/L 5  9  5      Lipid Panel     Component Value Date/Time   CHOL 250 (H) 11/22/2021 1117   TRIG 134 11/22/2021 1117   HDL 53 11/22/2021 1117   CHOLHDL 3.5 08/08/2018 1152   CHOLHDL 5.3 09/10/2014 1142   VLDL 36 09/10/2014 1142   LDLCALC 173 (H) 11/22/2021 1117   LABVLDL 24 11/22/2021 1117    Lab Results  Component Value Date   HGBA1C 5.2 12/13/2022      Assessment & Plan Thyroid  nodule and lymphadenopathy Thyroid  nodules and lymphadenopathy on ultrasound suggest malignancy. Urgent ENT evaluation required. - Urgent referral to local otolaryngologist for evaluation. - Instructed her that the otolaryngologist's office will contact her to schedule. - Advised to monitor for worsening symptoms such as increased hoarseness or weight loss.      No orders of the defined types were placed in this encounter.   Follow Up Instructions: Keep previously scheduled appt   I discussed the assessment and  treatment plan with the patient. The patient was provided an opportunity to ask questions and all were answered. The patient agreed with the plan and demonstrated an understanding of the instructions.   The patient was advised to call back or seek an in-person evaluation if the symptoms worsen or if the condition fails to improve as anticipated.     I provided 16 minutes total of Telehealth time during this encounter including median intraservice time, reviewing previous notes, investigations, ordering medications, medical decision making, coordinating care and patient verbalized understanding at the end of the visit.     Joaquin Mulberry, MD, FAAFP. [facility] [place], [place] [phone]   01/29/2024, 8:33 AM

## 2024-02-15 ENCOUNTER — Encounter (INDEPENDENT_AMBULATORY_CARE_PROVIDER_SITE_OTHER): Payer: Self-pay | Admitting: Otolaryngology

## 2024-02-16 ENCOUNTER — Other Ambulatory Visit: Payer: Self-pay | Admitting: Family Medicine

## 2024-02-16 DIAGNOSIS — K2100 Gastro-esophageal reflux disease with esophagitis, without bleeding: Secondary | ICD-10-CM

## 2024-02-23 ENCOUNTER — Other Ambulatory Visit: Payer: Self-pay | Admitting: Family Medicine

## 2024-02-23 DIAGNOSIS — K2100 Gastro-esophageal reflux disease with esophagitis, without bleeding: Secondary | ICD-10-CM

## 2024-03-06 ENCOUNTER — Telehealth: Payer: MEDICAID | Admitting: Adult Health

## 2024-03-06 DIAGNOSIS — R569 Unspecified convulsions: Secondary | ICD-10-CM | POA: Diagnosis not present

## 2024-03-06 DIAGNOSIS — G43009 Migraine without aura, not intractable, without status migrainosus: Secondary | ICD-10-CM

## 2024-03-06 MED ORDER — NURTEC 75 MG PO TBDP: ORAL_TABLET | ORAL | 11 refills | Status: AC

## 2024-03-06 MED ORDER — LEVETIRACETAM 500 MG PO TABS: ORAL_TABLET | ORAL | 3 refills | Status: AC

## 2024-03-06 MED ORDER — TOPIRAMATE 100 MG PO TABS: 100.0000 mg | ORAL_TABLET | Freq: Every day | ORAL | 3 refills | Status: AC

## 2024-03-06 MED ORDER — AIMOVIG 140 MG/ML ~~LOC~~ SOAJ: SUBCUTANEOUS | 11 refills | Status: AC

## 2024-03-06 NOTE — Patient Instructions (Signed)
 Continue Aimovig  monthly injection Continue Topamax  100 mg daily Continue Keppra  500 mg BID.  Take Nurtec 75 mg at the onset of migraine. Only 1 tablet in 24 hours.

## 2024-03-06 NOTE — Progress Notes (Signed)
 PATIENT: [name] [name] DOB: April 06, 1961  REASON FOR VISIT: follow up HISTORY FROM: patient  Virtual Visit via Video Note  I connected with [name] [name] on 03/06/24 at  9:15 AM EDT by a video enabled telemedicine application located remotely at [name] and verified that I am speaking with the correct person using two identifiers who was located at their own home.   I discussed the limitations of evaluation and management by telemedicine and the availability of in person appointments. The patient expressed understanding and agreed to proceed.   PATIENT: [name] [name] DOB: 01/27/1961  REASON FOR VISIT: follow up HISTORY FROM: patient Primary Neurologist:  Dr. Tresia Fruit   HISTORY OF PRESENT ILLNESS: Today 03/06/24:  [name] [name] is a 63 y.o. female with a history of migraines and seizures. Returns today for follow-up. Overall she has been doing well. She has been on Topamax  and Aimovig . However she has not been able to get aimovig  injection for the last two months. Overall she feels the combination works well for her. She denies any seizure events.  Remains on Keppra .  She does report that she has been diagnosed with thyroid  nodules with enlarged lymph nodes.  She was advised to see ENT for biopsy.   03/06/23: [name] [name] is a 63 y.o. female with a history of migraines and seizures. Returns today for follow-up. She continues on Topamax  and Aimovig . Works well for her migraines.  Having about 2 headaches a week.  She takes maxalt  but its been questionable whether she has PAD. She is now seeing neurosurgery for possible spinal issues that are causing leg symptoms. She is scheduled for CT and MRI in the coming weeks. Remains on Keppra - no seizures.   She states that she has been having some urinary issues.  States that her output does not match her intake.  Reports that she is seeing a physician at [facility] for this.  I do not see any recent blood work in the  system.    08/22/22: [name] [name] is a 63 year old female with a history of migraine headaches and seizures.  She returns today for follow-up. Reports headaches are better.  Continues Topamax  and Aimovig .  Denies any seizure events.  Remains on Keppra .  Being seen at [facility] for interstitial lung disease.  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  5/9/23Ms. [name] is a 63 year old female seizures and Migraines. She returns today for follow-up.   Reports that headaches are better. Continue Aimovig  monthly injection. Reports that she is able to take aleve and it helps her headaches. Reports that she has about two headaches a week. Continue on Topamax  100 mg daily. Concerned that maybe something is going on in her brain. Last imaging was done in 2015  No seizures- continue Keppra  500 mg twice a day. Also on lyrica - but not managed by our office.   HISTORY [name] [name] is a 63 year old right-handed [place] female with a history of lupus associated with interstitial lung disease.  The patient has seizures that have been well controlled over the years, and she has migraine headaches.  Her headaches have become more frequent recently, she is now having 3 to 4 a week.  She is on Topamax , she has been on gabapentin  and Lyrica  previously.  The patient takes Keppra  as well for her seizures.  The patient gets a lot of photophobia and phonophobia with her headaches, nausea without vomiting.  She oftentimes has  to lie down with the headache and cannot function.  She reports that she has had frequent urinary tract infections in the past, she has now on an antibiotic for this reason.  The patient comes back here for further evaluation.  REVIEW OF SYSTEMS: Out of a complete 14 system review of symptoms, the patient complains only of the following symptoms, and all other reviewed systems are negative.  ALLERGIES: Allergies  Allergen Reactions   Shellfish Allergy  Anaphylaxis   Other     Blood Product Refusal    Pregabalin  Swelling    Able to tolerate Lyrica  (brand name only)   Gabapentin  Rash    HOME MEDICATIONS: Outpatient Medications Prior to Visit  Medication Sig Dispense Refill   AIMOVIG  140 MG/ML SOAJ ADMINISTER 1 ML UNDER THE SKIN EVERY 30 DAYS 1 mL 4   cefdinir  (OMNICEF ) 300 MG capsule Take 1 capsule (300 mg total) by mouth 2 (two) times daily. 20 capsule 0   Cyanocobalamin  (B-12) 2000 MCG TABS Take 2,000 mcg by mouth daily. 30 tablet 3   etodolac  (LODINE ) 500 MG tablet Take 1 tablet (500 mg total) by mouth 2 (two) times daily. 30 tablet 0   fentaNYL  (DURAGESIC  - DOSED MCG/HR) 50 MCG/HR Place 1 patch (50 mcg total) onto the skin every 3 (three) days. 5 patch 0   fluconazole  (DIFLUCAN ) 150 MG tablet Take 1 tablet (150 mg total) by mouth daily. 7 tablet 0   folic acid  (FOLVITE ) 1 MG tablet Take 1 tablet (1 mg total) by mouth daily. 90 tablet 3   furosemide  (LASIX ) 20 MG tablet TAKE 1 TABLET BY MOUTH THREE DAYS A WEEK AS NEEDED FOR PEDAL EDEMA. DO NOT TAKE IF BLOOD PRESSURE IS LESS THAN 110/70 12 tablet 0   hydroxychloroquine  (PLAQUENIL ) 200 MG tablet Take 200-400 mg by mouth See admin instructions. Take 200 mg by mouth every other day alternating with 400 mg (2 tablets) every other day     levETIRAcetam  (KEPPRA ) 500 MG tablet TAKE 1 TABLET(500 MG) BY MOUTH TWICE DAILY 180 tablet 0   midodrine  (PROAMATINE ) 5 MG tablet Take 1 tablet (5 mg total) by mouth 3 (three) times daily with meals. 90 tablet 1   mycophenolate  (MYFORTIC ) 360 MG TBEC EC tablet Take 360-720 mg by mouth See admin instructions. Take 720mg  in the am and 360mg  in pm     Naloxone  HCl 0.4 MG/0.4ML SOAJ Inject 0.4 mLs into the muscle as needed (overdose).     OXYGEN Inhale 2 L/min into the lungs at bedtime.     pantoprazole  (PROTONIX ) 40 MG tablet TAKE 1 TABLET(40 MG) BY MOUTH TWICE DAILY. NEED APPOINTMENT FOR REFILLS 60 tablet 0   polyethylene glycol (MIRALAX  / GLYCOLAX ) packet Take  17 g by mouth daily as needed for mild constipation or moderate constipation. 14 each 0   predniSONE  (DELTASONE ) 2.5 MG tablet Take 2.5 mg by mouth daily with breakfast.     pregabalin  (LYRICA ) 300 MG capsule Take 300 mg by mouth 2 (two) times daily.     ranitidine (ZANTAC) 150 MG capsule Take 150 mg by mouth 2 (two) times daily.     Rimegepant Sulfate (NURTEC) 75 MG TBDP Take 1 tablet at the onset of migraine. Only 1 tablet in 24 hours. 15 tablet 1   rosuvastatin  (CRESTOR ) 20 MG tablet TAKE 1 TABLET(20 MG) BY MOUTH DAILY 90 tablet 1   SYMBICORT 80-4.5 MCG/ACT inhaler Inhale 2 puffs into the lungs 2 (two) times daily.   1  topiramate  (TOPAMAX ) 100 MG tablet Take 1 tablet (100 mg total) by mouth daily. 90 tablet 0   traMADol  (ULTRAM ) 50 MG tablet Take 1-2 tablets (50-100 mg total) by mouth every 4 (four) hours as needed (cough). (Patient taking differently: Take 50-100 mg by mouth every 6 (six) hours as needed for moderate pain.) 84 tablet 0   Vitamin D , Ergocalciferol , (DRISDOL ) 50000 units CAPS capsule Take 1 capsule (50,000 Units total) by mouth every 7 (seven) days. (Patient taking differently: Take 50,000 Units by mouth every Saturday.) 12 capsule 3   No facility-administered medications prior to visit.    PAST MEDICAL HISTORY: Past Medical History:  Diagnosis Date   Anemia    CHF (congestive heart failure) (HCC)    Chronic kidney disease    Chronic pain    COPD (chronic obstructive pulmonary disease) (HCC)    Fibromyalgia    Foley catheter in place    History of kidney stones    Migraine    Migraine without aura, with intractable migraine, so stated, without mention of status migrainosus 05/18/2014   Oxygen dependent    as needed 2L at hs per patient    Peripheral vascular disease (HCC)    Pneumonia    PONV (postoperative nausea and vomiting)    Pulmonary fibrosis (HCC)    Seizures (HCC)    SLE (systemic lupus erythematosus) (HCC) 12/12/2017   UTI (urinary tract infection)  09/30/2018    PAST SURGICAL HISTORY: Past Surgical History:  Procedure Laterality Date   ABDOMINAL HYSTERECTOMY     CHOLECYSTECTOMY     CYSTOSCOPY W/ URETERAL STENT PLACEMENT Left 10/03/2018   Procedure: CYSTOSCOPY WITH RETROGRADE PYELOGRAM/URETERAL STENT PLACEMENT;  Surgeon: Christina Coyer, MD;  Location: [facility];  Service: Urology;  Laterality: Left;   CYSTOSCOPY/URETEROSCOPY/HOLMIUM LASER/STENT PLACEMENT Left 11/05/2018   Procedure: CYSTOSCOPY/URETEROSCOPY/HOLMIUM LASER/STENT EXCHANGE;  Surgeon: Christina Coyer, MD;  Location: [facility];  Service: Urology;  Laterality: Left;  ONLY NEEDS 60 MIN   EYE SURGERY     bilateral cataract surgery    Kidney stome surgery  2012   Left lung biopsy  2015   LUNG BIOPSY Left 11/21/2013   Procedure: LUNG BIOPSY;  Surgeon: Norita Beauvais, MD;  Location: [facility] OR;  Service: Thoracic;  Laterality: Left;   VIDEO ASSISTED THORACOSCOPY Left 11/21/2013   Procedure: VIDEO ASSISTED THORACOSCOPY;  Surgeon: Norita Beauvais, MD;  Location: [facility] OR;  Service: Thoracic;  Laterality: Left;   VIDEO BRONCHOSCOPY N/A 11/21/2013   Procedure: VIDEO BRONCHOSCOPY;  Surgeon: Norita Beauvais, MD;  Location: [facility] OR;  Service: Thoracic;  Laterality: N/A;    FAMILY HISTORY: Family History  Problem Relation Age of Onset   Hypertension Mother    Hepatitis C Mother    Diabetes Father    Hypertension Father    Multiple sclerosis Sister    Bipolar disorder Brother    Breast cancer Neg Hx     SOCIAL HISTORY: Social History   Socioeconomic History   Marital status: Single    Spouse name: Not on file   Number of children: 0   Years of education: college 3   Highest education level: Not on file  Occupational History   Occupation: unemployed  Tobacco Use   Smoking status: Former    Current packs/day: 0.00    Average packs/day: 2.0 packs/day for 20.0 years (40.0 ttl pk-yrs)    Types: Cigarettes    Start date: 04/17/1985    Quit date: 04/17/2005    Years since quitting:  18.8   Smokeless tobacco: Never  Vaping Use   Vaping status: Never Used  Substance and Sexual Activity   Alcohol use: No   Drug use: No   Sexual activity: Never  Other Topics Concern   Not on file  Social History Narrative   Not on file   Social Drivers of Health   Financial Resource Strain: Medium Risk (02/04/2023)   Received from [facility]   Overall Financial Resource Strain (CARDIA)    Difficulty of Paying Living Expenses: Somewhat hard  Food Insecurity: No Food Insecurity (02/04/2023)   Received from [facility]   Hunger Vital Sign    Within the past 12 months, you worried that your food would run out before you got the money to buy more.: Never true    Within the past 12 months, the food you bought just didn't last and you didn't have money to get more.: Never true  Transportation Needs: No Transportation Needs (02/04/2023)   Received from [facility] - Transportation    In the past 12 months, has lack of transportation kept you from medical appointments or from getting medications?: No    Lack of Transportation (Non-Medical): No  Physical Activity: Not on file  Stress: Not on file  Social Connections: Not on file  Intimate Partner Violence: Not on file      PHYSICAL EXAM Generalized: Well developed, in no acute distress   Neurological examination  Mentation: Alert oriented to time, place, history taking. Follows all commands speech and language fluent Cranial nerve II-XII: Facial symmetry noted  DIAGNOSTIC DATA (LABS, IMAGING, TESTING) - I reviewed patient records, labs, notes, testing and imaging myself where available.  Lab Results  Component Value Date   WBC 7.4 11/22/2021   HGB 14.3 11/22/2021   HCT 45.7 11/22/2021   MCV 85 11/22/2021   PLT 227 11/22/2021      Component Value Date/Time   NA 144 12/13/2022 1656   K 3.9 12/13/2022 1656   CL 111 (H) 12/13/2022 1656   CO2 16 (L) 12/13/2022  1656   GLUCOSE 101 (H) 12/13/2022 1656   GLUCOSE 81 11/04/2018 1148   BUN 12 12/13/2022 1656   CREATININE 1.30 (H) 12/13/2022 1656   CREATININE 1.27 (H) 09/27/2016 1006   CALCIUM  8.7 12/13/2022 1656   PROT 6.8 12/13/2022 1656   ALBUMIN 4.3 12/13/2022 1656   AST 15 12/13/2022 1656   ALT 5 12/13/2022 1656   ALKPHOS 298 (H) 12/13/2022 1656   BILITOT 0.3 12/13/2022 1656   GFRNONAA 62 06/14/2020 1349   GFRNONAA 48 (L) 09/27/2016 1006   GFRAA 72 06/14/2020 1349   GFRAA 55 (L) 09/27/2016 1006   Lab Results  Component Value Date   CHOL 250 (H) 11/22/2021   HDL 53 11/22/2021   LDLCALC 173 (H) 11/22/2021   TRIG 134 11/22/2021   CHOLHDL 3.5 08/08/2018   Lab Results  Component Value Date   HGBA1C 5.2 12/13/2022   Lab Results  Component Value Date   VITAMINB12 231 10/07/2013   Lab Results  Component Value Date   TSH 0.972 09/10/2014      ASSESSMENT AND PLAN 63 y.o. year old female  has a past medical history of Anemia, CHF (congestive heart failure) (HCC), Chronic kidney disease, Chronic pain, COPD (chronic obstructive pulmonary disease) (HCC), Fibromyalgia, Foley catheter in place, History of kidney stones, Migraine, Migraine without aura, with intractable migraine, so stated, without mention of status migrainosus (05/18/2014),  Oxygen dependent, Peripheral vascular disease (HCC), Pneumonia, PONV (postoperative nausea and vomiting), Pulmonary fibrosis (HCC), Seizures (HCC), SLE (systemic lupus erythematosus) (HCC) (12/12/2017), and UTI (urinary tract infection) (09/30/2018). here with:  Migraines Seizures  Continue Aimovig  monthly injection Continue Topamax  100 mg daily Continue Keppra  500 mg BID.  Take Nurtec 75 mg at the onset of migraine. Only 1 tablet in 24 hours. FU in 6 months in office or sooner if needed.     Clem Currier, MSN, NP-C 03/06/2024, 9:21 AM [facility] [address], [address] [place], [place] [address] [phone]  The patient's  condition requires frequent monitoring and adjustments in the treatment plan, reflecting the ongoing complexity of care.  This provider is the continuing focal point for all needed services for this condition.

## 2024-03-24 ENCOUNTER — Other Ambulatory Visit: Payer: Self-pay | Admitting: Family Medicine

## 2024-03-24 DIAGNOSIS — R600 Localized edema: Secondary | ICD-10-CM

## 2024-04-03 ENCOUNTER — Telehealth: Payer: Self-pay | Admitting: Family Medicine

## 2024-04-03 NOTE — Telephone Encounter (Signed)
 Copied from CRM [id number]. Topic: Clinical - Medication Prior Auth >> Apr 03, 2024  4:30 PM Deleta RAMAN wrote:  Reason for CRM: patient dentist is telling her she would need authorization by MD [place]lin in order to be seen for appointment. A letter was faxed over to the office and the patient would like to be notified once received and completed. You can contact the patient at [phone]. She also wants clarification on why this is needed.

## 2024-04-04 NOTE — Telephone Encounter (Signed)
 Have you seen any paperwork for this patient?

## 2024-04-08 NOTE — Telephone Encounter (Signed)
 Form has been faxed to [facility].

## 2024-04-17 ENCOUNTER — Institutional Professional Consult (permissible substitution) (INDEPENDENT_AMBULATORY_CARE_PROVIDER_SITE_OTHER): Payer: MEDICAID | Admitting: Otolaryngology

## 2024-04-17 NOTE — Progress Notes (Deleted)
 ENT CONSULT:  Reason for Consult: ***   HPI: Discussed the use of AI scribe software for clinical note transcription with the patient, who gave verbal consent to proceed.  History of Present Illness      Records Reviewed:  [facility] office visit  Pulm 11/14/23 [name] [name] is a 63 y.o. woman with former smoking, SLE overlap with inflammatory arthritis, ILD (VATS-biopsy proven OP March 2015), fibromyalgia, HFpEF, CKD who presents for follow-up of CTD-ILD.  She was previously followed by Dr. Edwin. She first presented to [facility] ILD clinic in 2015 for a second opinion on dyspnea and organizing pneumonia diagnosed locally by surgical lung biopsy in March 2015 following an ICU admission for hypoxemia. The OP diagnosis was confirmed after review of imaging and pathology at [facility] multidisciplinary ILD conference. 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 Initial serologies were notable for +p-ANCA, +MPO Ab, +ANA (1:320, anti-centromere pattern), and ESR 80. She was treated with Cellcept and prednisone  from 2015 until mid-2017 when this was weaned off, but prednisone  was resumed 6 months later in December 2017 due to worsening cough, dyspnea, decline in FVC, and new chest CT opacities. In 2018 she saw Rheumatology and was found to have inflammatory arthritis, +dsDNA, +SS-A, and low C3 and was diagnosed with SLE overlap syndrome. She has been managed with prednisone , Myfortic , and plaquenil . She is followed by spine center, Neurology, Nephrology, and Rheumatology.     Past Medical History:  Diagnosis Date   Anemia    CHF (congestive heart failure) (HCC)    Chronic kidney disease    Chronic pain    COPD (chronic obstructive pulmonary disease) (HCC)    Fibromyalgia    Foley catheter in place    History of kidney stones    Migraine    Migraine without aura, with intractable migraine,  so stated, without mention of status migrainosus 05/18/2014   Oxygen dependent    as needed 2L at hs per patient    Peripheral vascular disease (HCC)    Pneumonia    PONV (postoperative nausea and vomiting)    Pulmonary fibrosis (HCC)    Seizures (HCC)    SLE (systemic lupus erythematosus) (HCC) 12/12/2017   UTI (urinary tract infection) 09/30/2018    Past Surgical History:  Procedure Laterality Date   ABDOMINAL HYSTERECTOMY     CHOLECYSTECTOMY     CYSTOSCOPY W/ URETERAL STENT PLACEMENT Left 10/03/2018   Procedure: CYSTOSCOPY WITH RETROGRADE PYELOGRAM/URETERAL STENT PLACEMENT;  Surgeon: Kimra Cough, MD;  Location: [facility];  Service: Urology;  Laterality: Left;   CYSTOSCOPY/URETEROSCOPY/HOLMIUM LASER/STENT PLACEMENT Left 11/05/2018   Procedure: CYSTOSCOPY/URETEROSCOPY/HOLMIUM LASER/STENT EXCHANGE;  Surgeon: Suhailah Cough, MD;  Location: [facility];  Service: Urology;  Laterality: Left;  ONLY NEEDS 60 MIN   EYE SURGERY     bilateral cataract surgery    Kidney stome surgery  2012   Left lung biopsy  2015   LUNG BIOPSY Left 11/21/2013   Procedure: LUNG BIOPSY;  Surgeon: Dallas KATHEE Jude, MD;  Location: [facility] OR;  Service: Thoracic;  Laterality: Left;   VIDEO ASSISTED THORACOSCOPY Left 11/21/2013   Procedure: VIDEO ASSISTED THORACOSCOPY;  Surgeon: Dallas KATHEE Jude, MD;  Location: [facility] OR;  Service: Thoracic;  Laterality: Left;   VIDEO BRONCHOSCOPY N/A 11/21/2013   Procedure: VIDEO BRONCHOSCOPY;  Surgeon: Dallas KATHEE Jude, MD;  Location: [facility] OR;  Service: Thoracic;  Laterality: N/A;    Family History  Problem Relation  Age of Onset   Hypertension Mother    Hepatitis C Mother    Diabetes Father    Hypertension Father    Multiple sclerosis Sister    Bipolar disorder Brother    Breast cancer Neg Hx     Social History:  reports that she quit smoking about 19 years ago. Her smoking use included cigarettes. She started smoking about 39 years ago. She has a 40 pack-year smoking history. She has  never used smokeless tobacco. She reports that she does not drink alcohol and does not use drugs.  Allergies:  Allergies  Allergen Reactions   Shellfish Allergy Anaphylaxis   Other     Blood Product Refusal    Pregabalin  Swelling    Able to tolerate Lyrica  (brand name only)   Gabapentin  Rash    Medications: I have reviewed the patient's current medications.  The PMH, PSH, Medications, Allergies, and SH were reviewed and updated.  ROS: Constitutional: Negative for fever, weight loss and weight gain. Cardiovascular: Negative for chest pain and dyspnea on exertion. Respiratory: Is not experiencing shortness of breath at rest. Gastrointestinal: Negative for nausea and vomiting. Neurological: Negative for headaches. Psychiatric: The patient is not nervous/anxious  There were no vitals taken for this visit. There is no height or weight on file to calculate BMI.  PHYSICAL EXAM:  Exam: General: Well-developed, well-nourished Communication and Voice: Clear pitch and clarity Respiratory Respiratory effort: Equal inspiration and expiration without stridor Cardiovascular Peripheral Vascular: Warm extremities with equal color/perfusion Eyes: No nystagmus with equal extraocular motion bilaterally Neuro/Psych/Balance: Patient oriented to person, place, and time; Appropriate mood and affect; Gait is intact with no imbalance; Cranial nerves I-XII are intact Head and Face Inspection: Normocephalic and atraumatic without mass or lesion Palpation: Facial skeleton intact without bony stepoffs Salivary Glands: No mass or tenderness Facial Strength: Facial motility symmetric and full bilaterally ENT Pinna: External ear intact and fully developed External canal: Canal is patent with intact skin Tympanic Membrane: Clear and mobile External Nose: No scar or anatomic deformity Internal Nose: Septum is ***. No polyp, or purulence. Mucosal edema and erythema present.  Bilateral inferior turbinate  hypertrophy.  Lips, Teeth, and gums: Mucosa and teeth intact and viable TMJ: No pain to palpation with full mobility Oral cavity/oropharynx: No erythema or exudate, no lesions present Nasopharynx: No mass or lesion with intact mucosa Hypopharynx: Intact mucosa without pooling of secretions Larynx Glottic: Full true vocal cord mobility without lesion or mass Supraglottic: Normal appearing epiglottis and AE folds Interarytenoid Space: Moderate pachydermia&edema Subglottic Space: Patent without lesion or edema Neck Neck and Trachea: Midline trachea without mass or lesion Thyroid : No mass or nodularity Lymphatics: No lymphadenopathy  Procedure: {lsofficescopes (Optional):32311}     Studies Reviewed:***  Assessment/Plan: No diagnosis found.  Assessment and Plan Assessment & Plan       Thank you for allowing me to participate in the care of this patient. Please do not hesitate to contact me with any questions or concerns.   Elena Larry, MD Otolaryngology [facility] Phone: [phone] Fax: [phone]    04/17/2024, 3:05 PM

## 2024-04-18 ENCOUNTER — Telehealth: Payer: Self-pay | Admitting: Family Medicine

## 2024-04-18 NOTE — Telephone Encounter (Signed)
 Called patient to confirm upcoming appointment 04/21/2024 at 2:30 pm. Patient appointment has been successfully confirmed

## 2024-04-21 ENCOUNTER — Ambulatory Visit: Payer: MEDICAID | Attending: Family Medicine | Admitting: Family Medicine

## 2024-04-21 ENCOUNTER — Encounter: Payer: Self-pay | Admitting: Family Medicine

## 2024-04-21 VITALS — BP 127/84 | HR 60 | Ht 61.0 in | Wt 158.6 lb

## 2024-04-21 DIAGNOSIS — E7800 Pure hypercholesterolemia, unspecified: Secondary | ICD-10-CM

## 2024-04-21 DIAGNOSIS — R300 Dysuria: Secondary | ICD-10-CM

## 2024-04-21 DIAGNOSIS — R600 Localized edema: Secondary | ICD-10-CM

## 2024-04-21 DIAGNOSIS — I959 Hypotension, unspecified: Secondary | ICD-10-CM

## 2024-04-21 DIAGNOSIS — G894 Chronic pain syndrome: Secondary | ICD-10-CM

## 2024-04-21 DIAGNOSIS — E559 Vitamin D deficiency, unspecified: Secondary | ICD-10-CM

## 2024-04-21 DIAGNOSIS — R569 Unspecified convulsions: Secondary | ICD-10-CM

## 2024-04-21 DIAGNOSIS — M7502 Adhesive capsulitis of left shoulder: Secondary | ICD-10-CM | POA: Diagnosis not present

## 2024-04-21 DIAGNOSIS — J849 Interstitial pulmonary disease, unspecified: Secondary | ICD-10-CM

## 2024-04-21 DIAGNOSIS — G43809 Other migraine, not intractable, without status migrainosus: Secondary | ICD-10-CM

## 2024-04-21 LAB — POCT URINALYSIS DIP (CLINITEK)
Bilirubin, UA: NEGATIVE
Blood, UA: NEGATIVE
Glucose, UA: NEGATIVE mg/dL
Ketones, POC UA: NEGATIVE mg/dL
Nitrite, UA: NEGATIVE
POC PROTEIN,UA: NEGATIVE
Spec Grav, UA: 1.02 (ref 1.010–1.025)
Urobilinogen, UA: 0.2 U/dL
pH, UA: 7 (ref 5.0–8.0)

## 2024-04-21 MED ORDER — ROSUVASTATIN CALCIUM 20 MG PO TABS: 20.0000 mg | ORAL_TABLET | Freq: Every day | ORAL | 1 refills | Status: DC

## 2024-04-21 MED ORDER — FUROSEMIDE 20 MG PO TABS: ORAL_TABLET | ORAL | 1 refills | Status: DC

## 2024-04-21 MED ORDER — MIDODRINE HCL 5 MG PO TABS: 5.0000 mg | ORAL_TABLET | Freq: Three times a day (TID) | ORAL | 1 refills | Status: DC

## 2024-04-21 NOTE — Progress Notes (Signed)
 Subjective:  Patient ID: [name] [name], female    DOB: Jan 19, 1961  Age: 63 y.o. MRN: [id number]  CC: burning urination (Left shoulder pain)     Discussed the use of AI scribe software for clinical note transcription with the patient, who gave verbal consent to proceed.  History of Present Illness Alejandrina Raimer is a 63 year old female with Fibromyalgia, systemic lupus erythematosus with interstitial lung disease,  chronic respiratory failure (on 2L at night), chronic low back pain (followed by pain management), GERD, seizures, migraines who , history of cystoscopy, left ureteroscopy with laser lithotripsy and stent exchange, peripheral arterial disease, s/pTAH in the 90s (secondary to ?Endometrial ca in [place] thyroid  nodules and lymphadenopathy. who presents with burning urination and left shoulder pain.  She experiences burning during urination for the past month. She has not taken antibiotics but used a natural remedy her sister brought in from [place] which alleviated the burning sensation. There is no fever or chills. She expresses concerns about kidney pain, feeling discomfort in the back, lung, and kidney areas.    She has significant left shoulder pain, limiting her range of motion. She cannot fully raise her arm and has moved to a recliner due to the pain. Her sister assists with dressing. She experiences numbness, tingling, and cramping in her left arm and toes. She is taking fentanyl  and Lyrica  for pain management. Previous physical therapy and acupuncture were unsuccessful while in [place]  She manages swelling by elevating her legs in a recliner and takes furosemide  and midodrine  for blood pressure and swelling control.     Past Medical History:  Diagnosis Date   Anemia    CHF (congestive heart failure) (HCC)    Chronic kidney disease    Chronic pain    COPD (chronic obstructive pulmonary disease) (HCC)    Fibromyalgia    Foley catheter in place    History  of kidney stones    Migraine    Migraine without aura, with intractable migraine, so stated, without mention of status migrainosus 05/18/2014   Oxygen dependent    as needed 2L at hs per patient    Peripheral vascular disease (HCC)    Pneumonia    PONV (postoperative nausea and vomiting)    Pulmonary fibrosis (HCC)    Seizures (HCC)    SLE (systemic lupus erythematosus) (HCC) 12/12/2017   UTI (urinary tract infection) 09/30/2018    Past Surgical History:  Procedure Laterality Date   ABDOMINAL HYSTERECTOMY     CHOLECYSTECTOMY     CYSTOSCOPY W/ URETERAL STENT PLACEMENT Left 10/03/2018   Procedure: CYSTOSCOPY WITH RETROGRADE PYELOGRAM/URETERAL STENT PLACEMENT;  Surgeon: Alexxis Cough, MD;  Location: [facility];  Service: Urology;  Laterality: Left;   CYSTOSCOPY/URETEROSCOPY/HOLMIUM LASER/STENT PLACEMENT Left 11/05/2018   Procedure: CYSTOSCOPY/URETEROSCOPY/HOLMIUM LASER/STENT EXCHANGE;  Surgeon: Mattalynn Cough, MD;  Location: [facility];  Service: Urology;  Laterality: Left;  ONLY NEEDS 60 MIN   EYE SURGERY     bilateral cataract surgery    Kidney stome surgery  2012   Left lung biopsy  2015   LUNG BIOPSY Left 11/21/2013   Procedure: LUNG BIOPSY;  Surgeon: Dallas KATHEE Jude, MD;  Location: [facility] OR;  Service: Thoracic;  Laterality: Left;   VIDEO ASSISTED THORACOSCOPY Left 11/21/2013   Procedure: VIDEO ASSISTED THORACOSCOPY;  Surgeon: Dallas KATHEE Jude, MD;  Location: [facility] OR;  Service: Thoracic;  Laterality: Left;   VIDEO BRONCHOSCOPY N/A 11/21/2013   Procedure: VIDEO BRONCHOSCOPY;  Surgeon: Dallas KATHEE Jude, MD;  Location: [facility] OR;  Service: Thoracic;  Laterality: N/A;    Family History  Problem Relation Age of Onset   Hypertension Mother    Hepatitis C Mother    Diabetes Father    Hypertension Father    Multiple sclerosis Sister    Bipolar disorder Brother    Breast cancer Neg Hx     Social History   Socioeconomic History   Marital status: Single    Spouse name: Not on file   Number of  children: 0   Years of education: college 3   Highest education level: Some college, no degree  Occupational History   Occupation: unemployed  Tobacco Use   Smoking status: Former    Current packs/day: 0.00    Average packs/day: 2.0 packs/day for 20.0 years (40.0 ttl pk-yrs)    Types: Cigarettes    Start date: 04/17/1985    Quit date: 04/17/2005    Years since quitting: 19.0   Smokeless tobacco: Never  Vaping Use   Vaping status: Never Used  Substance and Sexual Activity   Alcohol use: No   Drug use: No   Sexual activity: Never  Other Topics Concern   Not on file  Social History Narrative   Not on file   Social Drivers of Health   Financial Resource Strain: High Risk (04/17/2024)   Overall Financial Resource Strain (CARDIA)    Difficulty of Paying Living Expenses: Very hard  Food Insecurity: Patient Declined (04/17/2024)   Hunger Vital Sign    Worried About Running Out of Food in the Last Year: Patient declined    Ran Out of Food in the Last Year: Patient declined  Transportation Needs: Patient Declined (04/17/2024)   PRAPARE - Administrator, Civil Service (Medical): Patient declined    Lack of Transportation (Non-Medical): Patient declined  Physical Activity: Inactive (04/17/2024)   Exercise Vital Sign    Days of Exercise per Week: 0 days    Minutes of Exercise per Session: Not on file  Stress: No Stress Concern Present (04/17/2024)   [employer] of Occupational Health - Occupational Stress Questionnaire    Feeling of Stress: Not at all  Social Connections: Unknown (04/17/2024)   Social Connection and Isolation Panel    Frequency of Communication with Friends and Family: More than three times a week    Frequency of Social Gatherings with Friends and Family: Patient declined    Attends Religious Services: Patient declined    Database administrator or Organizations: Patient declined    Attends Engineer, structural: Not on file    Marital Status:  Patient declined    Allergies  Allergen Reactions   Shellfish Allergy Anaphylaxis   Other     Blood Product Refusal    Pregabalin  Swelling    Able to tolerate Lyrica  (brand name only)   Gabapentin  Rash    Outpatient Medications Prior to Visit  Medication Sig Dispense Refill   cefdinir  (OMNICEF ) 300 MG capsule Take 1 capsule (300 mg total) by mouth 2 (two) times daily. 20 capsule 0   Cyanocobalamin  (B-12) 2000 MCG TABS Take 2,000 mcg by mouth daily. 30 tablet 3   Erenumab -aooe (AIMOVIG ) 140 MG/ML SOAJ ADMINISTER 1 ML UNDER THE SKIN EVERY 30 DAYS 1 mL 11   etodolac  (LODINE ) 500 MG tablet Take 1 tablet (500 mg total) by mouth 2 (two) times daily. 30 tablet 0   fentaNYL  (DURAGESIC  - DOSED MCG/HR) 50 MCG/HR Place 1 patch (50 mcg total) onto  the skin every 3 (three) days. 5 patch 0   fluconazole  (DIFLUCAN ) 150 MG tablet Take 1 tablet (150 mg total) by mouth daily. 7 tablet 0   folic acid  (FOLVITE ) 1 MG tablet Take 1 tablet (1 mg total) by mouth daily. 90 tablet 3   hydroxychloroquine  (PLAQUENIL ) 200 MG tablet Take 200-400 mg by mouth See admin instructions. Take 200 mg by mouth every other day alternating with 400 mg (2 tablets) every other day     levETIRAcetam  (KEPPRA ) 500 MG tablet TAKE 1 TABLET(500 MG) BY MOUTH TWICE DAILY 180 tablet 3   mycophenolate  (MYFORTIC ) 360 MG TBEC EC tablet Take 360-720 mg by mouth See admin instructions. Take 720mg  in the am and 360mg  in pm     Naloxone  HCl 0.4 MG/0.4ML SOAJ Inject 0.4 mLs into the muscle as needed (overdose).     OXYGEN Inhale 2 L/min into the lungs at bedtime.     pantoprazole  (PROTONIX ) 40 MG tablet TAKE 1 TABLET(40 MG) BY MOUTH TWICE DAILY. NEED APPOINTMENT FOR REFILLS 60 tablet 0   polyethylene glycol (MIRALAX  / GLYCOLAX ) packet Take 17 g by mouth daily as needed for mild constipation or moderate constipation. 14 each 0   predniSONE  (DELTASONE ) 2.5 MG tablet Take 2.5 mg by mouth daily with breakfast.     pregabalin  (LYRICA ) 300 MG capsule  Take 300 mg by mouth 2 (two) times daily.     ranitidine (ZANTAC) 150 MG capsule Take 150 mg by mouth 2 (two) times daily.     Rimegepant Sulfate (NURTEC) 75 MG TBDP Take 1 tablet at the onset of migraine. Only 1 tablet in 24 hours. 8 tablet 11   SYMBICORT 80-4.5 MCG/ACT inhaler Inhale 2 puffs into the lungs 2 (two) times daily.   1   topiramate  (TOPAMAX ) 100 MG tablet Take 1 tablet (100 mg total) by mouth daily. 90 tablet 3   traMADol  (ULTRAM ) 50 MG tablet Take 1-2 tablets (50-100 mg total) by mouth every 4 (four) hours as needed (cough). (Patient taking differently: Take 50-100 mg by mouth every 6 (six) hours as needed for moderate pain.) 84 tablet 0   Vitamin D , Ergocalciferol , (DRISDOL ) 50000 units CAPS capsule Take 1 capsule (50,000 Units total) by mouth every 7 (seven) days. (Patient taking differently: Take 50,000 Units by mouth every Saturday.) 12 capsule 3   furosemide  (LASIX ) 20 MG tablet TAKE 1 TABLET BY MOUTH 3 DAYS A WEEK AS NEEDED FOR PEDAL EDEMA. DO NOT TAKE IF BLOOD PRESSURE IS LESS THAN 110/70 12 tablet 0   midodrine  (PROAMATINE ) 5 MG tablet Take 1 tablet (5 mg total) by mouth 3 (three) times daily with meals. 90 tablet 1   rosuvastatin  (CRESTOR ) 20 MG tablet TAKE 1 TABLET(20 MG) BY MOUTH DAILY 90 tablet 1   No facility-administered medications prior to visit.     ROS Review of Systems  Constitutional:  Negative for activity change and appetite change.  HENT:  Negative for sinus pressure and sore throat.   Respiratory:  Negative for chest tightness, shortness of breath and wheezing.   Cardiovascular:  Positive for leg swelling. Negative for chest pain and palpitations.  Gastrointestinal:  Negative for abdominal distention, abdominal pain and constipation.  Genitourinary:  Positive for dysuria.  Musculoskeletal:        See HPI  Psychiatric/Behavioral:  Negative for behavioral problems and dysphoric mood.     Objective:  BP 127/84   Pulse 60   Ht 5' 1 (1.549 m)   Wt  158  lb 9.6 oz (71.9 kg)   SpO2 100%   BMI 29.97 kg/m      04/21/2024    2:36 PM 12/13/2022    4:31 PM 11/15/2021    3:26 PM  BP/Weight  Systolic BP 127 80 112  Diastolic BP 84 60 79  Wt. (Lbs) 158.6 156.2 153.6  BMI 29.97 kg/m2 29.51 kg/m2 29.02 kg/m2      Physical Exam Constitutional:      Appearance: She is well-developed.  Cardiovascular:     Rate and Rhythm: Normal rate.     Heart sounds: Normal heart sounds. No murmur heard. Pulmonary:     Effort: Pulmonary effort is normal.     Breath sounds: Normal breath sounds. No wheezing or rales.  Chest:     Chest wall: No tenderness.  Abdominal:     General: Bowel sounds are normal. There is no distension.     Palpations: Abdomen is soft. There is no mass.     Tenderness: There is no abdominal tenderness. There is right CVA tenderness and left CVA tenderness.  Musculoskeletal:     Right lower leg: Edema (1+) present.     Left lower leg: Edema (1+) present.     Comments: Abduction of LEFT UPPER EXTREMITY limited to 30 degrees Severe TTP of anterior, posterior and superior left shoulder joint  Neurological:     Mental Status: She is alert and oriented to person, place, and time.  Psychiatric:        Mood and Affect: Mood normal.        Latest Ref Rng & Units 12/13/2022    4:56 PM 11/22/2021   11:17 AM 06/14/2020    1:49 PM  CMP  Glucose 70 - 99 mg/dL 898  93  81   BUN 8 - 27 mg/dL 12  11  7    Creatinine 0.57 - 1.00 mg/dL 8.69  8.79  8.99   Sodium 134 - 144 mmol/L 144  143  141   Potassium 3.5 - 5.2 mmol/L 3.9  3.8  3.6   Chloride 96 - 106 mmol/L 111  105  101   CO2 20 - 29 mmol/L 16  20  24    Calcium  8.7 - 10.3 mg/dL 8.7  8.9  9.1   Total Protein 6.0 - 8.5 g/dL 6.8  6.9  6.9   Total Bilirubin 0.0 - 1.2 mg/dL 0.3  0.4  0.6   Alkaline Phos 44 - 121 IU/L 298  272  173   AST 0 - 40 IU/L 15  17  13    ALT 0 - 32 IU/L 5  9  5      Lipid Panel     Component Value Date/Time   CHOL 250 (H) 11/22/2021 1117   TRIG 134  11/22/2021 1117   HDL 53 11/22/2021 1117   CHOLHDL 3.5 08/08/2018 1152   CHOLHDL 5.3 09/10/2014 1142   VLDL 36 09/10/2014 1142   LDLCALC 173 (H) 11/22/2021 1117    CBC    Component Value Date/Time   WBC 7.4 11/22/2021 1117   WBC 8.0 11/04/2018 1148   RBC 5.39 (H) 11/22/2021 1117   RBC 4.24 11/04/2018 1148   HGB 14.3 11/22/2021 1117   HCT 45.7 11/22/2021 1117   PLT 227 11/22/2021 1117   MCV 85 11/22/2021 1117   MCH 26.5 (L) 11/22/2021 1117   MCH 25.7 (L) 11/04/2018 1148   MCHC 31.3 (L) 11/22/2021 1117   MCHC 28.4 (L) 11/04/2018 1148   RDW 13.5  11/22/2021 1117   LYMPHSABS 2.9 11/22/2021 1117   MONOABS 0.8 10/02/2018 0319   EOSABS 0.1 11/22/2021 1117   BASOSABS 0.0 11/22/2021 1117    Lab Results  Component Value Date   HGBA1C 5.2 12/13/2022       Assessment & Plan Adhesive capsulitis of left shoulder Severe adhesive capsulitis with reduced range of motion and pain, likely worsened by fibromyalgia. No prior cortisone injection. Open to physical therapy. - Refer to physical therapy for shoulder rehabilitation. - Consider orthopedic referral for further evaluation. - Discuss potential for cortisone injection in the shoulder.  Urinary symptoms (burning on urination) Burning on urination with trace leukocytes in urine, indicating possible infection. - Send urine culture to determine if antibiotics are needed.  Edema of lower extremities Persistent lower extremity edema despite furosemide  use. - Refill furosemide  prescription.  Hypotension, on midodrine  Hypotension stabilized with midodrine . - Continue midodrine  therapy.  Hyperlipidemia Hyperlipidemia management requires updated cholesterol levels. -Continue statin - Order lipid panel. - Schedule fasting blood test for cholesterol.  Vitamin D  deficiency Vitamin D  deficiency with recent lapse in supplement intake. - Order vitamin D  level test.  Interstitial lung disease Managed with mycophenolate  under  pulmonologist care.  Systemic lupus erythematosus Managed with hydroxychloroquine  under rheumatologist care.  Seizure disorder Managed with levetiracetam  under neurologist care.  Migraine Effective symptom control with Aimovig  and Nurtec.  Chronic pain syndrome Discussed the option of referral for aquatic therapy but she is undecided at this time as she does have several appointments and would like to prioritize her thyroid  biopsy with ENT She will let me know when she is ready for referral Continue pain management per pain management clinic   Meds ordered this encounter  Medications   furosemide  (LASIX ) 20 MG tablet    Sig: TAKE 1 TABLET BY MOUTH 3 DAYS A WEEK AS NEEDED FOR PEDAL EDEMA. DO NOT TAKE IF BLOOD PRESSURE IS LESS THAN 110/70    Dispense:  30 tablet    Refill:  1   midodrine  (PROAMATINE ) 5 MG tablet    Sig: Take 1 tablet (5 mg total) by mouth 3 (three) times daily with meals.    Dispense:  90 tablet    Refill:  1   rosuvastatin  (CRESTOR ) 20 MG tablet    Sig: Take 1 tablet (20 mg total) by mouth daily.    Dispense:  90 tablet    Refill:  1    Follow-up: Return in about 6 months (around 10/22/2024) for Chronic medical conditions.       Corrina Sabin, MD, FAAFP. [facility] [place], [place] [phone]   04/21/2024, 3:59 PM

## 2024-04-21 NOTE — Patient Instructions (Signed)
 VISIT SUMMARY:  During today's visit, we discussed your burning urination, left shoulder pain, and other ongoing health concerns. We reviewed your current medications and made plans for further evaluation and treatment to address your symptoms and improve your overall health.  YOUR PLAN:  -ADHESIVE CAPSULITIS OF LEFT SHOULDER: Adhesive capsulitis, also known as frozen shoulder, is a condition characterized by stiffness and pain in your shoulder joint. We will refer you to physical therapy to help improve your shoulder's range of motion and consider an orthopedic referral for further evaluation. We also discussed the potential for a cortisone injection to reduce inflammation and pain.  -URINARY SYMPTOMS (BURNING ON URINATION): Your burning urination may indicate a urinary tract infection. We will send a urine culture to determine if antibiotics are needed.  -EDEMA OF LOWER EXTREMITIES: Edema is swelling caused by excess fluid trapped in your body's tissues. We will refill your furosemide  prescription to help manage this condition.  -HYPOTENSION, ON MIDODRINE : Hypotension is low blood pressure. Your condition is currently stabilized with midodrine , so we will continue this therapy.  -HYPERLIPIDEMIA: Hyperlipidemia is having high levels of fats (lipids) in your blood, which can increase your risk of heart disease. We will order a lipid panel and schedule a fasting blood test to check your cholesterol levels.  -VITAMIN D  DEFICIENCY: Vitamin D  deficiency means you have lower than normal levels of vitamin D , which is important for bone health. We will order a vitamin D  level test to check your current levels.  -INTERSTITIAL LUNG DISEASE: Interstitial lung disease refers to a group of disorders that cause scarring of the lungs. This condition is managed with mycophenolate  under the care of your pulmonologist.  -SYSTEMIC LUPUS ERYTHEMATOSUS: Systemic lupus erythematosus is an autoimmune disease where  your immune system attacks your own tissues. This condition is managed with hydroxychloroquine  under the care of your rheumatologist.  -SEIZURE DISORDER: A seizure disorder is a condition where you experience seizures. This condition is managed with levetiracetam  under the care of your neurologist.  -MIGRAINE: Migraines are severe headaches often accompanied by nausea and sensitivity to light and sound. Your symptoms are effectively controlled with Aimovig  and Nurtec.  INSTRUCTIONS:  1. Follow up with physical therapy for your shoulder rehabilitation. 2. Consider seeing an orthopedic specialist for further evaluation of your shoulder. 3. Await results of the urine culture to determine if antibiotics are needed. 4. Continue taking furosemide  for edema and midodrine  for hypotension as prescribed. 5. Schedule a fasting blood test for cholesterol levels and a vitamin D  level test. 6. Continue managing your interstitial lung disease, systemic lupus erythematosus, seizure disorder, and migraines with your current medications and follow up with your respective specialists as needed.

## 2024-04-23 ENCOUNTER — Ambulatory Visit: Payer: Self-pay | Admitting: Family Medicine

## 2024-04-23 LAB — URINE CULTURE

## 2024-05-02 ENCOUNTER — Other Ambulatory Visit (HOSPITAL_COMMUNITY): Payer: Self-pay

## 2024-05-05 ENCOUNTER — Telehealth: Payer: Self-pay

## 2024-05-05 NOTE — Telephone Encounter (Signed)
Noted will look out for paperwork

## 2024-05-05 NOTE — Telephone Encounter (Signed)
 Copied from CRM #[id number]. Topic: Clinical - Lab/Test Results >> May 05, 2024 12:13 PM Nathanel BROCKS wrote: Reason for CRM:   [facility] is calling to see if we got a fax for lab order requesting. Please advise. [phone] option 3

## 2024-05-05 NOTE — Telephone Encounter (Signed)
 Copied from CRM [id number]. Topic: General - Other >> May 05, 2024 11:58 AM Jayma L wrote:  Reason for CRM: patient called asking if we got labs orders from [facility]. Advised they wasn't here yet. Gave fax number incase  >> May 05, 2024 12:48 PM Larissa S wrote: Patient requesting a callback regarding fax.

## 2024-05-06 NOTE — Telephone Encounter (Signed)
 Copied from CRM #[id number]. Topic: Clinical - Lab/Test Results >> May 05, 2024 12:13 PM Nathanel BROCKS wrote: Reason for CRM:   [name] is calling to see if we got a fax for lab order requesting. Please advise. [phone] option 3  >> May 06, 2024  4:48 PM Delon DASEN wrote:  Patient calling to follow up that results were received from [facility].

## 2024-05-07 NOTE — Telephone Encounter (Signed)
 Call has been placed to [facility] to inform them that we have not received any lab reports or request. They will be faxing orders to [phone]

## 2024-05-08 ENCOUNTER — Ambulatory Visit: Payer: MEDICAID | Admitting: Sports Medicine

## 2024-05-20 ENCOUNTER — Other Ambulatory Visit (HOSPITAL_COMMUNITY): Payer: Self-pay

## 2024-05-26 ENCOUNTER — Encounter: Payer: Self-pay | Admitting: Sports Medicine

## 2024-05-26 ENCOUNTER — Other Ambulatory Visit (INDEPENDENT_AMBULATORY_CARE_PROVIDER_SITE_OTHER): Payer: MEDICAID

## 2024-05-26 ENCOUNTER — Ambulatory Visit (INDEPENDENT_AMBULATORY_CARE_PROVIDER_SITE_OTHER): Payer: MEDICAID | Admitting: Sports Medicine

## 2024-05-26 DIAGNOSIS — M25512 Pain in left shoulder: Secondary | ICD-10-CM

## 2024-05-26 DIAGNOSIS — M5442 Lumbago with sciatica, left side: Secondary | ICD-10-CM

## 2024-05-26 DIAGNOSIS — M5441 Lumbago with sciatica, right side: Secondary | ICD-10-CM | POA: Diagnosis not present

## 2024-05-26 DIAGNOSIS — S32010S Wedge compression fracture of first lumbar vertebra, sequela: Secondary | ICD-10-CM | POA: Diagnosis not present

## 2024-05-26 DIAGNOSIS — M542 Cervicalgia: Secondary | ICD-10-CM

## 2024-05-26 DIAGNOSIS — M797 Fibromyalgia: Secondary | ICD-10-CM

## 2024-05-26 DIAGNOSIS — G8929 Other chronic pain: Secondary | ICD-10-CM | POA: Diagnosis not present

## 2024-05-26 DIAGNOSIS — M3219 Other organ or system involvement in systemic lupus erythematosus: Secondary | ICD-10-CM

## 2024-05-26 MED ORDER — METHYLPREDNISOLONE ACETATE 40 MG/ML IJ SUSP
40.0000 mg | INTRAMUSCULAR | Status: AC | PRN
  Administered 2024-05-26: 40 mg via INTRA_ARTICULAR

## 2024-05-26 MED ORDER — LIDOCAINE HCL 1 % IJ SOLN
2.0000 mL | INTRAMUSCULAR | Status: AC | PRN
  Administered 2024-05-26: 2 mL

## 2024-05-26 MED ORDER — BUPIVACAINE HCL 0.25 % IJ SOLN
2.0000 mL | INTRAMUSCULAR | Status: AC | PRN
  Administered 2024-05-26: 2 mL via INTRA_ARTICULAR

## 2024-05-26 NOTE — Progress Notes (Signed)
 [name] [name] - 63 y.o. female MRN [id number]  Date of birth: May 07, 1961  Office Visit Note: Visit Date: 05/26/2024 PCP: Delbert Clam, MD Referred by: Delbert Clam, MD  Subjective: Chief Complaint  Patient presents with   Neck - Pain   Lower Back - Pain   HPI: [name] [name] is a pleasant 63 y.o. female who presents today for evaluation of chronic left shoulder pain with referral from PCP; also has chronic neck and low back pain.  Left shoulder -she has pain, tingling, burning sensation in the left shoulder.  States this is limiting her range of motion.  PCP was concern for adhesive capsulitis.  She did send referral for PT.  Neck and Low back -has a chronic history of these for many years, sees pain management and neurosurgery at [facility], nonsurgical treatment recommended.  See pain medicine as below.  She reports numbness and tingling down both arms and legs, she has severe pain with sitting and walking.  Heat is somewhat helpful.  Chart review from care everywhere shows that it previous chronic compression deformity of L1 on MRI from 2024.  She has a notable past medical history of fibromyalgia, systemic lupus erythematosus with ILD, chronic low back pain, follows with pain management, peripheral arterial disease, thyroid  nodules.  She has seen most of her providers within the [facility] healthcare system.  In terms of pain management, she is managed on a fentanyl  50 mcg patch every 3 days, Lyrica  300 mg twice daily as well as tramadol  50 to 100 mg every 6 hours as needed for pain.  She is also on a daily prednisone  2.5 mg as well as Plaquenil  200 to 400 mg every other day for her SLE/ILD.  Pertinent ROS were reviewed with the patient and found to be negative unless otherwise specified above in HPI.   Assessment & Plan: Visit Diagnoses:  1. Chronic bilateral low back pain with bilateral sciatica   2. Closed compression fracture of L1 vertebra, sequela   3. Neck pain   4. Chronic left  shoulder pain   5. Fibromyalgia   6. Other systemic lupus erythematosus with other organ involvement (HCC)    Plan: Impression is chronic cervical, low back pain which is multifactorial in nature with mild degenerative disc disease as well as rather significant fibromyalgia.  She has previous x-rays and MRI imaging from [facility] which showed a previous L1 compression deformity, updated x-rays today show concern for mild compression deformity of L4, age-indeterminate.  Discussed updating an MRI versus having her follow-up with her back team/neurosurgeon in [facility] for further evaluation and repeat imaging, she is agreeable to this.  She states she has thyroid  nodule evaluation and peripheral arterial disease workup first that she would like to take care of.  I am happy to see her back for the back with consideration of lumbar MRI versus having her see her team at [facility].  She has rather bothersome left shoulder pain, although I do not believe this is adhesive capsulitis as I am able to take her to near full range of motion passively, but she does have gross restriction with active range of motion and associated pain which I believe is emanating more from the subacromial joint/rotator cuff.  Through shared decision making, we did proceed with subacromial shoulder joint injection, patient tolerated well.  For pain control, she is on a hefty regimen from pain management including fentanyl  50 mcg patch every 3 days, Lyrica  300 mg twice daily as well as tramadol  50  to 100 mg every 6 hours as needed for pain which she will continue.  May pursue physical therapy as was previously prescribed by her PCP.  Meds & Orders: No orders of the defined types were placed in this encounter.   Orders Placed This Encounter  Procedures   Large Joint Inj: L subacromial bursa   XR Cervical Spine Complete   XR Lumbar Spine Complete     Procedures: Large Joint Inj: L subacromial bursa on 05/26/2024 4:38 PM Indications: pain Details: 22  G 1.5 in needle, posterior approach Medications: 2 mL lidocaine  1 %; 2 mL bupivacaine  0.25 %; 40 mg methylPREDNISolone  acetate 40 MG/ML Outcome: tolerated well, no immediate complications  Subacromial Joint Injection, Left Shoulder After discussion on risks/benefits/indications, informed verbal consent was obtained. A timeout was then performed. Patient was seated on table in exam room. The patient's shoulder was prepped with betadine and alcohol swabs and utilizing posterior approach a 22G, 1.5 needle was directed anteriorly and laterally into the patient's subacromial space was injected with 2:2:1 mixture of lidocaine :bupivicaine:depomedrol with appreciation of free-flowing of the injectate into the bursal space. Patient tolerated the procedure well without immediate complications.   Procedure, treatment alternatives, risks and benefits explained, specific risks discussed. Consent was given by the patient. Immediately prior to procedure a time out was called to verify the correct patient, procedure, equipment, support staff and site/side marked as required. Patient was prepped and draped in the usual sterile fashion.          Clinical History: No specialty comments available.  She reports that she quit smoking about 19 years ago. Her smoking use included cigarettes. She started smoking about 39 years ago. She has a 40 pack-year smoking history. She has never used smokeless tobacco. No results for input(s): HGBA1C, LABURIC in the last 8760 hours.  Objective:    Physical Exam  Gen: Well-appearing, in no acute distress; non-toxic CV: Well-perfused. Warm.  Resp: Breathing unlabored on room air; no wheezing. Psych: Fluid speech in conversation; appropriate affect; normal thought process  Ortho Exam - Cervical: No midline spinous process TTP, there is generalized tenderness in the soft tissue with multiple trigger points.  Negative Spurling's test.  - Left shoulder: There is generalized  tenderness palpating throughout the entirety of the shoulder.  There is restricted active range of motion in all planes with inability to perform flexion or abduction past 80 degrees, I am able to take her to near full range of motion passively with distraction.  - Lumbar: Patient with thoracolumbar kyphotic posture.  Patient has limited mobility with ambulation and a walker and a wheelchair on examination today.  There is tenderness both in the midline of the lumbar spine as well as surrounding paraspinal musculature.  Imaging:  XR Lumbar Spine Complete 3 views of the lumbar spine including AP, lateral, flexion view was  ordered and reviewed by myself today.  X-rays demonstrate 5 nonrib-bearing  lumbar vertebrae.  There is mild arthritic change and lumbar DDD.  There  are age-indeterminate L1 compression deformity with loss of disc height as  well as concern for mild L4 compression deformity, age-indeterminate. XR Cervical Spine Complete 3 views of the cervical spine including AP, lateral, flexion views were  ordered and reviewed by myself today.  X-rays demonstrate mild  degenerative disc disease.  No acute fracture noted.    **Did review cervical and lumbar MRI reports from outside facility today during the office visit.  *MRI CERVICAL SPINE WITHOUT CONTRAST   INDICATION:  Spinal stenosis, cervical, Cervical radiculopathy, M48.061  Spinal stenosis, lumbar region without neurogenic claudication, M54.16  Radiculopathy, lumbar region, M54.12 Radiculopathy, cervical region,  R29.898 Other symptoms and signs involving the musculoskeletal system,  R29.898 Other symptoms and signs involving the musculoskeletal system,  M48.02 Spinal stenosis, cervical region   COMPARISON: 04/09/2019   TECHNIQUE/PROTOCOL: Standard cervical spine extradural protocol MR  performed without contrast.   LIMITATIONS: Examination is mildly limited due to motion artifact on T2  axial sequence.   FINDINGS:   Prior surgery: None.   Spinal cord: No compression.  Normal in size and signal.  Bone marrow Signal: No suspicious lesions. Stable mild compression  deformity of the T3 vertebral body compared to prior MRI 04/09/2019.  Alignment: No traumatic listhesis. Straightening of the normal cervical  lordosis.   Skull base: The partially visualized skull base is unremarkable.    Soft tissues: There is a lymph node in the left lateral neck that measures  approximately 1.0 cm in greatest dimension and appears to have cystic  change. This is new when compared to prior MRI dated April 09, 2019.   Additionally, there appears to be a cystic node in the right lateral neck  that measures approximately 1.1 cm.  Degenerative changes:  Occiput-C1: No significant degenerative change.  Atlanto-dental interval: Normal.  C1-2 lateral masses: No significant degenerative change.   C2-C3: No significant stenosis.  C3-C4: Tiny disc bulge with annular fissuring, no spinal canal stenosis or  neural foraminal narrowing.  C4-C5: Tiny disc bulge and annular fissuring and mild bilateral  uncovertebral hypertrophy and facet arthrosis without spinal canal stenosis  or neural foraminal narrowing.  C5-C6: Small disc bulge and right greater left uncovertebral hypertrophy  and facet arthrosis with resultant moderate bilateral neural foraminal  narrowing.  C6-C7: Small disc bulge and mild bilateral facet arthrosis with mild spinal  canal stenosis. There is a moderate sized perineural cyst in the right C6-7  foramen  C7-T1: No significant stenosis. Small perineural cyst in the right C7-T1  neural foramen.    IMPRESSION:  1. Multilevel degenerative changes of the cervical spine worse at C6-7  with resultant mild spinal canal stenosis.  2.  Moderate bilateral C5-6 neural foraminal narrowing.  3. Small but cystic appearing lymph nodes in the neck bilaterally. Given  location in the lower neck, there is concern concern  for possible  metastatic disease. Occult thyroid  carcinoma would be considered. Further  evaluation with thyroid  ultrasound that includes limited evaluation of the  lower neck bilaterally is recommended. Alternatively CT soft tissue neck  with contrast might be useful for further evaluation of these nodes.   *MRI LUMBAR SPINE WITHOUT CONTRAST   INDICATION: LBP >6 wks. Pain or radicular symptoms, symptoms persist with > 6wks conservative treatment, M48.061 Spinal stenosis, lumbar region without  neurogenic claudication, M54.16 Radiculopathy, lumbar region, M54.12  Radiculopathy, cervical region, R29.898 Other symptoms and signs involving  the musculoskeletal system, R29.898 Other symptoms and signs involving the  musculoskeletal system   COMPARISON: 03/26/2019.   TECHNIQUE/PROTOCOL: Extradural protocol MRI of the lumbar spine was  performed without contrast administration.   FINDINGS:  Anatomical variants: Conventional spinal numbering.  Prior surgery: None.   Conus medullaris: terminates at approximately L1-2.  Spinal Cord and Cauda Equina: Visualized portions of the spinal cord is  normal in morphology and signal. Normal appearance of the cauda equina.  Multiple incidental Tarlov cysts at S1 and S2.   Alignment: No traumatic listhesis.  Bone marrow signal: No suspicious lesions.  Unchanged mild compression  deformities of the L1 and L4 vertebral bodies compared to prior MRI  03/26/2019.   Regional soft tissues: Multiple T2 hyperintense lesions in the kidneys are  incompletely characterized but favored to represent benign renal cysts.    Degenerative changes:  T12-L1: Unremarkable.    L1-L2: Mild bilateral facet arthrosis without spinal canal stenosis or  neural foraminal narrowing.  L2-L3: Moderate bilateral facet arthrosis without spinal canal stenosis or  neural foraminal narrowing.    L3-L4: Small posterior disc osteophyte and moderate bilateral facet  arthrosis without  high grade spinal canal stenosis or neural foraminal  narrowing.    L4-L5: Small disc bulge and annular fissuring with mild bilateral facet  arthrosis results in mild right neural foraminal narrowing.    L5-S1: Unremarkable.     Sacroiliac joints: No significant degenerative changes of the visualized SI  joints.    IMPRESSION:  1. Small disc bulge with annular fissuring at L4-5 which can be a source  of pain.  2.  Otherwise mild degenerative disc disease of the lumbar spine without  high-grade spinal canal stenosis or neural foraminal narrowing.  3.   Mild chronic compression deformity of L1.  4.  No significant interval change when compared to March 26, 2019.   Past Medical/Family/Surgical/Social History: Medications & Allergies reviewed per EMR, new medications updated. Patient Active Problem List   Diagnosis Date Noted   Chronic hypoxic respiratory failure (HCC)    Hydronephrosis with renal and ureteral calculus obstruction 10/05/2018   Acute pyelonephritis 09/30/2018   Vitamin D  deficiency 03/19/2018   SLE (systemic lupus erythematosus) (HCC) 12/12/2017   Poor appetite 05/23/2017   Blurry vision, bilateral 06/08/2016   Abdominal pain, chronic, epigastric 02/17/2016   Other type of migraine 06/28/2015   Colon cancer screening 06/28/2015   Encounter for screening mammogram for breast cancer 06/28/2015   Dyslipidemia 06/28/2015   Chronic kidney disease 12/28/2014   Midline low back pain without sciatica 12/14/2014   Dental caries 12/14/2014   Numbness and tingling 12/14/2014   Gastroesophageal reflux disease without esophagitis 12/14/2014   Headache due to trauma 08/24/2014   Seizures (HCC) 06/02/2014   Chronic diastolic heart failure (HCC) 05/30/2014   Cryptogenic organizing pneumonia (HCC) 05/30/2014   Common migraine with intractable migraine 05/18/2014   Headache 04/28/2014   Nocturnal hypoxemia 03/16/2014   Dyspnea 01/06/2014   Post-op pain 11/21/2013    Interstitial lung disease (HCC) 11/02/2013   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   Altered mental status 10/22/2013   Anemia of chronic disease 10/15/2013   Hematuria 10/15/2013   History of renal calculi, multiple 10/15/2013   Abdominal pain, epigastric 10/15/2013   Nausea 10/15/2013   Seizure disorder (HCC) 10/14/2013   Fibromyalgia 10/14/2013   Chronic pain 10/05/2013   Past Medical History:  Diagnosis Date   Anemia    CHF (congestive heart failure) (HCC)    Chronic kidney disease    Chronic pain    COPD (chronic obstructive pulmonary disease) (HCC)    Fibromyalgia    Foley catheter in place    History of kidney stones    Migraine    Migraine without aura, with intractable migraine, so stated, without mention of status migrainosus 05/18/2014   Oxygen dependent    as needed 2L at hs per patient    Peripheral vascular disease (HCC)    Pneumonia    PONV (postoperative nausea and vomiting)    Pulmonary fibrosis (HCC)    Seizures (HCC)  SLE (systemic lupus erythematosus) (HCC) 12/12/2017   UTI (urinary tract infection) 09/30/2018   Family History  Problem Relation Age of Onset   Hypertension Mother    Hepatitis C Mother    Diabetes Father    Hypertension Father    Multiple sclerosis Sister    Bipolar disorder Brother    Breast cancer Neg Hx    Past Surgical History:  Procedure Laterality Date   ABDOMINAL HYSTERECTOMY     CHOLECYSTECTOMY     CYSTOSCOPY W/ URETERAL STENT PLACEMENT Left 10/03/2018   Procedure: CYSTOSCOPY WITH RETROGRADE PYELOGRAM/URETERAL STENT PLACEMENT;  Surgeon: Summit Cough, MD;  Location: [facility];  Service: Urology;  Laterality: Left;   CYSTOSCOPY/URETEROSCOPY/HOLMIUM LASER/STENT PLACEMENT Left 11/05/2018   Procedure: CYSTOSCOPY/URETEROSCOPY/HOLMIUM LASER/STENT EXCHANGE;  Surgeon: Talah Cough, MD;  Location: [facility];  Service: Urology;  Laterality: Left;  ONLY NEEDS 60 MIN   EYE SURGERY     bilateral  cataract surgery    Kidney stome surgery  2012   Left lung biopsy  2015   LUNG BIOPSY Left 11/21/2013   Procedure: LUNG BIOPSY;  Surgeon: Dallas KATHEE Jude, MD;  Location: [facility] OR;  Service: Thoracic;  Laterality: Left;   VIDEO ASSISTED THORACOSCOPY Left 11/21/2013   Procedure: VIDEO ASSISTED THORACOSCOPY;  Surgeon: Dallas KATHEE Jude, MD;  Location[facility] OR;  Service: Thoracic;  Laterality: Left;   VIDEO BRONCHOSCOPY N/A 11/21/2013   Procedure: VIDEO BRONCHOSCOPY;  Surgeon: Dallas KATHEE Jude, MD;  Location: [facility] OR;  Service: Thoracic;  Laterality: N/A;   Social History   Occupational History   Occupation: unemployed  Tobacco Use   Smoking status: Former    Current packs/day: 0.00    Average packs/day: 2.0 packs/day for 20.0 years (40.0 ttl pk-yrs)    Types: Cigarettes    Start date: 04/17/1985    Quit date: 04/17/2005    Years since quitting: 19.1   Smokeless tobacco: Never  Vaping Use   Vaping status: Never Used  Substance and Sexual Activity   Alcohol use: No   Drug use: No   Sexual activity: Never

## 2024-05-26 NOTE — Progress Notes (Signed)
 Patient says that she has had pain in her back and neck for a couple of months. She says that she has shooting pain down both arms and both legs, as well as numbness and tingling down both arms and legs. She is taking several medications for her pain, but she has severe pain with sitting and walking. She sleeps in a recliner both for her back and for her lungs. She says that heat does give her some relief of pain.

## 2024-06-12 ENCOUNTER — Institutional Professional Consult (permissible substitution) (INDEPENDENT_AMBULATORY_CARE_PROVIDER_SITE_OTHER): Payer: MEDICAID | Admitting: Otolaryngology

## 2024-07-21 ENCOUNTER — Encounter: Payer: Self-pay | Admitting: Radiology

## 2024-07-22 ENCOUNTER — Other Ambulatory Visit: Payer: Self-pay | Admitting: Family Medicine

## 2024-09-09 ENCOUNTER — Telehealth: Payer: Self-pay | Admitting: Pharmacist

## 2024-09-09 ENCOUNTER — Other Ambulatory Visit (HOSPITAL_COMMUNITY): Payer: Self-pay

## 2024-09-09 NOTE — Telephone Encounter (Signed)
 Pharmacy Patient Advocate Encounter  Received notification from [facility] MEDICAID that Prior Authorization for Rimegepant Sulfate (NURTEC) 75 MG TBDP has been DENIED.  Full denial letter will be uploaded to the media tab. See denial reason below.   PA #/Case ID/Reference #: [id number]   Denied due to questions that were not asked in original PA.  An appeal will be submitted and documented in a separate telephone encounter.

## 2024-09-09 NOTE — Telephone Encounter (Signed)
 Pharmacy Patient Advocate Encounter   Received notification from Patient Pharmacy that prior authorization for Nurtec 75MG  dispersible tablets is required/requested.   Insurance verification completed.   The patient is insured through [facility] MEDICAID.   Per test claim: PA required; PA submitted to above mentioned insurance via Latent Key/confirmation #/EOC [id number] Status is pending

## 2024-09-09 NOTE — Telephone Encounter (Signed)
 Appeal has been submitted. Will advise when response is received, please be advised that most companies may take 30 days to make a decision. Appeal letter and supporting documentation have been faxed to [phone] on 09/09/2024 @12 :36 pm.  Thank you, Devere Pandy, PharmD Clinical Pharmacist  [place]  Direct Dial: [phone]

## 2024-09-16 ENCOUNTER — Telehealth: Payer: Self-pay | Admitting: Family Medicine

## 2024-09-16 NOTE — Telephone Encounter (Unsigned)
 Copied from CRM #[id number]. Topic: Clinical - Medication Refill >> Sep 16, 2024  1:02 PM Ameerah G wrote: Medication: midodrine  (PROAMATINE ) 5 MG tablet [[id number]] rosuvastatin  (CRESTOR ) 20 MG tablet [[id number]]  Has the patient contacted their pharmacy? Yes (Agent: If no, request that the patient contact the pharmacy for the refill. If patient does not wish to contact the pharmacy document the reason why and proceed with request.) (Agent: If yes, when and what did the pharmacy advise?)  This is the patient's preferred pharmacy:  [id number] - [place], [place] - [address] AT [facility] OF [address] [place] [address] Phone: [phone] Fax: [phone]  Is this the correct pharmacy for this prescription? Yes If no, delete pharmacy and type the correct one.   Has the prescription been filled recently? No  Is the patient out of the medication? No  Has the patient been seen for an appointment in the last year OR does the patient have an upcoming appointment? Yes  Can we respond through MyChart? Yes  Agent: Please be advised that Rx refills may take up to 3 business days. We ask that you follow-up with your pharmacy.

## 2024-09-16 NOTE — Telephone Encounter (Signed)
 I called the patient has been notified than an appeal has been submitted. Once we get more information we will call the patient back. She received a letter that she has not had over 50 migraines/ headaches and that is the reason her nurtec was denied. She was informed it will take at least 30 days and it was submitted on 09/09/24.

## 2024-09-16 NOTE — Telephone Encounter (Signed)
 Pt called and is needing to speak to the nurse regarding the denial of her Nurtec. Pt states she is doing better but not that good to have to stop taking the Nurtec. Please advise.

## 2024-09-16 NOTE — Telephone Encounter (Addendum)
 Hi Altamese,  Patient stated she needs a new referral for ophthalmology, can you please help with this.

## 2024-09-16 NOTE — Telephone Encounter (Signed)
 Copied from CRM [id number]. Topic: Referral - Question >> Sep 16, 2024 12:58 PM Avram MATSU wrote:  Reason for CRM: patient stated she news a new referral for ophthalmology. Stated she has issues with the last location she was referred to. Please advise [phone]

## 2024-09-17 NOTE — Telephone Encounter (Signed)
 I contacted the patient and scheduled her for an OV to obtain a new referral.   Thank you, Happy New Year to you as well!

## 2024-09-23 ENCOUNTER — Telehealth: Payer: Self-pay | Admitting: Family Medicine

## 2024-09-23 DIAGNOSIS — H539 Unspecified visual disturbance: Secondary | ICD-10-CM

## 2024-09-23 NOTE — Telephone Encounter (Signed)
 Hi Alycia,   Please confirm if appointment can be virtual.

## 2024-09-23 NOTE — Telephone Encounter (Signed)
 Patient is needing new referral for eye doctor.

## 2024-09-23 NOTE — Telephone Encounter (Signed)
 Copied from CRM [id number]. Topic: Appointments - Scheduling Inquiry for Clinic >> Sep 23, 2024 10:49 AM Harlene ORN wrote:  Reason for CRM: Patient called. Requesting to have her 01/08 appointment with Dr. Ronal Caldron Placey be switched to a virtual visit, as she is not able to physically make it to the appointment. Was instructed to send this crm to the admin pool and someone will call her back to schedule by the end of the day.

## 2024-09-24 ENCOUNTER — Telehealth: Payer: Self-pay | Admitting: Family Medicine

## 2024-09-24 NOTE — Telephone Encounter (Signed)
 Pt called to follow up to see if this appt could be changed to a phone visit or video visit. I am just following up since appt is tomorrow. Please advise pt.

## 2024-09-24 NOTE — Telephone Encounter (Signed)
 Pt confirmed appt (per vr)

## 2024-09-24 NOTE — Telephone Encounter (Signed)
 Referral has been placed.

## 2024-09-24 NOTE — Addendum Note (Signed)
 Addended by: Josejuan Hoaglin on: 09/24/2024 12:33 PM   Modules accepted: Orders

## 2024-09-24 NOTE — Telephone Encounter (Signed)
 Please advise

## 2024-09-24 NOTE — Telephone Encounter (Unsigned)
 Copied from CRM [id number]. Topic: Referral - Question >> Sep 24, 2024  3:08 PM Tinnie BROCKS wrote: Reason for CRM: Pt wants to make sure the referral is sent to somewhere in [place].

## 2024-09-25 ENCOUNTER — Ambulatory Visit: Payer: Self-pay | Admitting: *Deleted

## 2024-09-25 NOTE — Telephone Encounter (Signed)
 Patient has been informed.

## 2024-09-26 ENCOUNTER — Other Ambulatory Visit: Payer: Self-pay | Admitting: Family Medicine

## 2024-09-26 ENCOUNTER — Ambulatory Visit: Payer: Self-pay

## 2024-09-26 DIAGNOSIS — K2100 Gastro-esophageal reflux disease with esophagitis, without bleeding: Secondary | ICD-10-CM

## 2024-09-26 MED ORDER — PANTOPRAZOLE SODIUM 40 MG PO TBEC: 40.0000 mg | DELAYED_RELEASE_TABLET | Freq: Two times a day (BID) | ORAL | 0 refills | Status: DC

## 2024-09-26 NOTE — Telephone Encounter (Signed)
 Medication has been refilled.

## 2024-09-26 NOTE — Telephone Encounter (Signed)
 FYI Only or Action Required?: FYI only for provider: refill .  Patient was last seen in primary care on 04/21/2024 by Newlin, Enobong, MD.  Called Nurse Triage reporting Heartburn.  Symptoms began ongoing .  Interventions attempted: OTC medications: TUMS and Rest, hydration, or home remedies.  Symptoms are: gradually worsening.  Triage Disposition: See PCP Within 2 Weeks  Patient/caregiver understands and will follow disposition?: No, wishes to speak with PCP  Copied from CRM #[id number]. Topic: Clinical - Red Word Triage >> Sep 26, 2024  2:48 PM Tinnie BROCKS wrote: Red Word that prompted transfer to Nurse Triage: Requesting to speak with a nurse regarding pain at top of stomach with acid reflux and heartburn. She has tried two things over the counter including Tums that has not helped. Refill request sent for pantoprozole 40mg  - pt only has one left. Reason for Disposition  Abdominal pain is a chronic symptom (recurrent or ongoing AND present > 4 weeks)  Answer Assessment - Initial Assessment Questions Abdominal discomfort- when she takes her medicine, about later she starts to feel in the middle and then it goes all around her stomach. Acid reflux- acid taste burns the back of her throat- Protonix  calms down everything. TUMS doesn't help  Thinks that when her migraines got worse and changed her medication from muscle relaxer to Nurtec and a month injection.   Has one tablet of Protonix  left and looking to get it refilled. Urgent request placed through [facility] refills to get addressed. Patient grateful and will look out for notification from pharmacy. Pharmacy has had issues with filling her rosuvastatin  due to insurance. Advised to reach out to insurance to clarify the issue as she has a refill left.   1. LOCATION: Where does it hurt?      Middle of abdomen  2. RADIATION: Does the pain shoot anywhere else? (e.g., chest, back)     All around her stomach  3. ONSET: When did the pain  begin? (e.g., minutes, hours or days ago)      Stomach pains getting worse  4. SUDDEN: Gradual or sudden onset?     Comes on suddenly about after medication and then resolves 5. PATTERN Does the pain come and go, or is it constant?     Comes and goes  6. SEVERITY: How bad is the pain?  (e.g., Scale 1-10; mild, moderate, or severe)     6/10- took protonix ,  7. RECURRENT SYMPTOM: Have you ever had this type of stomach pain before? If Yes, ask: When was the last time? and What happened that time?      When she takes her medications  8. AGGRAVATING FACTORS: Does anything seem to cause this pain? (e.g., foods, stress, alcohol)     Medications  9. CARDIAC SYMPTOMS: Do you have any of the following symptoms: chest pain, difficulty breathing, sweating, nausea?     Migraines, HF  10. OTHER SYMPTOMS: Do you have any other symptoms? (e.g., back pain, diarrhea, fever, urination pain, vomiting)       Nausea  Protocols used: Abdominal Pain - Upper-A-AH

## 2024-09-26 NOTE — Telephone Encounter (Unsigned)
 Copied from CRM [id number]. Topic: Clinical - Medication Refill >> Sep 26, 2024  2:42 PM Lauren C wrote: Medication: pantoprazole  (PROTONIX ) 40 MG tablet   Has the patient contacted their pharmacy? No (Agent: If no, request that the patient contact the pharmacy for the refill. If patient does not wish to contact the pharmacy document the reason why and proceed with request.) (Agent: If yes, when and what did the pharmacy advise?)  This is the patient's preferred pharmacy:  [id number] - [place], [place] - [address] AT [facility] OF [address] [place]  [address] Phone: [phone] Fax: [phone]  Is this the correct pharmacy for this prescription? No If no, delete pharmacy and type the correct one.   Has the prescription been filled recently? Yes  Is the patient out of the medication? One left  Has the patient been seen for an appointment in the last year OR does the patient have an upcoming appointment? Yes  Can we respond through MyChart? No, #[phone]  Agent: Please be advised that Rx refills may take up to 3 business days. We ask that you follow-up with your pharmacy.

## 2024-09-26 NOTE — Telephone Encounter (Unsigned)
 Copied from CRM #[id number]. Topic: Clinical - Medication Refill >> Sep 26, 2024  2:44 PM Lauren C wrote: Medication: rosuvastatin  (CRESTOR ) 20 MG tablet   Has the patient contacted their pharmacy? No (Agent: If no, request that the patient contact the pharmacy for the refill. If patient does not wish to contact the pharmacy document the reason why and proceed with request.) (Agent: If yes, when and what did the pharmacy advise?)  This is the patient's preferred pharmacy:  [id number] - [place], [place] - [address] AT [facility] OF [address] [place] [address] Phone: [phone] Fax: [phone]  Is this the correct pharmacy for this prescription? Yes If no, delete pharmacy and type the correct one.   Has the prescription been filled recently? Yes  Is the patient out of the medication? No  Has the patient been seen for an appointment in the last year OR does the patient have an upcoming appointment? Yes  Can we respond through MyChart? Please call [phone]  Agent: Please be advised that Rx refills may take up to 3 business days. We ask that you follow-up with your pharmacy.

## 2024-09-29 ENCOUNTER — Other Ambulatory Visit (HOSPITAL_COMMUNITY): Payer: Self-pay

## 2024-09-29 NOTE — Telephone Encounter (Signed)
 Too soon for refill.  Requested Prescriptions  Pending Prescriptions Disp Refills   rosuvastatin  (CRESTOR ) 20 MG tablet 90 tablet 1     Cardiovascular:  Antilipid - Statins 2 Failed - 09/29/2024  1:18 PM      Failed - Cr in normal range and within 360 days    Creat  Date Value Ref Range Status  09/27/2016 1.27 (H) 0.50 - 1.05 mg/dL Final    Comment:      For patients > or = 63 years of age: The upper reference limit for Creatinine is approximately 13% higher for people identified as African-American.      Creatinine, Ser  Date Value Ref Range Status  12/13/2022 1.30 (H) 0.57 - 1.00 mg/dL Final   Creatinine, Urine  Date Value Ref Range Status  10/26/2013 121.76 mg/dL Final         Failed - Lipid Panel in normal range within the last 12 months    Cholesterol, Total  Date Value Ref Range Status  11/22/2021 250 (H) 100 - 199 mg/dL Final   LDL Chol Calc (NIH)  Date Value Ref Range Status  11/22/2021 173 (H) 0 - 99 mg/dL Final   HDL  Date Value Ref Range Status  11/22/2021 53 >39 mg/dL Final   Triglycerides  Date Value Ref Range Status  11/22/2021 134 0 - 149 mg/dL Final         Passed - Patient is not pregnant      Passed - Valid encounter within last 12 months    Recent Outpatient Visits           5 months ago Burning with urination   [facility] [place]. [facility] Delbert Clam, MD   8 months ago Thyroid  nodule   [facility] [place]. [facility] Delbert Clam, MD   1 year ago Pain, dental   [facility] [place]. [facility] Delbert Clam, MD   2 years ago Other insomnia   [facility] [place]. 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 [facility]  Delbert Clam, MD              Signed Prescriptions Disp Refills   pantoprazole  (PROTONIX ) 40 MG tablet 60 tablet 0    Sig: Take 1 tablet (40 mg total) by mouth 2 (two) times daily.     There is no refill protocol information for this order

## 2024-10-01 ENCOUNTER — Telehealth: Payer: Self-pay | Admitting: Family Medicine

## 2024-10-01 NOTE — Telephone Encounter (Signed)
 Contacted patient and provided the referral information.  Sent Referral to   [facility]   [address] [place], [place] [address] Phone: [phone] Fax# [phone] / [phone]

## 2024-10-01 NOTE — Telephone Encounter (Signed)
 Copied from CRM [id number]. Topic: Referral - Question >> Oct 01, 2024 11:18 AM   Revonda D wrote:  Reason for CRM: Pt is requesting to speak with someone in regards to her Ophthalmology referral. Pt doesn't understand why the referral was closed and never received a call to get an appt scheduled. Pt wants to have the referral resubmitted and wants to go to a location in [place]. Pt would like a callback today to discuss this concern.

## 2024-10-07 ENCOUNTER — Other Ambulatory Visit (HOSPITAL_COMMUNITY): Payer: Self-pay

## 2024-10-08 NOTE — Telephone Encounter (Signed)
 Pt has called to report that last Thursday was the last she had of the Nurtec, she has not been contacted with any updates, please advise.

## 2024-10-08 NOTE — Telephone Encounter (Signed)
 Any update on the appeal for nurtec

## 2024-10-08 NOTE — Telephone Encounter (Signed)
 Nurtec PA was denied and patient does not have anymore pills. Please advise on alternative.

## 2024-10-09 ENCOUNTER — Other Ambulatory Visit (HOSPITAL_COMMUNITY): Payer: Self-pay

## 2024-10-14 ENCOUNTER — Encounter: Payer: Self-pay | Admitting: Pharmacist

## 2024-10-14 ENCOUNTER — Other Ambulatory Visit (HOSPITAL_COMMUNITY): Payer: Self-pay

## 2024-10-14 NOTE — Telephone Encounter (Signed)
 Appeal for Nurtec has been approved by [employer], full letter can be found under the media tab.    Patient notified via fpl group.  Thank you, Devere Pandy, PharmD Clinical Pharmacist  [place]  Direct Dial: [phone]

## 2024-10-14 NOTE — Telephone Encounter (Signed)
 Per insurance, a decision will be made by 11/13/2024

## 2024-10-16 ENCOUNTER — Other Ambulatory Visit (HOSPITAL_COMMUNITY): Payer: Self-pay

## 2024-10-16 NOTE — Telephone Encounter (Signed)
 Per the PA team:  Dear [name] [name],    The prior authorization for your Nurtec has been approved. You can now contact your preferred pharmacy to coordinate pick up of your medication.    Thanks,    Med Access Team     Message was sent to the patient via Mychart and I called patient and she plans to pick up medication on 10/17/24 per her call with the pharmacy

## 2024-10-22 ENCOUNTER — Other Ambulatory Visit: Payer: Self-pay | Admitting: Family Medicine

## 2024-10-22 ENCOUNTER — Ambulatory Visit: Payer: MEDICAID | Admitting: Family Medicine

## 2024-10-22 ENCOUNTER — Encounter: Payer: Self-pay | Admitting: Family Medicine

## 2024-10-22 VITALS — BP 134/83 | HR 57 | Ht 61.0 in | Wt 155.8 lb

## 2024-10-22 DIAGNOSIS — J849 Interstitial pulmonary disease, unspecified: Secondary | ICD-10-CM

## 2024-10-22 DIAGNOSIS — N644 Mastodynia: Secondary | ICD-10-CM

## 2024-10-22 DIAGNOSIS — R600 Localized edema: Secondary | ICD-10-CM | POA: Diagnosis not present

## 2024-10-22 DIAGNOSIS — I959 Hypotension, unspecified: Secondary | ICD-10-CM

## 2024-10-22 DIAGNOSIS — E785 Hyperlipidemia, unspecified: Secondary | ICD-10-CM

## 2024-10-22 DIAGNOSIS — K2100 Gastro-esophageal reflux disease with esophagitis, without bleeding: Secondary | ICD-10-CM | POA: Diagnosis not present

## 2024-10-22 DIAGNOSIS — E7800 Pure hypercholesterolemia, unspecified: Secondary | ICD-10-CM

## 2024-10-22 DIAGNOSIS — M3219 Other organ or system involvement in systemic lupus erythematosus: Secondary | ICD-10-CM

## 2024-10-22 DIAGNOSIS — Z131 Encounter for screening for diabetes mellitus: Secondary | ICD-10-CM

## 2024-10-22 DIAGNOSIS — Z23 Encounter for immunization: Secondary | ICD-10-CM | POA: Diagnosis not present

## 2024-10-22 DIAGNOSIS — G894 Chronic pain syndrome: Secondary | ICD-10-CM

## 2024-10-22 DIAGNOSIS — Z1231 Encounter for screening mammogram for malignant neoplasm of breast: Secondary | ICD-10-CM

## 2024-10-22 MED ORDER — FUROSEMIDE 20 MG PO TABS: ORAL_TABLET | ORAL | 1 refills | Status: AC

## 2024-10-22 MED ORDER — MIDODRINE HCL 5 MG PO TABS: 5.0000 mg | ORAL_TABLET | Freq: Every day | ORAL | 1 refills | Status: AC

## 2024-10-22 MED ORDER — PANTOPRAZOLE SODIUM 40 MG PO TBEC: 40.0000 mg | DELAYED_RELEASE_TABLET | Freq: Two times a day (BID) | ORAL | 1 refills | Status: AC

## 2024-10-22 MED ORDER — ROSUVASTATIN CALCIUM 20 MG PO TABS: 20.0000 mg | ORAL_TABLET | Freq: Every day | ORAL | 1 refills | Status: AC

## 2024-10-22 NOTE — Progress Notes (Signed)
 "  Subjective:  Patient ID: [name] [name], female    DOB: Mar 12, 1961  Age: 64 y.o. MRN: [id number]  CC: Medical Management of Chronic Issues     Discussed the use of AI scribe software for clinical note transcription with the patient, who gave verbal consent to proceed.  History of Present Illness [name] [name] is a 63 year old female with Fibromyalgia, systemic lupus erythematosus with interstitial lung disease,  chronic respiratory failure (on 2L at night), chronic low back pain (followed by pain management), GERD, seizures, migraines who , history of cystoscopy, left ureteroscopy with laser lithotripsy and stent exchange, peripheral arterial disease, s/pTAH in the 90s (secondary to ?Endometrial ca in [name]), thyroid  nodules and lymphadenopathy  who presents for follow-up and management of her multiple medical conditions.  She has interstitial lung disease monitored at [facility], with labs scheduled later this month. Lung imaging was done last year.  She has thyroid  nodules with lymphadenopathy that have been evaluated with imaging and aspiration at [facility].  This is being monitored and she is followed by ENT.  She has lupus managed by rheumatologist Dr. Sharl and chronic pain under pain management. Lodine  500 mg was stopped after kidney issues and difficulty urinating. She is not currently followed by nephrology after losing contact information for [facility].  She takes midodrine  for low blood pressure, furosemide  for swelling, pantoprazole  for acid reflux, and a cholesterol medication managed by this clinic. She has high cholesterol.  She is undergoing eye monitoring related to hydroxychloroquine  use. She is overdue for mammogram and has not yet received flu or pneumonia vaccines.  She has a planned vascular surgery due to her PAD at [facility] with Dr. Helaine that she has delayed scheduling because of multiple ongoing medical appointments.    Past Medical History:   Diagnosis Date   Anemia    CHF (congestive heart failure) (HCC)    Chronic kidney disease    Chronic pain    COPD (chronic obstructive pulmonary disease) (HCC)    Fibromyalgia    Foley catheter in place    History of kidney stones    Migraine    Migraine without aura, with intractable migraine, so stated, without mention of status migrainosus 05/18/2014   Oxygen dependent    as needed 2L at hs per patient    Peripheral vascular disease    Pneumonia    PONV (postoperative nausea and vomiting)    Pulmonary fibrosis (HCC)    Seizures (HCC)    SLE (systemic lupus erythematosus) (HCC) 12/12/2017   UTI (urinary tract infection) 09/30/2018    Past Surgical History:  Procedure Laterality Date   ABDOMINAL HYSTERECTOMY     CHOLECYSTECTOMY     CYSTOSCOPY W/ URETERAL STENT PLACEMENT Left 10/03/2018   Procedure: CYSTOSCOPY WITH RETROGRADE PYELOGRAM/URETERAL STENT PLACEMENT;  Surgeon: Yennifer Cough, MD;  Location: [facility] ORS;  Service: Urology;  Laterality: Left;   CYSTOSCOPY/URETEROSCOPY/HOLMIUM LASER/STENT PLACEMENT Left 11/05/2018   Procedure: CYSTOSCOPY/URETEROSCOPY/HOLMIUM LASER/STENT EXCHANGE;  Surgeon: Teresha Cough, MD;  Location: [facility];  Service: Urology;  Laterality: Left;  ONLY NEEDS 60 MIN   EYE SURGERY     bilateral cataract surgery    Kidney stome surgery  2012   Left lung biopsy  2015   LUNG BIOPSY Left 11/21/2013   Procedure: LUNG BIOPSY;  Surgeon: Dallas KATHEE Jude, MD;  Location: [facility] OR;  Service: Thoracic;  Laterality: Left;   VIDEO ASSISTED THORACOSCOPY Left 11/21/2013   Procedure: VIDEO ASSISTED THORACOSCOPY;  Surgeon: Dallas KATHEE Jude,  MD;  Location: [facility] OR;  Service: Thoracic;  Laterality: Left;   VIDEO BRONCHOSCOPY N/A 11/21/2013   Procedure: VIDEO BRONCHOSCOPY;  Surgeon: Dallas KATHEE Jude, MD;  Location: [facility] OR;  Service: Thoracic;  Laterality: N/A;    Family History  Problem Relation Age of Onset   Hypertension Mother    Hepatitis C Mother    Diabetes Father     Hypertension Father    Multiple sclerosis Sister    Bipolar disorder Brother    Breast cancer Neg Hx     Social History   Socioeconomic History   Marital status: Single    Spouse name: Not on file   Number of children: 0   Years of education: college 3   Highest education level: Some college, no degree  Occupational History   Occupation: unemployed  Tobacco Use   Smoking status: Former    Current packs/day: 0.00    Average packs/day: 2.0 packs/day for 20.0 years (40.0 ttl pk-yrs)    Types: Cigarettes    Start date: 04/17/1985    Quit date: 04/17/2005    Years since quitting: 19.5   Smokeless tobacco: Never  Vaping Use   Vaping status: Never Used  Substance and Sexual Activity   Alcohol use: No   Drug use: No   Sexual activity: Never  Other Topics Concern   Not on file  Social History Narrative   Not on file   Social Drivers of Health   Tobacco Use: Medium Risk (10/22/2024)   Patient History    Smoking Tobacco Use: Former    Smokeless Tobacco Use: Never    Passive Exposure: Not on Actuary Strain: High Risk (04/17/2024)   Overall Financial Resource Strain (CARDIA)    Difficulty of Paying Living Expenses: Very hard  Food Insecurity: Patient Declined (04/17/2024)   Epic    Worried About Programme Researcher, Broadcasting/film/video in the Last Year: Patient declined    Barista in the Last Year: Patient declined  Transportation Needs: Patient Declined (04/17/2024)   Epic    Lack of Transportation (Medical): Patient declined    Lack of Transportation (Non-Medical): Patient declined  Physical Activity: Inactive (04/17/2024)   Exercise Vital Sign    Days of Exercise per Week: 0 days    Minutes of Exercise per Session: Not on file  Stress: No Stress Concern Present (04/17/2024)   [employer] of Occupational Health - Occupational Stress Questionnaire    Feeling of Stress: Not at all  Social Connections: Unknown (04/17/2024)   Social Connection and Isolation Panel     Frequency of Communication with Friends and Family: More than three times a week    Frequency of Social Gatherings with Friends and Family: Patient declined    Attends Religious Services: Patient declined    Active Member of Clubs or Organizations: Patient declined    Attends Banker Meetings: Not on file    Marital Status: Patient declined  Depression (PHQ2-9): Low Risk (10/22/2024)   Depression (PHQ2-9)    PHQ-2 Score: 1  Alcohol Screen: Not on file  Housing: Unknown (04/17/2024)   Epic    Unable to Pay for Housing in the Last Year: Patient declined    Number of Times Moved in the Last Year: 0    Homeless in the Last Year: No  Utilities: Not At Risk (02/04/2023)   Received from [facility] Utilities    Threatened with loss of utilities: No  Health Literacy: Not on file    Allergies[1]  Outpatient Medications Prior to Visit  Medication Sig Dispense Refill   Cyanocobalamin  (B-12) 2000 MCG TABS Take 2,000 mcg by mouth daily. 30 tablet 3   Erenumab -aooe (AIMOVIG ) 140 MG/ML SOAJ ADMINISTER 1 ML UNDER THE SKIN EVERY 30 DAYS 1 mL 11   fentaNYL  (DURAGESIC  - DOSED MCG/HR) 50 MCG/HR Place 1 patch (50 mcg total) onto the skin every 3 (three) days. 5 patch 0   folic acid  (FOLVITE ) 1 MG tablet Take 1 tablet (1 mg total) by mouth daily. 90 tablet 3   hydroxychloroquine  (PLAQUENIL ) 200 MG tablet Take 200-400 mg by mouth See admin instructions. Take 200 mg by mouth every other day alternating with 400 mg (2 tablets) every other day     levETIRAcetam  (KEPPRA ) 500 MG tablet TAKE 1 TABLET(500 MG) BY MOUTH TWICE DAILY 180 tablet 3   mycophenolate  (MYFORTIC ) 360 MG TBEC EC tablet Take 360-720 mg by mouth See admin instructions. Take 720mg  in the am and 360mg  in pm     Naloxone  HCl 0.4 MG/0.4ML SOAJ Inject 0.4 mLs into the muscle as needed (overdose).     OXYGEN Inhale 2 L/min into the lungs at bedtime.     polyethylene glycol (MIRALAX  / GLYCOLAX ) packet Take 17 g by  mouth daily as needed for mild constipation or moderate constipation. 14 each 0   predniSONE  (DELTASONE ) 2.5 MG tablet Take 2.5 mg by mouth daily with breakfast.     pregabalin  (LYRICA ) 300 MG capsule Take 300 mg by mouth 2 (two) times daily.     Rimegepant Sulfate (NURTEC) 75 MG TBDP Take 1 tablet at the onset of migraine. Only 1 tablet in 24 hours. 8 tablet 11   SYMBICORT 80-4.5 MCG/ACT inhaler Inhale 2 puffs into the lungs 2 (two) times daily.   1   topiramate  (TOPAMAX ) 100 MG tablet Take 1 tablet (100 mg total) by mouth daily. 90 tablet 3   traMADol  (ULTRAM ) 50 MG tablet Take 1-2 tablets (50-100 mg total) by mouth every 4 (four) hours as needed (cough). (Patient taking differently: Take 50-100 mg by mouth every 6 (six) hours as needed for moderate pain.) 84 tablet 0   furosemide  (LASIX ) 20 MG tablet TAKE 1 TABLET BY MOUTH 3 DAYS A WEEK AS NEEDED FOR PEDAL EDEMA. DO NOT TAKE IF BLOOD PRESSURE IS LESS THAN 110/70 30 tablet 1   midodrine  (PROAMATINE ) 5 MG tablet Take 1 tablet (5 mg total) by mouth 3 (three) times daily with meals. 90 tablet 1   pantoprazole  (PROTONIX ) 40 MG tablet Take 1 tablet (40 mg total) by mouth 2 (two) times daily. 60 tablet 0   rosuvastatin  (CRESTOR ) 20 MG tablet TAKE 1 TABLET(20 MG) BY MOUTH DAILY 90 tablet 1   ranitidine (ZANTAC) 150 MG capsule Take 150 mg by mouth 2 (two) times daily.     Vitamin D , Ergocalciferol , (DRISDOL ) 50000 units CAPS capsule Take 1 capsule (50,000 Units total) by mouth every 7 (seven) days. (Patient taking differently: Take 50,000 Units by mouth every Saturday.) 12 capsule 3   cefdinir  (OMNICEF ) 300 MG capsule Take 1 capsule (300 mg total) by mouth 2 (two) times daily. 20 capsule 0   etodolac  (LODINE ) 500 MG tablet Take 1 tablet (500 mg total) by mouth 2 (two) times daily. (Patient not taking: Reported on 10/22/2024) 30 tablet 0   fluconazole  (DIFLUCAN ) 150 MG tablet Take 1 tablet (150 mg total) by mouth daily. 7 tablet 0   No facility-administered  medications prior to visit.     ROS Review of Systems  Constitutional:  Negative for activity change and appetite change.  HENT:  Negative for sinus pressure and sore throat.   Respiratory:  Negative for chest tightness, shortness of breath and wheezing.   Cardiovascular:  Positive for leg swelling. Negative for chest pain and palpitations.  Gastrointestinal:  Negative for abdominal distention, abdominal pain and constipation.  Genitourinary: Negative.   Musculoskeletal:  Positive for arthralgias.  Psychiatric/Behavioral:  Negative for behavioral problems and dysphoric mood.     Objective:  BP 134/83   Pulse (!) 57   Ht 5' 1 (1.549 m)   Wt 155 lb 12.8 oz (70.7 kg)   SpO2 98%   BMI 29.44 kg/m      10/22/2024    1:53 PM 04/21/2024    2:36 PM 12/13/2022    4:31 PM  BP/Weight  Systolic BP 134 127 80  Diastolic BP 83 84 60  Wt. (Lbs) 155.8 158.6 156.2  BMI 29.44 kg/m2 29.97 kg/m2 29.51 kg/m2      Physical Exam Constitutional:      Appearance: She is well-developed.  Cardiovascular:     Rate and Rhythm: Bradycardia present.     Heart sounds: Normal heart sounds. No murmur heard. Pulmonary:     Effort: Pulmonary effort is normal.     Breath sounds: Normal breath sounds. No wheezing or rales.  Chest:     Chest wall: No tenderness.  Abdominal:     General: Bowel sounds are normal. There is no distension.     Palpations: Abdomen is soft. There is no mass.     Tenderness: There is no abdominal tenderness.  Musculoskeletal:        General: Swelling present. Normal range of motion.     Right lower leg: No edema.     Left lower leg: No edema.  Neurological:     Mental Status: She is alert and oriented to person, place, and time.  Psychiatric:        Mood and Affect: Mood normal.        Latest Ref Rng & Units 12/13/2022    4:56 PM 11/22/2021   11:17 AM 06/14/2020    1:49 PM  CMP  Glucose 70 - 99 mg/dL 898  93  81   BUN 8 - 27 mg/dL 12  11  7    Creatinine 0.57 - 1.00  mg/dL 8.69  8.79  8.99   Sodium 134 - 144 mmol/L 144  143  141   Potassium 3.5 - 5.2 mmol/L 3.9  3.8  3.6   Chloride 96 - 106 mmol/L 111  105  101   CO2 20 - 29 mmol/L 16  20  24    Calcium  8.7 - 10.3 mg/dL 8.7  8.9  9.1   Total Protein 6.0 - 8.5 g/dL 6.8  6.9  6.9   Total Bilirubin 0.0 - 1.2 mg/dL 0.3  0.4  0.6   Alkaline Phos 44 - 121 IU/L 298  272  173   AST 0 - 40 IU/L 15  17  13    ALT 0 - 32 IU/L 5  9  5      Lipid Panel     Component Value Date/Time   CHOL 250 (H) 11/22/2021 1117   TRIG 134 11/22/2021 1117   HDL 53 11/22/2021 1117   CHOLHDL 3.5 08/08/2018 1152   CHOLHDL 5.3 09/10/2014 1142   VLDL 36 09/10/2014 1142   LDLCALC 173 (H)  11/22/2021 1117    CBC    Component Value Date/Time   WBC 7.4 11/22/2021 1117   WBC 8.0 11/04/2018 1148   RBC 5.39 (H) 11/22/2021 1117   RBC 4.24 11/04/2018 1148   HGB 14.3 11/22/2021 1117   HCT 45.7 11/22/2021 1117   PLT 227 11/22/2021 1117   MCV 85 11/22/2021 1117   MCH 26.5 (L) 11/22/2021 1117   MCH 25.7 (L) 11/04/2018 1148   MCHC 31.3 (L) 11/22/2021 1117   MCHC 28.4 (L) 11/04/2018 1148   RDW 13.5 11/22/2021 1117   LYMPHSABS 2.9 11/22/2021 1117   MONOABS 0.8 10/02/2018 0319   EOSABS 0.1 11/22/2021 1117   BASOSABS 0.0 11/22/2021 1117    Lab Results  Component Value Date   HGBA1C 5.2 12/13/2022       Assessment & Plan Hypotension Chronic hypotension managed with midodrine . - Continue midodrine  for hypotension management.  Edema of lower extremities Chronic edema managed with furosemide . - Continue furosemide  for edema management.  Hyperlipidemia Previous high cholesterol levels. Requires updated blood work to assess current status. - Ordered blood work to assess cholesterol levels. - Refilled rosuvastatin  for hyperlipidemia management.  Gastroesophageal reflux disease with esophagitis Chronic GERD managed with pantoprazole . - Refilled pantoprazole  for GERD management.   Interstitial lung disease - Under  pulmonary care - Continue mycophenolate  and prednisone   Lupus - Stable Continue hydroxychloroquine   General Health Maintenance Due for flu and pneumonia vaccinations. Mammogram and ultrasound needed for breast cancer screening. Encounter for vaccine administration- Administered flu shot and pneumonia vaccine. - Ordered mammogram for breast cancer screening.      Meds ordered this encounter  Medications   furosemide  (LASIX ) 20 MG tablet    Sig: TAKE 1 TABLET BY MOUTH 3 DAYS A WEEK AS NEEDED FOR PEDAL EDEMA. DO NOT TAKE IF BLOOD PRESSURE IS LESS THAN 110/70    Dispense:  90 tablet    Refill:  1   midodrine  (PROAMATINE ) 5 MG tablet    Sig: Take 1 tablet (5 mg total) by mouth daily.    Dispense:  90 tablet    Refill:  1   pantoprazole  (PROTONIX ) 40 MG tablet    Sig: Take 1 tablet (40 mg total) by mouth 2 (two) times daily.    Dispense:  180 tablet    Refill:  1   rosuvastatin  (CRESTOR ) 20 MG tablet    Sig: Take 1 tablet (20 mg total) by mouth daily.    Dispense:  90 tablet    Refill:  1    ZERO refills remain on this prescription. Your patient is requesting advance approval of refills for this medication to PREVENT ANY MISSED DOSES    Follow-up: No follow-ups on file.       Corrina Sabin, MD, FAAFP. [facility] and [facility] [place], [place] [phone]   10/22/2024, 6:27 PM    [1]  Allergies Allergen Reactions   Shellfish Allergy Anaphylaxis   Other     Blood Product Refusal    Pregabalin  Swelling    Able to tolerate Lyrica  (brand name only)   Gabapentin  Rash   "

## 2024-10-22 NOTE — Patient Instructions (Signed)
 VISIT SUMMARY:  During your visit, we reviewed and managed your multiple medical conditions, including your interstitial lung disease, cervical lymph node tumors, lupus, chronic pain, low blood pressure, swelling, acid reflux, and high cholesterol. We also addressed your general health maintenance needs.  YOUR PLAN:  -HYPOTENSION: Hypotension is low blood pressure. Continue taking midodrine  as prescribed to manage your low blood pressure.  -EDEMA OF LOWER EXTREMITIES: Edema is swelling caused by fluid buildup. Continue taking furosemide  as prescribed to manage the swelling in your lower extremities.  -HYPERLIPIDEMIA: Hyperlipidemia is high cholesterol. We have ordered blood work to assess your current cholesterol levels and refilled your prescription for rosuvastatin  to manage your cholesterol.  -GASTROESOPHAGEAL REFLUX DISEASE WITH ESOPHAGITIS: GERD is a condition where stomach acid frequently flows back into the tube connecting your mouth and stomach, causing irritation. Continue taking pantoprazole  as prescribed to manage your acid reflux.  -GENERAL HEALTH MAINTENANCE: You received your flu shot and pneumonia vaccine today. We have also ordered a mammogram for breast cancer screening.  INSTRUCTIONS:  Please follow up with the ordered blood work to assess your cholesterol levels and schedule your mammogram. Continue taking your medications as prescribed. If you have any questions or concerns, please contact our office.
